# Patient Record
Sex: Female | Born: 1935 | Race: White | Hispanic: No | Smoking: Never smoker
Health system: Southern US, Community
[De-identification: ages and names within clinical notes are randomized; demographics above are authoritative.]

## PROBLEM LIST (undated history)

## (undated) DIAGNOSIS — N183 Chronic kidney disease, stage 3 unspecified (HCC): Principal | ICD-10-CM

## (undated) DIAGNOSIS — T18128A Food in esophagus causing other injury, initial encounter: Secondary | ICD-10-CM

## (undated) DIAGNOSIS — K209 Esophagitis, unspecified without bleeding: Secondary | ICD-10-CM

## (undated) DIAGNOSIS — E785 Hyperlipidemia, unspecified: Secondary | ICD-10-CM

## (undated) DIAGNOSIS — E039 Hypothyroidism, unspecified: Secondary | ICD-10-CM

## (undated) DIAGNOSIS — M159 Polyosteoarthritis, unspecified: Secondary | ICD-10-CM

## (undated) DIAGNOSIS — E1042 Type 1 diabetes mellitus with diabetic polyneuropathy: Principal | ICD-10-CM

## (undated) DIAGNOSIS — R1319 Other dysphagia: Secondary | ICD-10-CM

## (undated) DIAGNOSIS — M150 Primary generalized (osteo)arthritis: Secondary | ICD-10-CM

## (undated) DIAGNOSIS — F32A Depression, unspecified: Secondary | ICD-10-CM

## (undated) DIAGNOSIS — N189 Chronic kidney disease, unspecified: Secondary | ICD-10-CM

## (undated) DIAGNOSIS — Z9181 History of falling: Secondary | ICD-10-CM

## (undated) DIAGNOSIS — H269 Unspecified cataract: Secondary | ICD-10-CM

## (undated) DIAGNOSIS — E119 Type 2 diabetes mellitus without complications: Secondary | ICD-10-CM

## (undated) DIAGNOSIS — Z794 Long term (current) use of insulin: Secondary | ICD-10-CM

## (undated) DIAGNOSIS — I2510 Atherosclerotic heart disease of native coronary artery without angina pectoris: Secondary | ICD-10-CM

## (undated) DIAGNOSIS — G709 Myoneural disorder, unspecified: Secondary | ICD-10-CM

## (undated) DIAGNOSIS — T7840XA Allergy, unspecified, initial encounter: Secondary | ICD-10-CM

## (undated) DIAGNOSIS — Z9989 Dependence on other enabling machines and devices: Secondary | ICD-10-CM

## (undated) DIAGNOSIS — K219 Gastro-esophageal reflux disease without esophagitis: Secondary | ICD-10-CM

## (undated) DIAGNOSIS — D649 Anemia, unspecified: Secondary | ICD-10-CM

## (undated) DIAGNOSIS — IMO0001 Reserved for inherently not codable concepts without codable children: Secondary | ICD-10-CM

## (undated) DIAGNOSIS — M1990 Unspecified osteoarthritis, unspecified site: Secondary | ICD-10-CM

## (undated) DIAGNOSIS — R1310 Dysphagia, unspecified: Secondary | ICD-10-CM

## (undated) DIAGNOSIS — K2210 Ulcer of esophagus without bleeding: Secondary | ICD-10-CM

## (undated) DIAGNOSIS — K559 Vascular disorder of intestine, unspecified: Secondary | ICD-10-CM

## (undated) DIAGNOSIS — I10 Essential (primary) hypertension: Secondary | ICD-10-CM

## (undated) DIAGNOSIS — F329 Major depressive disorder, single episode, unspecified: Secondary | ICD-10-CM

## (undated) DIAGNOSIS — K259 Gastric ulcer, unspecified as acute or chronic, without hemorrhage or perforation: Secondary | ICD-10-CM

## (undated) HISTORY — PX: THYROIDECTOMY: SHX17

## (undated) HISTORY — DX: Reserved for inherently not codable concepts without codable children: IMO0001

## (undated) HISTORY — DX: Vascular disorder of intestine, unspecified: K55.9

## (undated) HISTORY — DX: Hyperlipidemia, unspecified: E78.5

## (undated) HISTORY — DX: Allergy, unspecified, initial encounter: T78.40XA

## (undated) HISTORY — PX: CHOLECYSTECTOMY: SHX55

## (undated) HISTORY — DX: Dysphagia, unspecified: R13.10

## (undated) HISTORY — DX: Depression, unspecified: F32.A

## (undated) HISTORY — DX: Gastric ulcer, unspecified as acute or chronic, without hemorrhage or perforation: K25.9

## (undated) HISTORY — DX: Ulcer of esophagus without bleeding: K22.10

## (undated) HISTORY — DX: Anemia, unspecified: D64.9

## (undated) HISTORY — DX: Unspecified cataract: H26.9

## (undated) HISTORY — DX: Other dysphagia: R13.19

## (undated) HISTORY — DX: Long term (current) use of insulin: Z79.4

## (undated) HISTORY — PX: CATARACT EXTRACTION, BILATERAL: SHX1313

## (undated) HISTORY — PX: ABDOMINAL HYSTERECTOMY: SUR658

## (undated) HISTORY — DX: Myoneural disorder, unspecified: G70.9

## (undated) HISTORY — DX: Atherosclerotic heart disease of native coronary artery without angina pectoris: I25.10

## (undated) HISTORY — PX: UPPER GASTROINTESTINAL ENDOSCOPY: SHX188

## (undated) HISTORY — DX: Type 2 diabetes mellitus without complications: E11.9

## (undated) HISTORY — DX: Major depressive disorder, single episode, unspecified: F32.9

## (undated) HISTORY — PX: GALLBLADDER SURGERY: SHX652

## (undated) HISTORY — DX: Unspecified osteoarthritis, unspecified site: M19.90

## (undated) HISTORY — DX: Essential (primary) hypertension: I10

## (undated) HISTORY — DX: Dependence on other enabling machines and devices: Z99.89

## (undated) HISTORY — DX: Hypothyroidism, unspecified: E03.9

## (undated) HISTORY — DX: History of falling: Z91.81

---

## 2008-01-16 HISTORY — PX: COLONOSCOPY: SHX174

## 2009-11-01 LAB — LIPID PANEL
Chol/HDL Ratio: 3.6 (ref <5.0)
Cholesterol: 213 mg/dL — ABNORMAL HIGH (ref <200)
HDL: 59 mg/dL (ref >40)
LDL Cholesterol: 123 mg/dL — ABNORMAL HIGH (ref <100)
Triglycerides: 157 mg/dL — ABNORMAL HIGH (ref <150)

## 2009-11-01 LAB — HEMOGLOBIN A1C
Estimated Avg Glucose: 194 mg/dL
Hemoglobin A1C: 8.4 % — ABNORMAL HIGH (ref 4.0–6.0)

## 2009-11-01 LAB — ALT: ALT: 14 U/L (ref 4–40)

## 2009-11-01 LAB — AST: AST: 16 U/L (ref 8–36)

## 2009-12-07 LAB — LIPID PANEL
Chol/HDL Ratio: 3.7 (ref <5.0)
Cholesterol: 250 mg/dL — ABNORMAL HIGH (ref <200)
HDL: 67 mg/dL (ref >40)
LDL Cholesterol: 147 mg/dL — ABNORMAL HIGH (ref <100)
Triglycerides: 181 mg/dL — ABNORMAL HIGH (ref <150)

## 2009-12-07 LAB — AST: AST: 18 U/L (ref 8–36)

## 2009-12-07 LAB — ALT: ALT: 14 U/L (ref 4–40)

## 2009-12-08 LAB — HEMOGLOBIN A1C
Estimated Avg Glucose: 171 mg/dL
Hemoglobin A1C: 7.6 % — ABNORMAL HIGH (ref 4.0–6.0)

## 2010-07-18 LAB — TSH: TSH: 0.68 mIU/L (ref 0.35–5.50)

## 2010-07-18 LAB — MICROALBUMIN, UR
Creatinine, Ur: 75.8 mg/dL
Microalb, Ur: <12 mg/L (ref 0–19)
Microalb/Crt. Ratio: UNDETERMINED mcg/mg creat

## 2010-07-18 LAB — COMPREHENSIVE METABOLIC PANEL
ALT: 22 U/L (ref 4–40)
AST: 28 U/L (ref 8–36)
Albumin/Globulin Ratio: 1.7 (ref 1.0–2.7)
Albumin: 4.6 g/dL (ref 3.4–4.8)
Alkaline Phosphatase: 71 U/L (ref 25–100)
Anion Gap: 15 mmol/L (ref 8–16)
BUN: 21 mg/dL — ABNORMAL HIGH (ref 6–20)
CO2: 26 mmol/L (ref 20–31)
Calcium: 9.4 mg/dL (ref 8.6–10.4)
Chloride: 105 mmol/L (ref 98–110)
Creatinine: 1.16 mg/dL — ABNORMAL HIGH (ref 0.4–1.0)
GFR African American: 55 mL/min — ABNORMAL LOW (ref >60)
GFR Non-African American: 45 mL/min — ABNORMAL LOW (ref >60)
Glucose: 181 mg/dL — ABNORMAL HIGH (ref 74–106)
Potassium: 4.9 mmol/L (ref 3.5–5.1)
Protein, Total: 7.3 g/dL (ref 6.4–8.3)
Sodium: 141 mmol/L (ref 136–145)
Total Bilirubin: 0.45 mg/dL (ref 0.3–1.2)

## 2010-07-18 LAB — LIPID PANEL
Chol/HDL Ratio: 4.7 (ref <5.0)
Cholesterol: 252 mg/dL — ABNORMAL HIGH (ref <200)
HDL: 54 mg/dL (ref >40)
LDL Cholesterol: 156 mg/dL — ABNORMAL HIGH (ref <100)
Triglycerides: 212 mg/dL — ABNORMAL HIGH (ref <150)

## 2010-07-18 LAB — CBC
Hematocrit: 38.6 % (ref 36–46)
Hemoglobin: 12.9 g/dL (ref 12.0–16.0)
MCH: 30.4 pg (ref 26–34)
MCHC: 33.5 g/dL (ref 31–37)
MCV: 90.7 fL (ref 80–100)
MPV: 10 fL (ref 6.0–12.0)
Platelets: 256 10*3/uL (ref 140–450)
RBC: 4.26 m/uL (ref 4.0–5.2)
RDW: 13.5 % (ref 12.5–15.4)
WBC: 7.9 10*3/uL (ref 3.5–11.0)

## 2010-07-18 LAB — T4, FREE: Thyroxine, Free: 1.23 ng/dL (ref 0.80–2.00)

## 2010-07-19 LAB — HEMOGLOBIN A1C
Estimated Avg Glucose: 177 mg/dL
Hemoglobin A1C: 7.8 % — ABNORMAL HIGH (ref 4.0–6.0)

## 2011-01-22 LAB — HEPATIC FUNCTION PANEL
ALT: 22 U/L (ref 4–40)
AST: 21 U/L (ref 8–36)
Albumin: 4.5 g/dL (ref 3.4–4.8)
Alkaline Phosphatase: 82 U/L (ref 25–100)
Bilirubin, Direct: 0.11 mg/dL (ref 0.0–0.3)
Bilirubin, Indirect: 0.24 mg/dL (ref 0.0–1.0)
Protein, Total: 7.3 g/dL (ref 6.4–8.3)
Total Bilirubin: 0.35 mg/dL (ref 0.3–1.2)

## 2011-01-23 LAB — HEMOGLOBIN A1C
Estimated Avg Glucose: 177 mg/dL
Hemoglobin A1C: 7.8 % — ABNORMAL HIGH (ref 4.0–6.0)

## 2011-04-04 ENCOUNTER — Inpatient Hospital Stay
Admit: 2011-04-04 | Disposition: A | Discharge status: Home / Self Care | Attending: Internal Medicine | Admitting: Internal Medicine

## 2011-04-04 LAB — CBC WITH AUTO DIFFERENTIAL
Absolute Bands #: 4.55 10*3/uL — ABNORMAL HIGH (ref 0.0–1.0)
Absolute Eos #: 0 10*3/uL (ref 0.0–0.4)
Absolute Lymph #: 1.45 10*3/uL (ref 1.0–4.8)
Absolute Mono #: 0.62 10*3/uL (ref 0.1–1.3)
Absolute Neut #: 14.08 10*3/uL — ABNORMAL HIGH (ref 1.3–9.1)
Bands: 22 % — ABNORMAL HIGH (ref 0–10)
Basophils Absolute: 0 10*3/uL (ref 0.0–0.2)
Basophils: 0 % (ref 0–2)
Eosinophils %: 0 % (ref 0–4)
Hematocrit: 40 % (ref 36–46)
Hemoglobin: 12.8 g/dL (ref 12.0–16.0)
Lymphocytes: 7 % — ABNORMAL LOW (ref 24–44)
MCH: 29.5 pg (ref 26–34)
MCHC: 32.1 g/dL (ref 31–37)
MCV: 92.1 fL (ref 80–100)
MPV: 10.3 fL (ref 6.0–12.0)
Monocytes: 3 % (ref 1–7)
Morphology: NORMAL
Platelets: 255 10*3/uL (ref 150–450)
RBC: 4.35 m/uL (ref 4.0–5.2)
RDW: 13.8 % (ref 11.5–14.9)
Seg Neutrophils: 68 % — ABNORMAL HIGH (ref 36–66)
WBC: 20.7 10*3/uL — ABNORMAL HIGH (ref 3.5–11.0)

## 2011-04-04 LAB — BASIC METABOLIC PANEL
Anion Gap: 13 mmol/L (ref 8–16)
BUN: 41 mg/dL — ABNORMAL HIGH (ref 6–20)
CO2: 22 mmol/L (ref 20–31)
Calcium: 7.5 mg/dL — ABNORMAL LOW (ref 8.6–10.4)
Chloride: 103 mmol/L (ref 98–110)
Creatinine: 1.94 mg/dL — ABNORMAL HIGH (ref 0.4–1.0)
GFR African American: 30 mL/min — ABNORMAL LOW (ref >60)
GFR Non-African American: 25 mL/min — ABNORMAL LOW (ref >60)
Glucose: 437 mg/dL (ref 74–106)
Potassium: 5.1 mmol/L (ref 3.5–5.1)
Sodium: 133 mmol/L — ABNORMAL LOW (ref 136–145)

## 2011-04-04 LAB — BUN & CREATININE
BUN: 43 mg/dL — ABNORMAL HIGH (ref 6–20)
Creatinine: 2.31 mg/dL — ABNORMAL HIGH (ref 0.4–1.0)
GFR African American: 25 mL/min — ABNORMAL LOW (ref >60)
GFR Non-African American: 20 mL/min — ABNORMAL LOW (ref >60)

## 2011-04-04 LAB — HEPATIC FUNCTION PANEL
ALT: 27 U/L (ref 4–40)
AST: 30 U/L (ref 8–36)
Albumin: 4.2 g/dL (ref 3.4–4.8)
Alkaline Phosphatase: 67 U/L (ref 25–100)
Bilirubin, Direct: 0.24 mg/dL (ref 0.0–0.3)
Bilirubin, Indirect: 0.5 mg/dL
Protein, Total: 6.9 g/dL (ref 6.4–8.3)
Total Bilirubin: 0.74 mg/dL (ref 0.3–1.2)

## 2011-04-04 LAB — LIPASE: Lipase: 18 U/L (ref 5.6–51.3)

## 2011-04-04 LAB — ELECTROLYTE PANEL
Anion Gap: 19 mmol/L — ABNORMAL HIGH (ref 8–16)
CO2: 19 mmol/L — ABNORMAL LOW (ref 20–31)
Chloride: 92 mmol/L — ABNORMAL LOW (ref 98–110)
Potassium: 6.1 mmol/L (ref 3.5–5.1)
Sodium: 124 mmol/L — ABNORMAL LOW (ref 136–145)

## 2011-04-04 LAB — AMYLASE: Amylase: 16 U/L — ABNORMAL LOW (ref 20–104)

## 2011-04-04 LAB — GLUCOSE, RANDOM: Glucose: 614 mg/dL (ref 74–106)

## 2011-04-04 LAB — TROP/MYOGLOBIN
Myoglobin: 220 ng/mL — ABNORMAL HIGH (ref 0–110)
Troponin I: 0.01 ng/mL (ref 0.00–0.04)

## 2011-04-04 LAB — POC GLUCOSE FINGERSTICK: Glucose, Whole Blood: 422 mg/dL (ref 65–105)

## 2011-04-05 LAB — POC GLUCOSE FINGERSTICK
Glucose, Whole Blood: 172 mg/dL — ABNORMAL HIGH (ref 65–105)
Glucose, Whole Blood: 292 mg/dL — ABNORMAL HIGH (ref 65–105)
Glucose, Whole Blood: 231 mg/dL — ABNORMAL HIGH (ref 65–105)
Glucose, Whole Blood: 292 mg/dL — ABNORMAL HIGH (ref 65–105)

## 2011-04-05 LAB — CBC WITH AUTO DIFFERENTIAL
Absolute Eos #: 0.1 10*3/uL (ref 0.0–0.4)
Absolute Lymph #: 1.9 10*3/uL (ref 1.0–4.8)
Absolute Mono #: 1.8 10*3/uL — ABNORMAL HIGH (ref 0.1–1.3)
Absolute Neut #: 10.1 10*3/uL — ABNORMAL HIGH (ref 1.3–9.1)
Basophils Absolute: 0 10*3/uL (ref 0.0–0.2)
Basophils: 0 % (ref 0–2)
Eosinophils %: 1 % (ref 0–4)
Hematocrit: 30.4 % — ABNORMAL LOW (ref 36–46)
Hemoglobin: 10 g/dL — ABNORMAL LOW (ref 12.0–16.0)
Lymphocytes: 14 % — ABNORMAL LOW (ref 24–44)
MCH: 30.1 pg (ref 26–34)
MCHC: 32.8 g/dL (ref 31–37)
MCV: 91.7 fL (ref 80–100)
MPV: 9.4 fL (ref 6.0–12.0)
Monocytes: 13 % — ABNORMAL HIGH (ref 1–7)
Platelets: 163 10*3/uL (ref 150–450)
RBC: 3.32 m/uL — ABNORMAL LOW (ref 4.0–5.2)
RDW: 13.7 % (ref 11.5–14.9)
Seg Neutrophils: 72 % — ABNORMAL HIGH (ref 36–66)
WBC: 13.8 10*3/uL — ABNORMAL HIGH (ref 3.5–11.0)

## 2011-04-05 LAB — BASIC METABOLIC PANEL
Anion Gap: 12 mmol/L (ref 8–16)
BUN: 47 mg/dL — ABNORMAL HIGH (ref 6–20)
CO2: 21 mmol/L (ref 20–31)
Calcium: 7.1 mg/dL — ABNORMAL LOW (ref 8.6–10.4)
Chloride: 103 mmol/L (ref 98–110)
Creatinine: 2.1 mg/dL — ABNORMAL HIGH (ref 0.4–1.0)
GFR African American: 27 mL/min — ABNORMAL LOW (ref >60)
GFR Non-African American: 23 mL/min — ABNORMAL LOW (ref >60)
Glucose: 240 mg/dL — ABNORMAL HIGH (ref 74–106)
Potassium: 4.8 mmol/L (ref 3.5–5.1)
Sodium: 131 mmol/L — ABNORMAL LOW (ref 136–145)

## 2011-04-05 LAB — PHOSPHORUS: Phosphorus: 2.9 mg/dL (ref 2.5–4.5)

## 2011-04-05 LAB — MAGNESIUM: Magnesium: 2.2 mg/dL (ref 1.5–2.5)

## 2011-04-05 LAB — LACTIC ACID: Lactic Acid: 1.9 mmol/L (ref 0.7–2.1)

## 2011-04-05 LAB — CALCIUM IONIZED SERUM: Calcium, Ionized: 3.94 mg/dL — ABNORMAL LOW (ref 4.50–5.40)

## 2011-04-06 LAB — C. DIFFICILE TOXIN MOLECULAR

## 2011-04-06 LAB — BASIC METABOLIC PANEL
Anion Gap: 7 mmol/L — ABNORMAL LOW (ref 8–16)
BUN: 42 mg/dL — ABNORMAL HIGH (ref 6–20)
CO2: 23 mmol/L (ref 20–31)
Calcium: 7.2 mg/dL — ABNORMAL LOW (ref 8.6–10.4)
Chloride: 110 mmol/L (ref 98–110)
Creatinine: 1.31 mg/dL — ABNORMAL HIGH (ref 0.4–1.0)
GFR African American: 47 mL/min — ABNORMAL LOW (ref >60)
GFR Non-African American: 39 mL/min — ABNORMAL LOW (ref >60)
Glucose: 205 mg/dL — ABNORMAL HIGH (ref 74–106)
Potassium: 4.6 mmol/L (ref 3.5–5.1)
Sodium: 135 mmol/L — ABNORMAL LOW (ref 136–145)

## 2011-04-06 LAB — CBC
Hematocrit: 28.9 % — ABNORMAL LOW (ref 36–46)
Hemoglobin: 9.6 g/dL — ABNORMAL LOW (ref 12.0–16.0)
MCH: 30.5 pg (ref 26–34)
MCHC: 33.1 g/dL (ref 31–37)
MCV: 92.1 fL (ref 80–100)
MPV: 9.4 fL (ref 6.0–12.0)
Platelets: 134 10*3/uL — ABNORMAL LOW (ref 150–450)
RBC: 3.14 m/uL — ABNORMAL LOW (ref 4.0–5.2)
RDW: 14 % (ref 11.5–14.9)
WBC: 9 10*3/uL (ref 3.5–11.0)

## 2011-04-06 LAB — POC GLUCOSE FINGERSTICK
Glucose, Whole Blood: 273 mg/dL — ABNORMAL HIGH (ref 65–105)
Glucose, Whole Blood: 234 mg/dL — ABNORMAL HIGH (ref 65–105)
Glucose, Whole Blood: 204 mg/dL — ABNORMAL HIGH (ref 65–105)
Glucose, Whole Blood: 210 mg/dL — ABNORMAL HIGH (ref 65–105)

## 2011-04-06 LAB — VITAMIN B12 & FOLATE
Folate: 12.8 ug/L (ref 5.38–24.00)
Vitamin B-12: 699 ng/L (ref 211–911)

## 2011-04-06 LAB — FECAL LEUKOCYTES: Direct Exam: NONE SEEN — AB

## 2011-04-06 LAB — IRON AND TIBC
Iron Saturation: 2 % — ABNORMAL LOW (ref 20–55)
Iron: 4 ug/dL — ABNORMAL LOW (ref 50–170)
TIBC: 245 ug/dL (ref 240–450)

## 2011-04-06 LAB — VITAMIN D 25 HYDROXY: Vit D, 25-Hydroxy: 29 ng/mL — ABNORMAL LOW (ref 30–100)

## 2011-04-06 LAB — RETICULOCYTES
Absolute Retic #: 0.049 M/uL (ref 0.0245–0.098)
Retic %: 1.5 % (ref 0.5–2.0)

## 2011-04-06 LAB — MAGNESIUM: Magnesium: 2.2 mg/dL (ref 1.5–2.5)

## 2011-04-06 LAB — FERRITIN: Ferritin: 99 ug/L (ref 10–291)

## 2011-04-06 LAB — PERIPHERAL BLOOD SMEAR, PATH REVIEW

## 2011-04-07 LAB — CBC
Hematocrit: 30.1 % — ABNORMAL LOW (ref 36–46)
Hemoglobin: 10.1 g/dL — ABNORMAL LOW (ref 12.0–16.0)
MCH: 30.7 pg (ref 26–34)
MCHC: 33.7 g/dL (ref 31–37)
MCV: 91.2 fL (ref 80–100)
MPV: 9.4 fL (ref 6.0–12.0)
Platelets: 165 10*3/uL (ref 150–450)
RBC: 3.3 m/uL — ABNORMAL LOW (ref 4.0–5.2)
RDW: 13.7 % (ref 11.5–14.9)
WBC: 9.6 10*3/uL (ref 3.5–11.0)

## 2011-04-07 LAB — BASIC METABOLIC PANEL
Anion Gap: 8 mmol/L (ref 8–16)
BUN: 19 mg/dL (ref 6–20)
CO2: 23 mmol/L (ref 20–31)
Calcium: 7.4 mg/dL — ABNORMAL LOW (ref 8.6–10.4)
Chloride: 112 mmol/L — ABNORMAL HIGH (ref 98–110)
Creatinine: 0.83 mg/dL (ref 0.4–1.0)
GFR African American: >60 mL/min (ref >60)
GFR Non-African American: >60 mL/min (ref >60)
Glucose: 137 mg/dL — ABNORMAL HIGH (ref 74–106)
Potassium: 4.2 mmol/L (ref 3.5–5.1)
Sodium: 139 mmol/L (ref 136–145)

## 2011-04-07 LAB — STOOL CULTURE: Culture: NEGATIVE — AB

## 2011-04-07 LAB — POC GLUCOSE FINGERSTICK
Glucose, Whole Blood: 186 mg/dL — ABNORMAL HIGH (ref 65–105)
Glucose, Whole Blood: 154 mg/dL — ABNORMAL HIGH (ref 65–105)
Glucose, Whole Blood: 144 mg/dL — ABNORMAL HIGH (ref 65–105)

## 2011-04-07 LAB — MAGNESIUM: Magnesium: 2 mg/dL (ref 1.5–2.5)

## 2011-04-09 LAB — VITAMIN D 1,25 DIHYDROXY: Vit D, 1,25-Dihydroxy: 44 pg/mL (ref 15–75)

## 2011-04-10 LAB — IBD PANEL

## 2011-04-17 LAB — HEMOGLOBIN A1C
Estimated Avg Glucose: 192 mg/dL
Hemoglobin A1C: 8.3 % — ABNORMAL HIGH (ref 4.0–6.0)

## 2011-08-29 NOTE — Progress Notes (Signed)
Subjective:      Patient ID: Kristen Gill is a 78 y.o. female.    HPI  This is an 78 year old patient seen in the office to evaluate and manage dysphagia.  On reviewing her records, it looks like the patient was seen by Dr. Geronimo Boot in March of 2013 at [facility] to evaluate colitis.  I did discuss with the patient that Dr. Geronimo Boot is available to see her and I encouraged her to see him given that he knows this patient in the last six months.  However, after discussion, the patient stated that she prefers to change the physician and she is comfortable with me.    On further discussion, it appears that when she eats solid food, the food gets stuck in the substernal area.  She has to drink plenty of liquids to get relief from this discomfort.  The patient has the symptoms in the last three to four months and this is progressively getting worse.  She denies epigastric pain or nausea.  No abdominal distention, bloating, etc.  She denies diarrhea at present.  In fact, the patient states that she has constipation and she has been taking stool softeners.  No weight loss.  She also denies GERD symptoms.  Her last EGD and dilatation appears to be three years ago.  Her other medical history reviewed.  Also to be noted, the patient does not have symptoms or diagnosis of scleroderma.  However, she does have diabetes and hypothyroidism.    Review of Systems   Constitutional: Negative for fever, activity change, appetite change and unexpected weight change.   HENT: Positive for trouble swallowing.    Respiratory: Negative for cough and shortness of breath.    Cardiovascular: Negative for chest pain and leg swelling.   Gastrointestinal: Positive for constipation and blood in stool. Negative for nausea, vomiting, abdominal pain, diarrhea and abdominal distention.   Genitourinary: Negative for flank pain and difficulty urinating.   Musculoskeletal: Negative for back pain.   Skin: Negative for pallor and rash.    Neurological: Negative for weakness and headaches.   Hematological: Negative for adenopathy. Does not bruise/bleed easily.   Psychiatric/Behavioral: The patient is not nervous/anxious.        Objective:   Physical Exam   Nursing note and vitals reviewed.  Constitutional: She appears well-nourished.   HENT:   Head: Normocephalic and atraumatic.   Eyes: Conjunctivae are normal. Pupils are equal, round, and reactive to light. No scleral icterus.   Neck: No tracheal deviation present.   Cardiovascular: Normal rate and regular rhythm.    No murmur heard.  Pulmonary/Chest: Effort normal. She has no wheezes. She has no rales.   Abdominal: Soft. Bowel sounds are normal. She exhibits no distension and no ascites. There is no hepatomegaly. There is no tenderness. No hernia.   Genitourinary: Guaiac negative stool.   Musculoskeletal: She exhibits no edema.   Lymphadenopathy:     She has no cervical adenopathy.   Neurological: She is alert.   Skin: No erythema.   Psychiatric: Thought content normal.       Assessment:      1. Dysphasia  PR UPPER GI ENDOSCOPY,DIAGNOSIS   2. Ischemic colitis               Plan:      Given the patient's history, past history of esophageal dilation, need to evaluate esophagus for the possibility of stricture, ulcers, or Barrett's mucosa.  The patient was explained these and  she requests the investigation.  This will be arranged.  Basing on the results, we will decide further management.At present colitis symptoms resoved . Has mild constipation. Medical management of constipation discussed.

## 2011-09-12 LAB — POC GLUCOSE FINGERSTICK: POC Glucose: 103 mg/dL (ref 65–105)

## 2011-09-13 LAB — SURGICAL PATHOLOGY

## 2011-09-13 MED ORDER — OMEPRAZOLE 20 MG PO CPDR
20 | ORAL_CAPSULE | Freq: Every day | ORAL | Status: DC
Start: 2011-09-13 — End: 2014-05-31

## 2011-09-13 NOTE — Telephone Encounter (Signed)
Per verbal order of Dr Murtis SinkPangulur prilosec order sent to pharmacy.

## 2011-09-26 LAB — COMPREHENSIVE METABOLIC PANEL
ALT: 20 U/L (ref 4–40)
AST: 17 U/L (ref 8–36)
Albumin/Globulin Ratio: 1.6 (ref 1.0–2.7)
Albumin: 4.4 g/dL (ref 3.4–4.8)
Alkaline Phosphatase: 74 U/L (ref 25–100)
Anion Gap: 14 mmol/L (ref 8–16)
BUN: 25 mg/dL — ABNORMAL HIGH (ref 6–20)
CO2: 29 mmol/L (ref 20–31)
Calcium: 9.5 mg/dL (ref 8.6–10.4)
Chloride: 104 mmol/L (ref 98–110)
Creatinine: 1.22 mg/dL — ABNORMAL HIGH (ref 0.4–1.0)
GFR African American: 51 mL/min — ABNORMAL LOW (ref >60)
GFR Non-African American: 42 mL/min — ABNORMAL LOW (ref >60)
Glucose: 243 mg/dL — ABNORMAL HIGH (ref 74–106)
Potassium: 4.9 mmol/L (ref 3.5–5.1)
Sodium: 142 mmol/L (ref 136–145)
Total Bilirubin: 0.51 mg/dL (ref 0.3–1.2)
Total Protein: 7.1 g/dL (ref 6.4–8.3)

## 2011-09-26 LAB — CBC
Hematocrit: 37.7 % (ref 36–46)
Hemoglobin: 12.6 g/dL (ref 12.0–16.0)
MCH: 29.2 pg (ref 26–34)
MCHC: 33.6 g/dL (ref 31–37)
MCV: 87.1 fL (ref 80–100)
MPV: 9.6 fL (ref 6.0–12.0)
Platelets: 236 10*3/uL (ref 140–450)
RBC: 4.32 m/uL (ref 4.0–5.2)
RDW: 14 % (ref 12.5–15.4)
WBC: 6.9 10*3/uL (ref 3.5–11.0)

## 2011-09-26 LAB — HEMOGLOBIN A1C
Estimated Avg Glucose: 183 mg/dL
Hemoglobin A1C: 8 % — ABNORMAL HIGH (ref 4.0–6.0)

## 2011-10-16 NOTE — Progress Notes (Signed)
Subjective:      Patient ID: Kristen Gill is a 78 y.o. female.    HPI   This is an 78 year old patient seen in the office with the symptoms of dysphagia.  To evaluate her symptoms, she had an EGD done and at that time she also had Maloney dilation of the esophagus up to 50 [place] dilator.  I am not able to document pathology in the esophagus.  Also, gastric biopsies and esophageal biopsies are unremarkable.  No evidence of eosinophilic esophagitis noted.  Following this procedure, at present the patient states that she is doing reasonably well.  She is able to swallow food fairly well.  Her GERD symptoms are better.  Denies abdominal pain.  She has satisfactory bowel movements.  No weight loss.    Review of Systems   Constitutional: Negative for fever, activity change, fatigue and unexpected weight change.   HENT: Negative for sore throat, trouble swallowing (better) and voice change.    Eyes: Negative.    Respiratory: Negative for cough, choking, shortness of breath and wheezing.    Cardiovascular: Negative for chest pain, palpitations and leg swelling.   Gastrointestinal: Negative for nausea, vomiting, abdominal pain and abdominal distention.   Genitourinary: Positive for flank pain. Negative for urgency, frequency, hematuria, vaginal bleeding, difficulty urinating and pelvic pain.   Musculoskeletal: Negative for back pain, joint swelling, arthralgias and gait problem.   Skin: Negative for color change, pallor and rash.   Neurological: Negative for dizziness, seizures, weakness, light-headedness and headaches.   Hematological: Negative for adenopathy. Does not bruise/bleed easily.   Psychiatric/Behavioral: Negative for confusion, sleep disturbance and agitation.       Objective:   Physical Exam   Nursing note and vitals reviewed.  Constitutional: She is oriented to person, place, and time. She appears well-developed and well-nourished.   HENT:   Head: Normocephalic and atraumatic.   No oral lesions   Eyes: No  scleral icterus.   Neck: Neck supple. No hepatojugular reflux and no JVD present. No thyromegaly present.   Cardiovascular: Normal rate, regular rhythm and normal heart sounds.    Pulmonary/Chest: Effort normal and breath sounds normal. She has no wheezes. She has no rales.   Abdominal: Soft. Bowel sounds are normal. She exhibits no distension and no mass. There is no tenderness.   Musculoskeletal: She exhibits no edema and no tenderness.   No joint swelling   Lymphadenopathy:     She has no cervical adenopathy.   Neurological: She is alert and oriented to person, place, and time.   Skin: Skin is warm. No bruising, no ecchymosis and no rash noted.       Assessment:      1. Abdominal pain, epigastric    2. Dysphagia    3. Gastric ulcer            Plan:      The patient was reassured.  Advised to continue PPI therapy and antireflux measures.  The patient is advised to see family physician for follow up.  If she has any further issues, she is advised to contact me.  The patient understood and agreed.

## 2012-01-26 LAB — LIPID PANEL
Chol/HDL Ratio: 3.6 (ref <5.0)
Cholesterol: 190 mg/dL (ref <200)
HDL: 53 mg/dL (ref >40)
LDL Cholesterol: 92 mg/dL (ref <100)
Triglycerides: 223 mg/dL — ABNORMAL HIGH (ref <150)

## 2012-01-26 LAB — TSH: TSH: 2.24 mIU/L (ref 0.35–5.50)

## 2012-01-26 LAB — T4, FREE: Thyroxine, Free: 1.18 ng/dL (ref 0.80–2.00)

## 2012-02-01 MED ORDER — AMITRIPTYLINE HCL 75 MG PO TABS
75 MG | ORAL_TABLET | Freq: Two times a day (BID) | ORAL | Status: DC
Start: 2012-02-01 — End: 2012-04-16

## 2012-02-01 MED ORDER — TRAMADOL HCL 50 MG PO TABS
50 MG | ORAL_TABLET | Freq: Four times a day (QID) | ORAL | Status: AC | PRN
Start: 2012-02-01 — End: 2012-03-02

## 2012-02-01 NOTE — Progress Notes (Signed)
[facility]  Internal Medicine History and Physical      Name: Kristen Gill  MRN: [id number]     PCP: Marjory Sneddon, CNP    Chief Complaint:     Chief Complaint   Patient presents with   . Check-Up     very fatigued lately, has arthritis in knees now       History Obtained From:     patient    History of Present Illness:      Kristen Gill is a Caucasian 79 y.o.  female who presents with OA knees. Stopped taking Diclofenac. Thought she called for refill, but nothing documented from pharmacy or in chart note. She has fatigue, but increased her elavil back to 75mg  bid for her depression and feels better on that dosage. Reviewed last labs. Encouraged DM diet. Encouraged getting TSH and HgbA1C checked. No fever, chills, no recent illness. Agreed to pheumovax and tdap today. No other pains, except chronic DM neuropathy. Remained on 32 units of NPH and does 10 units sliding scale regular insulin Humulin in the evening only. Does not consistently check blood sugars.     Location/Symptom: knee pain  Timing/Onset: chronic  Provocation: unsure  Quality: dull  Radiation: none  Severity: moderate  Timing/Duration: daily, waxes and wanes    Modifying Factors: diclofenac helped but stopped taking.    Past Medical History:     Past Medical History   Diagnosis Date   . IDDM (insulin dependent diabetes mellitus)    . Hyperlipidemia    . Hypertension    . Osteoarthritis    . Depression    . Hypothyroidism         Past Surgical History:     Past Surgical History   Procedure Laterality Date   . Hysterectomy     . Cholecystectomy     . Thyroidectomy     . Colonoscopy  04/14/2008     severe ischemic colitis   . Colonoscopy  06/03/2008     redundant colon, a lot of adhesions, poor prep, diverticula   . Upper gastrointestinal endoscopy  2010     dilation   . Upper gastrointestinal endoscopy  10/14/2008     esophageal lesion, biopsy shows inflammatory changes   . Upper gastrointestinal endoscopy  09/12/2011     small  ulcer upper part of stomach, reactive gastropathy        Medications Prior to Admission:       Prior to Admission medications    Medication Sig Start Date End Date Taking? Authorizing Provider   amitriptyline (ELAVIL) 75 MG tablet Take 1 tablet by mouth 2 times daily. 02/01/12  Yes Marjory Sneddon, CNP   aspirin 325 MG tablet Take 325 mg by mouth daily.   Yes Historical Provider, MD   ascorbic acid (VITAMIN C) 500 MG tablet Take 500 mg by mouth daily.   Yes Historical Provider, MD   Omega-3 Fatty Acids (OMEGA-3 FISH OIL PO) Take  by mouth.   Yes Historical Provider, MD   CALCIUM & MAGNESIUM CARBONATES PO Take  by mouth daily.   Yes Historical Provider, MD   ALOE VERA JUICE PO Take 2 oz by mouth 2 times daily.   Yes Historical Provider, MD   lisinopril (PRINIVIL;ZESTRIL) 10 MG tablet Take 10 mg by mouth daily.   Yes Historical Provider, MD   levothyroxine (SYNTHROID) 150 MCG tablet Take 150 mcg by mouth Daily.   Yes Historical Provider, MD   DICLOFENAC  POTASSIUM PO Take 75 mg by mouth daily.   Yes Historical Provider, MD   insulin regular (HUMULIN R;NOVOLIN R) 100 UNIT/ML injection Inject  into the skin 2 times daily (before meals).   Yes Historical Provider, MD   insulin NPH (HUMULIN N) 100 UNIT/ML injection Inject 32 Units into the skin 2 times daily (before meals).   Yes Historical Provider, MD   Lactobacillus CHEW Take 1 tablet by mouth 3 times daily.    Historical Provider, MD   Cyanocobalamin ([facility] B-12 PO) Take  by mouth.    Historical Provider, MD   omeprazole (PRILOSEC) 20 MG capsule Take 1 capsule by mouth Daily. 09/13/11   Marguerita Beards Pangulur, MD   simvastatin (ZOCOR) 40 MG tablet Take 40 mg by mouth nightly.    Historical Provider, MD        Allergies:       Pravastatin    Social History:     Tobacco:    reports that she has never smoked. She has never used smokeless tobacco.  Alcohol:      reports that she does not drink alcohol.  Drug Use:  reports that she does not use illicit drugs.    Family  History:     Family History   Problem Relation Age of Onset   . Other Mother      died at age 37 of old age   . Heart Disease Father    . Cancer Sister      lung   . Cancer Brother    . Birth Defects Brother      unknown site           Review of Systems:       Positive and Negative as described in HPI    Constitutional:  negative for  fevers, chills, sweats, positive for fatigue, no weight loss  HEENT:  negative for vision or hearing changes,   Respiratory:  negative for shortness of breath, cough, or congestion  Cardiovascular:  negative for  chest pain, palpitations, edema  Gastrointestinal:  negative for nausea, vomiting, diarrhea, constipation, abdominal pain  Genitourinary:  negative for frequency, dysuria  Integument/Breast:  negative for rash, skin lesions, wounds  Musculoskeletal:  negative for muscle aches or joint pain, strength equal  Neurological:  negative for headaches, dizziness, lightheadedness, numbness,positive for pain and tingling extremities secondary to DM 2  Behavior/Psych: positive for depression , no anxiety      Physical Exam:     Vitals:  BP 130/48  Pulse 78  Resp 16  Ht 5\' 5"  (1.651 m)  Wt 162 lb (73.483 kg)  BMI 26.96 kg/m2    General appearance - alert, well appearing, and in no acute distress  Mental status - alert, oriented to person, place, and time  Head - normocephalic and atraumatic  Eyes - pupils equal and reactive, extraocular eye movements intact, no sclera icterus, no nystagmus  Ears - hearing appears to be intact, TM's clear, no drainage  Nose - no drainage noted, mucous membranes moist  Mouth - mucous membranes moist, pharynx pink  Neck - supple, no carotid bruits, thyroid not palpable, no lymphadenopathy  Chest - clear to auscultation, no wheezes, rales or rhonchi  Heart - normal rate, regular rhythm, no murmurs, no gallops, no rubs  Abdomen - soft, nontender, nondistended, no masses or organomegaly  Neurological - alert, oriented, normal speech, no focal findings or  movement disorder noted, cranial nerves II through XII grossly intact  Extremities -  peripheral pulses palpable, no pedal edema or calf pain with palpation, no cyanosis, clubbing, or edema  Skin - no gross lesions or rashes, no open wounds      Data:     Reviewed labs    Assesment:     Primary Problem  Problem List Items Addressed This Visit    Depression (Chronic)    Relevant Medications       amitriptyline (ELAVIL) tablet    Hypothyroidism (Chronic)    CKD (chronic kidney disease) stage 3, GFR 30-59 ml/min (Chronic)    Hypertension      Other Visit Diagnoses    OA (osteoarthritis) of knee    -  Primary     Neuropathy associated with endocrine disorder         Fatigue         Need for pneumococcal vaccination         Need for Tdap vaccination               Plan:     1. Ok for tramadol prn if tylenol does not work for arthritis pain  2. Ok to continue elavil as she was doing originally, does not have any issues with increased dosage and no confusion with this  3. Encouraged patient to monitor blood sugars more carefully will recheck hgbA1C  4. Check TSH for hypothyroidism      Electronically signed by Marjory Sneddon, CNP on 02/01/2012 at 1:25 PM     Copy sent to Dr. Marjory Sneddon, CNP

## 2012-02-01 NOTE — Addendum Note (Signed)
Addended by: Kathrene Bongo, Tresa Endo on: 02/01/2012 04:20 PM     Modules accepted: Orders

## 2012-04-21 MED ORDER — TRAMADOL HCL 50 MG PO TABS
50 MG | ORAL_TABLET | Freq: Four times a day (QID) | ORAL | Status: DC | PRN
Start: 2012-04-21 — End: 2013-10-26

## 2012-04-21 MED ORDER — AMITRIPTYLINE HCL 75 MG PO TABS
75 MG | ORAL_TABLET | Freq: Two times a day (BID) | ORAL | Status: DC
Start: 2012-04-21 — End: 2012-09-25

## 2012-06-16 MED ORDER — LISINOPRIL 10 MG PO TABS
10 MG | ORAL_TABLET | ORAL | Status: DC
Start: 2012-06-16 — End: 2013-09-06

## 2012-08-01 MED ORDER — INSULIN REGULAR HUMAN 100 UNIT/ML IJ SOLN
100 UNIT/ML | INTRAMUSCULAR | Status: DC
Start: 2012-08-01 — End: 2015-08-26

## 2012-08-01 NOTE — Telephone Encounter (Signed)
Pharmacy requesting refill on Humulin R-will e-scribe

## 2012-08-21 LAB — TSH: TSH: 2.08 mIU/L (ref 0.30–5.00)

## 2012-08-22 LAB — HEMOGLOBIN A1C
Estimated Avg Glucose: 186 mg/dL
Hemoglobin A1C: 8.1 % — ABNORMAL HIGH (ref 4.0–6.0)

## 2012-08-26 NOTE — Progress Notes (Signed)
[facility]  Internal Medicine History and Physical      Name: Kristen Gill  MRN: [id number]     PCP: Marjory Sneddon, CNP    Chief Complaint:     Chief Complaint   Patient presents with   . Diabetes   . Hypertension   . Hyperlipidemia   . Knee Pain     R knee pain; had cortisone injection; it did not work; she is following up with [facility]       History Obtained From:     patient    History of Present Illness:      Kristen Gill is a Caucasian 79 y.o.  female who presents with routine follow up visit for DM2, HTN, HLD, DM neuropathy, R knee pain. She reports that she had a cortisone injection for the knee pain and it did not work well. She has an appointment to follow up with [facility] (ortho) for her knee. Discussed her diabetes and working on getting her HgbA1c down a little by adjusting medication, however, she is hesitant to change her routine for fear of hypoglycemia and declined a long-acting insulin. Her HTN is stable on medication, her HLD is stable on medication. She is taking elavil for her diabetic neuropathy and reports there is no worsening of symptoms. Denies CP, SOB, N.V.D. No fever, chills.       Past Medical History:     Past Medical History   Diagnosis Date   . IDDM (insulin dependent diabetes mellitus) (HCC)    . Hyperlipidemia    . Hypertension    . Osteoarthritis    . Depression    . Hypothyroidism         Past Surgical History:     Past Surgical History   Procedure Laterality Date   . Hysterectomy     . Cholecystectomy     . Thyroidectomy     . Colonoscopy  04/14/2008     severe ischemic colitis   . Colonoscopy  06/03/2008     redundant colon, a lot of adhesions, poor prep, diverticula   . Upper gastrointestinal endoscopy  2010     dilation   . Upper gastrointestinal endoscopy  10/14/2008     esophageal lesion, biopsy shows inflammatory changes   . Upper gastrointestinal endoscopy  09/12/2011     small ulcer upper part of stomach, reactive gastropathy        Medications  Prior to Admission:       Prior to Admission medications    Medication Sig Start Date End Date Taking? Authorizing Provider   insulin regular (HUMULIN R) 100 UNIT/ML injection Every evening with largest meal 08/01/12   Georjean Mode, CNP   lisinopril (PRINIVIL;ZESTRIL) 10 MG tablet TAKE 1 TABLET DAILY 06/14/12   Georjean Mode, CNP   amitriptyline (ELAVIL) 75 MG tablet Take 1 tablet by mouth 2 times daily. 04/16/12   Georjean Mode, CNP   traMADol (ULTRAM) 50 MG tablet Take 1 tablet by mouth every 6 hours as needed for Pain. 04/16/12   Georjean Mode, CNP   aspirin 325 MG tablet Take 325 mg by mouth daily.    Historical Provider, MD   ascorbic acid (VITAMIN C) 500 MG tablet Take 500 mg by mouth daily.    Historical Provider, MD   Omega-3 Fatty Acids (OMEGA-3 FISH OIL PO) Take  by mouth.    Historical Provider, MD   CALCIUM & MAGNESIUM CARBONATES PO Take  by mouth daily.  Historical Provider, MD   ALOE VERA JUICE PO Take 2 oz by mouth 2 times daily.    Historical Provider, MD   Lactobacillus CHEW Take 1 tablet by mouth 3 times daily.    Historical Provider, MD   Cyanocobalamin (CVS B-12 PO) Take  by mouth.    Historical Provider, MD   omeprazole (PRILOSEC) 20 MG capsule Take 1 capsule by mouth Daily. 09/13/11   Marguerita Beards Pangulur, MD   simvastatin (ZOCOR) 40 MG tablet Take 40 mg by mouth nightly.    Historical Provider, MD   levothyroxine (SYNTHROID) 150 MCG tablet Take 150 mcg by mouth Daily.    Historical Provider, MD   DICLOFENAC POTASSIUM PO Take 75 mg by mouth daily.    Historical Provider, MD   insulin NPH (HUMULIN N) 100 UNIT/ML injection Inject 32 Units into the skin 2 times daily (before meals).    Historical Provider, MD        Allergies:       Pravastatin    Social History:     Tobacco:    reports that she has never smoked. She has never used smokeless tobacco.  Alcohol:      reports that she does not drink alcohol.  Drug Use:  reports that she does not use illicit drugs.    Family History:     Family History    Problem Relation Age of Onset   . Other Mother      died at age 55 of old age   . Heart Disease Father    . Cancer Sister      lung   . Cancer Brother    . Birth Defects Brother      unknown site           Review of Systems:       Positive and Negative as described in HPI    Constitutional:  negative for  fevers, chills, sweats, fatigue, and weight loss  HEENT:  negative for vision or hearing changes,   Respiratory:  negative for shortness of breath, cough, or congestion  Cardiovascular:  negative for  chest pain, palpitations, edema  Gastrointestinal:  negative for nausea, vomiting, diarrhea, constipation, abdominal pain  Genitourinary:  negative for frequency, dysuria  Integument/Breast:  negative for rash, skin lesions, wounds  Musculoskeletal:  Positive for Right knee pain, strength equal  Neurological:  negative for headaches,chronic dizziness, lightheadedness, positive for chronic numbness, pain and tingling extremities  Behavior/Psych:  negative for depression and anxiety      Physical Exam:     Vitals:  BP 122/82  Pulse 72  Resp 20  Ht 5\' 4"  (1.626 m)  Wt 163 lb 9.6 oz (74.208 kg)  BMI 28.07 kg/m2  Breastfeeding? No    General appearance - alert, well appearing, and in no acute distress  Mental status - alert, oriented to person, place, and time  Head - normocephalic and atraumatic  Eyes - pupils equal and reactive, extraocular eye movements intact, no sclera icterus, no nystagmus  Ears - hearing appears to be intact, TM's clear, no drainage  Nose - no drainage noted, mucous membranes moist  Mouth - mucous membranes moist, pharynx pink  Neck - supple, no carotid bruits, thyroid not palpable, no lymphadenopathy  Chest - clear to auscultation, no wheezes, rales or rhonchi  Heart - normal rate, regular rhythm, no murmurs, no gallops, no rubs  Abdomen - soft, nontender, nondistended, no masses or organomegaly  Neurological - alert, oriented, normal  speech, no focal findings or movement disorder noted,  cranial nerves II through XII grossly intact  Extremities - peripheral pulses palpable, no pedal edema or calf pain with palpation, no cyanosis, clubbing, or edema  Skin - no gross lesions or rashes, no open wounds      Data:     Reviewed labs; hgb A1C 8.1    Assesment:     Primary Problem  Problem List Items Addressed This Visit    IDDM (insulin dependent diabetes mellitus) (HCC) - Primary (Chronic)    Osteoarthritis (Chronic)    Hypothyroidism (Chronic)    Hyperlipidemia    Hypertension      Other Visit Diagnoses    Knee pain, chronic, right         DM neuropathy, type II diabetes mellitus (HCC)               Plan:     1. Discussed watching diet and keeping log; avoiding concentrated sweets for better DM control  2. Refills for medications  3. Follow up with ortho (has appointment already)  4. Encouraged regular skin, eye, foot exams.   5. Discussed checking HgbA1c in about 3-4 months      Electronically signed by Marjory Sneddon, CNP on 08/26/2012 at 7:51 PM     Copy sent to Dr. Marjory Sneddon, CNP

## 2012-09-16 MED ORDER — SIMVASTATIN 40 MG PO TABS
40 MG | ORAL_TABLET | Freq: Every evening | ORAL | Status: DC
Start: 2012-09-16 — End: 2013-09-11

## 2012-09-16 NOTE — Telephone Encounter (Signed)
Pharmacy requests refill of Zocor-will e-scribe

## 2012-09-26 MED ORDER — AMITRIPTYLINE HCL 75 MG PO TABS
75 MG | ORAL_TABLET | ORAL | Status: DC
Start: 2012-09-26 — End: 2013-06-22

## 2013-03-10 NOTE — Telephone Encounter (Signed)
Please see health maintenance letter to patient

## 2013-04-09 MED ORDER — BD INSULIN SYRINGE U/F 30G X 1/2" 1 ML MISC
Status: DC
Start: 2013-04-09 — End: 2013-06-15

## 2013-06-15 MED ORDER — INSULIN SYRINGE-NEEDLE U-100 30G X 1/2" 1 ML MISC
Freq: Two times a day (BID) | Status: DC
Start: 2013-06-15 — End: 2013-09-13

## 2013-06-15 NOTE — Telephone Encounter (Signed)
Pharmacy requests refill of insulin syringes-will e-scribe.

## 2013-06-22 MED ORDER — AMITRIPTYLINE HCL 75 MG PO TABS
75 MG | ORAL_TABLET | ORAL | Status: DC
Start: 2013-06-22 — End: 2013-10-26

## 2013-08-28 NOTE — Telephone Encounter (Signed)
Last seen Aug 2014

## 2013-09-02 NOTE — Telephone Encounter (Signed)
Letter mailed to pt to schedule appt

## 2013-09-02 NOTE — Telephone Encounter (Signed)
msg left on pt v/m to return our call to schedule appt for medication refill

## 2013-09-03 MED ORDER — INSULIN NPH (HUMAN) (ISOPHANE) 100 UNIT/ML SC SUSP
100 UNIT/ML | Freq: Two times a day (BID) | SUBCUTANEOUS | Status: DC
Start: 2013-09-03 — End: 2013-09-04

## 2013-09-03 NOTE — Telephone Encounter (Signed)
Pharmacy request refill on Humulin will e scribe

## 2013-09-04 MED ORDER — INSULIN NPH (HUMAN) (ISOPHANE) 100 UNIT/ML SC SUSP
100 UNIT/ML | Freq: Two times a day (BID) | SUBCUTANEOUS | Status: DC
Start: 2013-09-04 — End: 2013-10-26

## 2013-09-07 MED ORDER — LISINOPRIL 10 MG PO TABS
10 MG | ORAL_TABLET | Freq: Every day | ORAL | Status: DC
Start: 2013-09-07 — End: 2013-10-26

## 2013-09-07 NOTE — Telephone Encounter (Signed)
Pharmacy requests refill of lisinopril, last office visit 08-25-12, approve or deny.

## 2013-09-08 NOTE — Telephone Encounter (Signed)
Kristen Gill from [facility] calling to verify insulin order they received 09-03-13 per e-scribe from this office. Order read humulin n 100 units bid, pharmacy questioning dose and amount of vials. Writer told pharmacy patient needs to call office, she has not been seen since 08-25-12. Writer would not verify prescription.

## 2013-09-11 MED ORDER — SIMVASTATIN 40 MG PO TABS
40 MG | ORAL_TABLET | ORAL | Status: DC
Start: 2013-09-11 — End: 2013-10-26

## 2013-09-11 NOTE — Telephone Encounter (Signed)
Approved 30 day supply, pt needs appt and LV 08/28/12

## 2013-09-14 MED ORDER — INSULIN SYRINGE-NEEDLE U-100 30G X 1/2" 1 ML MISC
Freq: Two times a day (BID) | Status: DC
Start: 2013-09-14 — End: 2013-12-12

## 2013-09-14 NOTE — Telephone Encounter (Signed)
Pharmacy requests refill of insulin syringes.

## 2013-09-15 NOTE — Telephone Encounter (Signed)
Last seen 08/2012

## 2013-09-22 MED ORDER — LEVOTHYROXINE SODIUM 150 MCG PO TABS
150 MCG | ORAL_TABLET | ORAL | Status: DC
Start: 2013-09-22 — End: 2013-10-26

## 2013-10-26 ENCOUNTER — Ambulatory Visit: Admit: 2013-10-26 | Discharge: 2013-10-26 | Payer: MEDICARE | Attending: Family | Primary: Family

## 2013-10-26 DIAGNOSIS — IMO0001 Reserved for inherently not codable concepts without codable children: Secondary | ICD-10-CM

## 2013-10-26 MED ORDER — AMITRIPTYLINE HCL 75 MG PO TABS
75 MG | ORAL_TABLET | ORAL | Status: DC
Start: 2013-10-26 — End: 2014-04-01

## 2013-10-26 MED ORDER — LEVOTHYROXINE SODIUM 150 MCG PO TABS
150 MCG | ORAL_TABLET | ORAL | Status: DC
Start: 2013-10-26 — End: 2014-08-23

## 2013-10-26 MED ORDER — LISINOPRIL 10 MG PO TABS
10 MG | ORAL_TABLET | Freq: Every day | ORAL | Status: DC
Start: 2013-10-26 — End: 2014-11-06

## 2013-10-26 MED ORDER — SIMVASTATIN 40 MG PO TABS
40 MG | ORAL_TABLET | ORAL | Status: DC
Start: 2013-10-26 — End: 2014-09-26

## 2013-10-26 MED ORDER — INSULIN NPH (HUMAN) (ISOPHANE) 100 UNIT/ML SC SUSP
100 UNIT/ML | Freq: Every morning | SUBCUTANEOUS | Status: DC
Start: 2013-10-26 — End: 2014-05-31

## 2013-10-26 NOTE — Progress Notes (Signed)
[facility]  [facility]  [address]  [address]  [place] [id number]  Dept: [phone]  Dept Fax: [phone]    Kristen Gill is a 80 y.o. female who presents today for her medical conditions/complaints as noted below.  Kristen Gill is c/o of   Chief Complaint   Patient presents with   . 1 Year Follow Up     HTN DM thyroid, lipids     Diabetes and Hypertension visit    BP Readings from Last 3 Encounters:   10/26/13 138/52   08/26/12 122/82   02/01/12 130/48        HEMOGLOBIN A1C (%)   Date Value   08/21/2012 8.1*   09/26/2011 8.0*   04/16/2011 8.3*     MICROALB/CRT. RATIO (mcg/mg creat)   Date Value   07/18/2010 Unable to calculate or report.     LDL CHOLESTEROL (mg/dL)   Date Value   65/78/469601/11/2012 92     HDL (mg/dL)   Date Value   29/52/841301/11/2012 53     BUN (mg/dL)   Date Value   24/40/102709/11/2011 25*     CREATININE (mg/dL)   Date Value   25/36/644009/11/2011 1.22*     GLUCOSE (mg/dL)   Date Value   34/74/259509/11/2011 243*          Are you taking any over-the-counter medicines, vitamins, or herbal medicines? yes - see med list     Are you taking any medications for Diabetes? -  Yes   If yes, see medication list   Are you having any side effects from Diabetes medications? - No    Is Kristen Gill taking aspirin daily and it Aspirin on medication list?: -  Yes  If not and if the patient also has a history of vascular disease or is at increased risk of cardiovascular disease, the patient may be a candidate for daily aspirin. Discuss with physician.     Are you taking any medications for hypertension? -   Yes   If yes, see medication list  Are you having any side effects from hypertension medications? -  No    Are you taking cholesterol medication?  -  Yes   If yes, see medication list  Are you having any side effects from cholesterol medications? -  No    Tobacco use:  Patient  reports that she has never smoked. She has never used smokeless tobacco.  If Smoker - Cessation materials given?  No   1-800-QUIT-NOW [phone]([phone])     Have you seen any other physician or provider since your last visit no    Have you had any other diagnostic tests since your last visit? no    Have you changed or stopped any medications since your last visit including any over-the-counter medicines, vitamins, or herbal medicines? yes - see med list     Are you taking all your prescribed medications? Yes  If NO, why? -  N/A    Diabetic Patient Self-Management Goal for this visit.    What is your goal for your visit today?   []  Increase activity-exercise for 30 min daily - 3 to 4 days per week   []  Improved diet - adhere to recommended diabetic diet/low cholesterol/low sodium diet    []  Take medications as prescribed and call the office if any troubles with medications develop   []  will check blood sugars at least one time daily and blood pressure weekly   []   schedule an appointment with a diabetes educator   []  schedule yearly diabetic eye exam   []  will work on weight reduction program   [x]  yearly follow up HTN ,DM     Barriers to success: none  Plan for overcoming my barriers: N/A     Confidence: 10/10  Date goal set: 10/26/13  Date expected to reach goal: 1day                       Microalbumin performed if applicable? - No   (due every 6 to 12 months)    BP taken with correct size cuff? - Yes   Repeated if > 130/80 No     Monofilament placed on counter? - Yes   (Diabetic foot exam - yearly)  Shoes and socks removed? Yes    Reminders - reviewed with patient today - No   Tobacco -  If patient uses tobacco and intervention is done today - remember to mark counseling in the vital signs section and to give educational materials.    Foot Exam - If monofilament exam has not bee done within the past year, prepare the patient and have the testing equipment ready.    Eye Exam - Ask about diabetic retinal (eye) exam and record in health maintenance.   BP - If BP >140/90, repeat reading. Check cuff size.     Microalbumin urine  test - If not performed in the last year, notify physician. Enter order in pending status if your physician desires.     Diabetic Health Maintenance (Ensure Health Maintenance Modifier added)    There are no preventive care reminders to display for this patient.    Health Maintenance Due   Topic Date Due   . ZOSTAVAX VACCINE  09/16/1990   . PNEUMO VAC >= 65 (2 of 2 - PCV13) 01/31/2013   . FLU VACCINE YEARLY (ADULT)  08/15/2013       Medical history Review  Past Medical, Family, and Social History reviewed and does contribute to the patient presenting condition      HPI Notes          HPI:     HPI Comments: Reports that she is doing ok. Denies chest pain, no sob, n/v/d. No fever, chills. Reports that she is going to schedule an eye appointment. Reports that she has noticed a little difference in her eye sight with general vision, no loss in peripheral vision. Reports that she still has some numbness/tingling in bilateral feet, no open lesions, no scratches; no increase in neuropathy. She reports that she has help at home. She has taken precautions to try and prevent falling.   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 RATIO (mcg/mg creat)   Date Value   07/18/2010 Unable to calculate or report.     LDL CHOLESTEROL (mg/dL)   Date Value   16/10/9602 92   07/18/2010 156*   12/07/2009 147*       (goal LDL  is <100)   AST (U/L)   Date Value   09/26/2011 17     ALT (U/L)   Date Value   09/26/2011 20     BUN (mg/dL)   Date Value   16/10/9602 25*     BP Readings from Last 3 Encounters:   10/26/13 138/52   08/26/12 122/82   02/01/12 130/48          (goal 120/80)    Past  Medical History   Diagnosis Date   . IDDM (insulin dependent diabetes mellitus) (HCC)    . Hyperlipidemia    . Hypertension    . Osteoarthritis    . Depression    . Hypothyroidism       Past Surgical History   Procedure Laterality Date   . Hysterectomy     . Cholecystectomy     . Thyroidectomy     . Colonoscopy  04/14/2008     severe ischemic colitis   . Colonoscopy  06/03/2008     redundant colon, a lot of adhesions, poor prep, diverticula   . Upper gastrointestinal endoscopy  2010     dilation   . Upper gastrointestinal endoscopy  10/14/2008     esophageal lesion, biopsy shows inflammatory changes   . Upper gastrointestinal endoscopy  09/12/2011     small ulcer upper part of stomach, reactive gastropathy       Family History   Problem Relation Age of Onset   . Other Mother      died at age 50 of old age   . Heart Disease Father    . Cancer Sister      lung   . Cancer Brother    . Birth Defects Brother      unknown site       History   Substance Use Topics   . Smoking status: Never Smoker    . Smokeless tobacco: Never Used   . Alcohol Use: No      Current Outpatient Prescriptions   Medication Sig Dispense Refill   . levothyroxine (SYNTHROID) 150 MCG tablet TAKE 1 TABLET EVERY MORNING 90 tablet 2   . simvastatin (ZOCOR) 40 MG tablet TAKE 1 TABLET AT BEDTIME 90 tablet 3   . lisinopril (PRINIVIL;ZESTRIL) 10 MG tablet Take 1 tablet by mouth daily 90 tablet 3   . amitriptyline (ELAVIL) 75 MG tablet TAKE 1 TABLET TWICE A DAY 180 tablet 1   . insulin NPH (HUMULIN N) 100 UNIT/ML injection vial Inject 32 Units into the skin every morning 15 vial 2   . Insulin Syringe-Needle U-100 (B-D INS SYR ULTRAFINE 1CC/30G) 30G X 1/2" 1 ML MISC Inject 1 each into the skin 2 times daily 20 each 0   . insulin regular (HUMULIN R) 100 UNIT/ML injection Every evening with largest meal 3 vial 2   . aspirin 325 MG tablet Take 325 mg by mouth daily.     Marland Kitchen ascorbic acid (VITAMIN C) 500 MG tablet Take 500 mg by mouth daily.     . Omega-3 Fatty Acids  (OMEGA-3 FISH OIL PO) Take  by mouth.     Marland Kitchen CALCIUM & MAGNESIUM CARBONATES PO Take  by mouth daily.     . ALOE VERA JUICE PO Take 2 oz by mouth 2 times daily.     . Cyanocobalamin ([employer] B-12 PO) Take  by mouth.     . Lactobacillus CHEW Take 1 tablet by mouth 3 times daily.     Marland Kitchen omeprazole (  PRILOSEC) 20 MG capsule Take 1 capsule by mouth Daily. 30 capsule 3     No current facility-administered medications for this visit.     Allergies   Allergen Reactions   . Pravastatin        Health Maintenance   Topic Date Due   . ZOSTAVAX VACCINE  09/16/1990   . PNEUMO VAC >= 65 (2 of 2 - PCV13) 01/31/2013   . FLU VACCINE YEARLY (ADULT)  08/15/2013   . DEPRESSION SCREENING  10/27/2014   . FALLS RISK  10/27/2014   . DEXA SCAN WOMEN  01/31/2017   . TETANUS VACCINE ADULT (11 YEARS AND UP)  01/31/2022       Subjective:      Review of Systems   Constitutional: Negative for fever, chills, diaphoresis and appetite change.   HENT: Positive for dental problem (has missing teeth front bottom center). Negative for congestion, facial swelling and tinnitus.         Has partials; dental; reports that she is going to eye doctor soon.   Eyes: Positive for visual disturbance. Negative for photophobia, pain, discharge and redness.   Respiratory: Negative for apnea, cough, chest tightness and shortness of breath.    Cardiovascular: Negative for chest pain, palpitations and leg swelling.   Gastrointestinal: Negative for nausea, abdominal pain, blood in stool and abdominal distention.   Endocrine: Negative for cold intolerance, heat intolerance, polydipsia, polyphagia and polyuria.   Genitourinary: Negative for dysuria, hematuria, flank pain and difficulty urinating.   Musculoskeletal: Positive for arthralgias (generalized OA pains ). Negative for joint swelling, gait problem and neck pain.   Skin: Negative for color change, rash and wound.   Neurological: Positive for numbness (bilateral feet, chronic). Negative for tremors, seizures, facial  asymmetry, speech difficulty and light-headedness.   Psychiatric/Behavioral: Negative for suicidal ideas, behavioral problems, confusion, self-injury, dysphoric mood and agitation.       Objective:     Physical Exam   Constitutional: She is oriented to person, place, and time. She appears well-developed and well-nourished. No distress.   HENT:   Head: Normocephalic and atraumatic.   Nose: Nose normal.   Mouth/Throat: Oropharynx is clear and moist.   Eyes: Conjunctivae and EOM are normal. Right eye exhibits no discharge. Left eye exhibits no discharge. No scleral icterus.   Neck: Normal range of motion. Neck supple. No thyromegaly present.   Cardiovascular: Normal rate, regular rhythm and normal heart sounds.    No murmur heard.  Pulmonary/Chest: Effort normal and breath sounds normal. No respiratory distress. She has no wheezes. She has no rales.   Abdominal: Soft. Bowel sounds are normal. She exhibits no distension and no mass. There is no tenderness.   Musculoskeletal: Normal range of motion. She exhibits no edema or tenderness.   Lymphadenopathy:     She has no cervical adenopathy.   Neurological: She is alert and oriented to person, place, and time. No cranial nerve deficit. She exhibits normal muscle tone.   Skin: Skin is warm and dry. No rash noted. She is not diaphoretic. No erythema.   Psychiatric: She has a normal mood and affect. Her behavior is normal. Judgment normal.   Nursing note and vitals reviewed.    BP 138/52 mmHg  Pulse 70  Resp 18  Wt 153 lb (69.4 kg)    Assessment:      1. IDDM (insulin dependent diabetes mellitus) (HCC)  Hemoglobin A1C    CBC    Comprehensive Metabolic Panel  Microalbumin / Creatinine Urine Ratio   2. CKD (chronic kidney disease) stage 3, GFR 30-59 ml/min (HCC)  lisinopril (PRINIVIL;ZESTRIL) 10 MG tablet   3. Essential hypertension  lisinopril (PRINIVIL;ZESTRIL) 10 MG tablet   4. Depression screening  Negative Screen for Clinical Depression, Follow-up not Required [id number]    5. Hyperlipidemia  simvastatin (ZOCOR) 40 MG tablet   6. Acquired hypothyroidism  levothyroxine (SYNTHROID) 150 MCG tablet    T4, free    TSH without Reflex   7. Primary osteoarthritis involving multiple joints     8. At high risk for falls  HM FALL RISK   9. DM neuropathy, type II diabetes mellitus (HCC)  amitriptyline (ELAVIL) 75 MG tablet             Plan:      Return in about 6 months (around 04/27/2014), or if symptoms worsen or fail to improve, for follow up, dm, hld.    Orders Placed This Encounter   Procedures   . Hemoglobin A1C     Standing Status: Future      Number of Occurrences:       Standing Expiration Date: 10/27/2014   . CBC     Standing Status: Future      Number of Occurrences:       Standing Expiration Date: 10/27/2014   . Comprehensive Metabolic Panel     Standing Status: Future      Number of Occurrences:       Standing Expiration Date: 10/27/2014   . T4, free     Standing Status: Future      Number of Occurrences:       Standing Expiration Date: 10/27/2014   . TSH without Reflex     Standing Status: Future      Number of Occurrences:       Standing Expiration Date: 10/27/2014   . Microalbumin / Creatinine Urine Ratio     Standing Status: Future      Number of Occurrences:       Standing Expiration Date: 10/27/2014   . HM FALL RISK   . Negative Screen for Clinical Depression, Follow-up not Required [phone]     Orders Placed This Encounter   Medications   . levothyroxine (SYNTHROID) 150 MCG tablet     Sig: TAKE 1 TABLET EVERY MORNING     Dispense:  90 tablet     Refill:  2   . simvastatin (ZOCOR) 40 MG tablet     Sig: TAKE 1 TABLET AT BEDTIME     Dispense:  90 tablet     Refill:  3   . lisinopril (PRINIVIL;ZESTRIL) 10 MG tablet     Sig: Take 1 tablet by mouth daily     Dispense:  90 tablet     Refill:  3   . amitriptyline (ELAVIL) 75 MG tablet     Sig: TAKE 1 TABLET TWICE A DAY     Dispense:  180 tablet     Refill:  1   . insulin NPH (HUMULIN N) 100 UNIT/ML injection vial     Sig: Inject 32 Units  into the skin every morning     Dispense:  15 vial     Refill:  2    Reviewed medications at length; patient had wrong dose of NPH in the system. She reports that she is only taking NPH once daily usually. Checks blood sugars bid. She reports that she is still taking vitamins, but updated other outdated medications.  Labs ordered. Discussed fall prevention and monitoring feet. Follow up.        Patient given educational materials - see patient instructions.  Discussed use, benefit, and side effects of prescribed medications.  All patient questions answered. Pt voiced understanding. Reviewed health maintenance.  Instructed to continue current medications, diet and exercise.  Patient agreed with treatment plan. Follow up as directed.     Electronically signed by Marjory Sneddon, CNP on 10/26/2013 at 1:58 PM

## 2013-11-17 ENCOUNTER — Ambulatory Visit: Admit: 2013-11-17 | Discharge: 2013-11-17 | Payer: MEDICARE | Attending: Family | Primary: Family

## 2013-11-17 DIAGNOSIS — IMO0001 Reserved for inherently not codable concepts without codable children: Secondary | ICD-10-CM

## 2013-11-17 LAB — POCT GLYCOSYLATED HEMOGLOBIN (HGB A1C): Hemoglobin A1C: 6.4 %

## 2013-11-17 NOTE — Progress Notes (Signed)
[facility]NAL MEDICINE  [address]  [address]  [place] [address]  Dept: [phone]  Dept Fax: [phone]    Kristen Gill is a 80 y.o. female who presents today for her medical conditions/complaints as noted below.  Kristen Gill is c/o of   Chief Complaint   Patient presents with   ??? Other     blood sugars to low 2 incidences last week, 23, and 26     Have you seen any other physician or provider since your last visit no    Have you had any other diagnostic tests since your last visit? no    Have you changed or stopped any medications since your last visit including any over-the-counter medicines, vitamins, or herbal medicines? no     Are you taking all your prescribed medications? Yes  If NO, why? -  N/A           Patient Self-Management Goal for this visit.   What is your goal for your visit today? - low blood sugars   Barriers to success: none   Plan for overcoming my barriers: N/A      Confidence: 10/10   Date goal set: 11/17/13   Date expected to reach goal: 1week    Medical history Review  Past Medical, Family, and Social History reviewed and does contribute to the patient presenting condition    Health Maintenance Due   Topic Date Due   ??? ZOSTAVAX VACCINE  09/16/1990   ??? PNEUMO VAC >= 65 (2 of 2 - PCV13) 01/31/2013   ??? FLU VACCINE YEARLY (ADULT)  08/15/2013         HPI:     HPI Comments: Twice in the past week, most recently last night, patient had to call 911 for hypoglycemia and passing out  Took 20 units of regular insulin last night for a blood sugar of 250  EMS administered IV dextrose and patient then ate PB sandwich and milk  This AM her sugar was 210  States is not clear on how to use sliding scale insulin      Diabetes  She presents for her follow-up diabetic visit. She has type 2 diabetes mellitus. Her disease course has been fluctuating. Pertinent negatives for hypoglycemia include no confusion, dizziness, headaches or  nervousness/anxiousness. Associated symptoms include blurred vision and visual change. Pertinent negatives for diabetes include no chest pain, no fatigue and no weakness. Diabetic complications include autonomic neuropathy. Risk factors for coronary artery disease include dyslipidemia and diabetes mellitus. Current diabetic treatment includes insulin injections. She is compliant with treatment most of the time. She is following a generally healthy diet. She rarely participates in exercise. Her home blood glucose trend is fluctuating dramatically. An ACE inhibitor/angiotensin II receptor blocker is being taken. She does not see a podiatrist.Eye exam is not current (has been a y ear, will schedule).       HEMOGLOBIN A1C (%)   Date Value   11/17/2013 6.4   08/21/2012 8.1*   09/26/2011 8.0*             ( goal A1C is < 7)   MICROALB/CRT. RATIO (mcg/mg creat)   Date Value   07/18/2010 Unable to calculate or report.     LDL CHOLESTEROL (mg/dL)   Date Value   65/78/4696 92   07/18/2010 156*   12/07/2009 147*       (goal LDL is <100)   AST (U/L)  Date Value   09/26/2011 17     ALT (U/L)   Date Value   09/26/2011 20     BUN (mg/dL)   Date Value   16/10/9602 25*     BP Readings from Last 3 Encounters:   11/17/13 128/52   10/26/13 138/52   08/26/12 122/82          (goal 120/80)    Past Medical History   Diagnosis Date   ??? IDDM (insulin dependent diabetes mellitus) (HCC)    ??? Hyperlipidemia    ??? Hypertension    ??? Osteoarthritis    ??? Depression    ??? Hypothyroidism       Past Surgical History   Procedure Laterality Date   ??? Hysterectomy     ??? Cholecystectomy     ??? Thyroidectomy     ??? Colonoscopy  04/14/2008     severe ischemic colitis   ??? Colonoscopy  06/03/2008     redundant colon, a lot of adhesions, poor prep, diverticula   ??? Upper gastrointestinal endoscopy  2010     dilation   ??? Upper gastrointestinal endoscopy  10/14/2008     esophageal lesion, biopsy shows inflammatory changes   ??? Upper gastrointestinal endoscopy  09/12/2011      small ulcer upper part of stomach, reactive gastropathy       Family History   Problem Relation Age of Onset   ??? Other Mother      died at age 34 of old age   ??? Heart Disease Father    ??? Cancer Sister      lung   ??? Cancer Brother    ??? Birth Defects Brother      unknown site       History   Substance Use Topics   ??? Smoking status: Never Smoker    ??? Smokeless tobacco: Never Used   ??? Alcohol Use: No      Current Outpatient Prescriptions   Medication Sig Dispense Refill   ??? levothyroxine (SYNTHROID) 150 MCG tablet TAKE 1 TABLET EVERY MORNING 90 tablet 2   ??? simvastatin (ZOCOR) 40 MG tablet TAKE 1 TABLET AT BEDTIME 90 tablet 3   ??? lisinopril (PRINIVIL;ZESTRIL) 10 MG tablet Take 1 tablet by mouth daily 90 tablet 3   ??? amitriptyline (ELAVIL) 75 MG tablet TAKE 1 TABLET TWICE A DAY 180 tablet 1   ??? insulin NPH (HUMULIN N) 100 UNIT/ML injection vial Inject 32 Units into the skin every morning 15 vial 2   ??? Insulin Syringe-Needle U-100 (B-D INS SYR ULTRAFINE 1CC/30G) 30G X 1/2" 1 ML MISC Inject 1 each into the skin 2 times daily 20 each 0   ??? insulin regular (HUMULIN R) 100 UNIT/ML injection Every evening with largest meal 3 vial 2   ??? aspirin 325 MG tablet Take 325 mg by mouth daily.     ??? ascorbic acid (VITAMIN C) 500 MG tablet Take 500 mg by mouth daily.     ??? Omega-3 Fatty Acids (OMEGA-3 FISH OIL PO) Take  by mouth.     ??? CALCIUM & MAGNESIUM CARBONATES PO Take  by mouth daily.     ??? ALOE VERA JUICE PO Take 2 oz by mouth 2 times daily.     ??? Lactobacillus CHEW Take 1 tablet by mouth 3 times daily.     ??? Cyanocobalamin ([employer] B-12 PO) Take  by mouth.     ??? omeprazole (PRILOSEC) 20 MG capsule Take 1 capsule by mouth  Daily. 30 capsule 3     No current facility-administered medications for this visit.     Allergies   Allergen Reactions   ??? Pravastatin        Health Maintenance   Topic Date Due   ??? ZOSTAVAX VACCINE  09/16/1990   ??? PNEUMO VAC >= 65 (2 of 2 - PCV13) 01/31/2013   ??? FLU VACCINE YEARLY (ADULT)  08/15/2013   ???  DEPRESSION SCREENING  10/27/2014   ??? FALLS RISK  10/27/2014   ??? DEXA SCAN WOMEN  01/31/2017   ??? TETANUS VACCINE ADULT (11 YEARS AND UP)  01/31/2022       Subjective:      Review of Systems   Constitutional: Negative for fever, chills, activity change, appetite change, fatigue and unexpected weight change.   HENT: Negative for congestion, sinus pressure and sore throat.    Eyes: Positive for blurred vision. Negative for visual disturbance.   Respiratory: Negative for cough, shortness of breath and wheezing.    Cardiovascular: Negative for chest pain, palpitations and leg swelling.   Gastrointestinal: Negative for nausea, vomiting, abdominal pain, diarrhea, constipation and blood in stool.   Genitourinary: Negative for urgency, frequency, hematuria and difficulty urinating.   Musculoskeletal: Negative for myalgias and arthralgias.   Skin: Negative for rash.   Allergic/Immunologic: Negative for environmental allergies.   Neurological: Negative for dizziness, weakness, light-headedness and headaches.   Psychiatric/Behavioral: Negative for confusion. The patient is not nervous/anxious.        Objective:     Physical Exam   Constitutional: She is oriented to person, place, and time. She appears well-developed and well-nourished.   HENT:   Head: Normocephalic.   Eyes: Conjunctivae and EOM are normal. Pupils are equal, round, and reactive to light. Right eye exhibits no discharge.   Neck: Normal range of motion.   Cardiovascular: Normal rate, regular rhythm, normal heart sounds and intact distal pulses.    No murmur heard.  Pulmonary/Chest: Effort normal and breath sounds normal. She has no wheezes.   Abdominal: Soft. Bowel sounds are normal. She exhibits no distension.   Musculoskeletal: Normal range of motion.   Neurological: She is alert and oriented to person, place, and time.   Skin: Skin is warm and dry.   Psychiatric: She has a normal mood and affect. Her behavior is normal. Judgment and thought content normal.      BP 128/52 mmHg   Pulse 82   Resp 18   Wt 157 lb (71.215 kg)    Assessment:      1. IDDM (insulin dependent diabetes mellitus) (HCC)  POCT glycosylated hemoglobin (Hb A1C)   2. Hyperlipidemia, unspecified hyperlipidemia  Lipid Panel             Plan:      Return in about 2 weeks (around 12/01/2013) for diabetes check.    Orders Placed This Encounter   Procedures   ??? Lipid Panel     Standing Status: Future      Number of Occurrences:       Standing Expiration Date: 11/17/2014     Order Specific Question:  Is Patient Fasting?/# of Hours     Answer:  12   ??? POCT glycosylated hemoglobin (Hb A1C)     Given detailed information within the patient instructions on sliding scale as follows:  Sliding Scale for Regular insulin at meal times only, bedtime with snack only:  Sugar 250-300= 2 units  Sugar 301-350= 4 units  Sugar 351-400=  6 units  DO NOT USE WITHOUT CHECKING SUGAR FIRST AND EAT WHEN YOU USE    Notify office if continues to have unstable blood sugars  Follow up in 2 weeks to re-assess, patient instructed to bring glucose log with her to appointment    Lipid panel added to other previous lab orders as last was Jan of 2014     Patient given educational materials - see patient instructions.  Discussed use, benefit, and side effects of prescribed medications.  All patient questions answered. Pt voiced understanding. Reviewed health maintenance.  Instructed to continue current medications, diet and exercise.  Patient agreed with treatment plan. Follow up as directed.     Electronically signed by Michelle Piperiane J Klyn Kroening, CNP on 11/17/2013 at 5:48 PM

## 2013-11-17 NOTE — Patient Instructions (Addendum)
Diabetes Blood Sugar Emergencies: Your Action Plan  How can you prevent a blood sugar emergency?  An important part of living with diabetes is keeping your blood sugar in your target range. You'll need to know what to do if it's too high or too low. Managing your blood sugar levels helps you avoid emergencies. This care sheet will teach you about the signs of high and low blood sugar. It will help you make an action plan with your doctor for when these signs occur.  Low blood sugar is more likely to happen if you take certain medicines for diabetes. It can also happen if you skip a meal, drink alcohol, or exercise more than usual.  You may get high blood sugar if you eat differently than you normally do. One example is eating more carbohydrate than usual. Having a cold, the flu, or other sudden illness can also cause high blood sugar levels. Levels can also rise if you miss a dose of medicine.  Any change in how you take your medicine may affect your blood sugar level. So it's important to work with your doctor before you make any changes.  Check your blood sugar  Work with your doctor to fill in the blank spaces below that apply to you.   Track your levels, know your target range, and write down ways you can get your blood sugar back in your target range. A log book can help you track your levels. Take the book to all of your medical appointments.  ?? Check your blood sugar _____ times a day, at these times:________________________________________________. (For example: Before meals, at bedtime, before exercise, during exercise, other.)  ?? Your blood sugar target range before a meal is ___________________. Your blood sugar target range after a meal is _______________________.  ?? Do this--___________________________________________________--to get your blood sugar back within your safe range if your blood sugar results are _________________________________________. (For example: Between 70 and 250 mg/dL.)  Call  your doctor when your blood sugar results are ___________________________________. (For example: Less than 70 or above 250 mg/dL.)  What are the symptoms of low and high blood sugar?  Common symptoms of low blood sugar are sweating and feeling shaky, weak, hungry, or confused. Symptoms can start quickly.  Common symptoms of high blood sugar are feeling very thirsty or very hungry. You may also pass urine more often than usual. You may have blurry vision and may lose weight without trying.  But some people may have high or low blood sugar without having any symptoms. That's a good reason to check your blood sugar on a regular schedule.  What should you do if you have symptoms?  Work with your doctor to fill in the blank spaces below that apply to you.   Low blood sugar  If you have symptoms of low blood sugar, check your blood sugar. If it's below _____ (for example, below 70), eat or drink a quick-sugar food that has about 15 grams of carbohydrate. Your goal is to get your level back to your safe range. Check your blood sugar again 15 minutes later. If it's still not in your target range, take another 15 grams of carbohydrate and check your blood sugar again in 15 minutes. Repeat this until you reach your target. Then go back to your regular testing schedule.  When you have low blood sugar, it's best to stop or reduce any physical activity until your blood sugar is back in your target range and is stable. If you   must stay active, eat or drink 30 grams of carbohydrate. Then check your blood sugar again in 15 minutes. If it's not in your target range, take another 30 grams of carbohydrates. Check your blood sugar again in 15 minutes. Keep doing this until you reach your target. You can then go back to your regular testing schedule.  If your symptoms or blood sugar levels are getting worse or have not improved after 15 minutes, seek medical care right away.   Here are some examples of quick-sugar foods with 15 grams of  carbohydrate:  ?? 3 or 4 glucose tablets  ?? 1 tube of glucose gel  ?? Hard candy (such as 3 Jolly Ranchers or 5 to [employer] Life Savers)  ?? ?? cup to ?? cup (4 to 6 ounces) of fruit juice or regular (not diet) soda  High blood sugar  If you have symptoms of high blood sugar, check your blood sugar. Your goal is to get your level back to your target range. If it's above ______ (for example, above 250), follow these steps:  ?? If you missed a dose of your diabetes medicine, take it now. Take only the amount of medicine that you have been prescribed. Do not take more or less medicine.  ?? Give yourself insulin if your doctor has prescribed it for high blood sugar.  ?? Test for ketones, if the doctor told you to do so. If the results of the ketone test show a moderate-to-large amount of ketones, call the doctor for advice.  ?? Wait 30 minutes after you take the extra insulin or the missed medicine. Check your blood sugar again.  If your symptoms or blood sugar levels are getting worse or have not improved after taking these steps, seek medical care right away.   Follow-up care is a key part of your treatment and safety. Be sure to make and go to all appointments, and call your doctor if you are having problems. It's also a good idea to know your test results and keep a list of the medicines you take.   Where can you learn more?   Go to [email or web address] and sign in to your MyChart account. Enter [phone] in the Search Health Information box to learn more about ???Diabetes Blood Sugar Emergencies: Your Action Plan.???    If you do not have an account, please click on the ???Sign Up Now??? link.     ?? 2006-2015 [employer]. Care instructions adapted under license by [facility]. This care instruction is for use with your licensed healthcare professional. If you have questions about a medical condition or this instruction, always ask your healthcare professional. [employer] disclaims any warranty or  liability for your use of this information.  Content Version: 10.6.465758; Current as of: Jun 05, 2013    Sliding Scale for Regular insulin at meal times only, bedtime with snack only:  Sugar 250-300= 2 units  Sugar 301-350= 4 units  Sugar 351-400= 6 units  DO NOT USE WITHOUT CHECKING SUGAR FIRST AND EAT WHEN YOU USE

## 2013-11-26 ENCOUNTER — Inpatient Hospital Stay: Attending: Family | Primary: Family

## 2013-11-26 DIAGNOSIS — E785 Hyperlipidemia, unspecified: Secondary | ICD-10-CM

## 2013-11-26 LAB — COMPREHENSIVE METABOLIC PANEL
ALT: 15 U/L (ref 5–33)
AST: 20 U/L (ref <32)
Albumin: 4.1 g/dL (ref 3.5–5.2)
Alkaline Phosphatase: 64 U/L (ref 35–104)
Anion Gap: 14 mmol/L (ref 9–17)
BUN: 24 mg/dL — ABNORMAL HIGH (ref 8–23)
CO2: 27 mmol/L (ref 20–31)
Calcium: 9.3 mg/dL (ref 8.6–10.4)
Chloride: 100 mmol/L (ref 98–107)
Creatinine: 1.12 mg/dL — ABNORMAL HIGH (ref 0.50–0.90)
GFR African American: 56 mL/min — ABNORMAL LOW (ref >60)
GFR Non-African American: 46 mL/min — ABNORMAL LOW (ref >60)
Glucose: 108 mg/dL — ABNORMAL HIGH (ref 70–99)
Potassium: 4.7 mmol/L (ref 3.7–5.3)
Sodium: 141 mmol/L (ref 135–144)
Total Bilirubin: 0.54 mg/dL (ref 0.3–1.2)
Total Protein: 7.1 g/dL (ref 6.4–8.3)

## 2013-11-26 LAB — CBC
Hematocrit: 38.7 % (ref 36–46)
Hemoglobin: 12.4 g/dL (ref 12.0–16.0)
MCH: 29.4 pg (ref 26–34)
MCHC: 31.9 g/dL (ref 31–37)
MCV: 92.2 fL (ref 80–100)
MPV: 9.6 fL (ref 6.0–12.0)
Platelets: 216 10*3/uL (ref 150–450)
RBC: 4.2 m/uL (ref 4.0–5.2)
RDW: 13.5 % (ref 11.5–14.9)
WBC: 6.7 10*3/uL (ref 3.5–11.0)

## 2013-11-26 LAB — MICROALBUMIN / CREATININE URINE RATIO
Creatinine, Ur: 92.3 mg/dL (ref 39.0–259.0)
Microalb, Ur: <12 mg/L (ref <21)
Microalb/Crt. Ratio: 13 mcg/mg creat

## 2013-11-26 LAB — TSH: TSH: 3.71 mIU/L (ref 0.30–5.00)

## 2013-11-26 LAB — HEMOGLOBIN A1C
Estimated Avg Glucose: 131 mg/dL
Hemoglobin A1C: 6.2 % — ABNORMAL HIGH (ref 4.0–6.0)

## 2013-11-26 LAB — LIPID PANEL
Chol/HDL Ratio: 2.2 (ref <5)
Cholesterol: 157 mg/dL (ref <200)
HDL: 72 mg/dL (ref >40)
LDL Cholesterol: 65 mg/dL (ref 0–130)
Triglycerides: 99 mg/dL (ref <150)

## 2013-11-26 LAB — T4, FREE: Thyroxine, Free: 1.42 ng/dL (ref 0.93–1.70)

## 2013-11-26 NOTE — Telephone Encounter (Signed)
-----   Message from Michelle Piperiane J Kielmeyer, CNP sent at 11/26/2013  1:20 PM EST -----  Please let patient know that her lab work is within normal limits with no further decline in her kidney function   Thanks

## 2013-11-26 NOTE — Telephone Encounter (Signed)
LMOM for patient to return call to office regarding lab results. Please advise.

## 2013-12-01 ENCOUNTER — Ambulatory Visit: Admit: 2013-12-01 | Discharge: 2013-12-01 | Payer: MEDICARE | Attending: Family | Primary: Family

## 2013-12-01 DIAGNOSIS — IMO0001 Reserved for inherently not codable concepts without codable children: Secondary | ICD-10-CM

## 2013-12-01 MED ORDER — GLUCAGON (RDNA) 1 MG IJ KIT
1 MG | PACK | INTRAMUSCULAR | Status: DC
Start: 2013-12-01 — End: 2015-10-11

## 2013-12-01 NOTE — Telephone Encounter (Signed)
msg left for pt to return our call x 2.   msg left on pt v/m of normal results

## 2013-12-01 NOTE — Progress Notes (Signed)
[facility]  [address]  [address] [place] [address]  Dept: [phone]  Dept Fax: [phone]    Kristen Gill is a 80 y.o. female who presents today for her medical conditions/complaints as noted below.  Kristen Gill is c/o of   Chief Complaint   Patient presents with   ??? Other     recheck diabetes   ??? Discuss Labs     from 11/26/13     Diabetic visit information    BP Readings from Last 3 Encounters:   12/01/13 148/60   11/17/13 128/52   10/26/13 138/52       HEMOGLOBIN A1C (%)   Date Value   11/26/2013 6.2*   11/17/2013 6.4   08/21/2012 8.1*     MICROALB/CRT. RATIO (mcg/mg creat)   Date Value   11/26/2013 13     LDL CHOLESTEROL (mg/dL)   Date Value   11/26/2013 65              Tobacco use:  Patient  reports that she has never smoked. She has never used smokeless tobacco.  If Smoker - Cessation materials given?- No   1-800-QUIT-NOW [phone])     Is Daily aspirin on medication list?: - No  If not and if the patient also has a history of vascular disease or is at increased risk of cardiovascular disease, the patient may be a candidate for daily aspirin. Discuss with physician.     Diabetic retinal exam done? - No   If yes, document date performed in Health Maintenance.     Is patient taking any medications for diabetes? -   Yes   If yes, see medication list  Are you having any side effects from diabetes medications? -  No    Are you taking cholesterol medication?  -   No   If yes, see medication list  Are you having any side effects from cholesterol medications? - No    Have you seen any other physician or provider since your last visit no    Have you had any other diagnostic tests since your last visit? no    Have you changed or stopped any medications since your last visit including any over-the-counter medicines, vitamins, or herbal medicines? no     Are you taking all your prescribed medications? Yes  If NO, why? -  N/A    Is patient  taking any over the counter medications    Yes   If yes, see medication list      Diabetic Patient Self-Management Goal for this visit.    What is your goal for your visit today?   [] Increase activity-exercise for 30 min daily - 3 to 4 days per week   [] Improved diet - adhere to recommended diabetic diet/low cholesterol    [] Take medications as prescribed and call the office if any troubles with medications develop   [] will check blood sugars at least one time daily   [] schedule an appointment with a diabetes educator   [] schedule yearly diabetic eye exam   [] will work on weight reduction program   [] recheck DM     Barriers to success: none  Plan for overcoming my barriers: N/A     Confidence: 10/10  Date goal set: 12/01/13  Date expected to reach goal: 1day  Microalbumin performed if applicable? - No   (due every 6 to 12 months)    BP taken with correct size cuff? - Yes   Repeated if > 130/80 No     Monofilament placed on counter? - No   (Diabetic foot exam - yearly)  Shoes and socks removed? - No    Reminders - reviewed with patient today - No  ?? Tobacco -  If patient uses tobacco and intervention is done today - remember to mark counseling in the vital signs section and to give educational materials.   ?? Foot Exam - If monofilament exam has not bee done within the past year, prepare the patient and have the testing equipment ready.   ?? Eye Exam - Ask about diabetic retinal (eye) exam and record in health maintenance.  ?? BP - If BP >140/90, repeat reading. Check cuff size.    ?? Microalbumin urine test - If not performed in the last year, notify physician. Enter order in pending status if your physician desires.     Diabetic Health Maintenance (Ensure Health Maintenance Modifier added)  There are no preventive care reminders to display for this patient.      Health Maintenance Due   Topic Date Due   ??? ZOSTAVAX VACCINE  09/16/1990   ??? PNEUMO VAC >= 65 (2 of 2 - PCV13) 01/31/2013   ??? FLU  VACCINE YEARLY (ADULT)  08/15/2013       Medical history Review  Past Medical, Family, and Social History reviewed and does contribute to the patient presenting condition      HPI Notes                  HPI:     HPI Comments: Here today for diabetes check  Has been checking glucose levels, has been stable, glucose levels ranging mainly 100-200  Has not had any further episodes of severe hypoglycemia since last visit  Only used meal time coverage 3-4 times  Daughter asking about glucagon rx in case she has another severe episode of low blood sugar    Diabetes  She presents for her follow-up diabetic visit. She has type 2 diabetes mellitus. Her disease course has been stable. There are no hypoglycemic associated symptoms. Pertinent negatives for hypoglycemia include no confusion, dizziness, headaches or nervousness/anxiousness. Associated symptoms include blurred vision. Pertinent negatives for diabetes include no chest pain, no fatigue, no polydipsia, no polyphagia, no polyuria and no weakness. Pertinent negatives for hypoglycemia complications include no blackouts. Symptoms are improving. Risk factors for coronary artery disease include dyslipidemia, diabetes mellitus and sedentary lifestyle. Current diabetic treatment includes insulin injections. She is compliant with treatment all of the time. Her weight is stable. She is following a generally healthy diet. An ACE inhibitor/angiotensin II receptor blocker is being taken.       HEMOGLOBIN A1C (%)   Date Value   11/26/2013 6.2*   11/17/2013 6.4   08/21/2012 8.1*             ( goal A1C is < 7)   MICROALB/CRT. RATIO (mcg/mg creat)   Date Value   11/26/2013 13     LDL CHOLESTEROL (mg/dL)   Date Value   11/26/2013 65   01/26/2012 92   07/18/2010 156*       (goal LDL is <100)   AST (U/L)   Date Value   11/26/2013 20     ALT (U/L)   Date Value   11/26/2013 15  BUN (mg/dL)   Date Value   11/26/2013 24*     BP Readings from Last 3 Encounters:   12/01/13 148/60   11/17/13  128/52   10/26/13 138/52          (goal 120/80)    Past Medical History   Diagnosis Date   ??? IDDM (insulin dependent diabetes mellitus) ([facility])    ??? Hyperlipidemia    ??? Hypertension    ??? Osteoarthritis    ??? Depression    ??? Hypothyroidism       Past Surgical History   Procedure Laterality Date   ??? Hysterectomy     ??? Cholecystectomy     ??? Thyroidectomy     ??? Colonoscopy  04/14/2008     severe ischemic colitis   ??? Colonoscopy  06/03/2008     redundant colon, a lot of adhesions, poor prep, diverticula   ??? Upper gastrointestinal endoscopy  2010     dilation   ??? Upper gastrointestinal endoscopy  10/14/2008     esophageal lesion, biopsy shows inflammatory changes   ??? Upper gastrointestinal endoscopy  09/12/2011     small ulcer upper part of stomach, reactive gastropathy       Family History   Problem Relation Age of Onset   ??? Other Mother      died at age 27 of old age   ??? Heart Disease Father    ??? Cancer Sister      lung   ??? Cancer Brother    ??? Birth Defects Brother      unknown site       History   Substance Use Topics   ??? Smoking status: Never Smoker    ??? Smokeless tobacco: Never Used   ??? Alcohol Use: No      Current Outpatient Prescriptions   Medication Sig Dispense Refill   ??? glucagon 1 MG injection Inject 1 mg into the skin See Admin Instructions Use for symptomatic hypoglycemia only when patient is unable to take oral food or glucose tabs 1 kit 3   ??? levothyroxine (SYNTHROID) 150 MCG tablet TAKE 1 TABLET EVERY MORNING 90 tablet 2   ??? simvastatin (ZOCOR) 40 MG tablet TAKE 1 TABLET AT BEDTIME 90 tablet 3   ??? lisinopril (PRINIVIL;ZESTRIL) 10 MG tablet Take 1 tablet by mouth daily 90 tablet 3   ??? amitriptyline (ELAVIL) 75 MG tablet TAKE 1 TABLET TWICE A DAY 180 tablet 1   ??? insulin NPH (HUMULIN N) 100 UNIT/ML injection vial Inject 32 Units into the skin every morning 15 vial 2   ??? Insulin Syringe-Needle U-100 (B-D INS SYR ULTRAFINE 1CC/30G) 30G X 1/2" 1 ML MISC Inject 1 each into the skin 2 times daily 20 each 0   ??? insulin  regular (HUMULIN R) 100 UNIT/ML injection Every evening with largest meal 3 vial 2   ??? aspirin 325 MG tablet Take 325 mg by mouth daily.     ??? ascorbic acid (VITAMIN C) 500 MG tablet Take 500 mg by mouth daily.     ??? Omega-3 Fatty Acids (OMEGA-3 FISH OIL PO) Take  by mouth.     ??? CALCIUM & MAGNESIUM CARBONATES PO Take  by mouth daily.     ??? ALOE VERA JUICE PO Take 2 oz by mouth 2 times daily.     ??? Lactobacillus CHEW Take 1 tablet by mouth 3 times daily.     ??? Cyanocobalamin ([employer] B-12 PO) Take  by mouth.     ???  omeprazole (PRILOSEC) 20 MG capsule Take 1 capsule by mouth Daily. 30 capsule 3     No current facility-administered medications for this visit.     Allergies   Allergen Reactions   ??? Pravastatin        Health Maintenance   Topic Date Due   ??? ZOSTAVAX VACCINE  09/16/1990   ??? PNEUMO VAC >= 65 (2 of 2 - PCV13) 01/31/2013   ??? FLU VACCINE YEARLY (ADULT)  08/15/2013   ??? DEPRESSION SCREENING  10/27/2014   ??? FALLS RISK  10/27/2014   ??? DEXA SCAN WOMEN  01/31/2017   ??? TETANUS VACCINE ADULT (11 YEARS AND UP)  01/31/2022       Subjective:      Review of Systems   Constitutional: Negative for fever, chills, activity change, appetite change, fatigue and unexpected weight change.   HENT: Negative for congestion, ear pain, hearing loss, sinus pressure, sore throat and trouble swallowing.    Eyes: Positive for blurred vision. Negative for visual disturbance.   Respiratory: Negative for cough, shortness of breath and wheezing.    Cardiovascular: Negative for chest pain, palpitations and leg swelling.   Gastrointestinal: Negative for nausea, vomiting, abdominal pain, diarrhea, constipation and blood in stool.   Endocrine: Negative for cold intolerance, heat intolerance, polydipsia, polyphagia and polyuria.   Genitourinary: Negative for urgency, frequency, hematuria and difficulty urinating.   Musculoskeletal: Negative for myalgias and arthralgias.   Skin: Negative for rash.   Allergic/Immunologic: Negative for environmental  allergies.   Neurological: Negative for dizziness, weakness, light-headedness and headaches.   Psychiatric/Behavioral: Negative for confusion. The patient is not nervous/anxious.        Objective:     Physical Exam   Constitutional: She is oriented to person, place, and time. She appears well-developed and well-nourished.   HENT:   Head: Normocephalic.   Eyes: Conjunctivae and EOM are normal. Pupils are equal, round, and reactive to light. Right eye exhibits no discharge.   Neck: Normal range of motion.   Cardiovascular: Normal rate, regular rhythm, normal heart sounds and intact distal pulses.    No murmur heard.  Pulmonary/Chest: Effort normal and breath sounds normal. She has no wheezes.   Abdominal: Soft. Bowel sounds are normal. She exhibits no distension.   Musculoskeletal: Normal range of motion.   Neurological: She is alert and oriented to person, place, and time.   Skin: Skin is warm and dry.   Psychiatric: She has a normal mood and affect. Her behavior is normal. Judgment and thought content normal.     BP 148/60 mmHg   Pulse 78   Resp 16   Wt 159 lb 9.6 oz (72.394 kg)    Assessment:      1. IDDM (insulin dependent diabetes mellitus) ([facility])  glucagon 1 MG injection   2. Depression     3. Essential hypertension               Plan:      Return in about 3 months (around 03/03/2014) for diabetes check.      Orders Placed This Encounter   Medications   ??? glucagon 1 MG injection     Sig: Inject 1 mg into the skin See Admin Instructions Use for symptomatic hypoglycemia only when patient is unable to take oral food or glucose tabs     Dispense:  1 kit     Refill:  3      DM stable, no further hypoglycemic episodes  Continue to  check glucose levels at meal time and record  Glucagon inj ordered, instructed to use only if patient is unable to take anything orally  Lab work results reviewed and shared with patient and daughter, A1C stable, labs WNL   Patient given educational materials - see patient instructions.   Discussed use, benefit, and side effects of prescribed medications.  All patient questions answered. Pt voiced understanding. Reviewed health maintenance.  Instructed to continue current medications, diet and exercise.  Patient agreed with treatment plan. Follow up as directed.     Electronically signed by Jefferey Pica, CNP on 12/01/2013 at 4:22 PM

## 2013-12-01 NOTE — Telephone Encounter (Signed)
-----   Message from Diane J Kielmeyer, CNP sent at 11/26/2013  1:20 PM EST -----  Please let patient know that her lab work is within normal limits with no further decline in her kidney function   Thanks

## 2013-12-01 NOTE — Patient Instructions (Signed)
Diabetes Blood Sugar Emergencies: Your Action Plan  How can you prevent a blood sugar emergency?  An important part of living with diabetes is keeping your blood sugar in your target range. You'll need to know what to do if it's too high or too low. Managing your blood sugar levels helps you avoid emergencies. This care sheet will teach you about the signs of high and low blood sugar. It will help you make an action plan with your doctor for when these signs occur.  Low blood sugar is more likely to happen if you take certain medicines for diabetes. It can also happen if you skip a meal, drink alcohol, or exercise more than usual.  You may get high blood sugar if you eat differently than you normally do. One example is eating more carbohydrate than usual. Having a cold, the flu, or other sudden illness can also cause high blood sugar levels. Levels can also rise if you miss a dose of medicine.  Any change in how you take your medicine may affect your blood sugar level. So it's important to work with your doctor before you make any changes.  Check your blood sugar  Work with your doctor to fill in the blank spaces below that apply to you.   Track your levels, know your target range, and write down ways you can get your blood sugar back in your target range. A log book can help you track your levels. Take the book to all of your medical appointments.  ?? Check your blood sugar _____ times a day, at these times:________________________________________________. (For example: Before meals, at bedtime, before exercise, during exercise, other.)  ?? Your blood sugar target range before a meal is ___________________. Your blood sugar target range after a meal is _______________________.  ?? Do this--___________________________________________________--to get your blood sugar back within your safe range if your blood sugar results are _________________________________________. (For example: Between 70 and 250 mg/dL.)  Call  your doctor when your blood sugar results are ___________________________________. (For example: Less than 70 or above 250 mg/dL.)  What are the symptoms of low and high blood sugar?  Common symptoms of low blood sugar are sweating and feeling shaky, weak, hungry, or confused. Symptoms can start quickly.  Common symptoms of high blood sugar are feeling very thirsty or very hungry. You may also pass urine more often than usual. You may have blurry vision and may lose weight without trying.  But some people may have high or low blood sugar without having any symptoms. That's a good reason to check your blood sugar on a regular schedule.  What should you do if you have symptoms?  Work with your doctor to fill in the blank spaces below that apply to you.   Low blood sugar  If you have symptoms of low blood sugar, check your blood sugar. If it's below _____ (for example, below 70), eat or drink a quick-sugar food that has about 15 grams of carbohydrate. Your goal is to get your level back to your safe range. Check your blood sugar again 15 minutes later. If it's still not in your target range, take another 15 grams of carbohydrate and check your blood sugar again in 15 minutes. Repeat this until you reach your target. Then go back to your regular testing schedule.  When you have low blood sugar, it's best to stop or reduce any physical activity until your blood sugar is back in your target range and is stable. If you   must stay active, eat or drink 30 grams of carbohydrate. Then check your blood sugar again in 15 minutes. If it's not in your target range, take another 30 grams of carbohydrates. Check your blood sugar again in 15 minutes. Keep doing this until you reach your target. You can then go back to your regular testing schedule.  If your symptoms or blood sugar levels are getting worse or have not improved after 15 minutes, seek medical care right away.   Here are some examples of quick-sugar foods with 15 grams of  carbohydrate:  ?? 3 or 4 glucose tablets  ?? 1 tube of glucose gel  ?? Hard candy (such as 3 Jolly Ranchers or 5 to 7 Life Savers)  ?? ?? cup to ?? cup (4 to 6 ounces) of fruit juice or regular (not diet) soda  High blood sugar  If you have symptoms of high blood sugar, check your blood sugar. Your goal is to get your level back to your target range. If it's above ______ (for example, above 250), follow these steps:  ?? If you missed a dose of your diabetes medicine, take it now. Take only the amount of medicine that you have been prescribed. Do not take more or less medicine.  ?? Give yourself insulin if your doctor has prescribed it for high blood sugar.  ?? Test for ketones, if the doctor told you to do so. If the results of the ketone test show a moderate-to-large amount of ketones, call the doctor for advice.  ?? Wait 30 minutes after you take the extra insulin or the missed medicine. Check your blood sugar again.  If your symptoms or blood sugar levels are getting worse or have not improved after taking these steps, seek medical care right away.   Follow-up care is a key part of your treatment and safety. Be sure to make and go to all appointments, and call your doctor if you are having problems. It's also a good idea to know your test results and keep a list of the medicines you take.   Where can you learn more?   Go to [email or web address] and sign in to your MyChart account. Enter G983 in the Search Health Information box to learn more about ???Diabetes Blood Sugar Emergencies: Your Action Plan.???    If you do not have an account, please click on the ???Sign Up Now??? link.     ?? 2006-2015 [employer]. Care instructions adapted under license by [facility]. This care instruction is for use with your licensed healthcare professional. If you have questions about a medical condition or this instruction, always ask your healthcare professional. [employer], Inc[employer] disclaims any warranty or  liability for your use of this information.  Content Version: 10.6.465758; Current as of: Jun 05, 2013

## 2013-12-14 MED ORDER — INSULIN SYRINGE-NEEDLE U-100 30G X 1/2" 1 ML MISC
Freq: Two times a day (BID) | Status: AC
Start: 2013-12-14 — End: ?

## 2013-12-14 NOTE — Telephone Encounter (Signed)
Pharmacy requests refill of B-D ins syr ultrafine will e-scribe.

## 2014-03-26 MED ORDER — HUMULIN N 100 UNIT/ML SC SUSP
100 UNIT/ML | SUBCUTANEOUS | Status: DC
Start: 2014-03-26 — End: 2015-06-20

## 2014-04-01 MED ORDER — AMITRIPTYLINE HCL 75 MG PO TABS
75 MG | ORAL_TABLET | Freq: Two times a day (BID) | ORAL | Status: DC
Start: 2014-04-01 — End: 2014-12-29

## 2014-04-01 NOTE — Telephone Encounter (Signed)
Pharmacy requests refill of elavil, last ov 12/01/13, last refill 10/26/13 please approve or refuse.

## 2014-04-05 ENCOUNTER — Ambulatory Visit: Admit: 2014-04-05 | Discharge: 2014-04-05 | Payer: MEDICARE | Attending: Family | Primary: Family

## 2014-04-05 ENCOUNTER — Encounter: Attending: Family | Primary: Family

## 2014-04-05 DIAGNOSIS — IMO0001 Reserved for inherently not codable concepts without codable children: Secondary | ICD-10-CM

## 2014-04-05 MED ORDER — MELOXICAM 7.5 MG PO TABS
7.5 MG | ORAL_TABLET | Freq: Every day | ORAL | Status: DC
Start: 2014-04-05 — End: 2014-06-01

## 2014-04-05 MED ORDER — IBUPROFEN 800 MG PO TABS
800 MG | ORAL_TABLET | Freq: Four times a day (QID) | ORAL | Status: DC | PRN
Start: 2014-04-05 — End: 2014-06-03

## 2014-04-05 NOTE — Patient Instructions (Signed)
Arthritis: Care Instructions  Your Care Instructions  Arthritis, also called osteoarthritis, is a breakdown of the cartilage that cushions your joints. When the cartilage wears down, your bones rub against each other. This causes pain and stiffness. Many people have some arthritis as they age. Arthritis most often affects the joints of the spine, hands, hips, knees, or feet.  You can take simple measures to protect your joints, ease your pain, and help you stay active.  Follow-up care is a key part of your treatment and safety. Be sure to make and go to all appointments, and call your doctor if you are having problems. It's also a good idea to know your test results and keep a list of the medicines you take.  How can you care for yourself at home?  ?? Stay at a healthy weight. Being overweight puts extra strain on your joints.  ?? Talk to your doctor or physical therapist about exercises that will help ease joint pain.  ?? Stretch. You may enjoy gentle forms of yoga to help keep your joints and muscles flexible.  ?? Walk instead of jog. Other types of exercise that are less stressful on the joints include riding a bicycle, swimming, or water exercise.  ?? Lift weights. Strong muscles help reduce stress on your joints. Stronger thigh muscles, for example, take some of the stress off of the knees and hips. Learn the right way to lift weights so you do not make joint pain worse.  ?? Take your medicines exactly as prescribed. Call your doctor if you think you are having a problem with your medicine.  ?? Take pain medicines exactly as directed.  ?? If the doctor gave you a prescription medicine for pain, take it as prescribed.  ?? If you are not taking a prescription pain medicine, ask your doctor if you can take an over-the-counter medicine.  ?? Use a cane, crutch, walker, or another device if you need help to get around. These can help rest your joints. You also can use other things to make life easier, such as a higher toilet  seat and padded handles on kitchen utensils.  ?? Do not sit in low chairs, which can make it hard to get up.  ?? Put heat or cold on your sore joints as needed. Use whichever helps you most. You also can take turns with hot and cold packs.  ?? Apply heat 2 or 3 times a day for 20 to 30 minutes--using a heating pad, hot shower, or hot pack--to relieve pain and stiffness.  ?? Put ice or a cold pack on your sore joint for 10 to 20 minutes at a time. Put a thin cloth between the ice and your skin.  When should you call for help?  Call your doctor now or seek immediate medical care if:  ?? You have sudden swelling, warmth, or pain in any joint.  ?? You have joint pain and a fever or rash.  ?? You have such bad pain that you cannot use a joint.  Watch closely for changes in your health, and be sure to contact your doctor if:  ?? You have mild joint symptoms that continue even with more than 6 weeks of care at home.  ?? You have stomach pain or other problems with your medicine.   Where can you learn more?   Go to [email or web address] and sign in to your MyChart account. Enter Z807 in the Search Health Information box to learn more about ???Arthritis:   Care Instructions.???    If you do not have an account, please click on the ???Sign Up Now??? link.     ?? 2006-2015 [employer]. Care instructions adapted under license by [facility]. This care instruction is for use with your licensed healthcare professional. If you have questions about a medical condition or this instruction, always ask your healthcare professional. [employer], [employer] disclaims any warranty or liability for your use of this information.  Content Version: 10.6.465758; Current as of: September 23, 2012

## 2014-04-05 NOTE — Progress Notes (Signed)
[facility]  [address]  [address] [place] [address]  Dept: [phone]  Dept Fax: [phone]    Kristen Gill is a 81 y.o. female who presents today for her medical conditions/complaints as noted below.  Kristen Gill is c/o of   Chief Complaint   Patient presents with   ??? 3 Month Follow-Up     DM     Diabetic visit information    BP Readings from Last 3 Encounters:   04/05/14 120/60   12/01/13 148/60   11/17/13 128/52       HEMOGLOBIN A1C (%)   Date Value   11/26/2013 6.2*   11/17/2013 6.4   08/21/2012 8.1*     MICROALB/CRT. RATIO (mcg/mg creat)   Date Value   11/26/2013 13     LDL CHOLESTEROL (mg/dL)   Date Value   11/26/2013 65               Are you taking your medications for Diabetes? -  Yes   Are you having any side effects from the Diabetes medications? - No    Are you taking any cholesterol medication?  -  Yes   Are you having any side effects from cholesterol medications? -  No    Are you taking an Aspirin daily? -  Yes             If yes, see medication list    Are you taking all your prescribed medications? No             If NO, why? -  Unsure of what she is taking    Are you taking any over-the-counter medicines, vitamins, or herbal medicines? no     Have you changed or stopped any medications since your last visit including any over-the-counter medicines, vitamins, or herbal medicines? yes - see med list     Have you seen any other physician or provider since your last visit no    Have you had any other diagnostic tests since your last visit? no    Have you had your annual diabetic retinal (eye) exam?  no    Diabetic Patient Self-Management Goal(s):    []  Increase activity-exercise for 30 min daily - 3 to 4 days per week   []  Improved diet - adhere to recommended diabetic diet/low cholesterol    []  Take medications as prescribed and call the office if any troubles with medications develop   []  will check blood sugars at least one  time daily   []  schedule an appointment with a diabetes educator   []  schedule yearly diabetic eye exam   []  will work on weight reduction program   []  follow up DM  Barriers to success: none  Plan for overcoming my barriers: N/A     Confidence: 10/10  Date goal set: 04/05/14  Date expected to reach goal: 1day                     Microalbumin performed if applicable? - No    Shoes and socks removed? - No   Monofilament placed on counter? - {YES/NO:[id number]    Tobacco use:  Patient  reports that she has never smoked. She has never used smokeless tobacco.     If Smoker - Cessation materials given? No        1-800-QUIT-NOW [phone])     Diabetic Health Maintenance  Health Maintenance Due  Topic Date Due   ??? ZOSTAVAX VACCINE  09/16/1990   ??? PNEUMO VAC >= 65 (2 of 2 - PCV13) 01/31/2013   ??? FLU VACCINE YEARLY (ADULT)  08/15/2013       There are no preventive care reminders to display for this patient.    Medical history Review  Past Medical, Family, and Social History reviewed and does contribute to the patient presenting condition        HPI:     HPI Comments: Here today for follow up on DM  Has been doing well, glucose levels stable  No episodes of hypoglycemia    Reports a worsening of her arthritis pain, is planning to go see her chiropractor about her hip  Would like rx for Motrin as has been using some that she was given by a family member and this helps    Diabetes  She presents for her follow-up diabetic visit. She has type 2 diabetes mellitus. Her disease course has been stable. There are no hypoglycemic associated symptoms. Pertinent negatives for hypoglycemia include no confusion, dizziness, headaches or nervousness/anxiousness. Associated symptoms include blurred vision, fatigue, foot paresthesias and visual change. Pertinent negatives for diabetes include no chest pain, no polydipsia, no polyphagia, no polyuria and no weakness. Symptoms are stable. Diabetic complications include nephropathy and  peripheral neuropathy. She is compliant with treatment most of the time. Her weight is stable. She is following a generally healthy diet. She participates in exercise intermittently. Her breakfast blood glucose range is generally 140-180 mg/dl. Her highest blood glucose is 180-200 mg/dl. An ACE inhibitor/angiotensin II receptor blocker is not being taken.       HEMOGLOBIN A1C (%)   Date Value   11/26/2013 6.2*   11/17/2013 6.4   08/21/2012 8.1*             ( goal A1C is < 7)   MICROALB/CRT. RATIO (mcg/mg creat)   Date Value   11/26/2013 13     LDL CHOLESTEROL (mg/dL)   Date Value   11/26/2013 65   01/26/2012 92   07/18/2010 156*       (goal LDL is <100)   AST (U/L)   Date Value   11/26/2013 20     ALT (U/L)   Date Value   11/26/2013 15     BUN (mg/dL)   Date Value   11/26/2013 24*     BP Readings from Last 3 Encounters:   04/05/14 120/60   12/01/13 148/60   11/17/13 128/52          (goal 120/80)    Past Medical History   Diagnosis Date   ??? IDDM (insulin dependent diabetes mellitus) ([place])    ??? Hyperlipidemia    ??? Hypertension    ??? Osteoarthritis    ??? Depression    ??? Hypothyroidism       Past Surgical History   Procedure Laterality Date   ??? Hysterectomy     ??? Cholecystectomy     ??? Thyroidectomy     ??? Colonoscopy  04/14/2008     severe ischemic colitis   ??? Colonoscopy  06/03/2008     redundant colon, a lot of adhesions, poor prep, diverticula   ??? Upper gastrointestinal endoscopy  2010     dilation   ??? Upper gastrointestinal endoscopy  10/14/2008     esophageal lesion, biopsy shows inflammatory changes   ??? Upper gastrointestinal endoscopy  09/12/2011     small ulcer upper part of stomach, reactive gastropathy  Family History   Problem Relation Age of Onset   ??? Other Mother      died at age 38 of old age   ??? Heart Disease Father    ??? Cancer Sister      lung   ??? Cancer Brother    ??? Birth Defects Brother      unknown site       History   Substance Use Topics   ??? Smoking status: Never Smoker    ??? Smokeless tobacco: Never  Used   ??? Alcohol Use: No      Current Outpatient Prescriptions   Medication Sig Dispense Refill   ??? ibuprofen (ADVIL;MOTRIN) 800 MG tablet Take 1 tablet by mouth every 6 hours as needed for Pain 120 tablet 3   ??? meloxicam (MOBIC) 7.5 MG tablet Take 1 tablet by mouth daily 30 tablet 3   ??? amitriptyline (ELAVIL) 75 MG tablet Take 1 tablet by mouth 2 times daily 180 tablet 2   ??? HUMULIN N 100 UNIT/ML injection vial INJECT 100 UNITS UNDER THE SKIN TWICE A DAY BEFORE MEALS 15 vial 2   ??? Insulin Syringe-Needle U-100 (B-D INS SYR ULTRAFINE 1CC/30G) 30G X 1/2" 1 ML MISC Inject 1 each into the skin 2 times daily 100 each 5   ??? glucagon 1 MG injection Inject 1 mg into the skin See Admin Instructions Use for symptomatic hypoglycemia only when patient is unable to take oral food or glucose tabs 1 kit 3   ??? levothyroxine (SYNTHROID) 150 MCG tablet TAKE 1 TABLET EVERY MORNING 90 tablet 2   ??? simvastatin (ZOCOR) 40 MG tablet TAKE 1 TABLET AT BEDTIME 90 tablet 3   ??? lisinopril (PRINIVIL;ZESTRIL) 10 MG tablet Take 1 tablet by mouth daily 90 tablet 3   ??? insulin NPH (HUMULIN N) 100 UNIT/ML injection vial Inject 32 Units into the skin every morning 15 vial 2   ??? insulin regular (HUMULIN R) 100 UNIT/ML injection Every evening with largest meal 3 vial 2   ??? aspirin 325 MG tablet Take 325 mg by mouth daily.     ??? ascorbic acid (VITAMIN C) 500 MG tablet Take 500 mg by mouth daily.     ??? Omega-3 Fatty Acids (OMEGA-3 FISH OIL PO) Take  by mouth.     ??? CALCIUM & MAGNESIUM CARBONATES PO Take  by mouth daily.     ??? ALOE VERA JUICE PO Take 2 oz by mouth 2 times daily.     ??? Lactobacillus CHEW Take 1 tablet by mouth 3 times daily.     ??? Cyanocobalamin ([employer] B-12 PO) Take  by mouth.     ??? omeprazole (PRILOSEC) 20 MG capsule Take 1 capsule by mouth Daily. 30 capsule 3     No current facility-administered medications for this visit.     Allergies   Allergen Reactions   ??? Pravastatin        Health Maintenance   Topic Date Due   ??? ZOSTAVAX VACCINE   09/16/1990   ??? PNEUMO VAC >= 65 (2 of 2 - PCV13) 01/31/2013   ??? FLU VACCINE YEARLY (ADULT)  08/15/2013   ??? FALLS RISK  10/27/2014   ??? DEPRESSION SCREENING  12/02/2014   ??? DEXA SCAN WOMEN  01/31/2017   ??? TETANUS VACCINE ADULT (11 YEARS AND UP)  01/31/2022       Subjective:      Review of Systems   Constitutional: Positive for fatigue. Negative for fever, chills, activity  change, appetite change and unexpected weight change.   HENT: Negative for congestion, ear pain, hearing loss, sinus pressure, sore throat and trouble swallowing.    Eyes: Positive for blurred vision. Negative for visual disturbance.   Respiratory: Negative for cough, shortness of breath and wheezing.    Cardiovascular: Negative for chest pain, palpitations and leg swelling.   Gastrointestinal: Negative for nausea, vomiting, abdominal pain, diarrhea, constipation and blood in stool.   Endocrine: Negative for cold intolerance, heat intolerance, polydipsia, polyphagia and polyuria.   Genitourinary: Negative for urgency, frequency, hematuria and difficulty urinating.   Musculoskeletal: Positive for myalgias and arthralgias.   Skin: Negative for rash.   Allergic/Immunologic: Negative for environmental allergies.   Neurological: Negative for dizziness, weakness, light-headedness and headaches.   Psychiatric/Behavioral: Negative for confusion. The patient is not nervous/anxious.        Objective:     Physical Exam   Constitutional: She is oriented to person, place, and time. She appears well-developed and well-nourished.   HENT:   Head: Normocephalic.   Eyes: Conjunctivae and EOM are normal. Pupils are equal, round, and reactive to light. Right eye exhibits no discharge.   Neck: Normal range of motion.   Cardiovascular: Normal rate, regular rhythm, normal heart sounds and intact distal pulses.    No murmur heard.  Pulmonary/Chest: Effort normal and breath sounds normal. She has no wheezes.   Abdominal: Soft. Bowel sounds are normal. She exhibits no  distension.   Musculoskeletal: Normal range of motion.   Neurological: She is alert and oriented to person, place, and time.   Skin: Skin is warm and dry.   Psychiatric: She has a normal mood and affect. Her behavior is normal. Judgment and thought content normal.     BP 120/60 mmHg   Pulse 88   Resp 18   Wt 152 lb 12.8 oz (69.31 kg)    Assessment:      1. IDDM (insulin dependent diabetes mellitus) ([place])     2. Primary osteoarthritis involving multiple joints  ibuprofen (ADVIL;MOTRIN) 800 MG tablet    meloxicam (MOBIC) 7.5 MG tablet   3. Depression     4. Acquired hypothyroidism               Plan:      Return in about 6 months (around 10/06/2014) for diabetes check, arthritis.      Orders Placed This Encounter   Medications   ??? ibuprofen (ADVIL;MOTRIN) 800 MG tablet     Sig: Take 1 tablet by mouth every 6 hours as needed for Pain     Dispense:  120 tablet     Refill:  3   ??? meloxicam (MOBIC) 7.5 MG tablet     Sig: Take 1 tablet by mouth daily     Dispense:  30 tablet     Refill:  3      Diabetes-well controlled, continue current medications, continue to check and record glucose level BID at home  Arthritis- start Mobic daily, Motrin prn, patient to follow up with chiropractor, call office if decides she would like to try PT   Patient given educational materials - see patient instructions.  Discussed use, benefit, and side effects of prescribed medications.  All patient questions answered. Pt voiced understanding. Reviewed health maintenance.  Instructed to continue current medications, diet and exercise.  Patient agreed with treatment plan. Follow up as directed.     Electronically signed by Jefferey Pica, CNP on 04/05/2014 at 5:19 PM

## 2014-05-20 NOTE — Telephone Encounter (Signed)
[facility] that patient is to use Meloxiccam daily and motrin as needed.

## 2014-05-20 NOTE — Telephone Encounter (Signed)
She is to use the motrin as needed and continue the meloxicam daily

## 2014-05-20 NOTE — Telephone Encounter (Signed)
Danne Harborubrey from [facility] called, asking if you want patient on Meloxicam and ibuprophen 800mg ?  Please advise.

## 2014-05-31 ENCOUNTER — Inpatient Hospital Stay: Attending: Family | Primary: Family

## 2014-05-31 ENCOUNTER — Ambulatory Visit: Admit: 2014-05-31 | Discharge: 2014-05-31 | Payer: MEDICARE | Attending: Family | Primary: Family

## 2014-05-31 DIAGNOSIS — R1013 Epigastric pain: Secondary | ICD-10-CM

## 2014-05-31 LAB — COMPREHENSIVE METABOLIC PANEL
ALT: 15 U/L (ref 5–33)
AST: 19 U/L (ref <32)
Albumin: 4.3 g/dL (ref 3.5–5.2)
Alkaline Phosphatase: 70 U/L (ref 35–104)
Anion Gap: 19 mmol/L — ABNORMAL HIGH (ref 9–17)
BUN: 29 mg/dL — ABNORMAL HIGH (ref 8–23)
CO2: 25 mmol/L (ref 20–31)
Calcium: 9.1 mg/dL (ref 8.6–10.4)
Chloride: 95 mmol/L — ABNORMAL LOW (ref 98–107)
Creatinine: 1.26 mg/dL — ABNORMAL HIGH (ref 0.50–0.90)
GFR African American: 49 mL/min — ABNORMAL LOW (ref >60)
GFR Non-African American: 41 mL/min — ABNORMAL LOW (ref >60)
Glucose: 336 mg/dL — ABNORMAL HIGH (ref 70–99)
Potassium: 4.5 mmol/L (ref 3.7–5.3)
Sodium: 139 mmol/L (ref 135–144)
Total Bilirubin: 0.69 mg/dL (ref 0.3–1.2)
Total Protein: 8 g/dL (ref 6.4–8.3)

## 2014-05-31 MED ORDER — ONDANSETRON 4 MG PO TBDP
4 MG | ORAL_TABLET | Freq: Three times a day (TID) | ORAL | Status: DC | PRN
Start: 2014-05-31 — End: 2015-10-13

## 2014-05-31 MED ORDER — OMEPRAZOLE 20 MG PO CPDR
20 MG | ORAL_CAPSULE | Freq: Two times a day (BID) | ORAL | Status: DC
Start: 2014-05-31 — End: 2014-06-16

## 2014-05-31 NOTE — Progress Notes (Signed)
[facility]  [address]  [address] [place] [address]  Dept: [phone]  Dept Fax: [phone]    Kristen Gill is a 81 y.o. female who presents today for her medical conditions/complaints as noted below.  Kristen Gill is c/o of   Chief Complaint   Patient presents with   ??? Emesis     since Thursday evening   ??? Pain     since Thursday-pain from right abdomen radiates to spine and up to  right shoulder blade     Have you changed or stopped any medications since your last visit including any over-the-counter medicines, vitamins, or herbal medicines? no     Are you taking all your prescribed medications? No          If NO, why? -  Stopped taking a lot of meds d/t emesis    Have you seen any other physician or provider since your last visit no    Have you had any other diagnostic tests since your last visit? no    Tobacco use:  Patient  reports that she has never smoked. She has never used smokeless tobacco.   If a smoker, cessation materials provided? NA   1-800-QUIT-NOW [phone])     Medical history Review  Past Medical, Family, and Social History reviewed and does contribute to the patient presenting condition    Health Maintenance Due   Topic Date Due   ??? ZOSTAVAX VACCINE  09/16/1990   ??? PNEUMO VAC >= 65 (2 of 2 - PCV13) 01/31/2013         HPI:     HPI Comments: Here today with nausea and vomiting since Thursday  Has significant GI history for esophageal stricture, ulcer and colitis  Has not seen GI in over 3 years  Has been drinking sips, eating a few bites but does not tolerate much  Frequent dry heaving and spitting up lith yellow fluid  Denies diarrhea  Daughter did call GI but they directed her here first, did say they could likely see her early next week  Blood sugars were high yesterday, in low 300's  217 this AM and did take her 20 units of NPH  Has been off the PPI for "a long time now"    Abdominal Pain  This is a new problem. The  current episode started in the past 7 days. The onset quality is sudden. The problem has been waxing and waning. The pain is located in the RUQ and epigastric region. The pain is at a severity of 9/10. The pain is severe. The quality of the pain is sharp. The abdominal pain radiates to the back. Associated symptoms include nausea and vomiting. Pertinent negatives include no arthralgias, constipation, diarrhea, fever, frequency, headaches, hematuria or myalgias. The pain is aggravated by eating. The pain is relieved by nothing. She has tried nothing for the symptoms. The treatment provided no relief. Her past medical history is significant for GERD and PUD.       HEMOGLOBIN A1C (%)   Date Value   11/26/2013 6.2*   11/17/2013 6.4   08/21/2012 8.1*             ( goal A1C is < 7)   MICROALB/CRT. RATIO (mcg/mg creat)   Date Value   11/26/2013 13     LDL CHOLESTEROL (mg/dL)   Date Value   11/26/2013 65   01/26/2012 92   07/18/2010 156*       (  goal LDL is <100)   AST (U/L)   Date Value   11/26/2013 20     ALT (U/L)   Date Value   11/26/2013 15     BUN (mg/dL)   Date Value   11/26/2013 24*     BP Readings from Last 3 Encounters:   05/31/14 144/68   04/05/14 120/60   12/01/13 148/60          (goal 120/80)    Past Medical History   Diagnosis Date   ??? IDDM (insulin dependent diabetes mellitus) ([place])    ??? Hyperlipidemia    ??? Hypertension    ??? Osteoarthritis    ??? Depression    ??? Hypothyroidism       Past Surgical History   Procedure Laterality Date   ??? Hysterectomy     ??? Cholecystectomy     ??? Thyroidectomy     ??? Colonoscopy  04/14/2008     severe ischemic colitis   ??? Colonoscopy  06/03/2008     redundant colon, a lot of adhesions, poor prep, diverticula   ??? Upper gastrointestinal endoscopy  2010     dilation   ??? Upper gastrointestinal endoscopy  10/14/2008     esophageal lesion, biopsy shows inflammatory changes   ??? Upper gastrointestinal endoscopy  09/12/2011     small ulcer upper part of stomach, reactive gastropathy       Family  History   Problem Relation Age of Onset   ??? Other Mother      died at age 68 of old age   ??? Heart Disease Father    ??? Cancer Sister      lung   ??? Cancer Brother    ??? Birth Defects Brother      unknown site       History   Substance Use Topics   ??? Smoking status: Never Smoker    ??? Smokeless tobacco: Never Used   ??? Alcohol Use: No      Current Outpatient Prescriptions   Medication Sig Dispense Refill   ??? omeprazole (PRILOSEC) 20 MG capsule Take 1 capsule by mouth 2 times daily 60 capsule 3   ??? ondansetron (ZOFRAN-ODT) 4 MG disintegrating tablet Take 1 tablet by mouth every 8 hours as needed for Nausea or Vomiting 30 tablet 1   ??? ibuprofen (ADVIL;MOTRIN) 800 MG tablet Take 1 tablet by mouth every 6 hours as needed for Pain 120 tablet 3   ??? amitriptyline (ELAVIL) 75 MG tablet Take 1 tablet by mouth 2 times daily 180 tablet 2   ??? Insulin Syringe-Needle U-100 (B-D INS SYR ULTRAFINE 1CC/30G) 30G X 1/2" 1 ML MISC Inject 1 each into the skin 2 times daily 100 each 5   ??? glucagon 1 MG injection Inject 1 mg into the skin See Admin Instructions Use for symptomatic hypoglycemia only when patient is unable to take oral food or glucose tabs 1 kit 3   ??? levothyroxine (SYNTHROID) 150 MCG tablet TAKE 1 TABLET EVERY MORNING 90 tablet 2   ??? simvastatin (ZOCOR) 40 MG tablet TAKE 1 TABLET AT BEDTIME 90 tablet 3   ??? lisinopril (PRINIVIL;ZESTRIL) 10 MG tablet Take 1 tablet by mouth daily 90 tablet 3   ??? insulin regular (HUMULIN R) 100 UNIT/ML injection Every evening with largest meal 3 vial 2   ??? aspirin 325 MG tablet Take 325 mg by mouth daily.     ??? ascorbic acid (VITAMIN C) 500 MG tablet Take 500 mg by  mouth daily.     ??? Omega-3 Fatty Acids (OMEGA-3 FISH OIL PO) Take  by mouth.     ??? CALCIUM & MAGNESIUM CARBONATES PO Take  by mouth daily.     ??? ALOE VERA JUICE PO Take 2 oz by mouth 2 times daily.     ??? Cyanocobalamin (CVS B-12 PO) Take  by mouth.     ??? meloxicam (MOBIC) 7.5 MG tablet Take 1 tablet by mouth daily 30 tablet 3   ??? HUMULIN  N 100 UNIT/ML injection vial INJECT 100 UNITS UNDER THE SKIN TWICE A DAY BEFORE MEALS (Patient taking differently: 32 units bid) 15 vial 2   ??? Lactobacillus CHEW Take 1 tablet by mouth 3 times daily.       No current facility-administered medications for this visit.     Allergies   Allergen Reactions   ??? Pravastatin        Health Maintenance   Topic Date Due   ??? ZOSTAVAX VACCINE  09/16/1990   ??? PNEUMO VAC >= 65 (2 of 2 - PCV13) 01/31/2013   ??? FLU VACCINE YEARLY (ADULT)  08/16/2014   ??? TETANUS VACCINE ADULT (11 YEARS AND UP)  01/31/2022   ??? DEXA SCAN WOMEN (modify frequency per FRAX score)  Addressed       Subjective:      Review of Systems   Constitutional: Positive for appetite change and fatigue. Negative for fever, chills, activity change and unexpected weight change.   HENT: Negative for congestion, ear pain, hearing loss, sinus pressure, sore throat and trouble swallowing.    Eyes: Negative for visual disturbance.   Respiratory: Negative for cough, shortness of breath and wheezing.    Cardiovascular: Negative for chest pain, palpitations and leg swelling.   Gastrointestinal: Positive for nausea, vomiting and abdominal pain. Negative for diarrhea, constipation and blood in stool.   Endocrine: Negative for cold intolerance, heat intolerance, polydipsia, polyphagia and polyuria.   Genitourinary: Negative for urgency, frequency, hematuria and difficulty urinating.   Musculoskeletal: Negative for myalgias and arthralgias.   Skin: Negative for rash.   Allergic/Immunologic: Negative for environmental allergies.   Neurological: Negative for dizziness, weakness, light-headedness and headaches.   Psychiatric/Behavioral: Negative for confusion. The patient is not nervous/anxious.        Objective:     Physical Exam   Constitutional: She is oriented to person, place, and time. She appears well-developed and well-nourished.   HENT:   Head: Normocephalic.   Eyes: Conjunctivae and EOM are normal. Pupils are equal, round, and  reactive to light. Right eye exhibits no discharge.   Neck: Normal range of motion.   Cardiovascular: Normal rate, regular rhythm, normal heart sounds and intact distal pulses.    No murmur heard.  Pulmonary/Chest: Effort normal and breath sounds normal. She has no wheezes.   Abdominal: Soft. Bowel sounds are normal. She exhibits no distension and no mass. There is tenderness in the right upper quadrant and epigastric area. There is no rebound.   Musculoskeletal: Normal range of motion.   Neurological: She is alert and oriented to person, place, and time.   Skin: Skin is warm and dry.   Psychiatric: She has a normal mood and affect. Her behavior is normal. Judgment and thought content normal.     BP 144/68 mmHg   Pulse 88   Resp 18    Assessment:      1. Abdominal pain, epigastric  [address] [facility], [place], [place], [place]  Comprehensive Metabolic Panel    Hepatic Function Panel   2. Non-intractable vomiting with nausea, vomiting of unspecified type  omeprazole (PRILOSEC) 20 MG capsule    ondansetron (ZOFRAN-ODT) 4 MG disintegrating tablet    Comprehensive Metabolic Panel    Hepatic Function Panel   3. IDDM (insulin dependent diabetes mellitus) ([place])               Plan:      Return in about 6 weeks (around 07/12/2014).    Orders Placed This Encounter   Procedures   ??? Comprehensive Metabolic Panel     Standing Status: Future      Number of Occurrences:       Standing Expiration Date: 05/31/2015   ??? Hepatic Function Panel     Standing Status: Future      Number of Occurrences:       Standing Expiration Date: 05/31/2015   ??? [facility], [place], [place], [place]     Referral Priority:  Routine     Referral Type:  Consult for Advice and Opinion     Referral Reason:  Specialty Services Required     Referred to Provider:  Feliberto Gottron, MD     Requested Specialty:  Gastroenterology     Number of Visits Requested:  1     Orders Placed This Encounter   Medications   ??? omeprazole (PRILOSEC) 20  MG capsule     Sig: Take 1 capsule by mouth 2 times daily     Dispense:  60 capsule     Refill:  3   ??? ondansetron (ZOFRAN-ODT) 4 MG disintegrating tablet     Sig: Take 1 tablet by mouth every 8 hours as needed for Nausea or Vomiting     Dispense:  30 tablet     Refill:  1      Abd pain, Nausea and vomiting-referral to GI, daughter to schedule appt today. Restart PPI, zofran for nausea, check labs. Sips of clear liquids as tolerated, bland soft foods as tolerated. Call if does not improve  DM-sugars elevated, daughter will continue to monitor closely due to patient poor intake for now, call office if persistent elevations occur   Patient given educational materials - see patient instructions.  Discussed use, benefit, and side effects of prescribed medications.  All patient questions answered. Pt voiced understanding. Reviewed health maintenance.  Instructed to continue current medications, diet and exercise.  Patient agreed with treatment plan. Follow up as directed.     Electronically signed by Jefferey Pica, CNP on 05/31/2014 at 2:55 PM

## 2014-05-31 NOTE — Patient Instructions (Signed)
Indigestion (Dyspepsia or Heartburn): Care Instructions  Your Care Instructions  Sometimes it can be hard to pinpoint the cause of indigestion (dyspepsia or heartburn). Most cases of an upset stomach with bloating, burning, burping, and nausea are minor and go away within several hours. Home treatment and over-the-counter medicine often are able to control symptoms. But if you take medicine to relieve your indigestion without making diet and lifestyle changes, your symptoms are likely to return again and again.  If you get indigestion often, it may be a sign of a more serious medical problem. Be sure to follow up with your doctor, who may want to do tests to be sure of the cause of your indigestion.  Follow-up care is a key part of your treatment and safety. Be sure to make and go to all appointments, and call your doctor if you are having problems. It???s also a good idea to know your test results and keep a list of the medicines you take.  How can you care for yourself at home?  ?? Your doctor may recommend over-the-counter medicine. For mild or occasional indigestion, antacids such as Tums, Gaviscon, Mylanta, or Maalox may help. Your doctor also may recommend over-the-counter acid reducers, such as Pepcid AC, Tagamet HB, Zantac 75, or Prilosec. Read and follow all instructions on the label. If you use these medicines often, talk with your doctor.  ?? Change your eating habits.  ?? It???s best to eat several small meals instead of two or three large meals.  ?? After you eat, wait 2 to 3 hours before you lie down.  ?? Chocolate, mint, and alcohol can make GERD worse.  ?? Spicy foods, foods that have a lot of acid (like tomatoes and oranges), and coffee can make GERD symptoms worse in some people. If your symptoms are worse after you eat a certain food, you may want to stop eating that food to see if your symptoms get better.  ?? Do not smoke or chew tobacco. Smoking can make GERD worse. If you need help quitting, talk to  your doctor about stop-smoking programs and medicines. These can increase your chances of quitting for good.  ?? If you have GERD symptoms at night, raise the head of your bed 6 to 8 inches by putting the frame on blocks or placing a foam wedge under the head of your mattress. (Adding extra pillows does not work.)  ?? Do not wear tight clothing around your middle.  ?? Lose weight if you need to. Losing just 5 to 10 pounds can help.  ?? Do not take anti-inflammatory medicines, such as aspirin, ibuprofen (Advil, Motrin), or naproxen (Aleve). These can irritate the stomach. If you need a pain medicine, try acetaminophen (Tylenol), which does not cause stomach upset.  When should you call for help?  Call 911 anytime you think you may need emergency care. For example, call if:  ?? You passed out (lost consciousness).  ?? You vomit blood or what looks like coffee grounds.  ?? You pass maroon or very bloody stools.  ?? You have chest pain or pressure. This may occur with:  ?? Sweating.  ?? Shortness of breath.  ?? Nausea or vomiting.  ?? Pain that spreads from the chest to the neck, jaw, or one or both shoulders or arms.  ?? Feeling dizzy or lightheaded.  ?? A fast or uneven pulse.  After calling 911, chew 1 adult-strength aspirin. Wait for an ambulance. Do not try to drive yourself.    Call your doctor now or seek immediate medical care if:  ?? You have severe belly pain.  ?? Your stools are black and tarlike or have streaks of blood.  ?? You have trouble swallowing.  ?? You are losing weight and do not know why.  Watch closely for changes in your health, and be sure to contact your doctor if:  ?? You do not get better as expected.   Where can you learn more?   Go to [email or web address] and sign in to your MyChart account. Enter [id number] in the Search Health Information box to learn more about ???Indigestion (Dyspepsia or Heartburn): Care Instructions.???    If you do not have an account, please click on the ???Sign Up Now??? link.      ?? 2006-2015 Healthwise, Inc[employer]. Care instructions adapted under license by [facility]. This care instruction is for use with your licensed healthcare professional. If you have questions about a medical condition or this instruction, always ask your healthcare professional. Healthwise, Incorporated disclaims any warranty or liability for your use of this information.  Content Version: 10.6.465758; Current as of: November 28, 2012              Nausea and Vomiting: Care Instructions  Your Care Instructions     When you are nauseated, you may feel weak and sweaty and notice a lot of saliva in your mouth. Nausea often leads to vomiting. Most of the time you do not need to worry about nausea and vomiting, but they can be signs of other illnesses.  Two common causes of nausea and vomiting are stomach flu and food poisoning. Nausea and vomiting from viral stomach flu will usually start to improve within 24 hours. Nausea and vomiting from food poisoning may last from 12 to 48 hours.  The doctor has checked you carefully, but problems can develop later. If you notice any problems or new symptoms, get medical treatment right away.  Follow-up care is a key part of your treatment and safety. Be sure to make and go to all appointments, and call your doctor if you are having problems. It's also a good idea to know your test results and keep a list of the medicines you take.  How can you care for yourself at home?  ?? To prevent dehydration, drink plenty of fluids, enough so that your urine is light yellow or clear like water. Choose water and other caffeine-free clear liquids until you feel better. If you have kidney, heart, or liver disease and have to limit fluids, talk with your doctor before you increase the amount of fluids you drink.  ?? Rest in bed until you feel better.  ?? When you are able to eat, try clear soups, mild foods, and liquids until all symptoms are gone for 12 to 48 hours. Other good choices include  dry toast, crackers, cooked cereal, and gelatin dessert, such as Jell-O.  When should you call for help?  Call 911 anytime you think you may need emergency care. For example, call if:  ?? You passed out (lost consciousness).  Call your doctor now or seek immediate medical care if:  ?? You have symptoms of dehydration, such as:  ?? Dry eyes and a dry mouth.  ?? Passing only a little dark urine.  ?? Feeling thirstier than usual.  ?? You have new or worsening belly pain.  ?? You have a new or higher fever.  ?? You vomit blood or what looks like coffee grounds.  Watch closely for changes in your health, and be sure to contact your doctor if:  ?? You have ongoing nausea and vomiting.  ?? Your vomiting is getting worse.  ?? Your vomiting lasts longer than 2 days.  ?? You are not getting better as expected.   Where can you learn more?   Go to [email or web address] and sign in to your MyChart account. Enter H591 in the Search Health Information box to learn more about ???Nausea and Vomiting: Care Instructions.???    If you do not have an account, please click on the ???Sign Up Now??? link.     ?? 2006-2015 Healthwise, Inc[employer]. Care instructions adapted under license by [facility]. This care instruction is for use with your licensed healthcare professional. If you have questions about a medical condition or this instruction, always ask your healthcare professional. Healthwise, Incorporated disclaims any warranty or liability for your use of this information.  Content Version: 10.6.465758; Current as of: Jun 05, 2013

## 2014-06-01 ENCOUNTER — Inpatient Hospital Stay
Admit: 2014-06-01 | Discharge: 2014-06-03 | Disposition: A | Discharge status: Home / Self Care | Payer: Medicare Other | Admitting: Family Medicine

## 2014-06-01 DIAGNOSIS — R1310 Dysphagia, unspecified: Secondary | ICD-10-CM

## 2014-06-01 LAB — POCT GLUCOSE: Glucose: 162 mg/dL

## 2014-06-01 LAB — BLOOD GAS, VENOUS
Carboxyhemoglobin: 2.2 % (ref 0.0–5.0)
HCO3, Venous: 30.6 mmol/L — ABNORMAL HIGH (ref 24.0–30.0)
Methemoglobin: 0.3 % (ref 0.0–1.9)
O2 Sat, Ven: 56.2 % — ABNORMAL LOW (ref 60.0–85.0)
Positive Base Excess, Ven: 5.5 mmol/L — ABNORMAL HIGH (ref 0.0–2.0)
Pt Temp: 37
pCO2, Ven: 51.2 (ref 39.0–55.0)
pH, Ven: 7.384 (ref 7.320–7.420)
pO2, Ven: 29.9 — ABNORMAL LOW (ref 30.0–50.0)

## 2014-06-01 LAB — CBC WITH AUTO DIFFERENTIAL
Absolute Eos #: 0.1 10*3/uL (ref 0.0–0.4)
Absolute Lymph #: 2.3 10*3/uL (ref 1.0–4.8)
Absolute Mono #: 0.9 10*3/uL (ref 0.1–1.3)
Basophils Absolute: 0.1 10*3/uL (ref 0.0–0.2)
Basophils: 1 % (ref 0–2)
Eosinophils %: 1 % (ref 0–4)
Hematocrit: 41.8 % (ref 36–46)
Hemoglobin: 13.8 g/dL (ref 12.0–16.0)
Lymphocytes: 21 % — ABNORMAL LOW (ref 24–44)
MCH: 29.8 pg (ref 26–34)
MCHC: 33.1 g/dL (ref 31–37)
MCV: 89.9 fL (ref 80–100)
MPV: 9.8 fL (ref 6.0–12.0)
Monocytes: 8 % — ABNORMAL HIGH (ref 1–7)
Platelets: 230 10*3/uL (ref 150–450)
RBC: 4.65 m/uL (ref 4.0–5.2)
RDW: 13.1 % (ref 11.5–14.9)
Seg Neutrophils: 69 % — ABNORMAL HIGH (ref 36–66)
Segs Absolute: 7.7 10*3/uL (ref 1.3–9.1)
WBC: 11 10*3/uL (ref 3.5–11.0)

## 2014-06-01 LAB — COMPREHENSIVE METABOLIC PANEL
ALT: 18 U/L (ref 5–33)
AST: 22 U/L (ref <32)
Albumin: 4.5 g/dL (ref 3.5–5.2)
Alkaline Phosphatase: 71 U/L (ref 35–104)
Anion Gap: 14 mmol/L (ref 9–17)
BUN: 31 mg/dL — ABNORMAL HIGH (ref 8–23)
CO2: 28 mmol/L (ref 20–31)
Calcium: 9.4 mg/dL (ref 8.6–10.4)
Chloride: 98 mmol/L (ref 98–107)
Creatinine: 1.14 mg/dL — ABNORMAL HIGH (ref 0.50–0.90)
GFR African American: 55 mL/min — ABNORMAL LOW (ref >60)
GFR Non-African American: 46 mL/min — ABNORMAL LOW (ref >60)
Glucose: 162 mg/dL — ABNORMAL HIGH (ref 70–99)
Potassium: 3.9 mmol/L (ref 3.7–5.3)
Sodium: 140 mmol/L (ref 135–144)
Total Bilirubin: 0.47 mg/dL (ref 0.3–1.2)
Total Protein: 8 g/dL (ref 6.4–8.3)

## 2014-06-01 LAB — BETA-HYDROXYBUTYRATE: Beta-Hydroxybutyrate: 0.57 mmol/L — ABNORMAL HIGH (ref 0.02–0.27)

## 2014-06-01 LAB — LIPASE: Lipase: 12 U/L — ABNORMAL LOW (ref 13–60)

## 2014-06-01 LAB — POC GLUCOSE FINGERSTICK: POC Glucose: 165 mg/dL — ABNORMAL HIGH (ref 65–105)

## 2014-06-01 MED ORDER — ONDANSETRON HCL 4 MG/2ML IJ SOLN
4 MG/2ML | Freq: Once | INTRAMUSCULAR | Status: AC
Start: 2014-06-01 — End: 2014-06-01
  Administered 2014-06-01: 23:00:00 4 mg via INTRAVENOUS

## 2014-06-01 MED ORDER — SODIUM CHLORIDE 0.9 % IV BOLUS
0.9 % | Freq: Once | INTRAVENOUS | Status: AC
Start: 2014-06-01 — End: 2014-06-01
  Administered 2014-06-01: 23:00:00 1000 mL via INTRAVENOUS

## 2014-06-01 MED FILL — ONDANSETRON HCL 4 MG/2ML IJ SOLN: 4 MG/2ML | INTRAMUSCULAR | Qty: 2

## 2014-06-01 NOTE — Telephone Encounter (Signed)
Phone call from pt's daughter [name] stating that pt is not keeping fluids or food down and will be going to ER

## 2014-06-01 NOTE — Progress Notes (Signed)
Paged at 10:40PM, med messaging will notify Dr and I added to list--Kristen Gill

## 2014-06-01 NOTE — Care Coordination-Inpatient (Signed)
ADMISSION NOTE       Patient admitted to room  2042.      Time of admit:  2040    Admit from:  ED    Reason for admission:  Dysphagia    Where patient has been residing for the last 24 hrs:  Private residence    Has the patient been admitted to any facility in the last 4 weeks, which one:  no    Family at bedside:  Yes daughters     Patient is currently no  Patient has been oriented to room, educated on how to use call light, and to call for assistance prior to getting up.  Bed in lowest and locked position.  2 siderails up for safety.  Call light within reach.  V.s.s., no s/s of distress noted. Admission database completed. Pt states has not taken any of her oral medications since Thursday, has been taking insulin and drinking eltye solution at home for dydration, since unable to swallow.       [facility] [place]  DVT Prophylaxis and Vaccine Status  Work Office managerList  Mandatory for all patients      Patient must be on both Chemical prophylaxis and Mechanical prophylaxis. If chemical/mechanical prophylaxis is not ordered, the physician must document a reason for not using prophylaxis     Chemical Prophylaxis  Is patient on chemical prophylaxis: Yes  If no chemical prophylaxis Is a order in for No Chemical VTE prophylaxis n/a  If no was the physician notified not applicable      Mechanical Prophylaxis  Is patient on mechanical prophylaxis, intermittent pneumatic compression device: no pt declined  If no was the physician notified not applicable        Pneumonia Vaccine  Vaccine indicated:  Up-to-date  If indicated was the vaccine given: not applicable    Influenza Vaccine (applicable from October through March):  Vaccine indicated: Not indicated  If indicated was the vaccine given: not applicable    Patient Education  Education completed on DVT prophylaxis: yes

## 2014-06-01 NOTE — ED Provider Notes (Signed)
[facility] MED SURG  eMERGENCY dEPARTMENT eNCOUnter  Resident    Pt Name: MARYLYNNE KEELIN  MRN: [id number]  [place] 09-Aug-1928  Date of evaluation: 06/01/2014  PCP:  Jefferey Pica, [place]       Chief Complaint   Patient presents with   ??? Dysphagia         HISTORY OF PRESENT ILLNESS    Micheline LATANZA PFEFFERKORN is a 81 y.o. female who presents with a one-week history of difficulty swallowing.  Patient reports esophageal strictures in the past which have been needed to be dilated approximately every 2-3 years.  Patient reports that she is 2 years out from most recent dilation by Dr. Towanda Malkin.  Patient reports that she vomits approximately 3-4 minutes after ingestion of anything orally.  She denies a ability to drink water, thickened liquids or solids at this current time.  Patient only reports nausea after eating and vomiting medially following eating.  She denies any bilious or bloody emesis.  Denies any diarrhea or constipation.  Patient does report decreased urination.  Denies any chest pain or shortness of breath.  Denies any foreign body sensation.      REVIEW OF SYSTEMS       Review of Systems   Constitutional: Negative for fever and chills.   HENT: Positive for trouble swallowing. Negative for sore throat.    Respiratory: Negative for chest tightness, shortness of breath and wheezing.    Cardiovascular: Negative for chest pain and palpitations.   Gastrointestinal: Positive for nausea and vomiting. Negative for abdominal pain, diarrhea and blood in stool.   Genitourinary: Negative for urgency, frequency, vaginal pain, menstrual problem and pelvic pain.   Musculoskeletal: Negative for neck pain and neck stiffness.   Skin: Negative for pallor, rash and wound.   Neurological: Negative for dizziness and light-headedness.   Psychiatric/Behavioral: Negative for confusion.   All other systems reviewed and are negative.       PAST MEDICAL HISTORY    has a past medical history of IDDM (insulin dependent diabetes  mellitus) ([place]); Hyperlipidemia; Hypertension; Osteoarthritis; Depression; Hypothyroidism; and Esophageal dilatation.  Reviewed and significant to this case    SURGICAL HISTORY      has past surgical history that includes Hysterectomy; Cholecystectomy; Thyroidectomy; Colonoscopy (04/14/2008); Colonoscopy (06/03/2008); Upper gastrointestinal endoscopy (2010); Upper gastrointestinal endoscopy (10/14/2008); and Upper gastrointestinal endoscopy (09/12/2011).  Reviewed ane significant to this case    CURRENT MEDICATIONS       Current Discharge Medication List      CONTINUE these medications which have NOT CHANGED    Details   Cholecalciferol (VITAMIN D) 2000 UNITS CAPS capsule Take 2,000 Units by mouth daily      calcium & magnesium carbonates (MYLANTA) 311-232 MG per tablet Take 1 tablet by mouth daily      Lutein 20 MG TABS Take 20 mg by mouth daily      aspirin 325 MG tablet Take 325 mg by mouth daily      omeprazole (PRILOSEC) 20 MG capsule Take 1 capsule by mouth 2 times daily  Qty: 60 capsule, Refills: 3    Associated Diagnoses: Non-intractable vomiting with nausea, vomiting of unspecified type      ondansetron (ZOFRAN-ODT) 4 MG disintegrating tablet Take 1 tablet by mouth every 8 hours as needed for Nausea or Vomiting  Qty: 30 tablet, Refills: 1    Associated Diagnoses: Non-intractable vomiting with nausea, vomiting of unspecified type      ibuprofen (ADVIL;MOTRIN)  800 MG tablet Take 1 tablet by mouth every 6 hours as needed for Pain  Qty: 120 tablet, Refills: 3    Associated Diagnoses: Primary osteoarthritis involving multiple joints      amitriptyline (ELAVIL) 75 MG tablet Take 1 tablet by mouth 2 times daily  Qty: 180 tablet, Refills: 2      HUMULIN N 100 UNIT/ML injection vial INJECT 100 UNITS UNDER THE SKIN TWICE A DAY BEFORE MEALS  Qty: 15 vial, Refills: 2      glucagon 1 MG injection Inject 1 mg into the skin See Admin Instructions Use for symptomatic hypoglycemia only when patient is unable to take oral food  or glucose tabs  Qty: 1 kit, Refills: 3    Associated Diagnoses: IDDM (insulin dependent diabetes mellitus) (H[facility])      levothyroxine (SYNTHROID) 150 MCG tablet TAKE 1 TABLET EVERY MORNING  Qty: 90 tablet, Refills: 2    Associated Diagnoses: Acquired hypothyroidism      simvastatin (ZOCOR) 40 MG tablet TAKE 1 TABLET AT BEDTIME  Qty: 90 tablet, Refills: 3    Associated Diagnoses: Hyperlipidemia      lisinopril (PRINIVIL;ZESTRIL) 10 MG tablet Take 1 tablet by mouth daily  Qty: 90 tablet, Refills: 3    Associated Diagnoses: CKD (chronic kidney disease) stage 3, GFR 30-59 ml/min; Essential hypertension      insulin regular (HUMULIN R) 100 UNIT/ML injection Every evening with largest meal  Qty: 3 vial, Refills: 2      ascorbic acid (VITAMIN C) 500 MG tablet Take 500 mg by mouth daily.      Omega-3 Fatty Acids (OMEGA-3 FISH OIL PO) Take 1,000 mg by mouth daily       Lactobacillus CHEW Take 1 tablet by mouth 3 times daily.      Cyanocobalamin ([employer] B-12 PO) Take 2 tablets by mouth daily       Insulin Syringe-Needle U-100 (B-D INS SYR ULTRAFINE 1CC/30G) 30G X 1/2" 1 ML MISC Inject 1 each into the skin 2 times daily  Qty: 100 each, Refills: 5             ALLERGIES     is allergic to pravastatin.    FAMILY HISTORY     indicated that her mother is deceased. She indicated that her father is deceased. She indicated that her sister is deceased. She indicated that her brother is deceased.    family history includes Birth Defects in her brother; Cancer in her brother and sister; Heart Disease in her father; Other in her mother.  Noncontributory    SOCIAL HISTORY      reports that she has never smoked. She has never used smokeless tobacco. She reports that she does not drink alcohol or use illicit drugs.    PHYSICAL EXAM     INITIAL VITALS:  height is 5' 5"  (1.651 m) and weight is 146 lb 6.2 oz (66.4 kg). Her oral temperature is 98.7 ??F (37.1 ??C). Her blood pressure is 150/53 and her pulse is 85. Her respiration is 16 and oxygen  saturation is 92%.      Physical Exam   Constitutional: She is oriented to person, place, and time. She appears well-developed and well-nourished.   HENT:   Head: Normocephalic and atraumatic.   Mouth/Throat: Oropharynx is clear and moist.   Eyes: Conjunctivae and EOM are normal.   Neck: Neck supple. No JVD present.   Cardiovascular: Normal rate, regular rhythm and normal heart sounds.    No murmur heard.  Pulmonary/Chest: Effort normal and breath sounds normal. No respiratory distress. She has no wheezes.   Abdominal: Soft. Bowel sounds are normal. She exhibits no distension. There is no tenderness.   Neurological: She is alert and oriented to person, place, and time.   Skin: Skin is warm and dry.   Nursing note and vitals reviewed.        DIFFERENTIAL DIAGNOSIS/MDM:   81 year old female with known history of esophageal strictures requiring dilation in the past reported similar symptoms.  Patient also has a history of diabetes and reports elevation in blood sugar over the past several days as high as 370s.  We'll perform workup to include CBC, CMP, lipase, serum ketones, VBG.  Differential diagnosis includes esophageal strictures, gastroparesis, gastritis.  Patient was slight elevation BUN and creatinine consistent with dehydration likely secondary to decreased oral intake.  Slight elevation of beta hydroxybutyrate and not to the level of ketosis.  Slight elevation in glucose.  The patient is felt not to be in gastroparesis or DKA at this current time.  VBG showing no acidosis.  Patient likely with esophageal stricture inability to take oral intake at home at this current time.  We'll admit to [facility] for further evaluation and treatment by GI for potential for esophageal dilation.    DIAGNOSTIC RESULTS     EKG: All EKG's are interpreted by the Emergency Department Physician who either signs or Co-signs this chart in the absence of a cardiologist.    Not clinically indicated.       RADIOLOGY:   I directly  visualized the following  images and reviewed the radiologist interpretations:  No orders to display       Not clinically indicated.       ED BEDSIDE ULTRASOUND:   Not clinically indicated.       LABS:  Labs Reviewed   CBC WITH AUTO DIFFERENTIAL - Abnormal; Notable for the following:     Seg Neutrophils 69 (*)     Lymphocytes 21 (*)     Monocytes 8 (*)     All other components within normal limits   COMPREHENSIVE METABOLIC PANEL - Abnormal; Notable for the following:     Glucose 162 (*)     BUN 31 (*)     CREATININE 1.14 (*)     GFR Non-African American 46 (*)     GFR African American 55 (*)     All other components within normal limits   LIPASE - Abnormal; Notable for the following:     Lipase 12 (*)     All other components within normal limits   BETA-HYDROXYBUTYRATE - Abnormal; Notable for the following:     Beta-Hydroxybutyrate 0.57 (*)     All other components within normal limits   BLOOD GAS, VENOUS - Abnormal; Notable for the following:     pO2, Ven 29.9 (*)     HCO3, Venous 30.6 (*)     Positive Base Excess, Ven 5.5 (*)     O2 Sat, Ven 56.2 (*)     All other components within normal limits   POC GLUCOSE FINGERSTICK - Abnormal; Notable for the following:     POC Glucose 165 (*)     All other components within normal limits   POC GLUCOSE FINGERSTICK - Abnormal; Notable for the following:     POC Glucose 128 (*)     All other components within normal limits   POCT GLUCOSE - Normal   BASIC METABOLIC PANEL   CBC  Slight dehydration noted.  Slight elevation of beta hydroxybutyrate but not the point of ketosis.      EMERGENCY DEPARTMENT COURSE:   Vitals:    Filed Vitals:    06/01/14 1709 06/01/14 2046 06/01/14 2343   BP: 163/52 145/60 150/53   Pulse: 94 88 85   Temp: 98.3 ??F (36.8 ??C) 97.9 ??F (36.6 ??C) 98.7 ??F (37.1 ??C)   TempSrc: Oral Oral Oral   Resp: 18 16 16    Height: 5' 5"  (1.651 m)     Weight: 152 lb (68.947 kg) 146 lb 6.2 oz (66.4 kg)    SpO2: 98% 98% 92%     Pt seen and evaluated by me and the  attending physician. Initial orders placed. See MDM.     CRITICAL CARE:  Please see attending note, none.    CONSULTS:  IP CONSULT TO HOSPITALIST  IP CONSULT TO GI  IP CONSULT TO DIETITIAN      PROCEDURES:  Not clinically indicated.       FINAL IMPRESSION      1. Dysphagia, unspecified dysphagia            DISPOSITION/PLAN   DISPOSITION Admitted        PATIENT REFERRED TO:  No follow-up provider specified.    DISCHARGE MEDICATIONS:  Current Discharge Medication List          (Please note that portions of this note were completed with a voice recognition program.  Efforts were made to edit the dictations but occasionally words are mis-transcribed.)    Merrie Roof, DO  Emergency Medicine Resident              Merrie Roof, DO  Resident  06/02/14 [phone]

## 2014-06-01 NOTE — ED Notes (Signed)
Clinical Pharmacist Medication Reconciliation    List of medications patient is currently taking is complete.  Source of medications in list is patient and daughters.      Please let me know if you have any questions about this encounter. Thanks!    Audry PiliJanee Whitner Hiram ComberVer Vaet, PharmD, Doreen BeamBCPS, RPh  ED Pharmacist  Pager: [phone]  06/01/2014  6:12 PM

## 2014-06-01 NOTE — ED Notes (Signed)
Dr Tera HelperBoggs updated on pt hx of needing dilation and not flu sx.     Katina Dungeresa Buck, RN  06/01/14 [phone]

## 2014-06-01 NOTE — Telephone Encounter (Signed)
-----   Message from Michelle Piperiane J Kielmeyer, CNP sent at 05/31/2014  5:56 PM EDT -----  Please call and check on how she is doing with drinking since starting the medications she was given today. Her labs indicate some dehydration, she needs to increase fluid intake. Blood sugar also elevated. If she is still no tolerating food/fluids may need further evaluation in ED.

## 2014-06-01 NOTE — ED Provider Notes (Signed)
[facility] ED  eMERGENCY dEPARTMENT eNCOUnter   Attending Attestation     Pt Name: Kristen StampsRosetta M Gill  MRN: [id number]  Birthdate 04/22/1928  Date of evaluation: 06/01/14       Kristen Gill is a 81 y.o. female who presents with Dysphagia      History:   81 year old female presents with history of dysphagia.  Patient has chronic issues with aphasia.  She chronically has to get scoped by GI.  His complaint is no different than normal.  She is otherwise alert, oriented, appropriate.  It sounds like she is about due for another dilatation.  She is having problems swallowing food.  Otherwise she is stable.  Patient be worked up.  In the emergency department and most likely sent for outpatient follow-up with GI.    Exam: Vitals:   Filed Vitals:    06/01/14 1709   BP: 163/52   Pulse: 94   Temp: 98.3 ??F (36.8 ??C)   TempSrc: Oral   Resp: 18   Height: 5\' 5"  (1.651 m)   Weight: 152 lb (68.947 kg)   SpO2: 98%         I performed a history and physical examination of the patient and discussed management with the resident. I reviewed the resident???s note and agree with the documented findings and plan of care. Any areas of disagreement are noted on the chart. I was personally present for the key portions of any procedures. I have documented in the chart those procedures where I was not present during the key portions. I have personally reviewed all images and agree with the resident's interpretation. I have reviewed the emergency nurses triage note. I agree with the chief complaint, past medical history, past surgical history, allergies, medications, social and family history as documented unless otherwise noted below. Documentation of the HPI, Physical Exam and Medical Decision Making performed by medical students or scribes is based on my personal performance of the HPI, PE and MDM. For Phys Assistant/ Nurse Practitioner cases/documentation I have had a face to face evaluation of this patient and have completed at least one  if not all key elements of the E/M (history, physical exam, and MDM). Additional findings are as noted.    Antony Odeahomas R. Landy Mace, D.O.  Attending Emergency  Physician    Antony Odeahomas R Kolbi Altadonna, DO  06/01/14 [phone]

## 2014-06-02 ENCOUNTER — Encounter: Admit: 2014-06-02 | Payer: MEDICARE | Attending: Gastroenterology | Primary: Family

## 2014-06-02 LAB — BASIC METABOLIC PANEL
Anion Gap: 16 mmol/L (ref 9–17)
BUN: 24 mg/dL — ABNORMAL HIGH (ref 8–23)
CO2: 24 mmol/L (ref 20–31)
Calcium: 8.6 mg/dL (ref 8.6–10.4)
Chloride: 103 mmol/L (ref 98–107)
Creatinine: 0.96 mg/dL — ABNORMAL HIGH (ref 0.50–0.90)
GFR African American: >60 mL/min (ref >60)
GFR Non-African American: 56 mL/min — ABNORMAL LOW (ref >60)
Glucose: 130 mg/dL — ABNORMAL HIGH (ref 70–99)
Potassium: 3.6 mmol/L — ABNORMAL LOW (ref 3.7–5.3)
Sodium: 143 mmol/L (ref 135–144)

## 2014-06-02 LAB — CBC
Hematocrit: 39.6 % (ref 36–46)
Hemoglobin: 12.9 g/dL (ref 12.0–16.0)
MCH: 29.8 pg (ref 26–34)
MCHC: 32.7 g/dL (ref 31–37)
MCV: 91.2 fL (ref 80–100)
MPV: 9.8 fL (ref 6.0–12.0)
Platelets: 229 10*3/uL (ref 150–450)
RBC: 4.34 m/uL (ref 4.0–5.2)
RDW: 13.2 % (ref 11.5–14.9)
WBC: 11.2 10*3/uL — ABNORMAL HIGH (ref 3.5–11.0)

## 2014-06-02 LAB — POC GLUCOSE FINGERSTICK
POC Glucose: 128 mg/dL — ABNORMAL HIGH (ref 65–105)
POC Glucose: 128 mg/dL — ABNORMAL HIGH (ref 65–105)
POC Glucose: 160 mg/dL — ABNORMAL HIGH (ref 65–105)
POC Glucose: 183 mg/dL — ABNORMAL HIGH (ref 65–105)

## 2014-06-02 MED ORDER — PANTOPRAZOLE SODIUM 40 MG PO TBEC
40 MG | Freq: Every day | ORAL | Status: DC
Start: 2014-06-02 — End: 2014-06-02

## 2014-06-02 MED ORDER — ENOXAPARIN SODIUM 40 MG/0.4ML SC SOLN
40 MG/0.4ML | Freq: Every day | SUBCUTANEOUS | Status: DC
Start: 2014-06-02 — End: 2014-06-02

## 2014-06-02 MED ORDER — SODIUM CHLORIDE 0.9 % IV SOLN
0.9 % | INTRAVENOUS | Status: DC
Start: 2014-06-02 — End: 2014-06-03
  Administered 2014-06-02 – 2014-06-03 (×4): via INTRAVENOUS

## 2014-06-02 MED ORDER — POTASSIUM CHLORIDE 10 MEQ/100ML IV SOLN
10 MEQ/0ML | INTRAVENOUS | Status: DC | PRN
Start: 2014-06-02 — End: 2014-06-03

## 2014-06-02 MED ORDER — NORMAL SALINE FLUSH 0.9 % IV SOLN
0.9 % | Freq: Two times a day (BID) | INTRAVENOUS | Status: DC
Start: 2014-06-02 — End: 2014-06-03

## 2014-06-02 MED ORDER — ACETAMINOPHEN 160 MG/5ML PO SOLN
160 MG/5ML | ORAL | Status: DC | PRN
Start: 2014-06-02 — End: 2014-06-03
  Administered 2014-06-02: 21:00:00 650 mg via ORAL

## 2014-06-02 MED ORDER — DOCUSATE SODIUM 100 MG PO CAPS
100 MG | Freq: Two times a day (BID) | ORAL | Status: DC | PRN
Start: 2014-06-02 — End: 2014-06-03

## 2014-06-02 MED ORDER — PANTOPRAZOLE SODIUM 40 MG IV SOLR
40 MG | Freq: Every day | INTRAVENOUS | Status: DC
Start: 2014-06-02 — End: 2014-06-03
  Administered 2014-06-02 – 2014-06-03 (×2): 40 mg via INTRAVENOUS

## 2014-06-02 MED ORDER — MORPHINE SULFATE (PF) 2 MG/ML IV SOLN
2 MG/ML | INTRAVENOUS | Status: DC | PRN
Start: 2014-06-02 — End: 2014-06-03
  Administered 2014-06-02 (×2): 1 mg via INTRAVENOUS

## 2014-06-02 MED ORDER — ONDANSETRON HCL 4 MG/2ML IJ SOLN
4 MG/2ML | Freq: Four times a day (QID) | INTRAMUSCULAR | Status: DC | PRN
Start: 2014-06-02 — End: 2014-06-03

## 2014-06-02 MED ORDER — LEVOTHYROXINE SODIUM 150 MCG PO TABS
150 MCG | Freq: Every day | ORAL | Status: DC
Start: 2014-06-02 — End: 2014-06-03
  Administered 2014-06-02 – 2014-06-03 (×2): 150 ug via ORAL

## 2014-06-02 MED ORDER — POTASSIUM CHLORIDE CRYS ER 20 MEQ PO TBCR
20 MEQ | ORAL | Status: DC | PRN
Start: 2014-06-02 — End: 2014-06-03

## 2014-06-02 MED ORDER — NORMAL SALINE FLUSH 0.9 % IV SOLN
0.9 % | INTRAVENOUS | Status: DC | PRN
Start: 2014-06-02 — End: 2014-06-03

## 2014-06-02 MED ORDER — AMITRIPTYLINE HCL 75 MG PO TABS
75 MG | Freq: Two times a day (BID) | ORAL | Status: DC
Start: 2014-06-02 — End: 2014-06-03
  Administered 2014-06-02 – 2014-06-03 (×2): 75 mg via ORAL

## 2014-06-02 MED ORDER — MAGNESIUM SULFATE IN D5W 10-5 MG/ML-% IV SOLN
10-5 MG/ML-% | INTRAVENOUS | Status: DC | PRN
Start: 2014-06-02 — End: 2014-06-03

## 2014-06-02 MED ORDER — ACETAMINOPHEN 325 MG PO TABS
325 MG | ORAL | Status: DC | PRN
Start: 2014-06-02 — End: 2014-06-03

## 2014-06-02 MED ORDER — ONDANSETRON 4 MG PO TBDP
4 MG | Freq: Three times a day (TID) | ORAL | Status: DC | PRN
Start: 2014-06-02 — End: 2014-06-03

## 2014-06-02 MED ORDER — SODIUM CHLORIDE 0.9 % IJ SOLN
0.9 % | Freq: Every day | INTRAMUSCULAR | Status: DC
Start: 2014-06-02 — End: 2014-06-03
  Administered 2014-06-02 – 2014-06-03 (×2): 10 mL via INTRAVENOUS

## 2014-06-02 MED ORDER — LISINOPRIL 10 MG PO TABS
10 MG | Freq: Every day | ORAL | Status: DC
Start: 2014-06-02 — End: 2014-06-03
  Administered 2014-06-02 – 2014-06-03 (×2): 10 mg via ORAL

## 2014-06-02 MED ORDER — MAGNESIUM HYDROXIDE 400 MG/5ML PO SUSP
400 MG/5ML | Freq: Every day | ORAL | Status: DC | PRN
Start: 2014-06-02 — End: 2014-06-03

## 2014-06-02 MED ORDER — POTASSIUM CHLORIDE 20 MEQ/15ML (10%) PO SOLN
20 MEQ/15ML (10%) | ORAL | Status: DC | PRN
Start: 2014-06-02 — End: 2014-06-03

## 2014-06-02 MED FILL — PANTOPRAZOLE SODIUM 40 MG PO TBEC: 40 MG | ORAL | Qty: 1

## 2014-06-02 MED FILL — AMITRIPTYLINE HCL 75 MG PO TABS: 75 MG | ORAL | Qty: 1

## 2014-06-02 MED FILL — ACETAMINOPHEN 160 MG/5ML PO SOLN: 160 MG/5ML | ORAL | Qty: 20.3

## 2014-06-02 MED FILL — DERMOPLAST 20-0.5 % EX AERO: CUTANEOUS | Qty: 56

## 2014-06-02 MED FILL — SODIUM CHLORIDE 0.9 % IJ SOLN: 0.9 % | INTRAMUSCULAR | Qty: 10

## 2014-06-02 MED FILL — PROTONIX 40 MG IV SOLR: 40 MG | INTRAVENOUS | Qty: 40

## 2014-06-02 MED FILL — LISINOPRIL 10 MG PO TABS: 10 MG | ORAL | Qty: 1

## 2014-06-02 MED FILL — MORPHINE SULFATE (PF) 2 MG/ML IV SOLN: 2 mg/mL | INTRAVENOUS | Qty: 1

## 2014-06-02 MED FILL — SYNTHROID 150 MCG PO TABS: 150 MCG | ORAL | Qty: 1

## 2014-06-02 MED FILL — MIDAZOLAM HCL 2 MG/2ML IJ SOLN: 2 MG/ML | INTRAMUSCULAR | Qty: 4

## 2014-06-02 MED FILL — FENTANYL CITRATE (PF) 100 MCG/2ML IJ SOLN: 100 MCG/2ML | INTRAMUSCULAR | Qty: 2

## 2014-06-02 NOTE — Plan of Care (Signed)
Problem: Falls - Risk of  Goal: Absence of falls  Outcome: Ongoing  Adequate pain control achieved this shift. See MAR.    Problem: ABCDS Injury Assessment  Goal: Absence of physical injury  Outcome: Ongoing  Pt. Free of falls and injuries this shift.    Problem: Infection:  Goal: Will remain free from infection  Will remain free from infection   Outcome: Ongoing  No temp today. No fever, chills, cough, or signs of infection.    Problem: Daily Care:  Goal: Daily care needs are met  Daily care needs are met   Outcome: Ongoing  All daily care needs met today.    Problem: Pain:  Goal: Patient???s pain/discomfort is manageable  Patient???s pain/discomfort is manageable   Outcome: Ongoing  Adequate pain control achieved this shift. See MAR.

## 2014-06-02 NOTE — Plan of Care (Signed)
Problem: Falls - Risk of  Goal: Absence of falls  Outcome: Met This Shift  Pt. Free of falls and injuries this shift.    Problem: ABCDS Injury Assessment  Goal: Absence of physical injury  Outcome: Met This Shift  Pt remained free from accidental physical injury.    Problem: Infection:  Goal: Will remain free from infection  Will remain free from infection   Outcome: Met This Shift  Pt remains free from signs and symptoms of infection.     Problem: Daily Care:  Goal: Daily care needs are met  Daily care needs are met   Outcome: Ongoing    Problem: Pain:  Goal: Patient???s pain/discomfort is manageable  Patient???s pain/discomfort is manageable   Outcome: Ongoing  Adequate pain control achieved this shift. See MAR.    Problem: Discharge Planning:  Goal: Patients continuum of care needs are met  Patients continuum of care needs are met   Outcome: Ongoing

## 2014-06-02 NOTE — Progress Notes (Addendum)
Spoke to Kizzie Fantasiaheryl Jeffers, NP about patient's BS being 319 and no insulin ordered. She states she will place orders for coverage. No new orders for this writer to place at this time.

## 2014-06-02 NOTE — Progress Notes (Signed)
Page out to Dr Tish Fredericksonifuentes regarding patient's high BS and no coverage.

## 2014-06-02 NOTE — H&P (Signed)
HISTORY and [place]         NAME:  Kristen Gill  MRN: [id number]   Date of Birth:  21-Feb-1928   Date: 06/02/2014   Age: 81 y.o.  Gender: female       COMPLAINT AND PRESENT HISTORY:    68 y o female came to ED with dysphagia since last Thursday.  Patient reports esophageal strictures in the past which have been needed to be dilated approximately every 2-3 years. Patient reports that she is 2 years out from most recent dilation by Dr. Towanda Malkin. Patient reports that she vomits approximately 3-4 minutes after ingestion of anything orally. She denies inability to drink water, thickened liquids or solids at this current time. Patient only reports nausea after eating and vomiting after eating. She denies any bilious or bloody emesis. Denies any diarrhea or constipation. Patient does report decreased urination. Denies any chest pain or shortness of breath. Denies any foreign body sensation.          DIAGNOSTIC RESULTS   Radiology:         Labs:  CBC:   Recent Labs      06/01/14   1850  06/02/14   0530   WBC  11.0  11.2*   HGB  13.8  12.9   PLT  230  229     BMP:    Recent Labs      05/31/14   1522  06/01/14   1828  06/01/14   1850  06/02/14   0530   NA  139   --   140  143   K  4.5   --   3.9  3.6*   CL  95*   --   98  103   CO2  25   --   28  24   BUN  29*   --   31*  24*   CREATININE  1.26*   --   1.14*  0.96*   GLUCOSE  336*  162  162*  130*     Hepatic:   Recent Labs      05/31/14   1522  06/01/14   1850   AST  19  22   ALT  15  18   BILITOT  0.69  0.47   ALKPHOS  70  71     CARDIAC ENZY: No results for input(s): CKTOTAL, CKMB, CKMBINDEX, TROPONINT, MYOGLOBIN in the last 72 hours.  INR: No results for input(s): INR in the last 72 hours.  BNP: No results for input(s): BNP in the last 72 hours.  ABGs: No results found for: PHART, PO2ART, PCO2ART  Lipids: No results for input(s): CHOL, HDL in the last 72 hours.    Invalid input(s): LDLCALCU  U/A:No results found for: NITRITE, COLORU, WBCUA,  RBCUA, MUCUS, BACTERIA, CLARITYU, SPECGRAV, LEUKOCYTESUR, BLOODU, GLUCOSEU, AMORPHOUS    PAST MEDICAL HISTORY     Past Medical History   Diagnosis Date   ??? IDDM (insulin dependent diabetes mellitus) ([place])    ??? Hyperlipidemia    ??? Hypertension    ??? Osteoarthritis    ??? Depression    ??? Hypothyroidism    ??? Esophageal dilatation        Pt denies any history of  stroke, heart disease, COPD, Asthma, GERD, Cancer, Seizures, Kidney Disease, Hepatitis, TB, Psychiatric Disorders or Substance abuse.    SURGICAL HISTORY       Past Surgical History  Procedure Laterality Date   ??? Hysterectomy     ??? Cholecystectomy     ??? Thyroidectomy     ??? Colonoscopy  04/14/2008     severe ischemic colitis   ??? Colonoscopy  06/03/2008     redundant colon, a lot of adhesions, poor prep, diverticula   ??? Upper gastrointestinal endoscopy  2010     dilation   ??? Upper gastrointestinal endoscopy  10/14/2008     esophageal lesion, biopsy shows inflammatory changes   ??? Upper gastrointestinal endoscopy  09/12/2011     small ulcer upper part of stomach, reactive gastropathy       FAMILY HISTORY       Family History   Problem Relation Age of Onset   ??? Other Mother      died at age 63 of old age   ??? Heart Disease Father    ??? Cancer Sister      lung   ??? Cancer Brother    ??? Birth Defects Brother      unknown site       SOCIAL HISTORY       History     Social History   ??? Marital Status: Widowed     Spouse Name: N/A     Number of Children: N/A   ??? Years of Education: N/A     Social History Main Topics   ??? Smoking status: Never Smoker    ??? Smokeless tobacco: Never Used   ??? Alcohol Use: No   ??? Drug Use: No   ??? Sexual Activity: None     Other Topics Concern   ??? Lives with daughter     Social History Narrative           REVIEW OF SYSTEMS      Allergies   Allergen Reactions   ??? Pravastatin      Pt states read about and refuses to take        No current facility-administered medications on file prior to encounter.     Current Outpatient Prescriptions on File Prior to  Encounter   Medication Sig Dispense Refill   ??? omeprazole (PRILOSEC) 20 MG capsule Take 1 capsule by mouth 2 times daily 60 capsule 3   ??? ondansetron (ZOFRAN-ODT) 4 MG disintegrating tablet Take 1 tablet by mouth every 8 hours as needed for Nausea or Vomiting 30 tablet 1   ??? ibuprofen (ADVIL;MOTRIN) 800 MG tablet Take 1 tablet by mouth every 6 hours as needed for Pain 120 tablet 3   ??? amitriptyline (ELAVIL) 75 MG tablet Take 1 tablet by mouth 2 times daily 180 tablet 2   ??? HUMULIN N 100 UNIT/ML injection vial INJECT 100 UNITS UNDER THE SKIN TWICE A DAY BEFORE MEALS (Patient taking differently: 30 units BID) 15 vial 2   ??? glucagon 1 MG injection Inject 1 mg into the skin See Admin Instructions Use for symptomatic hypoglycemia only when patient is unable to take oral food or glucose tabs 1 kit 3   ??? levothyroxine (SYNTHROID) 150 MCG tablet TAKE 1 TABLET EVERY MORNING 90 tablet 2   ??? simvastatin (ZOCOR) 40 MG tablet TAKE 1 TABLET AT BEDTIME 90 tablet 3   ??? lisinopril (PRINIVIL;ZESTRIL) 10 MG tablet Take 1 tablet by mouth daily 90 tablet 3   ??? insulin regular (HUMULIN R) 100 UNIT/ML injection Every evening with largest meal (Patient taking differently: Inject into the skin Daily with supper Indications: if BS >300, 5 units of insulin )  3 vial 2   ??? ascorbic acid (VITAMIN C) 500 MG tablet Take 500 mg by mouth daily.     ??? Omega-3 Fatty Acids (OMEGA-3 FISH OIL PO) Take 1,000 mg by mouth daily      ??? Lactobacillus CHEW Take 1 tablet by mouth 3 times daily.     ??? Cyanocobalamin ([employer] B-12 PO) Take 2 tablets by mouth daily      ??? Insulin Syringe-Needle U-100 (B-D INS SYR ULTRAFINE 1CC/30G) 30G X 1/2" 1 ML MISC Inject 1 each into the skin 2 times daily 100 each 5                      General health:  Fairly good.  No fever or chills.                 Skin:  No itching, redness or rash.      Head, eyes, ears, nose, throat:  No headache, epistaxis, rhinorrhea  or sore throat.  Vision and hearing poor, sinus congestion, missing  teeth    Neck:  No pain, stiffness or masses.     Cardiovascular/Respiratory system:  No chest pain, palpitation, shortness of breath, coughing or expectoration.               Gastrointestinal tract: No abdominal pain, nausea, or constipation.   Vomiting, diarrhea, diverticulitis,    Genitourinary:  No burning on micturition.  No hesitancy, urgency, frequency or discoloration of urine.     Locomotor:  No bone or joint pains. No swelling or deformities.  [place] one year ago, muscle problems, right hip and knee.    Neuropsychiatric:  No referable complaints.   depression.    See HPI.     GENERAL PHYSICAL EXAM:         Vitals: BP 145/65 mmHg   Pulse 92   Temp(Src) 98.5 ??F (36.9 ??C) (Oral)   Resp 16   Ht 5' 5" (1.651 m)   Wt 146 lb 6.2 oz (66.4 kg)   BMI 24.36 kg/m2   SpO2 92% Body mass index is 24.36 kg/(m^2).     GENERAL APPEARANCE:  Kristen Gill is 81 y.o.,Caucasian  female, not obese, nourished, conscious, alert.  Does not appear to be distress or pain at this time.          SKIN:  Warm, dry, no cyanosis or jaundice.    HEAD:  Normocephalic, atraumatic, no swelling or tenderness.     EYES:  Pupils equal, reactive to light, Conjunctiva is clear, EOMs intact bil. eyelids WNL.    EARS:  No discharge, no marked hearing loss.    NOSE:  No rhinorrhea, epistaxis or septal deformity.    THROAT:  Not congested. No ulceration bleeding or discharge.     NECK:  No stiffness, trachea central.  No palpable masses or L.N.      CHEST:  Symmetrical and equal on expansion.      HEART:  Regular rate and rhythm. S1 > S2, No audible murmurs or gallops.     LUNGS:  Equal on expansion, normal breath sounds.  No adventitious sounds.      ABDOMEN:    Soft on palpation.  No localized tenderness.  No guarding or rigidity. No palpable organomegaly.    LYMPHATICS:  No palpable Lymphadenopathy.      LOCOMOTOR, BACK AND SPINE:  No tenderness or deformities.     EXTREMITIES:  Symmetrical, no pedal edema.  Homan???s sign negative.  No discoloration  or ulcerations.    NEUROLOGIC:  The patient is conscious, alert, oriented.  No apparent focal sensory deficits. No motor deficits, muscle strength equal Bil. No facial droop, tongue protrudes centrally, no slurring of the speech.     PROVISIONAL DIAGNOSES:      Dysphagia,     Past Medical History   Diagnosis Date   ??? IDDM (insulin dependent diabetes mellitus) ([place])    ??? Hyperlipidemia    ??? Hypertension    ??? Osteoarthritis    ??? Depression    ??? Hypothyroidism    ??? Esophageal dilatation      Salah Nakamura J Maribell Demeo, NP on 06/02/2014 at 12:32 PM

## 2014-06-02 NOTE — Progress Notes (Signed)
Physical Therapy  DATE: 06/02/2014    NAME: Kristen Gill  MRN: [id number]   DOB: 02/18/1928    Patient not seen this date for Physical Therapy due to:  []  Blood transfusion in progress  []  Hemodialysis  [x]   Refusal by Patient: Patient states she does not need therapy she is only here to have her esophagus treated then she will be leaving   []  Spine Precautions   []  Strict Bedrest  []  Surgery  []  Testing      []  Other        []  PT being discontinued at this time. Patient independent. No further needs.   []  PT being discontinued at this time as the patient has been transferred to palliative care. No further needs.    Oda CoganStephanie L Keymarion Bearman, PT

## 2014-06-02 NOTE — H&P (Signed)
INTERMED H+P   History and Physical    Date: 06/02/2014  Name: Kristen Gill  MRN: [id number]  Admit Date: 06/01/2014  Primary Care:  Michelle Piper, CNP  I performed an H+P and document an H+P on this pt today    06-02-14  Chief Complaint: i am here due to difficulty swallowing    History of Present Illness: Kristen Gill is a 81 y.o., female who presents with dysphagia  Location:  Severity:moderate severe  Duration:days  Timing:  Context:outpatient  Modifiers:none   Associations:    Past Medical History  Past Medical History   Diagnosis Date   ??? IDDM (insulin dependent diabetes mellitus) ([facility])    ??? Hyperlipidemia    ??? Hypertension    ??? Osteoarthritis    ??? Depression    ??? Hypothyroidism    ??? Esophageal dilatation    ??? Oropharyngeal dysphagia 06/02/2014   ??? Ulcer of esophagus without bleeding 06/02/2014       Past Surgical History   has past surgical history that includes Hysterectomy; Cholecystectomy; Thyroidectomy; Colonoscopy (04/14/2008); Colonoscopy (06/03/2008); Upper gastrointestinal endoscopy (2010); Upper gastrointestinal endoscopy (10/14/2008); Upper gastrointestinal endoscopy (09/12/2011); and Upper gastrointestinal endoscopy (06/02/14).    Social History:    reports that she has never smoked. She has never used smokeless tobacco.   reports that she does not drink alcohol.    Family History:  family history includes Birth Defects in her brother; Cancer in her brother and sister; Heart Disease in her father; Other in her mother.    Allergies:  Allergies   Allergen Reactions   ??? Pravastatin      Pt states read about and refuses to take        Medications:  Prescriptions prior to admission: Cholecalciferol (VITAMIN D) 2000 UNITS CAPS capsule, Take 2,000 Units by mouth daily  calcium & magnesium carbonates (MYLANTA) 311-232 MG per tablet, Take 1 tablet by mouth daily  Lutein 20 MG TABS, Take 20 mg by mouth daily  aspirin 325 MG tablet, Take 325 mg by mouth daily  omeprazole (PRILOSEC) 20 MG capsule, Take 1  capsule by mouth 2 times daily  ondansetron (ZOFRAN-ODT) 4 MG disintegrating tablet, Take 1 tablet by mouth every 8 hours as needed for Nausea or Vomiting  ibuprofen (ADVIL;MOTRIN) 800 MG tablet, Take 1 tablet by mouth every 6 hours as needed for Pain  amitriptyline (ELAVIL) 75 MG tablet, Take 1 tablet by mouth 2 times daily  HUMULIN N 100 UNIT/ML injection vial, INJECT 100 UNITS UNDER THE SKIN TWICE A DAY BEFORE MEALS (Patient taking differently: 30 units BID)  glucagon 1 MG injection, Inject 1 mg into the skin See Admin Instructions Use for symptomatic hypoglycemia only when patient is unable to take oral food or glucose tabs  levothyroxine (SYNTHROID) 150 MCG tablet, TAKE 1 TABLET EVERY MORNING  simvastatin (ZOCOR) 40 MG tablet, TAKE 1 TABLET AT BEDTIME  lisinopril (PRINIVIL;ZESTRIL) 10 MG tablet, Take 1 tablet by mouth daily  insulin regular (HUMULIN R) 100 UNIT/ML injection, Every evening with largest meal (Patient taking differently: Inject into the skin Daily with supper Indications: if BS >300, 5 units of insulin )  ascorbic acid (VITAMIN C) 500 MG tablet, Take 500 mg by mouth daily.  Omega-3 Fatty Acids (OMEGA-3 FISH OIL PO), Take 1,000 mg by mouth daily   Lactobacillus CHEW, Take 1 tablet by mouth 3 times daily.  Cyanocobalamin ([employer]S B-12 PO), Take 2 tablets by mouth daily   Insulin Syringe-Needle U-100 (B-D INS  SYR ULTRAFINE 1CC/30G) 30G X 1/2" 1 ML MISC, Inject 1 each into the skin 2 times daily    ROS:   see below    General: Denies any fevers, chills,   HEENT: epistaxis (-) or tinnitus (-),   Eyes: denies eye pain or double vision  Heart: Denies any chest pain, , palpitations  Lungs: Denies SOB, cough, wheezing   Abdomen: Denies abdominal pain, nausea, vomiting, (-)  Diarrhea   ++difficulty swallowing/ felt items getting lodged  Skin: Denies any rash/skin changes or pruritus  GU: denies dyruria (-)/ bladder dysfunction (-)  HEME/ONC: denies enlarged lymph nodes                         Denies easy  bruising  PSYCH: denies anxiety/ depression/panic  ALLERGY/IMMUNO: denies new allergies to food/meds                                     Denies immune deficiency syndromes  MUSCULO/SKELETAL: denies new arthralgias                                            Denies joint effusion        EXAM:  BP 156/61 mmHg   Pulse 91   Temp(Src) 97.7 ??F (36.5 ??C) (Oral)   Resp 16   Ht 5\' 5"  (1.651 m)   Wt 146 lb 6.2 oz (66.4 kg)   BMI 24.36 kg/m2   SpO2 100%   General appearance - alert, well appearing, and in no distress and elderly  Mental status - normal mood, behavior, speech, dress, motor activity, and thought processes  Eyes - pupils equal and reactive, extraocular eye movements intact  Ears - right ear normal, left ear normal  Nose - normal and patent, no erythema, discharge or polyps  Mouth - mucous membranes moist, pharynx normal without lesions  Neck - supple, no significant adenopathy  Lymphatics - no palpable lymphadenopathy  Chest - clear to auscultation, no wheezes, rales or rhonchi, symmetric air entry  Heart - normal rate, regular rhythm, normal S1, S2, no murmurs, rubs, clicks or gallops  Abdomen - soft, nontender, nondistended, no masses or organomegaly  Breasts - not examined  Back exam - kyphosis    Neurological - alert, oriented, normal speech, no focal findings or movement disorder noted  Musculoskeletal - no joint tenderness, deformity or swelling  Extremities - peripheral pulses normal, no pedal edema, no clubbing or cyanosis  Skin - normal coloration and turgor, no rashes, no suspicious skin lesions noted    LABS:   Recent Results (from the past 24 hour(s))   Basic metabolic panel    Collection Time: 06/02/14  5:30 AM   Result Value Ref Range    Glucose 130 (H) 70 - 99 mg/dL    BUN 24 (H) 8 - 23 mg/dL    CREATININE 9.140.96 (H) 0.50 - 0.90 mg/dL    Bun/Cre Ratio NOT REPORTED 9 - 20    Calcium 8.6 8.6 - 10.4 mg/dL    Sodium 782143 956135 - 213144 mmol/L    Potassium 3.6 (L) 3.7 - 5.3 mmol/L    Chloride 103 98 - 107 mmol/L     CO2 24 20 - 31 mmol/L    Anion Gap 16  9 - 17 mmol/L    GFR Non-African American 56 (L) >60 mL/min    GFR African American >60 >60 mL/min    GFR Comment          GFR Staging NOT REPORTED    CBC    Collection Time: 06/02/14  5:30 AM   Result Value Ref Range    WBC 11.2 (H) 3.5 - 11.0 k/uL    RBC 4.34 4.0 - 5.2 m/uL    Hemoglobin 12.9 12.0 - 16.0 g/dL    Hematocrit 16.139.6 36 - 46 %    MCV 91.2 80 - 100 fL    MCH 29.8 26 - 34 pg    MCHC 32.7 31 - 37 g/dL    RDW 09.613.2 04.511.5 - 40.914.9 %    Platelets 229 150 - 450 k/uL    MPV 9.8 6.0 - 12.0 fL   POC Glucose Fingerstick    Collection Time: 06/02/14  6:42 AM   Result Value Ref Range    POC Glucose 128 (H) 65 - 105 mg/dL   POC Glucose Fingerstick    Collection Time: 06/02/14 11:20 AM   Result Value Ref Range    POC Glucose 160 (H) 65 - 105 mg/dL   POC Glucose Fingerstick    Collection Time: 06/02/14  4:23 PM   Result Value Ref Range    POC Glucose 183 (H) 65 - 105 mg/dL   POC Glucose Fingerstick    Collection Time: 06/02/14  8:08 PM   Result Value Ref Range    POC Glucose 319 (H) 65 - 105 mg/dL       EKG:    IMAGING:  Imaging studies have been reviewed in the computerized chart.    Assessment:  Primary Problem  Oropharyngeal dysphagia    Active Hospital Problems    Diagnosis Date Noted   ??? Oropharyngeal dysphagia [R13.12] 06/02/2014     Priority: High   ??? Ulcer of esophagus without bleeding [K22.10] 06/02/2014   ??? Abdominal pain, epigastric [R10.13] 10/15/2011   ??? Hypertension [I10]    ??? Hyperlipidemia [E78.5]    ??? Hypothyroidism [E03.9]    ??? Osteoarthritis [M19.90]    ??? Depression [F32.9]    ??? IDDM (insulin dependent diabetes mellitus) ([facility]) [E11.9, Z79.4]    dm2 controlled-based on readings  htn controlled-based on readings   hld controlled-based on symptoms  Hypothyroid controlled -based on symptoms           Plan:         admit        The severity of this patient's signs and symptoms (dysphagia/ esophag ulcers ) indicate the need for an inpatient admission.      Insulin for  dm2   hold offf on ASA and other AC due to ulcer and potential fro bleeding complications    Stop motrin   continue meds for htn and hld   continue meds for hypothyroid condition   ppi    See orders  Electronically signed by Marry GuanUGLAS Dannis Deroche, DO on 06/02/2014 at 10:30 PM    Copy of H+P to Michelle Piperiane J Kielmeyer, CNP

## 2014-06-02 NOTE — Consults (Signed)
Nutrition Assessment    Type and Reason for Visit: Positive Nutrition Screen, Consult (Difficulty swallowing)    Nutrition Recommendations: Once diet is advanced, add carb control restriction.     Malnutrition Assessment:  ?? Malnutrition Status: At risk for malnutrition  ?? Context: Acute illness or injury  ?? Findings of the 6 clinical characteristics of malnutrition (Minimum of 2 out of 6 clinical characteristics is required to make the diagnosis of moderate or severe Protein Calorie Malnutrition based on AND/ASPEN Guidelines):  1. Energy Intake-Less than or equal to 50%, greater than or equal to 5 days    2. Weight Loss-2% loss or greater,  (2 months)  3. Fat Loss-Unable to assess,    4. Muscle Loss-Unable to assess,    5. Fluid Accumulation-No significant fluid accumulation, Extremities  6. Grip Strength-Not measured    Nutrition Diagnosis:   ?? Problem: Inadequate oral intake  ?? Etiology: related to  (Esophageal stricture)    ??? Signs and symptoms:  as evidenced by Diet history of poor intake, Intake 0-25% (difficulty swallowing, unplanned weight loss)    Nutrition Assessment:  ?? Subjective Assessment: Patient has a medical history for esophageal dialation. Patient reports that she has not been able to eat for the last 6 days because the food does not go down into her stomach and she vomits after eating. Patient needs another esophageal dialation which may be done this afternoon. Patient reports a weight loss of 6lbs in the last 2 months.    ?? Wound Type: None     ?? Current Nutrition Therapies:  ?? Oral Diet Orders: NPO   ?? Oral Diet intake: NPO     ?? Anthropometric Measures:  ?? Ht: 5\' 5"  (165.1 cm)   ?? Current Body Wt: 146 lb (66.225 kg)  ?? Usual Body Wt: 152 lb (68.947 kg)  ?? % Weight Change: 4.1,  2 months  ?? Ideal Body Wt: 125 lb (56.7 kg), % Ideal Body 117  ?? BMI Classification: BMI 18.5 - 24.9 Normal Weight     ?? Comparative Standards (Estimated Nutrition Needs):  ?? Estimated Daily Total Kcal:  1460-1660  ?? Estimated Daily Protein (g): 66-80    Estimated Intake vs Estimated Needs: Intake Less Than Needs    Nutrition Risk Level: High    Nutrition Interventions:   Continue NPO  Continued Inpatient Monitoring    Nutrition Evaluation:   ?? Evaluation: Goals set   ?? Goals: Provide appropriate diet texture for patient's needs. Meet greater than 75% of estimated nutrition needs.     ?? Monitoring: Chewing/Swallowing, NPO Status, Diet Progression, Pertinent Labs    See Adult Nutrition Doc Flowsheet for more detail.     Myrene Buddyanielle Lorie Cleckley  MFN, R.D, L.D,  Clinical Dietitian  Pager # [phone]

## 2014-06-02 NOTE — Consults (Signed)
H&P done.  Alferd ApaPHYLLIS J Kolette Vey, NP on 06/02/2014 at 12:43 PM

## 2014-06-02 NOTE — Op Note (Signed)
ESOPHAGOGASTRODUODENOSCOPY   ( EGD )  Prolonged procedure  DATE OF PROCEDURE: 06/02/2014     SURGEON: Jane CanarySudhakar N Nahsir Venezia, MD    ASSISTANT: None    PREOPERATIVE DIAGNOSIS: complete dysphagia    POSTOPERATIVE DIAGNOSIS: esophagus filled with liquid and food.     OPERATION: Upper GI endoscopy with Biopsy, and Foreign body removal. PROLONGED PROCEDURE, took 1 hour  ,   ANESTHESIA: Moderate Sedation     ESTIMATED BLOOD LOSS: None    COMPLICATIONS: None.     SPECIMENS:  Was Obtained: esophagus    HISTORY: The patient is a 81 y.o. year old female with history of above preop diagnosis.  I recommended esophagogastroduodenoscopy with possible biopsy and I explained the risk, benefits, expected outcome, and alternatives to the procedure.  Risks included but are not limited to bleeding, infection, respiratory distress, hypotension, and perforation of the esophagus, stomach, or duodenum.  Patient understands and is in agreement.    PROCEDURE: The patient was given IV conscious sedation.  The patient's SPO2 remained above 90% throughout the procedure. Cetacaine spray given.  Patient placed in left lateral position.  Olympus GIF 160 videogastroscope was inserted orally under vision into the esophagus without difficulty and advanced into the stomach then through the pylorus up to the second part of duodenum.      Findings:    Retropharyngeal area was grossly normal appearing    Esophagus: abnormal: starting from cervical esophagus , filled with liquid and food. Fluid aspirated and i did use 4 roth nets, rat tooth forceps,  snare to remove the food impaction. I might have withdrawn and reinserted the scope in to the esophagus 15 times. The meat piece was cutting through easily. Finally impaction is cleared. At the site of impaction -(lower esophagus) there is extensive ulceration seen  as the meat was impacted in the last 6 days. No critical stricture noted at this time.It took prolonged time to clear esophagus  . Has hiatal  hernia  ? candida in upper esophagus-biopsies takem  Stomach:    Fundus and Cardia Examined in Retroflexed View: normal    Body: normal    Antrum: normal    Duodenum:     Descending: normal    Bulb: normal    While withdrawing the scope the above findings were verified and the scope was removed.  The patient tolerated the procedure well.     Recommendations/Plan:   1. F/U Biopsies  2. F/U In Office as instructed  3. Discussed with the family  4. Make food in to small pieces  5. Liquid diet to day    Electronically signed by Jane CanarySudhakar N Dema Timmons, MD  on 06/02/2014 at 2:35 PM

## 2014-06-02 NOTE — Consults (Signed)
GI Consult Note:    Name: Kristen Gill  MRN: [id number]     Acct: [id number]  Room: 000111000111    Admit Date: 06/01/2014  PCP: Michelle Piper, CNP    Physician Requesting Consult: Arnold Long, MD     Reason for Consult:  Dysphagia.    Chief Complaint:     Chief Complaint   Patient presents with   ??? Dysphagia       History Obtained From:     patient, family member - patient's daughters, nursing staff, electronic medical record,     History of Present Illness:      Kristen Gill is a Caucasian 81 y.o.  female who presents with Dysphagia      Symptoms:  Kristen Gill, 81 year old patient seen at [facility].  Patient is admitted from the emergency department last night with a history of dysphagia.  I'm asked to see the patient today.    Patient states that she has a dysphagia in the last 6 days.  She is not able to swallow liquids.  Also she is spitting saliva.  Patient has this symptom every day in the last 6 days.  Soon after taking liquid liquids she regurgitates and brings it out.  She also has substernal discomfort.  No abdominal pain.  No shortness of breath.  She does have throat irritation.  She has weight loss.  She also has chronic GERD.  No history of odynophagia.    Patient old records reviewed.  In September 2013 patient had a EGD and esophageal dilatation done.  At that time she was dilated up to 50 [place] Maloney dilator size.  Since then patient was eating well until about 6 days ago.  She was eating solid food without problem.    Past Medical History:     Past Medical History   Diagnosis Date   ??? IDDM (insulin dependent diabetes mellitus) (HCC)    ??? Hyperlipidemia    ??? Hypertension    ??? Osteoarthritis    ??? Depression    ??? Hypothyroidism    ??? Esophageal dilatation    ??? Oropharyngeal dysphagia 06/02/2014   ??? Ulcer of esophagus without bleeding 06/02/2014        Past Surgical History:     Past Surgical History   Procedure Laterality Date   ??? Hysterectomy     ??? Cholecystectomy     ???  Thyroidectomy     ??? Colonoscopy  04/14/2008     severe ischemic colitis   ??? Colonoscopy  06/03/2008     redundant colon, a lot of adhesions, poor prep, diverticula   ??? Upper gastrointestinal endoscopy  2010     dilation   ??? Upper gastrointestinal endoscopy  10/14/2008     esophageal lesion, biopsy shows inflammatory changes   ??? Upper gastrointestinal endoscopy  09/12/2011     small ulcer upper part of stomach, reactive gastropathy   ??? Upper gastrointestinal endoscopy  06/02/14     with removal of retained food and biopsy        Medications Prior to Admission:       Prior to Admission medications    Medication Sig Start Date End Date Taking? Authorizing Provider   Cholecalciferol (VITAMIN D) 2000 UNITS CAPS capsule Take 2,000 Units by mouth daily   Yes Historical Provider, MD   calcium & magnesium carbonates (MYLANTA) 045-409 MG per tablet Take 1 tablet by mouth daily   Yes Historical Provider, MD  Lutein 20 MG TABS Take 20 mg by mouth daily   Yes Historical Provider, MD   aspirin 325 MG tablet Take 325 mg by mouth daily   Yes Historical Provider, MD   omeprazole (PRILOSEC) 20 MG capsule Take 1 capsule by mouth 2 times daily 05/31/14  Yes Diane Ewell PoeJ Kielmeyer, CNP   ondansetron (ZOFRAN-ODT) 4 MG disintegrating tablet Take 1 tablet by mouth every 8 hours as needed for Nausea or Vomiting 05/31/14  Yes Diane Ewell PoeJ Kielmeyer, CNP   ibuprofen (ADVIL;MOTRIN) 800 MG tablet Take 1 tablet by mouth every 6 hours as needed for Pain 04/05/14  Yes Diane Ewell PoeJ Kielmeyer, CNP   amitriptyline (ELAVIL) 75 MG tablet Take 1 tablet by mouth 2 times daily 04/01/14  Yes Diane Ewell PoeJ Kielmeyer, CNP   HUMULIN N 100 UNIT/ML injection vial INJECT 100 UNITS UNDER THE SKIN TWICE A DAY BEFORE MEALS  Patient taking differently: 30 units BID 03/26/14  Yes Marjory SneddonKelly Kramer Finney, CNP   glucagon 1 MG injection Inject 1 mg into the skin See Admin Instructions Use for symptomatic hypoglycemia only when patient is unable to take oral food or glucose tabs 12/01/13  Yes Diane Ewell PoeJ  Kielmeyer, CNP   levothyroxine (SYNTHROID) 150 MCG tablet TAKE 1 TABLET EVERY MORNING 10/26/13  Yes Marjory SneddonKelly Kramer Finney, CNP   simvastatin (ZOCOR) 40 MG tablet TAKE 1 TABLET AT BEDTIME 10/26/13  Yes Marjory SneddonKelly Kramer Finney, CNP   lisinopril (PRINIVIL;ZESTRIL) 10 MG tablet Take 1 tablet by mouth daily 10/26/13  Yes Marjory SneddonKelly Kramer Finney, CNP   insulin regular (HUMULIN R) 100 UNIT/ML injection Every evening with largest meal  Patient taking differently: Inject into the skin Daily with supper Indications: if BS >300, 5 units of insulin  08/01/12  Yes Marjory SneddonKelly Kramer Finney, CNP   ascorbic acid (VITAMIN C) 500 MG tablet Take 500 mg by mouth daily.   Yes Historical Provider, MD   Omega-3 Fatty Acids (OMEGA-3 FISH OIL PO) Take 1,000 mg by mouth daily    Yes Historical Provider, MD   Lactobacillus CHEW Take 1 tablet by mouth 3 times daily.   Yes Historical Provider, MD   Cyanocobalamin ([employer] B-12 PO) Take 2 tablets by mouth daily    Yes Historical Provider, MD   Insulin Syringe-Needle U-100 (B-D INS SYR ULTRAFINE 1CC/30G) 30G X 1/2" 1 ML MISC Inject 1 each into the skin 2 times daily 12/14/13   Marjory SneddonKelly Kramer Finney, CNP        Allergies:       Pravastatin    Social History:     Tobacco:    reports that she has never smoked. She has never used smokeless tobacco.  Alcohol:      reports that she does not drink alcohol.  Drug Use:  reports that she does not use illicit drugs.    Family History:     Family History   Problem Relation Age of Onset   ??? Other Mother      died at age 61101 of old age   ??? Heart Disease Father    ??? Cancer Sister      lung   ??? Cancer Brother    ??? Birth Defects Brother      unknown site       Review of Systems:     Review of Systems   Constitutional: Positive for weight loss. Negative for fever.   HENT: Negative for nosebleeds and sore throat.    Eyes: Negative for blurred vision and discharge.  Respiratory: Negative for cough, shortness of breath and wheezing.    Cardiovascular: Negative for chest pain,  palpitations and leg swelling.   Gastrointestinal: Positive for heartburn and nausea. Negative for abdominal pain, diarrhea, constipation, blood in stool and melena.   Genitourinary: Negative for urgency, hematuria and flank pain.   Musculoskeletal: Negative for myalgias, back pain, joint pain and neck pain.   Skin: Negative for itching and rash.   Neurological: Positive for weakness. Negative for dizziness, tremors, focal weakness, seizures, loss of consciousness and headaches.   Endo/Heme/Allergies: Negative for polydipsia. Does not bruise/bleed easily.   Psychiatric/Behavioral: The patient is nervous/anxious. The patient does not have insomnia.        Code Status:  Full Code    Physical Exam:     Vitals:  BP 156/61 mmHg   Pulse 91   Temp(Src) 97.7 ??F (36.5 ??C) (Oral)   Resp 16   Ht  (1.651 m)   Wt 146 lb 6.2 oz (66.4 kg)   BMI 24.36 kg/m2   SpO2 100%  Temp (24hrs), Avg:98.2 ??F (36.8 ??C), Min:97.7 ??F (36.5 ??C), Max:98.7 ??F (37.1 ??C)      Physical Exam   Constitutional: She is oriented to person, place, and time. She appears well-developed and well-nourished.   Patient is comfortable at rest.   HENT:   Head: Normocephalic and atraumatic.   No oral lesions   Eyes: Conjunctivae are normal. Pupils are equal, round, and reactive to light. No scleral icterus.   Neck: Normal range of motion. Neck supple. No hepatojugular reflux and no JVD present. No tracheal deviation present. No thyromegaly present.   Cardiovascular: Normal rate, regular rhythm, normal heart sounds and intact distal pulses.    Pulmonary/Chest: Effort normal and breath sounds normal. No respiratory distress. She has no wheezes. She has no rales.   Abdominal: Soft. Bowel sounds are normal. She exhibits no distension, no ascites and no mass. There is no hepatomegaly. There is no tenderness. There is no rebound. No hernia.   Musculoskeletal: She exhibits no edema or tenderness.   No joint swelling   Lymphadenopathy:     She has no cervical adenopathy.    Neurological: She is alert and oriented to person, place, and time. No cranial nerve deficit.   Skin: Skin is warm. No bruising, no ecchymosis and no rash noted. No erythema.   Psychiatric: Thought content normal.   Nursing note and vitals reviewed.    Data:   CBC:   Lab Results   Component Value Date    WBC 11.2 06/02/2014    RBC 4.34 06/02/2014    HGB 12.9 06/02/2014    HCT 39.6 06/02/2014    MCV 91.2 06/02/2014    MCH 29.8 06/02/2014    MCHC 32.7 06/02/2014    RDW 13.2 06/02/2014    PLT 229 06/02/2014    MPV 9.8 06/02/2014     CBC with Differential:    Lab Results   Component Value Date    WBC 11.2 06/02/2014    RBC 4.34 06/02/2014    HGB 12.9 06/02/2014    HCT 39.6 06/02/2014    PLT 229 06/02/2014    MCV 91.2 06/02/2014    MCH 29.8 06/02/2014    MCHC 32.7 06/02/2014    RDW 13.2 06/02/2014    LYMPHOPCT 21 06/01/2014    MONOPCT 8 06/01/2014    BASOPCT 1 06/01/2014    MONOSABS 0.90 06/01/2014    LYMPHSABS 2.30 06/01/2014    EOSABS 0.10  06/01/2014    BASOSABS 0.10 06/01/2014    DIFFTYPE NOT REPORTED 06/01/2014     Hemoglobin/Hematocrit:    Lab Results   Component Value Date    HGB 12.9 06/02/2014    HCT 39.6 06/02/2014     CMP:    Lab Results   Component Value Date    NA 143 06/02/2014    K 3.6 06/02/2014    CL 103 06/02/2014    CO2 24 06/02/2014    BUN 24 06/02/2014    CREATININE 0.96 06/02/2014    GFRAA >60 06/02/2014    LABGLOM 56 06/02/2014    GLUCOSE 130 06/02/2014    PROT 8.0 06/01/2014    LABALBU 4.5 06/01/2014    CALCIUM 8.6 06/02/2014    BILITOT 0.47 06/01/2014    ALKPHOS 71 06/01/2014    AST 22 06/01/2014    ALT 18 06/01/2014     BMP:    Lab Results   Component Value Date    NA 143 06/02/2014    K 3.6 06/02/2014    CL 103 06/02/2014    CO2 24 06/02/2014    BUN 24 06/02/2014    LABALBU 4.5 06/01/2014    CREATININE 0.96 06/02/2014    CALCIUM 8.6 06/02/2014    GFRAA >60 06/02/2014    LABGLOM 56 06/02/2014    GLUCOSE 130 06/02/2014     PT/INR:  No results found for: PROTIME, INR  PTT:  No results found for:  APTT, PTT[APTT}    Assesment:     Primary Problem  Oropharyngeal dysphagia    Active Hospital Problems    Diagnosis Date Noted   ??? Oropharyngeal dysphagia [R13.12] 06/02/2014     Priority: High   ??? Ulcer of esophagus without bleeding [K22.10] 06/02/2014   ??? Abdominal pain, epigastric [R10.13] 10/15/2011   ??? Hypertension [I10]    ??? Hyperlipidemia [E78.5]    ??? Hypothyroidism [E03.9]    ??? Osteoarthritis [M19.90]    ??? Depression [F32.9]    ??? IDDM (insulin dependent diabetes mellitus) (HCC) [E11.9, Z79.4]     basing on the history, symptoms this patient has complete dysphagia.    Need to rule out esophageal stricture, malignancy, foreign body of the esophagus.    Plan:     1. Discussed with the patient, patient's daughters were present.  2. She needs EGD in the next couple of hours.  3. I did discuss with them regarding EGD procedure, risks and benefits.  4. Basing on the results we'll decide further management.        Thank you for allowing me to participate in the care of your patient.  Please feel free to contact me with any questions or concerns.     Electronically signed by Jane CanarySudhakar N Riot Barrick, MD on 06/02/2014 at 9:53 PM     Copy sent to Dr. Michelle Piperiane J Kielmeyer, CNP

## 2014-06-02 NOTE — Care Coordination-Inpatient (Signed)
CASE MANAGEMENT NOTE:    Admission Date: 06/01/2014        Kristen Gill is a 81 y.o.  female     Admitted for DYSPHAGIA    Subjective/Current Data:  5/18:  MEDICARE/BCBS.  From home with children.  DME:  [employer] (does not use this).  Declined VNS.  Denies needs.    Pharmacy:  [facility] [place]    PCP:  Corky Soxiane Kielmeyer    Insurance:  Medicare/BCBS    READMISSION RISK:  Readmission Risk            Readmission Risk:       10.75       Age 81 or Greater: 1    Admitted from SNF or Requires Paid or Family Care: 0    Currently has CHF,COPD,ARF,CRI,or is on dialysis: 0    Takes more than 5 Prescription Medications: 4    Takes Digoxin,Insulin,Anticoagulants,Narcotics or ASA/Plavix: 2    Hospital Admit in Past 12 Months: 0    On Disability: 0    Patient Considers own Health: 3.75          Plan:  Home without needs.    Electronically signed by Kerry HoughAmanda L Usha Slager, RN on 06/02/14 at 11:17 AM

## 2014-06-03 LAB — EKG 12-LEAD
Atrial Rate: 73 {beats}/min
P Axis: 68 degrees
P-R Interval: 158 ms
Q-T Interval: 428 ms
QRS Duration: 114 ms
QTc Calculation (Bazett): 471 ms
R Axis: -20 degrees
T Axis: 73 degrees
Ventricular Rate: 73 {beats}/min

## 2014-06-03 LAB — CBC
Hematocrit: 34.5 % — ABNORMAL LOW (ref 36–46)
Hemoglobin: 11.5 g/dL — ABNORMAL LOW (ref 12.0–16.0)
MCH: 30.3 pg (ref 26–34)
MCHC: 33.4 g/dL (ref 31–37)
MCV: 90.7 fL (ref 80–100)
MPV: 9.6 fL (ref 6.0–12.0)
Platelets: 183 10*3/uL (ref 150–450)
RBC: 3.8 m/uL — ABNORMAL LOW (ref 4.0–5.2)
RDW: 13.1 % (ref 11.5–14.9)
WBC: 9.5 10*3/uL (ref 3.5–11.0)

## 2014-06-03 LAB — POC GLUCOSE FINGERSTICK
POC Glucose: 257 mg/dL — ABNORMAL HIGH (ref 65–105)
POC Glucose: 319 mg/dL — ABNORMAL HIGH (ref 65–105)
POC Glucose: 342 mg/dL — ABNORMAL HIGH (ref 65–105)

## 2014-06-03 MED ORDER — INSULIN LISPRO 100 UNIT/ML SC SOLN
100 UNIT/ML | Freq: Three times a day (TID) | SUBCUTANEOUS | Status: DC
Start: 2014-06-03 — End: 2014-06-03
  Administered 2014-06-03: 12:00:00 6 [IU] via SUBCUTANEOUS
  Administered 2014-06-03: 16:00:00 8 [IU] via SUBCUTANEOUS

## 2014-06-03 MED ORDER — DEXTROSE 5 % IV SOLN
5 % | INTRAVENOUS | Status: DC | PRN
Start: 2014-06-03 — End: 2014-06-03

## 2014-06-03 MED ORDER — DEXTROSE 50 % IV SOLN
50 % | INTRAVENOUS | Status: DC | PRN
Start: 2014-06-03 — End: 2014-06-03

## 2014-06-03 MED ORDER — ACETAMINOPHEN 160 MG/5ML PO SOLN
160 MG/5ML | ORAL | Status: AC | PRN
Start: 2014-06-03 — End: ?

## 2014-06-03 MED ORDER — INSULIN LISPRO 100 UNIT/ML SC SOLN
100 UNIT/ML | Freq: Every evening | SUBCUTANEOUS | Status: DC
Start: 2014-06-03 — End: 2014-06-03
  Administered 2014-06-03: 01:00:00 4 [IU] via SUBCUTANEOUS

## 2014-06-03 MED ORDER — GLUCAGON HCL RDNA (DIAGNOSTIC) 1 MG IJ SOLR
1 MG | INTRAMUSCULAR | Status: DC | PRN
Start: 2014-06-03 — End: 2014-06-03

## 2014-06-03 MED ORDER — GLUCOSE 40 % PO GEL
40 % | ORAL | Status: DC | PRN
Start: 2014-06-03 — End: 2014-06-03

## 2014-06-03 MED FILL — AMITRIPTYLINE HCL 75 MG PO TABS: 75 MG | ORAL | Qty: 1

## 2014-06-03 MED FILL — LISINOPRIL 10 MG PO TABS: 10 MG | ORAL | Qty: 1

## 2014-06-03 MED FILL — SODIUM CHLORIDE 0.9 % IJ SOLN: 0.9 % | INTRAMUSCULAR | Qty: 10

## 2014-06-03 MED FILL — SYNTHROID 150 MCG PO TABS: 150 MCG | ORAL | Qty: 1

## 2014-06-03 MED FILL — HUMALOG 100 UNIT/ML SC SOLN: 100 UNIT/ML | SUBCUTANEOUS | Qty: 3

## 2014-06-03 MED FILL — ACETAMINOPHEN 160 MG/5ML PO SOLN: 160 MG/5ML | ORAL | Qty: 20.3

## 2014-06-03 MED FILL — PROTONIX 40 MG IV SOLR: 40 MG | INTRAVENOUS | Qty: 40

## 2014-06-03 NOTE — Care Coordination-Inpatient (Signed)
Follow up appointment made for patient with Kristen Gill  on 06/08/14 at 1pm.  Notified patient of date and time.  Patient verbalizes understanding.  Appointment entered into discharge navigator.    Electronically signed by Kerry HoughAmanda L Nikala Walsworth, RN on 06/03/2014 at 11:04 AM

## 2014-06-03 NOTE — Progress Notes (Signed)
Reviewed discharge and follow instructions with patient and family. Both deny questions.

## 2014-06-03 NOTE — Discharge Instructions (Addendum)
Good nutrition is important when healing from an illness, injury, or surgery. Follow any nutrition recommendations given to you during the hospital stay. If you are instructed to take an oral nutrition supplement at home, you can take it with meals, in-between meals, and/or before bedtime. These supplements can be purchased at most local grocery stores, pharmacies, and chain super-stores. If you need help in covering the cost of your medication or oral supplement, visit www.RxAssist.org. If you have any questions about your diet or nutrition, call the hospital and ask for the dietitian.       Your nutrition orders at the time of discharge were:  DIET FULL LIQUID;.

## 2014-06-03 NOTE — Discharge Instructions (Signed)
As tolerated

## 2014-06-03 NOTE — Care Coordination-Inpatient (Signed)
ONGOING DISCHARGE PLAN:    Continues to deny VNS.    Will continue to follow for ongoing discharge needs.    Electronically signed by Kerry HoughAmanda L Irmalee Riemenschneider, RN on 06/03/2014 at 11:27 AM

## 2014-06-03 NOTE — Plan of Care (Signed)
Problem: Falls - Risk of  Goal: Absence of falls  Outcome: Met This Shift  Pt remains free of falls. Call light within reach. Bed in lowest position. Nonskid footwear applied. Bedwheels locked. Fall visual posted.    Problem: Infection:  Goal: Will remain free from infection  Will remain free from infection   Outcome: Ongoing  Patient remains afebrile. Will continue to monitor    Problem: Daily Care:  Goal: Daily care needs are met  Daily care needs are met   Outcome: Ongoing  Patient able to verbalize needs    Problem: Pain:  Goal: Patient???s pain/discomfort is manageable  Patient???s pain/discomfort is manageable   Outcome: Ongoing  Adequate pain control achieved this shift. See MAR.

## 2014-06-03 NOTE — Progress Notes (Signed)
Gastroenterology Progress Note    Kristen Gill is a 81 y.o. female patient.  Hospitalization Day:2    SUBJECTIVE:    Patient appears to be comfortable.  She is able to swallow solid food.  No heartburns.  No substernal discomfort.    Physical    VITALS:  BP 144/59 mmHg   Pulse 76   Temp(Src) 98 ??F (36.7 ??C) (Oral)   Resp 16   Ht 5\' 5"  (1.651 m)   Wt 146 lb 6.2 oz (66.4 kg)   BMI 24.36 kg/m2   SpO2 92%  TEMPERATURE:  Current - Temp: 98 ??F (36.7 ??C); Max - Temp  Avg: 98 ??F (36.7 ??C)  Min: 98 ??F (36.7 ??C)  Max: 98 ??F (36.7 ??C)    General:  Alert and oriented,  No apparent distress  Skin- without jaundice  Eyes: anicteric sclera  Cardiac: RRR, Nl s1s2, without murmurs  Lungs CTA Bilaterally, normal effort  Abdomen soft, ND, NT, no HSM, Bowel sounds normal  Ext: without edema  Neuro: no asterixis     Data    Data Review:    Recent Labs      06/01/14   1850  06/02/14   0530  06/03/14   0647   WBC  11.0  11.2*  9.5   HGB  13.8  12.9  11.5*   HCT  41.8  39.6  34.5*   MCV  89.9  91.2  90.7   PLT  230  229  183     Recent Labs      06/01/14   1850  06/02/14   0530   NA  140  143   K  3.9  3.6*   CL  98  103   CO2  28  24   BUN  31*  24*   CREATININE  1.14*  0.96*     Recent Labs      06/01/14   1850   AST  22   ALT  18   BILITOT  0.47   ALKPHOS  71     Recent Labs      06/01/14   1850   LIPASE  12*     No results for input(s): PROTIME, INR in the last 72 hours.  No results for input(s): PTT in the last 72 hours.    Radiology Review:        ASSESSMENT:  Principal Problem:    Oropharyngeal dysphagia  Active Problems:    IDDM (insulin dependent diabetes mellitus) (HCC)    Depression    Hyperlipidemia    Hypertension    Osteoarthritis    Hypothyroidism    Abdominal pain, epigastric    Ulcer of esophagus without bleeding    Dysphagia    Diabetes type 2, controlled (HCC)    Food impaction of esophagus    Esophagitis        PLAN :  1. Patient is advised to make food into small pieces, chew well and swallow.  2. To continue PPI  therapy for 6-8 weeks.  3. To see me in the office in the next 3-4 weeks.  4. Discussed regarding further workup of esophageal pathology.  5. Patient understood agreed.    Thank you for allowing me to participate in the care of your patient.  Please feel free to contact me with any concerns. [phone]    Jane CanarySudhakar N Shardai Star, MD

## 2014-06-04 LAB — SURGICAL PATHOLOGY

## 2014-06-04 NOTE — Telephone Encounter (Signed)
Writer called patients phone # and said it was an invalid #, this was to be a post hospital follow up call. Patient has an appointment on May 24 with Diane, CNP for the follow up visit.

## 2014-06-07 NOTE — Other (Addendum)
Patient Acct Nbr:  [id number]  Primary AUTH/CERT:    Primary Insurance Company Name:     Editor, commissioningrimary Insurance Plan Name:    Primary Insurance Group Number:    Primary [facility] Plan [facility]: [facility]  Primary Insurance Policy Number:  [id number]

## 2014-06-08 ENCOUNTER — Ambulatory Visit: Admit: 2014-06-08 | Discharge: 2014-06-08 | Payer: MEDICARE | Attending: Family | Primary: Family

## 2014-06-08 DIAGNOSIS — R1312 Dysphagia, oropharyngeal phase: Secondary | ICD-10-CM

## 2014-06-08 NOTE — Progress Notes (Signed)
[facility]  [address]  [address] [address]  Dept: [phone]  Dept Fax: [phone]    Kristen Gill is a 81 y.o. female who presents today for her medical conditions/complaints as noted below.  Kristen Gill is c/o of   Chief Complaint   Patient presents with   ??? Follow-Up from [facility] follow up dysphagia     Have you changed or stopped any medications since your last visit including any over-the-counter medicines, vitamins, or herbal medicines? no     Are you taking all your prescribed medications? Yes          If NO, why? -  N/A    Have you seen any other physician or provider since your last visit yes - [facility]    Have you had any other diagnostic tests since your last visit? yes - in [place]    Tobacco use:  Patient  reports that she has never smoked. She has never used smokeless tobacco.   If a smoker, cessation materials provided? NA   1-800-QUIT-NOW [phone])     Medical history Review  Past Medical, Family, and Social History reviewed and does contribute to the patient presenting condition    Health Maintenance Due   Topic Date Due   ??? ZOSTAVAX VACCINE  09/16/1990   ??? PNEUMO VAC >= 65 (2 of 2 - PCV13) 01/31/2013         HPI:     HPI Comments: Here today for follow up from hospital stay  Found large amount of food retained in esophagus along with some erosion  Is on liquids only diet for a couple of weeks, tolerating well, no trouble swallowing liquids. Did have difficulty with pasta so stopped eating it.  Has follow up appt with GI June 1  Blood sugars have been fairly stable, 100-180's, did have one elevation of 400  Other  This is a new (difficulty swallowing) problem. The current episode started 1 to 4 weeks ago. The problem occurs constantly. The problem has been gradually improving. Pertinent negatives include no abdominal pain, arthralgias, chest pain, chills, congestion, coughing,  fatigue, fever, headaches, myalgias, nausea, rash, sore throat, vomiting or weakness. The symptoms are aggravated by eating. Treatments tried: EGD. The treatment provided significant relief.       HEMOGLOBIN A1C (%)   Date Value   11/26/2013 6.2*   11/17/2013 6.4   08/21/2012 8.1*             ( goal A1C is < 7)   MICROALB/CRT. RATIO (mcg/mg creat)   Date Value   11/26/2013 13     LDL CHOLESTEROL (mg/dL)   Date Value   11/26/2013 65   01/26/2012 92   07/18/2010 156*       (goal LDL is <100)   AST (U/L)   Date Value   06/01/2014 22     ALT (U/L)   Date Value   06/01/2014 18     BUN (mg/dL)   Date Value   06/02/2014 24*     BP Readings from Last 3 Encounters:   06/08/14 104/48   06/03/14 144/59   05/31/14 144/68          (goal 120/80)    Past Medical History   Diagnosis Date   ??? IDDM (insulin dependent diabetes mellitus) ([place])    ??? Hyperlipidemia    ??? Hypertension    ???  Osteoarthritis    ??? Depression    ??? Hypothyroidism    ??? Esophageal dilatation    ??? Oropharyngeal dysphagia 06/02/2014   ??? Ulcer of esophagus without bleeding 06/02/2014   ??? Ischemic colitis ([place]) 2010      Past Surgical History   Procedure Laterality Date   ??? Hysterectomy     ??? Cholecystectomy     ??? Thyroidectomy     ??? Colonoscopy  04/14/2008     severe ischemic colitis   ??? Colonoscopy  06/03/2008     redundant colon, a lot of adhesions, poor prep, diverticula   ??? Upper gastrointestinal endoscopy  2010     dilation   ??? Upper gastrointestinal endoscopy  10/14/2008     esophageal lesion, biopsy shows inflammatory changes   ??? Upper gastrointestinal endoscopy  09/12/2011     small ulcer upper part of stomach, reactive gastropathy   ??? Upper gastrointestinal endoscopy  06/02/14     with removal of retained food and biopsy       Family History   Problem Relation Age of Onset   ??? Other Mother      died at age 106 of old age   ??? Heart Disease Father    ??? Cancer Sister      lung   ??? Cancer Brother    ??? Birth Defects Brother      unknown site       History   Substance Use  Topics   ??? Smoking status: Never Smoker    ??? Smokeless tobacco: Never Used   ??? Alcohol Use: No      Current Outpatient Prescriptions   Medication Sig Dispense Refill   ??? acetaminophen (TYLENOL) 160 MG/5ML solution Take 20.3 mLs by mouth every 4 hours as needed 473 mL 3   ??? Cholecalciferol (VITAMIN D) 2000 UNITS CAPS capsule Take 2,000 Units by mouth daily     ??? calcium & magnesium carbonates (MYLANTA) 311-232 MG per tablet Take 1 tablet by mouth daily     ??? omeprazole (PRILOSEC) 20 MG capsule Take 1 capsule by mouth 2 times daily 60 capsule 3   ??? ondansetron (ZOFRAN-ODT) 4 MG disintegrating tablet Take 1 tablet by mouth every 8 hours as needed for Nausea or Vomiting 30 tablet 1   ??? amitriptyline (ELAVIL) 75 MG tablet Take 1 tablet by mouth 2 times daily 180 tablet 2   ??? HUMULIN N 100 UNIT/ML injection vial INJECT 100 UNITS UNDER THE SKIN TWICE A DAY BEFORE MEALS (Patient taking differently: 30 units BID) 15 vial 2   ??? Insulin Syringe-Needle U-100 (B-D INS SYR ULTRAFINE 1CC/30G) 30G X 1/2" 1 ML MISC Inject 1 each into the skin 2 times daily 100 each 5   ??? glucagon 1 MG injection Inject 1 mg into the skin See Admin Instructions Use for symptomatic hypoglycemia only when patient is unable to take oral food or glucose tabs 1 kit 3   ??? levothyroxine (SYNTHROID) 150 MCG tablet TAKE 1 TABLET EVERY MORNING 90 tablet 2   ??? simvastatin (ZOCOR) 40 MG tablet TAKE 1 TABLET AT BEDTIME 90 tablet 3   ??? lisinopril (PRINIVIL;ZESTRIL) 10 MG tablet Take 1 tablet by mouth daily 90 tablet 3   ??? insulin regular (HUMULIN R) 100 UNIT/ML injection Every evening with largest meal (Patient taking differently: Inject into the skin Daily with supper Indications: if BS >300, 5 units of insulin ) 3 vial 2   ??? ascorbic acid (VITAMIN C) 500  MG tablet Take 500 mg by mouth daily.     ??? Cyanocobalamin (CVS B-12 PO) Take 2 tablets by mouth daily      ??? Omega-3 Fatty Acids (OMEGA-3 FISH OIL PO) Take 1,000 mg by mouth daily        No current  facility-administered medications for this visit.     Allergies   Allergen Reactions   ??? Pravastatin      Pt states read about and refuses to take        Health Maintenance   Topic Date Due   ??? ZOSTAVAX VACCINE  09/16/1990   ??? PNEUMO VAC >= 65 (2 of 2 - PCV13) 01/31/2013   ??? FLU VACCINE YEARLY (ADULT)  08/16/2014   ??? TETANUS VACCINE ADULT (11 YEARS AND UP)  01/31/2022   ??? DEXA SCAN WOMEN (modify frequency per FRAX score)  Addressed       Subjective:      Review of Systems   Constitutional: Negative for fever, chills, activity change, appetite change, fatigue and unexpected weight change.   HENT: Negative for congestion, ear pain, hearing loss, sinus pressure, sore throat and trouble swallowing.    Eyes: Negative for visual disturbance.   Respiratory: Negative for cough, shortness of breath and wheezing.    Cardiovascular: Negative for chest pain, palpitations and leg swelling.   Gastrointestinal: Negative for nausea, vomiting, abdominal pain, diarrhea, constipation and blood in stool.   Endocrine: Negative for cold intolerance, heat intolerance, polydipsia, polyphagia and polyuria.   Genitourinary: Negative for urgency, frequency, hematuria and difficulty urinating.   Musculoskeletal: Negative for myalgias and arthralgias.   Skin: Negative for rash.   Allergic/Immunologic: Negative for environmental allergies.   Neurological: Negative for dizziness, weakness, light-headedness and headaches.   Psychiatric/Behavioral: Negative for confusion. The patient is not nervous/anxious.        Objective:     Physical Exam   Constitutional: She is oriented to person, place, and time. She appears well-developed and well-nourished.   HENT:   Head: Normocephalic.   Eyes: Conjunctivae and EOM are normal. Pupils are equal, round, and reactive to light. Right eye exhibits no discharge.   Neck: Normal range of motion.   Cardiovascular: Normal rate, regular rhythm, normal heart sounds and intact distal pulses.    No murmur  heard.  Pulmonary/Chest: Effort normal and breath sounds normal. She has no wheezes.   Abdominal: Soft. Bowel sounds are normal. She exhibits no distension.   Musculoskeletal: Normal range of motion.   Neurological: She is alert and oriented to person, place, and time.   Skin: Skin is warm and dry.   Psychiatric: She has a normal mood and affect. Her behavior is normal. Judgment and thought content normal.     BP 104/48 mmHg   Pulse 76   Resp 18   Wt 155 lb 12.8 oz (70.67 kg)    Assessment:      1. Oropharyngeal dysphagia     2. Esophagitis     3. IDDM (insulin dependent diabetes mellitus) ([place])               Plan:      Return in about 3 months (around 09/08/2014) for diabetes check.    Dysphagia, esophagitis- continue liquid diet until follow up with GI, suggestions on foods provided  DM-monitor for hypoglycemia with diet change, report low sugars <70    Patient given educational materials - see patient instructions.  Discussed use, benefit, and side effects of prescribed medications.  All patient questions answered. Pt voiced understanding. Reviewed health maintenance.  Instructed to continue current medications, diet and exercise.  Patient agreed with treatment plan. Follow up as directed.     Electronically signed by Jefferey Pica, CNP on 06/08/2014 at 2:42 PM

## 2014-06-08 NOTE — Patient Instructions (Signed)
Learning About Swallowing Problems  What are swallowing problems?  Certain health problems that affect the nervous system can cause trouble swallowing. These conditions include stroke, ALS (also known as Lou Gehrig's disease), Parkinson's disease, and multiple sclerosis. The muscles and nerves that help move food through the throat and esophagus may not work right. Growths, such as cancer, and other problems with your esophagus can also make it hard to swallow. The esophagus is the tube that leads from your throat to your stomach.  How are swallowing problems diagnosed?  A doctor or speech therapist will examine you to check for swallowing problems. You may get swallowing tests to check how well your throat muscles work. For these tests, you swallow a special liquid that helps the doctor see your throat and esophagus on an X-ray or video screen.  Other tests use a thin, flexible tube called a scope to check for problems with your esophagus. The doctor puts the scope in your mouth and down your throat to look at your esophagus.  What are the symptoms?  Symptoms of swallowing problems may include:  ?? Trouble getting food or liquids to go down on the first try.  ?? Gagging, choking, or coughing when you swallow.  ?? Having food or liquids come back up through your throat, mouth, or nose after you swallow.  ?? Feeling like foods or liquids are stuck in some part of your throat or chest.  ?? Pain when you swallow.  How are swallowing problems treated?  How swallowing problems are treated depends on the cause. The main goals of treatment will be to help you eat and swallow safely and get good nutrition. This is important for your health and quality of life.  You may learn exercises to train your throat muscles to work together so you're able to swallow better. Learning certain ways to put food in your mouth or to position your head while eating may also help.  Your doctor or a speech therapist may recommend changes to your  diet to help make it easier to swallow. You may need to avoid certain foods or liquids. You also may need to change the thickness of foods or liquids in your diet.  To eat and swallow safely, follow any instructions you get from your doctor or therapist. These ideas may help:  ?? Sit upright when eating, drinking, and taking pills.  ?? Take small bites of food. Chew completely and swallow before taking another bite.  ?? Take small sips of liquids. Hold the liquid in your mouth as you prepare to swallow.  ?? If eating makes you tired, eat smaller but more frequent meals.  ?? If you cough or choke, lean forward and keep your chin tipped downward while you cough.   Where can you learn more?   Go to [email or web address] and sign in to your MyChart account. Enter D018 in the Search Health Information box to learn more about ???Learning About Swallowing Problems.???    If you do not have an account, please click on the ???Sign Up Now??? link.     ?? 2006-2015 [employer]. Care instructions adapted under license by [facility]. This care instruction is for use with your licensed healthcare professional. If you have questions about a medical condition or this instruction, always ask your healthcare professional. Healthwise, Incorporated disclaims any warranty or liability for your use of this information.  Content Version: 10.6.465758; Current as of: June 18, 2012

## 2014-06-16 ENCOUNTER — Ambulatory Visit: Admit: 2014-06-16 | Discharge: 2014-06-16 | Payer: MEDICARE | Attending: Gastroenterology | Primary: Family

## 2014-06-16 DIAGNOSIS — R1312 Dysphagia, oropharyngeal phase: Secondary | ICD-10-CM

## 2014-06-16 MED ORDER — OMEPRAZOLE 20 MG PO CPDR
20 MG | ORAL_CAPSULE | Freq: Two times a day (BID) | ORAL | Status: DC
Start: 2014-06-16 — End: 2014-08-20

## 2014-06-16 MED ORDER — FLUCONAZOLE 100 MG PO TABS
100 MG | ORAL_TABLET | Freq: Every day | ORAL | Status: AC
Start: 2014-06-16 — End: 2014-06-26

## 2014-06-16 NOTE — Progress Notes (Signed)
Subjective:      Patient ID: Kristen Gill is a 81 y.o. female.    HPI  Dr. Michelle Piper, CNP our mutual patient Kristen Gill was seen  for   1. Oropharyngeal dysphagia    2. Ischemic colitis (HCC)    3. Ulcer of esophagus without bleeding    4. Esophagitis     patient was seen at [facility] recently and at that time she had dysphagia.  EGD revealed that she had food impaction of the esophagus.  She had esophageal impaction with the food for more than 5 days.  After the impaction was removed, she was found to have extensive ulceration at this site with the food was impacted that is near the gastroesophageal junction.  Following discharge she is doing reasonably well.  However she has been on only liquid diet.  Also she is on PPI therapy.    Denies heartburns.  Able to swallow liquids without problem.  No abdominal pain.  Bowel movements are satisfactory.  No evidence of rectal bleeding diarrhea etc.  Carries a diagnosis of ischemic colitis several years ago.  Past Medical, Family, and Social History reviewed and does contribute to the patient presenting condition. Past medical history of oropharyngeal dysphagia, ischemic colitis, ulcer of esophagus and esophagitis     patient"s PMH/PSH,SH,PSYCH hx, MEDs, ALLERGIES, and ROS was all reviewed and updated ion the appropriate sections      Review of Systems   Constitutional: Negative.    HENT: Negative.    Eyes: Negative.    Respiratory: Negative.    Cardiovascular: Negative.    Gastrointestinal: Negative.    Endocrine: Negative.    Genitourinary: Negative.    Musculoskeletal: Positive for back pain.   Skin: Negative.    Allergic/Immunologic: Negative.    Neurological: Positive for numbness (left leg numbness).   Hematological: Negative.    Psychiatric/Behavioral: Negative.        Objective:   Physical Exam   Constitutional: She is oriented to person, place, and time. She appears well-developed and well-nourished.   HENT:   Head: Normocephalic and  atraumatic.   No oral lesions   Eyes: Conjunctivae are normal. Pupils are equal, round, and reactive to light. No scleral icterus.   Neck: Normal range of motion. Neck supple. No hepatojugular reflux and no JVD present. No tracheal deviation present. No thyromegaly present.   Cardiovascular: Normal rate, regular rhythm, normal heart sounds and intact distal pulses.    Pulmonary/Chest: Effort normal and breath sounds normal. No respiratory distress. She has no wheezes. She has no rales.   Abdominal: Soft. Bowel sounds are normal. She exhibits no distension, no ascites and no mass. There is no hepatomegaly. There is no tenderness. There is no rebound. No hernia.   Musculoskeletal: She exhibits no edema or tenderness.   No joint swelling   Lymphadenopathy:     She has no cervical adenopathy.   Neurological: She is alert and oriented to person, place, and time. No cranial nerve deficit.   Skin: Skin is warm. No bruising, no ecchymosis and no rash noted. No erythema.   Psychiatric: Thought content normal.   Nursing note and vitals reviewed.      Assessment:      1. Oropharyngeal dysphagia     2. Ischemic colitis (HCC)     3. Ulcer of esophagus without bleeding  EGD   4. Esophagitis     5. Non-intractable vomiting with nausea, vomiting of unspecified type  omeprazole (PRILOSEC) 20 MG capsule           Plan:      Patient is advanced to regular diet.  However, advised to make food into tiny pieces, chew well and swallow.  Also advised to drink plenty of liquids with meals.        As there was Candida in the esophagus, advised to take Diflucan for 7-10 days.    We will continue her on PPI therapy.    She needs EGD in the next 6-8 weeks to evaluate esophageal pathology, and healing of ulcer in the esophagus.  After discussion patient, daughter understood and agreed.

## 2014-06-28 NOTE — Discharge Summary (Signed)
[facility] Discharge Summary      Patient ID: Kristen Gill record number:  [id number]      Patient's PCP:  Jefferey Pica, CNP    Admit Date:  06/01/2014     Discharge Date:   06/03/2014     Admitting Physician:  Erick Alley, MD    Discharge Physician:  Reesa Chew, DO     Discharge Diagnoses:same  Painful diffculty swallowing  Principal diagnosis:  Oropharyngeal dysphagia  Active Hospital Problems    Diagnosis Date Noted   ??? Oropharyngeal dysphagia [R13.12] 06/02/2014     Priority: High   ??? Ulcer of esophagus without bleeding [K22.10] 06/02/2014   ??? Dysphagia [R13.10]    ??? Diabetes type 2, controlled ([place]) [E11.9]    ??? Food impaction of esophagus [T18.128A]    ??? Esophagitis [K20.9]    ??? Abdominal pain, epigastric [R10.13] 10/15/2011   ??? Hypertension [I10]    ??? Hyperlipidemia [E78.5]    ??? Hypothyroidism [E03.9]    ??? Osteoarthritis [M19.90]    ??? Depression [F32.9]    ??? IDDM (insulin dependent diabetes mellitus) ([place]) [E11.9, Z79.4]        Prior Medical Hx:  No current facility-administered medications on file prior to encounter.     Current Outpatient Prescriptions on File Prior to Encounter   Medication Sig Dispense Refill   ??? ondansetron (ZOFRAN-ODT) 4 MG disintegrating tablet Take 1 tablet by mouth every 8 hours as needed for Nausea or Vomiting 30 tablet 1   ??? amitriptyline (ELAVIL) 75 MG tablet Take 1 tablet by mouth 2 times daily 180 tablet 2   ??? HUMULIN N 100 UNIT/ML injection vial INJECT 100 UNITS UNDER THE SKIN TWICE A DAY BEFORE MEALS (Patient taking differently: 30 units BID) 15 vial 2   ??? glucagon 1 MG injection Inject 1 mg into the skin See Admin Instructions Use for symptomatic hypoglycemia only when patient is unable to take oral food or glucose tabs 1 kit 3   ??? levothyroxine (SYNTHROID) 150 MCG tablet TAKE 1 TABLET EVERY MORNING 90 tablet 2   ??? simvastatin (ZOCOR) 40 MG tablet TAKE 1 TABLET AT BEDTIME 90 tablet 3   ??? lisinopril (PRINIVIL;ZESTRIL) 10  MG tablet Take 1 tablet by mouth daily 90 tablet 3   ??? insulin regular (HUMULIN R) 100 UNIT/ML injection Every evening with largest meal (Patient taking differently: Inject into the skin Daily with supper Indications: if BS >300, 5 units of insulin ) 3 vial 2   ??? ascorbic acid (VITAMIN C) 500 MG tablet Take 500 mg by mouth daily.     ??? Omega-3 Fatty Acids (OMEGA-3 FISH OIL PO) Take 1,000 mg by mouth daily      ??? Cyanocobalamin (CVS B-12 PO) Take 2 tablets by mouth daily      ??? Insulin Syringe-Needle U-100 (B-D INS SYR ULTRAFINE 1CC/30G) 30G X 1/2" 1 ML MISC Inject 1 each into the skin 2 times daily 100 each 5       Hospital Course:   The patient was admitted with:  Oropharyngeal dysphagia    The initial assessment and plan included:  Adit  eval by GI service  Use of ppi    The patient responded to medical therapy   The patient was discharged in improved condition  DC planning included:  Home  ppi  Follow up wit gi specialist    Significant Diagnostic Studies:       Procedures:  none  Consults:  IP CONSULT TO HOSPITALIST  IP CONSULT TO GI  IP CONSULT TO DIETITIAN  IP CONSULT TO HISTORY AND PHYSICAL       Discharge Medications:     [place], [place]   Home Medication Instructions NGE:[id number]    Printed on:06/28/14 1140   Medication Information                      acetaminophen (TYLENOL) 160 MG/5ML solution  Take 20.3 mLs by mouth every 4 hours as needed             amitriptyline (ELAVIL) 75 MG tablet  Take 1 tablet by mouth 2 times daily             ascorbic acid (VITAMIN C) 500 MG tablet  Take 500 mg by mouth daily.             calcium & magnesium carbonates (MYLANTA) 311-232 MG per tablet  Take 1 tablet by mouth daily             Cholecalciferol (VITAMIN D) 2000 UNITS CAPS capsule  Take 2,000 Units by mouth daily             Cyanocobalamin (CVS B-12 PO)  Take 2 tablets by mouth daily              glucagon 1 MG injection  Inject 1 mg into the skin See Admin Instructions Use for symptomatic hypoglycemia only when  patient is unable to take oral food or glucose tabs             HUMULIN N 100 UNIT/ML injection vial  INJECT 100 UNITS UNDER THE SKIN TWICE A DAY BEFORE MEALS             insulin regular (HUMULIN R) 100 UNIT/ML injection  Every evening with largest meal             Insulin Syringe-Needle U-100 (B-D INS SYR ULTRAFINE 1CC/30G) 30G X 1/2" 1 ML MISC  Inject 1 each into the skin 2 times daily             levothyroxine (SYNTHROID) 150 MCG tablet  TAKE 1 TABLET EVERY MORNING             lisinopril (PRINIVIL;ZESTRIL) 10 MG tablet  Take 1 tablet by mouth daily             Omega-3 Fatty Acids (OMEGA-3 FISH OIL PO)  Take 1,000 mg by mouth daily              ondansetron (ZOFRAN-ODT) 4 MG disintegrating tablet  Take 1 tablet by mouth every 8 hours as needed for Nausea or Vomiting             simvastatin (ZOCOR) 40 MG tablet  TAKE 1 TABLET AT BEDTIME                 Infection:      Disposition:  home    Discharged Condition:  Stable    Follow Up:  Jefferey Pica, CNP in one week     Activity:  activity as tolerated    Diet:  regular diet    Time Spent on discharge is more than 20 minutes in the examination, evaluation, counseling and review of medications and discharge plan.      Electronically signed by Reesa Chew, DO on 06/28/2014 at 11:40 AM     Thank you Diane Durward Fortes, CNP for the opportunity to  be involved in this patient's care.

## 2014-07-12 ENCOUNTER — Encounter: Attending: Family | Primary: Family

## 2014-08-20 MED ORDER — SODIUM CHLORIDE 0.9 % IV SOLN
0.9 % | INTRAVENOUS | Status: DC
Start: 2014-08-20 — End: 2014-08-21
  Administered 2014-08-20: 11:00:00 via INTRAVENOUS

## 2014-08-20 MED FILL — DERMOPLAST 20-0.5 % EX AERO: CUTANEOUS | Qty: 56

## 2014-08-20 MED FILL — FENTANYL CITRATE (PF) 100 MCG/2ML IJ SOLN: 100 MCG/2ML | INTRAMUSCULAR | Qty: 2

## 2014-08-20 MED FILL — MIDAZOLAM HCL 2 MG/2ML IJ SOLN: 2 MG/ML | INTRAMUSCULAR | Qty: 2

## 2014-08-20 NOTE — H&P (Signed)
History and Physical    Pt Name: Kristen Gill  MRN: [id number]  Birth date: December 13, 1928  Date of evaluation: 08/20/2014    Chief Complaint/History of Presenting Problem: Esophagus ulcer.    This is a 81 years old female patient with history of dysphagia, admitted in the hospital with food getting stuck, had egd,  Patient is schedule for egd, she denies now any trouble with swallowing, abdominal pain, nausea, vomiting diarrhea,  constipation,pancreatitis,GI bleeding, hepatitis.    Past Medical History/ROS:   has a past medical history of Depression; Esophageal dilatation; Hyperlipidemia; Hypertension; Hypothyroidism; IDDM (insulin dependent diabetes mellitus) (HCC); Ischemic colitis (HCC); Oropharyngeal dysphagia; Osteoarthritis; and Ulcer of esophagus without bleeding.   She wears glasses.    Past Surgical History:   has a past surgical history that includes Hysterectomy; Cholecystectomy; Thyroidectomy; Colonoscopy (04/14/2008); Colonoscopy (06/03/2008); Upper gastrointestinal endoscopy (2010); Upper gastrointestinal endoscopy (10/14/2008); Upper gastrointestinal endoscopy (09/12/2011); and Upper gastrointestinal endoscopy (06/02/14).      Family History:  family history includes Birth Defects in her brother; Cancer in her brother and sister; Heart Disease in her father; Other in her mother.      Social History:   reports that she has never smoked. She has never used smokeless tobacco.     reports that she does not drink alcohol.     reports that she does not use illicit drugs.      Allergies:  is allergic to pravastatin.    Medications:  Prior to Admission medications    Medication Sig Start Date End Date Taking? Authorizing Provider   acetaminophen (TYLENOL) 160 MG/5ML solution Take 20.3 mLs by mouth every 4 hours as needed 06/03/14  Yes Marry Guan, DO   Cholecalciferol (VITAMIN D) 2000 UNITS CAPS capsule Take 2,000 Units by mouth daily   Yes Historical Provider, MD   ondansetron (ZOFRAN-ODT) 4 MG disintegrating tablet  Take 1 tablet by mouth every 8 hours as needed for Nausea or Vomiting 05/31/14  Yes Diane Ewell Poe, CNP   amitriptyline (ELAVIL) 75 MG tablet Take 1 tablet by mouth 2 times daily 04/01/14  Yes Diane Ewell Poe, CNP   HUMULIN N 100 UNIT/ML injection vial INJECT 100 UNITS UNDER THE SKIN TWICE A DAY BEFORE MEALS  Patient taking differently: 30 units BID 03/26/14  Yes Marjory Sneddon, CNP   levothyroxine (SYNTHROID) 150 MCG tablet TAKE 1 TABLET EVERY MORNING 10/26/13  Yes Marjory Sneddon, CNP   simvastatin (ZOCOR) 40 MG tablet TAKE 1 TABLET AT BEDTIME 10/26/13  Yes Marjory Sneddon, CNP   lisinopril (PRINIVIL;ZESTRIL) 10 MG tablet Take 1 tablet by mouth daily 10/26/13  Yes Marjory Sneddon, CNP   insulin regular (HUMULIN R) 100 UNIT/ML injection Every evening with largest meal  Patient taking differently: Inject into the skin Daily with supper Indications: if BS >300, 5 units of insulin  08/01/12  Yes Marjory Sneddon, CNP   ascorbic acid (VITAMIN C) 500 MG tablet Take 500 mg by mouth daily.   Yes Historical Provider, MD   Omega-3 Fatty Acids (OMEGA-3 FISH OIL PO) Take 1,000 mg by mouth daily    Yes Historical Provider, MD   Cyanocobalamin ([employer]S B-12 PO) Take 2 tablets by mouth daily    Yes Historical Provider, MD   calcium & magnesium carbonates (MYLANTA) 311-232 MG per tablet Take 1 tablet by mouth daily    Historical Provider, MD   Insulin Syringe-Needle U-100 (B-D INS SYR ULTRAFINE 1CC/30G) 30G X 1/2" 1 ML MISC Inject 1  each into the skin 2 times daily 12/14/13   Marjory Sneddon, CNP   glucagon 1 MG injection Inject 1 mg into the skin See Admin Instructions Use for symptomatic hypoglycemia only when patient is unable to take oral food or glucose tabs 12/01/13   Michelle Piper, CNP        Physical Exam:  VITALS:  height is 5\' 6"  (1.676 m) and weight is 152 lb (68.9 kg). Her oral temperature is 97 ??F (36.1 ??C). Her blood pressure is 168/76 and her pulse is 93. Her respiration is 18 and oxygen  saturation is 98%.   General Appearance/Mental Status: Patient is alert, cooperative, NAD, oriented x 3.    HEENT/Neck:  WNL.  Non-tender.  Eyes reactive bilaterally.    Heart: Regular rate and rhythm, no murmurs appreciated.    Lungs:  Excursion good bilaterally. No rales, no rhonchi.    Abdomen:  Bowel sounds positive, soft, non-tender.   Extremities: ROM good.  Non tender, no edema noted.    Neurovascular:  Intact.      Provisional Diagnosis:   History of Esophagus ulcer.  history of Depression; Esophageal dilatation; Hyperlipidemia; Hypertension; Hypothyroidism; IDDM (insulin dependent diabetes mellitus) (HCC); Ischemic colitis (HCC); Oropharyngeal dysphagia; Osteoarthritis; and Ulcer of esophagus without bleeding.      Ferdinand Lango, PA-C  Electronically signed 08/20/2014 at 7:31 AM

## 2014-08-20 NOTE — Other (Addendum)
Patient Acct Nbr:  [id number]  Primary AUTH/CERT:  [facility]  Primary Insurance Company Name:   MEDICARE  Primary Insurance Plan Name:    Primary Insurance Group Number:    Primary Insurance Plan Type: [facility]  Primary Insurance Policy Number:  [id number]    Secondary AUTH/CERT:  [facility]  Secondary Insurance Company Name:   [employer] [facility]  Secondary Insurance Plan Name:    Secondary Insurance Group Number:  [id number]  Secondary Insurance Plan Type: [employer] Insurance Policy Number:  [id number]

## 2014-08-20 NOTE — Op Note (Signed)
ESOPHAGOGASTRODUODENOSCOPY   ( EGD )  DATE OF PROCEDURE: 08/20/2014     SURGEON: Jane Canary, MD    ASSISTANT: None    PREOPERATIVE DIAGNOSIS: dysphagia    POSTOPERATIVE DIAGNOSIS: ? Esophageal motility problems    OPERATION: Upper GI endoscopy with Biopsy, maloney dilation of esophagus up to 46 fr size    ANESTHESIA: Moderate Sedation     ESTIMATED BLOOD LOSS: None    COMPLICATIONS: None.     SPECIMENS:  Was Obtained: esophagus    HISTORY: The patient is a 81 y.o. year old female with history of above preop diagnosis.  I recommended esophagogastroduodenoscopy with possible biopsy and I explained the risk, benefits, expected outcome, and alternatives to the procedure.  Risks included but are not limited to bleeding, infection, respiratory distress, hypotension, and perforation of the esophagus, stomach, or duodenum.  Patient understands and is in agreement.    PROCEDURE: The patient was given IV conscious sedation.  The patient's SPO2 remained above 90% throughout the procedure. Cetacaine spray given.  Patient placed in left lateral position.  Olympus GIF 160 videogastroscope was inserted orally under vision into the esophagus without difficulty and advanced into the stomach then through the pylorus up to the second part of duodenum.      Findings:    Retropharyngeal area was grossly normal appearing    Esophagus: normal, ? Motility problems  empric dilation of esophagus with 42 and 46 fr maloney dilators. No stretch marks seen following dilation. Dilation done in the usual and recommended fashion  Multiple biopsies taken to r/o EoE  Stomach:    Fundus and Cardia Examined in Retroflexed View: normal    Body: normal    Antrum: normal    Duodenum:     Descending: normal    Bulb: normal    While withdrawing the scope the above findings were verified and the scope was removed.  The patient tolerated the procedure well.     Recommendations/Plan:   1. F/U Biopsies  2. F/U In Office as instructed  3. Discussed with  the family  4. Make food in to small pieces    Electronically signed by Jane Canary, MD  on 08/20/2014 at 8:49 AM

## 2014-08-23 ENCOUNTER — Encounter

## 2014-08-23 LAB — SURGICAL PATHOLOGY

## 2014-08-23 LAB — POC GLUCOSE FINGERSTICK: POC Glucose: 108 mg/dL — ABNORMAL HIGH (ref 65–105)

## 2014-08-23 MED ORDER — LEVOTHYROXINE SODIUM 150 MCG PO TABS
150 MCG | ORAL_TABLET | ORAL | 2 refills | Status: DC
Start: 2014-08-23 — End: 2015-05-18

## 2014-08-23 NOTE — Telephone Encounter (Signed)
Pt aware of glucose level as stated below by provider

## 2014-08-23 NOTE — Telephone Encounter (Signed)
-----   Message from Michelle Piper, CNP sent at 08/23/2014  8:16 AM EDT -----  Glucose level in good range for diabetic patient

## 2014-09-07 ENCOUNTER — Ambulatory Visit: Admit: 2014-09-07 | Discharge: 2014-09-07 | Payer: MEDICARE | Attending: Gastroenterology | Primary: Family

## 2014-09-07 DIAGNOSIS — R1319 Other dysphagia: Secondary | ICD-10-CM

## 2014-09-07 NOTE — Progress Notes (Signed)
Subjective:      Patient ID: Kristen Gill is a 81 y.o. female.    HPI   Dr. Michelle Piper, CNP our mutual patient Kristen Gill was seen  for   1. Esophagitis     .  Patient seen along with her daughter.  Recently, patient had a EGD done and esophageal ulcer was healed.  I did dilate the esophagus with 46 French Maloney dilator, and no stretch marks noted following the dilatation.    It appears patient may have esophageal motility issues.    Recently patient is able to tolerate food without difficulty.  Past Medical, Family, and Social History reviewed and does contribute to the patient presenting condition.    patient"s PMH/PSH,SH,PSYCH hx, MEDs, ALLERGIES, and ROS was all reviewed and updated ion the appropriate sections      Review of Systems   Constitutional: Negative.  Negative for activity change, appetite change, chills, diaphoresis, fatigue, fever and unexpected weight change.   HENT: Positive for dental problem (upper dentures), hearing loss and postnasal drip. Negative for congestion, drooling, ear discharge, ear pain, facial swelling, mouth sores, nosebleeds, rhinorrhea, sinus pressure, sneezing, sore throat, tinnitus, trouble swallowing and voice change.    Eyes: Positive for visual disturbance (glasses).   Respiratory: Negative.  Negative for cough and shortness of breath.    Cardiovascular: Negative.  Negative for chest pain and leg swelling.   Gastrointestinal: Negative.  Negative for abdominal distention, abdominal pain, anal bleeding, blood in stool, constipation, diarrhea, nausea, rectal pain and vomiting.   Endocrine: Negative.  Negative for polyphagia and polyuria.   Genitourinary: Negative.  Negative for difficulty urinating, dysuria, vaginal bleeding and vaginal discharge.   Musculoskeletal: Positive for back pain. Negative for arthralgias and gait problem.   Skin: Negative.    Allergic/Immunologic: Negative.  Negative for environmental allergies and food allergies.   Neurological:  Positive for numbness (left leg numbness). Negative for dizziness, tremors, seizures, syncope, facial asymmetry, speech difficulty, weakness, light-headedness and headaches.        In constant motion   Hematological: Negative.  Negative for adenopathy. Does not bruise/bleed easily.   Psychiatric/Behavioral: Negative.  Negative for sleep disturbance. The patient is not nervous/anxious.        Objective:   Physical Exam   Constitutional: She is oriented to person, place, and time. She appears well-developed and well-nourished.   HENT:   Head: Normocephalic and atraumatic.   No oral lesions   Eyes: Conjunctivae are normal. Pupils are equal, round, and reactive to light. No scleral icterus.   Neck: Normal range of motion. Neck supple. No hepatojugular reflux and no JVD present. No tracheal deviation present. No thyromegaly present.   Cardiovascular: Normal rate, regular rhythm, normal heart sounds and intact distal pulses.    Pulmonary/Chest: Effort normal and breath sounds normal. No respiratory distress. She has no wheezes. She has no rales.   Abdominal: Soft. Bowel sounds are normal. She exhibits no distension, no ascites and no mass. There is no hepatomegaly. There is no tenderness. There is no rebound. No hernia.   Musculoskeletal: She exhibits no edema or tenderness.   No joint swelling   Lymphadenopathy:     She has no cervical adenopathy.   Neurological: She is alert and oriented to person, place, and time. No cranial nerve deficit.   Skin: Skin is warm. No bruising, no ecchymosis and no rash noted. No erythema.   Psychiatric: Thought content normal.   Nursing note and vitals reviewed.  Assessment:      1. Other dysphagia     2. Esophagitis               Plan:      Advised to make food into small pieces, chew well and swallow.  Also advised to drink liquids while eating solid food.  Both patient and patient's daughter understood agreed.  If she has any further issues to contact me.

## 2014-09-26 ENCOUNTER — Encounter

## 2014-09-27 MED ORDER — SIMVASTATIN 40 MG PO TABS
40 MG | ORAL_TABLET | ORAL | 2 refills | Status: DC
Start: 2014-09-27 — End: 2015-06-26

## 2014-10-07 ENCOUNTER — Ambulatory Visit: Admit: 2014-10-07 | Discharge: 2014-10-07 | Payer: MEDICARE | Attending: Family | Primary: Family

## 2014-10-07 DIAGNOSIS — Z794 Long term (current) use of insulin: Secondary | ICD-10-CM

## 2014-10-07 LAB — POCT GLYCOSYLATED HEMOGLOBIN (HGB A1C): Hemoglobin A1C: 8.2 %

## 2014-10-07 MED ORDER — OMEPRAZOLE 20 MG PO CPDR
20 MG | ORAL_CAPSULE | Freq: Every day | ORAL | 3 refills | Status: DC
Start: 2014-10-07 — End: 2014-10-07

## 2014-10-07 MED ORDER — ACETAMINOPHEN-CODEINE #3 300-30 MG PO TABS
300-30 MG | ORAL_TABLET | Freq: Four times a day (QID) | ORAL | 0 refills | Status: DC | PRN
Start: 2014-10-07 — End: 2014-10-07

## 2014-10-07 MED ORDER — ACETAMINOPHEN-CODEINE #3 300-30 MG PO TABS
300-30 MG | ORAL_TABLET | Freq: Four times a day (QID) | ORAL | 0 refills | Status: DC | PRN
Start: 2014-10-07 — End: 2015-04-18

## 2014-10-07 MED ORDER — OMEPRAZOLE 20 MG PO CPDR
20 MG | ORAL_CAPSULE | Freq: Every day | ORAL | 0 refills | Status: AC
Start: 2014-10-07 — End: ?

## 2014-10-07 NOTE — Progress Notes (Signed)
[facility]  [address]  [address] [place] [address]  Dept: [phone]  Dept Fax: [phone]    Kristen Gill is a 81 y.o. female who presents today for her medical conditions/complaints as noted below.  Kristen Gill is c/o of   Chief Complaint   Patient presents with   ??? Diabetes     6 month check up   ??? Hyperlipidemia     6 month check up   ??? Hypertension     6 month check up     DIABETES and HYPERTENSION visit    BP Readings from Last 3 Encounters:   10/07/14 152/66   09/07/14 136/60   08/20/14 124/56        HEMOGLOBIN A1C (%)   Date Value   10/07/2014 8.2   11/26/2013 6.2 (H)   11/17/2013 6.4     MICROALB/CRT. RATIO (mcg/mg creat)   Date Value   11/26/2013 13     LDL CHOLESTEROL (mg/dL)   Date Value   11/26/2013 65     HDL (mg/dL)   Date Value   11/26/2013 72     BUN (mg/dL)   Date Value   06/02/2014 24 (H)     CREATININE (mg/dL)   Date Value   06/02/2014 0.96 (H)     GLUCOSE (mg/dL)   Date Value   06/02/2014 130 (H)            Are you taking your medications for Diabetes? -  Yes   Are you having any side effects from Diabetes medications? - No    Are you taking your medications for hypertension? -   Yes   Are you having any side effects from hypertension medications? -  No    Are you taking any cholesterol medication?  -  Yes   Are you having any side effects from cholesterol medications? -  No    Are you taking an Aspirin daily? -  No               Are you taking all your prescribed medications? Yes             If NO, why? -  N/A    Are you taking any over-the-counter medicines, vitamins, or herbal medicines? yes - see med list     Have you changed or stopped any medications since your last visit including any over-the-counter medicines, vitamins, or herbal medicines? no     Have you seen any other physician or provider since your last visit yes - Dr. Towanda Malkin    Have you had any other diagnostic tests since your last visit? no    Have you  had your annual diabetic retinal (eye) exam?  yes - [id number]    Diabetic Patient Self-Management Goal(s):    []  Increase activity-exercise for 30 min daily - 3 to 4 days per week   []  Improved diet - adhere to recommended diabetic diet/low cholesterol/low sodium diet    []  Take medications as prescribed and call the office if any troubles with medications develop   []  will check blood sugars at least one time daily and blood pressure weekly   []  schedule an appointment with a diabetes educator   []  schedule yearly diabetic eye exam   []  will work on weight reduction program   []  dm f/u  Barriers to success: none  Plan for overcoming my barriers: N/A  Confidence: 10/10  Date goal set: 10/07/14  Date expected to reach goal: 1day               Microalbumin performed if applicable? - No   (due every 6 to 12 months)    Shoes and socks removed? No  Monofilament placed on counter? - No    Patient Self-Management Goal(s) for managing Hypertension:   []  I will increase my physical activity level  []  I will exercise for 30 minutes, 3 to 5 days a week  []  I will take all medications as prescribed  []  I will follow low cholesterol/low sodium diet recommendation  []  I will work on weight reduction  []  I will follow up with specialists as recommend  []  I will check my blood pressure weekly   []  htn re-check  Barriers to success: none  Plan for overcoming my barriers: N/A     Confidence: 10/10  Date goal set: 10/07/14  Date expected to reach goal: 1day    Tobacco use:  Patient  reports that she has never smoked. She has never used smokeless tobacco.    If Smoker - Cessation materials given? NA       1-800-QUIT-NOW [phone])     Diabetic Health Maintenance   Health Maintenance Due   Topic Date Due   ??? DTaP/Tdap/Td vaccine (1 - Tdap) 09/15/1947   ??? ZOSTAVAX VACCINE  09/16/1990   ??? PNEUMO VAC >= 65 (2 of 2 - PCV13) 01/31/2013   ??? Flu Vaccine (1) 08/16/2014       Medical history Review  Past Medical, Family, and Social History  reviewed and does contribute to the patient presenting condition    HPI:     HPI Comments: Here today for follow up  Reports her glucose levels have been stable  Recent EGD with esoph dilation  Reports has been swallowing better but trying to stay with softer foods, finely cut meats    Complains of vibrating/tingling sensation in her feet for the past couple years, has not changed  Complaining of increased arthritis pain as has not been taking any NSAIDS for the past few months due to her GI condition  Reports that tylenol#3 has worked well in the past  Otherwise denies any other concerns, feeing well in general    Diabetes   She presents for her follow-up diabetic visit. She has type 2 diabetes mellitus. Her disease course has been stable. There are no hypoglycemic associated symptoms. Pertinent negatives for hypoglycemia include no confusion, dizziness, headaches or nervousness/anxiousness. Associated symptoms include foot paresthesias. Pertinent negatives for diabetes include no blurred vision, no chest pain, no fatigue, no foot ulcerations, no polydipsia, no polyphagia, no polyuria and no weakness. Symptoms are stable. Diabetic complications include peripheral neuropathy. Current diabetic treatment includes insulin injections. She is compliant with treatment most of the time. She is following a generally healthy diet. An ACE inhibitor/angiotensin II receptor blocker is being taken.       HEMOGLOBIN A1C (%)   Date Value   10/07/2014 8.2   11/26/2013 6.2 (H)   11/17/2013 6.4             ( goal A1C is < 7)   MICROALB/CRT. RATIO (mcg/mg creat)   Date Value   11/26/2013 13     LDL CHOLESTEROL (mg/dL)   Date Value   11/26/2013 65   01/26/2012 92   07/18/2010 156 (H)       (goal LDL is <100)   AST (  U/L)   Date Value   06/01/2014 22     ALT (U/L)   Date Value   06/01/2014 18     BUN (mg/dL)   Date Value   06/02/2014 24 (H)     BP Readings from Last 3 Encounters:   10/07/14 152/66   09/07/14 136/60   08/20/14 124/56           (goal 120/80)    Past Medical History   Diagnosis Date   ??? Depression    ??? Esophageal dilatation    ??? Hyperlipidemia    ??? Hypertension    ??? Hypothyroidism    ??? IDDM (insulin dependent diabetes mellitus) ([facility])    ??? Ischemic colitis ([facility]) 2010   ??? Oropharyngeal dysphagia 06/02/2014   ??? Osteoarthritis    ??? Ulcer of esophagus without bleeding 06/02/2014      Past Surgical History   Procedure Laterality Date   ??? Hysterectomy     ??? Cholecystectomy     ??? Thyroidectomy     ??? Colonoscopy  04/14/2008     severe ischemic colitis   ??? Colonoscopy  06/03/2008     redundant colon, a lot of adhesions, poor prep, diverticula   ??? Upper gastrointestinal endoscopy  2010     dilation   ??? Upper gastrointestinal endoscopy  10/14/2008     esophageal lesion, biopsy shows inflammatory changes   ??? Upper gastrointestinal endoscopy  09/12/2011     small ulcer upper part of stomach, reactive gastropathy   ??? Upper gastrointestinal endoscopy  06/02/14     with removal of retained food and biopsy   ??? Upper gastrointestinal endoscopy  08/20/2014     ? esophageal motility problem, maloney dilation of esophagus up to 45 fr size       Family History   Problem Relation Age of Onset   ??? Other Mother      died at age 24 of old age   ??? Heart Disease Father    ??? Cancer Sister      lung   ??? Cancer Brother    ??? Birth Defects Brother      unknown site       Social History   Substance Use Topics   ??? Smoking status: Never Smoker   ??? Smokeless tobacco: Never Used   ??? Alcohol use No      Current Outpatient Prescriptions   Medication Sig Dispense Refill   ??? omeprazole (PRILOSEC) 20 MG delayed release capsule Take 1 capsule by mouth daily 14 capsule 0   ??? acetaminophen-codeine (TYLENOL #3) 300-30 MG per tablet Take 1 tablet by mouth every 6 hours as needed for Pain 20 tablet 0   ??? simvastatin (ZOCOR) 40 MG tablet TAKE 1 TABLET AT BEDTIME 90 tablet 2   ??? levothyroxine (SYNTHROID) 150 MCG tablet TAKE 1 TABLET EVERY MORNING 90 tablet 2   ??? Cholecalciferol (VITAMIN  D) 2000 UNITS CAPS capsule Take 2,000 Units by mouth daily     ??? ondansetron (ZOFRAN-ODT) 4 MG disintegrating tablet Take 1 tablet by mouth every 8 hours as needed for Nausea or Vomiting 30 tablet 1   ??? amitriptyline (ELAVIL) 75 MG tablet Take 1 tablet by mouth 2 times daily 180 tablet 2   ??? HUMULIN N 100 UNIT/ML injection vial INJECT 100 UNITS UNDER THE SKIN TWICE A DAY BEFORE MEALS (Patient taking differently: 32 units BID) 15 vial 2   ??? Insulin Syringe-Needle U-100 (B-D INS SYR ULTRAFINE 1CC/30G) 30G  X 1/2" 1 ML MISC Inject 1 each into the skin 2 times daily 100 each 5   ??? glucagon 1 MG injection Inject 1 mg into the skin See Admin Instructions Use for symptomatic hypoglycemia only when patient is unable to take oral food or glucose tabs 1 kit 3   ??? lisinopril (PRINIVIL;ZESTRIL) 10 MG tablet Take 1 tablet by mouth daily 90 tablet 3   ??? insulin regular (HUMULIN R) 100 UNIT/ML injection Every evening with largest meal (Patient taking differently: Inject into the skin Daily with supper Indications: if BS >300, 5 units of insulin ) 3 vial 2   ??? ascorbic acid (VITAMIN C) 500 MG tablet Take 500 mg by mouth daily.     ??? Omega-3 Fatty Acids (OMEGA-3 FISH OIL PO) Take 1,000 mg by mouth daily      ??? Cyanocobalamin ([employer] B-12 PO) Take 2 tablets by mouth daily      ??? acetaminophen (TYLENOL) 160 MG/5ML solution Take 20.3 mLs by mouth every 4 hours as needed 473 mL 3     No current facility-administered medications for this visit.      Allergies   Allergen Reactions   ??? Pravastatin      Pt states read about and refuses to take        Health Maintenance   Topic Date Due   ??? DTaP/Tdap/Td vaccine (1 - Tdap) 09/15/1947   ??? ZOSTAVAX VACCINE  09/16/1990   ??? PNEUMO VAC >= 65 (2 of 2 - PCV13) 01/31/2013   ??? Flu Vaccine (1) 08/16/2014   ??? DEXA SCAN WOMEN (modify frequency per FRAX score)  Addressed       Subjective:      Review of Systems   Constitutional: Negative for activity change, appetite change, chills, fatigue, fever and  unexpected weight change.   HENT: Negative for congestion, ear pain, hearing loss, sinus pressure, sore throat and trouble swallowing.    Eyes: Negative for blurred vision and visual disturbance.   Respiratory: Negative for cough, shortness of breath and wheezing.    Cardiovascular: Negative for chest pain, palpitations and leg swelling.   Gastrointestinal: Negative for abdominal pain, blood in stool, constipation, diarrhea, nausea and vomiting.   Endocrine: Negative for cold intolerance, heat intolerance, polydipsia, polyphagia and polyuria.   Genitourinary: Negative for difficulty urinating, frequency, hematuria and urgency.   Musculoskeletal: Positive for arthralgias and myalgias.   Skin: Negative for rash.   Allergic/Immunologic: Negative for environmental allergies.   Neurological: Negative for dizziness, weakness, light-headedness and headaches.   Psychiatric/Behavioral: Negative for confusion. The patient is not nervous/anxious.        Objective:     Physical Exam   Constitutional: She is oriented to person, place, and time. She appears well-developed and well-nourished.   HENT:   Head: Normocephalic.   Eyes: Conjunctivae and EOM are normal. Pupils are equal, round, and reactive to light. Right eye exhibits no discharge.   Neck: Normal range of motion.   Cardiovascular: Normal rate, regular rhythm, normal heart sounds and intact distal pulses.    No murmur heard.  Pulmonary/Chest: Effort normal and breath sounds normal. She has no wheezes.   Abdominal: Soft. Bowel sounds are normal. She exhibits no distension.   Musculoskeletal: Normal range of motion.   Neurological: She is alert and oriented to person, place, and time.   Skin: Skin is warm and dry.   Psychiatric: She has a normal mood and affect. Her behavior is normal. Judgment and thought content  normal.     Visit Vitals   ??? BP 152/66 (Site: Left Arm, Position: Sitting, Cuff Size: Medium Adult)   ??? Pulse 68   ??? Resp 18   ??? Wt 152 lb 3.2 oz (69 kg)   ??? BMI  24.57 kg/m2       Assessment:      1. Type 2 diabetes mellitus without complication, with long-term current use of insulin (HCC)  POCT glycosylated hemoglobin (Hb A1C)   2. Essential hypertension     3. Other hyperlipidemia  Lipid Panel   4. Primary osteoarthritis involving multiple joints  acetaminophen-codeine (TYLENOL #3) 300-30 MG per tablet    DISCONTINUED: acetaminophen-codeine (TYLENOL #3) 300-30 MG per tablet   5. Esophagitis  omeprazole (PRILOSEC) 20 MG delayed release capsule    DISCONTINUED: omeprazole (PRILOSEC) 20 MG delayed release capsule   6. Oropharyngeal dysphagia  omeprazole (PRILOSEC) 20 MG delayed release capsule    DISCONTINUED: omeprazole (PRILOSEC) 20 MG delayed release capsule             Plan:      Return in about 4 months (around 02/06/2015) for diabetes check.    Orders Placed This Encounter   Procedures   ??? Lipid Panel     Standing Status:   Future     Standing Expiration Date:   10/07/2015     Order Specific Question:   Is Patient Fasting?/# of Hours     Answer:   8   ??? POCT glycosylated hemoglobin (Hb A1C)     Orders Placed This Encounter   Medications   ??? DISCONTD: omeprazole (PRILOSEC) 20 MG delayed release capsule     Sig: Take 1 capsule by mouth daily     Dispense:  90 capsule     Refill:  3   ??? DISCONTD: acetaminophen-codeine (TYLENOL #3) 300-30 MG per tablet     Sig: Take 1 tablet by mouth every 6 hours as needed for Pain     Dispense:  90 tablet     Refill:  0   ??? omeprazole (PRILOSEC) 20 MG delayed release capsule     Sig: Take 1 capsule by mouth daily     Dispense:  14 capsule     Refill:  0   ??? acetaminophen-codeine (TYLENOL #3) 300-30 MG per tablet     Sig: Take 1 tablet by mouth every 6 hours as needed for Pain     Dispense:  20 tablet     Refill:  0      Diabetes, HTN, HLD-all stable, continue current meds, will check lipid panel, reveiwed recent CBC and BMP in chart  Osteoarthritis-tyl 3 for pain as patient unable to take NSAIDS due to GI condition  Esophagitis,  dysphagia-much improved, continue omelprazole   Patient given educational materials - see patient instructions.  Discussed use, benefit, and side effects of prescribed medications.  All patient questions answered. Pt voiced understanding. Reviewed health maintenance.  Instructed to continue current medications, diet and exercise.  Patient agreed with treatment plan. Follow up as directed.     Electronically signed by Jefferey Pica, CNP on 10/08/2014 at 8:11 AM

## 2014-11-06 ENCOUNTER — Encounter

## 2014-11-08 MED ORDER — LISINOPRIL 10 MG PO TABS
10 MG | ORAL_TABLET | ORAL | 2 refills | Status: DC
Start: 2014-11-08 — End: 2015-08-05

## 2014-12-29 NOTE — Telephone Encounter (Signed)
Pharmacy request refill on elavil, last office visit 10-07-14, approve or deny.

## 2014-12-30 MED ORDER — AMITRIPTYLINE HCL 75 MG PO TABS
75 MG | ORAL_TABLET | ORAL | 1 refills | Status: DC
Start: 2014-12-30 — End: 2015-07-02

## 2015-02-07 ENCOUNTER — Ambulatory Visit: Admit: 2015-02-07 | Discharge: 2015-02-07 | Payer: MEDICARE | Attending: Family | Primary: Family

## 2015-02-07 DIAGNOSIS — IMO0001 Reserved for inherently not codable concepts without codable children: Secondary | ICD-10-CM

## 2015-02-07 DIAGNOSIS — I10 Essential (primary) hypertension: Secondary | ICD-10-CM | POA: Diagnosis not present

## 2015-02-07 DIAGNOSIS — Z794 Long term (current) use of insulin: Secondary | ICD-10-CM | POA: Diagnosis not present

## 2015-02-07 DIAGNOSIS — M150 Primary generalized (osteo)arthritis: Secondary | ICD-10-CM | POA: Diagnosis not present

## 2015-02-07 DIAGNOSIS — E039 Hypothyroidism, unspecified: Secondary | ICD-10-CM | POA: Diagnosis not present

## 2015-02-07 DIAGNOSIS — E784 Other hyperlipidemia: Secondary | ICD-10-CM | POA: Diagnosis not present

## 2015-02-07 DIAGNOSIS — N183 Chronic kidney disease, stage 3 (moderate): Secondary | ICD-10-CM | POA: Diagnosis not present

## 2015-02-07 DIAGNOSIS — E119 Type 2 diabetes mellitus without complications: Secondary | ICD-10-CM | POA: Diagnosis not present

## 2015-02-07 LAB — POCT GLYCOSYLATED HEMOGLOBIN (HGB A1C): Hemoglobin A1C: 7.9 %

## 2015-02-07 NOTE — Progress Notes (Signed)
[facility]  [address]  [address] [address]  Dept: [phone]  Dept Fax: [phone]    Kristen Gill is a 82 y.o. female who presents today for her medical conditions/complaints as noted below.  Kristen Gill is c/o of   Chief Complaint   Patient presents with   ??? Diabetes     4 month recheck   ??? Hypertension     4 month recheck   ??? Hyperlipidemia     4 month re-check     DIABETES and HYPERTENSION visit    BP Readings from Last 3 Encounters:   02/07/15 (!) 138/58   10/07/14 152/66   09/07/14 136/60        Hemoglobin A1C (%)   Date Value   02/07/2015 7.9   10/07/2014 8.2   11/26/2013 6.2 (H)     Microalb/Crt. Ratio (mcg/mg creat)   Date Value   11/26/2013 13     LDL Cholesterol (mg/dL)   Date Value   11/26/2013 65     HDL (mg/dL)   Date Value   11/26/2013 72     BUN (mg/dL)   Date Value   06/02/2014 24 (H)     CREATININE (mg/dL)   Date Value   06/02/2014 0.96 (H)     Glucose (mg/dL)   Date Value   06/02/2014 130 (H)            Have you changed or started any medications since your last visit including any over-the-counter medicines, vitamins, or herbal medicines? no   Have you stopped taking any of your medications? Is so, why? -  no  Are you having any side effects from any of your medications? - no    Have you seen any other physician or provider since your last visit?  no   Have you had any other diagnostic tests since your last visit?  no   Have you been seen in the emergency room and/or had an admission in a hospital since we last saw you?  no   Have you had your routine dental cleaning in the past 6 months?  no     Do you have an active MyChart account? If no, what is the barrier?  No: code expired    Patient Care Team:  Jefferey Pica, CNP as PCP - General (Family Nurse Practitioner)  Feliberto Gottron, MD as Consulting Physician (Gastroenterology)    Medical History Review  Past Medical, Family, and Social History reviewed  and does contribute to the patient presenting condition    Health Maintenance   Topic Date Due   ??? DTaP/Tdap/Td vaccine (1 - Tdap) 09/15/1947   ??? Zostavax vaccine  09/16/1990   ??? Pneumococcal low/med risk (2 of 2 - PCV13) 01/31/2013   ??? Flu vaccine (1) 09/23/2015 (Originally 08/16/2014)   ??? DEXA (modify frequency per FRAX score)  Addressed         HPI:     Diabetes   She presents for her follow-up diabetic visit. She has type 2 diabetes mellitus. Her disease course has been stable. Pertinent negatives for hypoglycemia include no confusion, dizziness, headaches or nervousness/anxiousness. Pertinent negatives for diabetes include no chest pain, no fatigue, no polydipsia, no polyphagia, no polyuria and no weakness. Symptoms are stable. Risk factors for coronary artery disease include dyslipidemia, diabetes mellitus, obesity, sedentary lifestyle and post-menopausal. Current diabetic treatment includes insulin injections and diet. Her weight is  stable. She is following a diabetic and generally healthy diet. There is no change in her home blood glucose trend. (Checks twice daily at home, ranges from 60-450) An ACE inhibitor/angiotensin II receptor blocker is being taken. She does not see a podiatrist.Eye exam current: due to go in April.   Hypertension   This is a chronic problem. The current episode started more than 1 year ago. The problem is unchanged. The problem is controlled. Pertinent negatives include no chest pain, headaches, palpitations or shortness of breath. Agents associated with hypertension include NSAIDs. Risk factors for coronary artery disease include diabetes mellitus, dyslipidemia, obesity, post-menopausal state and sedentary lifestyle. Past treatments include ACE inhibitors. There are no compliance problems.  Identifiable causes of hypertension include chronic renal disease.   Hyperlipidemia   This is a chronic problem. The current episode started more than 1 year ago. The problem is controlled. Lipid  results: needs to update lipid profile. Exacerbating diseases include chronic renal disease, diabetes, hypothyroidism and obesity. There are no known factors aggravating her hyperlipidemia. Pertinent negatives include no chest pain, myalgias or shortness of breath. She is currently on no antihyperlipidemic treatment. There are no compliance problems.  Risk factors for coronary artery disease include diabetes mellitus, dyslipidemia, hypertension, obesity, post-menopausal and a sedentary lifestyle.       Hemoglobin A1C (%)   Date Value   02/07/2015 7.9   10/07/2014 8.2   11/26/2013 6.2 (H)             ( goal A1C is < 7)   Microalb/Crt. Ratio (mcg/mg creat)   Date Value   11/26/2013 13     LDL Cholesterol (mg/dL)   Date Value   11/26/2013 65   01/26/2012 92   07/18/2010 156 (H)       (goal LDL is <100)   AST (U/L)   Date Value   06/01/2014 22     ALT (U/L)   Date Value   06/01/2014 18     BUN (mg/dL)   Date Value   06/02/2014 24 (H)     BP Readings from Last 3 Encounters:   02/07/15 (!) 138/58   10/07/14 152/66   09/07/14 136/60          (goal 120/80)    Past Medical History   Diagnosis Date   ??? Depression    ??? Esophageal dilatation    ??? Hyperlipidemia    ??? Hypertension    ??? Hypothyroidism    ??? IDDM (insulin dependent diabetes mellitus) ([facility])    ??? Ischemic colitis ([facility]) 2010   ??? Oropharyngeal dysphagia 06/02/2014   ??? Osteoarthritis    ??? Ulcer of esophagus without bleeding 06/02/2014      Past Surgical History   Procedure Laterality Date   ??? Hysterectomy     ??? Cholecystectomy     ??? Thyroidectomy     ??? Colonoscopy  04/14/2008     severe ischemic colitis   ??? Colonoscopy  06/03/2008     redundant colon, a lot of adhesions, poor prep, diverticula   ??? Upper gastrointestinal endoscopy  2010     dilation   ??? Upper gastrointestinal endoscopy  10/14/2008     esophageal lesion, biopsy shows inflammatory changes   ??? Upper gastrointestinal endoscopy  09/12/2011     small ulcer upper part of stomach, reactive gastropathy   ??? Upper  gastrointestinal endoscopy  06/02/14     with removal of retained food and biopsy   ??? Upper gastrointestinal endoscopy  08/20/2014     ? esophageal motility problem, maloney dilation of esophagus up to 45 fr size       Family History   Problem Relation Age of Onset   ??? Other Mother      died at age 6 of old age   ??? Heart Disease Father    ??? Cancer Sister      lung   ??? Cancer Brother    ??? Birth Defects Brother      unknown site       Social History   Substance Use Topics   ??? Smoking status: Never Smoker   ??? Smokeless tobacco: Never Used   ??? Alcohol use No      Current Outpatient Prescriptions   Medication Sig Dispense Refill   ??? amitriptyline (ELAVIL) 75 MG tablet Take one tablet twice a day 180 tablet 1   ??? lisinopril (PRINIVIL;ZESTRIL) 10 MG tablet TAKE 1 TABLET DAILY 90 tablet 2   ??? omeprazole (PRILOSEC) 20 MG delayed release capsule Take 1 capsule by mouth daily 14 capsule 0   ??? acetaminophen-codeine (TYLENOL #3) 300-30 MG per tablet Take 1 tablet by mouth every 6 hours as needed for Pain 20 tablet 0   ??? simvastatin (ZOCOR) 40 MG tablet TAKE 1 TABLET AT BEDTIME 90 tablet 2   ??? levothyroxine (SYNTHROID) 150 MCG tablet TAKE 1 TABLET EVERY MORNING 90 tablet 2   ??? acetaminophen (TYLENOL) 160 MG/5ML solution Take 20.3 mLs by mouth every 4 hours as needed 473 mL 3   ??? Cholecalciferol (VITAMIN D) 2000 UNITS CAPS capsule Take 2,000 Units by mouth daily     ??? ondansetron (ZOFRAN-ODT) 4 MG disintegrating tablet Take 1 tablet by mouth every 8 hours as needed for Nausea or Vomiting 30 tablet 1   ??? HUMULIN N 100 UNIT/ML injection vial INJECT 100 UNITS UNDER THE SKIN TWICE A DAY BEFORE MEALS (Patient taking differently: 32 units BID) 15 vial 2   ??? Insulin Syringe-Needle U-100 (B-D INS SYR ULTRAFINE 1CC/30G) 30G X 1/2" 1 ML MISC Inject 1 each into the skin 2 times daily 100 each 5   ??? glucagon 1 MG injection Inject 1 mg into the skin See Admin Instructions Use for symptomatic hypoglycemia only when patient is unable to take  oral food or glucose tabs 1 kit 3   ??? insulin regular (HUMULIN R) 100 UNIT/ML injection Every evening with largest meal (Patient taking differently: Inject into the skin Daily with supper Indications: if BS >300, 5 units of insulin ) 3 vial 2   ??? ascorbic acid (VITAMIN C) 500 MG tablet Take 500 mg by mouth daily.     ??? Omega-3 Fatty Acids (OMEGA-3 FISH OIL PO) Take 1,000 mg by mouth daily      ??? Cyanocobalamin ([employer] B-12 PO) Take 2 tablets by mouth daily        No current facility-administered medications for this visit.      Allergies   Allergen Reactions   ??? Pravastatin      Pt states read about and refuses to take        Health Maintenance   Topic Date Due   ??? DTaP/Tdap/Td vaccine (1 - Tdap) 09/15/1947   ??? Zostavax vaccine  09/16/1990   ??? Pneumococcal low/med risk (2 of 2 - PCV13) 01/31/2013   ??? Flu vaccine (1) 09/23/2015 (Originally 08/16/2014)   ??? DEXA (modify frequency per FRAX score)  Addressed       Subjective:  Review of Systems   Constitutional: Negative for activity change, appetite change, chills, fatigue, fever and unexpected weight change.   HENT: Negative for congestion, ear pain, hearing loss, sinus pressure, sore throat and trouble swallowing.    Eyes: Negative for visual disturbance.   Respiratory: Negative for cough, shortness of breath and wheezing.    Cardiovascular: Negative for chest pain, palpitations and leg swelling.   Gastrointestinal: Negative for abdominal pain, blood in stool, constipation, diarrhea, nausea and vomiting.   Endocrine: Negative for cold intolerance, heat intolerance, polydipsia, polyphagia and polyuria.   Genitourinary: Negative for difficulty urinating, frequency, hematuria and urgency.   Musculoskeletal: Negative for arthralgias and myalgias.   Skin: Negative for rash.   Allergic/Immunologic: Negative for environmental allergies.   Neurological: Negative for dizziness, weakness, light-headedness and headaches.   Psychiatric/Behavioral: Negative for confusion. The  patient is not nervous/anxious.        Objective:     Physical Exam   Constitutional: She is oriented to person, place, and time. She appears well-developed and well-nourished.   HENT:   Head: Normocephalic.   Eyes: Conjunctivae and EOM are normal. Pupils are equal, round, and reactive to light. Right eye exhibits no discharge.   Neck: Normal range of motion.   Cardiovascular: Normal rate, regular rhythm, normal heart sounds and intact distal pulses.    No murmur heard.  Pulmonary/Chest: Effort normal and breath sounds normal. She has no wheezes.   Abdominal: Soft. Bowel sounds are normal. She exhibits no distension.   Musculoskeletal: Normal range of motion.   Neurological: She is alert and oriented to person, place, and time.   Skin: Skin is warm and dry.   Psychiatric: She has a normal mood and affect. Her behavior is normal. Judgment and thought content normal.   Nursing note and vitals reviewed.    Visit Vitals   ??? BP (!) 138/58 (Site: Left Arm, Position: Sitting, Cuff Size: Medium Adult)   ??? Pulse 88   ??? Resp 18   ??? Ht 5' 3.5" (1.613 m)   ??? Wt 152 lb 9.6 oz (69.2 kg)   ??? BMI 26.61 kg/m2       Assessment:      1. IDDM (insulin dependent diabetes mellitus) (HCC)  POCT glycosylated hemoglobin (Hb A1C)    Microalbumin, Ur    CBC Auto Differential    Comprehensive Metabolic Panel   2. Other hyperlipidemia  Lipid Panel   3. CKD (chronic kidney disease) stage 3, GFR 30-59 ml/min     4. Essential hypertension     5. Acquired hypothyroidism  TSH with Reflex   6. Primary osteoarthritis involving multiple joints               Plan:      Return in about 3 months (around 05/08/2015) for diabetes check, hypertension check.    1. DM- Hga1c improved from 8.2 to 7.9. Continue insulin at current dose.  2. Hyperlipidemia- Rx given for annual labs.   3. CKD- Rx given for annual labs.  4. HTN- Well controlled, continue meds at current dose.   5. Hypothyroidism- Rx given for recheck TSH.   Emphasized importance of having labs drawn,  as they have been ordered and not completed. She drove herself to appointment today.    Orders Placed This Encounter   Procedures   ??? Microalbumin, Ur   ??? CBC Auto Differential     Standing Status:   Future     Standing Expiration Date:  03/13/2016   ??? Comprehensive Metabolic Panel     Standing Status:   Future     Standing Expiration Date:   03/13/2016   ??? Lipid Panel     Standing Status:   Future     Standing Expiration Date:   03/13/2016     Order Specific Question:   Is Patient Fasting?/# of Hours     Answer:   8   ??? TSH with Reflex     Standing Status:   Future     Standing Expiration Date:   02/07/2016   ??? POCT glycosylated hemoglobin (Hb A1C)     No orders of the defined types were placed in this encounter.      Patient given educational materials - see patient instructions.  Discussed use, benefit, and side effects of prescribed medications.  All patient questions answered. Pt voiced understanding. Reviewed health maintenance.  Instructed to continue current medications, diet and exercise.  Patient agreed with treatment plan. Follow up as directed.     Electronically signed by Jefferey Pica, CNP on 02/07/2015 at 1:36 PM

## 2015-02-08 LAB — MICROALBUMIN, UR
Creatinine, Ur: 194.6 mg/dL (ref 28.0–217.0)
Microalb, Ur: 26 mg/L
Microalb/Crt. Ratio: 13 mcg/mg creat (ref <25)

## 2015-02-08 NOTE — Telephone Encounter (Signed)
Pt aware of results as stated by provider

## 2015-02-08 NOTE — Telephone Encounter (Signed)
-----   Message from Michelle Piper, CNP sent at 02/08/2015 12:24 PM EST -----  Normal, repeat in 1 year

## 2015-03-31 ENCOUNTER — Inpatient Hospital Stay: Payer: MEDICARE | Primary: Family

## 2015-03-31 DIAGNOSIS — E119 Type 2 diabetes mellitus without complications: Secondary | ICD-10-CM

## 2015-03-31 DIAGNOSIS — Z794 Long term (current) use of insulin: Secondary | ICD-10-CM | POA: Diagnosis not present

## 2015-03-31 DIAGNOSIS — E039 Hypothyroidism, unspecified: Secondary | ICD-10-CM | POA: Diagnosis not present

## 2015-03-31 LAB — COMPREHENSIVE METABOLIC PANEL
ALT: 14 U/L (ref 5–33)
AST: 16 U/L (ref <32)
Albumin: 4.2 g/dL (ref 3.5–5.2)
Alkaline Phosphatase: 79 U/L (ref 35–104)
Anion Gap: 12 mmol/L (ref 9–17)
BUN: 25 mg/dL — ABNORMAL HIGH (ref 8–23)
CO2: 26 mmol/L (ref 20–31)
Calcium: 9 mg/dL (ref 8.6–10.4)
Chloride: 103 mmol/L (ref 98–107)
Creatinine: 0.85 mg/dL (ref 0.50–0.90)
GFR African American: >60 mL/min (ref >60)
GFR Non-African American: >60 mL/min (ref >60)
Glucose: 106 mg/dL — ABNORMAL HIGH (ref 70–99)
Potassium: 4.6 mmol/L (ref 3.7–5.3)
Sodium: 141 mmol/L (ref 135–144)
Total Bilirubin: 0.42 mg/dL (ref 0.3–1.2)
Total Protein: 7 g/dL (ref 6.4–8.3)

## 2015-03-31 LAB — CBC WITH AUTO DIFFERENTIAL
Absolute Eos #: 0.1 10*3/uL (ref 0.0–0.4)
Absolute Lymph #: 2.7 10*3/uL (ref 1.0–4.8)
Absolute Mono #: 0.6 10*3/uL (ref 0.1–1.3)
Basophils Absolute: 0 10*3/uL (ref 0.0–0.2)
Basophils: 1 % (ref 0–2)
Eosinophils %: 1 % (ref 0–4)
Hematocrit: 36.8 % (ref 36–46)
Hemoglobin: 12.1 g/dL (ref 12.0–16.0)
Lymphocytes: 38 % (ref 24–44)
MCH: 28.9 pg (ref 26–34)
MCHC: 33 g/dL (ref 31–37)
MCV: 87.5 fL (ref 80–100)
MPV: 9.7 fL (ref 6.0–12.0)
Monocytes: 8 % — ABNORMAL HIGH (ref 1–7)
Platelets: 202 10*3/uL (ref 150–450)
RBC: 4.2 m/uL (ref 4.0–5.2)
RDW: 13.4 % (ref 11.5–14.9)
Seg Neutrophils: 52 % (ref 36–66)
Segs Absolute: 3.7 10*3/uL (ref 1.3–9.1)
WBC: 7.2 10*3/uL (ref 3.5–11.0)

## 2015-03-31 LAB — TSH WITH REFLEX: TSH: 0.23 mIU/L — ABNORMAL LOW (ref 0.30–5.00)

## 2015-03-31 LAB — T4, FREE: Thyroxine, Free: 1.61 ng/dL (ref 0.93–1.70)

## 2015-03-31 LAB — CBC AND DIFFERENTIAL
HCT: 37 % (ref 36–46)
HEMOGLOBIN: 12.1 g/dL (ref 12.0–16.0)
PLATELETS: 202 10*3/uL (ref 150–399)
WBC: 7.2 10*3/mL

## 2015-03-31 LAB — HEPATIC FUNCTION PANEL
ALK PHOS: 79 U/L (ref 25–125)
ALT: 14 U/L (ref 7–35)
AST: 16 U/L (ref 13–35)
Bilirubin, Total: 0.4 mg/dL

## 2015-03-31 LAB — BASIC METABOLIC PANEL
BUN: 25 mg/dL — AB (ref 4–21)
CREATININE: 0.9 mg/dL (ref 0.5–1.1)
GLUCOSE: 106 mg/dL
POTASSIUM: 4.6 mmol/L (ref 3.4–5.3)
SODIUM: 141 mmol/L (ref 137–147)

## 2015-04-04 ENCOUNTER — Inpatient Hospital Stay: Payer: MEDICARE | Primary: Family

## 2015-04-04 DIAGNOSIS — E119 Type 2 diabetes mellitus without complications: Secondary | ICD-10-CM

## 2015-04-04 DIAGNOSIS — Z794 Long term (current) use of insulin: Secondary | ICD-10-CM | POA: Diagnosis not present

## 2015-04-04 LAB — LIPID PANEL
Chol/HDL Ratio: 3 (ref <5)
Cholesterol: 218 mg/dL — ABNORMAL HIGH (ref <200)
HDL: 72 mg/dL (ref >40)
LDL Cholesterol: 120 mg/dL (ref 0–130)
Triglycerides: 131 mg/dL (ref <150)

## 2015-04-05 NOTE — Telephone Encounter (Signed)
LMOM that lipid panel stable, repeat in 1 year

## 2015-04-05 NOTE — Telephone Encounter (Signed)
-----   Message from Michelle Piperiane J Kielmeyer, CNP sent at 04/05/2015  1:16 PM EDT -----  Lipid panel stable, repeat in 1 year

## 2015-04-18 ENCOUNTER — Telehealth

## 2015-04-18 MED ORDER — ACETAMINOPHEN-CODEINE #3 300-30 MG PO TABS
300-30 MG | ORAL_TABLET | Freq: Four times a day (QID) | ORAL | 0 refills | Status: DC | PRN
Start: 2015-04-18 — End: 2015-04-19

## 2015-04-18 NOTE — Telephone Encounter (Signed)
Last seen 02/07/15  Last fill 10/07/14 # 20  [facility] on your desk

## 2015-04-19 MED ORDER — ACETAMINOPHEN-CODEINE #3 300-30 MG PO TABS
300-30 MG | ORAL_TABLET | Freq: Four times a day (QID) | ORAL | 0 refills | Status: DC | PRN
Start: 2015-04-19 — End: 2015-09-12

## 2015-04-19 NOTE — Addendum Note (Signed)
Addended by: Corky SoxKIELMEYER, Chynna Buerkle on: 04/19/2015 11:44 AM     Modules accepted: Orders

## 2015-04-19 NOTE — Telephone Encounter (Signed)
Pt aware

## 2015-04-19 NOTE — Telephone Encounter (Signed)
Please let her know that [facility]

## 2015-04-19 NOTE — Telephone Encounter (Addendum)
[facility] [place], Pt is leaving for out of town tomorrow, and will not receive the mail order. Writer said she would see if this can be done.

## 2015-04-19 NOTE — Telephone Encounter (Signed)
Writer called [facility] and canceled the Tylenol #3 as requested by [employer]

## 2015-05-09 ENCOUNTER — Encounter: Attending: Family | Primary: Family

## 2015-05-18 ENCOUNTER — Encounter

## 2015-05-19 MED ORDER — LEVOTHYROXINE SODIUM 150 MCG PO TABS
150 MCG | ORAL_TABLET | ORAL | 1 refills | Status: DC
Start: 2015-05-19 — End: 2015-11-15

## 2015-06-02 NOTE — Telephone Encounter (Signed)
Telephone Outcome: Left voice message.   1ST ATTEMPT

## 2015-06-20 NOTE — Telephone Encounter (Signed)
Phone call to pt to clarify Humulin N dosing, msg left to return our call

## 2015-06-22 NOTE — Telephone Encounter (Signed)
Unable to contact pt or emergency contact,   Will send in recorded dosing as charted  Last OV 02/07/15    Health Maintenance   Topic Date Due   ??? DTaP/Tdap/Td vaccine (1 - Tdap) 09/15/1947   ??? Zostavax vaccine  09/16/1990   ??? Pneumococcal low/med risk (2 of 2 - PCV13) 01/31/2013   ??? Flu vaccine (Season Ended) 09/23/2015 (Originally 08/16/2015)   ??? DEXA (modify frequency per FRAX score)  Addressed             (applicable per patient's age: Cancer Screenings, Depression Screening, Fall Risk Screening, Immunizations)    Hemoglobin A1C (%)   Date Value   02/07/2015 7.9   10/07/2014 8.2   11/26/2013 6.2 (H)     Microalb/Crt. Ratio (mcg/mg creat)   Date Value   02/07/2015 13     LDL Cholesterol (mg/dL)   Date Value   16/10/960403/20/2017 120     AST (U/L)   Date Value   03/31/2015 16     ALT (U/L)   Date Value   03/31/2015 14     BUN (mg/dL)   Date Value   54/09/811903/16/2017 25 (H)      (goal A1C is < 7)   (goal LDL is <100) need 30-50% reduction from baseline     BP Readings from Last 3 Encounters:   02/07/15 (!) 138/58   10/07/14 152/66   09/07/14 136/60    (goal BP 120/80)      All Future Testing planned in [facility]:  Lab Frequency Next Occurrence       Next Visit Date:  Future Appointments  Date Time Provider Department Center   09/12/2015 1:00 PM Sudhakar Quinn AxeN Pang[place], MD [facility]            Patient Active Problem List:     IDDM (insulin dependent diabetes mellitus) (HCC)     Depression     Hyperlipidemia     Hypertension     Osteoarthritis     Hypothyroidism     Abdominal pain, epigastric     Gastric ulcer     CKD (chronic kidney disease) stage 3, GFR 30-59 ml/min     Oropharyngeal dysphagia     Ulcer of esophagus without bleeding     Dysphagia     Diabetes type 2, controlled (HCC)     Food impaction of esophagus     Esophagitis     Ischemic colitis (HCC)

## 2015-06-23 MED ORDER — INSULIN NPH (HUMAN) (ISOPHANE) 100 UNIT/ML SC SUSP
100 UNIT/ML | SUBCUTANEOUS | 3 refills | Status: DC
Start: 2015-06-23 — End: 2015-10-13

## 2015-06-26 ENCOUNTER — Encounter

## 2015-06-27 MED ORDER — SIMVASTATIN 40 MG PO TABS
40 MG | ORAL_TABLET | ORAL | 1 refills | Status: DC
Start: 2015-06-27 — End: 2015-10-13

## 2015-06-27 NOTE — Telephone Encounter (Signed)
Last OV 02/07/2015        Health Maintenance   Topic Date Due   ??? DTaP/Tdap/Td vaccine (1 - Tdap) 09/15/1947   ??? Zostavax vaccine  09/16/1990   ??? Pneumococcal low/med risk (2 of 2 - PCV13) 01/31/2013   ??? Flu vaccine (Season Ended) 09/23/2015 (Originally 08/16/2015)   ??? DEXA (modify frequency per FRAX score)  Addressed             (applicable per patient's age: Cancer Screenings, Depression Screening, Fall Risk Screening, Immunizations)    Hemoglobin A1C (%)   Date Value   02/07/2015 7.9   10/07/2014 8.2   11/26/2013 6.2 (H)     Microalb/Crt. Ratio (mcg/mg creat)   Date Value   02/07/2015 13     LDL Cholesterol (mg/dL)   Date Value   25/95/638703/20/2017 120     AST (U/L)   Date Value   03/31/2015 16     ALT (U/L)   Date Value   03/31/2015 14     BUN (mg/dL)   Date Value   56/43/329503/16/2017 25 (H)      (goal A1C is < 7)   (goal LDL is <100) need 30-50% reduction from baseline     BP Readings from Last 3 Encounters:   02/07/15 (!) 138/58   10/07/14 152/66   09/07/14 136/60    (goal BP 120/80)      All Future Testing planned in [facility]:  Lab Frequency Next Occurrence       Next Visit Date:  Future Appointments  Date Time Provider Department Center   09/12/2015 1:00 PM Sudhakar Quinn AxeN [place], MD [facility]            Patient Active Problem List:     IDDM (insulin dependent diabetes mellitus) (HCC)     Depression     Hyperlipidemia     Hypertension     Osteoarthritis     Hypothyroidism     Abdominal pain, epigastric     Gastric ulcer     CKD (chronic kidney disease) stage 3, GFR 30-59 ml/min     Oropharyngeal dysphagia     Ulcer of esophagus without bleeding     Dysphagia     Diabetes type 2, controlled (HCC)     Food impaction of esophagus     Esophagitis     Ischemic colitis (HCC)

## 2015-07-04 MED ORDER — AMITRIPTYLINE HCL 75 MG PO TABS
75 MG | ORAL_TABLET | ORAL | 3 refills | Status: AC
Start: 2015-07-04 — End: ?

## 2015-07-04 NOTE — Telephone Encounter (Signed)
Last OV 02/07/2015          Health Maintenance   Topic Date Due   ??? DTaP/Tdap/Td vaccine (1 - Tdap) 09/15/1947   ??? Zostavax vaccine  09/16/1990   ??? Pneumococcal low/med risk (2 of 2 - PCV13) 01/31/2013   ??? Flu vaccine (Season Ended) 09/23/2015 (Originally 08/16/2015)   ??? DEXA (modify frequency per FRAX score)  Addressed             (applicable per patient's age: Cancer Screenings, Depression Screening, Fall Risk Screening, Immunizations)    Hemoglobin A1C (%)   Date Value   02/07/2015 7.9   10/07/2014 8.2   11/26/2013 6.2 (H)     Microalb/Crt. Ratio (mcg/mg creat)   Date Value   02/07/2015 13     LDL Cholesterol (mg/dL)   Date Value   96/04/540903/20/2017 120     AST (U/L)   Date Value   03/31/2015 16     ALT (U/L)   Date Value   03/31/2015 14     BUN (mg/dL)   Date Value   81/19/147803/16/2017 25 (H)      (goal A1C is < 7)   (goal LDL is <100) need 30-50% reduction from baseline     BP Readings from Last 3 Encounters:   02/07/15 (!) 138/58   10/07/14 152/66   09/07/14 136/60    (goal BP 120/80)      All Future Testing planned in [facility]:  Lab Frequency Next Occurrence       Next Visit Date:  Future Appointments  Date Time Provider Department Center   09/12/2015 1:00 PM Sudhakar Quinn AxeN P[place], MD [facility]            Patient Active Problem List:     IDDM (insulin dependent diabetes mellitus) (HCC)     Depression     Hyperlipidemia     Hypertension     Osteoarthritis     Hypothyroidism     Abdominal pain, epigastric     Gastric ulcer     CKD (chronic kidney disease) stage 3, GFR 30-59 ml/min     Oropharyngeal dysphagia     Ulcer of esophagus without bleeding     Dysphagia     Diabetes type 2, controlled (HCC)     Food impaction of esophagus     Esophagitis     Ischemic colitis (HCC)

## 2015-08-05 ENCOUNTER — Encounter

## 2015-08-05 MED ORDER — LISINOPRIL 10 MG PO TABS
10 MG | ORAL_TABLET | ORAL | 1 refills | Status: AC
Start: 2015-08-05 — End: ?

## 2015-08-05 NOTE — Telephone Encounter (Signed)
Last OV 02/07/15    Health Maintenance   Topic Date Due   ??? DTaP/Tdap/Td vaccine (1 - Tdap) 09/15/1947   ??? Zostavax vaccine  09/16/1990   ??? Pneumococcal low/med risk (2 of 2 - PCV13) 01/31/2013   ??? Flu vaccine (1) 09/23/2015 (Originally 08/16/2015)   ??? DEXA (modify frequency per FRAX score)  Addressed             (applicable per patient's age: Cancer Screenings, Depression Screening, Fall Risk Screening, Immunizations)    Hemoglobin A1C (%)   Date Value   02/07/2015 7.9   10/07/2014 8.2   11/26/2013 6.2 (H)     Microalb/Crt. Ratio (mcg/mg creat)   Date Value   02/07/2015 13     LDL Cholesterol (mg/dL)   Date Value   92/92/4462 120     AST (U/L)   Date Value   03/31/2015 16     ALT (U/L)   Date Value   03/31/2015 14     BUN (mg/dL)   Date Value   86/38/1771 25 (H)      (goal A1C is < 7)   (goal LDL is <100) need 30-50% reduction from baseline     BP Readings from Last 3 Encounters:   02/07/15 (!) 138/58   10/07/14 152/66   09/07/14 136/60    (goal BP 120/80)      All Future Testing planned in [facility]:  Lab Frequency Next Occurrence       Next Visit Date:  Future Appointments  Date Time Provider Department Center   09/12/2015 1:00 PM Sudhakar Quinn [place], MD [facility]            Patient Active Problem List:     IDDM (insulin dependent diabetes mellitus) (HCC)     Depression     Hyperlipidemia     Hypertension     Osteoarthritis     Hypothyroidism     Abdominal pain, epigastric     Gastric ulcer     CKD (chronic kidney disease) stage 3, GFR 30-59 ml/min     Oropharyngeal dysphagia     Ulcer of esophagus without bleeding     Dysphagia     Diabetes type 2, controlled (HCC)     Food impaction of esophagus     Esophagitis     Ischemic colitis (HCC)

## 2015-08-06 DIAGNOSIS — L03032 Cellulitis of left toe: Secondary | ICD-10-CM | POA: Diagnosis not present

## 2015-08-08 ENCOUNTER — Encounter: Attending: Family | Primary: Family

## 2015-08-26 MED ORDER — INSULIN REGULAR HUMAN 100 UNIT/ML IJ SOLN
100 UNIT/ML | INTRAMUSCULAR | 2 refills | Status: DC
Start: 2015-08-26 — End: 2015-10-13

## 2015-09-09 ENCOUNTER — Ambulatory Visit: Admit: 2015-09-09 | Discharge: 2015-09-09 | Payer: MEDICARE | Attending: Internal Medicine | Primary: Family

## 2015-09-09 DIAGNOSIS — IMO0001 Reserved for inherently not codable concepts without codable children: Secondary | ICD-10-CM

## 2015-09-09 DIAGNOSIS — Z794 Long term (current) use of insulin: Secondary | ICD-10-CM | POA: Diagnosis not present

## 2015-09-09 DIAGNOSIS — I10 Essential (primary) hypertension: Secondary | ICD-10-CM | POA: Diagnosis not present

## 2015-09-09 DIAGNOSIS — E039 Hypothyroidism, unspecified: Secondary | ICD-10-CM | POA: Diagnosis not present

## 2015-09-09 DIAGNOSIS — G629 Polyneuropathy, unspecified: Secondary | ICD-10-CM | POA: Diagnosis not present

## 2015-09-09 DIAGNOSIS — E119 Type 2 diabetes mellitus without complications: Secondary | ICD-10-CM | POA: Diagnosis not present

## 2015-09-09 LAB — POCT GLYCOSYLATED HEMOGLOBIN (HGB A1C): Hemoglobin A1C: 9 %

## 2015-09-09 MED ORDER — GABAPENTIN 100 MG PO CAPS
100 MG | ORAL_CAPSULE | Freq: Every day | ORAL | 3 refills | Status: AC
Start: 2015-09-09 — End: ?

## 2015-09-12 ENCOUNTER — Ambulatory Visit: Admit: 2015-09-12 | Discharge: 2015-09-12 | Payer: MEDICARE | Attending: Gastroenterology | Primary: Family

## 2015-09-12 DIAGNOSIS — R1013 Epigastric pain: Secondary | ICD-10-CM

## 2015-09-12 DIAGNOSIS — R1314 Dysphagia, pharyngoesophageal phase: Secondary | ICD-10-CM | POA: Diagnosis not present

## 2015-09-12 NOTE — Progress Notes (Signed)
Subjective:      Patient ID: GIANELLY SIME is a 82 y.o. female.    HPI   Dr. Michelle Piper, CNP our mutual patient Catha ARYAUNA VONKAENEL was seen  for   1. Abdominal pain, epigastric    2. Esophageal dysphagia     . 3 days swallowing issues   No wit loss severala egds  Inn thw past  fb esophagius  2016  Patient seen along with her daughter.  Her chief complaint is dysphagia.  She states that she has issues were for solids and liquids.  She was doing well until Saturday that is a 8 /26 /2017./  In the last 2 days she was not able to tolerate diet.  Not able satisfactorily.  No weight loss.  She also has substernal discomfort.  No regurgitation of food.  No cough.  She is able to swallow saliva without difficulty.    She has mild epigastric discomfort.  No radiation.  No nausea.  Bowel movement satisfactory.  No melena or hematochezia.  Her previous records reviewed and she had similar symptoms in the past and workup was negative.  She has a history of esophagitis.    Past Medical, Family, and Social History reviewed and does contribute to the patient presenting condition.    patient"s PMH/PSH,SH,PSYCH hx, MEDs, ALLERGIES, and ROS was all reviewed and updated ion the appropriate sections      Review of Systems   Constitutional: Positive for appetite change (unable to swallow food or fluids at this time). Negative for activity change, chills, diaphoresis, fatigue, fever and unexpected weight change.   HENT: Positive for dental problem (upper dentures), hearing loss, postnasal drip and trouble swallowing (she cannot swallow food or fluids). Negative for congestion, drooling, ear discharge, ear pain, facial swelling, mouth sores, nosebleeds, rhinorrhea, sinus pressure, sneezing, sore throat, tinnitus and voice change.    Eyes: Positive for visual disturbance (glasses).   Respiratory: Negative.  Negative for cough, shortness of breath and wheezing.    Cardiovascular: Negative.  Negative for chest pain and leg swelling.    Gastrointestinal: Positive for abdominal pain (epigastric) and vomiting (when she swallowed coffee the morning). Negative for abdominal distention, anal bleeding, blood in stool, constipation, diarrhea, nausea and rectal pain.   Endocrine: Negative.  Negative for polyphagia and polyuria.   Genitourinary: Negative.  Negative for difficulty urinating, dysuria, vaginal bleeding and vaginal discharge.   Musculoskeletal: Positive for back pain. Negative for arthralgias and gait problem.   Skin: Negative.    Allergic/Immunologic: Negative.  Negative for environmental allergies and food allergies.   Neurological: Positive for numbness (left leg numbness). Negative for dizziness, tremors, seizures, syncope, facial asymmetry, speech difficulty, weakness, light-headedness and headaches.        In constant motion   Hematological: Negative.  Negative for adenopathy. Does not bruise/bleed easily.   Psychiatric/Behavioral: Negative.  Negative for sleep disturbance. The patient is not nervous/anxious.        Objective:   Physical Exam   Constitutional: She is oriented to person, place, and time. She appears well-developed and well-nourished.   HENT:   Head: Normocephalic and atraumatic.   No oral lesions   Eyes: Conjunctivae are normal. Pupils are equal, round, and reactive to light. No scleral icterus.   Neck: Normal range of motion. Neck supple. No hepatojugular reflux and no JVD present. No tracheal deviation present. No thyromegaly present.   Cardiovascular: Normal rate, regular rhythm, normal heart sounds and intact distal pulses.  Pulmonary/Chest: Effort normal and breath sounds normal. No respiratory distress. She has no wheezes. She has no rales.   Abdominal: Soft. Bowel sounds are normal. She exhibits no distension, no ascites and no mass. There is no hepatomegaly. There is no tenderness. There is no rebound. No hernia.   Musculoskeletal: She exhibits no edema or tenderness.   No joint swelling   Lymphadenopathy:      She has no cervical adenopathy.   Neurological: She is alert and oriented to person, place, and time. No cranial nerve deficit.   Skin: Skin is warm. No bruising, no ecchymosis and no rash noted. No erythema.   Psychiatric: Thought content normal.   Nursing note and vitals reviewed.      Assessment:       1. Abdominal pain, epigastric     2. Esophageal dysphagia  EGD           Plan:          At present patient appears stable.  Oral cavity no lesions seen.  She needs EGD to further evaluate her symptoms and this will be arranged as early as possible.  Meanwhile advised to stay on only on clear liquids.  After discussion both patient and her daughter understood and agreed.

## 2015-09-12 NOTE — Progress Notes (Signed)
[facility]  [facility]  [address]  [address]  [place] [address]  Dept: [phone]  Dept Fax: [phone]    Kristen Gill is a 82 y.o. female who presents today for her medical conditions/complaints as noted below.  Kristen Gill is c/o of   Chief Complaint   Patient presents with   ??? Immunizations     pt due for pneumonia vaccine-pt refused   ??? Fall     has nubness in legs and has falen a couple times   ??? Diabetes     check up-A1c due   ??? Hypertension     check up-   ??? Other     [facility]         HPI:     HPI     DIABETIC  Control             []   at goal .                                                                          9  [x]    Not at goal         Patient on    []  metformin    []  other --    [x]  insulin sliding scale  []  oral hypoglycemics   [x]  aspirin          [x]  L A INSULIN []  januvia       [x]  statin    [x]  acei/arb             Known diabetic complications:   peripheral neuropathy     Treatment reviewed ,    Continue present regimen as outlined .    [x]  yes     []  NO        Optimize present regimen ;  1. NPH 32 untis bid sc       []  yes     []  NO      Follows with eye doc      Liable to get low BS if she takes higher dose on NPH    Signs and symptoms of hypothyroidism  in remission   Taking synthroid on empty stomach   Last TSH with in normal range   Recommended to continue current  dose .    GERD - reflux symptoms in remission   Continue PPI   Will continue to monitor for red flag signs for any further need for UGI endoscopy   Advised to avoid alcohol intake and smoking   Educated anti reflux precautions       Hemoglobin A1C (%)   Date Value   09/09/2015 9.0   02/07/2015 7.9   10/07/2014 8.2             ( goal A1C is < 7)   Microalb/Crt. Ratio (mcg/mg creat)   Date Value   02/07/2015 13     LDL Cholesterol (mg/dL)   Date Value   04/04/2015 120   11/26/2013 65   01/26/2012 92       (goal LDL is <100)   AST (U/L)   Date Value   03/31/2015 16  ALT (U/L)   Date Value   03/31/2015 14     BUN (mg/dL)   Date Value   03/31/2015 25 (H)     BP Readings from Last 3 Encounters:   09/12/15 114/61   09/09/15 (!) 136/48   02/07/15 (!) 138/58          (goal 120/80)    Past Medical History:   Diagnosis Date   ??? Depression    ??? Esophageal dilatation    ??? Hyperlipidemia    ??? Hypertension    ??? Hypothyroidism    ??? IDDM (insulin dependent diabetes mellitus) ([place])    ??? Ischemic colitis ([place]) 2010   ??? Oropharyngeal dysphagia 06/02/2014   ??? Osteoarthritis    ??? Ulcer of esophagus without bleeding 06/02/2014      Past Surgical History:   Procedure Laterality Date   ??? CHOLECYSTECTOMY     ??? COLONOSCOPY  04/14/2008    severe ischemic colitis   ??? COLONOSCOPY  06/03/2008    redundant colon, a lot of adhesions, poor prep, diverticula   ??? HYSTERECTOMY     ??? THYROIDECTOMY     ??? UPPER GASTROINTESTINAL ENDOSCOPY  2010    dilation   ??? UPPER GASTROINTESTINAL ENDOSCOPY  10/14/2008    esophageal lesion, biopsy shows inflammatory changes   ??? UPPER GASTROINTESTINAL ENDOSCOPY  09/12/2011    small ulcer upper part of stomach, reactive gastropathy   ??? UPPER GASTROINTESTINAL ENDOSCOPY  06/02/14    with removal of retained food and biopsy   ??? UPPER GASTROINTESTINAL ENDOSCOPY  08/20/2014    ? esophageal motility problem, maloney dilation of esophagus up to 45 fr size       Family History   Problem Relation Age of Onset   ??? Other Mother      died at age 60 of old age   ??? Heart Disease Father    ??? Cancer Sister      lung   ??? Cancer Brother    ??? Birth Defects Brother      unknown site       Social History   Substance Use Topics   ??? Smoking status: Never Smoker   ??? Smokeless tobacco: Never Used   ??? Alcohol use No      Current Outpatient Prescriptions   Medication Sig Dispense Refill   ??? gabapentin (NEURONTIN) 100 MG capsule Take 1 capsule by mouth daily 270 capsule 3   ??? insulin regular (HUMULIN R) 100 UNIT/ML injection Every evening with largest meal 3 vial 2   ??? lisinopril (PRINIVIL;ZESTRIL) 10 MG tablet  TAKE 1 TABLET DAILY 90 tablet 1   ??? amitriptyline (ELAVIL) 75 MG tablet TAKE 1 TABLET TWICE A DAY 180 tablet 3   ??? simvastatin (ZOCOR) 40 MG tablet TAKE 1 TABLET AT BEDTIME 90 tablet 1   ??? insulin NPH (HUMULIN N) 100 UNIT/ML injection vial 32 units BID 15 vial 3   ??? levothyroxine (SYNTHROID) 150 MCG tablet TAKE 1 TABLET EVERY MORNING 90 tablet 1   ??? omeprazole (PRILOSEC) 20 MG delayed release capsule Take 1 capsule by mouth daily 14 capsule 0   ??? acetaminophen (TYLENOL) 160 MG/5ML solution Take 20.3 mLs by mouth every 4 hours as needed 473 mL 3   ??? Cholecalciferol (VITAMIN D) 2000 UNITS CAPS capsule Take 2,000 Units by mouth daily     ??? ondansetron (ZOFRAN-ODT) 4 MG disintegrating tablet Take 1 tablet by mouth every 8 hours as needed for Nausea or Vomiting 30 tablet  1   ??? Insulin Syringe-Needle U-100 (B-D INS SYR ULTRAFINE 1CC/30G) 30G X 1/2" 1 ML MISC Inject 1 each into the skin 2 times daily 100 each 5   ??? glucagon 1 MG injection Inject 1 mg into the skin See Admin Instructions Use for symptomatic hypoglycemia only when patient is unable to take oral food or glucose tabs 1 kit 3   ??? ascorbic acid (VITAMIN C) 500 MG tablet Take 500 mg by mouth daily.     ??? Omega-3 Fatty Acids (OMEGA-3 FISH OIL PO) Take 1,000 mg by mouth daily      ??? Cyanocobalamin ([employer] B-12 PO) Take 2 tablets by mouth daily        No current facility-administered medications for this visit.      Allergies   Allergen Reactions   ??? Pravastatin      Pt states read about and refuses to take        Health Maintenance   Topic Date Due   ??? DTaP/Tdap/Td vaccine (1 - Tdap) 09/15/1947   ??? Zostavax vaccine  09/16/1990   ??? Flu vaccine (1) 09/23/2015 (Originally 08/16/2015)   ??? Pneumococcal low/med risk (2 of 2 - PCV13) 09/08/2016 (Originally 01/31/2013)   ??? DEXA (modify frequency per FRAX score)  Addressed       Subjective:      Review of Systems   Constitutional: Negative for activity change, appetite change, fatigue, fever and unexpected weight change.   HENT:  Negative for postnasal drip, sore throat and trouble swallowing.    Eyes: Negative for discharge and redness.   Respiratory: Negative for cough and shortness of breath.    Cardiovascular: Negative for chest pain and palpitations.   Gastrointestinal: Negative for abdominal pain, nausea and vomiting.   Endocrine: Negative for cold intolerance and heat intolerance.   Genitourinary: Negative for difficulty urinating and dysuria.   Musculoskeletal: Negative for arthralgias, back pain and myalgias.   Skin: Negative for rash and wound.   Neurological: Negative for dizziness and headaches.   Hematological: Negative for adenopathy.   Psychiatric/Behavioral: Negative for behavioral problems. The patient is not nervous/anxious.        Objective:     Physical Exam   Constitutional: She is oriented to person, place, and time. She appears well-developed and well-nourished. No distress.   HENT:   Head: Normocephalic and atraumatic.   Right Ear: External ear normal.   Left Ear: External ear normal.   Nose: Nose normal.   Mouth/Throat: Oropharynx is clear and moist. No oropharyngeal exudate.   Eyes: Conjunctivae are normal. Right eye exhibits no discharge. Left eye exhibits no discharge. No scleral icterus.   Neck: Neck supple.   Cardiovascular: Normal rate, regular rhythm and normal heart sounds.    No murmur heard.  Pulmonary/Chest: Effort normal and breath sounds normal. No respiratory distress. She has no wheezes.   Abdominal: Soft. Bowel sounds are normal. She exhibits no distension. There is no tenderness.   Musculoskeletal: She exhibits no edema or tenderness.   Lymphadenopathy:     She has no cervical adenopathy.   Neurological: She is alert and oriented to person, place, and time.   Skin: Skin is warm and dry. No rash noted. She is not diaphoretic.   Psychiatric: Judgment and thought content normal.   Nursing note and vitals reviewed.  Diabetic Foot Exam was completed:  Sensation intact  Color and temp satisfactory  Pulses  present / capillary fill satisfactory   Nails trimmed, no onychomycosis  No wounds, pressure sores or rash  BP (!) 136/48   Pulse 68   Resp 16   Ht 5' 3.25" (1.607 m)   Wt 153 lb 3.2 oz (69.5 kg)   SpO2 96%   BMI 26.92 kg/m2    Assessment:      1. IDDM (insulin dependent diabetes mellitus) ([place])  POCT glycosylated hemoglobin (Hb A1C)    Craig- Carlene Coria, MD, Endocrinology [facility]   2. Peripheral polyneuropathy ([facility])  gabapentin (NEURONTIN) 100 MG capsule   3. Acquired hypothyroidism     4. Essential hypertension  controlled well - continue current meds , advised daily exercise , low salt diet and DASH diet as well .               Plan:      Return in about 3 months (around 12/10/2015), or if symptoms worsen or fail to improve, for diabetes, hypertension.  Paperwork filled   Plan to move to [place] and get into assisted care senior living place.   Low carb diet advised   Fall precautions - discussed     Orders Placed This Encounter   Procedures   ??? [place]- Kenard Gower, Genevieve Norlander, MD, Endocrinology [facility]     Referral Priority:   Routine     Referral Type:   Consult for Advice and Opinion     Referral Reason:   Specialty Services Required     Referred to Provider:   Carlene Coria, MD     Requested Specialty:   Endocrinology     Number of Visits Requested:   1   ??? POCT glycosylated hemoglobin (Hb A1C)     Orders Placed This Encounter   Medications   ??? gabapentin (NEURONTIN) 100 MG capsule     Sig: Take 1 capsule by mouth daily     Dispense:  270 capsule     Refill:  3       Patient given educational materials - see patient instructions.  Discussed use, benefit, and side effects of prescribed medications.  All patient questions answered. Pt voiced understanding. Reviewed health maintenance.  Instructed to continue current medications, diet and exercise.  Patient agreed with treatment plan. Follow up as directed.     Electronically signed by Vira Agar, MD on 09/12/2015 at 4:43 PM

## 2015-09-13 ENCOUNTER — Inpatient Hospital Stay: Payer: MEDICARE

## 2015-09-13 DIAGNOSIS — T18128A Food in esophagus causing other injury, initial encounter: Secondary | ICD-10-CM | POA: Diagnosis not present

## 2015-09-13 DIAGNOSIS — K3189 Other diseases of stomach and duodenum: Secondary | ICD-10-CM | POA: Diagnosis not present

## 2015-09-13 DIAGNOSIS — K317 Polyp of stomach and duodenum: Secondary | ICD-10-CM | POA: Diagnosis not present

## 2015-09-13 DIAGNOSIS — R1312 Dysphagia, oropharyngeal phase: Secondary | ICD-10-CM | POA: Diagnosis not present

## 2015-09-13 DIAGNOSIS — R1310 Dysphagia, unspecified: Secondary | ICD-10-CM | POA: Diagnosis not present

## 2015-09-13 DIAGNOSIS — Z794 Long term (current) use of insulin: Secondary | ICD-10-CM | POA: Diagnosis not present

## 2015-09-13 DIAGNOSIS — Z8711 Personal history of peptic ulcer disease: Secondary | ICD-10-CM | POA: Diagnosis not present

## 2015-09-13 DIAGNOSIS — K219 Gastro-esophageal reflux disease without esophagitis: Secondary | ICD-10-CM | POA: Diagnosis not present

## 2015-09-13 DIAGNOSIS — E119 Type 2 diabetes mellitus without complications: Secondary | ICD-10-CM | POA: Diagnosis not present

## 2015-09-13 MED ORDER — FENTANYL CITRATE (PF) 100 MCG/2ML IJ SOLN
100 MCG/2ML | INTRAMUSCULAR | Status: DC | PRN
Start: 2015-09-13 — End: 2015-09-13
  Administered 2015-09-13: 16:00:00 50 via INTRAVENOUS

## 2015-09-13 MED ORDER — MIDAZOLAM HCL 2 MG/2ML IJ SOLN
22 MG/ML | INTRAMUSCULAR | Status: DC | PRN
Start: 2015-09-13 — End: 2015-09-13
  Administered 2015-09-13: 16:00:00 1 via INTRAVENOUS

## 2015-09-13 MED ORDER — LACTATED RINGERS IV SOLN
INTRAVENOUS | Status: DC
Start: 2015-09-13 — End: 2015-09-13

## 2015-09-13 MED ORDER — BUTAMBEN-TETRACAINE-BENZOCAINE 2-2-14 % EX AERO
2-2-14 % | CUTANEOUS | Status: DC | PRN
Start: 2015-09-13 — End: 2015-09-13
  Administered 2015-09-13: 16:00:00 1 via TOPICAL

## 2015-09-13 MED ORDER — SODIUM CHLORIDE 0.9 % IV SOLN
0.9 % | INTRAVENOUS | Status: DC
Start: 2015-09-13 — End: 2015-09-13
  Administered 2015-09-13: 16:00:00 via INTRAVENOUS

## 2015-09-13 NOTE — Progress Notes (Signed)
BLOOD SUGAR 139

## 2015-09-13 NOTE — Op Note (Signed)
ESOPHAGOGASTRODUODENOSCOPY   ( EGD ),Foreign bodybody removal of the esophagus.  DATE OF PROCEDURE: 09/13/2015     SURGEON: Jane Canary, MD    ASSISTANT: None    PREOPERATIVE DIAGNOSIS: Dysphagia    POSTOPERATIVE DIAGNOSIS: Foreign body esophagus, polyp fundus of the stomach    OPERATION: Upper GI endoscopy with Biopsy, Foreign body removal    ANESTHESIA: Moderate Sedation     ESTIMATED BLOOD LOSS: None    COMPLICATIONS: None.     SPECIMENS:  Was Obtained: Esophagus, fundus of the stomach    HISTORY: The patient is a 82 y.o. year old female with history of above preop diagnosis.  I recommended esophagogastroduodenoscopy with possible biopsy and I explained the risk, benefits, expected outcome, and alternatives to the procedure.  Risks included but are not limited to bleeding, infection, respiratory distress, hypotension, and perforation of the esophagus, stomach, or duodenum.  Patient understands and is in agreement.    PROCEDURE: The patient was given IV conscious sedation.  The patient's SPO2 remained above 90% throughout the procedure. Cetacaine spray given.  Patient placed in left lateral position.  Olympus GIF 160 videogastroscope was inserted orally under vision into the esophagus without difficulty and advanced into the stomach then through the pylorus up to the second part of duodenum.      Findings:    Retropharyngeal area was grossly normal appearing    Esophagus: abnormal: Appears to have motility problems.    May have small sliding hiatal hernia.    There is a foreign body in the lower esophagus.  This was not causing complete obstruction.  This was grasped with a Lucina Mellow net and removed from the esophagus.  This appears to be meat piece.    No stricture seen in the esophagus.  Multiple biopsies taken from the body of the esophagus to rule out eosinophilic esophagitis.    Stomach:    Fundus and Cardia Examined in Retroflexed View: abnormal: There is 3-4 mm polyp-like lesion in the fundus of the  stomach and multiple biopsies taken from this area.    Body: normal    Antrum: normal    Duodenum:     Descending: normal    Bulb: normal    While withdrawing the scope the above findings were verified and the scope was removed.  The patient tolerated the procedure well.     Recommendations/Plan:   1. F/U Biopsies  2. F/U In Office as instructed  3. Discussed with the family    Electronically signed by Jane Canary, MD  on 09/13/2015 at 12:03 PM

## 2015-09-13 NOTE — Discharge Instructions (Addendum)
EGD DISCHARGE INSTRUCTIONS    Activity:  Rest today. No driving, operating machinery, or making any important decisions today. May resume normal activity tomorrow.    Diet:  Following EGD eat slowly, chew food well, cut food into small pieces-Avoid greasy, spicy, "crunchy" foods X 48 hours.     Call your Doctor if you have any of the following:  -Passing blood rectally or vomiting blood (it may be red or black).  -Persistent nausea/vomiting  -Severe abdominal or chest pain not relieved with passing gas  -Fever of 100 degrees or more  -Redness or swelling at the IV site  -Severe sore throat or neck pain  Sedation or General Anesthesia, Adult  Care After  Refer to this sheet in the next 24 hours. These instructions provide you with information on caring for yourself after your procedure. Your caregiver may also give you more specific instructions. Your treatment has been planned according to current medical practices, but problems sometimes occur. Call your caregiver if you have any problems or questions after your procedure.   HOME CARE INSTRUCTIONS    Do not participate in any activities that require you to be alert or coordinated. Do not:   Drive.   Swim.   Ride a bicycle.   Operate heavy machinery.   Cook.   Use power tools.   Climb ladders.   Work at [employer].   Take a bath.   Do not drink alcohol.   Do not make any important decisions or sign legal documents.   Stay with an adult.   The first meal following your procedure should be light and small. Avoid solid foods if you feel sick to your stomach (nauseous) or if you throw up (vomit).   Drink enough fluids to keep your urine clear or pale yellow.   Only take your usual medicines or new medicines if your caregiver approves them.   Only take over-the-counter or prescription medicines for pain, discomfort, or fever as directed by your caregiver.   Keep all follow-up appointments as directed by your caregiver.  SEEK IMMEDIATE MEDICAL CARE IF:     You are not feeling normal or behaving normally after 24 hours.   You have persistent nausea and vomiting.   You are unable to drink fluids or eat food.   You have difficulty urinating.   You have difficulty breathing or speaking.   You have blue or gray skin.   There is difficulty waking or you cannot be woken up.   You have heavy bleeding, redness, or a lot of swelling where the sedative or anesthesia entered your skin (intravenous site).   You have a rash.  MAKE SURE YOU:   Understand these instructions.   Will watch your condition.   Will get help right away if you are not doing well or get worse.  Document Released: 01/01/2005 Document Revised: 07/03/2011 Document Reviewed: 05/02/2011  [facility] Patient Information 2013 [place], [place].  Make food into small pieces, chew well and swallow.  Drink plenty of liquids with meals.  To see me in the office in the next 3-4 weeks.

## 2015-09-13 NOTE — Progress Notes (Signed)
Abdomen soft post procedure

## 2015-09-13 NOTE — H&P (Signed)
HISTORY and [place]       NAME:  Kristen Gill  MRN: [id number]   Date of Birth:  13-Jan-1929   Date: 09/13/2015   Age: 82 y.o.  Gender: female       COMPLAINT AND PRESENT HISTORY:             Gem is 82 y.o.,Caucasian  female, here for EGD. Patient C/O of frequent heartburn, epigastric pains, nausea and no vomiting for the last few weeks.  No diarrhea / constipation, no changes in the color, caliber or consistency of the stools. No fever or chills. She is c/o dysphagia with solids and liquids. Pt states she has 3/10 epigastric pain, sore, burning in sensation. No other physical symptoms on exam.     PAST MEDICAL HISTORY     Past Medical History:   Diagnosis Date   ??? CKD (chronic kidney disease)    ??? Depression    ??? Esophageal dilatation    ??? GERD (gastroesophageal reflux disease)    ??? Hyperlipidemia    ??? Hypertension    ??? Hypothyroidism    ??? IDDM (insulin dependent diabetes mellitus) ([place])    ??? Ischemic colitis ([place]) 2010   ??? Oropharyngeal dysphagia 06/02/2014   ??? Osteoarthritis    ??? Ulcer of esophagus without bleeding 06/02/2014     Pt denies any history of stroke, heart disease, COPD, Asthma, GERD, Cancer, Seizures, Hepatitis, TB, or Substance abuse.    SURGICAL HISTORY       Past Surgical History:   Procedure Laterality Date   ??? CHOLECYSTECTOMY     ??? COLONOSCOPY  04/14/2008    severe ischemic colitis   ??? COLONOSCOPY  06/03/2008    redundant colon, a lot of adhesions, poor prep, diverticula   ??? HYSTERECTOMY     ??? THYROIDECTOMY     ??? UPPER GASTROINTESTINAL ENDOSCOPY  2010    dilation   ??? UPPER GASTROINTESTINAL ENDOSCOPY  10/14/2008    esophageal lesion, biopsy shows inflammatory changes   ??? UPPER GASTROINTESTINAL ENDOSCOPY  09/12/2011    small ulcer upper part of stomach, reactive gastropathy   ??? UPPER GASTROINTESTINAL ENDOSCOPY  06/02/14    with removal of retained food and biopsy   ??? UPPER GASTROINTESTINAL ENDOSCOPY  08/20/2014    ? esophageal motility problem, maloney dilation of  esophagus up to 45 fr size       SOCIAL HISTORY       Social History     Social History   ??? Marital status: Widowed     Spouse name: N/A   ??? Number of children: N/A   ??? Years of education: N/A     Social History Main Topics   ??? Smoking status: Never Smoker   ??? Smokeless tobacco: Never Used   ??? Alcohol use No   ??? Drug use: No   ??? Sexual activity: Not Asked     Other Topics Concern   ??? None     Social History Narrative           REVIEW OF SYSTEMS      Allergies   Allergen Reactions   ??? Pravastatin      Pt states read about and refuses to take        No current facility-administered medications on file prior to encounter.      Current Outpatient Prescriptions on File Prior to Encounter   Medication Sig Dispense Refill   ??? gabapentin (NEURONTIN)  100 MG capsule Take 1 capsule by mouth daily 270 capsule 3   ??? insulin regular (HUMULIN R) 100 UNIT/ML injection Every evening with largest meal 3 vial 2   ??? lisinopril (PRINIVIL;ZESTRIL) 10 MG tablet TAKE 1 TABLET DAILY 90 tablet 1   ??? amitriptyline (ELAVIL) 75 MG tablet TAKE 1 TABLET TWICE A DAY 180 tablet 3   ??? simvastatin (ZOCOR) 40 MG tablet TAKE 1 TABLET AT BEDTIME 90 tablet 1   ??? insulin NPH (HUMULIN N) 100 UNIT/ML injection vial 32 units BID 15 vial 3   ??? levothyroxine (SYNTHROID) 150 MCG tablet TAKE 1 TABLET EVERY MORNING 90 tablet 1   ??? omeprazole (PRILOSEC) 20 MG delayed release capsule Take 1 capsule by mouth daily 14 capsule 0   ??? acetaminophen (TYLENOL) 160 MG/5ML solution Take 20.3 mLs by mouth every 4 hours as needed 473 mL 3   ??? Cholecalciferol (VITAMIN D) 2000 UNITS CAPS capsule Take 2,000 Units by mouth daily     ??? ondansetron (ZOFRAN-ODT) 4 MG disintegrating tablet Take 1 tablet by mouth every 8 hours as needed for Nausea or Vomiting 30 tablet 1   ??? Insulin Syringe-Needle U-100 (B-D INS SYR ULTRAFINE 1CC/30G) 30G X 1/2" 1 ML MISC Inject 1 each into the skin 2 times daily 100 each 5   ??? glucagon 1 MG injection Inject 1 mg into the skin See Admin Instructions  Use for symptomatic hypoglycemia only when patient is unable to take oral food or glucose tabs 1 kit 3   ??? ascorbic acid (VITAMIN C) 500 MG tablet Take 500 mg by mouth daily.     ??? Omega-3 Fatty Acids (OMEGA-3 FISH OIL PO) Take 1,000 mg by mouth daily      ??? Cyanocobalamin (CVS B-12 PO) Take 2 tablets by mouth daily          Negative except for what is mentioned in the HPI.     GENERAL PHYSICAL EXAM     Vitals: BP (!) 158/73   Pulse 86   Temp 97 ??F (36.1 ??C) (Oral)    Resp 16   Ht 5' 6"  (1.676 m)   Wt 153 lb (69.4 kg)   SpO2 96%   BMI 24.69 kg/m2 Body mass index is 24.69 kg/(m^2).     GENERAL APPEARANCE:   Kristen Gill is 82 y.o.,Caucasian  female, not obese, nourished, conscious, alert.  Does not appear to be distress or pain at this time.                            SKIN:  Warm, dry, no cyanosis or jaundice.              HEAD:  Normocephalic, atraumatic, no swelling or tenderness.                 EYES:  Pupils equal, reactive to light.           EARS:  No discharge, no marked hearing loss.               NOSE:  No rhinorrhea, epistaxis or septal deformity.                 THROAT:  Not congested. No ulceration bleeding or discharge.                  NECK:  No stiffness, trachea central.  CHEST:  Symmetrical and equal on expansion.                 HEART:  RRR S1 > S2. No audible murmurs or gallops.                 LUNGS:  Equal on expansion, normal breath sounds.          ABDOMEN:  Soft on palpation.  Epigastric tenderness.  BS present. No guarding or rigidity. No palpable organomegaly.           LOCOMOTOR, BACK AND SPINE:  No tenderness or deformities.                 EXTREMITIES:  Symmetrical, no pedal edema.  No calf tenderness.  No discoloration or ulcerations.    NEUROLOGIC:  The patient is conscious, alert, oriented, No apparent focal sensory or motor deficits.                      PROVISIONAL DIAGNOSES / SURGERY:      Epigastric dysphagia  EGD    Patient Active Problem List    Diagnosis Date  Noted   ??? Esophageal dysphagia 06/02/2014     Priority: High   ??? Ischemic colitis ([place])    ??? Ulcer of esophagus without bleeding 06/02/2014   ??? Dysphagia    ??? Diabetes type 2, controlled ([place])    ??? Food impaction of esophagus    ??? Esophagitis    ??? CKD (chronic kidney disease) stage 3, GFR 30-59 ml/min 02/01/2012   ??? Abdominal pain, epigastric 10/15/2011   ??? Gastric ulcer 10/15/2011   ??? IDDM (insulin dependent diabetes mellitus) ([place])    ??? Depression    ??? Hyperlipidemia    ??? Hypertension    ??? Osteoarthritis    ??? Hypothyroidism            Freda Jackson, NP on 09/13/2015 at 11:28 AM

## 2015-09-14 LAB — SURGICAL PATHOLOGY

## 2015-09-20 ENCOUNTER — Encounter

## 2015-09-20 MED ORDER — ACETAMINOPHEN-CODEINE #3 300-30 MG PO TABS
300-30 MG | ORAL_TABLET | Freq: Four times a day (QID) | ORAL | 0 refills | Status: DC | PRN
Start: 2015-09-20 — End: 2015-10-27

## 2015-09-20 NOTE — Telephone Encounter (Signed)
Tylenol #3 called to [facility] [place] #20 WITH 0 RF

## 2015-09-20 NOTE — Telephone Encounter (Signed)
Last OV 09/09/15  Dr Berlinda LastPonnathota    Health Maintenance   Topic Date Due   ??? DTaP/Tdap/Td vaccine (1 - Tdap) 09/15/1949   ??? Zostavax vaccine  09/16/1990   ??? Flu vaccine (1) 09/23/2015 (Originally 08/16/2015)   ??? Pneumococcal low/med risk (2 of 2 - PCV13) 09/08/2016 (Originally 01/31/2013)   ??? DEXA (modify frequency per FRAX score)  Addressed             (applicable per patient's age: Cancer Screenings, Depression Screening, Fall Risk Screening, Immunizations)    Hemoglobin A1C (%)   Date Value   09/09/2015 9.0   02/07/2015 7.9   10/07/2014 8.2     Microalb/Crt. Ratio (mcg/mg creat)   Date Value   02/07/2015 13     LDL Cholesterol (mg/dL)   Date Value   16/10/960403/20/2017 120     AST (U/L)   Date Value   03/31/2015 16     ALT (U/L)   Date Value   03/31/2015 14     BUN (mg/dL)   Date Value   54/09/811903/16/2017 25 (H)      (goal A1C is < 7)   (goal LDL is <100) need 30-50% reduction from baseline     BP Readings from Last 3 Encounters:   09/13/15 (!) 143/77   09/12/15 114/61   09/09/15 (!) 136/48    (goal BP 120/80)      All Future Testing planned in [facility]:  Lab Frequency Next Occurrence   EGD Once 12/13/2015       Next Visit Date:  No future appointments.         Patient Active Problem List:     IDDM (insulin dependent diabetes mellitus) (HCC)     Depression     Hyperlipidemia     Hypertension     Osteoarthritis     Hypothyroidism     Abdominal pain, epigastric     Gastric ulcer     CKD (chronic kidney disease) stage 3, GFR 30-59 ml/min     Esophageal dysphagia     Ulcer of esophagus without bleeding     Dysphagia     Diabetes type 2, controlled (HCC)     Food impaction of esophagus     Esophagitis     Ischemic colitis (HCC)

## 2015-09-21 ENCOUNTER — Encounter

## 2015-09-24 ENCOUNTER — Encounter

## 2015-09-26 MED ORDER — OMEPRAZOLE 20 MG PO CPDR
20 MG | ORAL_CAPSULE | ORAL | 3 refills | Status: DC
Start: 2015-09-26 — End: 2015-10-13

## 2015-09-26 NOTE — Telephone Encounter (Signed)
Health Maintenance   Topic Date Due   ??? DTaP/Tdap/Td vaccine (1 - Tdap) 09/15/1949   ??? Zostavax vaccine  09/16/1990   ??? Flu vaccine (1) 09/16/2015   ??? Pneumococcal low/med risk (2 of 2 - PCV13) 09/08/2016 (Originally 01/31/2013)   ??? DEXA (modify frequency per FRAX score)  Addressed             (applicable per patient's age: Cancer Screenings, Depression Screening, Fall Risk Screening, Immunizations)    Hemoglobin A1C (%)   Date Value   09/09/2015 9.0   02/07/2015 7.9   10/07/2014 8.2     Microalb/Crt. Ratio (mcg/mg creat)   Date Value   02/07/2015 13     LDL Cholesterol (mg/dL)   Date Value   16/10/960403/20/2017 120     AST (U/L)   Date Value   03/31/2015 16     ALT (U/L)   Date Value   03/31/2015 14     BUN (mg/dL)   Date Value   54/09/811903/16/2017 25 (H)      (goal A1C is < 7)   (goal LDL is <100) need 30-50% reduction from baseline     BP Readings from Last 3 Encounters:   09/13/15 (!) 143/77   09/12/15 114/61   09/09/15 (!) 136/48    (goal BP 120/80)      All Future Testing planned in [facility]:  Lab Frequency Next Occurrence       Next Visit Date:  Future Appointments  Date Time Provider Department Center   10/11/2015 3:30 PM Balinda QuailsLakshmanrao Bhandaru, MD [facility]            Patient Active Problem List:     IDDM (insulin dependent diabetes mellitus) (HCC)     Depression     Hyperlipidemia     Hypertension     Osteoarthritis     Hypothyroidism     Abdominal pain, epigastric     Gastric ulcer     CKD (chronic kidney disease) stage 3, GFR 30-59 ml/min     Esophageal dysphagia     Ulcer of esophagus without bleeding     Dysphagia     Diabetes type 2, controlled (HCC)     Food impaction of esophagus     Esophagitis     Ischemic colitis (HCC)

## 2015-10-11 ENCOUNTER — Institutional Professional Consult (permissible substitution): Admit: 2015-10-11 | Discharge: 2015-10-11 | Payer: MEDICARE | Attending: "Endocrinology | Primary: Family

## 2015-10-11 DIAGNOSIS — IMO0001 Reserved for inherently not codable concepts without codable children: Secondary | ICD-10-CM

## 2015-10-11 DIAGNOSIS — E1042 Type 1 diabetes mellitus with diabetic polyneuropathy: Secondary | ICD-10-CM | POA: Diagnosis not present

## 2015-10-11 DIAGNOSIS — E11649 Type 2 diabetes mellitus with hypoglycemia without coma: Secondary | ICD-10-CM | POA: Diagnosis not present

## 2015-10-11 DIAGNOSIS — Z794 Long term (current) use of insulin: Secondary | ICD-10-CM | POA: Diagnosis not present

## 2015-10-11 DIAGNOSIS — E039 Hypothyroidism, unspecified: Secondary | ICD-10-CM | POA: Diagnosis not present

## 2015-10-11 DIAGNOSIS — E119 Type 2 diabetes mellitus without complications: Secondary | ICD-10-CM | POA: Diagnosis not present

## 2015-10-11 MED ORDER — INSULIN DETEMIR 100 UNIT/ML SC SOLN
100 UNIT/ML | SUBCUTANEOUS | 3 refills | Status: DC
Start: 2015-10-11 — End: 2015-10-18

## 2015-10-11 MED ORDER — GLUCAGON (RDNA) 1 MG IJ KIT
1 MG | PACK | INTRAMUSCULAR | 3 refills | Status: AC
Start: 2015-10-11 — End: ?

## 2015-10-11 MED ORDER — INSULIN ASPART 100 UNIT/ML SC SOLN
100 UNIT/ML | SUBCUTANEOUS | 0 refills | Status: DC
Start: 2015-10-11 — End: 2015-10-27

## 2015-10-12 NOTE — Progress Notes (Signed)
Dr. Patricia Pesa  [facility]  [phone]  [place] [address]    [phone]  [phone]    Chief Complaint   Patient presents with   ??? Consultation     REFERRED PER DR. DIANE J KIELMEYER   ??? Diabetes         BP (!) 140/64 (Site: Right Arm, Position: Sitting, Cuff Size: Large Adult)   Pulse 84 Comment: REGULAR   Ht 5' 3.25" (1.607 m)   Wt 156 lb 6 oz (70.9 kg)   Breastfeeding? No   BMI 27.48 kg/m2     Wt Readings from Last 3 Encounters:   10/11/15 156 lb 6 oz (70.9 kg)   09/13/15 153 lb (69.4 kg)   09/12/15 153 lb 8 oz (69.6 kg)     Body mass index is 27.48 kg/(m^2).    BP Readings from Last 3 Encounters:   10/11/15 (!) 140/64   09/13/15 (!) 143/77   09/12/15 114/61       There are no preventive care reminders to display for this patient.    HPI:       HPI    82 YEAR OLD WHITE FEMALE REFERRED TO ENDOCRINE SERVICE FOR EVALUATION AND MANAGEMENT OF  DIABETES MELLITUS. PATIENT CAME WITH HER DAUGHTER . CURRENTLY THE DAUGHTER IS LIVING WITH PATIENT AND  THE PATIENT IS PLANNING TO MOVE TO [place].    ACCORDING TO PATIENT SHE HAS HAD DIABETES OVER 50 YEARS. CURRENTLY TAKING NPH INSULIN  32 UNITS ONCE ADAY IN P.M. SHE WAS SUPPOSED TO TAKE 32 UNITS BID. SHE TAKES HUMULIN R 2 TO 5 UNITS AS NEEDED IF HER SUGARS ARE HIGH.  PATIENT CHECKS HER SUGARS 2 TIMES DAILY    SHE HAS HAD 2 EPISODES OF SEVERE NOCTURNAL HYPOGLYCEMIA REQUIRING RESCUE  SQUAD ASSISTANCE. WHEN SHE WAS TAKING  NPH TWICE ALONG WITH REGULAR INSULIN. SHE USES VIAL AND SYRINGE. SHE HAS NO HYPOGLYCEMIC REACTIONS NOW. HER LAST A1C WAS  ON 09/09/15 WAS 9.0  AT PCP OFFICE.    HER EATING HABITS HAVE NOT CHANGED. HER WEIGHT HAS BEEN STABLE. APPETITE NORMAL  DENIES POLYURIA, POLYDYPSIA OR DYSURIA  HER RT EYE VISION IS POOR. SHE HAD BILATERAL CATARACT SURGERY.  SHE HAS NEUROPATHY WITH PAIN IN FEET. SHE WAS STARTED ON NEURONTIN 100 MG DAILY. SHE ALSO TAKES AMITRIPTYLINE 25 MG DAILY  SHE HAS HX OF ESOPHAGEAL  STRICTURE AND HAD REPEATED DILATATIONS LAST ONE WAS Sep 13, 2015. CURRENTLY TAKING OMEPRAZOLE. ON   NO DIZZINESS.  NO CLAUDICATION    SHE HAS HX OF HYPERTENSION  ON LISINOPRIL 10 MG DAILY    HAS HYPER CHOLESTEROLEMIA ON SIMVASTATIN 40 MG DAILY    HAS HYPOTHYROIDISM ON SYNTHROID 150 MCG DAILY    NO CHEST PAIN  NO SHORTNESS OF BREATH  BOWEL O.K. NO ABDOMINAL PAIN  NO SIGNIFICANT ARTHRITIC PAINS.    PAST HX AND SURGERIES :    S/P CHOLECYSTECTOMY  BILATERAL CATARACT SURGERY  HYSTERECTOMY FOR BENIGN CONDITION  SINUS SURGERY.    CURRENT MEDS  :      NPH INSULIN. REGULAR INSULIN, AMITRIPTYLINE , LISINOPRIL, ZOCOR, VIT D3 SUPPLEMENTS, PRILOSEC, B12 TAB FISH OIL CAP , LEVOTHYROXINE 150 MCG DAILY.    ALLERGIES : NONE TO MEDS.    FAMILY HX :    SON DIED OF TYP1 DIABETES.  2 DAUGHTERS HAVE DIABETES    SOCIAL HX :  NO SMOKING OR ALCOHOL. NO ILLICIT DRUGS.  PATIENT IS A WIDOW. HAS 3  LIVING CHILDREN      Past Medical History:   Diagnosis Date   ??? CKD (chronic kidney disease)    ??? Depression    ??? Esophageal dilatation    ??? GERD (gastroesophageal reflux disease)    ??? Hyperlipidemia    ??? Hypertension    ??? Hypothyroidism    ??? IDDM (insulin dependent diabetes mellitus) ([place])    ??? Ischemic colitis ([place]) 2010   ??? Oropharyngeal dysphagia 06/02/2014   ??? Osteoarthritis    ??? Ulcer of esophagus without bleeding 06/02/2014        Past Surgical History:   Procedure Laterality Date   ??? CHOLECYSTECTOMY     ??? COLONOSCOPY  04/14/2008    severe ischemic colitis   ??? COLONOSCOPY  06/03/2008    redundant colon, a lot of adhesions, poor prep, diverticula   ??? HYSTERECTOMY     ??? THYROIDECTOMY     ??? UPPER GASTROINTESTINAL ENDOSCOPY  2010    dilation   ??? UPPER GASTROINTESTINAL ENDOSCOPY  10/14/2008    esophageal lesion, biopsy shows inflammatory changes   ??? UPPER GASTROINTESTINAL ENDOSCOPY  09/12/2011    small ulcer upper part of stomach, reactive gastropathy   ??? UPPER GASTROINTESTINAL ENDOSCOPY  06/02/14    with removal of retained food and biopsy   ??? UPPER  GASTROINTESTINAL ENDOSCOPY  08/20/2014    ? esophageal motility problem, maloney dilation of esophagus up to 45 fr size   ??? UPPER GASTROINTESTINAL ENDOSCOPY N/A 09/13/2015    EGD FOREIGN BODY REMOVAL, biopsy x2 performed by Feliberto Gottron, MD at [place]     Family History   Problem Relation Age of Onset   ??? Other Mother      died at age 80 of old age   ??? Heart Disease Father    ??? Cancer Sister      lung   ??? Cancer Brother    ??? Birth Defects Brother      unknown site     Social History   Substance Use Topics   ??? Smoking status: Never Smoker   ??? Smokeless tobacco: Never Used   ??? Alcohol use No        Current Outpatient Prescriptions   Medication Sig Dispense Refill   ??? insulin detemir (LEVEMIR) 100 UNIT/ML injection vial 30 UNITS ONCE DAILY 1 vial 3   ??? insulin aspart (NOVOLOG) 100 UNIT/ML injection vial 3 UNITS IF SUGAR IS OVER 200 BEFORE MEAL BREAKFAST LUNCH AND DINNER 1 vial 0   ??? glucagon 1 MG injection Inject 1 mg into the skin See Admin Instructions Use for symptomatic hypoglycemia only when patient is unable to take oral food or glucose tabs 1 kit 3   ??? omeprazole (PRILOSEC) 20 MG delayed release capsule TAKE 1 CAPSULE DAILY 90 capsule 3   ??? acetaminophen-codeine (TYLENOL #3) 300-30 MG per tablet Take 1 tablet by mouth every 6 hours as needed for Pain 20 tablet 0   ??? insulin regular (HUMULIN R) 100 UNIT/ML injection Every evening with largest meal 3 vial 2   ??? lisinopril (PRINIVIL;ZESTRIL) 10 MG tablet TAKE 1 TABLET DAILY 90 tablet 1   ??? amitriptyline (ELAVIL) 75 MG tablet TAKE 1 TABLET TWICE A DAY 180 tablet 3   ??? simvastatin (ZOCOR) 40 MG tablet TAKE 1 TABLET AT BEDTIME 90 tablet 1   ??? insulin NPH (HUMULIN N) 100 UNIT/ML injection vial 32 units BID 15 vial 3   ??? levothyroxine (SYNTHROID) 150 MCG tablet TAKE 1  TABLET EVERY MORNING 90 tablet 1   ??? omeprazole (PRILOSEC) 20 MG delayed release capsule Take 1 capsule by mouth daily 14 capsule 0   ??? acetaminophen (TYLENOL) 160 MG/5ML solution Take 20.3 mLs by  mouth every 4 hours as needed 473 mL 3   ??? Cholecalciferol (VITAMIN D) 2000 UNITS CAPS capsule Take 2,000 Units by mouth daily     ??? ondansetron (ZOFRAN-ODT) 4 MG disintegrating tablet Take 1 tablet by mouth every 8 hours as needed for Nausea or Vomiting 30 tablet 1   ??? Insulin Syringe-Needle U-100 (B-D INS SYR ULTRAFINE 1CC/30G) 30G X 1/2" 1 ML MISC Inject 1 each into the skin 2 times daily 100 each 5   ??? ascorbic acid (VITAMIN C) 500 MG tablet Take 500 mg by mouth daily.     ??? Omega-3 Fatty Acids (OMEGA-3 FISH OIL PO) Take 1,000 mg by mouth daily      ??? Cyanocobalamin (CVS B-12 PO) Take 2 tablets by mouth daily      ??? gabapentin (NEURONTIN) 100 MG capsule Take 1 capsule by mouth daily 270 capsule 3     No current facility-administered medications for this visit.          Allergies   Allergen Reactions   ??? Pravastatin      Pt states read about and refuses to take        Subjective:      Review of Systems:  Head:  NO HEAD ACHES  Eyes: S/P BILATERAL CATARACT SURGERY. RT EYE VISION IS POOR.  ENT:  HAS HEARING AID. S/P SINUS SURGERY  Cardiovascular:   HX OF HYPERTENSION AND HYPER CHOLESTEROLEMIA. NO HX OF ANGINA.  Peripheral Vascular:  NO HX OF CLAUDICATION. NO EDEMA.  Respiratory:    NO COUGH OR SHORTNESS OF BREATH  Gastro - Interestinal:  ESOPHAGEAL STRICTURE DILATED MULTIPLE TIMES.. ON OMEPRAZOLE  Genito - Urinary:   NO COMPLAINTS. NOCTURIA  X 2.  GYN:  S/P HYSTERECTOMY  Rheumatological:  ARTHRITIC PAINS IN KNEES , HIPS AND LOW BACK  Neurological: NO HX OF SEIZURES, TIA OR STROKE HAS. HAS DIAB NEUROPATHY.  HYPOGLYCEMIA UNAWARENESS  Hematological: NO HX OF ANEMIA OR BLEEDING  Endocrine: HX OF DIABETES AS NOTED ABOVE. HX OF HYPOTHYROIDISM ON SYNTHROID  Dermatological: NO RASHES  Psychiatric:    NO HX OF ANXIETY OR DEPRESSION      Objective:      Physical Exam    82 YEAR OLD WHITE FEMALE OVER WEIGHT NO DISTRESS  Eyes:  PERAL. S/P BILATERAL CATARACT SURGERY  ENT: HAS HEARING AID. S/P SINUS SURGERY  Neck: NO MASSES. NO  ADENOPATHY. THYROID NOT ENLARGED.  NO CAROTID BRUIT.  Heart:  REGULAR. NO MURMUR.  Lungs: BREATHING COMFORTABLY. LUNGS CLEAR. NO WHEEZES  Abdomen:SOFT. NO TENDERNESS. NO BRUIT. LIVER AND SPLEEN NOT PALPABLE  Genital/Rectal: DEFERRED  Extremities: NO EDEMA. NO CALF TENDERNESS  Rheumatological: NO JOINT SWELLING.  Neurological/Memory: ALERT AND ORIENTED. MONOFILAMENT SENSATION DIMINISHED. REFLEXES NORMAL  Psychiatric: NORMAL MOOD AND AFFECT  Skin: NO RASHES.      Assessment:     ALL RECORDS FROM PCP REVIEWED.    LAB  FROM 03/31/15    TSH 0.23, FREE T4 1.61,   BUN 25, CREAT 4.6, LYTES 141/4.6 EGFR >60  MICRO LAB 13 MCG/MG   CHOL 218, HDL 72, LDL 120, TRIG 131  ALT 14 ALB 4.2  CBC HGB 12.1, WBC 7.2, PLAT 200, N 52  AND L 38    A1C ON 8/25/1T WAS 9.0  RANDOM SUGAR IN OFFICE TODAY WAS 128    IMPRESSION:      UNCONTROLLED DIABETES MELLITUS    HX OF  SEVERE NOCTURNAL HYPOGLYCEMIA IN PAST.    DIAB NEUROPATHY.    HYPOTHYROIDISM    HYPERTENSION.    HYPERCHOLESTEROLEMIA    HX OF ESOPHAGEAL STRICTURE WITH REPEATED DILATIONS  HX OF CHOLECYSTECTOMY. HYSTERECTOMY AND SINUS SURGERY        1. IDDM (insulin dependent diabetes mellitus) ([place])    2. Type 1 diabetes mellitus with diabetic polyneuropathy ([place])    3. Hypoglycemia associated with diabetes ([place])    4. Acquired hypothyroidism            Plan:      ALL LAB DISCUSSED.    DISCUSSED DIABETES TARGETS AND TREATMENT OPTIONS AT LENGTH    STOP NPH INSULIN AND  HUMULIN R    GIVE LEVEMIR  VIAL  30 UNITS ONCE DAILY IN A.M    NOVOLG  VIAL  3 UNITS IF PRE MEAL  SUGAR IS 200 OR OVER TID BREAKFAST, LUNCH AND DINNER    PRESCRIBED GLUCAGON FOR EMERGENCY KIT. EXPLAINED TO THE DAUGHTER    MONITOR SUGARS AC AND HS    CONTINUE LISINOPRIL. SIMVASTATIN, AMITRPTYLINE, , SYNTHROID AND OMEPRAZOLE    IF INSURANCE APPROVES WILL SWITCH TO INSULIN PEN DEVICE  LATER FOR ACCURATE DELIVERY OF INSULIN DOSE.    RETURN  1 WEEK.Marland Kitchen PATIENT TO CALL IF ANY PROBLEMS WITH NEW INSULIN          No orders of the defined  types were placed in this encounter.    Orders Placed This Encounter   Medications   ??? insulin detemir (LEVEMIR) 100 UNIT/ML injection vial     Sig: 30 UNITS ONCE DAILY     Dispense:  1 vial     Refill:  3   ??? insulin aspart (NOVOLOG) 100 UNIT/ML injection vial     Sig: 3 UNITS IF SUGAR IS OVER 200 BEFORE MEAL BREAKFAST LUNCH AND DINNER     Dispense:  1 vial     Refill:  0   ??? glucagon 1 MG injection     Sig: Inject 1 mg into the skin See Admin Instructions Use for symptomatic hypoglycemia only when patient is unable to take oral food or glucose tabs     Dispense:  1 kit     Refill:  3        Return in about 1 week (around 10/18/2015) for Follow up Uncontrolled Diabetes.         Patient given educational materials - see patient instructions.  Discussed use, benefit, and side effects of prescribed medications.  All patient questions answered.  Pt voiced understanding. Reviewed health maintenance.  Instructed to continue current medications, diet and exercise.  Patient agreed with treatment plan. Follow up as directed.     Electronically signed by Carlene Coria, MD on 10/12/15 at 1:58 PM

## 2015-10-13 ENCOUNTER — Ambulatory Visit: Admit: 2015-10-13 | Discharge: 2015-10-13 | Payer: MEDICARE | Attending: Gastroenterology | Primary: Family

## 2015-10-13 DIAGNOSIS — R1314 Dysphagia, pharyngoesophageal phase: Secondary | ICD-10-CM

## 2015-10-13 DIAGNOSIS — T18108A Unspecified foreign body in esophagus causing other injury, initial encounter: Secondary | ICD-10-CM | POA: Diagnosis not present

## 2015-10-13 DIAGNOSIS — R1313 Dysphagia, pharyngeal phase: Secondary | ICD-10-CM | POA: Diagnosis not present

## 2015-10-13 NOTE — Progress Notes (Signed)
Subjective:      Patient ID: Kristen Gill is a 82 y.o. female.    HPI  Dr. Michelle Piper, CNP our mutual patient Kristen Gill was seen  for   1. Foreign body in esophagus, initial encounter     .Patient seen along with her daughter.  Patient was seen by me with foreign body of the esophagus which was removed endoscopically.  Following that patient is doing well.  Able to tolerate diet without symptoms.    She does not have esophageal stricture.  Biopsies from the esophagus did not reveal eosinophilic esophagitis.  Biopsies from the gastric polyps benign.    At present she is swallowing without issues.  No weight loss.  Has satisfactory bowel movements.  No melena or hematochezia.            Past Medical, Family, and Social History reviewed and does contribute to the patient presenting condition.    patient"s PMH/PSH,SH,PSYCH hx, MEDs, ALLERGIES, and ROS was all reviewed and updated ion the appropriate sections    Review of Systems   Constitutional: Negative for activity change, appetite change, chills, diaphoresis, fatigue, fever and unexpected weight change.   HENT: Positive for dental problem (upper dentures), hearing loss, postnasal drip and trouble swallowing (infrequently she has some problems). Negative for congestion, drooling, ear discharge, ear pain, facial swelling, mouth sores, nosebleeds, rhinorrhea, sinus pressure, sneezing, sore throat, tinnitus and voice change.    Eyes: Positive for visual disturbance (glasses).   Respiratory: Negative.  Negative for cough, shortness of breath and wheezing.    Cardiovascular: Negative.  Negative for chest pain and leg swelling.   Gastrointestinal: Positive for vomiting (when she swallowed coffee the morning). Negative for abdominal distention, abdominal pain, anal bleeding, blood in stool, constipation, diarrhea, nausea and rectal pain.   Endocrine: Negative.  Negative for polyphagia and polyuria.   Genitourinary: Negative.  Negative for difficulty  urinating, dysuria, vaginal bleeding and vaginal discharge.   Musculoskeletal: Positive for back pain. Negative for arthralgias and gait problem.   Skin: Negative.    Allergic/Immunologic: Negative.  Negative for environmental allergies and food allergies.   Neurological: Positive for numbness (left leg numbness). Negative for dizziness, tremors, seizures, syncope, facial asymmetry, speech difficulty, weakness, light-headedness and headaches.        In constant motion   Hematological: Negative.  Negative for adenopathy. Does not bruise/bleed easily.   Psychiatric/Behavioral: Negative.  Negative for sleep disturbance. The patient is not nervous/anxious.        Objective:   Physical Exam   Constitutional: She is oriented to person, place, and time. She appears well-developed and well-nourished.   HENT:   Head: Normocephalic and atraumatic.   No oral lesions   Eyes: Conjunctivae are normal. Pupils are equal, round, and reactive to light. No scleral icterus.   Neck: Normal range of motion. Neck supple. No hepatojugular reflux and no JVD present. No tracheal deviation present. No thyromegaly present.   Cardiovascular: Normal rate, regular rhythm, normal heart sounds and intact distal pulses.    Pulmonary/Chest: Effort normal and breath sounds normal. No respiratory distress. She has no wheezes. She has no rales.   Abdominal: Soft. Bowel sounds are normal. She exhibits no distension, no ascites and no mass. There is no hepatomegaly. There is no tenderness. There is no rebound. No hernia.   Musculoskeletal: She exhibits no edema or tenderness.   No joint swelling   Lymphadenopathy:     She has no cervical adenopathy.  Neurological: She is alert and oriented to person, place, and time. No cranial nerve deficit.   Skin: Skin is warm. No bruising, no ecchymosis and no rash noted. No erythema.   Psychiatric: Thought content normal.   Nursing note and vitals reviewed.      Assessment:      1. Pharyngoesophageal dysphagia  FL  ESOPHAGRAM   2. Foreign body in esophagus, initial encounter             Plan:      Patient is advised to have esophagogram done.  To contact me regarding the results.  Also discussed with them regarding EGD and biopsy results.    Advised to make food into small pieces and chew well and swallow.  Advised to drink liquids with the food.    On further discussion, patient is going to go to [facility].  She is advised to see to find a physician there.

## 2015-10-14 ENCOUNTER — Encounter

## 2015-10-14 MED ORDER — PEN NEEDLES 32G X 4 MM MISC
3 refills | Status: DC
Start: 2015-10-14 — End: 2015-10-18

## 2015-10-14 NOTE — Telephone Encounter (Signed)
Last OV 09/09/15    Health Maintenance   Topic Date Due   ??? DTaP/Tdap/Td vaccine (1 - Tdap) 09/15/1947   ??? Zostavax vaccine  09/16/1990   ??? Flu vaccine (1) 09/16/2015   ??? Pneumococcal low/med risk (2 of 2 - PCV13) 09/08/2016 (Originally 01/31/2013)   ??? DEXA (modify frequency per FRAX score)  Addressed             (applicable per patient's age: Cancer Screenings, Depression Screening, Fall Risk Screening, Immunizations)    Hemoglobin A1C (%)   Date Value   09/09/2015 9.0   02/07/2015 7.9   10/07/2014 8.2     Microalb/Crt. Ratio (mcg/mg creat)   Date Value   02/07/2015 13     LDL Cholesterol (mg/dL)   Date Value   16/10/960403/20/2017 120     AST (U/L)   Date Value   03/31/2015 16     ALT (U/L)   Date Value   03/31/2015 14     BUN (mg/dL)   Date Value   54/09/811903/16/2017 25 (H)      (goal A1C is < 7)   (goal LDL is <100) need 30-50% reduction from baseline     BP Readings from Last 3 Encounters:   10/13/15 130/60   10/11/15 (!) 140/64   09/13/15 (!) 143/77    (goal BP 120/80)      All Future Testing planned in [facility]:  Lab Frequency Next Occurrence   FL ESOPHAGRAM Once 01/13/2016       Next Visit Date:  Future Appointments  Date Time Provider Department Center   10/18/2015 2:30 PM Balinda QuailsLakshmanrao Bhandaru, MD [facility]   10/27/2015 2:00 PM Diane Ewell PoeJ Kielmeyer, CNP [facility]            Patient Active Problem List:     IDDM (insulin dependent diabetes mellitus) (HCC)     Depression     Hyperlipidemia     Hypertension     Osteoarthritis     Hypothyroidism     Abdominal pain, epigastric     Gastric ulcer     CKD (chronic kidney disease) stage 3, GFR 30-59 ml/min     Esophageal dysphagia     Ulcer of esophagus without bleeding     Dysphagia     Diabetes type 2, controlled (HCC)     Food impaction of esophagus     Esophagitis     Ischemic colitis (HCC)     Type 1 diabetes mellitus with diabetic polyneuropathy (HCC)     Hypoglycemia associated with diabetes (HCC)     Foreign body in esophagus

## 2015-10-18 DIAGNOSIS — E119 Type 2 diabetes mellitus without complications: Secondary | ICD-10-CM | POA: Diagnosis not present

## 2015-10-18 DIAGNOSIS — Z794 Long term (current) use of insulin: Secondary | ICD-10-CM | POA: Diagnosis not present

## 2015-10-18 DIAGNOSIS — E11649 Type 2 diabetes mellitus with hypoglycemia without coma: Secondary | ICD-10-CM | POA: Diagnosis not present

## 2015-10-18 MED ORDER — INSULIN ASPART PROT & ASPART (70-30) 100 UNIT/ML SC SUPN
100 UNIT/ML | PEN_INJECTOR | SUBCUTANEOUS | 0 refills | Status: AC
Start: 2015-10-18 — End: ?

## 2015-10-18 MED ORDER — INSULIN PEN NEEDLE 31G X 8 MM MISC
11 refills | Status: DC
Start: 2015-10-18 — End: 2015-10-24

## 2015-10-18 MED ORDER — INSULIN DETEMIR 100 UNIT/ML SC SOPN
100 UNIT/ML | PEN_INJECTOR | SUBCUTANEOUS | 0 refills | Status: AC
Start: 2015-10-18 — End: ?

## 2015-10-18 NOTE — Progress Notes (Signed)
Dr. Patricia Pesa  [facility]  [phone]  [place] [address]    Kristen Gill    Kristen Gill is a 82 y.o. female who presents today follow up   Chief Complaint   Patient presents with   ??? Diabetes   ??? Medication Problem     LEVEMIR PEN   .    BP 130/64 (Site: Left Arm, Position: Sitting, Cuff Size: Large Adult)   Pulse 80 Comment: REGULAR   Ht 5' 3.25" (1.607 m)   Wt 157 lb 8 oz (71.4 kg)   Breastfeeding? No   BMI 27.68 kg/m2    Lab Results   Component Value Date    LABA1C 9.0 09/09/2015     Lab Results   Component Value Date    EAG 131 11/26/2013     Wt Readings from Last 3 Encounters:   10/18/15 157 lb 8 oz (71.4 kg)   10/13/15 154 lb 8 oz (70.1 kg)   10/11/15 156 lb 6 oz (70.9 kg)       Body mass index is 27.68 kg/(m^2).     BP Readings from Last 3 Encounters:   10/18/15 130/64   10/13/15 130/60   10/11/15 (!) 140/64       There are no preventive care reminders to display for this patient.    HPI:       HPI     PATIENT COMES IN FOR  FOLLOW UP  OF     UNCONTROLLED DIABETES.    LAST VISIT ON  10/11/15 REVIEWED.  SUGARS REVIEWED. RUNNING IN 200 250 RANGE. NO LOW SUGAR  REACTIONS. TAKING LEVEMIR   30 UNITS DAILY AND HUMALOG  3UNITS BEFORE MEALS IF SUGARS ARE OVER 200  SLIDING SCALE AND NOVLOG  THE PATIENT IS PLANNING TO MOVE TO [place]. END OF THE MONTH.  ??  PATIENT CHECKS HER SUGARS 2 TIMES DAILY    ACCORDING TO HER DAUGHTER PATIENT EATS FIRST MEAL AROUND 11 A.M AND SNACK IN THE AFTERNOON AND EVENING DINNER. NO LUNCH  DENIES POLYURIA, POLYDYPSIA OR DYSURIA  HER RT EYE VISION IS POOR. SHE HAD BILATERAL CATARACT SURGERY.  SHE HAS NEUROPATHY WITH PAIN IN FEET. SHE IS ON NEURONTIN 100 MG DAILY. SHE ALSO TAKES AMITRIPTYLINE 25 MG DAILY  SHE HAS HX OF ESOPHAGEAL STRICTURE AND HAD REPEATED DILATATIONS LAST ONE WAS Sep 13, 2015. CURRENTLY TAKING OMEPRAZOLE.  NO DIZZINESS.  NO CLAUDICATION  ??  SHE HAS HX OF HYPERTENSION  ON LISINOPRIL 10 MG  DAILY  ??  HAS HYPER CHOLESTEROLEMIA ON SIMVASTATIN 40 MG DAILY  ??  HAS HYPOTHYROIDISM ON SYNTHROID 150 MCG DAILY  ??  NO CHEST PAIN  NO SHORTNESS OF BREATH  BOWEL O.K. NO ABDOMINAL PAIN.  ??      Past Medical History:   Diagnosis Date   ??? CKD (chronic kidney disease)    ??? Depression    ??? Esophageal dilatation    ??? GERD (gastroesophageal reflux disease)    ??? Hyperlipidemia    ??? Hypertension    ??? Hypothyroidism    ??? IDDM (insulin dependent diabetes mellitus) ([place])    ??? Ischemic colitis ([place]) 2010   ??? Oropharyngeal dysphagia 06/02/2014   ??? Osteoarthritis    ??? Ulcer of esophagus without bleeding 06/02/2014        Past Surgical History:   Procedure Laterality Date   ??? CHOLECYSTECTOMY     ??? COLONOSCOPY  04/14/2008    severe  ischemic colitis   ??? COLONOSCOPY  06/03/2008    redundant colon, a lot of adhesions, poor prep, diverticula   ??? HYSTERECTOMY     ??? THYROIDECTOMY     ??? UPPER GASTROINTESTINAL ENDOSCOPY  2010    dilation   ??? UPPER GASTROINTESTINAL ENDOSCOPY  10/14/2008    esophageal lesion, biopsy shows inflammatory changes   ??? UPPER GASTROINTESTINAL ENDOSCOPY  09/12/2011    small ulcer upper part of stomach, reactive gastropathy   ??? UPPER GASTROINTESTINAL ENDOSCOPY  06/02/14    with removal of retained food and biopsy   ??? UPPER GASTROINTESTINAL ENDOSCOPY  08/20/2014    ? esophageal motility problem, maloney dilation of esophagus up to 45 fr size   ??? UPPER GASTROINTESTINAL ENDOSCOPY N/A 09/13/2015    EGD FOREIGN BODY REMOVAL, biopsy x2 performed by Feliberto Gottron, MD at [place]     Family History   Problem Relation Age of Onset   ??? Other Mother      died at age 46 of old age   ??? Heart Disease Father    ??? Cancer Sister      lung   ??? Cancer Brother    ??? Birth Defects Brother      unknown site     Social History   Substance Use Topics   ??? Smoking status: Never Smoker   ??? Smokeless tobacco: Never Used   ??? Alcohol use No        Current Outpatient Prescriptions   Medication Sig Dispense Refill   ??? insulin detemir (LEVEMIR  FLEXTOUCH) 100 UNIT/ML injection pen 40 UNITS  AT BREAKFAST 15 Pen 0   ??? insulin aspart protamine-insulin aspart (NOVOLOG MIX 70/30 FLEXPEN) (70-30) 100 UNIT/ML injection 4 TO 8 UNITS BEFORE MEALS 5 Pen 0   ??? Insulin Pen Needle (B-D ULTRAFINE III SHORT PEN) 31G X 8 MM MISC May substitute with any brand  4 NEEDLES  DAILY 100 each 11   ??? insulin aspart (NOVOLOG) 100 UNIT/ML injection vial 3 UNITS IF SUGAR IS OVER 200 BEFORE MEAL BREAKFAST LUNCH AND DINNER 1 vial 0   ??? glucagon 1 MG injection Inject 1 mg into the skin See Admin Instructions Use for symptomatic hypoglycemia only when patient is unable to take oral food or glucose tabs 1 kit 3   ??? acetaminophen-codeine (TYLENOL #3) 300-30 MG per tablet Take 1 tablet by mouth every 6 hours as needed for Pain 20 tablet 0   ??? gabapentin (NEURONTIN) 100 MG capsule Take 1 capsule by mouth daily 270 capsule 3   ??? lisinopril (PRINIVIL;ZESTRIL) 10 MG tablet TAKE 1 TABLET DAILY 90 tablet 1   ??? amitriptyline (ELAVIL) 75 MG tablet TAKE 1 TABLET TWICE A DAY 180 tablet 3   ??? levothyroxine (SYNTHROID) 150 MCG tablet TAKE 1 TABLET EVERY MORNING 90 tablet 1   ??? omeprazole (PRILOSEC) 20 MG delayed release capsule Take 1 capsule by mouth daily 14 capsule 0   ??? acetaminophen (TYLENOL) 160 MG/5ML solution Take 20.3 mLs by mouth every 4 hours as needed 473 mL 3   ??? Cholecalciferol (VITAMIN D) 2000 UNITS CAPS capsule Take 2,000 Units by mouth daily     ??? Insulin Syringe-Needle U-100 (B-D INS SYR ULTRAFINE 1CC/30G) 30G X 1/2" 1 ML MISC Inject 1 each into the skin 2 times daily 100 each 5   ??? ascorbic acid (VITAMIN C) 500 MG tablet Take 500 mg by mouth daily.     ??? Omega-3 Fatty Acids (OMEGA-3 FISH OIL  PO) Take 1,000 mg by mouth daily      ??? Cyanocobalamin ([employer] B-12 PO) Take 2 tablets by mouth daily        No current facility-administered medications for this visit.        Allergies   Allergen Reactions   ??? Pravastatin      Pt states read about and refuses to take          Subjective:       Review of Systems AS NOTED IN HPI      Objective:      Physical Exam     PATIENT IS  OVER WEIGHT,  NO DISTRESS  Eyes:  PERAL. S/P BILATERAL CATARACT SURGERY  ENT: HAS HEARING AID. S/P SINUS SURGERY  Neck: NO MASSES. NO ADENOPATHY. THYROID NOT ENLARGED.  NO CAROTID BRUIT.  Heart:  REGULAR. NO MURMUR.  Lungs: CLEAR. NO WHEEZES  Abdomen:SOFT. NO TENDERNESS. NO BRUIT. LIVER AND SPLEEN NOT PALPABLE  Extremities: NO EDEMA. NO CALF TENDERNESs  Neurological/Memory: ALERT AND ORIENTED. MONOFILAMENT SENSATION DIMINISHED. REFLEXES NORMAL    Assessment:     ??  IMPRESSION:    ??  UNCONTROLLED DIABETES MELLITUS  ??  HX OF  SEVERE NOCTURNAL HYPOGLYCEMIA IN PAST.  ??  DIAB NEUROPATHY.  ??  HYPOTHYROIDISM  ??  HYPERTENSION.  ??  HYPERCHOLESTEROLEMIA  ??  HX OF ESOPHAGEAL STRICTURE WITH REPEATED DILATIONS    HX OF CHOLECYSTECTOMY. HYSTERECTOMY AND SINUS SURGERY        1. IDDM (insulin dependent diabetes mellitus) ([place])    2. Hypoglycemia associated with diabetes ([place])          Plan:     CHANGE LEVEMIR TO 40 UNITS  FLEX PEN DAILY    NOVOLG FLEX PEN   4 UNITS PLUS SCALE PRE MEAL . PATIENT EATS 2 MEALS 11 A.M AND 6 P.M AND  AFTERNOON SNACK . SCAEL AS FOLLOWS 200- 250 1 UNIT, 251- 300 2 UNITS , 301- 350 3 UNITS AND OVER 350 4 UNIT  ??  MONITOR SUGARS AC AND HS  ??  CONTINUE LISINOPRIL. SIMVASTATIN, AMITRPTYLINE, , SYNTHROID AND OMEPRAZOLE    RETURN  10 DAYS  ( ON 11/03/15 ) .PATIENT TO CALL IF ANY PROBLEMS .  ??    No orders of the defined types were placed in this encounter.    Orders Placed This Encounter   Medications   ??? insulin detemir (LEVEMIR FLEXTOUCH) 100 UNIT/ML injection pen     Sig: 40 UNITS  AT BREAKFAST     Dispense:  15 Pen     Refill:  0   ??? insulin aspart protamine-insulin aspart (NOVOLOG MIX 70/30 FLEXPEN) (70-30) 100 UNIT/ML injection     Sig: 4 TO 8 UNITS BEFORE MEALS     Dispense:  5 Pen     Refill:  0   ??? Insulin Pen Needle (B-D ULTRAFINE III SHORT PEN) 31G X 8 MM MISC     Sig: May substitute with any brand  4 NEEDLES   DAILY     Dispense:  100 each     Refill:  11        Return for Follow up Uncontrolled Diabetes.           Patient given educational materials - see patient instructions.  Discussed use, benefit, and side effects of prescribed medications.  All patient questions answered.  Pt voiced understanding. Reviewed health maintenance.  Instructed to continue current medications, diet and exercise.  Patient agreed with treatment  plan. Follow up as directed.       Electronically signed by Carlene Coria, MD on 10/18/2015

## 2015-10-24 ENCOUNTER — Encounter

## 2015-10-24 MED ORDER — INSULIN PEN NEEDLE 31G X 8 MM MISC
3 refills | 66.00000 days | Status: AC
Start: 2015-10-24 — End: ?

## 2015-10-24 NOTE — Telephone Encounter (Signed)
Health Maintenance   Topic Date Due   ??? DTaP/Tdap/Td vaccine (1 - Tdap) 09/15/1947   ??? Zostavax vaccine  09/16/1990   ??? Flu vaccine (1) 09/16/2015   ??? Pneumococcal low/med risk (2 of 2 - PCV13) 09/08/2016 (Originally 01/31/2013)   ??? DEXA (modify frequency per FRAX score)  Addressed             (applicable per patient's age: Cancer Screenings, Depression Screening, Fall Risk Screening, Immunizations)    Hemoglobin A1C (%)   Date Value   09/09/2015 9.0   02/07/2015 7.9   10/07/2014 8.2     Microalb/Crt. Ratio (mcg/mg creat)   Date Value   02/07/2015 13     LDL Cholesterol (mg/dL)   Date Value   96/04/540903/20/2017 120     AST (U/L)   Date Value   03/31/2015 16     ALT (U/L)   Date Value   03/31/2015 14     BUN (mg/dL)   Date Value   81/19/147803/16/2017 25 (H)      (goal A1C is < 7)   (goal LDL is <100) need 30-50% reduction from baseline     BP Readings from Last 3 Encounters:   10/18/15 130/64   10/13/15 130/60   10/11/15 (!) 140/64    (goal BP 120/80)      All Future Testing planned in [facility]:  Lab Frequency Next Occurrence   FL ESOPHAGRAM Once 01/13/2016       Next Visit Date:  Future Appointments  Date Time Provider Department Center   10/25/2015 10:00 AM [facility]   10/27/2015 2:00 PM Diane Ewell PoeJ Kielmeyer, CNP [facility]            Patient Active Problem List:     IDDM (insulin dependent diabetes mellitus) (HCC)     Depression     Hyperlipidemia     Hypertension     Osteoarthritis     Hypothyroidism     Abdominal pain, epigastric     Gastric ulcer     CKD (chronic kidney disease) stage 3, GFR 30-59 ml/min     Esophageal dysphagia     Ulcer of esophagus without bleeding     Dysphagia     Diabetes type 2, controlled (HCC)     Food impaction of esophagus     Esophagitis     Ischemic colitis (HCC)     Type 1 diabetes mellitus with diabetic polyneuropathy (HCC)     Hypoglycemia associated with diabetes (HCC)     Foreign body in esophagus

## 2015-10-25 ENCOUNTER — Encounter: Attending: Family | Primary: Family

## 2015-10-25 ENCOUNTER — Inpatient Hospital Stay: Admit: 2015-10-25 | Payer: MEDICARE | Primary: Family

## 2015-10-25 DIAGNOSIS — R1314 Dysphagia, pharyngoesophageal phase: Secondary | ICD-10-CM

## 2015-10-25 MED ORDER — BARIUM SULFATE 700 MG PO TABS
700 MG | Freq: Once | ORAL | Status: AC | PRN
Start: 2015-10-25 — End: 2015-10-25
  Administered 2015-10-25: 15:00:00 1 via ORAL

## 2015-10-25 MED ORDER — BARIUM SULFATE 98 % PO SUSR
98 % | Freq: Once | ORAL | Status: AC | PRN
Start: 2015-10-25 — End: 2015-10-25
  Administered 2015-10-25: 15:00:00 140 mL via ORAL

## 2015-10-27 ENCOUNTER — Ambulatory Visit: Admit: 2015-10-27 | Discharge: 2015-10-27 | Payer: MEDICARE | Attending: Family | Primary: Family

## 2015-10-27 DIAGNOSIS — IMO0001 Reserved for inherently not codable concepts without codable children: Secondary | ICD-10-CM

## 2015-10-27 DIAGNOSIS — I10 Essential (primary) hypertension: Secondary | ICD-10-CM | POA: Diagnosis not present

## 2015-10-27 DIAGNOSIS — R1314 Dysphagia, pharyngoesophageal phase: Secondary | ICD-10-CM | POA: Diagnosis not present

## 2015-10-27 DIAGNOSIS — M150 Primary generalized (osteo)arthritis: Secondary | ICD-10-CM | POA: Diagnosis not present

## 2015-10-27 DIAGNOSIS — E785 Hyperlipidemia, unspecified: Secondary | ICD-10-CM | POA: Diagnosis not present

## 2015-10-27 DIAGNOSIS — E119 Type 2 diabetes mellitus without complications: Secondary | ICD-10-CM | POA: Diagnosis not present

## 2015-10-27 DIAGNOSIS — Z794 Long term (current) use of insulin: Secondary | ICD-10-CM | POA: Diagnosis not present

## 2015-10-27 DIAGNOSIS — R69 Illness, unspecified: Secondary | ICD-10-CM | POA: Diagnosis not present

## 2015-10-27 MED ORDER — TIZANIDINE HCL 4 MG PO CAPS
4 MG | ORAL_CAPSULE | Freq: Three times a day (TID) | ORAL | 1 refills | Status: DC
Start: 2015-10-27 — End: 2015-10-27

## 2015-10-27 MED ORDER — TIZANIDINE HCL 4 MG PO CAPS
4 MG | ORAL_CAPSULE | Freq: Three times a day (TID) | ORAL | 0 refills | Status: AC
Start: 2015-10-27 — End: ?

## 2015-10-27 MED ORDER — METAXALONE 800 MG PO TABS
800 | ORAL_TABLET | Freq: Three times a day (TID) | ORAL | 0 refills | Status: AC
Start: 2015-10-27 — End: ?

## 2015-10-27 MED ORDER — ACETAMINOPHEN-CODEINE #3 300-30 MG PO TABS
300-30 | ORAL_TABLET | Freq: Four times a day (QID) | ORAL | 0 refills | 5.00000 days | Status: AC | PRN
Start: 2015-10-27 — End: ?

## 2015-10-27 NOTE — Progress Notes (Signed)
Chronic Disease Visit Information    BP Readings from Last 3 Encounters:   10/18/15 130/64   10/13/15 130/60   10/11/15 (!) 140/64          Hemoglobin A1C (%)   Date Value   09/09/2015 9.0   02/07/2015 7.9   10/07/2014 8.2     Microalb/Crt. Ratio (mcg/mg creat)   Date Value   02/07/2015 13     LDL Cholesterol (mg/dL)   Date Value   16/10/960403/20/2017 120     HDL (mg/dL)   Date Value   54/09/811903/20/2017 72     BUN (mg/dL)   Date Value   14/78/295603/16/2017 25 (H)     CREATININE (mg/dL)   Date Value   21/30/865703/16/2017 0.85     Glucose (mg/dL)   Date Value   84/69/629503/16/2017 106 (H)            Have you changed or started any medications since your last visit including any over-the-counter medicines, vitamins, or herbal medicines? no   Are you having any side effects from any of your medications? -  no  Have you stopped taking any of your medications? Is so, why? -  no    Have you seen any other physician or provider since your last visit? Yes - Records Requested  Have you had any other diagnostic tests since your last visit? Yes - Records Obtained  Have you been seen in the emergency room and/or had an admission to a hospital since we last saw you? No  Have you had your annual diabetic retinal (eye) exam? No  Have you had your routine dental cleaning in the past 6 months? no    Have you activated your MyChart account? If not, what are your barriers? No: declined     Patient Care Team:  Michelle Piperiane J Kielmeyer, CNP as PCP - General (Family Nurse Practitioner)  Jane CanarySudhakar N Pangulur, MD as Consulting Physician (Gastroenterology)         Medical History Review  Past Medical, Family, and Social History reviewed and does contribute to the patient presenting condition    Health Maintenance   Topic Date Due   ??? DTaP/Tdap/Td vaccine (1 - Tdap) 09/15/1947   ??? Zostavax vaccine  09/16/1990   ??? Flu vaccine (1) 09/16/2015   ??? Pneumococcal low/med risk (2 of 2 - PCV13) 09/08/2016 (Originally 01/31/2013)   ??? DEXA (modify frequency per FRAX score)  Addressed

## 2015-10-27 NOTE — Telephone Encounter (Signed)
[facility] for [facility]

## 2015-10-27 NOTE — Telephone Encounter (Signed)
Pt's skelaxin is no longer being covered. Requesting a new rx for another med be sent

## 2015-10-27 NOTE — Progress Notes (Signed)
[facility] PHYSICIANS  [facility] INTERNAL MEDICINE  [address]  [address]  [place] [address]  Dept: [phone]  Dept Fax: [phone]    English Kristen Gill is a 82 y.o. female who presents today for her medical conditions/complaints as noted below.  Kristen Gill is c/o of   Chief Complaint   Patient presents with   ??? Forms     form filled out for assisted living apartments, will be moving to [place]           HPI:     Here today for evaluation for assisted living  Will be moving to [place] end of the month  Has forms that are required  She is requesting refills on her pain med to last her until she is able to find another PCP         Hemoglobin A1C (%)   Date Value   09/09/2015 9.0   02/07/2015 7.9   10/07/2014 8.2             ( goal A1C is < 7)   Microalb/Crt. Ratio (mcg/mg creat)   Date Value   02/07/2015 13     LDL Cholesterol (mg/dL)   Date Value   04/04/2015 120   11/26/2013 65   01/26/2012 92       (goal LDL is <100)   AST (U/L)   Date Value   03/31/2015 16     ALT (U/L)   Date Value   03/31/2015 14     BUN (mg/dL)   Date Value   03/31/2015 25 (H)     BP Readings from Last 3 Encounters:   10/27/15 (!) 120/50   10/18/15 130/64   10/13/15 130/60          (goal 120/80)    Past Medical History:   Diagnosis Date   ??? CKD (chronic kidney disease)    ??? Depression    ??? Esophageal dilatation    ??? GERD (gastroesophageal reflux disease)    ??? Hyperlipidemia    ??? Hypertension    ??? Hypothyroidism    ??? IDDM (insulin dependent diabetes mellitus) ([place])    ??? Ischemic colitis ([place]) 2010   ??? Oropharyngeal dysphagia 06/02/2014   ??? Osteoarthritis    ??? Ulcer of esophagus without bleeding 06/02/2014      Past Surgical History:   Procedure Laterality Date   ??? CHOLECYSTECTOMY     ??? COLONOSCOPY  04/14/2008    severe ischemic colitis   ??? COLONOSCOPY  06/03/2008    redundant colon, a lot of adhesions, poor prep, diverticula   ??? HYSTERECTOMY     ??? THYROIDECTOMY     ??? UPPER GASTROINTESTINAL ENDOSCOPY  2010    dilation    ??? UPPER GASTROINTESTINAL ENDOSCOPY  10/14/2008    esophageal lesion, biopsy shows inflammatory changes   ??? UPPER GASTROINTESTINAL ENDOSCOPY  09/12/2011    small ulcer upper part of stomach, reactive gastropathy   ??? UPPER GASTROINTESTINAL ENDOSCOPY  06/02/14    with removal of retained food and biopsy   ??? UPPER GASTROINTESTINAL ENDOSCOPY  08/20/2014    ? esophageal motility problem, maloney dilation of esophagus up to 45 fr size   ??? UPPER GASTROINTESTINAL ENDOSCOPY N/A 09/13/2015    EGD FOREIGN BODY REMOVAL, biopsy x2 performed by Feliberto Gottron, MD at [place]       Family History   Problem Relation Age of Onset   ??? Other Mother  died at age 44 of old age   ??? Heart Disease Father    ??? Cancer Sister      lung   ??? Cancer Brother    ??? Birth Defects Brother      unknown site       Social History   Substance Use Topics   ??? Smoking status: Never Smoker   ??? Smokeless tobacco: Never Used   ??? Alcohol use No      Current Outpatient Prescriptions   Medication Sig Dispense Refill   ??? acetaminophen-codeine (TYLENOL #3) 300-30 MG per tablet Take 1 tablet by mouth every 6 hours as needed for Pain 120 tablet 0   ??? metaxalone (SKELAXIN) 800 MG tablet Take 1 tablet by mouth 3 times daily 90 tablet 0   ??? Insulin Pen Needle (B-D ULTRAFINE III SHORT PEN) 31G X 8 MM MISC May substitute with any brand 4 NEEDLES  DAILY, use with Levemir flextouch and Novolog flexpen 350 each 3   ??? insulin detemir (LEVEMIR FLEXTOUCH) 100 UNIT/ML injection pen 40 UNITS  AT BREAKFAST 15 Pen 0   ??? insulin aspart protamine-insulin aspart (NOVOLOG MIX 70/30 FLEXPEN) (70-30) 100 UNIT/ML injection 4 TO 8 UNITS BEFORE MEALS 5 Pen 0   ??? glucagon 1 MG injection Inject 1 mg into the skin See Admin Instructions Use for symptomatic hypoglycemia only when patient is unable to take oral food or glucose tabs 1 kit 3   ??? gabapentin (NEURONTIN) 100 MG capsule Take 1 capsule by mouth daily 270 capsule 3   ??? lisinopril (PRINIVIL;ZESTRIL) 10 MG tablet TAKE 1 TABLET DAILY  90 tablet 1   ??? amitriptyline (ELAVIL) 75 MG tablet TAKE 1 TABLET TWICE A DAY 180 tablet 3   ??? levothyroxine (SYNTHROID) 150 MCG tablet TAKE 1 TABLET EVERY MORNING 90 tablet 1   ??? omeprazole (PRILOSEC) 20 MG delayed release capsule Take 1 capsule by mouth daily 14 capsule 0   ??? acetaminophen (TYLENOL) 160 MG/5ML solution Take 20.3 mLs by mouth every 4 hours as needed 473 mL 3   ??? Cholecalciferol (VITAMIN D) 2000 UNITS CAPS capsule Take 2,000 Units by mouth daily     ??? Insulin Syringe-Needle U-100 (B-D INS SYR ULTRAFINE 1CC/30G) 30G X 1/2" 1 ML MISC Inject 1 each into the skin 2 times daily 100 each 5   ??? ascorbic acid (VITAMIN C) 500 MG tablet Take 500 mg by mouth daily.     ??? Omega-3 Fatty Acids (OMEGA-3 FISH OIL PO) Take 1,000 mg by mouth daily      ??? Cyanocobalamin ([employer] B-12 PO) Take 2 tablets by mouth daily        No current facility-administered medications for this visit.      Allergies   Allergen Reactions   ??? Pravastatin      Pt states read about and refuses to take        Health Maintenance   Topic Date Due   ??? DTaP/Tdap/Td vaccine (1 - Tdap) 09/15/1947   ??? Pneumococcal low/med risk (2 of 2 - PCV13) 09/08/2016 (Originally 01/31/2013)   ??? Zostavax vaccine  10/26/2016 (Originally 09/16/1990)   ??? Flu vaccine (1) 10/26/2016 (Originally 09/16/2015)   ??? DEXA (modify frequency per FRAX score)  Addressed       Subjective:      Review of Systems   Constitutional: Negative for activity change, appetite change, chills, fatigue, fever and unexpected weight change.   HENT: Negative for congestion, ear pain, hearing loss, sinus  pressure, sore throat and trouble swallowing.    Eyes: Negative for visual disturbance.   Respiratory: Negative for cough, shortness of breath and wheezing.    Cardiovascular: Negative for chest pain, palpitations and leg swelling.   Gastrointestinal: Negative for abdominal pain, blood in stool, constipation, diarrhea, nausea and vomiting.   Endocrine: Negative for cold intolerance, heat intolerance,  polydipsia, polyphagia and polyuria.   Genitourinary: Negative for difficulty urinating, frequency, hematuria and urgency.   Musculoskeletal: Positive for arthralgias and myalgias.   Skin: Negative for rash.   Allergic/Immunologic: Negative for environmental allergies.   Neurological: Negative for dizziness, weakness, light-headedness and headaches.   Psychiatric/Behavioral: Negative for confusion. The patient is not nervous/anxious.        Objective:     Physical Exam   Constitutional: She is oriented to person, place, and time. She appears well-developed and well-nourished.   HENT:   Head: Normocephalic.   Eyes: Conjunctivae and EOM are normal. Pupils are equal, round, and reactive to light.   Neck: Normal range of motion.   Cardiovascular: Normal rate, regular rhythm, normal heart sounds and intact distal pulses.    No murmur heard.  Pulmonary/Chest: Effort normal and breath sounds normal. She has no wheezes.   Abdominal: Soft. Bowel sounds are normal. She exhibits no distension.   Musculoskeletal: Normal range of motion.   Neurological: She is alert and oriented to person, place, and time.   Skin: Skin is warm and dry.   Psychiatric: She has a normal mood and affect. Her behavior is normal. Judgment and thought content normal.     BP (!) 120/50 (Site: Left Arm, Position: Sitting, Cuff Size: Medium Adult)    Pulse 84    Resp 18    Ht 5' 3.25" (1.607 m)    Wt 155 lb 3.2 oz (70.4 kg)    BMI 27.28 kg/m??     Assessment:      1. IDDM (insulin dependent diabetes mellitus) ([place])     2. Primary osteoarthritis involving multiple joints  acetaminophen-codeine (TYLENOL #3) 300-30 MG per tablet   3. Depression, unspecified depression type     4. Esophageal dysphagia     5. Essential hypertension     6. Hyperlipidemia, unspecified hyperlipidemia type               Plan:      Return if symptoms worsen or fail to improve.      Orders Placed This Encounter   Medications   ??? acetaminophen-codeine (TYLENOL #3) 300-30 MG per tablet      Sig: Take 1 tablet by mouth every 6 hours as needed for Pain     Dispense:  120 tablet     Refill:  0   ??? metaxalone (SKELAXIN) 800 MG tablet     Sig: Take 1 tablet by mouth 3 times daily     Dispense:  90 tablet     Refill:  0      Diabetes-stable, cont current dose of insulin until establishes new provider in [place]  Arthritis-refill on tylenol #3, informed will not be able to refill any further once she moves to [place]  Depression, dysphagia, HTN, HLD- chronic conditions all stable, continue current meds.  Form completed for assisted living, provided with copies of medication list as well as recent labs from March.  Patient and family aware may contact our office in future if her new PCP needs anything further   Patient given educational materials - see patient instructions.  Discussed use, benefit, and side effects of prescribed medications.  All patient questions answered. Pt voiced understanding. Reviewed health maintenance.  Instructed to continue current medications, diet and exercise.  Patient agreed with treatment plan. Follow up as directed.     Electronically signed by Jefferey Pica, CNP on 10/27/2015 at 4:54 PM

## 2015-10-31 ENCOUNTER — Encounter: Admit: 2015-10-31 | Discharge: 2015-10-31 | Payer: MEDICARE | Primary: Family

## 2015-10-31 DIAGNOSIS — Z111 Encounter for screening for respiratory tuberculosis: Secondary | ICD-10-CM

## 2015-10-31 NOTE — Progress Notes (Signed)
Mantoux test given right inner forearm- nurse visit only

## 2015-11-02 ENCOUNTER — Encounter: Attending: "Endocrinology | Primary: Family

## 2015-11-02 ENCOUNTER — Encounter: Admit: 2015-11-02 | Discharge: 2015-11-02 | Payer: MEDICARE | Primary: Family

## 2015-11-02 DIAGNOSIS — Z111 Encounter for screening for respiratory tuberculosis: Secondary | ICD-10-CM

## 2015-11-02 NOTE — Progress Notes (Signed)
Mantoux test right inner forearm reads negative, 0 mm induration

## 2015-11-03 NOTE — Telephone Encounter (Signed)
Discuss with Dr Murtis SinkPangulur and he agrees with writers has informed them.  Call patients daughter, she did pick up the report and the note from writer, for patient to chew food well, cut food into small pieces and eat slowly.  Cherylann RatelJanis states that she called her sister in [place] who is looking for a doctor down there already.

## 2015-11-03 NOTE — Telephone Encounter (Signed)
Kristen Gill patients daughter calls, she is given the result of the esophagram done.  She is informed that it is abnormal and explain the result.  Will discuss with Dr Murtis SinkPangulur and call her back.  She states that [place] is [place] tomorrow.  She will come to the office today for a copy of this report and see a GI doctor there.  Stress the importance of this.  She is informed that Clinical research associatewriter will call her back today.

## 2015-11-15 ENCOUNTER — Encounter

## 2015-11-15 MED ORDER — LEVOTHYROXINE SODIUM 150 MCG PO TABS
150 MCG | ORAL_TABLET | ORAL | 0 refills | Status: AC
Start: 2015-11-15 — End: ?

## 2015-11-15 NOTE — Telephone Encounter (Signed)
Health Maintenance   Topic Date Due   ??? DTaP/Tdap/Td vaccine (1 - Tdap) 09/15/1947   ??? Pneumococcal low/med risk (2 of 2 - PCV13) 09/08/2016 (Originally 01/31/2013)   ??? Zostavax vaccine  10/26/2016 (Originally 09/16/1990)   ??? Flu vaccine (1) 10/26/2016 (Originally 09/16/2015)   ??? DEXA (modify frequency per FRAX score)  Addressed             (applicable per patient's age: Cancer Screenings, Depression Screening, Fall Risk Screening, Immunizations)    Hemoglobin A1C (%)   Date Value   09/09/2015 9.0   02/07/2015 7.9   10/07/2014 8.2     Microalb/Crt. Ratio (mcg/mg creat)   Date Value   02/07/2015 13     LDL Cholesterol (mg/dL)   Date Value   16/10/960403/20/2017 120     AST (U/L)   Date Value   03/31/2015 16     ALT (U/L)   Date Value   03/31/2015 14     BUN (mg/dL)   Date Value   54/09/811903/16/2017 25 (H)      (goal A1C is < 7)   (goal LDL is <100) need 30-50% reduction from baseline     BP Readings from Last 3 Encounters:   10/27/15 (!) 120/50   10/18/15 130/64   10/13/15 130/60    (goal BP 120/80)      All Future Testing planned in [facility]:  Lab Frequency Next Occurrence       Next Visit Date:  No future appointments.         Patient Active Problem List:     IDDM (insulin dependent diabetes mellitus) (HCC)     Depression     Hyperlipidemia     Hypertension     Osteoarthritis     Hypothyroidism     Abdominal pain, epigastric     Gastric ulcer     CKD (chronic kidney disease) stage 3, GFR 30-59 ml/min     Esophageal dysphagia     Ulcer of esophagus without bleeding     Dysphagia     Diabetes type 2, controlled (HCC)     Food impaction of esophagus     Esophagitis     Ischemic colitis (HCC)     Type 1 diabetes mellitus with diabetic polyneuropathy (HCC)     Hypoglycemia associated with diabetes (HCC)     Foreign body in esophagus

## 2015-12-21 ENCOUNTER — Encounter: Payer: Self-pay | Admitting: Gastroenterology

## 2015-12-21 ENCOUNTER — Ambulatory Visit (INDEPENDENT_AMBULATORY_CARE_PROVIDER_SITE_OTHER): Payer: Medicare HMO | Admitting: Gastroenterology

## 2015-12-21 ENCOUNTER — Encounter (INDEPENDENT_AMBULATORY_CARE_PROVIDER_SITE_OTHER): Payer: Self-pay

## 2015-12-21 VITALS — BP 122/58 | HR 71 | Ht 66.0 in | Wt 160.0 lb

## 2015-12-21 DIAGNOSIS — R1319 Other dysphagia: Secondary | ICD-10-CM | POA: Diagnosis not present

## 2015-12-21 DIAGNOSIS — K224 Dyskinesia of esophagus: Secondary | ICD-10-CM

## 2015-12-21 MED ORDER — OMEPRAZOLE 40 MG PO CPDR: 40.0000 mg | DELAYED_RELEASE_CAPSULE | Freq: Every day | ORAL | 3 refills | Status: DC

## 2015-12-21 NOTE — Patient Instructions (Signed)
We have sent the following prescriptions to your mail in pharmacy: omeprazole 40 mg daily.    If you have not heard from your mail in pharmacy within 1 week or if you have not received your medication in the mail, please contact us at [phone] so we may find out why.

## 2015-12-21 NOTE — Progress Notes (Signed)
12/21/2015 Amy Calhoun [id number] 03/04/1928   HISTORY OF PRESENT ILLNESS:  This is an 82 year old female who is new to our practice.  Recently moved here from [place] to live with her daughter.  She has dysphagia, ongoing/recurrent for years. Has severe dysmotility seen on recent esophagram but it also mention of a smooth narrowing of the esophageal lumen at the GE junction. She is status post EGD with dilation in September, symptoms recurrent at least by October, which is when the esophagram was performed. We do not have any other GI records for her at this time (except for the esophagram).  She describes mostly issues with swallowing meat. Has occurred with other things such as oatmeal, etc., however, as well. A lot of times she has to vomit and bring it back up.  She is on omeprazole 20 mg daily.  Her daughter reports that it she takes a dose of mylanta or maalox before eating that it seems to help things go down better.  Past Medical History:  Diagnosis Date  . Depression   . Diabetes mellitus type 1 (HCC)   . Esophageal dysphagia   . Gastric ulcer   . Hyperlipidemia   . Hypertension   . Hypoglycemia   . Hypothyroidism   . IDDM (insulin dependent diabetes mellitus) (HCC)   . Ischemic colitis (HCC)   . Osteoarthritis   . Ulcer of esophagus    Past Surgical History:  Procedure Laterality Date  . ABDOMINAL HYSTERECTOMY    . GALLBLADDER SURGERY      has no tobacco, alcohol, and drug history on file. family history includes Diabetes in her mother; Heart disease in her father. Allergies  Allergen Reactions  . Pravastatin       Outpatient Encounter Prescriptions as of 12/21/2015  Medication Sig  . acetaminophen (TYLENOL) 160 MG/5ML elixir Take 15 mg/kg by mouth every 4 (four) hours as needed for fever.  Marland Kitchen. acetaminophen-codeine (TYLENOL #3) 300-30 MG tablet Take 1 tablet by mouth every 6 (six) hours as needed for moderate pain.  Marland Kitchen. amitriptyline (ELAVIL) 75 MG tablet Take 75  mg by mouth at bedtime as needed for sleep.  . Cholecalciferol (VITAMIN D) 2000 units CAPS Take by mouth daily.  Marland Kitchen. gabapentin (NEURONTIN) 100 MG capsule Take 100 mg by mouth 3 (three) times daily.  . insulin NPH Human (HUMULIN N,NOVOLIN N) 100 UNIT/ML injection Inject 32 Units into the skin 2 (two) times daily before a meal.  . insulin regular (NOVOLIN R,HUMULIN R) 250 units/2.125mL (100 units/mL) injection Inject into the skin 3 (three) times daily before meals.  Marland Kitchen. levothyroxine (SYNTHROID, LEVOTHROID) 150 MCG tablet Take 150 mcg by mouth daily before breakfast.  . lisinopril (PRINIVIL,ZESTRIL) 10 MG tablet Take 10 mg by mouth daily.  . Omega-3 1000 MG CAPS Take by mouth.  . ondansetron (ZOFRAN-ODT) 4 MG disintegrating tablet Take 4 mg by mouth every 8 (eight) hours as needed for nausea or vomiting.  . simvastatin (ZOCOR) 40 MG tablet Take 40 mg by mouth daily.  . vitamin B-12 (CYANOCOBALAMIN) 1000 MCG tablet Take 1,000 mcg by mouth 2 (two) times daily after a meal.  . vitamin C (ASCORBIC ACID) 500 MG tablet Take 500 mg by mouth daily.  . [DISCONTINUED] omeprazole (PRILOSEC) 20 MG capsule Take 20 mg by mouth daily.  Marland Kitchen. omeprazole (PRILOSEC) 40 MG capsule Take 1 capsule (40 mg total) by mouth daily.   No facility-administered encounter medications on file as of 12/21/2015.  REVIEW OF SYSTEMS  : All other systems reviewed and negative except where noted in the History of Present Illness.   PHYSICAL EXAM: BP (!) 122/58   Pulse 71   Ht 5\' 6"  (1.676 m)   Wt 160 lb (72.6 kg)   SpO2 98%   BMI 25.82 kg/m  General: Well developed white female in no acute distress Head: Normocephalic and atraumatic Eyes:  Sclerae anicteric, conjunctiva pink. Ears: HOH. Lungs: Clear throughout to auscultation Heart: Regular rate and rhythm Abdomen: Soft, non-distended.  BS present.  Non-tender. Musculoskeletal: Symmetrical with no gross deformities  Skin: No lesions on visible extremities Extremities: No  edema  Neurological: Alert oriented x 4, grossly non-focal Psychological:  Alert and cooperative. Normal mood and affect  ASSESSMENT AND PLAN: -82 year old female with dysphagia, ongoing/recurrent for years. Has severe dysmotility seen on recent esophagram but also mention of a smooth narrowing of the esophageal lumen at the GE junction. She is status post EGD with dilation in September, symptoms recurrent at least by October, which is when the esophagram was performed. Unsure that repeat EGD with dilation will be necessary. A lot of her symptoms may be becoming from this severe dysmotility, possibly presbyesophagus. She could also have achalasia. I discussed with Dr. Christella HartiganJacobs. We will obtain her records from [place], particularly her most recent endoscopy records at which point we will make further decisions regarding repeat EGD with dilation, esophageal manometry, versus just continue dietary modification. I discussed dietary modification again today with the patient and her daughter.  I am going to increase her PPI to omeprazole 40 mg daily for now as well.  CC:  No ref. provider found

## 2015-12-22 NOTE — Progress Notes (Signed)
I agree with the above note, plan 

## 2016-01-18 ENCOUNTER — Ambulatory Visit: Payer: 59 | Admitting: Family Medicine

## 2016-01-19 ENCOUNTER — Ambulatory Visit (INDEPENDENT_AMBULATORY_CARE_PROVIDER_SITE_OTHER): Payer: Medicare HMO | Admitting: Family Medicine

## 2016-01-19 ENCOUNTER — Telehealth: Payer: Self-pay | Admitting: *Deleted

## 2016-01-19 ENCOUNTER — Encounter: Payer: Self-pay | Admitting: Family Medicine

## 2016-01-19 VITALS — BP 142/72 | HR 76 | Temp 97.7°F | Resp 14 | Ht 64.0 in | Wt 158.0 lb

## 2016-01-19 DIAGNOSIS — E785 Hyperlipidemia, unspecified: Secondary | ICD-10-CM | POA: Diagnosis not present

## 2016-01-19 DIAGNOSIS — E1169 Type 2 diabetes mellitus with other specified complication: Secondary | ICD-10-CM | POA: Insufficient documentation

## 2016-01-19 DIAGNOSIS — I10 Essential (primary) hypertension: Secondary | ICD-10-CM | POA: Insufficient documentation

## 2016-01-19 DIAGNOSIS — E039 Hypothyroidism, unspecified: Secondary | ICD-10-CM | POA: Insufficient documentation

## 2016-01-19 DIAGNOSIS — G8929 Other chronic pain: Secondary | ICD-10-CM

## 2016-01-19 DIAGNOSIS — Z79899 Other long term (current) drug therapy: Secondary | ICD-10-CM | POA: Diagnosis not present

## 2016-01-19 DIAGNOSIS — R69 Illness, unspecified: Secondary | ICD-10-CM | POA: Diagnosis not present

## 2016-01-19 DIAGNOSIS — E1142 Type 2 diabetes mellitus with diabetic polyneuropathy: Secondary | ICD-10-CM

## 2016-01-19 DIAGNOSIS — IMO0002 Reserved for concepts with insufficient information to code with codable children: Secondary | ICD-10-CM | POA: Insufficient documentation

## 2016-01-19 DIAGNOSIS — E1129 Type 2 diabetes mellitus with other diabetic kidney complication: Secondary | ICD-10-CM | POA: Insufficient documentation

## 2016-01-19 MED ORDER — ACETAMINOPHEN-CODEINE #3 300-30 MG PO TABS: 1.0000 | ORAL_TABLET | Freq: Four times a day (QID) | ORAL | 0 refills | Status: DC | PRN

## 2016-01-19 MED ORDER — LISINOPRIL 10 MG PO TABS
10.0000 mg | ORAL_TABLET | Freq: Every day | ORAL | 3 refills | Status: DC
Start: 2016-01-19 — End: 2017-04-02

## 2016-01-19 MED ORDER — GABAPENTIN 100 MG PO CAPS: 100.0000 mg | ORAL_CAPSULE | Freq: Three times a day (TID) | ORAL | 1 refills | Status: DC

## 2016-01-19 MED ORDER — GLUCOSE BLOOD VI STRP: ORAL_STRIP | 0 refills | Status: DC

## 2016-01-19 MED ORDER — GLUCOSE BLOOD VI STRP: ORAL_STRIP | 12 refills | Status: DC

## 2016-01-19 MED ORDER — INSULIN NPH (HUMAN) (ISOPHANE) 100 UNIT/ML ~~LOC~~ SUSP: SUBCUTANEOUS | 6 refills | Status: DC

## 2016-01-19 MED ORDER — OMEPRAZOLE 40 MG PO CPDR: 40.0000 mg | DELAYED_RELEASE_CAPSULE | Freq: Every day | ORAL | 3 refills | Status: DC

## 2016-01-19 MED ORDER — AMITRIPTYLINE HCL 75 MG PO TABS: 75.0000 mg | ORAL_TABLET | Freq: Every evening | ORAL | 0 refills | Status: DC | PRN

## 2016-01-19 NOTE — Assessment & Plan Note (Signed)
Continuing current meds.

## 2016-01-19 NOTE — Progress Notes (Addendum)
Subjective:  Patient ID: Amy Calhoun, female    DOB: 17-May-1928  Age: 83 y.o. MRN: [id number]  CC: Establish care  HPI Amy Calhoun is a 83 y.o. female presents to the clinic today to establish care.  HTN  Has been stable per report.  Daughter Endorses compliance with her medications excluding her insulin.  She is currently on lisinopril 10 mg daily.  BP reasonably well controlled today given advanced age.  HLD  Unsure of control.  Needs labs today.  Compliant with Simvastatin.  DM-2  Uncontrolled.  Her daughter states that she's been an insulin-dependent diabetic for nearly 40 years.  She has her blood sugar log with her today and her sugars are labile and uncontrolled. Ranging from the 70s to nearly 400.  Her daughter states that she's not taking her insulin regularly.  She is currently on Novolin N and regular insulin.  She has been taking Novolin N 30 units daily intermittently. She is not taking regular insulin consistently.  She states that her last A1c was either 9 or 11.  Her daughter states that she has been on other insulins in the past and they have "not work". She states that they did not control her blood sugar well.  She has seen an endocrinologist in the past as well.  Hypothyroidism  Her last TSH was suppressed.  Needs labs today.  Arthritis and neuropathy  Patient is currently taking Elavil and gabapentin for neuropathy. She's also been prescribed Tylenol 3 for associated pain.  She needs a refill on the Tylenol 3.  PMH, Surgical Hx, Family Hx, Social History reviewed and updated as below.  Past Medical History:  Diagnosis Date  . Depression   . Esophageal dysphagia   . Gastric ulcer   . Hyperlipidemia   . Hypertension   . Hypothyroidism   . IDDM (insulin dependent diabetes mellitus) (HCC)   . Ischemic colitis (HCC)   . Osteoarthritis   . Ulcer of esophagus    Past Surgical History:  Procedure Laterality Date  .  ABDOMINAL HYSTERECTOMY    . GALLBLADDER SURGERY    . THYROIDECTOMY     Family History  Problem Relation Age of Onset  . Diabetes Mother   . Arthritis Mother   . Hyperlipidemia Mother   . Mental illness Mother   . Heart disease Father   . Mental illness Sister   . Arthritis Maternal Grandmother   . Arthritis Maternal Grandfather    Social History  Substance Use Topics  . Smoking status: Never Smoker  . Smokeless tobacco: Never Used  . Alcohol use No   Review of Systems  HENT: Positive for tinnitus and trouble swallowing.   Musculoskeletal: Positive for arthralgias.  Neurological: Positive for weakness and numbness.  Psychiatric/Behavioral:       Sadness.  All other systems reviewed and are negative.  Objective:   Today's Vitals: BP (!) 142/72   Pulse 76   Temp 97.7 F (36.5 C) (Oral)   Resp 14   Ht 5\' 4"  (1.626 m)   Wt 158 lb (71.7 kg)   SpO2 95%   BMI 27.12 kg/m   Physical Exam  Constitutional:  Elderly female in no acute distress.  HENT:  Head: Normocephalic and atraumatic.  Very dry mucous membranes.  Eyes: Conjunctivae are normal.  Neck: Neck supple.  Cardiovascular: Normal rate and regular rhythm.   Pulmonary/Chest: Effort normal and breath sounds normal.  Abdominal: Soft. She exhibits no distension. There is no tenderness.  There is no rebound and no guarding.  Neurological: She is alert.  Skin: No rash noted.  Psychiatric: She has a normal mood and affect.  Vitals reviewed.  Assessment & Plan:   Problem List Items Addressed This Visit    Hypothyroidism    Labs today. Continue current dose of synthroid while awaiting TSH.      Relevant Orders   TSH (Completed)   Hyperlipidemia    Unsure of control. Obtaining labs. Continue simvastatin.      Relevant Medications   lisinopril (PRINIVIL,ZESTRIL) 10 MG tablet   Other Relevant Orders   Lipid panel (Completed)   Essential hypertension - Primary    Fairly well controlled given advanced age  and comorbidities. Continue lisinopril.       Relevant Medications   lisinopril (PRINIVIL,ZESTRIL) 10 MG tablet   DM type 2 with diabetic peripheral neuropathy (HCC)    Uncontrolled. A1C today. Advised to take 30 units NPH daily (likely needs twice daily dosing; has had trouble with hypoglycemia and compliance). Will likely refer to and given long-standing history of insulin dependence as well as poor insight and reported failed trials of lantus/levemir. Recommending seeing our pharmacist, Hazle NordmannKelsy Combs in follow up.      Relevant Medications   amitriptyline (ELAVIL) 75 MG tablet   gabapentin (NEURONTIN) 100 MG capsule   insulin NPH Human (HUMULIN N,NOVOLIN N) 100 UNIT/ML injection   lisinopril (PRINIVIL,ZESTRIL) 10 MG tablet   Other Relevant Orders   Hemoglobin A1c (Completed)   Comprehensive metabolic panel (Completed)   Chronic pain    Continuing current meds.      Relevant Medications   acetaminophen-codeine (TYLENOL #3) 300-30 MG tablet   amitriptyline (ELAVIL) 75 MG tablet   gabapentin (NEURONTIN) 100 MG capsule    Other Visit Diagnoses    Encounter for long-term (current) use of high-risk medication       Relevant Orders   CBC (Completed)      Outpatient Encounter Prescriptions as of 01/19/2016  Medication Sig  . acetaminophen-codeine (TYLENOL #3) 300-30 MG tablet Take 1 tablet by mouth every 6 (six) hours as needed for moderate pain.  Marland Kitchen. amitriptyline (ELAVIL) 75 MG tablet Take 1 tablet (75 mg total) by mouth at bedtime as needed for sleep.  . Cholecalciferol (VITAMIN D) 2000 units CAPS Take by mouth daily.  Marland Kitchen. gabapentin (NEURONTIN) 100 MG capsule Take 1 capsule (100 mg total) by mouth 3 (three) times daily.  . insulin NPH Human (HUMULIN N,NOVOLIN N) 100 UNIT/ML injection 30 units daily.  . insulin regular (NOVOLIN R,HUMULIN R) 250 units/2.565mL (100 units/mL) injection Inject into the skin 3 (three) times daily before meals.  Marland Kitchen. lisinopril (PRINIVIL,ZESTRIL) 10 MG  tablet Take 1 tablet (10 mg total) by mouth daily.  . Omega-3 1000 MG CAPS Take by mouth.  Marland Kitchen. omeprazole (PRILOSEC) 40 MG capsule Take 1 capsule (40 mg total) by mouth daily.  . simvastatin (ZOCOR) 40 MG tablet Take 40 mg by mouth daily.  . vitamin B-12 (CYANOCOBALAMIN) 1000 MCG tablet Take 1,000 mcg by mouth 2 (two) times daily after a meal.  . vitamin C (ASCORBIC ACID) 500 MG tablet Take 500 mg by mouth daily.  . [DISCONTINUED] acetaminophen (TYLENOL) 160 MG/5ML elixir Take 15 mg/kg by mouth every 4 (four) hours as needed for fever.  . [DISCONTINUED] acetaminophen-codeine (TYLENOL #3) 300-30 MG tablet Take 1 tablet by mouth every 6 (six) hours as needed for moderate pain.  . [DISCONTINUED] amitriptyline (ELAVIL) 75 MG tablet Take 75 mg by  mouth at bedtime as needed for sleep. Pt takes the two tablets at night.  . [DISCONTINUED] gabapentin (NEURONTIN) 100 MG capsule Take 100 mg by mouth 3 (three) times daily.  . [DISCONTINUED] insulin NPH Human (HUMULIN N,NOVOLIN N) 100 UNIT/ML injection Inject 32 Units into the skin 2 (two) times daily before a meal.  . [DISCONTINUED] levothyroxine (SYNTHROID, LEVOTHROID) 150 MCG tablet Take 150 mcg by mouth daily before breakfast.  . [DISCONTINUED] lisinopril (PRINIVIL,ZESTRIL) 10 MG tablet Take 10 mg by mouth daily.  . [DISCONTINUED] omeprazole (PRILOSEC) 40 MG capsule Take 1 capsule (40 mg total) by mouth daily.  . [DISCONTINUED] ondansetron (ZOFRAN-ODT) 4 MG disintegrating tablet Take 4 mg by mouth every 8 (eight) hours as needed for nausea or vomiting.  Marland Kitchen glucose blood test strip Use as instructed  . [DISCONTINUED] glucose blood test strip Use as instructed   No facility-administered encounter medications on file as of 01/19/2016.     Follow-up: 3 months  Giavonni Cizek Adriana Simas DO [facility]

## 2016-01-19 NOTE — Progress Notes (Signed)
Pre visit review using our clinic review tool, if applicable. No additional management support is needed unless otherwise documented below in the visit note. 

## 2016-01-19 NOTE — Assessment & Plan Note (Signed)
Fairly well controlled given advanced age and comorbidities. Continue lisinopril.

## 2016-01-19 NOTE — Telephone Encounter (Signed)
Provider sending presciption to pharmacy.

## 2016-01-19 NOTE — Assessment & Plan Note (Addendum)
Uncontrolled. A1C today. Advised to take 30 units NPH daily (likely needs twice daily dosing; has had trouble with hypoglycemia and compliance). Will likely refer to and given long-standing history of insulin dependence as well as poor insight and reported failed trials of lantus/levemir. Recommending seeing our pharmacist, Hazle NordmannKelsy Combs in follow up.

## 2016-01-19 NOTE — Assessment & Plan Note (Signed)
Unsure of control. Obtaining labs. Continue simvastatin.

## 2016-01-19 NOTE — Telephone Encounter (Signed)
Patient's daughter has requested to have test strips sent to [facility] [address]

## 2016-01-19 NOTE — Assessment & Plan Note (Signed)
Labs today. Continue current dose of synthroid while awaiting TSH.

## 2016-01-19 NOTE — Patient Instructions (Addendum)
Continue the meds.  Call regarding refills.  We will call with lab results.  Take care  Dr. Adriana Simasook  Follow up in 3 months

## 2016-01-20 ENCOUNTER — Telehealth: Payer: Self-pay | Admitting: Family Medicine

## 2016-01-20 ENCOUNTER — Encounter: Payer: Self-pay | Admitting: Family Medicine

## 2016-01-20 LAB — COMPREHENSIVE METABOLIC PANEL
ALBUMIN: 4.2 g/dL (ref 3.5–5.2)
ALT: 14 U/L (ref 0–35)
AST: 14 U/L (ref 0–37)
Alkaline Phosphatase: 85 U/L (ref 39–117)
BUN: 20 mg/dL (ref 6–23)
CALCIUM: 8.6 mg/dL (ref 8.4–10.5)
CHLORIDE: 104 meq/L (ref 96–112)
CO2: 27 mEq/L (ref 19–32)
Creatinine, Ser: 1.31 mg/dL — ABNORMAL HIGH (ref 0.40–1.20)
GFR: 40.98 mL/min — ABNORMAL LOW (ref >60.00)
Glucose, Bld: 284 mg/dL — ABNORMAL HIGH (ref 70–99)
POTASSIUM: 4.7 meq/L (ref 3.5–5.1)
Sodium: 140 mEq/L (ref 135–145)
Total Bilirubin: 0.5 mg/dL (ref 0.2–1.2)
Total Protein: 6.9 g/dL (ref 6.0–8.3)

## 2016-01-20 LAB — CBC
HCT: 35 % — ABNORMAL LOW (ref 36.0–46.0)
HEMOGLOBIN: 11.8 g/dL — AB (ref 12.0–15.0)
MCHC: 33.8 g/dL (ref 30.0–36.0)
MCV: 85.8 fl (ref 78.0–100.0)
PLATELETS: 236 10*3/uL (ref 150.0–400.0)
RBC: 4.08 Mil/uL (ref 3.87–5.11)
RDW: 14.2 % (ref 11.5–15.5)
WBC: 8.6 10*3/uL (ref 4.0–10.5)

## 2016-01-20 LAB — LIPID PANEL
CHOLESTEROL: 151 mg/dL (ref 0–200)
HDL: 59.3 mg/dL (ref >39.00)
LDL CALC: 70 mg/dL (ref 0–99)
NonHDL: 91.85
TRIGLYCERIDES: 110 mg/dL (ref 0.0–149.0)
Total CHOL/HDL Ratio: 3
VLDL: 22 mg/dL (ref 0.0–40.0)

## 2016-01-20 LAB — HEMOGLOBIN A1C: Hgb A1c MFr Bld: 8.9 % — ABNORMAL HIGH (ref 4.6–6.5)

## 2016-01-20 LAB — TSH: TSH: 5.58 u[IU]/mL — ABNORMAL HIGH (ref 0.35–4.50)

## 2016-01-20 NOTE — Telephone Encounter (Signed)
Daughter called back and was given pts results. Per DPR this was okay.

## 2016-01-20 NOTE — Telephone Encounter (Signed)
pts daughter was called a voicemail was left for her to callback.

## 2016-01-20 NOTE — Telephone Encounter (Signed)
Pt daughter [name] looking for pt's lab results. Please advise, thank you!  [facility] @ [phone]

## 2016-01-22 ENCOUNTER — Other Ambulatory Visit: Payer: Self-pay | Admitting: Family Medicine

## 2016-01-22 MED ORDER — LEVOTHYROXINE SODIUM 175 MCG PO TABS: 175.0000 ug | ORAL_TABLET | Freq: Every day | ORAL | 0 refills | Status: DC

## 2016-01-23 ENCOUNTER — Telehealth: Payer: Self-pay | Admitting: Family Medicine

## 2016-01-23 ENCOUNTER — Telehealth: Payer: Self-pay | Admitting: *Deleted

## 2016-01-23 ENCOUNTER — Other Ambulatory Visit: Payer: Self-pay | Admitting: Family Medicine

## 2016-01-23 NOTE — Telephone Encounter (Signed)
Called pts daughter to inform her of Dr.Cook's comments on lab results. When daughter calls back please have her schedule a appointment with pharmacist kelsey to consult for medications.

## 2016-01-23 NOTE — Telephone Encounter (Signed)
Patients daughter requested a call  Contact [name] [phone]

## 2016-01-23 NOTE — Telephone Encounter (Signed)
Please see previous telephone notes that states [facility]

## 2016-01-23 NOTE — Telephone Encounter (Signed)
error 

## 2016-01-24 ENCOUNTER — Telehealth: Payer: Self-pay | Admitting: Family Medicine

## 2016-01-24 NOTE — Telephone Encounter (Signed)
Please see previous note sent in regards to this pt.

## 2016-01-24 NOTE — Telephone Encounter (Signed)
Pt called back returning your call. Thank you!  Call pt @ [phone]

## 2016-01-24 NOTE — Telephone Encounter (Signed)
Patients daughter requested a call, she stated that she missed a call this morning from [facility]. Pt has been scheduled with the pharmacist

## 2016-01-24 NOTE — Telephone Encounter (Signed)
A voicemail was left stating that I had not called her this morning that she may have seen where I called yesterday.

## 2016-01-26 MED ORDER — AMITRIPTYLINE HCL 75 MG PO TABS: 75.0000 mg | ORAL_TABLET | Freq: Two times a day (BID) | ORAL | 0 refills | Status: DC

## 2016-01-30 ENCOUNTER — Encounter: Payer: Self-pay | Admitting: Pharmacist

## 2016-01-30 ENCOUNTER — Ambulatory Visit (INDEPENDENT_AMBULATORY_CARE_PROVIDER_SITE_OTHER): Payer: Medicare HMO | Admitting: Pharmacist

## 2016-01-30 DIAGNOSIS — I10 Essential (primary) hypertension: Secondary | ICD-10-CM

## 2016-01-30 DIAGNOSIS — E1142 Type 2 diabetes mellitus with diabetic polyneuropathy: Secondary | ICD-10-CM | POA: Diagnosis not present

## 2016-01-30 MED ORDER — INSULIN REGULAR HUMAN 100 UNIT/ML IJ SOLN: 3.0000 [IU] | Freq: Three times a day (TID) | INTRAMUSCULAR | 0 refills | Status: DC

## 2016-01-30 NOTE — Assessment & Plan Note (Signed)
Diabetes longstanding diagnosed currently uncontrolled. Patient denies hypoglycemic events and is able to verbalize appropriate hypoglycemia management plan. Patient reports adherence with medication. Control is suboptimal due to diet and patient self adjusting insulin regimen.  Patient previously unresponsive to Levemir due to improper injection technique through her clothing.   Following discussion and approval by Dr Adriana Simasook, the following medication changes were made:  Continue Humulin N 30 units every morning Increase Humulin R 3 units before meals three times a day.  Instructed patient to only take with a substantial meal (>15 grams carbohydrates) Discussed proper injection technique and ensured patient was able to draw up 3 units insulin with a syringe.  Discussed signs/symptoms/treatment of hypoglycemia Instructed patient to check CBGs 3-4 times per day (before meals and at bedtime) Discussed low carbohydrate diet and avoiding sweets throughout the day.  Next A1C anticipated 3 months.

## 2016-01-30 NOTE — Progress Notes (Signed)
Care plan was discussed with me. I agree with the management as indicated in the note.  Everlene OtherJayce Eaven Schwager DO

## 2016-01-30 NOTE — Assessment & Plan Note (Signed)
Hypertension longstanding/newly diagnosed currently uncontrolled.  Patient reports adherence with medication. Control is suboptimal due to diet/sedentary lifestyle.  Will continue to monitor blood glucose at next visit and consider addition titration of lisinopril.  Would use caution based on patients SCr.

## 2016-01-30 NOTE — Progress Notes (Signed)
    S:    Pharmacist performed home visit today due to patients medication confusion and during visit patient was accompanied by patients daughter.  Presents for diabetes evaluation, education, and management at the request of Dr Adriana Simasook. Patient was referred on 01/19/16.  Patient was last seen by Primary Care Provider on 01/19/2016.   Patient reports Diabetes was diagnosed in since age 83 years old (38 years).  Has been on insulin for 38 years.  Patient reports adherence with medications. Her daughter assists her in filling her pill box and patient is currently at an independent/assisted living facility  Current diabetes medications include: Humulin N 30 units QAM, Humulin R (5-6 units post meals sliding scale).  Patient also takes Humulin N 10 units at night depending on her blood glucose.   Current hypertension medications include: lisinopril 10 mg daily  Patient denies hypoglycemic events.  She is able to report proper treatment knowledge  Patient reported dietary habits: Eats 2-3 meals/day Breakfast:cinnamon roll + coffee Lunch: home cooked meal from her facility peas, carrots, salisbury steak today Dinner: small meal - vegetables or protein shake Sweets: Often eats sweets with meals or as snacks  Patient reported exercise habits: None currently    Patient reports nocturia 2x/night Patient reports neuropathy in her left foot Patient denies visual changes. Patient reports self foot exams. Does report she lost a toenail but on physical exam it is growing back without any swelling or redness.    Patient currently on amitriptyline 75 mg BID.  She reports falls 1-2x/month but denies constipation or daytime drowsiness.    O:  Lab Results  Component Value Date   HGBA1C 8.9 (H) 01/19/2016   Vitals:   01/30/16 1405  BP: (!) 164/82  Pulse: 76    A/P: Diabetes longstanding diagnosed currently uncontrolled. Patient denies hypoglycemic events and is able to verbalize appropriate  hypoglycemia management plan. Patient reports adherence with medication. Control is suboptimal due to diet and patient self adjusting insulin regimen.  Patient previously unresponsive to Levemir due to improper injection technique through her clothing.   Following discussion and approval by Dr Adriana Simasook, the following medication changes were made:  Continue Humulin N 30 units every morning Increase Humulin R 3 units before meals three times a day.  Instructed patient to only take with a substantial meal (>15 grams carbohydrates) Discussed proper injection technique and ensured patient was able to draw up 3 units insulin with a syringe.  Discussed signs/symptoms/treatment of hypoglycemia Instructed patient to check CBGs 3-4 times per day (before meals and at bedtime) Discussed low carbohydrate diet and avoiding sweets throughout the day.  Next A1C anticipated 3 months.    ASCVD risk greater than 7.5%. Continued Aspirin 81 mg and Continued simvastatin 40 mg.   Hypertension longstanding/newly diagnosed currently uncontrolled.  Patient reports adherence with medication. Control is suboptimal due to diet/sedentary lifestyle.  Will continue to monitor blood glucose at next visit and consider addition titration of lisinopril.  Would use caution based on patients SCr.   Risk of Falls: Patient currently on amitriptyline 75 mg BID.  She reports falls 1-2x/month but denies constipation or daytime drowsiness.  Will consider switch of amitriptyline to alternate agent at next visit.   Written patient instructions provided.  Total time in face to face counseling 40 minutes.   Follow up in Pharmacist Clinic Visit in 2 weeks.     Medication changes were discussed and approved prior to initiation by Dr Adriana Simasook.

## 2016-02-05 ENCOUNTER — Encounter: Payer: Self-pay | Admitting: *Deleted

## 2016-02-05 ENCOUNTER — Ambulatory Visit
Admission: EM | Admit: 2016-02-05 | Discharge: 2016-02-05 | Disposition: A | Discharge status: Home / Self Care | Payer: Medicare HMO | Attending: Family Medicine | Admitting: Family Medicine

## 2016-02-05 ENCOUNTER — Ambulatory Visit (INDEPENDENT_AMBULATORY_CARE_PROVIDER_SITE_OTHER): Payer: Medicare HMO

## 2016-02-05 DIAGNOSIS — M25572 Pain in left ankle and joints of left foot: Secondary | ICD-10-CM | POA: Diagnosis not present

## 2016-02-05 DIAGNOSIS — S93402A Sprain of unspecified ligament of left ankle, initial encounter: Secondary | ICD-10-CM

## 2016-02-05 DIAGNOSIS — M7989 Other specified soft tissue disorders: Secondary | ICD-10-CM | POA: Diagnosis not present

## 2016-02-05 NOTE — ED Provider Notes (Signed)
[facility]    CSN: [id number] Arrival date & time: 02/05/16  0840  History   Chief Complaint Chief Complaint  Patient presents with  . Ankle Pain   HPI   83 year old female who is a patient of mine presents with complaints of left ankle pain after suffering a fall.  Patient states that she was walking around the house yesterday and suffered a fall. She states that her legs gave way. She has long-standing history of diabetes with associated neuropathy. She likely has underlying PAD as well. Daughter reports associated swelling of the left lateral ankle. Minimal pain. They have been elevating with improvement. Patient has minimal pain at this time. She continues to have some swelling. No erythema. No known exacerbating factors. No other complaints or concerns at this time.  Past Medical History:  Diagnosis Date  . Depression   . Esophageal dysphagia   . Gastric ulcer   . Hyperlipidemia   . Hypertension   . Hypothyroidism   . IDDM (insulin dependent diabetes mellitus) (HCC)   . Ischemic colitis (HCC)   . Osteoarthritis   . Ulcer of esophagus    Patient Active Problem List   Diagnosis Date Noted  . DM type 2 with diabetic peripheral neuropathy (HCC) 01/19/2016  . Hyperlipidemia 01/19/2016  . Hypothyroidism 01/19/2016  . Essential hypertension 01/19/2016  . Chronic pain 01/19/2016  . Other dysphagia 12/21/2015  . Esophageal dysmotility 12/21/2015   Past Surgical History:  Procedure Laterality Date  . ABDOMINAL HYSTERECTOMY    . GALLBLADDER SURGERY    . THYROIDECTOMY      OB History    No data available     Home Medications    Prior to Admission medications   Medication Sig Start Date End Date Taking? Authorizing Provider  acetaminophen-codeine (TYLENOL #3) 300-30 MG tablet Take 1 tablet by mouth every 6 (six) hours as needed for moderate pain. 01/19/16  Yes Tommie Sams, DO  amitriptyline (ELAVIL) 75 MG tablet Take 1 tablet (75 mg total) by mouth 2 times  daily at 12 noon and 4 pm. Take 2 tablets at bedtime. 01/26/16  Yes Tommie Sams, DO  aspirin 81 MG chewable tablet Chew 81 mg by mouth daily.   Yes Historical Provider, MD  Cholecalciferol (VITAMIN D) 2000 units CAPS Take by mouth daily.   Yes Historical Provider, MD  Coenzyme Q10 (COQ10) 100 MG CAPS Take 1 capsule by mouth daily.   Yes Historical Provider, MD  gabapentin (NEURONTIN) 100 MG capsule Take 1 capsule (100 mg total) by mouth 3 (three) times daily. 01/19/16  Yes Maguadalupe Lata G Renso Swett, DO  insulin NPH Human (HUMULIN N,NOVOLIN N) 100 UNIT/ML injection 30 units daily. 01/19/16  Yes Tommie Sams, DO  insulin regular (NOVOLIN R,HUMULIN R) 250 units/2.54mL (100 units/mL) injection Inject 0.03 mLs (3 Units total) into the skin 3 (three) times daily before meals. 01/30/16  Yes Tommie Sams, DO  levothyroxine (SYNTHROID, LEVOTHROID) 175 MCG tablet Take 1 tablet (175 mcg total) by mouth daily before breakfast. 01/22/16  Yes Tommie Sams, DO  lisinopril (PRINIVIL,ZESTRIL) 10 MG tablet Take 1 tablet (10 mg total) by mouth daily. 01/19/16  Yes Tommie Sams, DO  Multiple Vitamins-Minerals (MULTI COMPLETE/IRON) TABS Take 1 tablet by mouth daily.   Yes Historical Provider, MD  Multiple Vitamins-Minerals (MULTIVITAMIN WITH MINERALS) tablet Take 1 tablet by mouth daily.   Yes Historical Provider, MD  omeprazole (PRILOSEC) 40 MG capsule Take 1 capsule (40 mg total) by mouth  daily. 01/19/16  Yes Tommie Sams, DO  pyridOXINE (VITAMIN B-6) 100 MG tablet Take 100 mg by mouth daily.   Yes Historical Provider, MD  riboflavin (VITAMIN B-2) 100 MG TABS tablet Take 100 mg by mouth daily.   Yes Historical Provider, MD  simvastatin (ZOCOR) 40 MG tablet Take 40 mg by mouth daily.   Yes Historical Provider, MD  tiZANidine (ZANAFLEX) 4 MG capsule Take 4 mg by mouth 2 (two) times daily. 11/04/15  Yes Historical Provider, MD  glucose blood test strip Use as instructed 01/19/16   Tommie Sams, DO   Family History Family History  Problem  Relation Age of Onset  . Diabetes Mother   . Arthritis Mother   . Hyperlipidemia Mother   . Mental illness Mother   . Heart disease Father   . Mental illness Sister   . Arthritis Maternal Grandmother   . Arthritis Maternal Grandfather     Social History Social History  Substance Use Topics  . Smoking status: Never Smoker  . Smokeless tobacco: Never Used  . Alcohol use No   Allergies   Pravastatin  Review of Systems Review of Systems  Constitutional: Negative.   Musculoskeletal:       Fall, L ankle swelling.   Physical Exam Triage Vital Signs ED Triage Vitals  Enc Vitals Group     BP 02/05/16 0912 (!) 110/52     Pulse Rate 02/05/16 0912 80     Resp 02/05/16 0912 16     Temp 02/05/16 0912 99 F (37.2 C)     Temp Source 02/05/16 0912 Oral     SpO2 02/05/16 0912 96 %     Weight 02/05/16 0915 158 lb (71.7 kg)     Height 02/05/16 0915 5\' 6"  (1.676 m)     Head Circumference --      Peak Flow --      Pain Score --      Pain Loc --      Pain Edu? --      Excl. in GC? --    Updated Vital Signs BP (!) 110/52 (BP Location: Left Arm)   Pulse 80   Temp 99 F (37.2 C) (Oral)   Resp 16   Ht 5\' 6"  (1.676 m)   Wt 158 lb (71.7 kg)   SpO2 96%   BMI 25.50 kg/m   Physical Exam  Constitutional: She appears well-developed. No distress.  Cardiovascular: Normal rate and regular rhythm.   Pulmonary/Chest: Effort normal and breath sounds normal.  Musculoskeletal:  Left ankle - swelling noted at the lateral malleolus. Good strength and good range of motion. Nontender to palpation.  Neurological: She is alert.  Psychiatric: She has a normal mood and affect.  Vitals reviewed.  UC Treatments / Results  Labs (all labs ordered are listed, but only abnormal results are displayed) Labs Reviewed - No data to display  EKG  EKG Interpretation None      Radiology Dg Ankle Complete Left  Result Date: 02/05/2016 CLINICAL DATA:  Left ankle pain EXAM: LEFT ANKLE COMPLETE - 3+  VIEW COMPARISON:  None FINDINGS: There is no evidence of fracture, dislocation, or joint effusion. There is no evidence of arthropathy or other focal bone abnormality. Lateral soft tissue swelling identified. IMPRESSION: 1. No acute findings scratch set no acute osseous abnormalities. 2. Lateral soft tissue swelling. Electronically Signed   By: Signa Kell M.D.   On: 02/05/2016 10:07   Procedures Procedures (including critical care time)  Medications Ordered in UC Medications - No data to display   Initial Impression / Assessment and Plan / UC Course  I have reviewed the triage vital signs and the nursing notes.  Pertinent labs & imaging results that were available during my care of the patient were reviewed by me and considered in my medical decision making (see chart for details).    83 year old female presents following a fall. X-ray negative for fracture. Continue current meds. Supportive care. She's okay to ambulate as tolerated.  Final Clinical Impressions(s) / UC Diagnoses   Final diagnoses:  Sprain of left ankle, unspecified ligament, initial encounter   New Prescriptions New Prescriptions   No medications on file     Tommie SamsJayce G Anaston Koehn, DO 02/05/16 1015

## 2016-02-05 NOTE — Discharge Instructions (Signed)
Follow up closely with me.  Take care  Dr. Adriana Simasook

## 2016-02-05 NOTE — ED Triage Notes (Signed)
Pt fell yesterday, states "my legs just gave out". Now c/o left ankle pain and edema.

## 2016-02-13 ENCOUNTER — Institutional Professional Consult (permissible substitution): Payer: Medicare HMO | Admitting: Pharmacist

## 2016-02-13 ENCOUNTER — Ambulatory Visit (INDEPENDENT_AMBULATORY_CARE_PROVIDER_SITE_OTHER): Payer: Medicare HMO | Admitting: Pharmacist

## 2016-02-13 ENCOUNTER — Encounter: Payer: Self-pay | Admitting: Pharmacist

## 2016-02-13 ENCOUNTER — Telehealth: Payer: Self-pay | Admitting: Family Medicine

## 2016-02-13 ENCOUNTER — Ambulatory Visit: Payer: Medicare HMO | Admitting: Family Medicine

## 2016-02-13 DIAGNOSIS — E1142 Type 2 diabetes mellitus with diabetic polyneuropathy: Secondary | ICD-10-CM

## 2016-02-13 MED ORDER — AMITRIPTYLINE HCL 75 MG PO TABS: 150.0000 mg | ORAL_TABLET | Freq: Every day | ORAL | 0 refills | Status: DC

## 2016-02-13 MED ORDER — INSULIN NPH (HUMAN) (ISOPHANE) 100 UNIT/ML ~~LOC~~ SUSP: SUBCUTANEOUS | 0 refills | Status: DC

## 2016-02-13 MED ORDER — INSULIN REGULAR HUMAN 100 UNIT/ML IJ SOLN: 6.0000 [IU] | Freq: Three times a day (TID) | INTRAMUSCULAR | 0 refills | Status: DC

## 2016-02-13 NOTE — Telephone Encounter (Signed)
Pt daughter called and stated that she needs Dr. Adriana Simasook to write a script for a electric scooter. They got one but they were not charged tax and they need a script. Please advise, thank you!  [facility] @ [phone]

## 2016-02-13 NOTE — Patient Instructions (Addendum)
Increase Humulin R to 6 units before meals.  Only take this if you are eating  Increase Humulin N to 30 units every morning and 10 units every evening.    If you have blood glucose less than 100 mg/dL you may skip that dose of insulin   Please call clinic if you have frequent low blood glucose   Followup with Hazle NordmannKelsy Combs, PharmD via telephone in 1 week and followup with Dr Adriana Simasook in 1 week for walking evaluation.

## 2016-02-13 NOTE — Telephone Encounter (Signed)
Daughter reports she has already purchased a scooter for her mother from [employer] (telephone [phone], [phone]) in which she paid cash for but she requests a prescription from Dr Adriana Simasook so they will not have to pay tax on the scooter.   Daughter is also inquiring about a DEXA scan as she believes patients last bone scan was > 5 years ago.

## 2016-02-13 NOTE — Assessment & Plan Note (Signed)
Diabetes longstanding diagnosed currently uncontrolled. Patient denies hypoglycemic events and is able to verbalize appropriate hypoglycemia management plan. Patient reports adherence with medication. Control is suboptimal due to due to high carbohydrate diet and patient self adjusting insulin regimen.  Patient previously unresponsive to Levemir/Novolog due to improper injection technique through her clothing and daughter is very hesitant to switch to newer insulins at this time. Patient has been self adjusting Insulin N with an additional 10 units at bedtime (7 of past 14 days) and her fasting CBGs remain elevated.  She has also been self adjusting Insulin R with meals and her CBGs prior to the next meal remain elevated.    Following discussion and approval by Dr Birdie SonsSonnenberg (covering for Dr Adriana Simasook today), the following medication changes were made:  Increase Humulin N to 30 units every morning and 10 units every evening.   Increase Humulin R to 6 units before meals.  Instructed patient to only take if eating > ~15 grams. Discussed low carbohydrate diet and patient has agreed to stop eating her bear claw and cinnamon roll at breakfast Counseled on signs/symptoms/treatment of hypoglycemia Discussed GLP1 inhibitors with patient and daughter.  Patient does have a history of thyroid tumor in the past.  Will consider at future visit. Next A1C anticipated March 2018.

## 2016-02-13 NOTE — Telephone Encounter (Signed)
[name] was called and stated that she bought the pt a scooter without running through insurance. She needs a paper Rx written and faxed to medication supply store with pt name and saying she needs a scooter so that they will not charge pt taxed on the scooter. Pt is meeting with Mal MistyKelsy daughter was advised to let Mal Mistykelsy know exactly what was needed.

## 2016-02-13 NOTE — Progress Notes (Signed)
S:    Patient arrives in good spirits ambulating without assistance.  Presents for diabetes evaluation, education, and management at the request of Dr Adriana Simas. Patient was referred on 01/19/16.  Patient was last seen by Primary Care Provider on 01/19/16 and last seen in pharmacy clinic on 01/30/16. Patient did report to urgent care with left ankle sprain on 02/05/16 which today she states her ankle is improving with some residual swelling.  Patient states that she just tripped and fell and the fall was not due to hypoglycemia or dizziness.    Daughter reports she has already purchased a scooter for her mother from [employer] (telephone [phone], [phone]) in which she paid cash for but she requests a prescription from Dr Adriana Simas so they will not have to pay tax on the scooter. Daughter is also inquiring about a DEXA scan as she believes patients last bone scan was > 5 years ago.    Patient reports Diabetes was diagnosed in 1979 and patient has been insulin dependent since onset, likely due to availability of oral medications.   Patient reports adherence with medications.   Current diabetes medications include: Humulin R 3 units TID before meals (patient has been self adjusting as needed for hyperglycemia), Humulin N 30 units QAM (patient has been self adjusting with 10 units at bedtime for hyperglycemia).  Patients daughter reports failure with Levemir and Novolog in the past but also reports patient was injecting insulin pen needles through her clothes.    Current hypertension medications include: lisinopril 10 mg daily    Patient denies hypoglycemic events. Reports proper treatment knowledge.   Patient reported dietary habits: Reports she wants to stop bearclaws and cinnamon rolls for breakfast. These are from [employer] and have 60-100 grams of carbohydrates.   Patient reported exercise habits: None currently.     Patient reports nocturia 2x/night.  Patient reports neuropathy. Patient  denies visual changes. Reports last eye exam was  Patient reports self foot exams. Denies problems    O:  Lab Results  Component Value Date   HGBA1C 8.9 (H) 01/19/2016   Vitals:   02/13/16 1443  BP: (!) 160/70  Pulse: 73    Home fasting CBG: 270, 160, 203, 218, 136, 246, 118, 207, 175, 161, 112, 360 mg/dL Before Lunch CBG: 295, 345, 227, 277, 300, 271, 323, 195, 235 mg/dL Before Dinner CBG: 621, 350, 347, 132, 285, 274, 270, 308, 172 mg/dL Bedtime CBG: 308, 657, 349, 193, 488, 274, 270, 194, 172, 110 mg/dL (see scanned CBG log)   A/P: Diabetes longstanding diagnosed currently uncontrolled. Patient denies hypoglycemic events and is able to verbalize appropriate hypoglycemia management plan. Patient reports adherence with medication. Control is suboptimal due to due to high carbohydrate diet and patient self adjusting insulin regimen.  Patient previously unresponsive to Levemir/Novolog due to improper injection technique through her clothing and daughter is very hesitant to switch to newer insulins at this time. Patient has been self adjusting Insulin N with an additional 10 units at bedtime (7 of past 14 days) and her fasting CBGs remain elevated.  She has also been self adjusting Insulin R with meals and her CBGs prior to the next meal remain elevated.    Following discussion and approval by Dr Birdie Sons (covering for Dr Adriana Simas today), the following medication changes were made:  Increase Humulin N to 30 units every morning and 10 units every evening.   Increase Humulin R to 6 units before meals.  Instructed patient to  only take if eating > ~15 grams. Discussed low carbohydrate diet and patient has agreed to stop eating her bear claw and cinnamon roll at breakfast Counseled on signs/symptoms/treatment of hypoglycemia.  Instructed patient/daughter to call clinic if she experiences hypoglycemia Discussed GLP1 inhibitors with patient and daughter.  Patient does have a history of thyroid  tumor in the past.  Will consider at future visit. Next A1C anticipated March 2018.     ASCVD risk greater than 7.5%. Continued Aspirin 81 mg and Continued simvastatin 40 mg.   Hypertension longstanding diagnosed currently uncontrolled at todays visit.  Patient reports adherence with medication. Will continue to monitor as patients BP controlled at last visit.  Written patient instructions provided.  Total time in face to face counseling 50 minutes.   Follow up in Pharmacist Clinic Visit in 1 week (telephone) and followup with Dr Adriana Simasook for mobility evaluation in 1 week.   Medication changes were discussed and approved prior to initiation by Dr Birdie SonsSonnenberg (Covering for Dr Adriana Simasook today).   Hazle NordmannKelsy Ezio Wieck, PharmD, BCPS [facility] PGY2 Pharmacy Resident [phone]

## 2016-02-14 NOTE — Progress Notes (Signed)
PCP was out of the office at the time of this visit. I discussed with the pharmacist and agree with his management.  [name], M.D.

## 2016-02-14 NOTE — Progress Notes (Signed)
Care was provided under my supervision. I agree with the management as indicated in the note.  Devaughn Savant DO  

## 2016-02-15 ENCOUNTER — Other Ambulatory Visit: Payer: Self-pay | Admitting: Family Medicine

## 2016-02-16 ENCOUNTER — Other Ambulatory Visit: Payer: Self-pay | Admitting: Family Medicine

## 2016-02-16 DIAGNOSIS — E2839 Other primary ovarian failure: Secondary | ICD-10-CM

## 2016-02-16 NOTE — Telephone Encounter (Signed)
rx faxed to location that pt bought scooter from.

## 2016-02-16 NOTE — Telephone Encounter (Signed)
I have ordered the Dexa. She can pick up hand written Rx for scooter.

## 2016-02-17 ENCOUNTER — Telehealth: Payer: Self-pay | Admitting: Pharmacist

## 2016-02-17 NOTE — Telephone Encounter (Signed)
02/17/16  Called patient today to assess home CBGs following insulin increase at last visit.  Patient denies s/sx of hypoglycemia.  Patient reports adherence to Humulin R 6 units before meals and Humulin N 30 units QAM and Humulin N 10 units QPM.  Patient states her CBG today was 204 mg/dL.  She reports most CBGs are in the low 200s but is unable to read them off at this time.  She does report one episode this week with CBG in 400-500 mg/dL range after she missed her insulin and went out to eat with her daughter.  Plan:  Followup CBGs next week (daughter plans to email me patients CBG log from this week). Will determine if patient continues to self adjust her insulin after discussing the importance to not do this at previous visit Will consider further increase of insulin at that time after discussion with Dr Adriana Simasook.    Hazle NordmannKelsy Kinsleigh Ludolph, PharmD, BCPS [facility] PGY2 Pharmacy Resident [phone]

## 2016-02-19 ENCOUNTER — Other Ambulatory Visit: Payer: Self-pay | Admitting: Family Medicine

## 2016-02-20 ENCOUNTER — Other Ambulatory Visit: Payer: Self-pay | Admitting: Family Medicine

## 2016-02-20 NOTE — Telephone Encounter (Addendum)
02/20/16 Pharmacist called patient to followup home CBGs today but was unable to reach her.  Called patients daughter as reminder to send copy of patients home CBG readings.    Daughter reports patient one episode of hypoglycemia with CBG of 56 mg/dL which daughter states is due to patient being off schedule on Saturday and not eating lunch at her normal time.  Daughter reports CBG normalized to >70 mg/dL after 2 glucose tablets. Daughter does report patient has stopped eating cinnamon rolls for breakfast.    Daughter reports patient has started physical therapy but patient still has some ankle pain in which she wants to discuss with Dr Adriana Simasook.    Assessment/Plan:  -Patients daughter to send Pharmacist patient CBGs and plans to bring CBG log to visit with Dr Adriana Simasook tomorrow.  -Patient at risk for hypoglycemia with NPH and regular insulin regimen and advanced age.  Have previously discussed transition to newer long acting/rapid acting insulin via pen to reduce risk of hypoglycemia but patient/daughter remain resistant -Patients GFR is below threshold for initiating SGLT2 inhibitor -May need to consider DPP4 inhibitor Tradjenta (Linagliptin) [not renally dosed] or GLP1 agonist in the future but patient does have a history of thyroid tumor per patient report.  Risk vs benefit should be considered as trial data has shown no additional risk of thyroid carcinomas.  Would likely not initiate GLP1 or DPP4 unless specific type of thyroid tumor is confirmed.   Hazle NordmannKelsy Combs, PharmD, BCPS [facility] PGY2 Pharmacy Resident [phone]

## 2016-02-21 ENCOUNTER — Encounter: Payer: Self-pay | Admitting: Family Medicine

## 2016-02-21 ENCOUNTER — Ambulatory Visit (INDEPENDENT_AMBULATORY_CARE_PROVIDER_SITE_OTHER): Payer: Medicare HMO | Admitting: Family Medicine

## 2016-02-21 DIAGNOSIS — E1142 Type 2 diabetes mellitus with diabetic polyneuropathy: Secondary | ICD-10-CM

## 2016-02-21 DIAGNOSIS — E785 Hyperlipidemia, unspecified: Secondary | ICD-10-CM

## 2016-02-21 DIAGNOSIS — I10 Essential (primary) hypertension: Secondary | ICD-10-CM

## 2016-02-21 DIAGNOSIS — N183 Chronic kidney disease, stage 3 unspecified: Secondary | ICD-10-CM | POA: Insufficient documentation

## 2016-02-21 MED ORDER — METHOCARBAMOL 500 MG PO TABS: 500.0000 mg | ORAL_TABLET | Freq: Three times a day (TID) | ORAL | 1 refills | Status: DC | PRN

## 2016-02-21 MED ORDER — LINAGLIPTIN 5 MG PO TABS: 5.0000 mg | ORAL_TABLET | Freq: Every day | ORAL | 1 refills | Status: DC

## 2016-02-21 MED ORDER — GLUCOSE BLOOD VI STRP: ORAL_STRIP | 11 refills | Status: DC

## 2016-02-21 MED ORDER — "INSULIN SYRINGE 29G X 1/2"" 1 ML MISC": 11 refills | Status: DC

## 2016-02-21 NOTE — Patient Instructions (Signed)
Continue your medications.  Kelsy and I will talk before starting the oral.  Follow up in 3 months.  Take care  Dr. Adriana Simasook

## 2016-02-21 NOTE — Assessment & Plan Note (Signed)
Stable. Continue lisinopril

## 2016-02-21 NOTE — Progress Notes (Signed)
Subjective:  Patient ID: Amy Calhoun, female    DOB: 07/14/1928  Age: 83 y.o. MRN: [id number]  CC: Follow up  HPI:  83 year old female with HTN, HLD, DM-2, CKD presents for follow up.  Hypertension  Well controlled on Lisinopril.  HLD  Well controlled/at goal on Simvastatin.  DM-2  Sugars improving but Diabetes is not well controlled at this time.  Has been work with pharmacist [employer]y Combs.  She is currently on Humulin N 30 units AM & 10 units PM; Humulin R 6 units before meals.   She has had 2 recent low blood sugars (she was essentially asymptomatic).  Social Hx   Social History   Social History  . Marital status: Widowed    Spouse name: N/A  . Number of children: N/A  . Years of education: N/A   Occupational History  . retired    Social History Main Topics  . Smoking status: Never Smoker  . Smokeless tobacco: Never Used  . Alcohol use No  . Drug use: No  . Sexual activity: No   Other Topics Concern  . None   Social History Narrative  . None    Review of Systems  Constitutional: Negative.   Musculoskeletal:       Muscle spasms.   Objective:  BP 126/68   Pulse 81   Wt 158 lb 12.8 oz (72 kg)   SpO2 97%   BMI 25.63 kg/m   BP/Weight 02/21/2016 02/13/2016 02/05/2016  Systolic BP 126 160 110  Diastolic BP 68 70 52  Wt. (Lbs) 158.8 157 158  BMI 25.63 25.34 25.5    Physical Exam  Constitutional: She appears well-developed. No distress.  Cardiovascular: Regular rhythm.   Pulmonary/Chest: Effort normal and breath sounds normal.  Neurological: She is alert.  Vitals reviewed.   Lab Results  Component Value Date   WBC 8.6 01/19/2016   HGB 11.8 (L) 01/19/2016   HCT 35.0 (L) 01/19/2016   PLT 236.0 01/19/2016   GLUCOSE 284 (H) 01/19/2016   CHOL 151 01/19/2016   TRIG 110.0 01/19/2016   HDL 59.30 01/19/2016   LDLCALC 70 01/19/2016   ALT 14 01/19/2016   AST 14 01/19/2016   NA 140 01/19/2016   K 4.7 01/19/2016   CL 104 01/19/2016   CREATININE 1.31 (H) 01/19/2016   BUN 20 01/19/2016   CO2 27 01/19/2016   TSH 5.58 (H) 01/19/2016   HGBA1C 8.9 (H) 01/19/2016    Assessment & Plan:   Problem List Items Addressed This Visit    DM type 2 with diabetic peripheral neuropathy (HCC)    Uncontrolled. Discussed switching insulins and patient and daughter were very hesitant. Will continue current insulin regimen and add Tradjenta (no reported history of thyroid cancer).      Relevant Medications   methocarbamol (ROBAXIN) 500 MG tablet   linagliptin (TRADJENTA) 5 MG TABS tablet   Essential hypertension    Stable. Continue lisinopril.       Hyperlipidemia    Well controlled. Continue Zocor.         Meds ordered this encounter  Medications  . glucose blood (ONETOUCH VERIO) test strip    Sig: USE AS INSTRUCTED to check sugars up to four times daily. E11.9    Dispense:  100 each    Refill:  11  . methocarbamol (ROBAXIN) 500 MG tablet    Sig: Take 1 tablet (500 mg total) by mouth every 8 (eight) hours as needed for muscle spasms.  Dispense:  90 tablet    Refill:  1  . INSULIN SYRINGE 1CC/29G 29G X 1/2" 1 ML MISC    Sig: As directed to administer insulin    Dispense:  200 each    Refill:  11  . linagliptin (TRADJENTA) 5 MG TABS tablet    Sig: Take 1 tablet (5 mg total) by mouth daily.    Dispense:  90 tablet    Refill:  1    Follow-up: 3 months.  Everlene Other DO [facility]

## 2016-02-21 NOTE — Progress Notes (Signed)
Pre visit review using our clinic review tool, if applicable. No additional management support is needed unless otherwise documented below in the visit note. 

## 2016-02-21 NOTE — Assessment & Plan Note (Signed)
Well controlled. Continue Zocor. 

## 2016-02-21 NOTE — Assessment & Plan Note (Signed)
Uncontrolled. Discussed switching insulins and patient and daughter were very hesitant. Will continue current insulin regimen and add Tradjenta (no reported history of thyroid cancer).

## 2016-02-22 ENCOUNTER — Telehealth: Payer: Self-pay | Admitting: Family Medicine

## 2016-02-22 NOTE — Telephone Encounter (Signed)
Pt daughter [name] called and stated that she needs syringes for her insulin. She needs the 50 cc. Please advise, thank you!  Pharmacy [facility] [phone][facility][id number] [address]- [place], [place] - [phone] [facility] [address]  Call [facility] @ [phone]

## 2016-02-23 ENCOUNTER — Telehealth: Payer: Self-pay | Admitting: Family Medicine

## 2016-02-23 ENCOUNTER — Other Ambulatory Visit: Payer: Self-pay

## 2016-02-23 NOTE — Telephone Encounter (Signed)
Daughter was called and informed of what pharmacy said.

## 2016-02-23 NOTE — Telephone Encounter (Signed)
Pt daughter called and stated that pt needs a new rx for her one touch verio test strips and it needs to have the diagnostic code and how many times a day she test. Pt tests 4 times daily, and they would like several refills on the rx. They also need insulin syringe 1cc/29g 29g x 1/2" 1 ML Misc. Please advise, thank you!  Ph[facility] [phone]#[id number] [address]- [place], [place] - [id number]  Call daughter @ [phone]

## 2016-02-23 NOTE — Telephone Encounter (Signed)
Pts [facility] stating that we had sent RX's. Pharmacy was called and they stated pt or daughter had to sign a form for them to bill medicare Part B.

## 2016-02-24 ENCOUNTER — Telehealth: Payer: Self-pay | Admitting: Family Medicine

## 2016-02-24 NOTE — Telephone Encounter (Signed)
A PA was completed for pt tradjenta.

## 2016-02-27 NOTE — Telephone Encounter (Signed)
Confirmed with pharmacy patient got insulin syringes

## 2016-02-28 ENCOUNTER — Telehealth: Payer: Self-pay

## 2016-02-28 NOTE — Telephone Encounter (Signed)
-----   Message from Leta BaptistJessica D Zehr, PA-C sent at 02/28/2016  4:02 PM EST ----- Alexia FreestonePatty,  I saw this patient back in December. She was here with her daughter for complaints of dysphagia. [place] and [place] did [place] records. I requested those and just received them and was able to review them. It appears that her last endoscopy was in August when she actually had a food impaction which was removed but they did not see any stricture at that time. Then in October she had an esophagram that showed dysmotility and mention of a smooth narrowing at the distal part of the esophagus. I discussed with Dr. Christella HartiganJacobs. We would like you to contact her and see if she is still having complaints of dysphagia, how frequent, etc. before deciding on whether or not we should repeat endoscopy.  Thank you,  Jess

## 2016-02-29 ENCOUNTER — Telehealth: Payer: Self-pay | Admitting: Pharmacist

## 2016-02-29 DIAGNOSIS — E1142 Type 2 diabetes mellitus with diabetic polyneuropathy: Secondary | ICD-10-CM

## 2016-02-29 NOTE — Telephone Encounter (Signed)
Patients daughter called and actually stated patient has NOT started Tradjenta.  Recommended patient stay hydrated and to hold regular insulin if she is unable to keep any food down.  Recommended urgent care if patient does get worse or if nausea/vomiting persists.    Hazle NordmannKelsy Combs, PharmD, BCPS [facility] PGY2 Pharmacy Resident [phone]

## 2016-02-29 NOTE — Telephone Encounter (Signed)
02/29/16   83 year old female referred to me by Dr Adriana Simasook for diabetes management.  Called patient today to discuss CBGs after her daughter emailed me her CBGs on Monday.  Talked with patient and her grand daughter.  Patient states she has not been feeling well and has had some nausea/vomiting which started yesterday.  Patient states she started the linagliptin last Saturday and tolerated it without any side effects but she is unsure if this is medication related or an outside illness.  Patient denies fever or sweating but does report slight chills and some dizziness.  Patient denies signs/symptoms of hypoglycemia.  CBGs Yesterday: 189, 248, 92, 187 CBGs today before breakfast: 189 mg/dL.      Assesment/Plan: N/V of unknown source.  May be related to linagliptin initiation but likely some other infection.   Discussed importance of staying well hydrated and to try eating bland food such as crackers. Instructed if patients condition becomes worse or she develops a fever then she may need to followup with Dr Adriana Simasook  Instructed patient if not eating then she should not take the Regular insulin or if she is unable to keep anything down.   Hazle NordmannKelsy Combs, PharmD, BCPS [facility] PGY2 Pharmacy Resident [phone]

## 2016-02-29 NOTE — Telephone Encounter (Signed)
No answer and no voice mail.

## 2016-03-01 ENCOUNTER — Encounter: Payer: Self-pay | Admitting: Medical Oncology

## 2016-03-01 ENCOUNTER — Emergency Department: Payer: Medicare HMO

## 2016-03-01 ENCOUNTER — Ambulatory Visit
Admission: EM | Admit: 2016-03-01 | Discharge: 2016-03-01 | Disposition: A | Discharge status: Home / Self Care | Payer: Medicare HMO | Attending: Emergency Medicine | Admitting: Emergency Medicine

## 2016-03-01 ENCOUNTER — Emergency Department
Admission: EM | Admit: 2016-03-01 | Discharge: 2016-03-01 | Disposition: A | Discharge status: Home / Self Care | Payer: Medicare HMO | Attending: Emergency Medicine | Admitting: Emergency Medicine

## 2016-03-01 DIAGNOSIS — E871 Hypo-osmolality and hyponatremia: Secondary | ICD-10-CM

## 2016-03-01 DIAGNOSIS — Z791 Long term (current) use of non-steroidal anti-inflammatories (NSAID): Secondary | ICD-10-CM | POA: Insufficient documentation

## 2016-03-01 DIAGNOSIS — I129 Hypertensive chronic kidney disease with stage 1 through stage 4 chronic kidney disease, or unspecified chronic kidney disease: Secondary | ICD-10-CM | POA: Insufficient documentation

## 2016-03-01 DIAGNOSIS — E039 Hypothyroidism, unspecified: Secondary | ICD-10-CM | POA: Diagnosis not present

## 2016-03-01 DIAGNOSIS — Z794 Long term (current) use of insulin: Secondary | ICD-10-CM | POA: Diagnosis not present

## 2016-03-01 DIAGNOSIS — E860 Dehydration: Secondary | ICD-10-CM

## 2016-03-01 DIAGNOSIS — M25552 Pain in left hip: Secondary | ICD-10-CM | POA: Diagnosis not present

## 2016-03-01 DIAGNOSIS — Z79899 Other long term (current) drug therapy: Secondary | ICD-10-CM | POA: Insufficient documentation

## 2016-03-01 DIAGNOSIS — Z7982 Long term (current) use of aspirin: Secondary | ICD-10-CM | POA: Diagnosis not present

## 2016-03-01 DIAGNOSIS — S79912A Unspecified injury of left hip, initial encounter: Secondary | ICD-10-CM | POA: Diagnosis not present

## 2016-03-01 DIAGNOSIS — N183 Chronic kidney disease, stage 3 (moderate): Secondary | ICD-10-CM | POA: Diagnosis not present

## 2016-03-01 DIAGNOSIS — E1122 Type 2 diabetes mellitus with diabetic chronic kidney disease: Secondary | ICD-10-CM | POA: Insufficient documentation

## 2016-03-01 DIAGNOSIS — N342 Other urethritis: Secondary | ICD-10-CM | POA: Diagnosis not present

## 2016-03-01 DIAGNOSIS — R42 Dizziness and giddiness: Secondary | ICD-10-CM

## 2016-03-01 DIAGNOSIS — R51 Headache: Secondary | ICD-10-CM | POA: Insufficient documentation

## 2016-03-01 DIAGNOSIS — R05 Cough: Secondary | ICD-10-CM | POA: Diagnosis not present

## 2016-03-01 LAB — URINALYSIS, COMPLETE (UACMP) WITH MICROSCOPIC
Bacteria, UA: NONE SEEN
Bilirubin Urine: NEGATIVE
GLUCOSE, UA: NEGATIVE mg/dL
Hgb urine dipstick: NEGATIVE
Ketones, ur: NEGATIVE mg/dL
Nitrite: NEGATIVE
PROTEIN: NEGATIVE mg/dL
RBC / HPF: NONE SEEN RBC/hpf (ref 0–5)
Specific Gravity, Urine: 1.01 (ref 1.005–1.030)
pH: 5 (ref 5.0–8.0)

## 2016-03-01 LAB — COMPREHENSIVE METABOLIC PANEL
ALT: 16 U/L (ref 14–54)
AST: 22 U/L (ref 15–41)
Albumin: 4.3 g/dL (ref 3.5–5.0)
Alkaline Phosphatase: 84 U/L (ref 38–126)
Anion gap: 10 (ref 5–15)
BUN: 28 mg/dL — AB (ref 6–20)
CHLORIDE: 93 mmol/L — AB (ref 101–111)
CO2: 26 mmol/L (ref 22–32)
CREATININE: 1.35 mg/dL — AB (ref 0.44–1.00)
Calcium: 8.5 mg/dL — ABNORMAL LOW (ref 8.9–10.3)
GFR calc Af Amer: 40 mL/min — ABNORMAL LOW (ref >60)
GFR, EST NON AFRICAN AMERICAN: 35 mL/min — AB (ref >60)
Glucose, Bld: 283 mg/dL — ABNORMAL HIGH (ref 65–99)
Potassium: 4.4 mmol/L (ref 3.5–5.1)
Sodium: 129 mmol/L — ABNORMAL LOW (ref 135–145)
Total Bilirubin: 0.9 mg/dL (ref 0.3–1.2)
Total Protein: 7.6 g/dL (ref 6.5–8.1)

## 2016-03-01 LAB — CBC WITH DIFFERENTIAL/PLATELET
BASOS ABS: 0.1 10*3/uL (ref 0–0.1)
Basophils Relative: 1 %
EOS PCT: 1 %
Eosinophils Absolute: 0.1 10*3/uL (ref 0–0.7)
HEMATOCRIT: 36.8 % (ref 35.0–47.0)
HEMOGLOBIN: 12.5 g/dL (ref 12.0–16.0)
LYMPHS PCT: 21 %
Lymphs Abs: 2.3 10*3/uL (ref 1.0–3.6)
MCH: 29.2 pg (ref 26.0–34.0)
MCHC: 34 g/dL (ref 32.0–36.0)
MCV: 86 fL (ref 80.0–100.0)
Monocytes Absolute: 1.1 10*3/uL — ABNORMAL HIGH (ref 0.2–0.9)
Monocytes Relative: 10 %
NEUTROS ABS: 7.5 10*3/uL — AB (ref 1.4–6.5)
NEUTROS PCT: 67 %
PLATELETS: 239 10*3/uL (ref 150–440)
RBC: 4.28 MIL/uL (ref 3.80–5.20)
RDW: 14.5 % (ref 11.5–14.5)
WBC: 11 10*3/uL (ref 3.6–11.0)

## 2016-03-01 LAB — TROPONIN I

## 2016-03-01 LAB — TSH: TSH: 3.72 u[IU]/mL (ref 0.350–4.500)

## 2016-03-01 MED ORDER — METOCLOPRAMIDE HCL 5 MG/ML IJ SOLN
10.0000 mg | Freq: Once | INTRAMUSCULAR | Status: AC
  Administered 2016-03-01: 10 mg via INTRAVENOUS
  Filled 2016-03-01: qty 2

## 2016-03-01 MED ORDER — DIPHENHYDRAMINE HCL 50 MG/ML IJ SOLN
12.5000 mg | Freq: Once | INTRAMUSCULAR | Status: AC
  Administered 2016-03-01: 12.5 mg via INTRAVENOUS
  Filled 2016-03-01: qty 1

## 2016-03-01 MED ORDER — LIDOCAINE 5 % EX PTCH: 1.0000 | MEDICATED_PATCH | Freq: Two times a day (BID) | CUTANEOUS | 0 refills | Status: DC

## 2016-03-01 MED ORDER — TRAMADOL HCL 50 MG PO TABS
50.0000 mg | ORAL_TABLET | Freq: Once | ORAL | Status: AC
  Administered 2016-03-01: 50 mg via ORAL
  Filled 2016-03-01: qty 1

## 2016-03-01 MED ORDER — SODIUM CHLORIDE 0.9 % IV BOLUS (SEPSIS)
1000.0000 mL | Freq: Once | INTRAVENOUS | Status: AC
Start: 2016-03-01 — End: 2016-03-01
  Administered 2016-03-01: 1000 mL via INTRAVENOUS

## 2016-03-01 MED ORDER — CEFTRIAXONE SODIUM 1 G IJ SOLR: 1.0000 g | Freq: Once | INTRAMUSCULAR | Status: DC

## 2016-03-01 MED ORDER — TRAMADOL HCL 50 MG PO TABS: 50.0000 mg | ORAL_TABLET | Freq: Four times a day (QID) | ORAL | 0 refills | Status: DC | PRN

## 2016-03-01 MED ORDER — LIDOCAINE 5 % EX PTCH
1.0000 | MEDICATED_PATCH | CUTANEOUS | Status: DC
  Administered 2016-03-01: 1 via TRANSDERMAL

## 2016-03-01 MED ORDER — CEFTRIAXONE SODIUM-DEXTROSE 1-3.74 GM-% IV SOLR
1.0000 g | Freq: Once | INTRAVENOUS | Status: AC
  Administered 2016-03-01: 1 g via INTRAVENOUS
  Filled 2016-03-01: qty 50

## 2016-03-01 MED ORDER — LIDOCAINE 5 % EX PTCH
MEDICATED_PATCH | CUTANEOUS | Status: AC
  Administered 2016-03-01: 1 via TRANSDERMAL
  Filled 2016-03-01: qty 1

## 2016-03-01 MED ORDER — CEPHALEXIN 500 MG PO CAPS: 500.0000 mg | ORAL_CAPSULE | Freq: Three times a day (TID) | ORAL | 0 refills | Status: AC

## 2016-03-01 NOTE — ED Provider Notes (Signed)
CSN: [id number]     Arrival date & time 03/01/16  4098 History   First MD Initiated Contact with Patient 03/01/16 [phone]     Chief Complaint  Patient presents with  . Dizziness   (Consider location/radiation/quality/duration/timing/severity/associated sxs/prior Treatment) HPI  This 83 year old female who presents with her daughter complaining of dizziness and nausea for a couple of days. He is drinking fluids but very Little as her daughter relates. Sugars have not been very well controlled and the pharmacist has been helping her to manage them by phone. Complains of very blurred vision and headache. She is also having left hip pain after a fall that she susstained one month ago. She states that it hurts very badly.       Past Medical History:  Diagnosis Date  . Depression   . Esophageal dysphagia   . Gastric ulcer   . Hyperlipidemia   . Hypertension   . Hypothyroidism   . IDDM (insulin dependent diabetes mellitus) (HCC)   . Ischemic colitis (HCC)   . Osteoarthritis   . Ulcer of esophagus    Past Surgical History:  Procedure Laterality Date  . ABDOMINAL HYSTERECTOMY    . GALLBLADDER SURGERY    . THYROIDECTOMY     Family History  Problem Relation Age of Onset  . Diabetes Mother   . Arthritis Mother   . Hyperlipidemia Mother   . Mental illness Mother   . Heart disease Father   . Mental illness Sister   . Arthritis Maternal Grandmother   . Arthritis Maternal Grandfather    Social History  Substance Use Topics  . Smoking status: Never Smoker  . Smokeless tobacco: Never Used  . Alcohol use No   OB History    No data available     Review of Systems  Constitutional: Positive for activity change, appetite change and fatigue. Negative for diaphoresis and fever.  Musculoskeletal: Positive for arthralgias.  Neurological: Positive for dizziness and weakness.  All other systems reviewed and are negative.   Allergies  Pravastatin  Home Medications   Prior to  Admission medications   Medication Sig Start Date End Date Taking? Authorizing Provider  acetaminophen-codeine (TYLENOL #3) 300-30 MG tablet Take 1 tablet by mouth every 6 (six) hours as needed for moderate pain. 01/19/16  Yes Tommie Sams, DO  amitriptyline (ELAVIL) 75 MG tablet Take 2 tablets (150 mg total) by mouth at bedtime. 02/13/16  Yes Tommie Sams, DO  aspirin 81 MG chewable tablet Chew 81 mg by mouth daily.   Yes Historical Provider, MD  Cholecalciferol (VITAMIN D) 2000 units CAPS Take by mouth daily.   Yes Historical Provider, MD  Coenzyme Q10 (COQ10) 100 MG CAPS Take 1 capsule by mouth daily.   Yes Historical Provider, MD  gabapentin (NEURONTIN) 100 MG capsule Take 1 capsule (100 mg total) by mouth 3 (three) times daily. 01/19/16  Yes Jayce G Cook, DO  glucose blood (ONETOUCH VERIO) test strip USE AS INSTRUCTED to check sugars up to four times daily. E11.9 02/21/16  Yes Tommie Sams, DO  insulin NPH Human (HUMULIN N,NOVOLIN N) 100 UNIT/ML injection 30 units every morning and 10 units at night 02/13/16  Yes Jayce G Cook, DO  insulin regular (NOVOLIN R,HUMULIN R) 250 units/2.18mL (100 units/mL) injection Inject 0.06 mLs (6 Units total) into the skin 3 (three) times daily before meals. 02/13/16  Yes Tommie Sams, DO  INSULIN SYRINGE 1CC/29G 29G X 1/2" 1 ML MISC As directed to administer insulin  02/21/16  Yes Tommie SamsJayce G Cook, DO  levothyroxine (SYNTHROID, LEVOTHROID) 175 MCG tablet Take 1 tablet (175 mcg total) by mouth daily before breakfast. 01/22/16  Yes Tommie SamsJayce G Cook, DO  linagliptin (TRADJENTA) 5 MG TABS tablet Take 1 tablet (5 mg total) by mouth daily. 02/21/16  Yes Tommie SamsJayce G Cook, DO  lisinopril (PRINIVIL,ZESTRIL) 10 MG tablet Take 1 tablet (10 mg total) by mouth daily. 01/19/16  Yes Tommie SamsJayce G Cook, DO  methocarbamol (ROBAXIN) 500 MG tablet Take 1 tablet (500 mg total) by mouth every 8 (eight) hours as needed for muscle spasms. 02/21/16  Yes Tommie SamsJayce G Cook, DO  Multiple Vitamins-Minerals (MULTI COMPLETE/IRON) TABS  Take 1 tablet by mouth daily.   Yes Historical Provider, MD  Multiple Vitamins-Minerals (MULTIVITAMIN WITH MINERALS) tablet Take 1 tablet by mouth daily.   Yes Historical Provider, MD  omeprazole (PRILOSEC) 40 MG capsule Take 1 capsule (40 mg total) by mouth daily. 01/19/16  Yes Tommie SamsJayce G Cook, DO  pyridOXINE (VITAMIN B-6) 100 MG tablet Take 100 mg by mouth daily.   Yes Historical Provider, MD  riboflavin (VITAMIN B-2) 100 MG TABS tablet Take 100 mg by mouth daily.   Yes Historical Provider, MD  simvastatin (ZOCOR) 40 MG tablet Take 40 mg by mouth daily.   Yes Historical Provider, MD   Meds Ordered and Administered this Visit  Medications - No data to display  BP (!) 138/46 (BP Location: Left Arm)   Pulse 81   Temp 97.7 F (36.5 C) (Oral)   Resp 18   Ht 5\' 6"  (1.676 m)   Wt 158 lb (71.7 kg)   SpO2 96%   BMI 25.50 kg/m  Orthostatic VS for the past 24 hrs:  BP- Sitting Pulse- Sitting BP- Standing at 0 minutes Pulse- Standing at 0 minutes  03/01/16 0841 - - 104/41 79  03/01/16 0839 127/48 85 - -    Physical Exam  Constitutional: She is oriented to person, place, and time. She appears well-developed and well-nourished. No distress.  HENT:  Head: Normocephalic and atraumatic.  Eyes: Pupils are equal, round, and reactive to light. Right eye exhibits no discharge. Left eye exhibits no discharge.  Pulmonary/Chest: No stridor.  Musculoskeletal: Normal range of motion. She exhibits no deformity.  Neurological: She is alert and oriented to person, place, and time.  Skin: Skin is warm and dry. She is not diaphoretic.  Psychiatric: She has a normal mood and affect. Her behavior is normal. Judgment and thought content normal.  Nursing note and vitals reviewed.   Urgent Care Course     Procedures (including critical care time)  Labs Review Labs Reviewed - No data to display  Imaging Review No results found.   Visual Acuity Review  Right Eye Distance:   Left Eye Distance:    Bilateral Distance:    Right Eye Near:   Left Eye Near:    Bilateral Near:         MDM   1. Dehydration   2. Dizziness    Had a discussion with the patient and her daughter who accompanies her. I explained to them that her condition along with her age and her dehydration will require a higher level of care and possible hospital admission in order to control her blood sugars and re hydrate her adequately. Further testing will also be required for her hip pain. Both the daughter and the patient were agreeable. The daughter will transport her by privately owned vehicle. [facility]  notified of their transfer. Were offered EMS transport but chose to go privately    Lutricia Feil, PA-C 03/01/16 0915    Lutricia Feil, PA-C 03/01/16 [phone]

## 2016-03-01 NOTE — ED Notes (Signed)
Pt assisted to toilet to urinate 

## 2016-03-01 NOTE — ED Triage Notes (Addendum)
Pt c/o dizzy, nausea for a couple of days. She also has L hip pain since a fall about a month ago. She had some changes in her medications last month also that may be causing the dizziness. She has vomited once, but is able to keep fluids done. Appetite changes not eating as much. Blood sugars have been 300 last night and 255 this am

## 2016-03-01 NOTE — ED Provider Notes (Addendum)
[facility]-EMERGENCY DEPARTMENT Provider Note   CSN: [id number] Arrival date & time: 03/01/16  4098     History   Chief Complaint Chief Complaint  Patient presents with  . Headache  . Dizziness  . Hip Pain   Amy Calhoun is a 83 y.o. female history of dysphasia, stomach ulcer, hypertension, hyperlipidemia, ischemic colitis here presenting with decreased by mouth intake, headaches, dizziness, left hip pain. Patient states that she fell about a month ago and has been having persistent left hip pain. Took some Tylenol No. 3 with no relief. Patient been getting physical therapy for the hip pain with minimal relief as well. For the last several days, patient has been drinking very little and has been nauseated and felt lightheaded and dizzy. Denies any room spinning. Patient went to urgent care and was thought to be dehydrated and sent here for IV fluids and further evaluation. Denies any history of strokes in the past.    The history is provided by the patient and a relative.    Past Medical History:  Diagnosis Date  . Depression   . Esophageal dysphagia   . Gastric ulcer   . Hyperlipidemia   . Hypertension   . Hypothyroidism   . IDDM (insulin dependent diabetes mellitus) (HCC)   . Ischemic colitis (HCC)   . Osteoarthritis   . Ulcer of esophagus     Patient Active Problem List   Diagnosis Date Noted  . CKD (chronic kidney disease) stage 3, GFR 30-59 ml/min 02/21/2016  . DM type 2 with diabetic peripheral neuropathy (HCC) 01/19/2016  . Hyperlipidemia 01/19/2016  . Hypothyroidism 01/19/2016  . Essential hypertension 01/19/2016  . Chronic pain 01/19/2016  . Other dysphagia 12/21/2015  . Esophageal dysmotility 12/21/2015    Past Surgical History:  Procedure Laterality Date  . ABDOMINAL HYSTERECTOMY    . GALLBLADDER SURGERY    . THYROIDECTOMY      OB History    No data available       Home Medications    Prior to Admission medications   Medication Sig Start  Date End Date Taking? Authorizing Provider  acetaminophen-codeine (TYLENOL #3) 300-30 MG tablet Take 1 tablet by mouth every 6 (six) hours as needed for moderate pain. 01/19/16  Yes Tommie Sams, DO  amitriptyline (ELAVIL) 75 MG tablet Take 2 tablets (150 mg total) by mouth at bedtime. 02/13/16  Yes Tommie Sams, DO  aspirin 81 MG chewable tablet Chew 81 mg by mouth daily.   Yes Historical Provider, MD  Cholecalciferol (VITAMIN D) 2000 units CAPS Take by mouth daily.   Yes Historical Provider, MD  gabapentin (NEURONTIN) 100 MG capsule Take 1 capsule (100 mg total) by mouth 3 (three) times daily. 01/19/16  Yes Jayce G Cook, DO  insulin NPH Human (HUMULIN N,NOVOLIN N) 100 UNIT/ML injection 30 units every morning and 10 units at night 02/13/16  Yes Jayce G Cook, DO  insulin regular (NOVOLIN R,HUMULIN R) 250 units/2.36mL (100 units/mL) injection Inject 0.06 mLs (6 Units total) into the skin 3 (three) times daily before meals. Patient taking differently: Inject 6 Units into the skin 3 (three) times daily before meals. Hold if bs is less than 100 02/13/16  Yes Tommie Sams, DO  levothyroxine (SYNTHROID, LEVOTHROID) 175 MCG tablet Take 1 tablet (175 mcg total) by mouth daily before breakfast. 01/22/16  Yes Tommie Sams, DO  lisinopril (PRINIVIL,ZESTRIL) 10 MG tablet Take 1 tablet (10 mg total) by mouth daily. 01/19/16  Yes Jayce G  Cook, DO  omeprazole (PRILOSEC) 40 MG capsule Take 1 capsule (40 mg total) by mouth daily. 01/19/16  Yes Tommie Sams, DO  pyridOXINE (VITAMIN B-6) 100 MG tablet Take 100 mg by mouth daily.   Yes Historical Provider, MD  riboflavin (VITAMIN B-2) 100 MG TABS tablet Take 100 mg by mouth daily.   Yes Historical Provider, MD  simvastatin (ZOCOR) 40 MG tablet Take 40 mg by mouth daily.   Yes Historical Provider, MD  glucose blood (ONETOUCH VERIO) test strip USE AS INSTRUCTED to check sugars up to four times daily. E11.9 02/21/16   Tommie Sams, DO  INSULIN SYRINGE 1CC/29G 29G X 1/2" 1 ML MISC As  directed to administer insulin 02/21/16   Tommie Sams, DO  linagliptin (TRADJENTA) 5 MG TABS tablet Take 1 tablet (5 mg total) by mouth daily. Patient not taking: Reported on 03/01/2016 02/21/16   Tommie Sams, DO  methocarbamol (ROBAXIN) 500 MG tablet Take 1 tablet (500 mg total) by mouth every 8 (eight) hours as needed for muscle spasms. Patient not taking: Reported on 03/01/2016 02/21/16   Tommie Sams, DO    Family History Family History  Problem Relation Age of Onset  . Diabetes Mother   . Arthritis Mother   . Hyperlipidemia Mother   . Mental illness Mother   . Heart disease Father   . Mental illness Sister   . Arthritis Maternal Grandmother   . Arthritis Maternal Grandfather     Social History Social History  Substance Use Topics  . Smoking status: Never Smoker  . Smokeless tobacco: Never Used  . Alcohol use No     Allergies   Pravastatin   Review of Systems Review of Systems  Neurological: Positive for dizziness and headaches.  All other systems reviewed and are negative.    Physical Exam Updated Vital Signs BP (!) 154/63   Pulse 74   Resp (!) 22   Ht 5\' 6"  (1.676 m)   Wt 158 lb (71.7 kg)   SpO2 99%   BMI 25.50 kg/m   Physical Exam  Constitutional: She is oriented to person, place, and time.  Chronically ill, slightly dehydrated   HENT:  Head: Normocephalic.  MM slightly dry  Eyes: EOM are normal. Pupils are equal, round, and reactive to light.  Neck: Normal range of motion. Neck supple.  Cardiovascular: Normal rate, regular rhythm and normal heart sounds.   Pulmonary/Chest: Effort normal.  Mild crackles R base   Abdominal: Soft. Bowel sounds are normal. She exhibits no distension. There is no tenderness.  Musculoskeletal: Normal range of motion.  Dec ROM L hip, no obvious deformity.   Neurological: She is alert and oriented to person, place, and time. No cranial nerve deficit. Coordination normal.  Skin: Skin is warm.  Psychiatric: She has a normal  mood and affect.  Nursing note and vitals reviewed.    ED Treatments / Results  Labs (all labs ordered are listed, but only abnormal results are displayed) Labs Reviewed  CBC WITH DIFFERENTIAL/PLATELET - Abnormal; Notable for the following:       Result Value   Neutro Abs 7.5 (*)    Monocytes Absolute 1.1 (*)    All other components within normal limits  COMPREHENSIVE METABOLIC PANEL - Abnormal; Notable for the following:    Sodium 129 (*)    Chloride 93 (*)    Glucose, Bld 283 (*)    BUN 28 (*)    Creatinine, Ser 1.35 (*)  Calcium 8.5 (*)    GFR calc non Af Amer 35 (*)    GFR calc Af Amer 40 (*)    All other components within normal limits  URINALYSIS, COMPLETE (UACMP) WITH MICROSCOPIC - Abnormal; Notable for the following:    Color, Urine YELLOW (*)    APPearance CLEAR (*)    Leukocytes, UA MODERATE (*)    Squamous Epithelial / LPF 0-5 (*)    All other components within normal limits  URINE CULTURE  TROPONIN I  TSH    EKG  EKG Interpretation None      ED ECG REPORT I, Richardean Canal, the attending physician, personally viewed and interpreted this ECG.   Date: 03/01/2016  EKG Time: 9:39 am  Rate: 73  Rhythm: normal EKG, normal sinus rhythm  Axis: normal  Intervals:none  ST&T Change: nonspecific    Radiology Dg Chest 2 View  Result Date: 03/01/2016 CLINICAL DATA:  Cough and weakness EXAM: CHEST  2 VIEW COMPARISON:  None FINDINGS: The heart size and mediastinal contours are within normal limits. Aortic atherosclerosis noted. Both lungs are clear. Chronic left posterior rib fracture deformities identified. IMPRESSION: No active cardiopulmonary disease. Electronically Signed   By: Signa Kell M.D.   On: 03/01/2016 10:42   Ct Head Wo Contrast  Result Date: 03/01/2016 CLINICAL DATA:  Headache, dizziness, nausea, vomiting. EXAM: CT HEAD WITHOUT CONTRAST TECHNIQUE: Contiguous axial images were obtained from the base of the skull through the vertex without  intravenous contrast. COMPARISON:  None. FINDINGS: Brain: There is atrophy and chronic small vessel disease changes. No acute intracranial abnormality. Specifically, no hemorrhage, hydrocephalus, mass lesion, acute infarction, or significant intracranial injury. Vascular: No hyperdense vessel or unexpected calcification. Skull: No acute calvarial abnormality. Sinuses/Orbits: Visualized paranasal sinuses and mastoids clear. Orbital soft tissues unremarkable. Other: None IMPRESSION: No acute intracranial abnormality. Atrophy, chronic microvascular disease. Electronically Signed   By: Charlett Nose M.D.   On: 03/01/2016 10:33   Dg Hip Unilat W Or Wo Pelvis 2-3 Views Left  Result Date: 03/01/2016 CLINICAL DATA:  Left hip pain since a fall 1 month ago. Pain worsened this week. Initial encounter. EXAM: DG HIP (WITH OR WITHOUT PELVIS) 2-3V LEFT COMPARISON:  None. FINDINGS: No acute bony or joint abnormality is seen. Mild bilateral hip degenerative change is identified. Lower lumbar spondylosis is partially imaged. No focal bony lesion. Soft tissues are unremarkable. IMPRESSION: No acute abnormality. Mild to moderate bilateral hip osteoarthritis. Lower lumbar spondylosis. Electronically Signed   By: Drusilla Kanner M.D.   On: 03/01/2016 10:26    Procedures Procedures (including critical care time)  Medications Ordered in ED Medications  sodium chloride 0.9 % bolus 1,000 mL (1,000 mLs Intravenous New Bag/Given 03/01/16 1113)  metoCLOPramide (REGLAN) injection 10 mg (10 mg Intravenous Given 03/01/16 1113)  diphenhydrAMINE (BENADRYL) injection 12.5 mg (12.5 mg Intravenous Given 03/01/16 1113)  cefTRIAXone (ROCEPHIN) IVPB 1 g (1 g Intravenous Given 03/01/16 1159)  traMADol (ULTRAM) tablet 50 mg (50 mg Oral Given 03/01/16 1212)     Initial Impression / Assessment and Plan / ED Course  I have reviewed the triage vital signs and the nursing notes.  Pertinent labs & imaging results that were available during my  care of the patient were reviewed by me and considered in my medical decision making (see chart for details).     Ash[place] Mae Bitterman is a 83 y.o. female here with nausea, dizziness, headaches, hip pain. Likely pneumonia vs UTI vs viral syndrome. Low suspicion  for posterior circulation stroke. Will get labs, CT head, CXR, UA. Will hydrate and reassess.   12:58 PM UA + UTI. Given rocephin, urine culture sent. WBC nl. Na 129, BUN 28, Cr 1.35 likely from dehydration. CT head, CXR unremarkable. Given rocephin, will dc home with keflex. Given strict precautions. Will give tramadol for pain. Family request lidocaine patch as well.    Final Clinical Impressions(s) / ED Diagnoses   Final diagnoses:  None    New Prescriptions New Prescriptions   No medications on file     Charlynne Panderavid Hsienta Shamere Dilworth, MD 03/01/16 1259    Charlynne Panderavid Hsienta Gentry Pilson, MD 03/01/16 [phone]

## 2016-03-01 NOTE — Telephone Encounter (Signed)
Left message on machine to call back  

## 2016-03-01 NOTE — ED Triage Notes (Signed)
Pt to ed with c/o headache dizziness, nausea and vomiting that began Monday. Pt also reports that she fell approx 1 month ago and since then has been having left hip pain.

## 2016-03-01 NOTE — Discharge Instructions (Signed)
Take keflex three times daily for  a week.   Take tramadol for pain.   Use lidocaine patch as needed for hip pain   See your doctor.   Your sodium is 129, repeat in a week with your doctor  Return to ER if you have fever, vomiting, trouble breathing, worse dizziness, dehydration, fevers.

## 2016-03-01 NOTE — ED Notes (Signed)
MD at bedside. 

## 2016-03-02 ENCOUNTER — Encounter: Payer: Self-pay | Admitting: Family Medicine

## 2016-03-02 ENCOUNTER — Ambulatory Visit (INDEPENDENT_AMBULATORY_CARE_PROVIDER_SITE_OTHER): Payer: Medicare HMO | Admitting: Family Medicine

## 2016-03-02 VITALS — BP 121/73 | HR 77 | Temp 98.3°F | Wt 157.4 lb

## 2016-03-02 DIAGNOSIS — Z8719 Personal history of other diseases of the digestive system: Secondary | ICD-10-CM | POA: Insufficient documentation

## 2016-03-02 DIAGNOSIS — M1612 Unilateral primary osteoarthritis, left hip: Secondary | ICD-10-CM | POA: Diagnosis not present

## 2016-03-02 DIAGNOSIS — N3000 Acute cystitis without hematuria: Secondary | ICD-10-CM

## 2016-03-02 DIAGNOSIS — N390 Urinary tract infection, site not specified: Secondary | ICD-10-CM | POA: Insufficient documentation

## 2016-03-02 LAB — URINE CULTURE

## 2016-03-02 MED ORDER — LIDOCAINE 5 % EX PTCH: 1.0000 | MEDICATED_PATCH | CUTANEOUS | 0 refills | Status: DC

## 2016-03-02 NOTE — Progress Notes (Signed)
Subjective:  Patient ID: Amy Calhoun, female    DOB: Aug 02, 1928  Age: 83 y.o. MRN: [id number]  CC: ED follow up, Hip pain  HPI:  83 year old female with hypertension, uncontrolled diabetes with complications, CKD, hyperlipidemia presents for ED follow-up. She also complains of left hip pain.  ED follow-up  Patient has recently been experiencing nausea, vomiting, dizziness.  She was seen in the ED on 2/15 and was diagnosed with UTI.  She was placed on Keflex.  Patient states that she is improved at this time.  No further nausea or vomiting.  Endorses compliance with anabolic.  Left hip pain  Chronic, with recent worsening.  Intermittently severe.  Reports pain in the growing as well as the buttock and lateral thigh.  Daughter states that it impacts her mobility.  She has used tramadol and Tylenol 3 without symptom improvement. They have recently been using lidocaine patches with some improvement.  Daughter would like a prescription for this today.  She also wants to discuss other treatment options.  Social Hx   Social History   Social History  . Marital status: Widowed    Spouse name: N/A  . Number of children: N/A  . Years of education: N/A   Occupational History  . retired    Social History Main Topics  . Smoking status: Never Smoker  . Smokeless tobacco: Never Used  . Alcohol use No  . Drug use: No  . Sexual activity: No   Other Topics Concern  . None   Social History Narrative  . None    Review of Systems  Constitutional: Negative for fever.  Gastrointestinal: Positive for nausea and vomiting.  Musculoskeletal:       Left hip pain.   Objective:  BP 121/73 (BP Location: Left Arm, Patient Position: Sitting, Cuff Size: Normal)   Pulse 77   Temp 98.3 F (36.8 C) (Oral)   Wt 157 lb 6.4 oz (71.4 kg)   SpO2 96%   BMI 25.41 kg/m   BP/Weight 03/02/2016 03/01/2016 03/01/2016  Systolic BP 121 160 138  Diastolic BP 73 74 46  Wt. (Lbs)  157.4 158 158  BMI 25.41 25.5 25.5   Physical Exam  Constitutional: She appears well-developed. No distress.  Cardiovascular: Normal rate and regular rhythm.   Murmur heard. Pulmonary/Chest: Effort normal.  Abdominal: Soft. She exhibits no distension. There is no tenderness.  Musculoskeletal:  Left hip - tenderness of the greater trochanter. Decreased range of motion, particularly with internal and external rotation.  Neurological: She is alert.  Vitals reviewed.   Lab Results  Component Value Date   WBC 11.0 03/01/2016   HGB 12.5 03/01/2016   HCT 36.8 03/01/2016   PLT 239 03/01/2016   GLUCOSE 283 (H) 03/01/2016   CHOL 151 01/19/2016   TRIG 110.0 01/19/2016   HDL 59.30 01/19/2016   LDLCALC 70 01/19/2016   ALT 16 03/01/2016   AST 22 03/01/2016   NA 129 (L) 03/01/2016   K 4.4 03/01/2016   CL 93 (L) 03/01/2016   CREATININE 1.35 (H) 03/01/2016   BUN 28 (H) 03/01/2016   CO2 26 03/01/2016   TSH 3.720 03/01/2016   HGBA1C 8.9 (H) 01/19/2016    Assessment & Plan:   Problem List Items Addressed This Visit    UTI (urinary tract infection) - Primary    New problem. Improving. Continue antibiotic.      Primary osteoarthritis of left hip    New problem. Recent xray with moderate OA. Sending to  [facility] for consideration for US guided injection. PRN tramadol, lidocaine patch.      Relevant Orders   Ambulatory referral to Sports Medicine      Meds ordered this encounter  Medications  . lidocaine (LIDODERM) 5 %    Sig: Place 1 patch onto the skin daily.    Dispense:  30 patch    Refill:  0   Follow-up: PRN  Everlene OtherJayce Aravind Chrismer DO [facility] [place]

## 2016-03-02 NOTE — Telephone Encounter (Signed)
Ok, thanks.

## 2016-03-02 NOTE — Patient Instructions (Signed)
We'll send in the lidocaine.  We'll arrange the referral.  Continue the medications.  Take care  Dr. Adriana Simasook

## 2016-03-02 NOTE — Assessment & Plan Note (Signed)
New problem. Recent xray with moderate OA. [facility] PRN tramadol, lidocaine patch.

## 2016-03-02 NOTE — Telephone Encounter (Signed)
The pt states she is not having any problems with dysphagia at this time.  She will call if her symptoms return

## 2016-03-02 NOTE — Assessment & Plan Note (Signed)
New problem. Improving. Continue antibiotic.

## 2016-03-07 ENCOUNTER — Other Ambulatory Visit: Payer: Self-pay | Admitting: Family Medicine

## 2016-03-07 ENCOUNTER — Other Ambulatory Visit (INDEPENDENT_AMBULATORY_CARE_PROVIDER_SITE_OTHER): Payer: Medicare HMO

## 2016-03-07 DIAGNOSIS — E538 Deficiency of other specified B group vitamins: Secondary | ICD-10-CM

## 2016-03-07 DIAGNOSIS — E871 Hypo-osmolality and hyponatremia: Secondary | ICD-10-CM | POA: Diagnosis not present

## 2016-03-07 LAB — BASIC METABOLIC PANEL
BUN: 22 mg/dL (ref 6–23)
CALCIUM: 8.8 mg/dL (ref 8.4–10.5)
CHLORIDE: 103 meq/L (ref 96–112)
CO2: 30 meq/L (ref 19–32)
CREATININE: 1.31 mg/dL — AB (ref 0.40–1.20)
GFR: 40.97 mL/min — ABNORMAL LOW (ref >60.00)
Glucose, Bld: 84 mg/dL (ref 70–99)
Potassium: 4.8 mEq/L (ref 3.5–5.1)
SODIUM: 138 meq/L (ref 135–145)

## 2016-03-07 LAB — VITAMIN B12: Vitamin B-12: 658 pg/mL (ref 211–911)

## 2016-03-07 NOTE — Addendum Note (Signed)
Addended by: Warden FillersWRIGHT, Youssef Footman S on: 03/07/2016 02:25 PM   Modules accepted: Orders

## 2016-03-08 DIAGNOSIS — R69 Illness, unspecified: Secondary | ICD-10-CM | POA: Diagnosis not present

## 2016-03-09 ENCOUNTER — Telehealth: Payer: Self-pay | Admitting: Family Medicine

## 2016-03-09 NOTE — Telephone Encounter (Signed)
A PA was created for tradjenta and sent to insurance for approval.

## 2016-03-09 NOTE — Telephone Encounter (Signed)
PA approved by insurance. °

## 2016-03-12 NOTE — Telephone Encounter (Signed)
03/12/16  Received weekly CBG log from patients daughter (see below).  Daughter reports Jearld Leschradjenta was rejected by patients insurance. Per CBG log patient has not had hypoglycemic episodes.  Attempted calling patient to followup CBGs put patients daughter states she will return my call.  Per chart review Tradjenta PA was approved.  Will followup with patient this week and initiate Tradjenta.    Hazle NordmannKelsy Combs, PharmD, BCPS [facility] PGY2 Pharmacy Resident [phone]

## 2016-03-13 MED ORDER — GLUCAGON (RDNA) 1 MG IJ KIT: 1.0000 mg | PACK | Freq: Once | INTRAMUSCULAR | 3 refills | Status: DC | PRN

## 2016-03-13 NOTE — Telephone Encounter (Signed)
Okay with me 

## 2016-03-13 NOTE — Telephone Encounter (Signed)
03/13/16  Pharmacy clinic followup scheduled for next Monday.  Patients daughter notified that PA for Jearld Leschradjenta has been approved.  Daughter is also requesting glucagon injections for patient.  Dr Adriana Simasook are you okay with this?

## 2016-03-13 NOTE — Telephone Encounter (Signed)
03/13/16  Order for glucagon has been sent to patients pharmacy per Dr Adriana Simasook.

## 2016-03-14 ENCOUNTER — Telehealth: Payer: Self-pay | Admitting: Family Medicine

## 2016-03-14 NOTE — Telephone Encounter (Signed)
A PA was created for lidocaine 5% ADH patch. This was approved for the dates of 02/05/16-03/06/2017

## 2016-03-16 ENCOUNTER — Ambulatory Visit: Payer: Self-pay

## 2016-03-16 ENCOUNTER — Other Ambulatory Visit: Payer: Self-pay

## 2016-03-16 ENCOUNTER — Encounter: Payer: Self-pay | Admitting: Sports Medicine

## 2016-03-16 ENCOUNTER — Ambulatory Visit (INDEPENDENT_AMBULATORY_CARE_PROVIDER_SITE_OTHER): Payer: Medicare HMO | Admitting: Sports Medicine

## 2016-03-16 VITALS — BP 108/52 | HR 76 | Ht 66.0 in | Wt 158.4 lb

## 2016-03-16 DIAGNOSIS — M7062 Trochanteric bursitis, left hip: Secondary | ICD-10-CM

## 2016-03-16 DIAGNOSIS — M1612 Unilateral primary osteoarthritis, left hip: Secondary | ICD-10-CM

## 2016-03-16 DIAGNOSIS — M25552 Pain in left hip: Secondary | ICD-10-CM | POA: Diagnosis not present

## 2016-03-16 DIAGNOSIS — M47816 Spondylosis without myelopathy or radiculopathy, lumbar region: Secondary | ICD-10-CM

## 2016-03-16 DIAGNOSIS — M4726 Other spondylosis with radiculopathy, lumbar region: Secondary | ICD-10-CM | POA: Insufficient documentation

## 2016-03-16 NOTE — Progress Notes (Signed)
Amy Calhoun - 83 y.o. female MRN [id number]  Date of birth: 07/07/1928  Office Visit Note: Visit Date: 03/16/2016 PCP: Tommie SamsJayce G Cook, DO Referred by: Tommie Samsook, Jayce G, DO  Subjective: Chief Complaint  Patient presents with  . pain in left hip    Pt fell in Jan and sprained left ankle and ever since then she has had hip pain. It hurts to sit, stand, and lay. She had xray that did not show any break. Sitting hurts the most.    HPI: Patient experienced a fall at the end of January which resulted in a left lateral ankle sprain.  Since that time she has had persistent pain in the left lateral hip as well as progressive pain along the right lateral hip.  Pain seems to be located along the bilateral buttock and left posterior thigh.  She has pain at night when laying on this side.  She responded laboratories were discontinued due to abnormal kidney function.  She has any recurrent falls.  Pt denies any change in bowel or bladder habits, muscle weakness, numbness or falls associated with this pain.  She has had issues with her low back for quite some time. ROS:  Otherwise per HPI.  Objective:  VS:  HT:5\' 6"  (167.6 cm)   WT:158 lb 6.4 oz (71.8 kg)  BMI:25.6    BP:(!) 108/52  HR:76bpm  TEMP: ( )  RESP:95 % Physical Exam: GENERAL:  WDWN, NAD, Non-toxic appearing PSYCH:  Alert & appropriately interactive  Not depressed or anxious appearing LOWER EXTREMITIES:  No significant rashes/lesions/ulcerations overlying the legs.  No significant pretibial edema.  No clubbing or cyanosis.  DP & PT pulses 2+/4.  Sensation intact to light touch.  BACK AND LEFT HIP:  Exaggerated thoracic kyphosis.  We will good alignment of the hips and knees.  She walks with a slight Trendelenburg gait.  Good range of motion of bilateral hips with internal and external rotation.  No pain with logroll, FADIR or FABER.  Only minimal symptoms with Stinchfield testing. Marked tenderness to palpation over the left  greater trochanter.  Minimal pain over the midline of the lumbar spine.  Small amount pain to palpation of the left  and PSIS and sacral base. She is moderately weak with leg extension, knee extension and hip adduction test. Minimal pain with popliteal compression test and greater sciatic notch palpation.  Negative straight leg raise.  Imaging & Procedures: No results found. PROCEDURE NOTE: ULTRASOUND GUIDED LEFT GREATER TROCHANTERICINJECTION  Images were obtained and interpreted by myself, Gaspar BiddingMichael Corion Sherrod, DO  Images have been saved and stored to PACS system. Images obtained on: GE S7 Ultrasound machine  DESCRIPTION OF PROCEDURE:  The patient's clinical condition is marked by substantial pain and/or significant functional disability. Other conservative therapy has not provided relief, is contraindicated, or not appropriate. There is a reasonable likelihood that injection will significantly improve the patient's pain and/or functional impairment. After discussing the risks, benefits and expected outcomes of the injection and all questions were reviewed and answered, the patient wished to undergo the above named procedure. Verbal consent was obtained. The ultrasound was used to identify the target structure and adjacent neurovascular structures. The skin was then prepped in sterile fashion and the target structure was injected under direct visualization using sterile technique as below: PREP: Alcohol, Ethel Chloride APPROACH: lateral, Sterile Exchange technique, 22g 3.5" needle INJECTATE: 3cc 1% lidocaine, 1cc 40mg  DepoMedrol ASPIRATE: N/A DRESSING: Band-Aid  Post procedural instructions including recommending icing and warning  signs for infection were reviewed. This procedure was well tolerated and there were no complications.   IMPRESSION: Succesful US Guided Injection of the left greater trochanter   PROCEDURE NOTE: THERAPEUTIC EXERCISES (97110) 15 minutes spent for Therapeutic  exercises as stated in above notes.  This included exercises focusing on stretching, strengthening, with significant focus on eccentric aspects.   Proper technique shown and discussed handout in great detail with ATC.  All questions were discussed and answered.    Assessment & Plan: Problem List Items Addressed This Visit    Primary osteoarthritis of left hip    She does have some underlying mild arthritis on x-ray however minimal pain with range of motion testing.  Symptoms are more consistent with a greater trochanteric tendinopathy/bursitis.      Left hip pain - Primary      Subtle density with greater trochanter and off with weakness.  We will had never began working of the neck extensors, hi knee knee extensors, hip extensors, hip  aBductors       Relevant Orders   US Guided Needle Placement   Spondylosis of lumbar joint    Moderate degree of L5-S1 spondylosis appreciated on pelvic x-ray.  if the lack of improvement consider further evaluation of her lumbar spine.       Other Visit Diagnoses    Greater trochanteric bursitis of left hip          Follow-up: Return in about 4 weeks (around 04/13/2016) for repeat clinical exam.   Past Medical/Family/Surgical/Social History: Medications & Allergies reviewed per EMR Patient Active Problem List   Diagnosis Date Noted  . Left hip pain 03/16/2016  . Spondylosis of lumbar joint 03/16/2016  . History of colitis 03/02/2016  . UTI (urinary tract infection) 03/02/2016  . Primary osteoarthritis of left hip 03/02/2016  . CKD (chronic kidney disease) stage 3, GFR 30-59 ml/min 02/21/2016  . DM type 2 with diabetic peripheral neuropathy (HCC) 01/19/2016  . Hyperlipidemia 01/19/2016  . Hypothyroidism 01/19/2016  . Essential hypertension 01/19/2016  . Chronic pain 01/19/2016  . Other dysphagia 12/21/2015  . Esophageal dysmotility 12/21/2015   Past Medical History:  Diagnosis Date  . Depression   . Esophageal dysphagia   . Gastric  ulcer   . Hyperlipidemia   . Hypertension   . Hypothyroidism   . IDDM (insulin dependent diabetes mellitus) (HCC)   . Ischemic colitis (HCC)   . Osteoarthritis   . Ulcer of esophagus    Family History  Problem Relation Age of Onset  . Diabetes Mother   . Arthritis Mother   . Hyperlipidemia Mother   . Mental illness Mother   . Heart disease Father   . Mental illness Sister   . Arthritis Maternal Grandmother   . Arthritis Maternal Grandfather    Past Surgical History:  Procedure Laterality Date  . ABDOMINAL HYSTERECTOMY    . GALLBLADDER SURGERY    . THYROIDECTOMY     Social History   Occupational History  . retired    Social History Main Topics  . Smoking status: Never Smoker  . Smokeless tobacco: Never Used  . Alcohol use No  . Drug use: No  . Sexual activity: No

## 2016-03-16 NOTE — Assessment & Plan Note (Signed)
Moderate degree of L5-S1 spondylosis appreciated on pelvic x-ray.  if the lack of improvement consider further evaluation of her lumbar spine.

## 2016-03-16 NOTE — Assessment & Plan Note (Signed)
Subtle density with greater trochanter and off with weakness.  We will had never began working of the neck extensors, hi knee knee extensors, hip extensors, hip  aBductors

## 2016-03-16 NOTE — Patient Instructions (Signed)
Please perform the exercise program that Amy Calhoun has prepared for you and gone over in detail on a daily basis.  In addition to the handout you were provided you can access your program through: [email or web address]   Your unique program code is: [email or web address]

## 2016-03-16 NOTE — Assessment & Plan Note (Signed)
She does have some underlying mild arthritis on x-ray however minimal pain with range of motion testing.  Symptoms are more consistent with a greater trochanteric tendinopathy/bursitis.

## 2016-03-19 ENCOUNTER — Telehealth: Payer: Self-pay | Admitting: Pharmacist

## 2016-03-19 ENCOUNTER — Ambulatory Visit: Payer: Medicare HMO | Admitting: Pharmacist

## 2016-03-19 DIAGNOSIS — E1142 Type 2 diabetes mellitus with diabetic polyneuropathy: Secondary | ICD-10-CM

## 2016-03-19 NOTE — Assessment & Plan Note (Signed)
Diabetes currently uncontrolled with improved but variable CBGs.  Patients daughter reports 1 hypoglycemic episode but states proper treatment knowledge and has glucagon on hand for severe hypoglycemia. Lack of control likely due to use of NPH/regular insulin regimen per patient/daughter request and variable patient diet throughout the day with periods of low appetite and high carbohydrate intake.   Following discussion and approval by Dr Adriana Simasook, the following medication changes were made:  -Per [facility] pharmacy patient picked up 90 day supply of Tradjenta on 03/16/16 -Started Tradjenta 5 mg daily (not renally dosed) which was previously prescribed by Dr Adriana Simasook but never started. -Decreased Humulin N to 25 units every morning and 10 units every evening -Continued Humulin R 6 units TID before meals.  Instructed patients daughter to hold insulin for CBGs < 100 mg/dL and if patient not eating that meal -Pharmacist will followup via telephone in 1 week.

## 2016-03-19 NOTE — Telephone Encounter (Signed)
03/19/16  Received call from patients daughter who believed today's visit was a home visit not a clinic visit.  Daughter reports she called EMS last week due to patient having a CBG of 55 mg/dL in which patient was not wanting to eat/drink anything to correct hypoglycemic episode. Daughter requested glucagon injection due to this incident and was scared for patient to start Tradjenta after speaking with her pharmacist due to risk of hypoglycemia.  Patient also states pharmacy was unable to process Tradjenta despite PA being approved last week. Daughter also reports hypoglycemia episode may be due to patient not having an appetite after lunch.    Fasting CBGs: 104, 192, 163, 158, 200, 201, 218, 388 Before Lunch CBGs: 131, 356, 223, 233, 232, 217, 81 Before Supper CBGs: 55, 123, 225, 242, 263, 173, 169 HS CBGs: 128, 93, 242, 159, 204, 192, 137   A/P: Diabetes currently uncontrolled with improved but variable CBGs.  Patients daughter reports 1 hypoglycemic episode but states proper treatment knowledge and has glucagon on hand for severe hypoglycemia. Lack of control likely due to use of NPH/regular insulin regimen per patient/daughter request and variable patient diet throughout the day with periods of low appetite and high carbohydrate intake.   Following discussion and approval by Dr Adriana Simasook, the following medication changes were made:  -Per [facility] patient picked up 90 day supply of Tradjenta on 03/16/16 -Started Tradjenta 5 mg daily (not renally dosed) which was previously prescribed by Dr Adriana Simasook but never started. -Decreased Humulin N to 25 units every morning and 10 units every evening -Continued Humulin R 6 units TID before meals.  Instructed patients daughter to hold insulin for CBGs < 100 mg/dL and if patient not eating that meal -Pharmacist will followup via telephone in 1 week.  Hazle NordmannKelsy Safir Michalec, PharmD, BCPS [facility] PGY2 Pharmacy Resident [phone]

## 2016-03-20 ENCOUNTER — Telehealth: Payer: Self-pay

## 2016-03-20 ENCOUNTER — Other Ambulatory Visit: Payer: Self-pay

## 2016-03-20 ENCOUNTER — Ambulatory Visit (INDEPENDENT_AMBULATORY_CARE_PROVIDER_SITE_OTHER): Payer: Medicare HMO

## 2016-03-20 DIAGNOSIS — M25551 Pain in right hip: Secondary | ICD-10-CM

## 2016-03-20 DIAGNOSIS — M5136 Other intervertebral disc degeneration, lumbar region: Secondary | ICD-10-CM | POA: Diagnosis not present

## 2016-03-20 DIAGNOSIS — M25552 Pain in left hip: Secondary | ICD-10-CM

## 2016-03-20 MED ORDER — GABAPENTIN 100 MG PO CAPS: 200.0000 mg | ORAL_CAPSULE | Freq: Three times a day (TID) | ORAL | Status: DC

## 2016-03-20 NOTE — Telephone Encounter (Signed)
I am happy to see her back if they would like.  Otherwise I am concerned that this may indicate that she is having an issue coming from the nerves in her back.  I would like for them to go ahead and get a 2 View X-ray of her lumbar spine and this could be done where ever is easiest for them.  I would also like her to increase her gabapentin to 2 tablets three times per day.  She should be on 1 tab tid currently.  I will plan to follow up with them once I see the X-ray

## 2016-03-20 NOTE — Telephone Encounter (Signed)
Called Emma and advised. [facility] [place] [facility] but if she cannot get her there today she will take her Thursday. She will also have her increase her Gabapentin to 2 tablets three times daily. Order has been entered.

## 2016-03-20 NOTE — Telephone Encounter (Signed)
Daughter [name] calling because her mother came in Friday and got an injection. She rested all day Saturday and reported that the pain was about a 1. The pain was OK Sunday too. Monday she started to complain about increased pain in her left hip. Today she says that there are sharp achy pains in both hips. She says that it is the same kind of pain she was having before. She tried Ibuprofen 200 mg x 4 with some relief. She also tried Tylenol Arthritis but says that her pain is still at about a 5. Her daughter wants to know what is going on, if she needs to come back in to be re-evaluated or why the relief was so short. She had been in pain for quite a while before the injection. [name] has been putting icy hot on her hips and using the patches and she has gotten some relief from that. She knows that today is a cold rainy day and doesn't know if it could be related to that. She says the pain was a 10 when she originally woke up before taking the Ibuprofen. [phone].

## 2016-03-21 ENCOUNTER — Telehealth: Payer: Self-pay | Admitting: Sports Medicine

## 2016-03-21 MED ORDER — PREDNISONE 20 MG PO TABS: 20.0000 mg | ORAL_TABLET | Freq: Every day | ORAL | 0 refills | Status: DC

## 2016-03-21 NOTE — Addendum Note (Signed)
Addended by: Gaspar BiddingIGBY, Lathon Adan D on: 03/21/2016 04:55 PM   Modules accepted: Orders

## 2016-03-21 NOTE — Telephone Encounter (Signed)
Spoke with Amy MeadEmma, she got her mom to have the xray yesterday. She says that she is still in a lot of pain and would appreciate a call back by the end of the day today.

## 2016-03-21 NOTE — Telephone Encounter (Signed)
Patient's daughter [name] called to speak with CMA about patient (mother) and update on patient's condition. Please call daughter back at [phone].

## 2016-03-21 NOTE — Telephone Encounter (Signed)
Called patient and her caretaker [name] who both report that she is continuing to have some pain but significant better today than yesterday.  She reported having 10/10 pain this morning however improved with Tylenol and double dose gabapentin.  Given the findings on x-ray of the lumbar spine we discussed using a reduced dose of systemic steroid and caution on CBGs.  If any significant elevations she will plan to call to have insulin titrated as well as discuss whether or not we need to discontinue this.  If any lack of improvement she will need to have an MRI of the lumbar spine and consideration of epidural steroid injection.

## 2016-03-23 ENCOUNTER — Telehealth: Payer: Self-pay | Admitting: Sports Medicine

## 2016-03-23 NOTE — Telephone Encounter (Signed)
[name] (patient's daughter) [phone]  Patient's daughter called to advise that her blood sugar is high and 550 and she recovered with taking her insulin and it came down to 200. Her pain is at a 1-2 and she has high energy. She is doing much better.

## 2016-03-23 NOTE — Telephone Encounter (Signed)
They should continue with sliding scale insulin but I am glad that she is doing better.  No other changes at this time.

## 2016-03-23 NOTE — Telephone Encounter (Signed)
Spoke with Kara MeadEmma and she just wanted to make Dr. Berline Choughigby aware. She says that her mom is doing well, sleeping better and has more energy. Her sugars are fluctuating but they are adjusting the insulin as needed. They will contact us if there are any issues between now and Wednesday. Will forward to Dr. Berline Choughigby as Lorain ChildesFYI.

## 2016-03-26 ENCOUNTER — Telehealth: Payer: Self-pay | Admitting: Pharmacist

## 2016-03-26 NOTE — Telephone Encounter (Signed)
03/26/16  Received call and CBG log from patients daughter.  Daughter states patient has been started on prednisone 20 mg daily x 7 days last week by Dr Berline Choughigby.  She states patient has had trouble sleeping and remembering things with her memory since starting the Prednisone.  She states patient has been self adjusting her insulin regimen and taking increased doses of Regular and NPH without her or a provider instructing her to do so.  When daughter arrived at patients home today she had a CBG of 77 mg/dL after taking an extra dose of NPH 10 units after lunch.  Daughter was hesitant to complicate regimen due to patients trouble remembering with simple sliding scale regimen.  Daughter has been trying to reach Dr Maryln Gottronigbys office but has been unable to reach them given the early closure due to snow.      Assessment/Plan: Diabetes: currently uncontrolled with increased CBGs since starting prednisone.  Patient at high risk for hypoglycemia given use of NPH/regular insulin regimen, self adjustment of insulin with high doses of NPH/Regular, age and variable meals throughout the day. Patient also experiencing steroid induced hyperglycemia which has decreased but also experiencing steroid induced CNS effects including insomnia/memory issues.    -Discussed importance of not self adjusting insulin regimen with daughter and patient due to risk of hypoglycemia.  Instructed them that the steroid is causing hyperglycemia and the risk of hypoglycemia with increasing doses of Regular and NPH insulin especially with NPH several times per day and Regular insulin doses as high as 20 units.  -Instructed patient/daughter to go back to NPH 25 units QAM and 10 units QPM (skip tonight's dose due to already taking dose this afternoon). -Continue Insulin Regular 6 units before meals (am hesitant to start sliding scale due to patients confusion/memory at this time. Would consider simple sliding scale such as 6 units + 3 units if CBGs >  250 but patients daughter thought this may be too confusing at this time) -Instructed patient that once prednisone has stopped she her CBGs should start to normalize and to call clinic/[facility] pharmacist cell phone if she has CBGs > 400 mg/dL  -Patient to call Dr Maryln Gottronigbys office tomorrow regarding the prednisone.   Hazle NordmannKelsy Combs, PharmD, BCPS [facility] PGY2 Pharmacy Resident [phone]

## 2016-03-27 ENCOUNTER — Telehealth: Payer: Self-pay | Admitting: Family Medicine

## 2016-03-27 MED ORDER — GABAPENTIN 100 MG PO CAPS: 200.0000 mg | ORAL_CAPSULE | Freq: Three times a day (TID) | ORAL | 1 refills | Status: DC

## 2016-03-27 NOTE — Telephone Encounter (Signed)
Recommend discontinuing prednisone at this time.  Sounds like she is essentially asymptomatic from her back and hip standpoint.  We will like for her to continue on the gabapentin at this time and will send in a refill.  If continued confusion will need to decrease this dosing as well.  Insulin regimen per Pharm.D. recommendations yesterday.  I appreciate their expertise on this topic.

## 2016-03-27 NOTE — Telephone Encounter (Signed)
Forwarding to Autumn 

## 2016-03-27 NOTE — Telephone Encounter (Signed)
Daughter "[name]" is calling because the patient is getting confused and not sleeping since being prescribed the prednisone.  She would like a call back to discuss.  Thank you,  -LL

## 2016-03-27 NOTE — Telephone Encounter (Signed)
Care was provided under my supervision. I agree with the management as indicated in the note.  Nemesis Rainwater DO  

## 2016-03-27 NOTE — Telephone Encounter (Signed)
Daughter is aware of annotations.

## 2016-03-27 NOTE — Telephone Encounter (Signed)
Daughter reports increased confusing and lack of sleep x2-3 days now. Last week pt was started on a 7 day course of Prednisone and Increased her Neurontin 100mg  2 tablets TID. Her fasting blood sugar yesterday was 77. Daughter has stressed to patient not to increase her insulin dose bc it is expected for increased blood sugar levels. [facility] the increase dose of Neurontin. Pt has one tablet let of Prednisone---sx have subsided and pt pain has resolved.

## 2016-03-28 ENCOUNTER — Inpatient Hospital Stay
Admission: EM | Admit: 2016-03-28 | Discharge: 2016-03-31 | DRG: 690 | Disposition: A | Discharge status: Home Health | Payer: Medicare HMO | Attending: Internal Medicine | Admitting: Internal Medicine

## 2016-03-28 ENCOUNTER — Emergency Department: Payer: Medicare HMO

## 2016-03-28 ENCOUNTER — Encounter: Payer: Self-pay | Admitting: Emergency Medicine

## 2016-03-28 DIAGNOSIS — R509 Fever, unspecified: Secondary | ICD-10-CM | POA: Diagnosis present

## 2016-03-28 DIAGNOSIS — N12 Tubulo-interstitial nephritis, not specified as acute or chronic: Secondary | ICD-10-CM | POA: Diagnosis not present

## 2016-03-28 DIAGNOSIS — R1314 Dysphagia, pharyngoesophageal phase: Secondary | ICD-10-CM | POA: Diagnosis present

## 2016-03-28 DIAGNOSIS — E785 Hyperlipidemia, unspecified: Secondary | ICD-10-CM | POA: Diagnosis present

## 2016-03-28 DIAGNOSIS — E1142 Type 2 diabetes mellitus with diabetic polyneuropathy: Secondary | ICD-10-CM | POA: Diagnosis present

## 2016-03-28 DIAGNOSIS — I129 Hypertensive chronic kidney disease with stage 1 through stage 4 chronic kidney disease, or unspecified chronic kidney disease: Secondary | ICD-10-CM | POA: Diagnosis present

## 2016-03-28 DIAGNOSIS — I10 Essential (primary) hypertension: Secondary | ICD-10-CM | POA: Diagnosis present

## 2016-03-28 DIAGNOSIS — R0602 Shortness of breath: Secondary | ICD-10-CM | POA: Diagnosis not present

## 2016-03-28 DIAGNOSIS — R0600 Dyspnea, unspecified: Secondary | ICD-10-CM

## 2016-03-28 DIAGNOSIS — Z794 Long term (current) use of insulin: Secondary | ICD-10-CM | POA: Diagnosis not present

## 2016-03-28 DIAGNOSIS — E039 Hypothyroidism, unspecified: Secondary | ICD-10-CM | POA: Diagnosis not present

## 2016-03-28 DIAGNOSIS — K219 Gastro-esophageal reflux disease without esophagitis: Secondary | ICD-10-CM | POA: Diagnosis present

## 2016-03-28 DIAGNOSIS — R079 Chest pain, unspecified: Secondary | ICD-10-CM | POA: Diagnosis not present

## 2016-03-28 DIAGNOSIS — K5900 Constipation, unspecified: Secondary | ICD-10-CM | POA: Diagnosis not present

## 2016-03-28 DIAGNOSIS — E890 Postprocedural hypothyroidism: Secondary | ICD-10-CM | POA: Diagnosis present

## 2016-03-28 DIAGNOSIS — N179 Acute kidney failure, unspecified: Secondary | ICD-10-CM | POA: Diagnosis not present

## 2016-03-28 DIAGNOSIS — N10 Acute tubulo-interstitial nephritis: Principal | ICD-10-CM | POA: Diagnosis present

## 2016-03-28 DIAGNOSIS — R739 Hyperglycemia, unspecified: Secondary | ICD-10-CM | POA: Diagnosis not present

## 2016-03-28 DIAGNOSIS — N183 Chronic kidney disease, stage 3 (moderate): Secondary | ICD-10-CM | POA: Diagnosis present

## 2016-03-28 DIAGNOSIS — Z8711 Personal history of peptic ulcer disease: Secondary | ICD-10-CM | POA: Diagnosis not present

## 2016-03-28 DIAGNOSIS — T380X5A Adverse effect of glucocorticoids and synthetic analogues, initial encounter: Secondary | ICD-10-CM | POA: Diagnosis present

## 2016-03-28 DIAGNOSIS — N189 Chronic kidney disease, unspecified: Secondary | ICD-10-CM

## 2016-03-28 DIAGNOSIS — E860 Dehydration: Secondary | ICD-10-CM | POA: Diagnosis not present

## 2016-03-28 DIAGNOSIS — B9620 Unspecified Escherichia coli [E. coli] as the cause of diseases classified elsewhere: Secondary | ICD-10-CM | POA: Diagnosis present

## 2016-03-28 DIAGNOSIS — Z79899 Other long term (current) drug therapy: Secondary | ICD-10-CM | POA: Diagnosis not present

## 2016-03-28 DIAGNOSIS — E119 Type 2 diabetes mellitus without complications: Secondary | ICD-10-CM | POA: Diagnosis not present

## 2016-03-28 DIAGNOSIS — F329 Major depressive disorder, single episode, unspecified: Secondary | ICD-10-CM | POA: Diagnosis present

## 2016-03-28 DIAGNOSIS — E1165 Type 2 diabetes mellitus with hyperglycemia: Secondary | ICD-10-CM | POA: Diagnosis present

## 2016-03-28 DIAGNOSIS — R109 Unspecified abdominal pain: Secondary | ICD-10-CM | POA: Diagnosis not present

## 2016-03-28 LAB — BASIC METABOLIC PANEL
Anion gap: 10 (ref 5–15)
BUN: 37 mg/dL — AB (ref 6–20)
CALCIUM: 8.2 mg/dL — AB (ref 8.9–10.3)
CO2: 24 mmol/L (ref 22–32)
CREATININE: 1.63 mg/dL — AB (ref 0.44–1.00)
Chloride: 100 mmol/L — ABNORMAL LOW (ref 101–111)
GFR calc Af Amer: 32 mL/min — ABNORMAL LOW (ref >60)
GFR, EST NON AFRICAN AMERICAN: 28 mL/min — AB (ref >60)
Glucose, Bld: 188 mg/dL — ABNORMAL HIGH (ref 65–99)
POTASSIUM: 4.2 mmol/L (ref 3.5–5.1)
SODIUM: 134 mmol/L — AB (ref 135–145)

## 2016-03-28 LAB — TROPONIN I: Troponin I: <0.03 ng/mL (ref <0.03)

## 2016-03-28 LAB — URINALYSIS, COMPLETE (UACMP) WITH MICROSCOPIC
BILIRUBIN URINE: NEGATIVE
GLUCOSE, UA: NEGATIVE mg/dL
Ketones, ur: NEGATIVE mg/dL
NITRITE: POSITIVE — AB
PH: 5 (ref 5.0–8.0)
Protein, ur: 100 mg/dL — AB
SPECIFIC GRAVITY, URINE: 1.011 (ref 1.005–1.030)
Squamous Epithelial / LPF: NONE SEEN

## 2016-03-28 LAB — CBC
HEMATOCRIT: 37.2 % (ref 35.0–47.0)
Hemoglobin: 12.3 g/dL (ref 12.0–16.0)
MCH: 29.5 pg (ref 26.0–34.0)
MCHC: 33.1 g/dL (ref 32.0–36.0)
MCV: 89.2 fL (ref 80.0–100.0)
Platelets: 196 10*3/uL (ref 150–440)
RBC: 4.17 MIL/uL (ref 3.80–5.20)
RDW: 14.4 % (ref 11.5–14.5)
WBC: 13.8 10*3/uL — AB (ref 3.6–11.0)

## 2016-03-28 MED ORDER — PANTOPRAZOLE SODIUM 40 MG PO TBEC
40.0000 mg | DELAYED_RELEASE_TABLET | Freq: Every day | ORAL | Status: DC
  Administered 2016-03-29 – 2016-03-31 (×3): 40 mg via ORAL
  Filled 2016-03-28 (×3): qty 1

## 2016-03-28 MED ORDER — HEPARIN SODIUM (PORCINE) 5000 UNIT/ML IJ SOLN
5000.0000 [IU] | Freq: Three times a day (TID) | INTRAMUSCULAR | Status: DC
  Administered 2016-03-29 – 2016-03-31 (×8): 5000 [IU] via SUBCUTANEOUS
  Filled 2016-03-28 (×8): qty 1

## 2016-03-28 MED ORDER — LINAGLIPTIN 5 MG PO TABS
5.0000 mg | ORAL_TABLET | Freq: Every day | ORAL | Status: DC
  Administered 2016-03-29 – 2016-03-31 (×3): 5 mg via ORAL
  Filled 2016-03-28 (×3): qty 1

## 2016-03-28 MED ORDER — SIMVASTATIN 40 MG PO TABS
40.0000 mg | ORAL_TABLET | Freq: Every day | ORAL | Status: DC
  Administered 2016-03-29 – 2016-03-30 (×3): 40 mg via ORAL
  Filled 2016-03-28 (×3): qty 1

## 2016-03-28 MED ORDER — INSULIN ASPART 100 UNIT/ML ~~LOC~~ SOLN
0.0000 [IU] | Freq: Every day | SUBCUTANEOUS | Status: DC
  Administered 2016-03-29: 4 [IU] via SUBCUTANEOUS
  Administered 2016-03-30: 2 [IU] via SUBCUTANEOUS
  Filled 2016-03-28: qty 2
  Filled 2016-03-28: qty 4

## 2016-03-28 MED ORDER — LEVOTHYROXINE SODIUM 75 MCG PO TABS
175.0000 ug | ORAL_TABLET | Freq: Every day | ORAL | Status: DC
  Administered 2016-03-29 – 2016-03-31 (×3): 175 ug via ORAL
  Filled 2016-03-28 (×3): qty 1

## 2016-03-28 MED ORDER — OXYCODONE HCL 5 MG PO TABS
5.0000 mg | ORAL_TABLET | ORAL | Status: DC | PRN
  Administered 2016-03-29 – 2016-03-31 (×2): 5 mg via ORAL
  Filled 2016-03-28 (×2): qty 1

## 2016-03-28 MED ORDER — AMITRIPTYLINE HCL 75 MG PO TABS
150.0000 mg | ORAL_TABLET | Freq: Every day | ORAL | Status: DC
  Administered 2016-03-28 – 2016-03-30 (×3): 150 mg via ORAL
  Filled 2016-03-28 (×4): qty 2

## 2016-03-28 MED ORDER — MAGNESIUM CITRATE PO SOLN
1.0000 | Freq: Once | ORAL | Status: DC | PRN
  Filled 2016-03-28: qty 296

## 2016-03-28 MED ORDER — ACETAMINOPHEN 650 MG RE SUPP: 650.0000 mg | Freq: Four times a day (QID) | RECTAL | Status: DC | PRN

## 2016-03-28 MED ORDER — SODIUM CHLORIDE 0.9 % IV SOLN
INTRAVENOUS | Status: DC
  Administered 2016-03-29: 01:00:00 via INTRAVENOUS

## 2016-03-28 MED ORDER — BISACODYL 5 MG PO TBEC: 5.0000 mg | DELAYED_RELEASE_TABLET | Freq: Every day | ORAL | Status: DC | PRN

## 2016-03-28 MED ORDER — IPRATROPIUM-ALBUTEROL 0.5-2.5 (3) MG/3ML IN SOLN: 3.0000 mL | Freq: Four times a day (QID) | RESPIRATORY_TRACT | Status: DC | PRN

## 2016-03-28 MED ORDER — DEXTROSE 5 % IV SOLN: 1.0000 g | Freq: Once | INTRAVENOUS | Status: DC

## 2016-03-28 MED ORDER — ONDANSETRON HCL 4 MG PO TABS: 4.0000 mg | ORAL_TABLET | Freq: Four times a day (QID) | ORAL | Status: DC | PRN

## 2016-03-28 MED ORDER — ACETAMINOPHEN 325 MG PO TABS: 650.0000 mg | ORAL_TABLET | Freq: Four times a day (QID) | ORAL | Status: DC | PRN

## 2016-03-28 MED ORDER — BACID PO TABS
2.0000 | ORAL_TABLET | Freq: Three times a day (TID) | ORAL | Status: DC
  Filled 2016-03-28: qty 2

## 2016-03-28 MED ORDER — LISINOPRIL 10 MG PO TABS
10.0000 mg | ORAL_TABLET | Freq: Every day | ORAL | Status: DC
  Administered 2016-03-29 – 2016-03-31 (×3): 10 mg via ORAL
  Filled 2016-03-28 (×3): qty 1

## 2016-03-28 MED ORDER — VITAMIN B-6 50 MG PO TABS
100.0000 mg | ORAL_TABLET | Freq: Every day | ORAL | Status: DC
  Administered 2016-03-29 – 2016-03-31 (×3): 100 mg via ORAL
  Filled 2016-03-28: qty 1
  Filled 2016-03-28: qty 2
  Filled 2016-03-28: qty 1

## 2016-03-28 MED ORDER — ALBUTEROL SULFATE (2.5 MG/3ML) 0.083% IN NEBU: 2.5000 mg | INHALATION_SOLUTION | Freq: Four times a day (QID) | RESPIRATORY_TRACT | Status: DC | PRN

## 2016-03-28 MED ORDER — INSULIN ASPART 100 UNIT/ML ~~LOC~~ SOLN: 0.0000 [IU] | Freq: Three times a day (TID) | SUBCUTANEOUS | Status: DC

## 2016-03-28 MED ORDER — METHYLPREDNISOLONE SODIUM SUCC 125 MG IJ SOLR
60.0000 mg | Freq: Once | INTRAMUSCULAR | Status: AC
  Administered 2016-03-28: 125 mg via INTRAVENOUS
  Filled 2016-03-28: qty 2

## 2016-03-28 MED ORDER — GABAPENTIN 100 MG PO CAPS
200.0000 mg | ORAL_CAPSULE | Freq: Three times a day (TID) | ORAL | Status: DC
  Administered 2016-03-29 – 2016-03-31 (×8): 200 mg via ORAL
  Filled 2016-03-28 (×8): qty 2

## 2016-03-28 MED ORDER — CEFTRIAXONE SODIUM-DEXTROSE 1-3.74 GM-% IV SOLR
1.0000 g | Freq: Once | INTRAVENOUS | Status: AC
  Administered 2016-03-28: 1 g via INTRAVENOUS
  Filled 2016-03-28: qty 50

## 2016-03-28 MED ORDER — ONDANSETRON HCL 4 MG/2ML IJ SOLN: 4.0000 mg | Freq: Four times a day (QID) | INTRAMUSCULAR | Status: DC | PRN

## 2016-03-28 MED ORDER — VITAMIN B-2 100 MG PO TABS
100.0000 mg | ORAL_TABLET | Freq: Every day | ORAL | Status: DC
  Filled 2016-03-28: qty 1

## 2016-03-28 MED ORDER — SODIUM CHLORIDE 0.9 % IV BOLUS (SEPSIS)
1000.0000 mL | Freq: Once | INTRAVENOUS | Status: AC
  Administered 2016-03-28: 1000 mL via INTRAVENOUS

## 2016-03-28 MED ORDER — IPRATROPIUM-ALBUTEROL 0.5-2.5 (3) MG/3ML IN SOLN
3.0000 mL | Freq: Once | RESPIRATORY_TRACT | Status: AC
  Administered 2016-03-28: 3 mL via RESPIRATORY_TRACT
  Filled 2016-03-28: qty 3

## 2016-03-28 MED ORDER — VITAMIN D3 25 MCG (1000 UNIT) PO TABS
2000.0000 [IU] | ORAL_TABLET | Freq: Every day | ORAL | Status: DC
  Administered 2016-03-29 – 2016-03-31 (×3): 2000 [IU] via ORAL
  Filled 2016-03-28 (×6): qty 2

## 2016-03-28 MED ORDER — IPRATROPIUM BROMIDE 0.02 % IN SOLN: 0.5000 mg | Freq: Four times a day (QID) | RESPIRATORY_TRACT | Status: DC | PRN

## 2016-03-28 MED ORDER — SENNOSIDES-DOCUSATE SODIUM 8.6-50 MG PO TABS: 1.0000 | ORAL_TABLET | Freq: Every evening | ORAL | Status: DC | PRN

## 2016-03-28 MED ORDER — NITROGLYCERIN 0.4 MG SL SUBL: 0.4000 mg | SUBLINGUAL_TABLET | SUBLINGUAL | Status: DC | PRN

## 2016-03-28 MED ORDER — INSULIN NPH (HUMAN) (ISOPHANE) 100 UNIT/ML ~~LOC~~ SUSP: 10.0000 [IU] | Freq: Two times a day (BID) | SUBCUTANEOUS | Status: DC

## 2016-03-28 NOTE — ED Notes (Signed)
Report given to Alene Mireskiera rn floor nurse

## 2016-03-28 NOTE — ED Notes (Signed)
Eating dinner.  Pt more alert.

## 2016-03-28 NOTE — ED Notes (Signed)
Pt reports feeling achy, sob, feeling weak, fever for 1 day.   Pt has urinary pressure.  Unable to void now.   Pt alert.  Family with pt.

## 2016-03-28 NOTE — ED Provider Notes (Signed)
[facility] Emergency Department Provider Note    First MD Initiated Contact with Patient 03/28/16 1931     (approximate)  I have reviewed the triage vital signs and the nursing notes.   HISTORY  Chief Complaint Dysuria; Fever; and Shortness of Breath    HPI Amy Calhoun is a 83 y.o. female presents with chief complaint of one day of suprapubic pain and pressure when urinating with radiation to her right flank. Patient states that she's also been feeling achy and has been having worsening shortness of breath.[place] that she was feeling weak today. Temperature at home was 100.7. Denies any nausea or vomiting. No productive cough. Was having tremors during the fever. In triage patient was noted to be pale appearing and appeared that she was significant only short of breath. Upon arrival to the ER bed the patient denies any shortness of breath but does have some audible wheezing on exam. Her primary complaint is flank pain.   Past Medical History:  Diagnosis Date  . Depression   . Esophageal dysphagia   . Gastric ulcer   . Hyperlipidemia   . Hypertension   . Hypothyroidism   . IDDM (insulin dependent diabetes mellitus) (HCC)   . Ischemic colitis (HCC)   . Osteoarthritis   . Ulcer of esophagus    Family History  Problem Relation Age of Onset  . Diabetes Mother   . Arthritis Mother   . Hyperlipidemia Mother   . Mental illness Mother   . Heart disease Father   . Mental illness Sister   . Arthritis Maternal Grandmother   . Arthritis Maternal Grandfather    Past Surgical History:  Procedure Laterality Date  . ABDOMINAL HYSTERECTOMY    . GALLBLADDER SURGERY    . THYROIDECTOMY     Patient Active Problem List   Diagnosis Date Noted  . Left hip pain 03/16/2016  . Spondylosis of lumbar joint 03/16/2016  . History of colitis 03/02/2016  . UTI (urinary tract infection) 03/02/2016  . Primary osteoarthritis of left hip 03/02/2016  . CKD (chronic  kidney disease) stage 3, GFR 30-59 ml/min 02/21/2016  . DM type 2 with diabetic peripheral neuropathy (HCC) 01/19/2016  . Hyperlipidemia 01/19/2016  . Hypothyroidism 01/19/2016  . Essential hypertension 01/19/2016  . Chronic pain 01/19/2016  . Other dysphagia 12/21/2015  . Esophageal dysmotility 12/21/2015      Prior to Admission medications   Medication Sig Start Date End Date Taking? Authorizing Provider  acetaminophen-codeine (TYLENOL #3) 300-30 MG tablet Take 1 tablet by mouth every 6 (six) hours as needed. 01/19/16   Historical Provider, MD  amitriptyline (ELAVIL) 75 MG tablet Take 2 tablets (150 mg total) by mouth at bedtime. 02/13/16   Tommie Sams, DO  aspirin 81 MG chewable tablet Chew 81 mg by mouth daily.    Historical Provider, MD  BD INSULIN SYRINGE ULTRAFINE 31G X 15/64" 0.5 ML MISC  02/22/16   Historical Provider, MD  Cholecalciferol (VITAMIN D) 2000 units CAPS Take by mouth daily.    Historical Provider, MD  gabapentin (NEURONTIN) 100 MG capsule Take 2 capsules (200 mg total) by mouth 3 (three) times daily. 03/27/16   Andrena Mews, DO  glucagon (GLUCAGON EMERGENCY) 1 MG injection Inject 1 mg into the vein once as needed. For hypoglycemic episodes. E11.42 03/13/16   Tommie Sams, DO  glucose blood (ONETOUCH VERIO) test strip USE AS INSTRUCTED to check sugars up to four times daily. E11.9 02/21/16  Tommie Sams, DO  insulin NPH Human (HUMULIN N,NOVOLIN N) 100 UNIT/ML injection 30 units every morning and 10 units at night 02/13/16   Tommie Sams, DO  insulin regular (NOVOLIN R,HUMULIN R) 250 units/2.48mL (100 units/mL) injection Inject 0.06 mLs (6 Units total) into the skin 3 (three) times daily before meals. Patient taking differently: Inject 6 Units into the skin 3 (three) times daily before meals. Hold if bs is less than 100 02/13/16   Tommie Sams, DO  INSULIN SYRINGE 1CC/29G 29G X 1/2" 1 ML MISC As directed to administer insulin 02/21/16   Tommie Sams, DO  levothyroxine (SYNTHROID,  LEVOTHROID) 175 MCG tablet Take 1 tablet (175 mcg total) by mouth daily before breakfast. 01/22/16   Tommie Sams, DO  lidocaine (LIDODERM) 5 % Place 1 patch onto the skin daily. 03/02/16   Tommie Sams, DO  linagliptin (TRADJENTA) 5 MG TABS tablet Take 1 tablet (5 mg total) by mouth daily. 02/21/16   Tommie Sams, DO  lisinopril (PRINIVIL,ZESTRIL) 10 MG tablet Take 1 tablet (10 mg total) by mouth daily. 01/19/16   Tommie Sams, DO  omeprazole (PRILOSEC) 40 MG capsule Take 1 capsule (40 mg total) by mouth daily. 01/19/16   Tommie Sams, DO  predniSONE (DELTASONE) 20 MG tablet Take 1 tablet (20 mg total) by mouth daily with breakfast. 03/21/16   Andrena Mews, DO  pyridOXINE (VITAMIN B-6) 100 MG tablet Take 100 mg by mouth daily.    Historical Provider, MD  riboflavin (VITAMIN B-2) 100 MG TABS tablet Take 100 mg by mouth daily.    Historical Provider, MD  simvastatin (ZOCOR) 40 MG tablet Take 40 mg by mouth daily.    Historical Provider, MD    Allergies Pravastatin    Social History Social History  Substance Use Topics  . Smoking status: Never Smoker  . Smokeless tobacco: Never Used  . Alcohol use No    Review of Systems Patient denies headaches, rhinorrhea, blurry vision, numbness, shortness of breath, chest pain, edema, cough, abdominal pain, nausea, vomiting, diarrhea, dysuria, fevers, rashes or hallucinations unless otherwise stated above in HPI. ____________________________________________   PHYSICAL EXAM:  VITAL SIGNS: Vitals:   03/28/16 1913  BP: 139/73  Pulse: (!) 106  Resp: (!) 26  Temp: 98.1 F (36.7 C)    Constitutional: Alert and oriented.  in no acute distress. Eyes: Conjunctivae are normal. PERRL. EOMI. Head: Atraumatic. Nose: No congestion/rhinnorhea. Mouth/Throat: Mucous membranes are moist.  Oropharynx non-erythematous. Neck: No stridor. Painless ROM. No cervical spine tenderness to palpation Hematological/Lymphatic/Immunilogical: No cervical  lymphadenopathy. Cardiovascular: Normal rate, regular rhythm. Grossly normal heart sounds.  Good peripheral circulation. Respiratory: Normal respiratory effort.  No retractions. Lungs CTAB. Gastrointestinal: Soft and nontender. No distention. No abdominal bruits. No CVA tenderness. Genitourinary:  Musculoskeletal: No lower extremity tenderness nor edema.  No joint effusions. Neurologic:  Normal speech and language. No gross focal neurologic deficits are appreciated. No gait instability. Skin:  Skin is warm, dry and intact. No rash noted. Psychiatric: Mood and affect are normal. Speech and behavior are normal.  ____________________________________________   LABS (all labs ordered are listed, but only abnormal results are displayed)  Results for orders placed or performed during the hospital encounter of 03/28/16 (from the past 24 hour(s))  CBC     Status: Abnormal   Collection Time: 03/28/16  7:34 PM  Result Value Ref Range   WBC 13.8 (H) 3.6 - 11.0 K/uL   RBC 4.17 3.80 -  5.20 MIL/uL   Hemoglobin 12.3 12.0 - 16.0 g/dL   HCT 16.137.2 09.635.0 - 04.547.0 %   MCV 89.2 80.0 - 100.0 fL   MCH 29.5 26.0 - 34.0 pg   MCHC 33.1 32.0 - 36.0 g/dL   RDW 40.914.4 81.111.5 - 91.414.5 %   Platelets 196 150 - 440 K/uL   ____________________________________________  EKG My review and personal interpretation at Time: 19:25   Indication: flank pain  Rate: 10  Rhythm: 105 Axis: normal Other: no st elevations or depressions, normal intervals ____________________________________________  RADIOLOGY  I personally reviewed all radiographic images ordered to evaluate for the above acute complaints and reviewed radiology reports and findings.  These findings were personally discussed with the patient.  Please see medical record for radiology report.  ____________________________________________   PROCEDURES  Procedure(s) performed:  Procedures    Critical Care performed:  no ____________________________________________   INITIAL IMPRESSION / ASSESSMENT AND PLAN / ED COURSE  Pertinent labs & imaging results that were available during my care of the patient were reviewed by me and considered in my medical decision making (see chart for details).  DDX: uti, copd, acs, colitis, stone  Amy Calhoun is a 83 y.o. who presents to the ED with Suprapubic discomfort as well as fever at home and weakness.  Patient afebrile and hemodynamically stable upon arrival to the ER part she does appear frail. Exam with shortness of breath is concerning for some component of COPD or bronchitis that she does have wheezing and was recently on steroids recently stopped therefore may have some component of reactive bronchospasm. Patient with some improvement after nebulizer treatments. CT imaging ordered to evaluate for any evidence of kidney stone shows none. Currently awaiting urinalysis. Does have a mild leukocytosis but no significant acidosis. No evidence of heart failure or ACS.  Clinical Course as of Mar 29 2218  Wed Mar 28, 2016  2212 Patient with evidence of UTI.  In the setting of her flank pain I am concern for pyelonephritis that she had fever at home to 100.7 with weakness and with mild leukocytosis here. I will order IV abx, continue ivf and discussed case with Dr. Emmit PomfretHugelmeyer who agrees to admit patient for further evaluation and management.  [PR]    Clinical Course User Index [PR] Willy EddyPatrick Cambria Osten, MD     ____________________________________________   FINAL CLINICAL IMPRESSION(S) / ED DIAGNOSES  Final diagnoses:  Pyelonephritis  Shortness of breath      NEW MEDICATIONS STARTED DURING THIS VISIT:  New Prescriptions   No medications on file     Note:  This document was prepared using Dragon voice recognition software and may include unintentional dictation errors.     Willy EddyPatrick Marisue Canion, MD 03/28/16 2223

## 2016-03-28 NOTE — ED Triage Notes (Addendum)
Pt presents to ED with lower abd pressure since yesterday with painful urination and right sided flank pain. Pt daughter reports today pt was running a low grade fever and seemed to be short of breath with tremors noted. Pt pale with increased work of breathing noted at this time. Audible wheezing present. Pt eyes closed and not answering questions during triage.

## 2016-03-28 NOTE — H&P (Signed)
History and Physical   [facility] - [place] @ [facility] Admission History and Physical [employer], D.O.    Patient Name: Amy Calhoun MR#: [id number] Date of Birth: 1928-03-02 Date of Admission: 03/28/2016  Referring MD/NP/PA: Dr. Roxan Hockey Primary Care Physician: Tommie Sams, DO Patient coming from: Home Outpatient Specialists: None   Chief Complaint:  Chief Complaint  Patient presents with  . Dysuria  . Fever  . Shortness of Breath  . Weakness    HPI: Amy Calhoun is a 83 y.o. female with a known history of depression, dysphagia, gastric ulcer, hypertension, hyperlipidemia, hypothyroidism, type 2 diabetes, ischemic colitis, osteoarthritis presents to the emergency department for evaluation of.  Patient was in a usual state of health until One day ago when she describes sudden onset of dysuria described as pressure and burning as well as lower abdominal pain radiating to the right flank. She has had fevers, achiness, weakness.. She also complained of some mild shortness of breath which improved with oxygen. She does not have any history of COPD or asthma.   Otherwise there has been no change in status. Patient has been taking medication as prescribed and there has been no recent change in medication or diet.  No recent antibiotics.  There has been no recent illness, hospitalizations, travel or sick contacts.    ED Course: Patient received Elavil, Rocephin, DuoNeb, Solu-Medrol and normal saline.  Review of Systems:  CONSTITUTIONAL: No fever/chills, fatigue,weight gain/loss, headache. Positive generalized weakness EYES: No blurry or double vision. ENT: No tinnitus, postnasal drip, redness or soreness of the oropharynx. RESPIRATORY: No cough, wheeze.  No hemoptysis. Positive shortness of breath CARDIOVASCULAR: No chest pain, palpitations, syncope, orthopnea. No lower extremity edema.  GASTROINTESTINAL: No nausea, vomiting, abdominal pain, diarrhea, constipation.  No  hematemesis, melena or hematochezia. GENITOURINARY: Positive dysuria, frequency, suprapubic and flank pain  ENDOCRINE: No polyuria or nocturia. No heat or cold intolerance. HEMATOLOGY: No anemia, bruising, bleeding. INTEGUMENTARY: No rashes, ulcers, lesions. MUSCULOSKELETAL: No arthritis, gout, dyspnea. NEUROLOGIC: No numbness, tingling, ataxia, seizure-type activity, weakness. PSYCHIATRIC: No anxiety, depression, insomnia.   Past Medical History:  Diagnosis Date  . Depression   . Esophageal dysphagia   . Gastric ulcer   . Hyperlipidemia   . Hypertension   . Hypothyroidism   . IDDM (insulin dependent diabetes mellitus) (HCC)   . Ischemic colitis (HCC)   . Osteoarthritis   . Ulcer of esophagus     Past Surgical History:  Procedure Laterality Date  . ABDOMINAL HYSTERECTOMY    . GALLBLADDER SURGERY    . THYROIDECTOMY       reports that she has never smoked. She has never used smokeless tobacco. She reports that she does not drink alcohol or use drugs.  Allergies  Allergen Reactions  . Pravastatin Other (See Comments)    States she refused due to side effects    Family History  Problem Relation Age of Onset  . Diabetes Mother   . Arthritis Mother   . Hyperlipidemia Mother   . Mental illness Mother   . Heart disease Father   . Mental illness Sister   . Arthritis Maternal Grandmother   . Arthritis Maternal Grandfather     Prior to Admission medications   Medication Sig Start Date End Date Taking? Authorizing Provider  acetaminophen-codeine (TYLENOL #3) 300-30 MG tablet Take 1 tablet by mouth every 6 (six) hours as needed for moderate pain.  01/19/16  Yes Historical Provider, MD  amitriptyline (ELAVIL) 75 MG tablet Take 2  tablets (150 mg total) by mouth at bedtime. 02/13/16  Yes Tommie Sams, DO  BD INSULIN SYRINGE ULTRAFINE 31G X 15/64" 0.5 ML MISC  02/22/16  Yes Historical Provider, MD  Cholecalciferol (VITAMIN D) 2000 units CAPS Take 2,000 Units by mouth daily.    Yes  Historical Provider, MD  gabapentin (NEURONTIN) 100 MG capsule Take 2 capsules (200 mg total) by mouth 3 (three) times daily. 03/27/16  Yes Andrena Mews, DO  glucagon (GLUCAGON EMERGENCY) 1 MG injection Inject 1 mg into the vein once as needed. For hypoglycemic episodes. E11.42 03/13/16  Yes Jayce G Cook, DO  glucose blood (ONETOUCH VERIO) test strip USE AS INSTRUCTED to check sugars up to four times daily. E11.9 02/21/16  Yes Jayce G Cook, DO  insulin NPH Human (HUMULIN N,NOVOLIN N) 100 UNIT/ML injection 30 units every morning and 10 units at night Patient taking differently: Inject 10-25 Units into the skin 2 (two) times daily before a meal. 25 units every morning and 10 units at night 02/13/16  Yes Jayce G Cook, DO  insulin regular (NOVOLIN R,HUMULIN R) 250 units/2.17mL (100 units/mL) injection Inject 0.06 mLs (6 Units total) into the skin 3 (three) times daily before meals. Patient taking differently: Inject 6 Units into the skin 3 (three) times daily before meals. Hold if bs is less than 100 02/13/16  Yes Jayce Berton Lan, DO  INSULIN SYRINGE 1CC/29G 29G X 1/2" 1 ML MISC As directed to administer insulin 02/21/16  Yes Tommie Sams, DO  levothyroxine (SYNTHROID, LEVOTHROID) 175 MCG tablet Take 1 tablet (175 mcg total) by mouth daily before breakfast. 01/22/16  Yes Jayce G Cook, DO  lidocaine (LIDODERM) 5 % Place 1 patch onto the skin daily. 03/02/16  Yes Tommie Sams, DO  linagliptin (TRADJENTA) 5 MG TABS tablet Take 1 tablet (5 mg total) by mouth daily. 02/21/16  Yes Tommie Sams, DO  lisinopril (PRINIVIL,ZESTRIL) 10 MG tablet Take 1 tablet (10 mg total) by mouth daily. 01/19/16  Yes Tommie Sams, DO  omeprazole (PRILOSEC) 40 MG capsule Take 1 capsule (40 mg total) by mouth daily. 01/19/16  Yes Tommie Sams, DO  pyridOXINE (VITAMIN B-6) 100 MG tablet Take 100 mg by mouth daily.   Yes Historical Provider, MD  riboflavin (VITAMIN B-2) 100 MG TABS tablet Take 100 mg by mouth daily.   Yes Historical Provider, MD   simvastatin (ZOCOR) 40 MG tablet Take 40 mg by mouth at bedtime.    Yes Historical Provider, MD    Physical Exam: Vitals:   03/28/16 2030 03/28/16 2100 03/28/16 2130 03/28/16 2200  BP:  (!) 146/54 (!) 113/53 (!) 135/57  Pulse: 95 (!) 101 (!) 102 97  Resp: 16 20 17 17   Temp:      TempSrc:      SpO2: 93% 90% 97% 98%  Weight:      Height:        GENERAL: 83 y.o.-year-old Female patient, well-developed, well-nourished lying in the bed in no acute distress.  Pleasant and cooperative.   HEENT: Head atraumatic, normocephalic. Pupils equal, round, reactive to light and accommodation. No scleral icterus. Extraocular muscles intact. Nares are patent. Oropharynx is clear. Mucus membranes moist. Hard of hearing NECK: Supple, full range of motion. No JVD, no bruit heard. No thyroid enlargement, no tenderness, no cervical lymphadenopathy. CHEST: Normal breath sounds bilaterally. No wheezing, rales, rhonchi or crackles. No use of accessory muscles of respiration.  No reproducible chest wall tenderness.  CARDIOVASCULAR: S1,  S2 normal. No murmurs, rubs, or gallops. Cap refill <2 seconds. Pulses intact distally.  ABDOMEN: Soft, nondistended, nontender. No rebound, guarding, rigidity. Normoactive bowel sounds present in all four quadrants. No organomegaly or mass. positive CVA tenderness EXTREMITIES: No pedal edema, cyanosis, or clubbing. No calf tenderness or Homan's sign.  NEUROLOGIC: The patient is alert and oriented x 3. Cranial nerves II through XII are grossly intact with no focal sensorimotor deficit. Muscle strength 5/5 in all extremities. Sensation intact. Gait not checked. PSYCHIATRIC:  Normal affect, mood, thought content. SKIN: Warm, dry, and intact without obvious rash, lesion, or ulcer.    Labs on Admission:  CBC:  Recent Labs Lab 03/28/16 1934  WBC 13.8*  HGB 12.3  HCT 37.2  MCV 89.2  PLT 196   Basic Metabolic Panel:  Recent Labs Lab 03/28/16 1934  NA 134*  K 4.2  CL  100*  CO2 24  GLUCOSE 188*  BUN 37*  CREATININE 1.63*  CALCIUM 8.2*   GFR: Estimated Creatinine Clearance: 25.6 mL/min (by C-G formula based on SCr of 1.63 mg/dL (H)). Liver Function Tests: No results for input(s): AST, ALT, ALKPHOS, BILITOT, PROT, ALBUMIN in the last 168 hours. No results for input(s): LIPASE, AMYLASE in the last 168 hours. No results for input(s): AMMONIA in the last 168 hours. Coagulation Profile: No results for input(s): INR, PROTIME in the last 168 hours. Cardiac Enzymes:  Recent Labs Lab 03/28/16 1934  TROPONINI <0.03   BNP (last 3 results) No results for input(s): PROBNP in the last 8760 hours. HbA1C: No results for input(s): HGBA1C in the last 72 hours. CBG: No results for input(s): GLUCAP in the last 168 hours. Lipid Profile: No results for input(s): CHOL, HDL, LDLCALC, TRIG, CHOLHDL, LDLDIRECT in the last 72 hours. Thyroid Function Tests: No results for input(s): TSH, T4TOTAL, FREET4, T3FREE, THYROIDAB in the last 72 hours. Anemia Panel: No results for input(s): VITAMINB12, FOLATE, FERRITIN, TIBC, IRON, RETICCTPCT in the last 72 hours. Urine analysis:    Component Value Date/Time   COLORURINE AMBER (A) 03/28/2016 2008   APPEARANCEUR CLOUDY (A) 03/28/2016 2008   LABSPEC 1.011 03/28/2016 2008   PHURINE 5.0 03/28/2016 2008   GLUCOSEU NEGATIVE 03/28/2016 2008   HGBUR SMALL (A) 03/28/2016 2008   BILIRUBINUR NEGATIVE 03/28/2016 2008   KETONESUR NEGATIVE 03/28/2016 2008   PROTEINUR 100 (A) 03/28/2016 2008   NITRITE POSITIVE (A) 03/28/2016 2008   LEUKOCYTESUR MODERATE (A) 03/28/2016 2008   Sepsis Labs: @LABRCNTIP (procalcitonin:4,lacticidven:4) )No results found for this or any previous visit (from the past 240 hour(s)).   Radiological Exams on Admission: Dg Chest 2 View  Result Date: 03/28/2016 CLINICAL DATA:  83 year old female with shortness of breath. EXAM: CHEST  2 VIEW COMPARISON:  Chest radiograph dated 03/01/2016 FINDINGS: Stable mild  eventration of the right hemidiaphragm. The lungs are clear. There is no pleural effusion or pneumothorax. The cardiac silhouette is within normal limits. The aorta is mildly tortuous. There is atherosclerotic calcification of the aortic arch. Old healed left posterior rib fractures. No acute fracture identified. IMPRESSION: No active cardiopulmonary disease. Electronically Signed   By: Elgie Collard M.D.   On: 03/28/2016 20:03   Ct Renal Stone Study  Result Date: 03/28/2016 CLINICAL DATA:  Acute right flank pain. EXAM: CT ABDOMEN AND PELVIS WITHOUT CONTRAST TECHNIQUE: Multidetector CT imaging of the abdomen and pelvis was performed following the standard protocol without IV contrast. COMPARISON:  None. FINDINGS: Lower chest: Mitral valve and coronary artery calcifications. Heart is normal size. No confluent  opacity or effusion. Hepatobiliary: Prior cholecystectomy. Mild intrahepatic and extrahepatic biliary ductal dilatation, likely related to post cholecystectomy state. Pancreas: Fatty replacement. No focal abnormality or ductal dilatation. Spleen: No focal abnormality.  Normal size. Adrenals/Urinary Tract: Adrenal glands are unremarkable. No renal or ureteral stones. No hydronephrosis. Small amount of gas anteriorly within the urinary bladder, presumably from catheterization. Bladder is unremarkable. Stomach/Bowel: Large stool burden in the colon. Stomach, large and small bowel grossly unremarkable. Vascular/Lymphatic: Aortic and iliac calcifications. No aneurysm. No adenopathy. Reproductive: Prior hysterectomy.  No adnexal masses. Other: No free fluid or free air. Stranding in the subcutaneous soft tissues in the right anterior abdominal wall, possibly related to subcutaneous injections. Recommend clinical correlation. Musculoskeletal: Degenerative changes in the lumbar spine. IMPRESSION: Moderate large stool burden in the colon. Prior cholecystectomy and hysterectomy. No renal or ureteral stones.  No  hydronephrosis. Aorta iliac atherosclerosis. Electronically Signed   By: Charlett NoseKevin  Dover M.D.   On: 03/28/2016 20:54    EKG: Sinus tachycardia at 105 with normal axis and nonspecific ST-T wave changes.   Assessment/Plan  This is a 83 y.o. female with a history of depression, dysphagia, gastric ulcer, hypertension, hyperlipidemia, hypothyroidism, type 2 diabetes, ischemic colitis, osteoarthritis now being admitted with:  #. Pyelonephritis - Admit inpatient - IV Rocephin - Follow up urine cultures  #. Dehydration with acute kidney injury  - IV fluids and repeat BMP in AM.  - Avoid nephrotoxic medications - Bladder scan and place foley catheter if evidence of urinary retention  #. Shortness of breath without hypoxia. May be bronchospasm or underlying COPD. Patient's symptoms are relieved with O2. -Continue O2 and nebulizer therapy as needed -Continue steroid taper.  #. Chest pain, rule out ACS - Telemetry monitoring. - Trend troponins, check lipids and TSH.  #. H/o Diabetes - Accuchecks achs with RISS coverage - Heart healthy, carb controlled diet -Continue Tradjenta, NPH  #. History of hypothyroidism - Continue Synthroid  #. History of hypertension - Continue lisinopril  #. History of GERD - Continue Protonix  #. History of hyperlipidemia - Continue Zocor  Admission status: Inpatient IV Fluids: Normal saline Diet/Nutrition: Heart healthy, carb controlled Consults called: None  DVT Px: Lovenox, SCDs and early ambulation. Code Status: Full Code  Disposition Plan: To home in 1-2 days  All the records are reviewed and case discussed with ED provider. Management plans discussed with the patient and/or family who express understanding and agree with plan of care.  Renette Hsu D.O. on 03/28/2016 at 11:26 PM Between 7am to 6pm - Pager - [phone] After 6pm go to [email or web address] - Administrator, Civil Servicepassword EPAS [facility] [place] [facility] Office [phone] CC:  Primary care physician; Tommie SamsJayce G Cook, DO   03/28/2016, 11:26 PM

## 2016-03-28 NOTE — ED Notes (Signed)
Pt states sob.  Placed on 1 liter oxygen   Dr Roxan Hockeyrobinson aware.  Iv fluids infusing.

## 2016-03-29 LAB — TROPONIN I: Troponin I: <0.03 ng/mL (ref <0.03)

## 2016-03-29 LAB — GLUCOSE, CAPILLARY
GLUCOSE-CAPILLARY: 142 mg/dL — AB (ref 65–99)
GLUCOSE-CAPILLARY: 310 mg/dL — AB (ref 65–99)
GLUCOSE-CAPILLARY: 417 mg/dL — AB (ref 65–99)
GLUCOSE-CAPILLARY: 469 mg/dL — AB (ref 65–99)
Glucose-Capillary: 417 mg/dL — ABNORMAL HIGH (ref 65–99)
Glucose-Capillary: 352 mg/dL — ABNORMAL HIGH (ref 65–99)
Glucose-Capillary: 272 mg/dL — ABNORMAL HIGH (ref 65–99)
Glucose-Capillary: 160 mg/dL — ABNORMAL HIGH (ref 65–99)

## 2016-03-29 LAB — BASIC METABOLIC PANEL
Anion gap: 8 (ref 5–15)
BUN: 42 mg/dL — ABNORMAL HIGH (ref 6–20)
CHLORIDE: 102 mmol/L (ref 101–111)
CO2: 22 mmol/L (ref 22–32)
Calcium: 7.4 mg/dL — ABNORMAL LOW (ref 8.9–10.3)
Creatinine, Ser: 1.63 mg/dL — ABNORMAL HIGH (ref 0.44–1.00)
GFR calc Af Amer: 32 mL/min — ABNORMAL LOW (ref >60)
GFR calc non Af Amer: 28 mL/min — ABNORMAL LOW (ref >60)
GLUCOSE: 342 mg/dL — AB (ref 65–99)
POTASSIUM: 4.8 mmol/L (ref 3.5–5.1)
Sodium: 132 mmol/L — ABNORMAL LOW (ref 135–145)

## 2016-03-29 LAB — CBC
HEMATOCRIT: 32.5 % — AB (ref 35.0–47.0)
HEMOGLOBIN: 10.6 g/dL — AB (ref 12.0–16.0)
MCH: 28.7 pg (ref 26.0–34.0)
MCHC: 32.6 g/dL (ref 32.0–36.0)
MCV: 88.2 fL (ref 80.0–100.0)
Platelets: 161 10*3/uL (ref 150–440)
RBC: 3.69 MIL/uL — ABNORMAL LOW (ref 3.80–5.20)
RDW: 14.5 % (ref 11.5–14.5)
WBC: 16.3 10*3/uL — AB (ref 3.6–11.0)

## 2016-03-29 LAB — LIPID PANEL
Cholesterol: 114 mg/dL (ref 0–200)
HDL: 50 mg/dL (ref >40)
LDL CALC: 44 mg/dL (ref 0–99)
TRIGLYCERIDES: 100 mg/dL (ref <150)
Total CHOL/HDL Ratio: 2.3 RATIO
VLDL: 20 mg/dL (ref 0–40)

## 2016-03-29 LAB — TSH: TSH: 0.136 u[IU]/mL — AB (ref 0.350–4.500)

## 2016-03-29 LAB — T4, FREE: FREE T4: 1.42 ng/dL — AB (ref 0.61–1.12)

## 2016-03-29 LAB — GLUCOSE, RANDOM: GLUCOSE: 460 mg/dL — AB (ref 65–99)

## 2016-03-29 MED ORDER — INSULIN DETEMIR 100 UNIT/ML ~~LOC~~ SOLN
10.0000 [IU] | Freq: Every day | SUBCUTANEOUS | Status: DC
  Administered 2016-03-29 – 2016-03-30 (×2): 10 [IU] via SUBCUTANEOUS
  Filled 2016-03-29 (×3): qty 0.1

## 2016-03-29 MED ORDER — INSULIN NPH (HUMAN) (ISOPHANE) 100 UNIT/ML ~~LOC~~ SUSP
25.0000 [IU] | Freq: Every day | SUBCUTANEOUS | Status: DC
  Filled 2016-03-29: qty 10

## 2016-03-29 MED ORDER — SODIUM CHLORIDE 0.9 % IV BOLUS (SEPSIS)
500.0000 mL | Freq: Once | INTRAVENOUS | Status: AC
  Administered 2016-03-29: 500 mL via INTRAVENOUS

## 2016-03-29 MED ORDER — CEFTRIAXONE SODIUM 2 G IJ SOLR
2.0000 g | INTRAMUSCULAR | Status: DC
  Administered 2016-03-29 – 2016-03-30 (×2): 2 g via INTRAVENOUS
  Filled 2016-03-29 (×3): qty 2

## 2016-03-29 MED ORDER — INSULIN DETEMIR 100 UNIT/ML ~~LOC~~ SOLN
25.0000 [IU] | Freq: Every day | SUBCUTANEOUS | Status: DC
  Administered 2016-03-29 – 2016-03-31 (×3): 25 [IU] via SUBCUTANEOUS
  Filled 2016-03-29 (×3): qty 0.25

## 2016-03-29 MED ORDER — RISAQUAD PO CAPS
2.0000 | ORAL_CAPSULE | Freq: Three times a day (TID) | ORAL | Status: DC
  Administered 2016-03-29 – 2016-03-31 (×7): 2 via ORAL
  Filled 2016-03-29 (×7): qty 2

## 2016-03-29 MED ORDER — INSULIN NPH (HUMAN) (ISOPHANE) 100 UNIT/ML ~~LOC~~ SUSP
10.0000 [IU] | Freq: Every day | SUBCUTANEOUS | Status: DC
  Filled 2016-03-29: qty 10

## 2016-03-29 MED ORDER — INSULIN ASPART 100 UNIT/ML ~~LOC~~ SOLN
0.0000 [IU] | Freq: Three times a day (TID) | SUBCUTANEOUS | Status: DC
  Administered 2016-03-29 (×2): 20 [IU] via SUBCUTANEOUS
  Administered 2016-03-29: 4 [IU] via SUBCUTANEOUS
  Administered 2016-03-30: 3 [IU] via SUBCUTANEOUS
  Filled 2016-03-29: qty 4
  Filled 2016-03-29: qty 3
  Filled 2016-03-29 (×2): qty 20

## 2016-03-29 NOTE — Progress Notes (Signed)
[facility]   PATIENT NAME: Amy MuirRosetta Calhoun    MR#:  [id number]  DATE OF BIRTH:  05/24/1928  SUBJECTIVE:  CHIEF COMPLAINT:  Patient's shortness of breath significantly improved number smoked in her life was very anxious yesterday resting comfortably today  REVIEW OF SYSTEMS:  CONSTITUTIONAL: No fever, fatigue or weakness.  EYES: No blurred or double vision.  EARS, NOSE, AND THROAT: No tinnitus or ear pain.  RESPIRATORY: No cough, shortness of breath, wheezing or hemoptysis.  CARDIOVASCULAR: No chest pain, orthopnea, edema.  GASTROINTESTINAL: No nausea, vomiting, diarrhea or abdominal pain.  GENITOURINARY: No dysuria, hematuria.  ENDOCRINE: No polyuria, nocturia,  HEMATOLOGY: No anemia, easy bruising or bleeding SKIN: No rash or lesion. MUSCULOSKELETAL: Reports chronic history of arthritis.   NEUROLOGIC: No tingling, numbness, weakness.  PSYCHIATRY: No anxiety or depression.   DRUG ALLERGIES:   Allergies  Allergen Reactions  . Pravastatin Other (See Comments)    States she refused due to side effects    VITALS:  Blood pressure (!) 117/49, pulse 84, temperature 98 F (36.7 C), temperature source Oral, resp. rate 17, height 5\' 6"  (1.676 m), weight 73.3 kg (161 lb 11.2 oz), SpO2 98 %.  PHYSICAL EXAMINATION:  GENERAL:  83 y.o.-year-old patient lying in the bed with no acute distress.  EYES: Pupils equal, round, reactive to light and accommodation. No scleral icterus. Extraocular muscles intact.  HEENT: Head atraumatic, normocephalic. Oropharynx and nasopharynx clear.  NECK:  Supple, no jugular venous distention. No thyroid enlargement, no tenderness.  LUNGS: Normal breath sounds bilaterally, no wheezing, rales,rhonchi or crepitation. No use of accessory muscles of respiration.  CARDIOVASCULAR: S1, S2 normal. No murmurs, rubs, or gallops.  ABDOMEN: Soft, nontender, nondistended. Bowel sounds present. No organomegaly or mass.   EXTREMITIES: No pedal edema, cyanosis, or clubbing.  NEUROLOGIC: Cranial nerves II through XII are intact. Muscle strength 5/5 in all extremities. Sensation intact. Gait not checked.  PSYCHIATRIC: The patient is alert and oriented x 3.  SKIN: No obvious rash, lesion, or ulcer.    LABORATORY PANEL:   CBC  Recent Labs Lab 03/29/16 0120  WBC 16.3*  HGB 10.6*  HCT 32.5*  PLT 161   ------------------------------------------------------------------------------------------------------------------  Chemistries   Recent Labs Lab 03/29/16 0120 03/29/16 0750  NA 132*  --   K 4.8  --   CL 102  --   CO2 22  --   GLUCOSE 342* 460*  BUN 42*  --   CREATININE 1.63*  --   CALCIUM 7.4*  --    ------------------------------------------------------------------------------------------------------------------  Cardiac Enzymes  Recent Labs Lab 03/29/16 1411  TROPONINI <0.03   ------------------------------------------------------------------------------------------------------------------  RADIOLOGY:  Dg Chest 2 View  Result Date: 03/28/2016 CLINICAL DATA:  83 year old female with shortness of breath. EXAM: CHEST  2 VIEW COMPARISON:  Chest radiograph dated 03/01/2016 FINDINGS: Stable mild eventration of the right hemidiaphragm. The lungs are clear. There is no pleural effusion or pneumothorax. The cardiac silhouette is within normal limits. The aorta is mildly tortuous. There is atherosclerotic calcification of the aortic arch. Old healed left posterior rib fractures. No acute fracture identified. IMPRESSION: No active cardiopulmonary disease. Electronically Signed   By: Elgie CollardArash  Radparvar M.D.   On: 03/28/2016 20:03   Ct Renal Stone Study  Result Date: 03/28/2016 CLINICAL DATA:  Acute right flank pain. EXAM: CT ABDOMEN AND PELVIS WITHOUT CONTRAST TECHNIQUE: Multidetector CT imaging of the abdomen and pelvis was performed following the standard protocol without IV contrast. COMPARISON:  None. FINDINGS: Lower chest: Mitral valve and coronary artery calcifications. Heart is normal size. No confluent opacity or effusion. Hepatobiliary: Prior cholecystectomy. Mild intrahepatic and extrahepatic biliary ductal dilatation, likely related to post cholecystectomy state. Pancreas: Fatty replacement. No focal abnormality or ductal dilatation. Spleen: No focal abnormality.  Normal size. Adrenals/Urinary Tract: Adrenal glands are unremarkable. No renal or ureteral stones. No hydronephrosis. Small amount of gas anteriorly within the urinary bladder, presumably from catheterization. Bladder is unremarkable. Stomach/Bowel: Large stool burden in the colon. Stomach, large and small bowel grossly unremarkable. Vascular/Lymphatic: Aortic and iliac calcifications. No aneurysm. No adenopathy. Reproductive: Prior hysterectomy.  No adnexal masses. Other: No free fluid or free air. Stranding in the subcutaneous soft tissues in the right anterior abdominal wall, possibly related to subcutaneous injections. Recommend clinical correlation. Musculoskeletal: Degenerative changes in the lumbar spine. IMPRESSION: Moderate large stool burden in the colon. Prior cholecystectomy and hysterectomy. No renal or ureteral stones.  No hydronephrosis. Aorta iliac atherosclerosis. Electronically Signed   By: Charlett Nose M.D.   On: 03/28/2016 20:54    EKG:   Orders placed or performed during the hospital encounter of 03/28/16  . ED EKG  . ED EKG  . EKG 12-Lead    ASSESSMENT AND PLAN:   This is a 83 y.o. female with a history of depression, dysphagia, gastric ulcer, hypertension, hyperlipidemia, hypothyroidism, type 2 diabetes, ischemic colitis, osteoarthritis now being admitted with:  #. Acute Pyelonephritis - IV Rocephin, IV fluids - Follow up urine cultures  #. Dehydration with acute kidney injury  - IV fluids and repeat BMP in AM.  - Avoid nephrotoxic medications - Bladder scan and place foley catheter if evidence  of urinary retention  #. Shortness of breath without hypoxia. Could be from anxiety- Shortness of breath significantly improved ontinue O2 and nebulizer therapy as needed   #Steroid-induced hyperglycemia Patient just finished outpatient steroid tapping dose and has received IV Solu-Medrol once last night Given Levemir 25 units once and on high-dose sliding scale Follow-up with diabetic coordinator  #. Chest pain, rule out ACS - Telemetry monitoring. - Trend troponins, check lipids and TSH.  #. H/o Diabetes - Accuchecks achs with RISS coverage - Heart healthy, carb controlled diet -Continue Tradjenta,  Levemir  #. History of hypothyroidism - Continue Synthroid  #. History of hypertension - Continue lisinopril   #. History of hyperlipidemia - Continue Zocor  Consult physical therapy   All the records are reviewed and case discussed with Care Management/Social Workerr. Management plans discussed with the patient, Daughter and granddaughter and they are in agreement. 2c director Ms. Darral Dash is present during the conversation  CODE STATUS: *fc   TOTAL TIME TAKING CARE OF THIS PATIENT: 43 minutes.   POSSIBLE D/C IN 1-2 DAYS, DEPENDING ON CLINICAL CONDITION.  Note: This dictation was prepared with Dragon dictation along with smaller phrase technology. Any transcriptional errors that result from this process are unintentional.   Ramonita Lab M.D on 03/29/2016 at 4:59 PM  Between 7am to 6pm - Pager - [phone] After 6pm go to [email or web address] - password [email or web address] [facility]  [place] [facility]  Office  [phone]  CC: Primary care physician; Tommie Sams, DO

## 2016-03-29 NOTE — Progress Notes (Signed)
The daughter c/o of hearing wheezing from pt.    Assessment made by the nruse and upon ausculation, pt has expiratory wheezing.  Contacted RT for a PRN breathing tmt.    Notified [facility] Dr. Durenda HurtKalisetti--orders placed to d/c fluids. Dluids d/c by RN.  Pt otherwise stable. Will continue to monitor and notify MD of any changes.

## 2016-03-29 NOTE — Progress Notes (Addendum)
Inpatient Diabetes Program Recommendations  AACE/ADA: New Consensus Statement on Inpatient Glycemic Control (2015)  Target Ranges:  Prepandial:   less than 140 mg/dL      Peak postprandial:   less than 180 mg/dL (1-2 hours)      Critically ill patients:  140 - 180 mg/dL   Lab Results  Component Value Date   GLUCAP 417 (H) 03/29/2016   HGBA1C 8.9 (H) 01/19/2016    Review of Glycemic Control  Results for [name], Amy Calhoun (MRN [id number]) as of 03/29/2016 08:43  Ref. Range 03/29/2016 00:18 03/29/2016 07:30  Glucose-Capillary Latest Ref Range: 65 - 99 mg/dL 409310 (H) 811417 (H)    Diabetes history: Type 2 Outpatient Diabetes medications: NPH 25 units qam, NPH 10 units qpm, R insulin  6 units tid, Tradjenta 5mg  qday  Current orders for Inpatient glycemic control: Levemir 25 units qam, Levemir 10 units qhs, Novolog 0-15 units tid, Novolog 0-5 units qhs, Tradjenta 5mg  q day  Inpatient Diabetes Program Recommendations:  Based on this patient's age and her renal function would recommend she receive IV insulin drip to safely bring down the blood sugar. Discussed this with the patient and daughter- daughter is not in favor of this stating, "just give her insulin, she's been an insulin dependant diabetic for 40 years- she needs her insulin"  Discussed with Dr. Amado CoeGouru- if unable to transfer to the unit for IV insulin suggest;  Increase Novolog correction to 0-20 units tid (may need to be decreased tomorrow since steroids have been d/c'd), add Novolog 6 units tid with meals.  It appears as if the steroids have been d/c'd so there is no need to increase the Levemir at this time.   Give Novolog 20 units stat.    Delay lunch time insulin and meal so lunch and insulin is given 4 hours after her current stat Novolog. (discussed with Dr. Amado CoeGouru and RN Archie Pattenonya)- supper may need to be delayed as well.   Susette RacerJulie Charm Stenner, RN, BA, MHA, CDE Diabetes Coordinator Inpatient Diabetes Program  [phone] (Team  Pager) [phone] [facility] Office) 03/29/2016 9:27 AM

## 2016-03-29 NOTE — Evaluation (Signed)
Physical Therapy Evaluation Patient Details Name: Amy Calhoun MRN: [id number] DOB: 06/24/1928 Today's Date: 03/29/2016   History of Present Illness  Pt is an 83 y.o.femalewith a known history of depression, dysphagia, gastric ulcer, hypertension, hyperlipidemia, hypothyroidism, type 2 diabetes, ischemic colitis, osteoarthritispresents to the emergency department. Patient describes sudden onset of dysuria described as pressure and burning as well as lower abdominal pain radiating to the right flank. She has had fevers, achiness, weakness..She also complained of some mild shortness of breath which improved with oxygen. She does not have any history of COPD or asthma.  Pt's diagnoses include: Pyelonephritis, dehydration with acute kidney injury, shortness of breath without hypoxia, and chest pain.    Clinical Impression  Pt presents with mild deficits in strength, transfers, gait, balance, and activity tolerance.  Pt Ind with bed mobility and SBA with transfers with good initial stability upon standing.  Baseline SpO2 on 2LO2/min 98% and HR 84 bpm with nursing giving approval for trial ambulation on room air.  Pt ambulated 1 x 40' and 1 x 100' without AD with CGA with some minimal drifting L/R and min A one time for stability.  Pt's SpO2 dropped to 93% and HR increased to low 90's after amb.  Nursing notified of results and pt returned to 2LO2/min.  Pt will benefit from PT services to address above deficits for decreased caregiver assistance upon discharge.       Follow Up Recommendations Home health PT    Equipment Recommendations  Rolling walker with 5" wheels    Recommendations for Other Services       Precautions / Restrictions Precautions Precautions: Fall Restrictions Weight Bearing Restrictions: No      Mobility  Bed Mobility Overal bed mobility: Modified Independent             General bed mobility comments: Use of bed rails  Transfers Overall transfer level:  Needs assistance   Transfers: Sit to/from Stand Sit to Stand: Supervision            Ambulation/Gait Ambulation/Gait assistance: Min guard Ambulation Distance (Feet): 100 Feet Assistive device: None Gait Pattern/deviations: Step-through pattern;Decreased step length - right;Decreased step length - left;Drifts right/left   Gait velocity interpretation: Below normal speed for age/gender General Gait Details: Slow cadence with giat without AD with mild L/R drifting and Min A one time for stability.  Stairs            Wheelchair Mobility    Modified Rankin (Stroke Patients Only)       Balance Overall balance assessment: Needs assistance Sitting-balance support: No upper extremity supported Sitting balance-Leahy Scale: Normal     Standing balance support: No upper extremity supported Standing balance-Leahy Scale: Good Standing balance comment: Fair to good static standing with feet apart, together, and semi-tandem                             Pertinent Vitals/Pain Pain Assessment: No/denies pain    Home Living Family/patient expects to be discharged to:: Private residence Living Arrangements: Alone Available Help at Discharge: Family;Available PRN/intermittently Type of Home: Apartment Home Access: Level entry     Home Layout: One level Home Equipment: Wheelchair - power;Cane - quad      Prior Function Level of Independence: Independent         Comments: Pt Ind with amb limited community distances without AD, uses a power w/c for longer community distances, one fall in last 12  months secondary to legs "buckling"     Hand Dominance   Dominant Hand: Right    Extremity/Trunk Assessment        Lower Extremity Assessment Lower Extremity Assessment: Generalized weakness       Communication   Communication: No difficulties  Cognition Arousal/Alertness: Awake/alert Behavior During Therapy: WFL for tasks assessed/performed Overall  Cognitive Status: Within Functional Limits for tasks assessed                      General Comments      Exercises Total Joint Exercises Ankle Circles/Pumps: AROM;Both;10 reps Quad Sets: Strengthening;Both;10 reps Gluteal Sets: Strengthening;Both;10 reps Straight Leg Raises: AROM;Both;10 reps Long Arc Quad: Strengthening;Both;10 reps Knee Flexion: Strengthening;Both;10 reps Other Exercises Other Exercises: Static balance training with feet apart, together, and semi-tandem with combinations of eyes open/closed and head still/head turns   Assessment/Plan    PT Assessment Patient needs continued PT services  PT Problem List Decreased strength;Decreased activity tolerance;Decreased balance       PT Treatment Interventions DME instruction;Gait training;Functional mobility training;Neuromuscular re-education;Balance training;Therapeutic exercise;Therapeutic activities;Patient/family education    PT Goals (Current goals can be found in the Care Plan section)  Acute Rehab PT Goals Patient Stated Goal: To get stronger and back to how I was PT Goal Formulation: With patient Time For Goal Achievement: 04/11/16 Potential to Achieve Goals: Good    Frequency Min 2X/week   Barriers to discharge        Co-evaluation               End of Session Equipment Utilized During Treatment: Gait belt;Oxygen Activity Tolerance: Patient tolerated treatment well Patient left: in bed;with bed alarm set;with call bell/phone within reach Nurse Communication: Mobility status;Other (comment) (Nursing appropved trial amb without O2 with SpO2 dropping from 98% on 2LO2/min to 93% with min SOB, pt returned to 2LO2/min) PT Visit Diagnosis: Unsteadiness on feet (R26.81);Muscle weakness (generalized) (M62.81)         Time: 1610-9604 PT Time Calculation (min) (ACUTE ONLY): 34 min   Charges:   PT Evaluation $PT Eval Low Complexity: 1 Procedure PT Treatments $Therapeutic Exercise: 8-22  mins   PT G Codes:         DElly Modena PT, DPT 03/29/16, 4:47 PM

## 2016-03-29 NOTE — Progress Notes (Signed)
MD paged for POCT BG of 417.  Per protocol STAT serum glucose ordered.   Pt asymptomatic at this time.  Daughter at bedside.  Will request to change diet from Heart Healthy to Carb Modified.  Will continue to monitor and notify MD of any changes.

## 2016-03-29 NOTE — Progress Notes (Signed)
Pharmacy Antibiotic Note  Amy Calhoun is a 83 y.o. female admitted on 03/28/2016 with pyelonephritis.  Pharmacy has been consulted for ceftriaxone dosing.  Plan: Ceftriaxone 2 g iv q 24 hours.   Height: 5\' 6"  (167.6 cm) Weight: 161 lb 11.2 oz (73.3 kg) IBW/kg (Calculated) : 59.3  Temp (24hrs), Avg:98.7 F (37.1 C), Min:98.1 F (36.7 C), Max:99.3 F (37.4 C)   Recent Labs Lab 03/28/16 1934  WBC 13.8*  CREATININE 1.63*    Estimated Creatinine Clearance: 25.9 mL/min (by C-G formula based on SCr of 1.63 mg/dL (H)).    Allergies  Allergen Reactions  . Pravastatin Other (See Comments)    States she refused due to side effects    Antimicrobials this admission: CTX 3/14 >>   Dose adjustments this admission:  Microbiology results: 3/14 UCx: pending   Thank you for allowing pharmacy to be a part of this patient's care.  Luisa HartChristy, Harvey Lingo D 03/29/2016 12:15 AM

## 2016-03-30 LAB — CBC
HEMATOCRIT: 30.4 % — AB (ref 35.0–47.0)
HEMOGLOBIN: 10.1 g/dL — AB (ref 12.0–16.0)
MCH: 29.3 pg (ref 26.0–34.0)
MCHC: 33.3 g/dL (ref 32.0–36.0)
MCV: 88.1 fL (ref 80.0–100.0)
Platelets: 151 10*3/uL (ref 150–440)
RBC: 3.45 MIL/uL — AB (ref 3.80–5.20)
RDW: 15 % — AB (ref 11.5–14.5)
WBC: 14.2 10*3/uL — AB (ref 3.6–11.0)

## 2016-03-30 LAB — BASIC METABOLIC PANEL
Anion gap: 5 (ref 5–15)
BUN: 43 mg/dL — AB (ref 6–20)
CHLORIDE: 110 mmol/L (ref 101–111)
CO2: 24 mmol/L (ref 22–32)
Calcium: 7.5 mg/dL — ABNORMAL LOW (ref 8.9–10.3)
Creatinine, Ser: 1.3 mg/dL — ABNORMAL HIGH (ref 0.44–1.00)
GFR calc Af Amer: 42 mL/min — ABNORMAL LOW (ref >60)
GFR calc non Af Amer: 36 mL/min — ABNORMAL LOW (ref >60)
Glucose, Bld: 158 mg/dL — ABNORMAL HIGH (ref 65–99)
POTASSIUM: 4.1 mmol/L (ref 3.5–5.1)
SODIUM: 139 mmol/L (ref 135–145)

## 2016-03-30 LAB — GLUCOSE, CAPILLARY
GLUCOSE-CAPILLARY: 192 mg/dL — AB (ref 65–99)
GLUCOSE-CAPILLARY: 212 mg/dL — AB (ref 65–99)
Glucose-Capillary: 143 mg/dL — ABNORMAL HIGH (ref 65–99)
Glucose-Capillary: 238 mg/dL — ABNORMAL HIGH (ref 65–99)

## 2016-03-30 MED ORDER — INSULIN ASPART 100 UNIT/ML ~~LOC~~ SOLN
5.0000 [IU] | Freq: Three times a day (TID) | SUBCUTANEOUS | Status: DC
  Administered 2016-03-30 – 2016-03-31 (×3): 5 [IU] via SUBCUTANEOUS
  Filled 2016-03-30 (×3): qty 5

## 2016-03-30 MED ORDER — INSULIN ASPART 100 UNIT/ML ~~LOC~~ SOLN
0.0000 [IU] | Freq: Three times a day (TID) | SUBCUTANEOUS | Status: DC
  Administered 2016-03-30: 2 [IU] via SUBCUTANEOUS
  Administered 2016-03-30: 3 [IU] via SUBCUTANEOUS
  Administered 2016-03-31: 2 [IU] via SUBCUTANEOUS
  Filled 2016-03-30: qty 3
  Filled 2016-03-30 (×2): qty 2

## 2016-03-30 NOTE — Care Management Important Message (Signed)
Important Message  Patient Details  Name: Amy Calhoun MRN: [id number] Date of Birth: 09/24/1928   Medicare Important Message Given:  Yes    Chapman FitchBOWEN, Sabria Florido T, RN 03/30/2016, 3:48 PM

## 2016-03-30 NOTE — Progress Notes (Signed)
[facility] - [place] at [facility]   PATIENT NAME: Amy MuirRosetta Gloster    MR#:  [id number]  DATE OF BIRTH:  05/10/1928  SUBJECTIVE:  CHIEF COMPLAINT:  Patient's Resting comfortably and denies any complaints daughter at bedside  REVIEW OF SYSTEMS:  CONSTITUTIONAL: No fever, fatigue or weakness.  EYES: No blurred or double vision.  EARS, NOSE, AND THROAT: No tinnitus or ear pain.  RESPIRATORY: No cough, shortness of breath, wheezing or hemoptysis.  CARDIOVASCULAR: No chest pain, orthopnea, edema.  GASTROINTESTINAL: No nausea, vomiting, diarrhea or abdominal pain.  GENITOURINARY: No dysuria, hematuria.  ENDOCRINE: No polyuria, nocturia,  HEMATOLOGY: No anemia, easy bruising or bleeding SKIN: No rash or lesion. MUSCULOSKELETAL: Reports chronic history of arthritis.   NEUROLOGIC: No tingling, numbness, weakness.  PSYCHIATRY: No anxiety or depression.   DRUG ALLERGIES:   Allergies  Allergen Reactions  . Pravastatin Other (See Comments)    States she refused due to side effects    VITALS:  Blood pressure (!) 140/58, pulse 88, temperature 98.6 F (37 C), temperature source Oral, resp. rate 18, height 5\' 6"  (1.676 m), weight 73.3 kg (161 lb 11.2 oz), SpO2 94 %.  PHYSICAL EXAMINATION:  GENERAL:  83 y.o.-year-old patient lying in the bed with no acute distress.  EYES: Pupils equal, round, reactive to light and accommodation. No scleral icterus. Extraocular muscles intact.  HEENT: Head atraumatic, normocephalic. Oropharynx and nasopharynx clear.  NECK:  Supple, no jugular venous distention. No thyroid enlargement, no tenderness.  LUNGS: Normal breath sounds bilaterally, no wheezing, rales,rhonchi or crepitation. No use of accessory muscles of respiration.  CARDIOVASCULAR: S1, S2 normal. No murmurs, rubs, or gallops.  ABDOMEN: Soft, nontender, nondistended. Bowel sounds present. No organomegaly or mass.  EXTREMITIES: No pedal edema, cyanosis, or clubbing.   NEUROLOGIC: Cranial nerves II through XII are intact. Muscle strength 5/5 in all extremities. Sensation intact. Gait not checked.  PSYCHIATRIC: The patient is alert and oriented x 3.  SKIN: No obvious rash, lesion, or ulcer.    LABORATORY PANEL:   CBC  Recent Labs Lab 03/30/16 0447  WBC 14.2*  HGB 10.1*  HCT 30.4*  PLT 151   ------------------------------------------------------------------------------------------------------------------  Chemistries   Recent Labs Lab 03/30/16 0447  NA 139  K 4.1  CL 110  CO2 24  GLUCOSE 158*  BUN 43*  CREATININE 1.30*  CALCIUM 7.5*   ------------------------------------------------------------------------------------------------------------------  Cardiac Enzymes  Recent Labs Lab 03/29/16 1411  TROPONINI <0.03   ------------------------------------------------------------------------------------------------------------------  RADIOLOGY:  Dg Chest 2 View  Result Date: 03/28/2016 CLINICAL DATA:  83 year old female with shortness of breath. EXAM: CHEST  2 VIEW COMPARISON:  Chest radiograph dated 03/01/2016 FINDINGS: Stable mild eventration of the right hemidiaphragm. The lungs are clear. There is no pleural effusion or pneumothorax. The cardiac silhouette is within normal limits. The aorta is mildly tortuous. There is atherosclerotic calcification of the aortic arch. Old healed left posterior rib fractures. No acute fracture identified. IMPRESSION: No active cardiopulmonary disease. Electronically Signed   By: Elgie CollardArash  Radparvar M.D.   On: 03/28/2016 20:03   Ct Renal Stone Study  Result Date: 03/28/2016 CLINICAL DATA:  Acute right flank pain. EXAM: CT ABDOMEN AND PELVIS WITHOUT CONTRAST TECHNIQUE: Multidetector CT imaging of the abdomen and pelvis was performed following the standard protocol without IV contrast. COMPARISON:  None. FINDINGS: Lower chest: Mitral valve and coronary artery calcifications. Heart is normal size. No  confluent opacity or effusion. Hepatobiliary: Prior cholecystectomy. Mild intrahepatic and extrahepatic biliary ductal dilatation, likely related to  post cholecystectomy state. Pancreas: Fatty replacement. No focal abnormality or ductal dilatation. Spleen: No focal abnormality.  Normal size. Adrenals/Urinary Tract: Adrenal glands are unremarkable. No renal or ureteral stones. No hydronephrosis. Small amount of gas anteriorly within the urinary bladder, presumably from catheterization. Bladder is unremarkable. Stomach/Bowel: Large stool burden in the colon. Stomach, large and small bowel grossly unremarkable. Vascular/Lymphatic: Aortic and iliac calcifications. No aneurysm. No adenopathy. Reproductive: Prior hysterectomy.  No adnexal masses. Other: No free fluid or free air. Stranding in the subcutaneous soft tissues in the right anterior abdominal wall, possibly related to subcutaneous injections. Recommend clinical correlation. Musculoskeletal: Degenerative changes in the lumbar spine. IMPRESSION: Moderate large stool burden in the colon. Prior cholecystectomy and hysterectomy. No renal or ureteral stones.  No hydronephrosis. Aorta iliac atherosclerosis. Electronically Signed   By: Charlett Nose M.D.   On: 03/28/2016 20:54    EKG:   Orders placed or performed during the hospital encounter of 03/28/16  . ED EKG  . ED EKG  . EKG 12-Lead    ASSESSMENT AND PLAN:   This is a 83 y.o. female with a history of depression, dysphagia, gastric ulcer, hypertension, hyperlipidemia, hypothyroidism, type 2 diabetes, ischemic colitis, osteoarthritis now being admitted with:  #. Acute Pyelonephritis - IV Rocephin, IV fluids - Follow up urine culturesRevealed greater than 100,000 colonies, final sensitivities pending  #. Dehydration with acute kidney injury -Clinically improving with IV fluids - creatinine 1.6 --1.30 - Avoid nephrotoxic medications - Bladder scan and place foley catheter if evidence of  urinary retention  #. Shortness of breath without hypoxia. Could be from anxiety- Shortness of breath significantly improved ontinue O2 and nebulizer therapy as needed   #Steroid-induced hyperglycemia with history of diabetes mellitus -Significantly improved Patient just finished outpatient steroid tapping dose and has received IV Solu-Medrol once  Continue  Levemir 25 units daily a.m. and 10 units daily at bedtime  and on low dose sliding scale Follow-up with diabetic coordinator  #. Chest pain, ruled out ACS - Telemetry monitoring. Acute MI ruled out with negative troponins 3 -TSH is below normal free T4 is elevated 63 which is pending  #. H/o Diabetes - Accuchecks achs with ISS coverage - Heart healthy, carb controlled diet -Continue Tradjenta,  Levemir  #. History of hypothyroidism - Continue Synthroid  #. History of hypertension - Continue lisinopril   #. History of hyperlipidemia - Continue Zocor  Consult physical therapy-recommending home health PT, will work with 5 inch wheels   All the records are reviewed and case discussed with Care Management/Social Workerr. Management plans discussed with the patient, Daughter and granddaughter and they are in agreement. 2c director Ms. Darral Dash is present during the conversation  CODE STATUS: *fc   TOTAL TIME TAKING CARE OF THIS PATIENT: .   POSSIBLE D/C IN am DAYS, DEPENDING ON CLINICAL CONDITION.  Note: This dictation was prepared with Dragon dictation along with smaller phrase technology. Any transcriptional errors that result from this process are unintentional.   Ramonita Lab M.D on 03/30/2016 at 2:44 PM  Between 7am to 6pm - Pager - [phone] After 6pm go to [email or web address] - password [email or web address] [facility]  [place] [facility]  Office  [phone]  CC: Primary care physician; Tommie Sams, DO

## 2016-03-30 NOTE — Progress Notes (Signed)
Inpatient Diabetes Program Recommendations  AACE/ADA: New Consensus Statement on Inpatient Glycemic Control (2015)  Target Ranges:  Prepandial:   less than 140 mg/dL      Peak postprandial:   less than 180 mg/dL (1-2 hours)      Critically ill patients:  140 - 180 mg/dL   Lab Results  Component Value Date   GLUCAP 143 (H) 03/30/2016   HGBA1C 8.9 (H) 01/19/2016     Diabetes history: Type 2 Outpatient Diabetes medications: NPH 25 units qam, NPH 10 units qpm, R insulin  6 units tid, Tradjenta 5mg  qday  Current orders for Inpatient glycemic control: Levemir 25 units qam, Levemir 10 units qhs, Novolog 0-20 units tid, Novolog 0-5 units qhs, Tradjenta 5mg  q day  Inpatient Diabetes Program Recommendations:   Since the patient off of steroids, consider decreasing Novolog correction to sensitive 0-9 tid   Add Novolog 5 units tid with meals (hold if patient eats less than 50%).   Susette RacerJulie Audon Heymann, RN, BA, MHA, CDE Diabetes Coordinator Inpatient Diabetes Program  [phone] (Team Pager) [phone] [facility] Office) 03/30/2016 8:50 AM

## 2016-03-31 DIAGNOSIS — N179 Acute kidney failure, unspecified: Secondary | ICD-10-CM

## 2016-03-31 DIAGNOSIS — R0600 Dyspnea, unspecified: Secondary | ICD-10-CM

## 2016-03-31 DIAGNOSIS — N10 Acute tubulo-interstitial nephritis: Secondary | ICD-10-CM

## 2016-03-31 DIAGNOSIS — R739 Hyperglycemia, unspecified: Secondary | ICD-10-CM

## 2016-03-31 DIAGNOSIS — R079 Chest pain, unspecified: Secondary | ICD-10-CM

## 2016-03-31 DIAGNOSIS — R0602 Shortness of breath: Secondary | ICD-10-CM

## 2016-03-31 DIAGNOSIS — T380X5A Adverse effect of glucocorticoids and synthetic analogues, initial encounter: Secondary | ICD-10-CM

## 2016-03-31 DIAGNOSIS — N189 Chronic kidney disease, unspecified: Secondary | ICD-10-CM

## 2016-03-31 LAB — URINE CULTURE: Culture: >=100000 — AB

## 2016-03-31 LAB — GLUCOSE, CAPILLARY: Glucose-Capillary: 190 mg/dL — ABNORMAL HIGH (ref 65–99)

## 2016-03-31 LAB — HEMOGLOBIN A1C
Hgb A1c MFr Bld: 7.4 % — ABNORMAL HIGH (ref 4.8–5.6)
MEAN PLASMA GLUCOSE: 166 mg/dL

## 2016-03-31 MED ORDER — DOCUSATE SODIUM 100 MG PO CAPS: 100.0000 mg | ORAL_CAPSULE | Freq: Two times a day (BID) | ORAL | Status: DC

## 2016-03-31 MED ORDER — BISACODYL 10 MG RE SUPP: 10.0000 mg | Freq: Every day | RECTAL | Status: DC

## 2016-03-31 MED ORDER — POLYETHYLENE GLYCOL 3350 17 G PO PACK: 17.0000 g | PACK | Freq: Every day | ORAL | 0 refills | Status: DC

## 2016-03-31 MED ORDER — CEPHALEXIN 500 MG PO CAPS: 500.0000 mg | ORAL_CAPSULE | Freq: Two times a day (BID) | ORAL | 0 refills | Status: DC

## 2016-03-31 MED ORDER — SENNOSIDES-DOCUSATE SODIUM 8.6-50 MG PO TABS: 1.0000 | ORAL_TABLET | Freq: Every day | ORAL | 5 refills | Status: DC

## 2016-03-31 MED ORDER — DOCUSATE SODIUM 100 MG PO CAPS: 100.0000 mg | ORAL_CAPSULE | Freq: Two times a day (BID) | ORAL | 0 refills | Status: DC

## 2016-03-31 NOTE — Discharge Summary (Signed)
[facility] - [place] at [facility]   PATIENT NAME: Amy Calhoun    MR#:  [id number]  DATE OF BIRTH:  10/29/1928  DATE OF ADMISSION:  03/28/2016 ADMITTING PHYSICIAN: Tonye Royalty, DO  DATE OF DISCHARGE: 03/31/2016  1:09 PM  PRIMARY CARE PHYSICIAN: Tommie Sams, DO     ADMISSION DIAGNOSIS:  Shortness of breath [R06.02] Pyelonephritis [N12]  DISCHARGE DIAGNOSIS:  Active Problems:   Constipated   Acute pyelonephritis   Acute on chronic renal failure (HCC)   Steroid-induced hyperglycemia   Dyspnea   Chest pain   SECONDARY DIAGNOSIS:   Past Medical History:  Diagnosis Date  . Depression   . Esophageal dysphagia   . Gastric ulcer   . Hyperlipidemia   . Hypertension   . Hypothyroidism   . IDDM (insulin dependent diabetes mellitus) (HCC)   . Ischemic colitis (HCC)   . Osteoarthritis   . Ulcer of esophagus     .pro HOSPITAL COURSE:   The patient is 83 year old female with medical history significant for history of depression, dysphagia, gastric ulcer, hypertension, hyperlipidemia, hypothyroidism, diabetes mellitus type 2, ischemic colitis, osteoarthritis, who presented to the hospital with complaints of sudden onset of dysuria, pressure and burning sensation in lower abdomen, radiating to right flank. She also had fevers, achiness, weakness, mild shortness of breath, which improved his oxygen therapy. Patient's labs revealed hyponatremia, severe hyperglycemia, acute renal insufficiency with creatinine of 1.63, elevated white blood cell count was also noted to 16.3. Urinalysis revealed too numerous to count white blood cells, white cell clumps. Urine culture show Escherichia coli, pansensitive. CT of abdomen and pelvis without contrast revealed moderate stool burden in the colon. Patient was managed with Rocephin in the hospital, which was changed to Keflex on the day of discharge. Patient was advised to continue antibiotic therapy for 5 days to  complete course. She was felt to be stable to be discharged home with resumption of home health services Discussion by problem: #1. Acute pyelonephritis, due to Escherichia coli, blood cultures weretaken, patient is to continue antibiotic therapy with Keflex for 5 more days to complete course #2. Acute on chronic renal insufficiency, improved with IV fluids, now at baseline, creatinine 1.3 on the day of discharge #3. Leukocytosis, follow with antibiotic therapy, improved #4. Dyspnea of unclear etiology, chest x-ray was normal, oxygenation was 96% on 2 L of oxygen through nasal cannula, at baseline #5. Steroid-induced hyperglycemia with history of diabetes mellitus, improved with tapering steroid doses, patient is to continue Levemir and follow-up primary care physician. Hemoglobin A1c 7.4 #6. Chest pain, acute MI was ruled out with negative troponins 3, no arrhythmias on telemetry, resolved #7. History of hypothyroidism, TSH was found to be low at 0.136, free T4 slightly elevated at 1.42, needs to decrease Synthroid dose, to be done as outpatient #8. History of hypertension , continue lisinopril, blood pressure was well controlled #9. History of hyperlipidemia, continue Zocor #10. Generalized weakness, physical therapist evaluated patient and recommended home health services, resume home health services upon discharge DISCHARGE CONDITIONS:  Stable  CONSULTS OBTAINED:    DRUG ALLERGIES:   Allergies  Allergen Reactions  . Pravastatin Other (See Comments)    States she refused due to side effects    DISCHARGE MEDICATIONS:   Discharge Medication List as of 03/31/2016 11:32 AM    CONTINUE these medications which have CHANGED   Details  cephALEXin (KEFLEX) 500 MG capsule Take 1 capsule (500 mg total) by mouth 2 (two)  times daily., Starting Sat 03/31/2016, Print    docusate sodium (COLACE) 100 MG capsule Take 1 capsule (100 mg total) by mouth 2 (two) times daily., Starting Sat 03/31/2016,  Normal    polyethylene glycol (MIRALAX) packet Take 17 g by mouth daily., Starting Sat 03/31/2016, Print    senna-docusate (SENOKOT-S) 8.6-50 MG tablet Take 1 tablet by mouth at bedtime., Starting Sat 03/31/2016, Print      CONTINUE these medications which have NOT CHANGED   Details  acetaminophen-codeine (TYLENOL #3) 300-30 MG tablet Take 1 tablet by mouth every 6 (six) hours as needed for moderate pain. , Starting Thu 01/19/2016, Historical Med    amitriptyline (ELAVIL) 75 MG tablet Take 2 tablets (150 mg total) by mouth at bedtime., Starting Mon 02/13/2016, No Print    BD INSULIN SYRINGE ULTRAFINE 31G X 15/64" 0.5 ML MISC Starting Wed 02/22/2016, Historical Med    Cholecalciferol (VITAMIN D) 2000 units CAPS Take 2,000 Units by mouth daily. , Historical Med    gabapentin (NEURONTIN) 100 MG capsule Take 2 capsules (200 mg total) by mouth 3 (three) times daily., Starting Tue 03/27/2016, Normal    glucagon (GLUCAGON EMERGENCY) 1 MG injection Inject 1 mg into the vein once as needed. For hypoglycemic episodes. E11.42, Starting Tue 03/13/2016, Normal    glucose blood (ONETOUCH VERIO) test strip USE AS INSTRUCTED to check sugars up to four times daily. E11.9, Normal    insulin NPH Human (HUMULIN N,NOVOLIN N) 100 UNIT/ML injection 30 units every morning and 10 units at night, No Print    insulin regular (NOVOLIN R,HUMULIN R) 250 units/2.53mL (100 units/mL) injection Inject 0.06 mLs (6 Units total) into the skin 3 (three) times daily before meals., Starting Mon 02/13/2016, No Print    INSULIN SYRINGE 1CC/29G 29G X 1/2" 1 ML MISC As directed to administer insulin, Normal    levothyroxine (SYNTHROID, LEVOTHROID) 175 MCG tablet Take 1 tablet (175 mcg total) by mouth daily before breakfast., Starting Sun 01/22/2016, Normal    lidocaine (LIDODERM) 5 % Place 1 patch onto the skin daily., Starting Fri 03/02/2016, Normal    linagliptin (TRADJENTA) 5 MG TABS tablet Take 1 tablet (5 mg total) by mouth daily.,  Starting Tue 02/21/2016, Normal    lisinopril (PRINIVIL,ZESTRIL) 10 MG tablet Take 1 tablet (10 mg total) by mouth daily., Starting Thu 01/19/2016, Normal    omeprazole (PRILOSEC) 40 MG capsule Take 1 capsule (40 mg total) by mouth daily., Starting Thu 01/19/2016, Normal    pyridOXINE (VITAMIN B-6) 100 MG tablet Take 100 mg by mouth daily., Historical Med    riboflavin (VITAMIN B-2) 100 MG TABS tablet Take 100 mg by mouth daily., Historical Med    simvastatin (ZOCOR) 40 MG tablet Take 40 mg by mouth at bedtime. , Historical Med         DISCHARGE INSTRUCTIONS:    The patient is to follow-up with primary care physician within one week after discharge  If you experience worsening of your admission symptoms, develop shortness of breath, life threatening emergency, suicidal or homicidal thoughts you must seek medical attention immediately by calling 911 or calling your MD immediately  if symptoms less severe.  You Must read complete instructions/literature along with all the possible adverse reactions/side effects for all the Medicines you take and that have been prescribed to you. Take any new Medicines after you have completely understood and accept all the possible adverse reactions/side effects.   Please note  You were cared for by a hospitalist during your hospital  stay. If you have any questions about your discharge medications or the care you received while you were in the hospital after you are discharged, you can call the unit and asked to speak with the hospitalist on call if the hospitalist that took care of you is not available. Once you are discharged, your primary care physician will handle any further medical issues. Please note that NO REFILLS for any discharge medications will be authorized once you are discharged, as it is imperative that you return to your primary care physician (or establish a relationship with a primary care physician if you do not have one) for your aftercare  needs so that they can reassess your need for medications and monitor your lab values.    Today   CHIEF COMPLAINT:   Chief Complaint  Patient presents with  . Dysuria  . Fever  . Shortness of Breath  . Weakness    HISTORY OF PRESENT ILLNESS:  Amy Calhoun  is a 83 y.o. female with a known history of depression, dysphagia, gastric ulcer, hypertension, hyperlipidemia, hypothyroidism, diabetes mellitus type 2, ischemic colitis, osteoarthritis, who presented to the hospital with complaints of sudden onset of dysuria, pressure and burning sensation in lower abdomen, radiating to right flank. She also had fevers, achiness, weakness, mild shortness of breath, which improved his oxygen therapy. Patient's labs revealed hyponatremia, severe hyperglycemia, acute renal insufficiency with creatinine of 1.63, elevated white blood cell count was also noted to 16.3. Urinalysis revealed too numerous to count white blood cells, white cell clumps. Urine culture show Escherichia coli, pansensitive. CT of abdomen and pelvis without contrast revealed moderate stool burden in the colon. Patient was managed with Rocephin in the hospital, which was changed to Keflex on the day of discharge. Patient was advised to continue antibiotic therapy for 5 days to complete course. She was felt to be stable to be discharged home with resumption of home health services Discussion by problem: #1. Acute pyelonephritis, due to Escherichia coli, blood cultures weretaken, patient is to continue antibiotic therapy with Keflex for 5 more days to complete course #2. Acute on chronic renal insufficiency, improved with IV fluids, now at baseline, creatinine 1.3 on the day of discharge #3. Leukocytosis, follow with antibiotic therapy, improved #4. Dyspnea of unclear etiology, chest x-ray was normal, oxygenation was 96% on 2 L of oxygen through nasal cannula, at baseline #5. Steroid-induced hyperglycemia with history of diabetes mellitus,  improved with tapering steroid doses, patient is to continue Levemir and follow-up primary care physician. Hemoglobin A1c 7.4 #6. Chest pain, acute MI was ruled out with negative troponins 3, no arrhythmias on telemetry, resolved #7. History of hypothyroidism, TSH was found to be low at 0.136, free T4 slightly elevated at 1.42, needs to decrease Synthroid dose, to be done as outpatient #8. History of hypertension , continue lisinopril, blood pressure was well controlled #9. History of hyperlipidemia, continue Zocor #10. Generalized weakness, physical therapist evaluated patient and recommended home health services, resume home health services upon discharge    VITAL SIGNS:  Blood pressure (!) 133/57, pulse 84, temperature 98 F (36.7 C), temperature source Oral, resp. rate 18, height 5\' 6"  (1.676 m), weight 73.3 kg (161 lb 11.2 oz), SpO2 96 %.  I/O:   Intake/Output Summary (Last 24 hours) at 03/31/16 1602 Last data filed at 03/31/16 1026  Gross per 24 hour  Intake              530 ml  Output  1900 ml  Net            -1370 ml    PHYSICAL EXAMINATION:  GENERAL:  83 y.o.-year-old patient lying in the bed with no acute distress.  EYES: Pupils equal, round, reactive to light and accommodation. No scleral icterus. Extraocular muscles intact.  HEENT: Head atraumatic, normocephalic. Oropharynx and nasopharynx clear.  NECK:  Supple, no jugular venous distention. No thyroid enlargement, no tenderness.  LUNGS: Normal breath sounds bilaterally, no wheezing, rales,rhonchi or crepitation. No use of accessory muscles of respiration.  CARDIOVASCULAR: S1, S2 normal. No murmurs, rubs, or gallops.  ABDOMEN: Soft, non-tender, non-distended. Bowel sounds present. No organomegaly or mass.  EXTREMITIES: No pedal edema, cyanosis, or clubbing.  NEUROLOGIC: Cranial nerves II through XII are intact. Muscle strength 5/5 in all extremities. Sensation intact. Gait not checked.  PSYCHIATRIC: The  patient is alert and oriented x 3.  SKIN: No obvious rash, lesion, or ulcer.   DATA REVIEW:   CBC  Recent Labs Lab 03/30/16 0447  WBC 14.2*  HGB 10.1*  HCT 30.4*  PLT 151    Chemistries   Recent Labs Lab 03/30/16 0447  NA 139  K 4.1  CL 110  CO2 24  GLUCOSE 158*  BUN 43*  CREATININE 1.30*  CALCIUM 7.5*    Cardiac Enzymes  Recent Labs Lab 03/29/16 1411  TROPONINI <0.03    Microbiology Results  Results for orders placed or performed during the hospital encounter of 03/28/16  Urine culture     Status: Abnormal   Collection Time: 03/28/16  8:08 PM  Result Value Ref Range Status   Specimen Description URINE, RANDOM  Final   Special Requests NONE  Final   Culture >=100,000 COLONIES/mL ESCHERICHIA COLI (A)  Final   Report Status 03/31/2016 FINAL  Final   Organism ID, Bacteria ESCHERICHIA COLI (A)  Final      Susceptibility   Escherichia coli - MIC*    AMPICILLIN <=2 SENSITIVE Sensitive     CEFAZOLIN <=4 SENSITIVE Sensitive     CEFTRIAXONE <=1 SENSITIVE Sensitive     CIPROFLOXACIN <=0.25 SENSITIVE Sensitive     GENTAMICIN <=1 SENSITIVE Sensitive     IMIPENEM <=0.25 SENSITIVE Sensitive     NITROFURANTOIN <=16 SENSITIVE Sensitive     TRIMETH/SULFA <=20 SENSITIVE Sensitive     AMPICILLIN/SULBACTAM <=2 SENSITIVE Sensitive     PIP/TAZO <=4 SENSITIVE Sensitive     Extended ESBL NEGATIVE Sensitive     * >=100,000 COLONIES/mL ESCHERICHIA COLI    RADIOLOGY:  No results found.  EKG:   Orders placed or performed during the hospital encounter of 03/28/16  . ED EKG  . ED EKG  . EKG 12-Lead      Management plans discussed with the patient, family and they are in agreement.  CODE STATUS:     Code Status Orders        Start     Ordered   03/28/16 2357  Full code  Continuous     03/28/16 2356    Code Status History    Date Active Date Inactive Code Status Order ID Comments User Context   This patient has a current code status but no historical code  status.      TOTAL TIME TAKING CARE OF THIS PATIENT: 40 minutes.    Katharina Caper M.D on 03/31/2016 at 4:02 PM  Between 7am to 6pm - Pager - [phone]  After 6pm go to [email or web address] - password [facility]  Office  [phone]  CC: Primary care physician; Tommie SamsJayce G Cook, DO

## 2016-03-31 NOTE — Care Management Note (Addendum)
Case Management Note  Patient Details  Name: Amy Calhoun MRN: [id number] Date of Birth: 01/03/1929  Subjective/Objective:      Discussed discharge planning with Amy Calhoun daughter Amy Calhoun. Amy Calhoun refused Calhoun RW for Amy Calhoun stating "I've told you before, she does not need Calhoun walker." Daughter also declined offer of Calhoun referral to [facility] for home PT and reported that Amy Calhoun currently sees Amy Calhoun [facility] in [place] for [place], and plans to continue to do so.             Action/Plan:   Expected Discharge Date:  03/31/16               Expected Discharge Plan:     In-House Referral:     Discharge planning Services     Post Acute Care Choice:    Choice offered to:     DME Arranged:    DME Agency:     HH Arranged:    HH Agency:     Status of Service:     If discussed at [employer] Meetings, dates discussed:    Additional Comments:  Amy Vanderwoude A, RN 03/31/2016, 11:36 AM

## 2016-03-31 NOTE — Progress Notes (Signed)
Physical Therapy Treatment Patient Details Name: Amy Calhoun MRN: [id number] DOB: [birth year] Today's Date: 03/31/2016    History of Present Illness Pt is an 83 y.o.femalewith a known history of depression, dysphagia, gastric ulcer, hypertension, hyperlipidemia, hypothyroidism, type 2 diabetes, ischemic colitis, osteoarthritispresents to the emergency department. Patient describes sudden onset of dysuria described as pressure and burning as well as lower abdominal pain radiating to the right flank. She has had fevers, achiness, weakness..She also complained of some mild shortness of breath which improved with oxygen. She does not have any history of COPD or asthma.  Pt's diagnoses include: Pyelonephritis, dehydration with acute kidney injury, shortness of breath without hypoxia, and chest pain.    PT Comments    Bed mobility with ease.  Pt was able to stand and ambulate 160' with no AD and min guard.  Pt stated she uses SBQC at home "when I feel like I need it" but primarily uses scooter in facility.  Pt did have one small LOB which she corrected without assist.     Follow Up Recommendations  Home health PT     Equipment Recommendations  Rolling walker with 5" wheels    Recommendations for Other Services       Precautions / Restrictions Precautions Precautions: Fall Restrictions Weight Bearing Restrictions: No    Mobility  Bed Mobility Overal bed mobility: Modified Independent             General bed mobility comments: Use of bed rails  Transfers Overall transfer level: Needs assistance   Transfers: Sit to/from Stand Sit to Stand: Supervision            Ambulation/Gait Ambulation/Gait assistance: Min guard Ambulation Distance (Feet): 160 Feet Assistive device: None Gait Pattern/deviations: Step-through pattern;Decreased step length - right;Decreased step length - left;Drifts right/left   Gait velocity interpretation: Below normal speed for  age/gender General Gait Details: slow cadence, decreased step height scuffine left foot x 1, recovered LOB without assist   Stairs            Wheelchair Mobility    Modified Rankin (Stroke Patients Only)       Balance Overall balance assessment: Needs assistance Sitting-balance support: No upper extremity supported Sitting balance-Leahy Scale: Normal     Standing balance support: No upper extremity supported Standing balance-Leahy Scale: Good                      Cognition Arousal/Alertness: Awake/alert Behavior During Therapy: WFL for tasks assessed/performed Overall Cognitive Status: Within Functional Limits for tasks assessed                      Exercises      General Comments        Pertinent Vitals/Pain Pain Assessment: No/denies pain    Home Living                      Prior Function            PT Goals (current goals can now be found in the care plan section) Progress towards PT goals: Progressing toward goals    Frequency    Min 2X/week      PT Plan      Co-evaluation             End of Session Equipment Utilized During Treatment: Gait belt Activity Tolerance: Patient tolerated treatment well Patient left: in chair;with chair alarm set;with call bell/phone within reach  Time: 0921-0929 PT Time Calculation (min) (ACUTE ONLY): 8 min  Charges:  $Gait Training: 8-22 mins                    G Codes:       Danielle Dess 04/21/2016, 10:40 AM

## 2016-03-31 NOTE — Progress Notes (Signed)
Patient discharged to home as ordered. IV discontinued site clean dry and intact. Follow up appointments given as ordered. Patient's daughter given prescriptions for antibiotic and laxatives and stool softner as ordered.Patient daughter at the bedside to take patient home and given the laxatives to her mother when she gets home. Patient denies pain at this time.

## 2016-04-01 LAB — T3: T3, Total: 66 ng/dL — ABNORMAL LOW (ref 71–180)

## 2016-04-02 ENCOUNTER — Telehealth: Payer: Self-pay | Admitting: Family Medicine

## 2016-04-02 ENCOUNTER — Other Ambulatory Visit: Payer: Medicare HMO

## 2016-04-02 NOTE — Telephone Encounter (Signed)
Pt daughter called about pt needing to recheck her Thyroid. Pt needs order for lab. Pt medication was held so her lab work can be recheck. Please advise?  Call daughter @ [phone] or [phone]. Thank you!

## 2016-04-02 NOTE — Telephone Encounter (Signed)
Is she having symptoms?

## 2016-04-02 NOTE — Telephone Encounter (Signed)
ED told pts daughter to hold thyroid medication until today and have it rechecked today. Orders need to be placed. Pt coming in at 345 p.m.

## 2016-04-02 NOTE — Telephone Encounter (Signed)
A voicemail was left letting daughter know that a new rx will be sent for 150 mcg.

## 2016-04-02 NOTE — Telephone Encounter (Signed)
Per DPR the pts daughter was called and a voicemail was left stating a new rx was being called in for a decreased dose of thyroid medication. Pt would need a lab recheck in 4-6 weeks.

## 2016-04-02 NOTE — Telephone Encounter (Signed)
Yes. I will send in Rx when I can.

## 2016-04-02 NOTE — Telephone Encounter (Signed)
Needs dose reduction and recheck in 4-6 weeks. No need to repeat now.

## 2016-04-02 NOTE — Telephone Encounter (Signed)
Pt daughter called back and wanted to know if the medication was being reduced to 150 mcg. Please advise, thank you!  [facility] @ [phone]

## 2016-04-03 ENCOUNTER — Telehealth: Payer: Self-pay | Admitting: Family Medicine

## 2016-04-03 MED ORDER — LEVOTHYROXINE SODIUM 150 MCG PO TABS: 150.0000 ug | ORAL_TABLET | Freq: Every day | ORAL | 3 refills | Status: DC

## 2016-04-03 NOTE — Telephone Encounter (Signed)
Transition Care Management Follow-up Telephone Call  How have you been since you were released from the hospital? Feels about the same   Do you understand why you were in the hospital? Yes, I had Kidney infection.   Do you understand the discharge instrcutions? NO , Not really, I just looked at them and threw them away.  Items Reviewed:  Medications reviewed: Patient does not know her medication daughter prepares medication.  Allergies reviewed: Yes,  Dietary changes reviewed: Yes no change.  Referrals reviewed: Yes,    Functional Questionnaire:   Activities of Daily Living (ADLs):   She states they are independent in the following: Bathing , clothing and able eat meals, and prepare lite meals. States they require assistance with the following:  Patient requires assist with medication daughter assist's patient with medication.   Any transportation issues/concerns?: NO, daughter will bring patient to appointments.   Any patient concerns? No   Confirmed importance and date/time of follow-up visits scheduled:Yes.   Confirmed with patient if condition begins to worsen call PCP or go to the ER.  Patient was given the Call-a-Nurse line [phone]: Yes,

## 2016-04-03 NOTE — Addendum Note (Signed)
Addended by: Jennelle HumanNEWTON, Edd Reppert E on: 04/03/2016 08:02 AM   Modules accepted: Orders

## 2016-04-03 NOTE — Telephone Encounter (Signed)
rx sent to mail order 

## 2016-04-04 ENCOUNTER — Other Ambulatory Visit: Payer: Self-pay | Admitting: Family Medicine

## 2016-04-05 ENCOUNTER — Ambulatory Visit (INDEPENDENT_AMBULATORY_CARE_PROVIDER_SITE_OTHER): Payer: Medicare HMO | Admitting: Family Medicine

## 2016-04-05 ENCOUNTER — Encounter: Payer: Self-pay | Admitting: Family Medicine

## 2016-04-05 DIAGNOSIS — N179 Acute kidney failure, unspecified: Secondary | ICD-10-CM | POA: Insufficient documentation

## 2016-04-05 DIAGNOSIS — N10 Acute tubulo-interstitial nephritis: Secondary | ICD-10-CM | POA: Diagnosis not present

## 2016-04-05 DIAGNOSIS — E039 Hypothyroidism, unspecified: Secondary | ICD-10-CM

## 2016-04-05 MED ORDER — LINAGLIPTIN 5 MG PO TABS: 5.0000 mg | ORAL_TABLET | Freq: Every day | ORAL | 1 refills | Status: DC

## 2016-04-05 MED ORDER — GLUCOSE BLOOD VI STRP: ORAL_STRIP | 11 refills | Status: DC

## 2016-04-05 NOTE — Progress Notes (Signed)
Subjective:  Patient ID: Amy Calhoun, female    DOB: [birth year]  Age: 83 y.o. MRN: [id number]  CC: Hospital follow up  HPI:  83 year old female with extensive past medical history including DM 2, hypertension, hyperlipidemia, hypothyroidism, CKD presents for hospital follow-up.  Patient recently admitted from 3/14-3/17. Hospital course reviewed and summarized as follows: Patient presented with dysuria and flank pain. Found to have polynephritis. Treated with IV Rocephin and discharged on Keflex.  Patient also found to have an acute kidney injury during hospitalization. Improved with IV fluids. Additionally, patient's TSH was suppressed during admission.  Patient resents today for follow-up. Patient and her daughter states that she is doing well. She has completed antibiotics and has no urinary complaints at this time. Blood sugars are stable. Her hemoglobin A1c during admission was 7.4. I received notice of her suppressed TSH and have already decreased her synthroid dose to 150 mcg. She has no complaints at this time. She states that she is doing well.  Social Hx   Social History   Social History  . Marital status: Widowed    Spouse name: N/A  . Number of children: N/A  . Years of education: N/A   Occupational History  . retired    Social History Main Topics  . Smoking status: Never Smoker  . Smokeless tobacco: Never Used  . Alcohol use No  . Drug use: No  . Sexual activity: No   Other Topics Concern  . None   Social History Narrative  . None    Review of Systems  Constitutional: Negative.   Respiratory: Negative.   Genitourinary: Negative.    Objective:  BP 126/72 (BP Location: Left Arm, Cuff Size: Normal)   Pulse 82   Temp 97.5 F (36.4 C) (Oral)   Wt 158 lb 2 oz (71.7 kg)   SpO2 91%   BMI 25.52 kg/m   BP/Weight 04/05/2016 03/31/2016 03/29/2016  Systolic BP 126 133 -  Diastolic BP 72 57 -  Wt. (Lbs) 158.13 - 161.7  BMI 25.52 - 26.1   Physical Exam    Constitutional: She appears well-developed. No distress.  Cardiovascular: Normal rate and regular rhythm.   Pulmonary/Chest: Effort normal and breath sounds normal.  Abdominal: Soft. She exhibits no distension. There is no tenderness.  Neurological: She is alert.  Psychiatric: She has a normal mood and affect.  Vitals reviewed.   Lab Results  Component Value Date   WBC 14.2 (H) 03/30/2016   HGB 10.1 (L) 03/30/2016   HCT 30.4 (L) 03/30/2016   PLT 151 03/30/2016   GLUCOSE 158 (H) 03/30/2016   CHOL 114 03/29/2016   TRIG 100 03/29/2016   HDL 50 03/29/2016   LDLCALC 44 03/29/2016   ALT 16 03/01/2016   AST 22 03/01/2016   NA 139 03/30/2016   K 4.1 03/30/2016   CL 110 03/30/2016   CREATININE 1.30 (H) 03/30/2016   BUN 43 (H) 03/30/2016   CO2 24 03/30/2016   TSH 0.136 (L) 03/29/2016   HGBA1C 7.4 (H) 03/30/2016    Assessment & Plan:   Problem List Items Addressed This Visit    Hypothyroidism    Synthroid dose decreased to 150 mcg given suppressed TSH.      Acute pyelonephritis    Resolved. Has completed antibiotic course. Doing well.      Relevant Medications   cephALEXin (KEFLEX) 500 MG capsule   AKI (acute kidney injury) (HCC)    Resolved. Good PO intake.  Meds ordered this encounter  Medications  . cephALEXin (KEFLEX) 500 MG capsule    Sig: Take 500 mg by mouth 2 (two) times daily.    Refill:  0  . glucose blood (ONETOUCH VERIO) test strip    Sig: USE AS INSTRUCTED to check sugars up to four times daily. E11.9    Dispense:  200 each    Refill:  11  . linagliptin (TRADJENTA) 5 MG TABS tablet    Sig: Take 1 tablet (5 mg total) by mouth daily.    Dispense:  90 tablet    Refill:  1    Hold until pt calls for a refill on this medication.    Follow-up: Return in about 6 weeks (around 05/17/2016).  Everlene OtherJayce Kathlean Cinco DO [facility] [employer]

## 2016-04-05 NOTE — Assessment & Plan Note (Signed)
Synthroid dose decreased to 150 mcg given suppressed TSH.

## 2016-04-05 NOTE — Patient Instructions (Signed)
She is doing well.  I'll see her in 6 weeks.  Take care  Dr. Adriana Simasook

## 2016-04-05 NOTE — Assessment & Plan Note (Signed)
Resolved. Has completed antibiotic course. Doing well.

## 2016-04-05 NOTE — Assessment & Plan Note (Signed)
Resolved. Good PO intake.

## 2016-04-05 NOTE — Progress Notes (Signed)
Pre visit review using our clinic review tool, if applicable. No additional management support is needed unless otherwise documented below in the visit note. 

## 2016-04-06 ENCOUNTER — Ambulatory Visit: Payer: Medicare HMO | Admitting: Family Medicine

## 2016-04-09 DIAGNOSIS — R69 Illness, unspecified: Secondary | ICD-10-CM | POA: Diagnosis not present

## 2016-04-10 ENCOUNTER — Ambulatory Visit
Admission: RE | Admit: 2016-04-10 | Discharge: 2016-04-10 | Disposition: A | Discharge status: Home / Self Care | Payer: Medicare HMO | Source: Ambulatory Visit | Attending: Family Medicine | Admitting: Family Medicine

## 2016-04-10 DIAGNOSIS — Z780 Asymptomatic menopausal state: Secondary | ICD-10-CM | POA: Diagnosis not present

## 2016-04-10 DIAGNOSIS — Z13820 Encounter for screening for osteoporosis: Secondary | ICD-10-CM | POA: Diagnosis not present

## 2016-04-10 DIAGNOSIS — E2839 Other primary ovarian failure: Secondary | ICD-10-CM | POA: Diagnosis not present

## 2016-04-10 DIAGNOSIS — M85832 Other specified disorders of bone density and structure, left forearm: Secondary | ICD-10-CM | POA: Diagnosis not present

## 2016-04-10 DIAGNOSIS — E119 Type 2 diabetes mellitus without complications: Secondary | ICD-10-CM | POA: Insufficient documentation

## 2016-04-12 ENCOUNTER — Encounter: Payer: Self-pay | Admitting: Sports Medicine

## 2016-04-12 ENCOUNTER — Ambulatory Visit (INDEPENDENT_AMBULATORY_CARE_PROVIDER_SITE_OTHER): Payer: Medicare HMO | Admitting: Sports Medicine

## 2016-04-12 VITALS — BP 120/56 | HR 84 | Ht 66.0 in | Wt 155.0 lb

## 2016-04-12 DIAGNOSIS — E1122 Type 2 diabetes mellitus with diabetic chronic kidney disease: Secondary | ICD-10-CM | POA: Diagnosis not present

## 2016-04-12 DIAGNOSIS — G8929 Other chronic pain: Secondary | ICD-10-CM

## 2016-04-12 DIAGNOSIS — E1142 Type 2 diabetes mellitus with diabetic polyneuropathy: Secondary | ICD-10-CM

## 2016-04-12 DIAGNOSIS — M4726 Other spondylosis with radiculopathy, lumbar region: Secondary | ICD-10-CM | POA: Diagnosis not present

## 2016-04-12 DIAGNOSIS — N183 Chronic kidney disease, stage 3 unspecified: Secondary | ICD-10-CM

## 2016-04-12 DIAGNOSIS — M25552 Pain in left hip: Secondary | ICD-10-CM | POA: Diagnosis not present

## 2016-04-12 MED ORDER — ACETAMINOPHEN-CODEINE #3 300-30 MG PO TABS: 1.0000 | ORAL_TABLET | Freq: Three times a day (TID) | ORAL | 0 refills | Status: DC | PRN

## 2016-04-12 NOTE — Patient Instructions (Signed)
[facility]  Increasing her gabapentin to 3 tablets at nighttime and 2 tablets at morning time and lunchtime.  I am referring you over to Dr[facility] Naaman PlummerFred Newton at [facility] for an epidural steroid injection.  They should be in touch with you early next week regarding scheduling this.

## 2016-04-12 NOTE — Progress Notes (Signed)
OFFICE VISIT NOTE Amy Calhoun. [facility] [place] at [facility] [phone]  Amy Calhoun - 83 y.o. female MRN [id number]  Date of birth: Nov 06, 1928  Visit Date: 04/12/2016  PCP: Amy Sams, DO   Referred by: Amy Sams, DO  SUBJECTIVE:   Chief Complaint  Patient presents with  . Follow-up    Greater trochanteric bursitis of left hip. Pt had relief approx 3 days after last injection. She then took oral steriods with temporary relief and gradually C/O the same level of pain since onset. Pt is practicing exercises twice a day with no relief.   HPI: Patient here for worsening/persistent left-sided leg pain.  Unfortunately she has been in the hospital with a urinary tract infection and sepsis following her last office visit.  She did report prior to the worsening of her overall health that she had responded well to the steroid regarding her left leg pain.  Her sugars however were quite labile.  And there was some behavioral change noted by her daughter. Pain continues to bother her more so from the buttock radiate he does significantly interfere with her day-to-day activities per she denies any fevers or chills, nighttime awakenings or night sweats.  ROS:   Otherwise per HPI.  HISTORY & PERTINENT PRIOR DATA:  No specialty comments available. She reports that she has never smoked. She has never used smokeless tobacco.   Recent Labs  01/19/16 1506 03/30/16 0447  HGBA1C 8.9* 7.4*   Medications & Allergies reviewed per EMR Patient Active Problem List   Diagnosis Date Noted  . AKI (acute kidney injury) (HCC) 04/05/2016  . Acute pyelonephritis 03/31/2016  . Left hip pain 03/16/2016  . Osteoarthritis of spine with radiculopathy, lumbar region 03/16/2016  . History of colitis 03/02/2016  . Primary osteoarthritis of left hip 03/02/2016  . CKD (chronic kidney disease) stage 3, GFR 30-59 ml/min 02/21/2016  . DM type 2 with diabetic  peripheral neuropathy (HCC) 01/19/2016  . Hyperlipidemia 01/19/2016  . Hypothyroidism 01/19/2016  . Essential hypertension 01/19/2016  . Chronic pain 01/19/2016  . Esophageal dysmotility 12/21/2015   Past Medical History:  Diagnosis Date  . Depression   . Esophageal dysphagia   . Gastric ulcer   . Hyperlipidemia   . Hypertension   . Hypothyroidism   . IDDM (insulin dependent diabetes mellitus) (HCC)   . Ischemic colitis (HCC)   . Osteoarthritis   . Ulcer of esophagus    Family History  Problem Relation Age of Onset  . Diabetes Mother   . Arthritis Mother   . Hyperlipidemia Mother   . Mental illness Mother   . Heart disease Father   . Mental illness Sister   . Arthritis Maternal Grandmother   . Arthritis Maternal Grandfather    Past Surgical History:  Procedure Laterality Date  . ABDOMINAL HYSTERECTOMY    . GALLBLADDER SURGERY    . THYROIDECTOMY     Social History   Occupational History  . retired    Social History Main Topics  . Smoking status: Never Smoker  . Smokeless tobacco: Never Used  . Alcohol use No  . Drug use: No  . Sexual activity: No    OBJECTIVE:  VS:  HT:5\' 6"  (167.6 cm)   WT:155 lb (70.3 kg)  BMI:25.1    BP:(!) 120/56  HR:84bpm  TEMP: ( )  RESP:93 % PHYSICAL EXAM: General:   WDWN, NAD, Non-toxic appearing Psych:  Alert & appropriately interactive  Not depressed or anxious appearing Back & Lower Extremities:  No significant rashes/lesions/ulcerations overlying the legs or back  No significant pretibial edema.  No LE clubbing or cyanosis.  DP & PT pulses 2+/4.  LE Sensation intact to light touch in LE dermatomes.  Marked pain with straight leg raise on the left radiating to the posterior buttock and the low back.  Poor hamstring flexibility at baseline.  No significant midline tenderness.    TTP over greater sciatic notch, popliteal fossa and left sacral base.  Good internal and external rotation of the hips.  Patient  is able to stand on her toes and heels with only minimal assistance.  Balance is somewhat poor.  Otherwise Manual muscle testing is 5+/5 in BLE myotomes without focality   IMAGING & PROCEDURES: Dg Chest 2 View  Result Date: 03/28/2016 CLINICAL DATA:  83 year old female with shortness of breath. EXAM: CHEST  2 VIEW COMPARISON:  Chest radiograph dated 03/01/2016 FINDINGS: Stable mild eventration of the right hemidiaphragm. The lungs are clear. There is no pleural effusion or pneumothorax. The cardiac silhouette is within normal limits. The aorta is mildly tortuous. There is atherosclerotic calcification of the aortic arch. Old healed left posterior rib fractures. No acute fracture identified. IMPRESSION: No active cardiopulmonary disease. Electronically Signed   By: Elgie Collard M.D.   On: 03/28/2016 20:03   Dg Lumbar Spine 2-3 Views  Result Date: 03/20/2016 CLINICAL DATA:  Low back and hip pain EXAM: LUMBAR SPINE - 2-3 VIEW COMPARISON:  None in PACs FINDINGS: The lumbar vertebral bodies are preserved in height. There is mild levocurvature centered at L3-4. The pedicles and transverse processes are intact where visualized. There is high-grade disc space narrowing at L3-4 and L4-5 with milder narrowing at L5-S1. There is no spondylolisthesis. There is facet joint hypertrophy at L4-5 and at L5-S1. The observed portions of the sacrum are normal. There is calcification in the wall of the aortic arch. IMPRESSION: Moderate to severe degenerative disc disease of the mid and lower lumbar spine with moderate facet joint hypertrophy at L4-5 and L5-S1. No compression fracture or spondylolisthesis. Given the patient's bilateral hip pain, lumbar spine MRI may be a useful next imaging step. Electronically Signed   By: David  [place] M.D.   On: 03/20/2016 16:55   Dg Bone Density  Result Date: 04/10/2016 EXAM: DUAL X-RAY ABSORPTIOMETRY (DXA) FOR BONE MINERAL DENSITY IMPRESSION: Dear Dr. Adriana Simas, Your patient Amy  Marlene Calhoun completed a BMD test on 04/10/2016 using the Lunar iDXA DXA System (analysis version: 14.10) manufactured by [employer]. The following summarizes the results of our evaluation. PATIENT BIOGRAPHICAL: Name: Amy, Toren Patient ID: [id number] Birth Date: [birth year] Height: 64.0 in. Gender: Female Exam Date: 04/10/2016 Weight: 184.0 lbs. Indications: Advanced Age, Caucasian, Diabetic, Height Loss, Hysterectomy, Oophorectomy Bilateral, Postmenopausal Fractures: Treatments: gabapentin, insulin, LEVOTHYROXINE, omeprazole, simvastatin, Vitamin D ASSESSMENT: The BMD measured at Forearm Radius 33% is 0.758 g/cm2 with a T-score of -1.3. This patient is considered osteopenic according to World Health Organization [facility]) criteria. Lumbar spine was not utilized due to advanced degenerative changes. Site Region Measured Measured WHO Young Adult BMD Date       Age      Classification T-score DualFemur Neck Right 04/10/2016 85.5 Normal -0.1 1.023 g/cm2 Left Forearm Radius 33% 04/10/2016 85.5 Osteopenia -1.3 0.758 g/cm2 World Health Organization [facility]) criteria for post-menopausal, Caucasian Women: Normal:       T-score at or above -1 SD Osteopenia:   T-score between -1 and -2.5 SD  Osteoporosis: T-score at or below -2.5 SD RECOMMENDATIONS: National Osteoporosis Foundation recommends that FDA-approved medical therapies be considered in postmenopausal women and men age 17 or older with a: 1. Hip or vertebral (clinical or morphometric) fracture. 2. T-score of < -2.5 at the spine or hip. 3. Ten-year fracture probability by FRAX of 3% or greater for hip fracture or 20% or greater for major osteoporotic fracture. All treatment decisions require clinical judgment and consideration of individual patient factors, including patient preferences, co-morbidities, previous drug use, risk factors not captured in the FRAX model (e.g. falls, vitamin D deficiency, increased bone turnover, interval significant decline in bone density) and  possible under - or over-estimation of fracture risk by FRAX. All patients should ensure an adequate intake of dietary calcium (1200 mg/d) and vitamin D (800 IU daily) unless contraindicated. FOLLOW-UP: People with diagnosed cases of osteoporosis or at high risk for fracture should have regular bone mineral density tests. For patients eligible for Medicare, routine testing is allowed once every 2 years. The testing frequency can be increased to one year for patients who have rapidly progressing disease, those who are receiving or discontinuing medical therapy to restore bone mass, or have additional risk factors. I have reviewed this report, and agree with the above findings. [facility] Dear Dr. Adriana Simas, Your patient Davan DIORA BELLIZZI completed a FRAX assessment on 04/10/2016 using the [facility] iDXA DXA System (analysis version: 14.10) manufactured by [employer]. The following summarizes the results of our evaluation. PATIENT BIOGRAPHICAL: Name: Becca, Bayne Patient ID: [id number] Birth Date: 1928/02/06 Height:    64.0 in. Gender:     Female    [age]       Weight:    184.0 lbs. Ethnicity:  White                            Exam Date: 04/10/2016 FRAX* RESULTS:  (version: 3.5) 10-year Probability of Fracture1 Major Osteoporotic Fracture2 Hip Fracture 8.3% 1.4% Population: [place] (Caucasian) Risk Factors: None Based on Femur (Right) Neck BMD 1 -The 10-year probability of fracture may be lower than reported if the patient has received treatment. 2 -Major Osteoporotic Fracture: Clinical Spine, Forearm, Hip or Shoulder *FRAX is a Armed forces logistics/support/administrative officer of the [employer] of [employer] for Metabolic Bone Disease, a World Science writer (WHO) [employer]. ASSESSMENT: The probability of a major osteoporotic fracture is 8.3% within the next ten years. The probability of a hip fracture is 1.4% within the next ten years. . Electronically Signed   By: Natasha Mead M.D.   On: 04/10/2016 14:08     Ct Renal Stone Study  Result Date: 03/28/2016 CLINICAL DATA:  Acute right flank pain. EXAM: CT ABDOMEN AND PELVIS WITHOUT CONTRAST TECHNIQUE: Multidetector CT imaging of the abdomen and pelvis was performed following the standard protocol without IV contrast. COMPARISON:  None. FINDINGS: Lower chest: Mitral valve and coronary artery calcifications. Heart is normal size. No confluent opacity or effusion. Hepatobiliary: Prior cholecystectomy. Mild intrahepatic and extrahepatic biliary ductal dilatation, likely related to post cholecystectomy state. Pancreas: Fatty replacement. No focal abnormality or ductal dilatation. Spleen: No focal abnormality.  Normal size. Adrenals/Urinary Tract: Adrenal glands are unremarkable. No renal or ureteral stones. No hydronephrosis. Small amount of gas anteriorly within the urinary bladder, presumably from catheterization. Bladder is unremarkable. Stomach/Bowel: Large stool burden in the colon. Stomach, large and small bowel grossly unremarkable. Vascular/Lymphatic: Aortic and iliac  calcifications. No aneurysm. No adenopathy. Reproductive: Prior hysterectomy.  No adnexal masses. Other: No free fluid or free air. Stranding in the subcutaneous soft tissues in the right anterior abdominal wall, possibly related to subcutaneous injections. Recommend clinical correlation. Musculoskeletal: Degenerative changes in the lumbar spine. IMPRESSION: Moderate large stool burden in the colon. Prior cholecystectomy and hysterectomy. No renal or ureteral stones.  No hydronephrosis. Aorta iliac atherosclerosis. Electronically Signed   By: Charlett NoseKevin  Dover M.D.   On: 03/28/2016 20:54     ASSESSMENT & PLAN:  Visit Diagnoses:  1. Left hip pain   2. Osteoarthritis of spine with radiculopathy, lumbar region   3. CKD (chronic kidney disease) stage 3, GFR 30-59 ml/min   4. Other chronic pain   5. DM type 2 with diabetic peripheral neuropathy (HCC)    Meds:  Meds ordered this encounter   Medications  . acetaminophen-codeine (TYLENOL #3) 300-30 MG tablet    Sig: Take 1 tablet by mouth every 8 (eight) hours as needed for moderate pain.    Dispense:  30 tablet    Refill:  0    Orders:  Orders Placed This Encounter  Procedures  . Ambulatory referral to Physical Medicine Rehab    Follow-up: Return in about 6 weeks (around 05/24/2016) for repeat clinical exam.   Otherwise please see problem oriented charting as below.

## 2016-04-15 NOTE — Assessment & Plan Note (Signed)
Fairly severe lumbar spondylosis.  She is having L5 radicular symptoms that are fairly classic and consistent with neurogenic claudication.  Ultimately I believe she would benefit from a epidural steroid injection and will refer her to Dr. Alvester Morin for consideration of this.  I did discuss that MRI would be the next step given the amount of pain that she is in like to see if we can get her feeling better and use the injection for both diagnostic and therapeutic standpoint.  No other red flag symptoms are present.

## 2016-04-15 NOTE — Assessment & Plan Note (Signed)
One-time refill on Tylenol No. 3 provided for low back pain.  Red flags and BEERS criteria implications discussed with patient and her daughter.

## 2016-04-15 NOTE — Assessment & Plan Note (Signed)
Consistent with radicular pain.  Greater trochanteric injection was only minimally helpful

## 2016-04-15 NOTE — Assessment & Plan Note (Signed)
Sugars have been quite labile.  She will need close monitoring her sugars after epidural steroid injection but I would like to use localized treatments only as she had poor response to systemic steroids.

## 2016-04-19 ENCOUNTER — Ambulatory Visit: Payer: Medicare HMO | Admitting: Family Medicine

## 2016-05-01 ENCOUNTER — Other Ambulatory Visit: Payer: Self-pay | Admitting: Family Medicine

## 2016-05-01 ENCOUNTER — Telehealth: Payer: Self-pay | Admitting: *Deleted

## 2016-05-01 DIAGNOSIS — E039 Hypothyroidism, unspecified: Secondary | ICD-10-CM

## 2016-05-01 NOTE — Telephone Encounter (Signed)
Patient's daughter stated that pt was to have lab orders for thyroid. If this is correct please have provider place orders. -thanks   Pt contact [phone]

## 2016-05-02 ENCOUNTER — Telehealth: Payer: Self-pay | Admitting: Pharmacist

## 2016-05-02 NOTE — Telephone Encounter (Addendum)
S/O: Received CBG log from patients daughter.  Daughter denies signs/symptoms of hypoglycemia.  CBG report shows 1 CBG value <70 over the past 2 weeks.  Daughter does report patient has been having left hip pain (followup with orthopedics on 4/26) but other than the pain patient is doing good.       A/P: Diabetes: A1C at goal of <7.5% at 7.4%.  Higher goal chosen given patient and and history of hypoglycemic episodes.  Patient reports infrequent hypoglycemic episodes and reports adherence to medications.   -Will continue current regimen -Patient to followup with Dr Adriana Simas in June and will continue to send CBG logs to pharmacist.  Hazle Nordmann, PharmD, BCPS [facility] PGY2 Pharmacy Resident [phone]

## 2016-05-09 ENCOUNTER — Telehealth: Payer: Self-pay | Admitting: Pharmacist

## 2016-05-09 NOTE — Telephone Encounter (Signed)
05/09/16 S/O:  83 year old female referred to pharmacist for diabetes by Dr Adriana Simas.    Received call from patients daughter [name] stating that patient has been having more frequent hypoglycemia episodes over the past week.  She reports proper treatment of hypoglycemia with glucose gel but states that they sometimes over treat the low episodes.  Daughter reports patient has changed her morning breakfast to "sugar free" oatmeal instead of regular oatmeal.  Also talked to patient and she states she has been low throughout the day for the past several days.    Current Diabetes Regimen: Novolin N 30 units QAM 10 units QPM, Novolin R 6 units TID before meals, Tradjenta 5 mg daily   CBG today 167 mg/dL  CBG log received from daughter 05/09/16   A/P:  Diabetes: A1C at goal of <7.5% at 7.4%.  Higher goal chosen given patient and and history of hypoglycemic episodes. Patient/daughter report more frequent hypoglycemic episodes over the past several days possibly related to changing her morning breakfast  Following discussion and approval by Dr Adriana Simas, the following medication changes were made:  -Decrease Novolin N to 20 units every morning and 10 units every evening -Decrease Novolin R to 3 units before breakfast, 6 units before lunch and 6 units before dinner -Instructed patient to skip insulin if she is not eating and counseled on signs/symptoms/treatment of hypoglcyemia -Changes were discussed with patient and her daughter.  They demonstrated understanding of insulin changes.   Hazle Nordmann, PharmD, BCPS [facility] PGY2 Pharmacy Resident [phone]

## 2016-05-09 NOTE — Telephone Encounter (Signed)
Care was provided under my supervision. I agree with the management as indicated in the note.  Dareth Andrew DO  

## 2016-05-10 ENCOUNTER — Ambulatory Visit (INDEPENDENT_AMBULATORY_CARE_PROVIDER_SITE_OTHER): Payer: Medicare HMO | Admitting: Physical Medicine and Rehabilitation

## 2016-05-10 ENCOUNTER — Ambulatory Visit (INDEPENDENT_AMBULATORY_CARE_PROVIDER_SITE_OTHER): Payer: Medicare HMO

## 2016-05-10 VITALS — BP 145/56 | HR 81 | Temp 97.6°F

## 2016-05-10 DIAGNOSIS — M5416 Radiculopathy, lumbar region: Secondary | ICD-10-CM | POA: Diagnosis not present

## 2016-05-10 MED ORDER — METHYLPREDNISOLONE ACETATE 80 MG/ML IJ SUSP
80.0000 mg | Freq: Once | INTRAMUSCULAR | Status: AC
  Administered 2016-05-10: 80 mg

## 2016-05-10 MED ORDER — LIDOCAINE HCL (PF) 1 % IJ SOLN
2.0000 mL | Freq: Once | INTRAMUSCULAR | Status: AC
  Administered 2016-05-10: 2 mL

## 2016-05-10 MED ORDER — IIOPAMIDOL (ISOVUE-250) INJECTION 51%
3.0000 mL | Freq: Once | INTRAVENOUS | Status: AC
  Administered 2016-05-10: 3 mL
  Filled 2016-05-10: qty 50

## 2016-05-10 NOTE — Progress Notes (Deleted)
Lower back and left hip pain for around 5 months. Constant. Relief with laying flat.  Denies groin pain and pain down leg.

## 2016-05-10 NOTE — Progress Notes (Signed)
Gresia Windie Marasco - 83 y.o. female MRN [id number]  Date of birth: [birth year]  Office Visit Note: Visit Date: 05/10/2016 PCP: Tommie Sams, DO Referred by: Tommie Sams, DO  Subjective: Chief Complaint  Patient presents with  . Lower Back - Pain   HPI: Mrs. Casseus is an 83 year old female who is accompanied by her daughter who provides some of the history questions. She comes in today for planned left L5-S1 intralaminar epidural steroid injection from a diagnostic and hopefully therapeutic standpoint. She is followed by Dr. Berline Chough who obtained x-rays and showed some degenerative changes of the lumbar spine and felt like she was having radicular complaints that may benefit from epidural injection. She has not had an MRI of the lumbar spine. We discussed the injection with her and she did want to proceed with this to try to get some relief. She's had 6 months of left hip and leg pain and low back pain. It's constant and severe. She gets some pain relief with laying down flat.    ROS Otherwise per HPI.  Assessment & Plan: Visit Diagnoses:  1. Lumbar radiculopathy     Plan: Findings:  Left L5-S1 intralaminar epidural steroid injection. If no relief for her relief is very short-lived MRI of the lumbar spine may be in order.  Meds & Orders:  Meds ordered this encounter  Medications  . lidocaine (PF) (XYLOCAINE) 1 % injection 2 mL  . iopamidol (ISOVUE-250) 51 % injection 3 mL  . methylPREDNISolone acetate (DEPO-MEDROL) injection 80 mg    Orders Placed This Encounter  Procedures  . XR C-ARM NO REPORT  . Epidural Steroid injection    Follow-up: Return if symptoms worsen or fail to improve, for Dr. Berline Chough.   Procedures: No procedures performed  Lumbar Epidural Steroid Injection - Interlaminar Approach with Fluoroscopic Guidance  Patient: Kortnee Keyonni Percival      Date of Birth: 1928-02-26 MRN: [id number] PCP: Tommie Sams, DO      Visit Date: 05/10/2016   Universal Protocol:      Date/Time: 04/26/181:10 PM  Consent Given By: the patient  Position: PRONE  Additional Comments: Vital signs were monitored before and after the procedure. Patient was prepped and draped in the usual sterile fashion. The correct patient, procedure, and site was verified.   Injection Procedure Details:  Procedure Site One Meds Administered: No orders of the defined types were placed in this encounter.    Laterality: Left  Location/Site:  L5-S1  Needle size: 20 G  Needle type: Tuohy  Needle Placement: Paramedian epidural  Findings:  -Contrast Used: 1 mL iohexol 180 mg iodine/mL   -Comments: Excellent flow of contrast into the epidural space.  Procedure Details: Using a paramedian approach from the side mentioned above, the region overlying the inferior lamina was localized under fluoroscopic visualization and the soft tissues overlying this structure were infiltrated with 4 ml. of 1% Lidocaine without Epinephrine. The Tuohy needle was inserted into the epidural space using a paramedian approach.   The epidural space was localized using loss of resistance along with lateral and bi-planar fluoroscopic views.  After negative aspirate for air, blood, and CSF, a 2 ml. volume of Isovue-250 was injected into the epidural space and the flow of contrast was observed. Radiographs were obtained for documentation purposes.    The injectate was administered into the level noted above.   Additional Comments:  The patient tolerated the procedure well Dressing: Band-Aid    Post-procedure details: Patient was observed  during the procedure. Post-procedure instructions were reviewed.  Patient left the clinic in stable condition.     Clinical History: No specialty comments available.  She reports that she has never smoked. She has never used smokeless tobacco.   Recent Labs  01/19/16 1506 03/30/16 0447  HGBA1C 8.9* 7.4*    Objective:  VS:  HT:    WT:   BMI:     BP:(!)  145/56  HR:81bpm  TEMP:97.6 F (36.4 C)( )  RESP:94 % Physical Exam  Musculoskeletal:  The patient ambulates without aid with a forward flexed spine with good distal strength.    Ortho Exam Imaging: Xr C-arm No Report  Result Date: 05/10/2016 Please see Notes or Procedures tab for imaging impression.   Past Medical/Family/Surgical/Social History: Medications & Allergies reviewed per EMR Patient Active Problem List   Diagnosis Date Noted  . AKI (acute kidney injury) (HCC) 04/05/2016  . Acute pyelonephritis 03/31/2016  . Left hip pain 03/16/2016  . Osteoarthritis of spine with radiculopathy, lumbar region 03/16/2016  . History of colitis 03/02/2016  . Primary osteoarthritis of left hip 03/02/2016  . CKD (chronic kidney disease) stage 3, GFR 30-59 ml/min 02/21/2016  . DM type 2 with diabetic peripheral neuropathy (HCC) 01/19/2016  . Hyperlipidemia 01/19/2016  . Hypothyroidism 01/19/2016  . Essential hypertension 01/19/2016  . Chronic pain 01/19/2016  . Esophageal dysmotility 12/21/2015   Past Medical History:  Diagnosis Date  . Depression   . Esophageal dysphagia   . Gastric ulcer   . Hyperlipidemia   . Hypertension   . Hypothyroidism   . IDDM (insulin dependent diabetes mellitus) (HCC)   . Ischemic colitis (HCC)   . Osteoarthritis   . Ulcer of esophagus    Family History  Problem Relation Age of Onset  . Diabetes Mother   . Arthritis Mother   . Hyperlipidemia Mother   . Mental illness Mother   . Heart disease Father   . Mental illness Sister   . Arthritis Maternal Grandmother   . Arthritis Maternal Grandfather    Past Surgical History:  Procedure Laterality Date  . ABDOMINAL HYSTERECTOMY    . GALLBLADDER SURGERY    . THYROIDECTOMY     Social History   Occupational History  . retired    Social History Main Topics  . Smoking status: Never Smoker  . Smokeless tobacco: Never Used  . Alcohol use No  . Drug use: No  . Sexual activity: No

## 2016-05-10 NOTE — Patient Instructions (Signed)

## 2016-05-10 NOTE — Procedures (Signed)
Lumbar Epidural Steroid Injection - Interlaminar Approach with Fluoroscopic Guidance  Patient: Amy Calhoun      Date of Birth: 11-30-1928 MRN: [id number] PCP: Tommie Sams, DO      Visit Date: 05/10/2016   Universal Protocol:    Date/Time: 04/26/181:10 PM  Consent Given By: the patient  Position: PRONE  Additional Comments: Vital signs were monitored before and after the procedure. Patient was prepped and draped in the usual sterile fashion. The correct patient, procedure, and site was verified.   Injection Procedure Details:  Procedure Site One Meds Administered: No orders of the defined types were placed in this encounter.    Laterality: Left  Location/Site:  L5-S1  Needle size: 20 G  Needle type: Tuohy  Needle Placement: Paramedian epidural  Findings:  -Contrast Used: 1 mL iohexol 180 mg iodine/mL   -Comments: Excellent flow of contrast into the epidural space.  Procedure Details: Using a paramedian approach from the side mentioned above, the region overlying the inferior lamina was localized under fluoroscopic visualization and the soft tissues overlying this structure were infiltrated with 4 ml. of 1% Lidocaine without Epinephrine. The Tuohy needle was inserted into the epidural space using a paramedian approach.   The epidural space was localized using loss of resistance along with lateral and bi-planar fluoroscopic views.  After negative aspirate for air, blood, and CSF, a 2 ml. volume of Isovue-250 was injected into the epidural space and the flow of contrast was observed. Radiographs were obtained for documentation purposes.    The injectate was administered into the level noted above.   Additional Comments:  The patient tolerated the procedure well Dressing: Band-Aid    Post-procedure details: Patient was observed during the procedure. Post-procedure instructions were reviewed.  Patient left the clinic in stable condition.

## 2016-05-14 ENCOUNTER — Ambulatory Visit: Payer: Medicare HMO | Admitting: Family Medicine

## 2016-05-24 DIAGNOSIS — R69 Illness, unspecified: Secondary | ICD-10-CM | POA: Diagnosis not present

## 2016-05-25 ENCOUNTER — Ambulatory Visit: Payer: Medicare HMO | Admitting: Sports Medicine

## 2016-06-01 ENCOUNTER — Ambulatory Visit: Payer: Medicare HMO | Admitting: Sports Medicine

## 2016-06-04 ENCOUNTER — Encounter: Payer: Self-pay | Admitting: Sports Medicine

## 2016-06-04 ENCOUNTER — Ambulatory Visit: Payer: Medicare HMO | Admitting: Sports Medicine

## 2016-06-04 ENCOUNTER — Ambulatory Visit (INDEPENDENT_AMBULATORY_CARE_PROVIDER_SITE_OTHER): Payer: Medicare HMO | Admitting: Sports Medicine

## 2016-06-04 VITALS — BP 132/62 | HR 76 | Ht 66.0 in | Wt 156.4 lb

## 2016-06-04 DIAGNOSIS — M4726 Other spondylosis with radiculopathy, lumbar region: Secondary | ICD-10-CM | POA: Diagnosis not present

## 2016-06-04 DIAGNOSIS — G8929 Other chronic pain: Secondary | ICD-10-CM

## 2016-06-04 DIAGNOSIS — N183 Chronic kidney disease, stage 3 unspecified: Secondary | ICD-10-CM

## 2016-06-04 DIAGNOSIS — M25552 Pain in left hip: Secondary | ICD-10-CM

## 2016-06-04 MED ORDER — ACETAMINOPHEN-CODEINE #3 300-30 MG PO TABS: 1.0000 | ORAL_TABLET | Freq: Three times a day (TID) | ORAL | 0 refills | Status: DC | PRN

## 2016-06-04 NOTE — Assessment & Plan Note (Signed)
Tylenol 3 provided today to hopefully be able to wean off the anti-inflammatories.  She has continued the ibuprofen and this will need to be monitored closely recommend avoiding if possible.

## 2016-06-04 NOTE — Progress Notes (Signed)
OFFICE VISIT NOTE Amy Calhoun. Amy Shinerigby, DO  [facility] [place] [phone]  Amy Calhoun Amy Calhoun - 83 y.o. female MRN [id number]  Date of birth: 05/07/1928  Visit Date: 06/04/2016  PCP: Amy Calhoun, Amy G, DO   Referred by: Amy Calhoun, Amy G, DO  Amy DakinBrandy Calhoun, CMA acting as scribe for Amy Calhoun.  SUBJECTIVE:   Chief Complaint  Patient presents with  . Hip Pain   HPI: As below and per problem based documentation when appropriate.  Pt presents today in follow-up of left hip pain with radiculopathy This has been an ongoing issues.  Fairly severe lumbar spondylosis  The pain is described as constant shooting pain and is rated as 8/10 currently. She reports on almost 100% improvement following epidural steroid injection only lasted 3 days.  Anesthetic phase was significantly beneficial.  Worsened with bending at the hip Improves with rest Therapies tried include Gabapentin, Tylenol #3, steroid injection (no relief and elevated BG), pt was referred for epidural injection (helped for a couple of weeks but then pain returned), MRI was also discussed at last OV. She has also tried using Lidocaine patch with some relief. Pt. Is also taking 800 mg of Motrin with some relief.   Other associated symptoms include: pt denies pain in knees, ankles, feet.   Pt denies chills, fever, night sweats    Review of Systems  Constitutional: Negative.   HENT: Positive for tinnitus ("motor" in ears at night).   Eyes: Negative.   Respiratory: Negative.   Cardiovascular: Negative.   Gastrointestinal: Negative.   Genitourinary: Negative.   Musculoskeletal: Positive for back pain, joint pain and myalgias. Negative for falls.  Skin: Negative.   Neurological: Positive for dizziness. Negative for headaches.  Endo/Heme/Allergies: Negative.   Psychiatric/Behavioral: The patient has insomnia (trouble falling asleep).     Otherwise per HPI.  HISTORY & PERTINENT PRIOR DATA:    No specialty comments available. She reports that she has never smoked. She has never used smokeless tobacco.   Recent Labs  01/19/16 1506 03/30/16 0447  HGBA1C 8.9* 7.4*   Medications & Allergies reviewed per EMR Patient Active Problem List   Diagnosis Date Noted  . AKI (acute kidney injury) (HCC) 04/05/2016  . Acute pyelonephritis 03/31/2016  . Left hip pain 03/16/2016  . Osteoarthritis of spine with radiculopathy, lumbar region 03/16/2016  . History of colitis 03/02/2016  . Primary osteoarthritis of left hip 03/02/2016  . CKD (chronic kidney disease) stage 3, GFR 30-59 ml/min 02/21/2016  . DM type 2 with diabetic peripheral neuropathy (HCC) 01/19/2016  . Hyperlipidemia 01/19/2016  . Hypothyroidism 01/19/2016  . Essential hypertension 01/19/2016  . Chronic pain 01/19/2016  . Esophageal dysmotility 12/21/2015   Past Medical History:  Diagnosis Date  . Depression   . Esophageal dysphagia   . Gastric ulcer   . Hyperlipidemia   . Hypertension   . Hypothyroidism   . IDDM (insulin dependent diabetes mellitus) (HCC)   . Ischemic colitis (HCC)   . Osteoarthritis   . Ulcer of esophagus    Family History  Problem Relation Age of Onset  . Diabetes Mother   . Arthritis Mother   . Hyperlipidemia Mother   . Mental illness Mother   . Heart disease Father   . Mental illness Sister   . Arthritis Maternal Grandmother   . Arthritis Maternal Grandfather    Past Surgical History:  Procedure Laterality Date  . ABDOMINAL HYSTERECTOMY    . GALLBLADDER SURGERY    .  THYROIDECTOMY     Social History   Occupational History  . retired    Social History Main Topics  . Smoking status: Never Smoker  . Smokeless tobacco: Never Used  . Alcohol use No  . Drug use: No  . Sexual activity: No    OBJECTIVE:  VS:  HT:5\' 6"  (167.6 cm)   WT:156 lb 6.4 oz (70.9 kg)  BMI:25.3    BP:132/62  HR:76bpm  TEMP: ( )  RESP:  EXAM: Findings:  WDWN, NAD, Non-toxic appearing Alert &  appropriately interactive Not depressed or anxious appearing No increased work of breathing. Pupils are equal. EOM intact without nystagmus No clubbing or cyanosis of the extremities appreciated No significant rashes/lesions/ulcerations overlying the examined area. DP & PT pulses 2+/4.  No significant pretibial edema.  No clubbing or cyanosis Sensation intact to light touch in lower extremities.  Back and hips: Overall good internal and external range of motion of bilateral hips.  She has pain with straight leg raise localizing to the left buttock.  Pain with popliteal compression test as well as palpation of the greater sciatic notch.  She has marked pain is most of the liver the glute medius muscle belly.  Only mild pain over the greater trochanter.  Lower extremity sensation is intact to light touch.     Xr C-arm No Report  Result Date: 05/10/2016 Please see Notes or Procedures tab for imaging impression.  ASSESSMENT & PLAN:   Problem List Items Addressed This Visit    Chronic pain    Patient has intermittently been on Tylenol 3.  I have refilled this today for purposes of her back only.  We did check at the Amy Calhoun is negative for additional feels other than for myself and Dr. Adriana Calhoun as well as 1 ER physician.  If she is going to need continued ongoing chronic opioid therapy referral to pain management will need to be discussed.      Relevant Medications   ibuprofen (ADVIL,MOTRIN) 800 MG tablet   acetaminophen-codeine (TYLENOL #3) 300-30 MG tablet   CKD (chronic kidney disease) stage 3, GFR 30-59 ml/min    Tylenol 3 provided today to hopefully be able to wean off the anti-inflammatories.  She has continued the ibuprofen and this will need to be monitored closely recommend avoiding if possible.      Left hip pain - Primary   Relevant Medications   acetaminophen-codeine (TYLENOL #3) 300-30 MG tablet   Other Relevant Orders   MR Lumbar Spine Wo Contrast   Osteoarthritis of spine with  radiculopathy, lumbar region    Symptoms are consistent still with a left-sided radiculitis.  Given the severity of her symptoms as well as persistent pain following epidural steroid injection that did result in 3 days of 100% relief from the anesthetic phase MRI of the lumbar spine indicated at this time.  Order placed and will plan to follow-up with her after this is obtained.  May need referral to neurosurgery versus consideration of physical therapy vs further titration of gabapentin.      Relevant Medications   ibuprofen (ADVIL,MOTRIN) 800 MG tablet   acetaminophen-codeine (TYLENOL #3) 300-30 MG tablet   Other Relevant Orders   MR Lumbar Spine Wo Contrast      Follow-up: Return for MRI Review.   CMA/ATC served as Neurosurgeon during this visit. History, Physical, and Plan performed by medical provider. Documentation and orders reviewed and attested to.      Gaspar Bidding, DO    [facility]  Sports Medicine Physician

## 2016-06-04 NOTE — Assessment & Plan Note (Signed)
Symptoms are consistent still with a left-sided radiculitis.  Given the severity of her symptoms as well as persistent pain following epidural steroid injection that did result in 3 days of 100% relief from the anesthetic phase MRI of the lumbar spine indicated at this time.  Order placed and will plan to follow-up with her after this is obtained.  May need referral to neurosurgery versus consideration of physical therapy vs further titration of gabapentin.

## 2016-06-04 NOTE — Patient Instructions (Signed)

## 2016-06-04 NOTE — Assessment & Plan Note (Signed)
Patient has intermittently been on Tylenol 3.  I have refilled this today for purposes of her back only.  We did check at the Earlene PlaterDavis is negative for additional feels other than for myself and Dr. Adriana Simasook as well as 1 ER physician.  If she is going to need continued ongoing chronic opioid therapy referral to pain management will need to be discussed.

## 2016-06-12 ENCOUNTER — Other Ambulatory Visit: Payer: Self-pay | Admitting: Family Medicine

## 2016-06-12 DIAGNOSIS — E1142 Type 2 diabetes mellitus with diabetic polyneuropathy: Secondary | ICD-10-CM

## 2016-06-12 MED ORDER — INSULIN NPH (HUMAN) (ISOPHANE) 100 UNIT/ML ~~LOC~~ SUSP: SUBCUTANEOUS | 1 refills | Status: DC

## 2016-06-12 NOTE — Telephone Encounter (Signed)
Pt daughter called about pt needing a refill for pt insulin. insulin NPH Human (HUMULIN N,NOVOLIN N) 100 UNIT/ML injection daughter would like to get 90 or 180 supply.   Pt is on the last vial.   [place] [facility] [place] [address]  Call pt daughter @ [phone]. Thank you!

## 2016-06-12 NOTE — Telephone Encounter (Addendum)
-  Refill request new directions : Decrease Novolin R to 3 units before breakfast, 6 units before lunch and 6 units before dinner-- Decrease Novolin N to 20 unites every morning and 10 units every evening  Saw Kelsy on 05/09/16, please advise.  90 day supply ? Ok to fill

## 2016-06-13 ENCOUNTER — Telehealth: Payer: Self-pay | Admitting: Pharmacist

## 2016-06-13 MED ORDER — INSULIN REGULAR HUMAN 100 UNIT/ML IJ SOLN: INTRAMUSCULAR | 0 refills | Status: DC

## 2016-06-13 NOTE — Addendum Note (Signed)
Addended by: Alisia FerrariBARE, KRISTEN C on: 06/13/2016 02:51 PM   Modules accepted: Orders

## 2016-06-13 NOTE — Telephone Encounter (Signed)
06/13/16  Received CBG log from patients daughter.  Daughter denies hypoglycemic episodes.  Daughter states patient is doing okay but has been having some hip pain.  She has been seen by Dr Berline Choughigby and has an MRI scheduled for 06/20/16.     Current Diabetes Regimen Novolin N 20 units QAM and 10 units QPM Novolin R 3 units before breakfast and 6 units before lunch and dinner.  CBGs:   Lab Results  Component Value Date   HGBA1C 7.4 (H) 03/30/2016   A/P: Diabetes with A1C at goal of <7.5 at 7.4%.  Higher goal chosen given patient and and history of hypoglycemic episodes and difficulty to maintain stringent control given patient age and patient preference to utilize NPH and Regular insulin.  Current CBG log does have some values in the 70s likely due to patient not eating and in which patient correctly did not administer Novolin R insulin.   Following discussion and approval by Dr Adriana Simasook, the following medication changes were made:  Novolin N 20 units QAM and 10 units QPM Novolin R 3 units before breakfast and 6 units before lunch and dinner. Patient will followup with Dr Adriana Simasook in June.   Hazle NordmannKelsy Combs, PharmD, BCPS [facility] PGY2 Pharmacy Resident [phone]

## 2016-06-13 NOTE — Addendum Note (Signed)
Addended by: Alisia FerrariBARE, KRISTEN C on: 06/13/2016 02:38 PM   Modules accepted: Orders

## 2016-06-13 NOTE — Addendum Note (Signed)
Addended by: Alisia FerrariBARE, KRISTEN C on: 06/13/2016 02:14 PM   Modules accepted: Orders

## 2016-06-13 NOTE — Telephone Encounter (Signed)
Pt daughter called to follow up on the refill. Please advise?  Call daughter @ [phone]. Thank you!

## 2016-06-13 NOTE — Telephone Encounter (Signed)
Printed script for you to sign . In your folder.

## 2016-06-14 NOTE — Telephone Encounter (Signed)
Script faxed.

## 2016-06-20 ENCOUNTER — Ambulatory Visit
Admission: RE | Admit: 2016-06-20 | Discharge: 2016-06-20 | Disposition: A | Discharge status: Home / Self Care | Payer: Medicare HMO | Source: Ambulatory Visit | Attending: Sports Medicine | Admitting: Sports Medicine

## 2016-06-20 DIAGNOSIS — M4802 Spinal stenosis, cervical region: Secondary | ICD-10-CM | POA: Diagnosis not present

## 2016-06-20 DIAGNOSIS — M25552 Pain in left hip: Secondary | ICD-10-CM

## 2016-06-20 DIAGNOSIS — M4726 Other spondylosis with radiculopathy, lumbar region: Secondary | ICD-10-CM

## 2016-06-24 ENCOUNTER — Telehealth: Payer: Self-pay | Admitting: Family Medicine

## 2016-06-25 NOTE — Telephone Encounter (Signed)
Last office visit 01/19/16 chronic  Next office 07/06/16 Last filled 03/06/16

## 2016-06-25 NOTE — Telephone Encounter (Signed)
This will be deferred to Dr Adriana Simasook once he returns.

## 2016-06-26 ENCOUNTER — Telehealth: Payer: Self-pay | Admitting: Family Medicine

## 2016-06-26 DIAGNOSIS — M4726 Other spondylosis with radiculopathy, lumbar region: Secondary | ICD-10-CM

## 2016-06-26 NOTE — Telephone Encounter (Signed)
See annotations below.  

## 2016-06-26 NOTE — Telephone Encounter (Signed)
Patient's daughter "[name]" calling to request to speak to Dr. Berline Choughigby to review MRI results.    **Remind patient they can make refill requests via MyChart**  Medication refill request (Name & Dosage):  lidocaine (LIDODERM) 5 %  Preferred pharmacy (Name & Address):   [facility] [place] [address]  Other comments (if applicable):

## 2016-06-27 NOTE — Telephone Encounter (Signed)
Spoke with daughter [name]. She is agreeable for the referral to Dr. Alvester MorinNewton. Order has been placed.

## 2016-06-27 NOTE — Telephone Encounter (Signed)
Patient does have significant diffuse changes on her MRI.  I am happy to go over these results in person with them or we can refer them to neurosurgery for consultation.  An alternative please call and ask them what they would like to do of the 3 options above, would be to consider referral back to Dr. Alvester MorinNewton for a epidural steroid injection at the L3 level this is may provide significant more benefit since we will be able to localize the area that is most affected.  Please call and find out what they would like to do: Be referred to neurosurgery, go back to see Dr. Alvester MorinNewton, or come in to discuss this in person

## 2016-06-29 NOTE — Telephone Encounter (Signed)
Notified patient DPR that script was ent per EPIC on 06/26/16 to [facility].

## 2016-06-29 NOTE — Telephone Encounter (Signed)
Patients daughter requested to know if this sent to pharmacy Please Contact [phone] [facility]

## 2016-07-06 ENCOUNTER — Encounter: Payer: Self-pay | Admitting: Sports Medicine

## 2016-07-06 ENCOUNTER — Other Ambulatory Visit: Payer: Self-pay

## 2016-07-06 ENCOUNTER — Ambulatory Visit (INDEPENDENT_AMBULATORY_CARE_PROVIDER_SITE_OTHER): Payer: Medicare HMO | Admitting: Family Medicine

## 2016-07-06 ENCOUNTER — Ambulatory Visit: Payer: Medicare HMO | Admitting: Family Medicine

## 2016-07-06 ENCOUNTER — Ambulatory Visit: Payer: Medicare HMO | Admitting: Sports Medicine

## 2016-07-06 ENCOUNTER — Encounter: Payer: Self-pay | Admitting: Family Medicine

## 2016-07-06 ENCOUNTER — Ambulatory Visit (INDEPENDENT_AMBULATORY_CARE_PROVIDER_SITE_OTHER): Payer: Medicare HMO | Admitting: Sports Medicine

## 2016-07-06 VITALS — BP 160/80 | HR 61 | Ht 66.0 in | Wt 166.6 lb

## 2016-07-06 VITALS — BP 140/78 | HR 81 | Temp 98.6°F | Resp 12 | Ht 66.0 in | Wt 166.2 lb

## 2016-07-06 DIAGNOSIS — M4726 Other spondylosis with radiculopathy, lumbar region: Secondary | ICD-10-CM

## 2016-07-06 DIAGNOSIS — E1142 Type 2 diabetes mellitus with diabetic polyneuropathy: Secondary | ICD-10-CM | POA: Diagnosis not present

## 2016-07-06 DIAGNOSIS — M25552 Pain in left hip: Secondary | ICD-10-CM

## 2016-07-06 DIAGNOSIS — E785 Hyperlipidemia, unspecified: Secondary | ICD-10-CM | POA: Diagnosis not present

## 2016-07-06 DIAGNOSIS — D649 Anemia, unspecified: Secondary | ICD-10-CM

## 2016-07-06 DIAGNOSIS — E039 Hypothyroidism, unspecified: Secondary | ICD-10-CM

## 2016-07-06 DIAGNOSIS — I10 Essential (primary) hypertension: Secondary | ICD-10-CM | POA: Diagnosis not present

## 2016-07-06 LAB — CBC
HCT: 33.9 % — ABNORMAL LOW (ref 36.0–46.0)
HEMOGLOBIN: 11.2 g/dL — AB (ref 12.0–15.0)
MCHC: 33.1 g/dL (ref 30.0–36.0)
MCV: 89.8 fl (ref 78.0–100.0)
PLATELETS: 249 10*3/uL (ref 150.0–400.0)
RBC: 3.78 Mil/uL — ABNORMAL LOW (ref 3.87–5.11)
RDW: 13.6 % (ref 11.5–15.5)
WBC: 7 10*3/uL (ref 4.0–10.5)

## 2016-07-06 LAB — COMPREHENSIVE METABOLIC PANEL
ALBUMIN: 3.9 g/dL (ref 3.5–5.2)
ALT: 12 U/L (ref 0–35)
AST: 14 U/L (ref 0–37)
Alkaline Phosphatase: 69 U/L (ref 39–117)
BUN: 26 mg/dL — ABNORMAL HIGH (ref 6–23)
CALCIUM: 8.8 mg/dL (ref 8.4–10.5)
CO2: 28 mEq/L (ref 19–32)
CREATININE: 1.29 mg/dL — AB (ref 0.40–1.20)
Chloride: 100 mEq/L (ref 96–112)
GFR: 41.67 mL/min — AB (ref >60.00)
Glucose, Bld: 364 mg/dL — ABNORMAL HIGH (ref 70–99)
Potassium: 5.2 mEq/L — ABNORMAL HIGH (ref 3.5–5.1)
Sodium: 136 mEq/L (ref 135–145)
Total Bilirubin: 0.3 mg/dL (ref 0.2–1.2)
Total Protein: 6.7 g/dL (ref 6.0–8.3)

## 2016-07-06 LAB — HEMOGLOBIN A1C: Hgb A1c MFr Bld: 6.9 % — ABNORMAL HIGH (ref 4.6–6.5)

## 2016-07-06 LAB — TSH: TSH: 1.22 u[IU]/mL (ref 0.35–4.50)

## 2016-07-06 MED ORDER — ACETAMINOPHEN-CODEINE #3 300-30 MG PO TABS: 1.0000 | ORAL_TABLET | Freq: Three times a day (TID) | ORAL | 0 refills | Status: DC | PRN

## 2016-07-06 NOTE — Assessment & Plan Note (Signed)
At goal. Continue lisinopril. Labs today.

## 2016-07-06 NOTE — Assessment & Plan Note (Signed)
Patient still having significant pain. Has appointment today with Dr. Berline Choughigby.

## 2016-07-06 NOTE — Assessment & Plan Note (Signed)
A goal on simvastatin. Continue.

## 2016-07-06 NOTE — Assessment & Plan Note (Signed)
TSH today 

## 2016-07-06 NOTE — Assessment & Plan Note (Signed)
Repeat CBC today 

## 2016-07-06 NOTE — Patient Instructions (Signed)
Continue her medications.  Follow up in 3 months.  Take care  Dr. Adriana Simasook

## 2016-07-06 NOTE — Progress Notes (Signed)
OFFICE VISIT NOTE Amy Calhoun. Amy Calh[facility] at [facility] [phone]  Amy Calhoun - 83 y.o. female MRN [id number]  Date of birth: Dec 22, 1928  Visit Date: 07/06/2016  PCP: Amy Sams, DO   Referred by: Amy Sams, DO   SUBJECTIVE:   Chief Complaint  Patient presents with  . Follow-up  . Review MRI   HPI: As below and per problem based documentation when appropriate.  Patient continues to have significant back pain.  She is here today to review the MRI of the lumbar spine as previously obtained.  She has failed conservative measures putting epidural steroid injections with only minimal relief.    Review of Systems  Constitutional: Negative for chills, diaphoresis, fever, malaise/fatigue and weight loss.  HENT: Negative.   Eyes: Negative.   Respiratory: Negative.   Cardiovascular: Negative.   Gastrointestinal: Negative.   Genitourinary: Negative for dysuria, frequency and urgency.  Musculoskeletal: Positive for joint pain and myalgias.  Skin: Negative.   Neurological: Negative.  Negative for dizziness, tingling, weakness and headaches.  Endo/Heme/Allergies: Negative for environmental allergies and polydipsia. Does not bruise/bleed easily.  Psychiatric/Behavioral: Negative.     Otherwise per HPI.  HISTORY & PERTINENT PRIOR DATA:  No specialty comments available. She reports that she has never smoked. She has never used smokeless tobacco.   Recent Labs  01/19/16 1506 03/30/16 0447 07/06/16 0924  HGBA1C 8.9* 7.4* 6.9*   Medications & Allergies reviewed per EMR Patient Active Problem List   Diagnosis Date Noted  . Acute respiratory failure with hypoxemia (HCC) 07/12/2016  . Community acquired pneumonia 07/12/2016  . Anemia 07/06/2016  . Osteoarthritis of spine with radiculopathy, lumbar region 03/16/2016  . History of colitis 03/02/2016  . Primary osteoarthritis of left hip 03/02/2016  . CKD (chronic kidney  disease) stage 3, GFR 30-59 ml/min 02/21/2016  . DM type 2 with diabetic peripheral neuropathy (HCC) 01/19/2016  . Hyperlipidemia 01/19/2016  . Hypothyroidism 01/19/2016  . Essential hypertension 01/19/2016  . Chronic pain 01/19/2016  . Esophageal dysmotility 12/21/2015   Past Medical History:  Diagnosis Date  . Depression   . Esophageal dysphagia   . Gastric ulcer   . Hyperlipidemia   . Hypertension   . Hypothyroidism   . IDDM (insulin dependent diabetes mellitus) (HCC)   . Ischemic colitis (HCC)   . Osteoarthritis   . Ulcer of esophagus    Family History  Problem Relation Age of Onset  . Diabetes Mother   . Arthritis Mother   . Hyperlipidemia Mother   . Mental illness Mother   . Heart disease Father   . Mental illness Sister   . Arthritis Maternal Grandmother   . Arthritis Maternal Grandfather   . Colon cancer Neg Hx   . Stomach cancer Neg Hx   . Esophageal cancer Neg Hx    Past Surgical History:  Procedure Laterality Date  . ABDOMINAL HYSTERECTOMY    . GALLBLADDER SURGERY    . THYROIDECTOMY     Social History   Occupational History  . retired    Social History Main Topics  . Smoking status: Never Smoker  . Smokeless tobacco: Never Used  . Alcohol use No  . Drug use: No  . Sexual activity: No    OBJECTIVE:  VS:  HT:5\' 6"  (167.6 cm)   WT:166 lb 9.6 oz (75.6 kg)  BMI:26.9    BP:(!) 160/80  HR:61bpm  TEMP: ( )  RESP:96 % EXAM: Findings:  Elderly female.  In no acute respiratory distress looks slightly uncomfortable just sitting here.  She has marked difficulty with going from sitting to standing.  Marked pain localizing to the low back with straight leg raise bilaterally left worse than right.  Sensation is diminished to light touch throughout she has a generalized dysesthesia in a nondermatomal distribution.  Lower extremity reflexes are diminished.     MRI Lumbar spine from 06/21/16 reviewed: Significant multilevel degenerative changes  ASSESSMENT &  PLAN:   Problem List Items Addressed This Visit      Nervous and Auditory   Osteoarthritis of spine with radiculopathy, lumbar region - Primary    Given her ability to ambulate significant pain since she has an for neurosurgical evaluation warranted.>50% of this 25 minute visit spent in direct patient counseling and/or coordination of care.  Discussion was focused on education regarding the in discussing the pathoetiology and anticipated clinical course of the above condition.  Focus of discussion was around treatment options including continued conservative measures and operative intervention.  Ultimately they would like for surgical opinion.           Follow-up: No Follow-up on file.    CMA/ATC served as Neurosurgeonscribe during this visit. History, Physical, and Plan performed by medical provider. Documentation and orders reviewed and attested to.      Amy BiddingMichael Rigby, DO    Amy Calhoun [facility] Physician

## 2016-07-06 NOTE — Progress Notes (Signed)
Subjective:  Patient ID: Amy Calhoun, female    DOB: 12/08/1928  Age: 83 y.o. MRN: [id number]  CC: Follow up  HPI:  83 year old female with an extensive past medical history including chronic pain, hypertension, DM 2, hypothyroidism, chronic kidney disease stage III, hyperlipidemia presents for follow-up.  DM-2  Patient has been working closely with our pharmacist and her diabetes is quite well controlled for her age.  Last A1c 7.4.  Needs A1c today.  She has had a few low blood sugars in the 70s. Her sugars are quite labile.  She is currently on NPH 20 in the am and 10 in the pm, Regular insulin 3/6/6, and Tradjenta.  Hypertension  Given her advanced age, her blood pressure is at goal.  She is currently on lisinopril and tolerating.  Needs labs today.  HLD  At goal on Simvastatin.  Social Hx   Social History   Social History  . Marital status: Widowed    Spouse name: N/A  . Number of children: N/A  . Years of education: N/A   Occupational History  . retired    Social History Main Topics  . Smoking status: Never Smoker  . Smokeless tobacco: Never Used  . Alcohol use No  . Drug use: No  . Sexual activity: No   Other Topics Concern  . None   Social History Narrative  . None    Review of Systems  Respiratory: Negative.   Cardiovascular: Negative.   Musculoskeletal:       Back/hip pain.   Objective:  BP 140/78 (BP Location: Left Arm, Patient Position: Sitting, Cuff Size: Normal)   Pulse 81   Temp 98.6 F (37 C) (Oral)   Resp 12   Ht 5\' 6"  (1.676 m)   Wt 166 lb 3.2 oz (75.4 kg)   SpO2 92%   BMI 26.83 kg/m   BP/Weight 07/06/2016 07/06/2016 06/04/2016  Systolic BP 140 160 132  Diastolic BP 78 80 62  Wt. (Lbs) 166.2 166.6 156.4  BMI 26.83 26.89 25.24    Physical Exam  Constitutional: She appears well-developed. No distress.  Cardiovascular: Normal rate and regular rhythm.   Murmur heard. Pulmonary/Chest: Effort normal and breath  sounds normal. She has no wheezes. She has no rales.  Neurological: She is alert.  Psychiatric: She has a normal mood and affect.  Vitals reviewed.   Lab Results  Component Value Date   WBC 14.2 (H) 03/30/2016   HGB 10.1 (L) 03/30/2016   HCT 30.4 (L) 03/30/2016   PLT 151 03/30/2016   GLUCOSE 158 (H) 03/30/2016   CHOL 114 03/29/2016   TRIG 100 03/29/2016   HDL 50 03/29/2016   LDLCALC 44 03/29/2016   ALT 16 03/01/2016   AST 22 03/01/2016   NA 139 03/30/2016   K 4.1 03/30/2016   CL 110 03/30/2016   CREATININE 1.30 (H) 03/30/2016   BUN 43 (H) 03/30/2016   CO2 24 03/30/2016   TSH 0.136 (L) 03/29/2016   HGBA1C 7.4 (H) 03/30/2016    Assessment & Plan:   Problem List Items Addressed This Visit      Cardiovascular and Mediastinum   Essential hypertension    At goal. Continue lisinopril. Labs today.      Relevant Orders   Comprehensive metabolic panel     Endocrine   DM type 2 with diabetic peripheral neuropathy (HCC) - Primary    At goal given advanced age. Continue current insulin regimen and Tradjenta. Labs including A1C today.  Relevant Orders   Hemoglobin A1c   Comprehensive metabolic panel   Hypothyroidism    TSH today.      Relevant Orders   TSH     Nervous and Auditory   Osteoarthritis of spine with radiculopathy, lumbar region    Patient still having significant pain. Has appointment today with Dr. Berline Chough.         Other   Anemia    Repeat CBC today.      Relevant Orders   CBC   Hyperlipidemia    A goal on simvastatin. Continue.        Follow-up: Return in about 3 months (around 10/06/2016).  Everlene Other DO [facility]

## 2016-07-06 NOTE — Assessment & Plan Note (Signed)
At goal given advanced age. Continue current insulin regimen and Tradjenta. Labs including A1C today.

## 2016-07-06 NOTE — Progress Notes (Signed)
Pre-visit discussion using our clinic review tool. No additional management support is needed unless otherwise documented below in the visit note.  

## 2016-07-08 DIAGNOSIS — R69 Illness, unspecified: Secondary | ICD-10-CM | POA: Diagnosis not present

## 2016-07-10 ENCOUNTER — Ambulatory Visit (INDEPENDENT_AMBULATORY_CARE_PROVIDER_SITE_OTHER): Payer: Medicare HMO

## 2016-07-10 ENCOUNTER — Ambulatory Visit (INDEPENDENT_AMBULATORY_CARE_PROVIDER_SITE_OTHER): Payer: Medicare HMO | Admitting: Physical Medicine and Rehabilitation

## 2016-07-10 ENCOUNTER — Encounter (INDEPENDENT_AMBULATORY_CARE_PROVIDER_SITE_OTHER): Payer: Self-pay | Admitting: Physical Medicine and Rehabilitation

## 2016-07-10 VITALS — BP 149/60 | HR 73 | Temp 97.8°F

## 2016-07-10 DIAGNOSIS — M48061 Spinal stenosis, lumbar region without neurogenic claudication: Secondary | ICD-10-CM

## 2016-07-10 MED ORDER — METHYLPREDNISOLONE ACETATE 80 MG/ML IJ SUSP
80.0000 mg | Freq: Once | INTRAMUSCULAR | Status: AC
  Administered 2016-07-10: 80 mg

## 2016-07-10 MED ORDER — LIDOCAINE HCL (PF) 1 % IJ SOLN
2.0000 mL | Freq: Once | INTRAMUSCULAR | Status: AC
  Administered 2016-07-10: 2 mL

## 2016-07-10 NOTE — Procedures (Signed)
Lumbosacral Transforaminal Epidural Steroid Injection - Infraneural Approach with Fluoroscopic Guidance  Patient: Amy Calhoun Amy Calhoun      Date of Birth: 08/27/1928 MRN: [id number] PCP: Tommie Samsook, Jayce G, DO      Visit Date: 07/10/2016   Universal Protocol:     Consent Given By: the patient  Position: PRONE   Additional Comments: Vital signs were monitored before and after the procedure. Patient was prepped and draped in the usual sterile fashion. The correct patient, procedure, and site was verified.   Injection Procedure Details:  Procedure Site One Meds Administered:  Meds ordered this encounter  Medications  . lidocaine (PF) (XYLOCAINE) 1 % injection 2 mL  . methylPREDNISolone acetate (DEPO-MEDROL) injection 80 mg      Laterality: Left  Location/Site:  L4-L5  Needle size: 22 G  Needle type: Spinal  Needle Placement: Transforaminal  Findings:  -Contrast Used: 1 mL iohexol 180 mg iodine/mL   -Comments: Excellent flow of contrast along the nerve and into the epidural space.  Procedure Details: After squaring off the end-plates of the desired vertebral level to get a true AP view, the C-arm was obliqued to the painful side so that the superior articulating process is positioned about 1/3 the length of the inferior endplate.  The needle was aimed toward the junction of the superior articular process and the transverse process of the inferior vertebrae. The needle's initial entry is in the lower third of the foramen through Kambin's triangle. The soft tissues overlying this target were infiltrated with 2-3 ml. of 1% Lidocaine without Epinephrine.  The spinal needle was then inserted and advanced toward the target using a "trajectory" view along the fluoroscope beam.  Under AP and lateral visualization, the needle was advanced so it did not puncture dura and did not traverse medially beyond the 6 o'clock position of the pedicle. Bi-planar projections were used to confirm position.  Aspiration was confirmed to be negative for CSF and/or blood. A 1-2 ml. volume of Isovue-250 was injected and flow of contrast was noted at each level. Radiographs were obtained for documentation purposes.   After attaining the desired flow of contrast documented above, a 0.5 to 1.0 ml test dose of 0.25% Marcaine was injected into each respective transforaminal space.  The patient was observed for 90 seconds post injection.  After no sensory deficits were reported, and normal lower extremity motor function was noted,   the above injectate was administered so that equal amounts of the injectate were placed at each foramen (level) into the transforaminal epidural space.   Additional Comments:  The patient tolerated the procedure well Dressing: Band-Aid    Post-procedure details: Patient was observed during the procedure. Post-procedure instructions were reviewed.  Patient left the clinic in stable condition.

## 2016-07-10 NOTE — Progress Notes (Signed)
Amy Calhoun - 83 y.o. female MRN [id number]  Date of birth: [birth year]  Office Visit Note: Visit Date: 07/10/2016 PCP: Tommie Sams, DO Referred by: Tommie Sams, DO  Subjective: Chief Complaint  Patient presents with  . Lower Back - Pain   HPI: Amy Calhoun is an 83 year old female who is accompanied by Amy Calhoun. They're followed by Dr. Berline Chough who request L4 transforaminal epidural steroid injection. We saw Amy Calhoun a completed an intralaminar injection for radicular-type left-sided leg pain and hip pain. Amy pain is mostly in the left low back and hip. Interestingly she says sitting makes the pain worse but it is also worse standing and walking. She did do well with intralaminar injection which was done prior to MRI. This that helped Amy quite a bit but only for a couple weeks. MRI was obtained by Dr. Berline Chough and is reviewed below. She has multilevel stenosis at L2-S3 and L3-4 and L4-5. She has fairly significant stenosis at L4-5 centrally in the lateral recesses. I do agree that an L4 transforaminal injection would hopefully be diagnostic and therapeutic. The patient's Calhoun would like the patient to have an injection at every level and we did discuss this at length. She seemed to be a little irritated that we really only wanted to do the one level but I tried to explain to Amy from a safety perspective and getting most of the medication at the S1 level of medication does spread to a degree. Obviously if she doesn't get relief with long-standing I would try one more transforaminal approach may be above this level and is pretty standard treatment.    ROS Otherwise per HPI.  Assessment & Plan: Visit Diagnoses:  1. Spinal stenosis of lumbar region without neurogenic claudication     Plan: Findings:  Diagnostic and therapeutic left L4 transforaminal epidural steroid injection for significant stenosis at L4-5. If she doesn't get much relief or doesn't last very long I would try an  injection at the L3-4 level. Alternatively Dr. Berline Chough to look for other sources of pain for the left hip pain even though she does have significant stenosis she does get quite a bit of pain with sitting. Question would be ischial bursitis.    Meds & Orders:  Meds ordered this encounter  Medications  . lidocaine (PF) (XYLOCAINE) 1 % injection 2 mL  . methylPREDNISolone acetate (DEPO-MEDROL) injection 80 mg    Orders Placed This Encounter  Procedures  . XR C-ARM NO REPORT  . Epidural Steroid injection    Follow-up: Return for Dr. Berline Chough 08/21/2016.   Procedures: No procedures performed  Lumbosacral Transforaminal Epidural Steroid Injection - Infraneural Approach with Fluoroscopic Guidance  Patient: Amy Calhoun      Date of Birth: 25-Jan-1928 MRN: [id number] PCP: Tommie Sams, DO      Visit Date: 07/10/2016   Universal Protocol:     Consent Given By: the patient  Position: PRONE   Additional Comments: Vital signs were monitored before and after the procedure. Patient was prepped and draped in the usual sterile fashion. The correct patient, procedure, and site was verified.   Injection Procedure Details:  Procedure Site One Meds Administered:  Meds ordered this encounter  Medications  . lidocaine (PF) (XYLOCAINE) 1 % injection 2 mL  . methylPREDNISolone acetate (DEPO-MEDROL) injection 80 mg      Laterality: Left  Location/Site:  L4-L5  Needle size: 22 G  Needle type: Spinal  Needle Placement: Transforaminal  Findings:  -  Contrast Used: 1 mL iohexol 180 mg iodine/mL   -Comments: Excellent flow of contrast along the nerve and into the epidural space.  Procedure Details: After squaring off the end-plates of the desired vertebral level to get a true AP view, the C-arm was obliqued to the painful side so that the superior articulating process is positioned about 1/3 the length of the inferior endplate.  The needle was aimed toward the junction of the superior  articular process and the transverse process of the inferior vertebrae. The needle's initial entry is in the lower third of the foramen through Kambin's triangle. The soft tissues overlying this target were infiltrated with 2-3 ml. of 1% Lidocaine without Epinephrine.  The spinal needle was then inserted and advanced toward the target using a "trajectory" view along the fluoroscope beam.  Under AP and lateral visualization, the needle was advanced so it did not puncture dura and did not traverse medially beyond the 6 o'clock position of the pedicle. Bi-planar projections were used to confirm position. Aspiration was confirmed to be negative for CSF and/or blood. A 1-2 ml. volume of Isovue-250 was injected and flow of contrast was noted at each level. Radiographs were obtained for documentation purposes.   After attaining the desired flow of contrast documented above, a 0.5 to 1.0 ml test dose of 0.25% Marcaine was injected into each respective transforaminal space.  The patient was observed for 90 seconds post injection.  After no sensory deficits were reported, and normal lower extremity motor function was noted,   the above injectate was administered so that equal amounts of the injectate were placed at each foramen (level) into the transforaminal epidural space.   Additional Comments:  The patient tolerated the procedure well Dressing: Band-Aid    Post-procedure details: Patient was observed during the procedure. Post-procedure instructions were reviewed.  Patient left the clinic in stable condition.   Clinical History: MRI Lumbar spine IMPRESSION: 1. 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 Stenosis most severe at the L4-5 level. 2. Multifactorial degenerative changes with resultant bilateral foraminal narrowing at L3-4 through L5-S1. Most  notable findings include severe right L3 foraminal stenosis, moderate right with severe left L4 foraminal narrowing, and moderate to severe left L5 foraminal stenosis.     Electronically Signed By: Rise MuBenjamin McClintock M.D. On: 06/21/2016 05:32  She reports that she has never smoked. She has never used smokeless tobacco.   Recent Labs  01/19/16 1506 03/30/16 0447 07/06/16 0924  HGBA1C 8.9* 7.4* 6.9*    Objective:  VS:  HT:    WT:   BMI:     BP:(!) 149/60  HR:73bpm  TEMP:97.8 F (36.6 C)(Oral)  RESP:91 % Physical Exam  Musculoskeletal:  Patient ambulates without aid with a slightly forward flexed spine. She is somewhat slow to rise from a seated position. She has good distal strength.    Ortho Exam Imaging: Xr C-arm No Report  Result Date: 07/10/2016 Please see Notes or Procedures tab for imaging impression.   Past Medical/Family/Surgical/Social History: Medications & Allergies reviewed per EMR Patient Active Problem List   Diagnosis Date Noted  . Anemia 07/06/2016  . Osteoarthritis of spine with radiculopathy, lumbar region 03/16/2016  . History of colitis 03/02/2016  . Primary osteoarthritis of left hip 03/02/2016  . CKD (chronic kidney disease) stage 3, GFR 30-59 ml/min 02/21/2016  . DM type 2 with diabetic peripheral neuropathy (  HCC) 01/19/2016  . Hyperlipidemia 01/19/2016  . Hypothyroidism 01/19/2016  . Essential hypertension 01/19/2016  . Chronic pain 01/19/2016  . Esophageal dysmotility 12/21/2015   Past Medical History:  Diagnosis Date  . Depression   . Esophageal dysphagia   . Gastric ulcer   . Hyperlipidemia   . Hypertension   . Hypothyroidism   . IDDM (insulin dependent diabetes mellitus) (HCC)   . Ischemic colitis (HCC)   . Osteoarthritis   . Ulcer of esophagus    Family History  Problem Relation Age of Onset  . Diabetes Mother   . Arthritis Mother   . Hyperlipidemia Mother   . Mental illness Mother   . Heart disease Father   .  Mental illness Sister   . Arthritis Maternal Grandmother   . Arthritis Maternal Grandfather    Past Surgical History:  Procedure Laterality Date  . ABDOMINAL HYSTERECTOMY    . GALLBLADDER SURGERY    . THYROIDECTOMY     Social History   Occupational History  . retired    Social History Main Topics  . Smoking status: Never Smoker  . Smokeless tobacco: Never Used  . Alcohol use No  . Drug use: No  . Sexual activity: No

## 2016-07-10 NOTE — Patient Instructions (Signed)

## 2016-07-10 NOTE — Progress Notes (Deleted)
Left sided lower back pain. No leg pain, no numbness or tingling. Sitting makes pain worse. Detailed history difficult to obtain.

## 2016-07-11 ENCOUNTER — Telehealth: Payer: Self-pay | Admitting: Pharmacist

## 2016-07-11 NOTE — Telephone Encounter (Signed)
Received CBG log from patients daughter.   Daughter states she received a spinal injection of steroid yesterday and she was having a mild reaction to the small amount of dye she was also given. She reports her blood sugar dropped to 67 mg/dL after the procedure on the way home.  Reports proper treatment.    Daughter denies other episodes of hypoglycemia.  Denies other complaints.  07/06/16 A1C 6.9%      A/P Continue current insulin regimen.  May need to consider decreasing insulin doses in the future given A1C.    Hazle NordmannKelsy Braedyn Riggle, PharmD, BCPS [facility] PGY2 Pharmacy Resident [phone]

## 2016-07-12 ENCOUNTER — Observation Stay
Admission: EM | Admit: 2016-07-12 | Discharge: 2016-07-13 | Disposition: A | Discharge status: Home / Self Care | Payer: Medicare HMO | Attending: Internal Medicine | Admitting: Internal Medicine

## 2016-07-12 ENCOUNTER — Ambulatory Visit (INDEPENDENT_AMBULATORY_CARE_PROVIDER_SITE_OTHER)
Admission: EM | Admit: 2016-07-12 | Discharge: 2016-07-12 | Disposition: A | Discharge status: Hospice | Payer: Medicare HMO | Source: Home / Self Care

## 2016-07-12 ENCOUNTER — Ambulatory Visit (INDEPENDENT_AMBULATORY_CARE_PROVIDER_SITE_OTHER): Payer: Medicare HMO

## 2016-07-12 ENCOUNTER — Encounter: Payer: Self-pay | Admitting: Emergency Medicine

## 2016-07-12 ENCOUNTER — Emergency Department: Payer: Medicare HMO

## 2016-07-12 DIAGNOSIS — Z8261 Family history of arthritis: Secondary | ICD-10-CM | POA: Diagnosis not present

## 2016-07-12 DIAGNOSIS — I10 Essential (primary) hypertension: Secondary | ICD-10-CM | POA: Diagnosis not present

## 2016-07-12 DIAGNOSIS — Z66 Do not resuscitate: Secondary | ICD-10-CM | POA: Insufficient documentation

## 2016-07-12 DIAGNOSIS — J9601 Acute respiratory failure with hypoxia: Secondary | ICD-10-CM | POA: Diagnosis not present

## 2016-07-12 DIAGNOSIS — I129 Hypertensive chronic kidney disease with stage 1 through stage 4 chronic kidney disease, or unspecified chronic kidney disease: Secondary | ICD-10-CM | POA: Diagnosis not present

## 2016-07-12 DIAGNOSIS — Z888 Allergy status to other drugs, medicaments and biological substances status: Secondary | ICD-10-CM | POA: Diagnosis not present

## 2016-07-12 DIAGNOSIS — Z8719 Personal history of other diseases of the digestive system: Secondary | ICD-10-CM | POA: Insufficient documentation

## 2016-07-12 DIAGNOSIS — J80 Acute respiratory distress syndrome: Secondary | ICD-10-CM | POA: Diagnosis not present

## 2016-07-12 DIAGNOSIS — G8929 Other chronic pain: Secondary | ICD-10-CM | POA: Diagnosis not present

## 2016-07-12 DIAGNOSIS — J189 Pneumonia, unspecified organism: Secondary | ICD-10-CM | POA: Diagnosis not present

## 2016-07-12 DIAGNOSIS — Z90710 Acquired absence of both cervix and uterus: Secondary | ICD-10-CM | POA: Insufficient documentation

## 2016-07-12 DIAGNOSIS — Z818 Family history of other mental and behavioral disorders: Secondary | ICD-10-CM | POA: Diagnosis not present

## 2016-07-12 DIAGNOSIS — R0602 Shortness of breath: Secondary | ICD-10-CM

## 2016-07-12 DIAGNOSIS — Z9889 Other specified postprocedural states: Secondary | ICD-10-CM | POA: Insufficient documentation

## 2016-07-12 DIAGNOSIS — E1122 Type 2 diabetes mellitus with diabetic chronic kidney disease: Secondary | ICD-10-CM | POA: Diagnosis not present

## 2016-07-12 DIAGNOSIS — R059 Cough, unspecified: Secondary | ICD-10-CM

## 2016-07-12 DIAGNOSIS — R0902 Hypoxemia: Secondary | ICD-10-CM | POA: Diagnosis not present

## 2016-07-12 DIAGNOSIS — E039 Hypothyroidism, unspecified: Secondary | ICD-10-CM | POA: Diagnosis not present

## 2016-07-12 DIAGNOSIS — Z794 Long term (current) use of insulin: Secondary | ICD-10-CM | POA: Insufficient documentation

## 2016-07-12 DIAGNOSIS — J181 Lobar pneumonia, unspecified organism: Principal | ICD-10-CM | POA: Insufficient documentation

## 2016-07-12 DIAGNOSIS — Z79899 Other long term (current) drug therapy: Secondary | ICD-10-CM | POA: Insufficient documentation

## 2016-07-12 DIAGNOSIS — Z833 Family history of diabetes mellitus: Secondary | ICD-10-CM | POA: Insufficient documentation

## 2016-07-12 DIAGNOSIS — F329 Major depressive disorder, single episode, unspecified: Secondary | ICD-10-CM | POA: Insufficient documentation

## 2016-07-12 DIAGNOSIS — R609 Edema, unspecified: Secondary | ICD-10-CM

## 2016-07-12 DIAGNOSIS — N183 Chronic kidney disease, stage 3 (moderate): Secondary | ICD-10-CM | POA: Diagnosis not present

## 2016-07-12 DIAGNOSIS — R05 Cough: Secondary | ICD-10-CM

## 2016-07-12 DIAGNOSIS — E119 Type 2 diabetes mellitus without complications: Secondary | ICD-10-CM | POA: Diagnosis not present

## 2016-07-12 DIAGNOSIS — M1612 Unilateral primary osteoarthritis, left hip: Secondary | ICD-10-CM | POA: Insufficient documentation

## 2016-07-12 DIAGNOSIS — Z8349 Family history of other endocrine, nutritional and metabolic diseases: Secondary | ICD-10-CM | POA: Diagnosis not present

## 2016-07-12 DIAGNOSIS — Z8249 Family history of ischemic heart disease and other diseases of the circulatory system: Secondary | ICD-10-CM | POA: Diagnosis not present

## 2016-07-12 DIAGNOSIS — E785 Hyperlipidemia, unspecified: Secondary | ICD-10-CM | POA: Insufficient documentation

## 2016-07-12 DIAGNOSIS — R600 Localized edema: Secondary | ICD-10-CM

## 2016-07-12 LAB — BASIC METABOLIC PANEL
Anion gap: 8 (ref 5–15)
BUN: 32 mg/dL — AB (ref 6–20)
CHLORIDE: 101 mmol/L (ref 101–111)
CO2: 27 mmol/L (ref 22–32)
CREATININE: 1.26 mg/dL — AB (ref 0.44–1.00)
Calcium: 8.8 mg/dL — ABNORMAL LOW (ref 8.9–10.3)
GFR calc Af Amer: 44 mL/min — ABNORMAL LOW (ref >60)
GFR calc non Af Amer: 38 mL/min — ABNORMAL LOW (ref >60)
GLUCOSE: 269 mg/dL — AB (ref 65–99)
Potassium: 4.6 mmol/L (ref 3.5–5.1)
SODIUM: 136 mmol/L (ref 135–145)

## 2016-07-12 LAB — CBC
HCT: 35.1 % (ref 35.0–47.0)
Hemoglobin: 11.9 g/dL — ABNORMAL LOW (ref 12.0–16.0)
MCH: 30 pg (ref 26.0–34.0)
MCHC: 33.9 g/dL (ref 32.0–36.0)
MCV: 88.6 fL (ref 80.0–100.0)
PLATELETS: 238 10*3/uL (ref 150–440)
RBC: 3.96 MIL/uL (ref 3.80–5.20)
RDW: 13.8 % (ref 11.5–14.5)
WBC: 11 10*3/uL (ref 3.6–11.0)

## 2016-07-12 LAB — GLUCOSE, CAPILLARY
Glucose-Capillary: 255 mg/dL — ABNORMAL HIGH (ref 65–99)
Glucose-Capillary: 314 mg/dL — ABNORMAL HIGH (ref 65–99)
Glucose-Capillary: 152 mg/dL — ABNORMAL HIGH (ref 65–99)

## 2016-07-12 LAB — TROPONIN I: Troponin I: <0.03 ng/mL (ref <0.03)

## 2016-07-12 MED ORDER — HEPARIN SODIUM (PORCINE) 5000 UNIT/ML IJ SOLN
5000.0000 [IU] | Freq: Three times a day (TID) | INTRAMUSCULAR | Status: DC
  Administered 2016-07-12 – 2016-07-13 (×2): 5000 [IU] via SUBCUTANEOUS
  Filled 2016-07-12 (×2): qty 1

## 2016-07-12 MED ORDER — INSULIN NPH (HUMAN) (ISOPHANE) 100 UNIT/ML ~~LOC~~ SUSP: 10.0000 [IU] | Freq: Two times a day (BID) | SUBCUTANEOUS | Status: DC

## 2016-07-12 MED ORDER — BENZONATATE 100 MG PO CAPS
100.0000 mg | ORAL_CAPSULE | Freq: Three times a day (TID) | ORAL | Status: DC | PRN
Start: 2016-07-12 — End: 2016-07-13
  Administered 2016-07-12: 19:00:00 100 mg via ORAL
  Filled 2016-07-12 (×2): qty 1

## 2016-07-12 MED ORDER — AMITRIPTYLINE HCL 75 MG PO TABS
150.0000 mg | ORAL_TABLET | Freq: Every day | ORAL | Status: DC
  Administered 2016-07-12: 21:00:00 150 mg via ORAL
  Filled 2016-07-12: qty 2

## 2016-07-12 MED ORDER — DEXTROSE 5 % IV SOLN
250.0000 mg | INTRAVENOUS | Status: DC
  Filled 2016-07-12: qty 250

## 2016-07-12 MED ORDER — DOCUSATE SODIUM 100 MG PO CAPS: 100.0000 mg | ORAL_CAPSULE | Freq: Two times a day (BID) | ORAL | Status: DC | PRN

## 2016-07-12 MED ORDER — BENZONATATE 100 MG PO CAPS: 100.0000 mg | ORAL_CAPSULE | Freq: Three times a day (TID) | ORAL | Status: DC

## 2016-07-12 MED ORDER — INSULIN DETEMIR 100 UNIT/ML ~~LOC~~ SOLN
10.0000 [IU] | Freq: Every day | SUBCUTANEOUS | Status: DC
  Administered 2016-07-12: 10 [IU] via SUBCUTANEOUS
  Filled 2016-07-12 (×2): qty 0.1

## 2016-07-12 MED ORDER — CEFTRIAXONE SODIUM 1 G IJ SOLR
1.0000 g | INTRAMUSCULAR | Status: DC
  Administered 2016-07-12: 1 g via INTRAVENOUS
  Filled 2016-07-12 (×2): qty 10

## 2016-07-12 MED ORDER — INSULIN REGULAR HUMAN 100 UNIT/ML IJ SOLN: 3.0000 [IU] | Freq: Three times a day (TID) | INTRAMUSCULAR | Status: DC

## 2016-07-12 MED ORDER — INSULIN ASPART 100 UNIT/ML ~~LOC~~ SOLN
3.0000 [IU] | Freq: Every day | SUBCUTANEOUS | Status: DC
  Administered 2016-07-13: 09:00:00 3 [IU] via SUBCUTANEOUS
  Filled 2016-07-12: qty 1

## 2016-07-12 MED ORDER — RISAQUAD PO CAPS
1.0000 | ORAL_CAPSULE | Freq: Every day | ORAL | Status: DC
  Administered 2016-07-12 – 2016-07-13 (×2): 1 via ORAL
  Filled 2016-07-12 (×2): qty 1

## 2016-07-12 MED ORDER — PANTOPRAZOLE SODIUM 40 MG PO TBEC
40.0000 mg | DELAYED_RELEASE_TABLET | Freq: Every day | ORAL | Status: DC
  Administered 2016-07-12 – 2016-07-13 (×2): 40 mg via ORAL
  Filled 2016-07-12 (×2): qty 1

## 2016-07-12 MED ORDER — LEVOTHYROXINE SODIUM 50 MCG PO TABS
150.0000 ug | ORAL_TABLET | Freq: Every day | ORAL | Status: DC
  Administered 2016-07-13: 150 ug via ORAL
  Filled 2016-07-12: qty 3

## 2016-07-12 MED ORDER — INSULIN ASPART 100 UNIT/ML ~~LOC~~ SOLN: 6.0000 [IU] | Freq: Every day | SUBCUTANEOUS | Status: DC

## 2016-07-12 MED ORDER — DOCUSATE SODIUM 100 MG PO CAPS
100.0000 mg | ORAL_CAPSULE | Freq: Two times a day (BID) | ORAL | Status: DC
  Administered 2016-07-12 – 2016-07-13 (×2): 100 mg via ORAL
  Filled 2016-07-12 (×2): qty 1

## 2016-07-12 MED ORDER — VITAMIN D 1000 UNITS PO TABS
2000.0000 [IU] | ORAL_TABLET | Freq: Every day | ORAL | Status: DC
  Administered 2016-07-12 – 2016-07-13 (×2): 2000 [IU] via ORAL
  Filled 2016-07-12 (×2): qty 2

## 2016-07-12 MED ORDER — LINAGLIPTIN 5 MG PO TABS
5.0000 mg | ORAL_TABLET | Freq: Every day | ORAL | Status: DC
  Administered 2016-07-12 – 2016-07-13 (×2): 5 mg via ORAL
  Filled 2016-07-12 (×2): qty 1

## 2016-07-12 MED ORDER — GABAPENTIN 100 MG PO CAPS
200.0000 mg | ORAL_CAPSULE | Freq: Three times a day (TID) | ORAL | Status: DC
  Administered 2016-07-12 – 2016-07-13 (×3): 200 mg via ORAL
  Filled 2016-07-12 (×3): qty 2

## 2016-07-12 MED ORDER — INSULIN ASPART 100 UNIT/ML ~~LOC~~ SOLN
6.0000 [IU] | Freq: Every day | SUBCUTANEOUS | Status: DC
  Administered 2016-07-12: 6 [IU] via SUBCUTANEOUS
  Filled 2016-07-12: qty 1

## 2016-07-12 MED ORDER — INSULIN ASPART 100 UNIT/ML ~~LOC~~ SOLN
0.0000 [IU] | Freq: Three times a day (TID) | SUBCUTANEOUS | Status: DC
  Administered 2016-07-12: 7 [IU] via SUBCUTANEOUS
  Administered 2016-07-13: 2 [IU] via SUBCUTANEOUS
  Filled 2016-07-12 (×2): qty 1

## 2016-07-12 MED ORDER — ACETAMINOPHEN 325 MG PO TABS: 650.0000 mg | ORAL_TABLET | Freq: Four times a day (QID) | ORAL | Status: DC | PRN

## 2016-07-12 MED ORDER — INSULIN DETEMIR 100 UNIT/ML ~~LOC~~ SOLN
20.0000 [IU] | Freq: Every day | SUBCUTANEOUS | Status: DC
  Administered 2016-07-13: 10:00:00 20 [IU] via SUBCUTANEOUS
  Filled 2016-07-12: qty 0.2

## 2016-07-12 MED ORDER — SIMVASTATIN 20 MG PO TABS
40.0000 mg | ORAL_TABLET | Freq: Every day | ORAL | Status: DC
  Administered 2016-07-12: 21:00:00 40 mg via ORAL
  Filled 2016-07-12: qty 2

## 2016-07-12 MED ORDER — IPRATROPIUM-ALBUTEROL 0.5-2.5 (3) MG/3ML IN SOLN
3.0000 mL | Freq: Four times a day (QID) | RESPIRATORY_TRACT | Status: DC
  Administered 2016-07-12 – 2016-07-13 (×3): 3 mL via RESPIRATORY_TRACT
  Filled 2016-07-12 (×3): qty 3

## 2016-07-12 MED ORDER — LISINOPRIL 10 MG PO TABS
10.0000 mg | ORAL_TABLET | Freq: Every day | ORAL | Status: DC
  Administered 2016-07-12 – 2016-07-13 (×2): 10 mg via ORAL
  Filled 2016-07-12 (×2): qty 1

## 2016-07-12 MED ORDER — LEVOFLOXACIN IN D5W 750 MG/150ML IV SOLN
750.0000 mg | Freq: Once | INTRAVENOUS | Status: AC
  Administered 2016-07-12: 750 mg via INTRAVENOUS
  Filled 2016-07-12: qty 150

## 2016-07-12 MED ORDER — OXYCODONE-ACETAMINOPHEN 5-325 MG PO TABS
1.0000 | ORAL_TABLET | Freq: Four times a day (QID) | ORAL | Status: DC | PRN
  Administered 2016-07-12: 1 via ORAL
  Filled 2016-07-12: qty 1

## 2016-07-12 NOTE — ED Provider Notes (Signed)
[facility] Emergency Department Provider Note   ____________________________________________   I have reviewed the triage vital signs and the nursing notes.   HISTORY  Chief Complaint Cough   History limited by: Not Limited   HPI Amy Calhoun is a 83 y.o. female who presents to the emergency department today from urgent care because of concerns for pneumonia. The patient started coughing yesterday. This started as a dry cough. It is not been productive. She has continued today. No fevers. No significant chest pain however given cough and recent epidural injection.oh was concern for possible infection. At urgent care she was noted to have sats in the 80s. Chest x-ray did show pneumonia.   Past Medical History:  Diagnosis Date  . Depression   . Esophageal dysphagia   . Gastric ulcer   . Hyperlipidemia   . Hypertension   . Hypothyroidism   . IDDM (insulin dependent diabetes mellitus) (HCC)   . Ischemic colitis (HCC)   . Osteoarthritis   . Ulcer of esophagus     Patient Active Problem List   Diagnosis Date Noted  . Anemia 07/06/2016  . Osteoarthritis of spine with radiculopathy, lumbar region 03/16/2016  . History of colitis 03/02/2016  . Primary osteoarthritis of left hip 03/02/2016  . CKD (chronic kidney disease) stage 3, GFR 30-59 ml/min 02/21/2016  . DM type 2 with diabetic peripheral neuropathy (HCC) 01/19/2016  . Hyperlipidemia 01/19/2016  . Hypothyroidism 01/19/2016  . Essential hypertension 01/19/2016  . Chronic pain 01/19/2016  . Esophageal dysmotility 12/21/2015    Past Surgical History:  Procedure Laterality Date  . ABDOMINAL HYSTERECTOMY    . GALLBLADDER SURGERY    . THYROIDECTOMY      Prior to Admission medications   Medication Sig Start Date End Date Taking? Authorizing Provider  acetaminophen-codeine (TYLENOL #3) 300-30 MG tablet Take 1 tablet by mouth every 8 (eight) hours as needed for moderate pain. 07/06/16   Andrena Mews, DO  amitriptyline (ELAVIL) 75 MG tablet Take 2 tablets (150 mg total) by mouth at bedtime. 02/13/16   Tommie Sams, DO  BD INSULIN SYRINGE ULTRAFINE 31G X 15/64" 0.5 ML MISC  02/22/16   [provider]  Cholecalciferol (VITAMIN D) 2000 units CAPS Take 2,000 Units by mouth daily.     [provider]  docusate sodium (COLACE) 100 MG capsule Take 1 capsule (100 mg total) by mouth 2 (two) times daily. 03/31/16   Katharina Caper, MD  gabapentin (NEURONTIN) 100 MG capsule Take 2 capsules (200 mg total) by mouth 3 (three) times daily. 03/27/16   Andrena Mews, DO  glucagon (GLUCAGON EMERGENCY) 1 MG injection Inject 1 mg into the vein once as needed. For hypoglycemic episodes. E11.42 03/13/16   Tommie Sams, DO  glucose blood (ONETOUCH VERIO) test strip USE AS INSTRUCTED to check sugars up to four times daily. E11.9 04/05/16   Tommie Sams, DO  insulin NPH Human (HUMULIN N,NOVOLIN N) 100 UNIT/ML injection 20 units every morning and 10 units at night 06/12/16   Cook, Jayce G, DO  insulin regular (NOVOLIN R,HUMULIN R) 100 units/mL injection 3 units before breakfast , 6 units before lunch , 6 units before dinner Please dispense 90 day supply 06/13/16   Everlene Other G, DO  INSULIN SYRINGE 1CC/29G 29G X 1/2" 1 ML MISC As directed to administer insulin 02/21/16   Tommie Sams, DO  levothyroxine (SYNTHROID, LEVOTHROID) 150 MCG tablet Take 1 tablet (150 mcg total) by mouth  daily. 04/03/16   Tommie Samsook, Jayce G, DO  lidocaine (LIDODERM) 5 % PLACE 1 PATCH ON THE SKIN DAILY 06/26/16   Everlene Otherook, Jayce G, DO  linagliptin (TRADJENTA) 5 MG TABS tablet Take 1 tablet (5 mg total) by mouth daily. 04/05/16   Tommie Samsook, Jayce G, DO  lisinopril (PRINIVIL,ZESTRIL) 10 MG tablet Take 1 tablet (10 mg total) by mouth daily. 01/19/16   Tommie Samsook, Jayce G, DO  omeprazole (PRILOSEC) 40 MG capsule Take 1 capsule (40 mg total) by mouth daily. 01/19/16   Tommie Samsook, Jayce G, DO  polyethylene glycol (MIRALAX) packet Take 17 g by mouth daily. 03/31/16    Katharina CaperVaickute, Rima, MD  pyridOXINE (VITAMIN B-6) 100 MG tablet Take 100 mg by mouth daily.    [provider]  riboflavin (VITAMIN B-2) 100 MG TABS tablet Take 100 mg by mouth daily.    [provider]  senna-docusate (SENOKOT-S) 8.6-50 MG tablet Take 1 tablet by mouth at bedtime. 03/31/16   Katharina CaperVaickute, Rima, MD  simvastatin (ZOCOR) 40 MG tablet Take 40 mg by mouth at bedtime.     [provider]    Allergies Pravastatin  Family History  Problem Relation Age of Onset  . Diabetes Mother   . Arthritis Mother   . Hyperlipidemia Mother   . Mental illness Mother   . Heart disease Father   . Mental illness Sister   . Arthritis Maternal Grandmother   . Arthritis Maternal Grandfather     Social History Social History  Substance Use Topics  . Smoking status: Never Smoker  . Smokeless tobacco: Never Used  . Alcohol use No    Review of Systems Constitutional: No fever/chills Eyes: No visual changes. ENT: No sore throat. Cardiovascular: Denies chest pain. Respiratory: Positive for cough. Gastrointestinal: No abdominal pain.  No nausea, no vomiting.  No diarrhea.   Genitourinary: Negative for dysuria. Musculoskeletal: Negative for back pain. Skin: Negative for rash. Neurological: Negative for headaches, focal weakness or numbness.  ____________________________________________   PHYSICAL EXAM:  VITAL SIGNS: ED Triage Vitals  Enc Vitals Group     BP 07/12/16 1047 (!) 107/36     Pulse Rate 07/12/16 1047 90     Resp 07/12/16 1047 16     Temp 07/12/16 1047 98.7 F (37.1 C)     Temp Source 07/12/16 1047 Oral     SpO2 07/12/16 1047 93 %     Weight 07/12/16 1050 166 lb (75.3 kg)     Height 07/12/16 1050 5\' 6"  (1.676 m)   Constitutional: Alert and oriented. Well appearing and in no distress. Eyes: Conjunctivae are normal.  ENT   Head: Normocephalic and atraumatic.   Nose: No congestion/rhinnorhea.   Mouth/Throat: Mucous membranes are  moist.   Neck: No stridor. Hematological/Lymphatic/Immunilogical: No cervical lymphadenopathy. Cardiovascular: Normal rate, regular rhythm.  No murmurs, rubs, or gallops.  Respiratory: Normal respiratory effort without tachypnea nor retractions. Breath sounds are clear and equal bilaterally. No wheezes/rales/rhonchi. Occasional dry cough. Gastrointestinal: Soft and non tender. No rebound. No guarding.  Genitourinary: Deferred Musculoskeletal: Normal range of motion in all extremities. No lower extremity edema. Neurologic:  Normal speech and language. No gross focal neurologic deficits are appreciated.  Skin:  Skin is warm, dry and intact. No rash noted. Psychiatric: Mood and affect are normal. Speech and behavior are normal. Patient exhibits appropriate insight and judgment.  ____________________________________________    LABS (pertinent positives/negatives)  Labs Reviewed  BASIC METABOLIC PANEL - Abnormal; Notable for the following:  Result Value   Glucose, Bld 269 (*)    BUN 32 (*)    Creatinine, Ser 1.26 (*)    Calcium 8.8 (*)    GFR calc non Af Amer 38 (*)    GFR calc Af Amer 44 (*)    All other components within normal limits  CBC - Abnormal; Notable for the following:    Hemoglobin 11.9 (*)    All other components within normal limits  GLUCOSE, CAPILLARY - Abnormal; Notable for the following:    Glucose-Capillary 255 (*)    All other components within normal limits  CULTURE, BLOOD (ROUTINE X 2)  CULTURE, BLOOD (ROUTINE X 2)  TROPONIN I     ____________________________________________   EKG  I, Phineas Semen, attending physician, personally viewed and interpreted this EKG  EKG Time: 1105 Rate: 91 Rhythm: normal sinus rhythm Axis: left axis deviatino Intervals: qtc 487 QRS: RBBB ST changes: no st elevation, t wave inversion V1, V2 Impression: abnormal ekg   ____________________________________________    RADIOLOGY  CXR Consistent with left  lower lobe pneumonia.  ____________________________________________   PROCEDURES  Procedures  ____________________________________________   INITIAL IMPRESSION / ASSESSMENT AND PLAN / ED COURSE  Pertinent labs & imaging results that were available during my care of the patient were reviewed by me and considered in my medical decision making (see chart for details).  Patient presented to the emergency department today from urgent care because of concerns for pneumonia. Chest x-ray done in urgent cares consistent with pneumonia. Patient O2 saturations were dropping into the mid to high 80s on room air. Will plan on IV antibiotics and admission.  ____________________________________________   FINAL CLINICAL IMPRESSION(S) / ED DIAGNOSES  Final diagnoses:  Cough  Community acquired pneumonia of left lower lobe of lung (HCC)     Note: This dictation was prepared with Nurse, children's dictation. Any transcriptional errors that result from this process are unintentional     Phineas Semen, MD 07/13/16 1210

## 2016-07-12 NOTE — ED Notes (Signed)
ED Provider at bedside. 2L O2 placed on patient at this time per MD

## 2016-07-12 NOTE — Discharge Instructions (Signed)
-  strongly recommend going to ER for evaluation in relation to pneumonia, hypoxia, chronic kidney disease, and edema. Not going to the ER can result in worsening of symptoms that could happen quickly

## 2016-07-12 NOTE — ED Triage Notes (Signed)
Patient complains of cough and shortness of breath since yesterday. Patient has no known lung problems. Patient states that cough worsened yesterday after being outside in the humitditity. Patient states that she has tried tussin and benadryl without relief.

## 2016-07-12 NOTE — ED Notes (Signed)
2L O2 turned down to 1L O2

## 2016-07-12 NOTE — ED Notes (Signed)
Pt from urgent. Had CXR with [facility].

## 2016-07-12 NOTE — ED Provider Notes (Signed)
CSN: [id number]     Arrival date & time 07/12/16  0820 History   First MD Initiated Contact with Patient 07/12/16 [phone]     Chief Complaint  Patient presents with  . Shortness of Breath   (Consider location/radiation/quality/duration/timing/severity/associated sxs/prior Treatment) Patient is a 83 year old female who presents with her daughter with complaint of shortness of breath And cough that started yesterday. Daughter states that the cough has been nonproductive and that the patient was up all night with her cough with saturations 83-86%. Daughter states that she is a nurse was up with her mother who currently lives in a retirement community. Patient has had increasing shortness of breath with exercise over the last several months. Her daughter states that she moved here last [place] from [place] and believes that her mom has had problems dealing with humidity here. She states that they went downstairs to do some laundry but that her mom became extremely short of breath try to get back up the stairs. Daughter denies any history of lung issues, never smoked, and no oxygen use at home. Patient took some penicillin and Benadryl overnight with no improvement. Patient does report some dizziness but denies chest pain, fevers, and chills. She does report some throat irritation and sinus problems.   Patient noted to have saturations of 84% on room air. Increased to 87% on 2 L and then mid 90s on 3 L/m       Past Medical History:  Diagnosis Date  . Depression   . Esophageal dysphagia   . Gastric ulcer   . Hyperlipidemia   . Hypertension   . Hypothyroidism   . IDDM (insulin dependent diabetes mellitus) (HCC)   . Ischemic colitis (HCC)   . Osteoarthritis   . Ulcer of esophagus    Past Surgical History:  Procedure Laterality Date  . ABDOMINAL HYSTERECTOMY    . GALLBLADDER SURGERY    . THYROIDECTOMY     Family History  Problem Relation Age of Onset  . Diabetes Mother   . Arthritis  Mother   . Hyperlipidemia Mother   . Mental illness Mother   . Heart disease Father   . Mental illness Sister   . Arthritis Maternal Grandmother   . Arthritis Maternal Grandfather    Social History  Substance Use Topics  . Smoking status: Never Smoker  . Smokeless tobacco: Never Used  . Alcohol use No   OB History    No data available     Review of Systems  Constitutional: Negative for chills and fever.  HENT: Positive for rhinorrhea, sinus pressure and sore throat. Negative for congestion.   Respiratory: Positive for shortness of breath (worsening with movement). Wheezing:     Cardiovascular: Negative for chest pain.  Gastrointestinal: Negative.   Endocrine: Negative.   Genitourinary: Negative.   Neurological: Negative.     Allergies  Pravastatin  Home Medications   Prior to Admission medications   Medication Sig Start Date End Date Taking? Authorizing Provider  acetaminophen-codeine (TYLENOL #3) 300-30 MG tablet Take 1 tablet by mouth every 8 (eight) hours as needed for moderate pain. 07/06/16  Yes Andrena Mewsigby, Delroy Ordway D, DO  amitriptyline (ELAVIL) 75 MG tablet Take 2 tablets (150 mg total) by mouth at bedtime. 02/13/16  Yes Cook, Dorie RankJayce G, DO  BD INSULIN SYRINGE ULTRAFINE 31G X 15/64" 0.5 ML MISC  02/22/16  Yes [provider]  Cholecalciferol (VITAMIN D) 2000 units CAPS Take 2,000 Units by mouth daily.    Yes [provider]  docusate sodium (COLACE) 100 MG capsule Take 1 capsule (100 mg total) by mouth 2 (two) times daily. 03/31/16  Yes Katharina Caper, MD  gabapentin (NEURONTIN) 100 MG capsule Take 2 capsules (200 mg total) by mouth 3 (three) times daily. 03/27/16  Yes Andrena Mews, DO  glucagon (GLUCAGON EMERGENCY) 1 MG injection Inject 1 mg into the vein once as needed. For hypoglycemic episodes. E11.42 03/13/16  Yes Cook, Jayce G, DO  glucose blood (ONETOUCH VERIO) test strip USE AS INSTRUCTED to check sugars up to four times daily. E11.9 04/05/16  Yes Cook,  Jayce G, DO  insulin NPH Human (HUMULIN N,NOVOLIN N) 100 UNIT/ML injection 20 units every morning and 10 units at night 06/12/16  Yes Cook, Jayce G, DO  insulin regular (NOVOLIN R,HUMULIN R) 100 units/mL injection 3 units before breakfast , 6 units before lunch , 6 units before dinner Please dispense 90 day supply 06/13/16  Yes Cook, Jayce G, DO  INSULIN SYRINGE 1CC/29G 29G X 1/2" 1 ML MISC As directed to administer insulin 02/21/16  Yes Cook, Jayce G, DO  levothyroxine (SYNTHROID, LEVOTHROID) 150 MCG tablet Take 1 tablet (150 mcg total) by mouth daily. 04/03/16  Yes Cook, Jayce G, DO  lidocaine (LIDODERM) 5 % PLACE 1 PATCH ON THE SKIN DAILY 06/26/16  Yes Cook, Jayce G, DO  linagliptin (TRADJENTA) 5 MG TABS tablet Take 1 tablet (5 mg total) by mouth daily. 04/05/16  Yes Cook, Jayce G, DO  lisinopril (PRINIVIL,ZESTRIL) 10 MG tablet Take 1 tablet (10 mg total) by mouth daily. 01/19/16  Yes Cook, Jayce G, DO  omeprazole (PRILOSEC) 40 MG capsule Take 1 capsule (40 mg total) by mouth daily. 01/19/16  Yes Cook, Jayce G, DO  polyethylene glycol (MIRALAX) packet Take 17 g by mouth daily. 03/31/16  Yes Katharina Caper, MD  pyridOXINE (VITAMIN B-6) 100 MG tablet Take 100 mg by mouth daily.   Yes [provider]  riboflavin (VITAMIN B-2) 100 MG TABS tablet Take 100 mg by mouth daily.   Yes [provider]  senna-docusate (SENOKOT-S) 8.6-50 MG tablet Take 1 tablet by mouth at bedtime. 03/31/16  Yes Katharina Caper, MD  simvastatin (ZOCOR) 40 MG tablet Take 40 mg by mouth at bedtime.    Yes [provider]   Meds Ordered and Administered this Visit  Medications - No data to display  BP (!) 133/51 (BP Location: Left Arm)   Pulse 93   Temp 98.7 F (37.1 C) (Oral)   Resp 19   Ht 5\' 6"  (1.676 m)   Wt 166 lb 14.4 oz (75.7 kg)   SpO2 91%   BMI 26.94 kg/m  No data found.   Physical Exam  Constitutional: She is oriented to person, place, and time. She appears well-developed and  well-nourished.  HENT:  Head: Normocephalic and atraumatic.  Right Ear: External ear normal.  Left Ear: External ear normal.  Eyes: EOM are normal. Pupils are equal, round, and reactive to light.  Neck: Normal range of motion. Neck supple.  Cardiovascular: Normal rate, regular rhythm and normal heart sounds.   Pulmonary/Chest: Effort normal. She has wheezes (end expiratory on forced exhalation). She has no rales.  Saturation 84% on room air, up to 87% on 2 lpm, then 95% on 3 lpm  Abdominal: Soft. Bowel sounds are normal.  Musculoskeletal: She exhibits edema (bilateral pretibial edema ~1+).  Lymphadenopathy:    She has no cervical adenopathy.  Neurological: She is alert and oriented to person,  place, and time.  Skin: Skin is warm and dry.    Urgent Care Course   Clinical Course as of Jul 12 940  Thu Jul 12, 2016  0904 DG Chest 2 View [MH]  [phone] DG Chest 2 View [MH]  [phone] DG Chest 2 View [MH]    Clinical Course User Index [MH] Candis Schatz, PA-C    Procedures (including critical care time)  Labs Review Labs Reviewed - No data to display  Imaging Review Dg Chest 2 View  Result Date: 07/12/2016 CLINICAL DATA:  Shortness of breath. EXAM: CHEST  2 VIEW COMPARISON:  Chest x-ray 03/28/2016. FINDINGS: Mediastinum hilar structures normal. Heart size normal. Left lower lobe infiltrate consistent pneumonia. No pleural effusion or pneumothorax. Old left rib fractures . IMPRESSION: Left lower lobe infiltrate consistent with pneumonia . Electronically Signed   By: Maisie Fus  Register   On: 07/12/2016 09:09   Xr C-arm No Report  Result Date: 07/10/2016 Please see Notes or Procedures tab for imaging impression.    MDM   1. Hypoxia   2. SOB (shortness of breath)   3. Community acquired pneumonia of left lower lobe of lung (HCC)   4. Peripheral edema    Patient presented with recent worsening shortness of breath, cough that started last night, and hypoxia with saturation of 84%  on room air. No history of smoking or lung disease per daughter. Chest x-ray with appearance of left lower infiltrate as noted above. Question mild diffuse edema on x-ray in setting of pretibial edema. This in addition to her age, chronic kidney disease, and diabetes, it was recommended that patient present to ER for further evaluation and management. Patient and daughter both desire not to have to go to the hospital but provided them with my concerns in regards to the above. They said they would go to the [facility] for further evaluation. ER contacted by phone and the triage nurse was given information.  Candis Schatz, PA-C     Candis Schatz, PA-C 07/12/16 1028

## 2016-07-12 NOTE — H&P (Signed)
[facility] [place] [facility]   PATIENT NAME: Amy Calhoun    MR#:  [id number]  DATE OF BIRTH:  01/08/1929  DATE OF ADMISSION:  07/12/2016  PRIMARY CARE PHYSICIAN: Tommie Sams, DO   REQUESTING/REFERRING PHYSICIAN: Derrill Kay  CHIEF COMPLAINT:   Chief Complaint  Patient presents with  . Cough    HISTORY OF PRESENT ILLNESS: Amy Calhoun  is a 83 y.o. female with a known history of Hyperlipidemia, hypertension, hypothyroidism, insulin-dependent diabetes mellitus, ischemic colitis- lives in independent living facility and since yesterday afternoon after coming from her walking humid environment she is having shortness of breath. Initially daughter thought it is just because of being out in humid environment but this thing continued until today and having some cough so decided to come to emergency room. In ER she is noted to have some infiltrate on her right lower lobe and she is also hypoxic on room air up to 84% which came out with 2 L of oxygen supplementation. Concerned with this ER physician suggested to admit for treatment of pneumonia and hypoxia.  PAST MEDICAL HISTORY:   Past Medical History:  Diagnosis Date  . Depression   . Esophageal dysphagia   . Gastric ulcer   . Hyperlipidemia   . Hypertension   . Hypothyroidism   . IDDM (insulin dependent diabetes mellitus) (HCC)   . Ischemic colitis (HCC)   . Osteoarthritis   . Ulcer of esophagus     PAST SURGICAL HISTORY: Past Surgical History:  Procedure Laterality Date  . ABDOMINAL HYSTERECTOMY    . GALLBLADDER SURGERY    . THYROIDECTOMY      SOCIAL HISTORY:  Social History  Substance Use Topics  . Smoking status: Never Smoker  . Smokeless tobacco: Never Used  . Alcohol use No    FAMILY HISTORY:  Family History  Problem Relation Age of Onset  . Diabetes Mother   . Arthritis Mother   . Hyperlipidemia Mother   . Mental illness Mother   . Heart disease Father   . Mental illness Sister   .  Arthritis Maternal Grandmother   . Arthritis Maternal Grandfather     DRUG ALLERGIES:  Allergies  Allergen Reactions  . Pravastatin Other (See Comments)    States she refused due to side effects    REVIEW OF SYSTEMS:   CONSTITUTIONAL: No fever, fatigue or weakness.  EYES: No blurred or double vision.  EARS, NOSE, AND THROAT: No tinnitus or ear pain.  RESPIRATORY: positive for cough, shortness of breath,no wheezing or hemoptysis.  CARDIOVASCULAR: No chest pain, orthopnea, edema.  GASTROINTESTINAL: No nausea, vomiting, diarrhea or abdominal pain.  GENITOURINARY: No dysuria, hematuria.  ENDOCRINE: No polyuria, nocturia,  HEMATOLOGY: No anemia, easy bruising or bleeding SKIN: No rash or lesion. MUSCULOSKELETAL: No joint pain or arthritis.   NEUROLOGIC: No tingling, numbness, weakness.  PSYCHIATRY: No anxiety or depression.   MEDICATIONS AT HOME:  Prior to Admission medications   Medication Sig Start Date End Date Taking? Authorizing Provider  acetaminophen-codeine (TYLENOL #3) 300-30 MG tablet Take 1 tablet by mouth every 8 (eight) hours as needed for moderate pain. 07/06/16  Yes Andrena Mews, DO  acidophilus (RISAQUAD) CAPS capsule Take 1 capsule by mouth daily.   Yes [provider]  amitriptyline (ELAVIL) 75 MG tablet Take 2 tablets (150 mg total) by mouth at bedtime. 02/13/16  Yes Cook, Dorie Rank G, DO  Cholecalciferol (VITAMIN D) 2000 units CAPS Take 2,000 Units by mouth daily.  Yes [provider]  CRANBERRY PO Take 1 tablet by mouth every Monday, Wednesday, and Friday.   Yes [provider]  docusate sodium (COLACE) 100 MG capsule Take 1 capsule (100 mg total) by mouth 2 (two) times daily. 03/31/16  Yes Katharina Caper, MD  gabapentin (NEURONTIN) 100 MG capsule Take 2 capsules (200 mg total) by mouth 3 (three) times daily. 03/27/16  Yes Andrena Mews, DO  glucagon (GLUCAGON EMERGENCY) 1 MG injection Inject 1 mg into the vein once as needed. For  hypoglycemic episodes. E11.42 03/13/16  Yes Cook, Jayce G, DO  insulin NPH Human (HUMULIN N,NOVOLIN N) 100 UNIT/ML injection 20 units every morning and 10 units at night 06/12/16  Yes Cook, Jayce G, DO  insulin regular (NOVOLIN R,HUMULIN R) 100 units/mL injection 3 units before breakfast , 6 units before lunch , 6 units before dinner Please dispense 90 day supply 06/13/16  Yes Cook, Jayce G, DO  levothyroxine (SYNTHROID, LEVOTHROID) 150 MCG tablet Take 1 tablet (150 mcg total) by mouth daily. 04/03/16  Yes Cook, Jayce G, DO  lidocaine (LIDODERM) 5 % PLACE 1 PATCH ON THE SKIN DAILY 06/26/16  Yes Cook, Jayce G, DO  linagliptin (TRADJENTA) 5 MG TABS tablet Take 1 tablet (5 mg total) by mouth daily. 04/05/16  Yes Cook, Jayce G, DO  lisinopril (PRINIVIL,ZESTRIL) 10 MG tablet Take 1 tablet (10 mg total) by mouth daily. 01/19/16  Yes Cook, Jayce G, DO  omeprazole (PRILOSEC) 40 MG capsule Take 1 capsule (40 mg total) by mouth daily. 01/19/16  Yes Cook, Jayce G, DO  pyridOXINE (VITAMIN B-6) 100 MG tablet Take 100 mg by mouth daily.   Yes [provider]  riboflavin (VITAMIN B-2) 100 MG TABS tablet Take 100 mg by mouth daily.   Yes [provider]  simvastatin (ZOCOR) 40 MG tablet Take 40 mg by mouth at bedtime.    Yes [provider]  glucose blood (ONETOUCH VERIO) test strip USE AS INSTRUCTED to check sugars up to four times daily. E11.9 04/05/16   Tommie Sams, DO  INSULIN SYRINGE 1CC/29G 29G X 1/2" 1 ML MISC As directed to administer insulin 02/21/16   Everlene Other G, DO  polyethylene glycol [facility]) packet Take 17 g by mouth daily. Patient not taking: Reported on 07/12/2016 03/31/16   Katharina Caper, MD  senna-docusate (SENOKOT-S) 8.6-50 MG tablet Take 1 tablet by mouth at bedtime. Patient not taking: Reported on 07/12/2016 03/31/16   Katharina Caper, MD      PHYSICAL EXAMINATION:   VITAL SIGNS: Blood pressure (!) 159/70, pulse 86, temperature 98.7 F (37.1 C), temperature source Oral,  resp. rate 19, height 5\' 6"  (1.676 m), weight 75.3 kg (166 lb), SpO2 97 %.  GENERAL:  83 y.o.-year-old patient lying in the bed with no acute distress.  EYES: Pupils equal, round, reactive to light and accommodation. No scleral icterus. Extraocular muscles intact.  HEENT: Head atraumatic, normocephalic. Oropharynx and nasopharynx clear.  NECK:  Supple, no jugular venous distention. No thyroid enlargement, no tenderness.  LUNGS: Normal breath sounds bilaterally, no wheezing, Bilateral some crepitation. No use of accessory muscles of respiration.  CARDIOVASCULAR: S1, S2 normal. No murmurs, rubs, or gallops.  ABDOMEN: Soft, nontender, nondistended. Bowel sounds present. No organomegaly or mass.  EXTREMITIES: No pedal edema, cyanosis, or clubbing.  NEUROLOGIC: Cranial nerves II through XII are intact. Muscle strength 5/5 in all extremities. Sensation intact. Gait not checked.  PSYCHIATRIC: The patient is alert and oriented x 3.  SKIN:  No obvious rash, lesion, or ulcer.   LABORATORY PANEL:   CBC  Recent Labs Lab 07/06/16 0924 07/12/16 1106  WBC 7.0 11.0  HGB 11.2* 11.9*  HCT 33.9* 35.1  PLT 249.0 238  MCV 89.8 88.6  MCH  --  30.0  MCHC 33.1 33.9  RDW 13.6 13.8   ------------------------------------------------------------------------------------------------------------------  Chemistries   Recent Labs Lab 07/06/16 0924 07/12/16 1106  NA 136 136  K 5.2* 4.6  CL 100 101  CO2 28 27  GLUCOSE 364* 269*  BUN 26* 32*  CREATININE 1.29* 1.26*  CALCIUM 8.8 8.8*  AST 14  --   ALT 12  --   ALKPHOS 69  --   BILITOT 0.3  --    ------------------------------------------------------------------------------------------------------------------ estimated creatinine clearance is 33.9 mL/min (A) (by C-G formula based on SCr of 1.26 mg/dL (H)). ------------------------------------------------------------------------------------------------------------------ No results for input(s): TSH,  T4TOTAL, T3FREE, THYROIDAB in the last 72 hours.  Invalid input(s): FREET3   Coagulation profile No results for input(s): INR, PROTIME in the last 168 hours. ------------------------------------------------------------------------------------------------------------------- No results for input(s): DDIMER in the last 72 hours. -------------------------------------------------------------------------------------------------------------------  Cardiac Enzymes  Recent Labs Lab 07/12/16 1106  TROPONINI <0.03   ------------------------------------------------------------------------------------------------------------------ Invalid input(s): POCBNP  ---------------------------------------------------------------------------------------------------------------  Urinalysis    Component Value Date/Time   COLORURINE AMBER (A) 03/28/2016 2008   APPEARANCEUR CLOUDY (A) 03/28/2016 2008   LABSPEC 1.011 03/28/2016 2008   PHURINE 5.0 03/28/2016 2008   GLUCOSEU NEGATIVE 03/28/2016 2008   HGBUR SMALL (A) 03/28/2016 2008   BILIRUBINUR NEGATIVE 03/28/2016 2008   KETONESUR NEGATIVE 03/28/2016 2008   PROTEINUR 100 (A) 03/28/2016 2008   NITRITE POSITIVE (A) 03/28/2016 2008   LEUKOCYTESUR MODERATE (A) 03/28/2016 2008     RADIOLOGY: Dg Chest 2 View  Result Date: 07/12/2016 CLINICAL DATA:  Shortness of breath. EXAM: CHEST  2 VIEW COMPARISON:  Chest x-ray 03/28/2016. FINDINGS: Mediastinum hilar structures normal. Heart size normal. Left lower lobe infiltrate consistent pneumonia. No pleural effusion or pneumothorax. Old left rib fractures . IMPRESSION: Left lower lobe infiltrate consistent with pneumonia . Electronically Signed   By: Maisie Fus  Register   On: 07/12/2016 09:09   Xr C-arm No Report  Result Date: 07/10/2016 Please see Notes or Procedures tab for imaging impression.   EKG: Orders placed or performed during the hospital encounter of 07/12/16  . ED EKG within 10 minutes  . ED EKG  within 10 minutes    IMPRESSION AND PLAN:  * Ac respi failure with hypoxic   Community acquired pneumonia.    IV rocephin and azithromycin   Oxygen supplement   cHECK FOR supplemental oxygen at the time of discharge.    * Hypertension    continue home medications.  * Diabetes   We'll continue her home insulin doses and oral medication and keep on sliding scale coverage.  * Hypothyroidism   Continue levothyroxine.  * Hyperlipidemia   Continue statin.  * Chronic kidney disease stage III   Appears stable at baseline, continue monitoring.    All the records are reviewed and case discussed with ED provider. Management plans discussed with the patient, family and they are in agreement.  CODE STATUS: full. Code Status History    Date Active Date Inactive Code Status Order ID Comments User Context   03/28/2016 11:56 PM 03/31/2016  4:19 PM Full Code [id number]  Hugelmeyer, Jon Gills, DO Inpatient     Patient's daughter was present in the room during my visit.   TOTAL TIME TAKING CARE OF THIS PATIENT:  45 minutes.    Altamese DillingVACHHANI, Lorijean Husser M.D on 07/12/2016   Between 7am to 6pm - Pager - [phone]  After 6pm go to [email or web address] - password [email or web address] [facility]  [facility]  Office  [phone]  CC: Primary care physician; Tommie Samsook, Jayce G, DO   Note: This dictation was prepared with Dragon dictation along with smaller phrase technology. Any transcriptional errors that result from this process are unintentional.

## 2016-07-12 NOTE — ED Triage Notes (Signed)
Pt c/o cough and SHOB since yesterday.  No chest pain.  Dry cough.  Unlabored in triage.  NAD. VSS.  No pain or fevers.

## 2016-07-13 ENCOUNTER — Telehealth: Payer: Self-pay | Admitting: *Deleted

## 2016-07-13 ENCOUNTER — Telehealth: Payer: Self-pay | Admitting: Family Medicine

## 2016-07-13 DIAGNOSIS — N183 Chronic kidney disease, stage 3 (moderate): Secondary | ICD-10-CM | POA: Diagnosis not present

## 2016-07-13 DIAGNOSIS — J189 Pneumonia, unspecified organism: Secondary | ICD-10-CM | POA: Diagnosis not present

## 2016-07-13 DIAGNOSIS — R69 Illness, unspecified: Secondary | ICD-10-CM | POA: Diagnosis not present

## 2016-07-13 DIAGNOSIS — E119 Type 2 diabetes mellitus without complications: Secondary | ICD-10-CM | POA: Diagnosis not present

## 2016-07-13 DIAGNOSIS — E039 Hypothyroidism, unspecified: Secondary | ICD-10-CM | POA: Diagnosis not present

## 2016-07-13 LAB — GLUCOSE, CAPILLARY: GLUCOSE-CAPILLARY: 188 mg/dL — AB (ref 65–99)

## 2016-07-13 LAB — CBC
HCT: 31.1 % — ABNORMAL LOW (ref 35.0–47.0)
Hemoglobin: 10.7 g/dL — ABNORMAL LOW (ref 12.0–16.0)
MCH: 30.3 pg (ref 26.0–34.0)
MCHC: 34.5 g/dL (ref 32.0–36.0)
MCV: 87.8 fL (ref 80.0–100.0)
PLATELETS: 186 10*3/uL (ref 150–440)
RBC: 3.54 MIL/uL — AB (ref 3.80–5.20)
RDW: 13.7 % (ref 11.5–14.5)
WBC: 8.8 10*3/uL (ref 3.6–11.0)

## 2016-07-13 LAB — BASIC METABOLIC PANEL
ANION GAP: 6 (ref 5–15)
BUN: 26 mg/dL — ABNORMAL HIGH (ref 6–20)
CALCIUM: 8.3 mg/dL — AB (ref 8.9–10.3)
CO2: 28 mmol/L (ref 22–32)
CREATININE: 0.98 mg/dL (ref 0.44–1.00)
Chloride: 102 mmol/L (ref 101–111)
GFR, EST AFRICAN AMERICAN: 59 mL/min — AB (ref >60)
GFR, EST NON AFRICAN AMERICAN: 51 mL/min — AB (ref >60)
Glucose, Bld: 196 mg/dL — ABNORMAL HIGH (ref 65–99)
Potassium: 4.1 mmol/L (ref 3.5–5.1)
SODIUM: 136 mmol/L (ref 135–145)

## 2016-07-13 MED ORDER — LEVOFLOXACIN 750 MG PO TABS: 750.0000 mg | ORAL_TABLET | ORAL | 0 refills | Status: DC

## 2016-07-13 MED ORDER — BENZONATATE 100 MG PO CAPS: 100.0000 mg | ORAL_CAPSULE | Freq: Three times a day (TID) | ORAL | 0 refills | Status: DC | PRN

## 2016-07-13 MED ORDER — ALBUTEROL SULFATE (2.5 MG/3ML) 0.083% IN NEBU: 2.5000 mg | INHALATION_SOLUTION | Freq: Four times a day (QID) | RESPIRATORY_TRACT | Status: DC | PRN

## 2016-07-13 MED ORDER — ALBUTEROL SULFATE HFA 108 (90 BASE) MCG/ACT IN AERS: 1.0000 | INHALATION_SPRAY | Freq: Four times a day (QID) | RESPIRATORY_TRACT | Status: DC | PRN

## 2016-07-13 MED ORDER — ALBUTEROL SULFATE HFA 108 (90 BASE) MCG/ACT IN AERS: 2.0000 | INHALATION_SPRAY | Freq: Four times a day (QID) | RESPIRATORY_TRACT | 2 refills | Status: DC | PRN

## 2016-07-13 MED ORDER — ALBUTEROL SULFATE (2.5 MG/3ML) 0.083% IN NEBU: 2.5000 mg | INHALATION_SOLUTION | Freq: Four times a day (QID) | RESPIRATORY_TRACT | 12 refills | Status: DC | PRN

## 2016-07-13 MED ORDER — LEVOFLOXACIN 750 MG PO TABS
750.0000 mg | ORAL_TABLET | ORAL | Status: DC
  Administered 2016-07-13: 10:00:00 750 mg via ORAL
  Filled 2016-07-13: qty 1

## 2016-07-13 NOTE — Progress Notes (Signed)
Inpatient Diabetes Program Recommendations  AACE/ADA: New Consensus Statement on Inpatient Glycemic Control (2015)  Target Ranges:  Prepandial:   less than 140 mg/dL      Peak postprandial:   less than 180 mg/dL (1-2 hours)      Critically ill patients:  140 - 180 mg/dL   Lab Results  Component Value Date   GLUCAP 152 (H) 07/12/2016   HGBA1C 6.9 (H) 07/06/2016    Review of Glycemic Control  Results for Amy Calhoun, Keiona MAE (MRN [id number]) as of 07/13/2016 08:26  Ref. Range 03/31/2016 07:38 07/12/2016 11:10 07/12/2016 17:15 07/12/2016 20:35 07/13/2016 08:04  Glucose-Capillary Latest Ref Range: 65 - 99 mg/dL 409190 (H) 811255 (H) 914314 (H) 152 (H) 188 (H)    Diabetes history: Type 2 Outpatient Diabetes medications: Regular insulin 3 units breakfast, 6 units with lunch and supper, NPH 20 units qam and 10 units qpm, Tradgenta 5mg /day  Current orders for Inpatient glycemic control: Novolog insulin 3 units breakfast, 6 units with lunch and supper, Levemir 20 units qam and 10 units qpm, Tradgenta 5mg /day, Novolog 0-9 units tid   Inpatient Diabetes Program Recommendations:  Consider adding Novolog 0-5 units qhs  Susette RacerJulie Hong Timm, RN, [place], [place]HA, CDE Diabetes Coordinator Inpatient Diabetes Program  [phone] (Team Pager) [phone] [facility] Office) 07/13/2016 8:28 AM

## 2016-07-13 NOTE — Progress Notes (Signed)
PHARMACY NOTE:  ANTIMICROBIAL RENAL DOSAGE ADJUSTMENT  Current antimicrobial regimen includes a mismatch between antimicrobial dosage and estimated renal function.  As per policy approved by the Pharmacy & Therapeutics and Medical Executive Committees, the antimicrobial dosage will be adjusted accordingly.  Current antimicrobial dosage:  Levofloxacin 750mg  Q24hr.   Indication: CAP  Renal Function:  Estimated Creatinine Clearance: 43.3 mL/min (by C-G formula based on SCr of 0.98 mg/dL).  Antimicrobial dosage has been changed to: Levofloxacin 750mg  Q48hr.   Additional comments:   Thank you for allowing pharmacy to be a part of this patient's care.  Simpson,Michael L, RPH 07/13/2016 9:30 AM

## 2016-07-13 NOTE — Care Management CC44 (Signed)
Condition Code 44 Documentation Completed  Patient Details  Name: Melvenia BeamRosetta Mae Pocius MRN: [id number] Date of Birth: 07/21/1928   Condition Code 44 given:  Yes Patient signature on Condition Code 44 notice:  Yes Documentation of 2 MD's agreement:  Yes Code 44 added to claim:  Yes    Gwenette GreetBrenda S Deontae Robson, RN 07/13/2016, 10:43 AM

## 2016-07-13 NOTE — Progress Notes (Signed)
Discussed discharge instructions and medications with pt and her daughter. IV removed. All questions addressed. Pt transported home via car by her daughter.  [name], RN 

## 2016-07-13 NOTE — Telephone Encounter (Signed)
[facility]. Patient has been scheduled for HFU

## 2016-07-13 NOTE — Discharge Summary (Addendum)
[facility] [place] [facility]   PATIENT NAME: Amy Calhoun    MR#:  [id number]  DATE OF BIRTH:  May 11, 1928  DATE OF ADMISSION:  07/12/2016 ADMITTING PHYSICIAN: Altamese Dilling, MD  DATE OF DISCHARGE: 07/13/2016  PRIMARY CARE PHYSICIAN: Tommie Sams, DO    ADMISSION DIAGNOSIS:  Cough [R05] Community acquired pneumonia of left lower lobe of lung (HCC) [J18.1]  DISCHARGE DIAGNOSIS:  LL pneumonia  SECONDARY DIAGNOSIS:   Past Medical History:  Diagnosis Date  . Depression   . Esophageal dysphagia   . Gastric ulcer   . Hyperlipidemia   . Hypertension   . Hypothyroidism   . IDDM (insulin dependent diabetes mellitus) (HCC)   . Ischemic colitis (HCC)   . Osteoarthritis   . Ulcer of esophagus     HOSPITAL COURSE:  Amy Calhoun  is a 83 y.o. female with a known history of Hyperlipidemia, hypertension, hypothyroidism, insulin-dependent diabetes mellitus, ischemic colitis- lives in independent living facility and since yesterday afternoon after coming from her walking humid environment she is having shortness of breath.  * Ac respi failure with hypoxia transient in the setting of LLCommunity acquired pneumonia. Oral levaquin No fever, wbc normal Wean to room air    * Hypertension    continue home medications.  * Diabetes   We'll continue her home insulin doses and oral medication and keep on sliding scale coverage.  * Hypothyroidism   Continue levothyroxine.  * Hyperlipidemia   Continue statin.  * Chronic kidney disease stage III   Appears stable at baseline, continue monitoring.  Spoke with dter in the room. She would like pt to d/c today. Pt declined PT to see Pt has electric scooter and lifeline also at [facility] [place]  Pt will f/u dr [employer] as out pt  CONSULTS OBTAINED:    DRUG ALLERGIES:   Allergies  Allergen Reactions  . Pravastatin Other (See Comments)    States she refused due to side effects    DISCHARGE  MEDICATIONS:   Discharge Medication List as of 07/13/2016 10:35 AM    START taking these medications   Details  albuterol (PROVENTIL) (2.5 MG/3ML) 0.083% nebulizer solution Take 3 mLs (2.5 mg total) by nebulization every 6 (six) hours as needed for wheezing or shortness of breath., Starting Fri 07/13/2016, Normal    benzonatate (TESSALON) 100 MG capsule Take 1 capsule (100 mg total) by mouth 3 (three) times daily as needed for cough., Starting Fri 07/13/2016, Normal    levofloxacin (LEVAQUIN) 750 MG tablet Take 1 tablet (750 mg total) by mouth every other day., Starting Fri 07/13/2016, Normal      CONTINUE these medications which have NOT CHANGED   Details  acetaminophen-codeine (TYLENOL #3) 300-30 MG tablet Take 1 tablet by mouth every 8 (eight) hours as needed for moderate pain., Starting Fri 07/06/2016, Print    acidophilus (RISAQUAD) CAPS capsule Take 1 capsule by mouth daily., Historical Med    amitriptyline (ELAVIL) 75 MG tablet Take 2 tablets (150 mg total) by mouth at bedtime., Starting Mon 02/13/2016, No Print    Cholecalciferol (VITAMIN D) 2000 units CAPS Take 2,000 Units by mouth daily. , Historical Med    CRANBERRY PO Take 1 tablet by mouth every Monday, Wednesday, and Friday., Historical Med    docusate sodium (COLACE) 100 MG capsule Take 1 capsule (100 mg total) by mouth 2 (two) times daily., Starting Sat 03/31/2016, Normal    gabapentin (NEURONTIN) 100 MG capsule Take 2 capsules (200 mg  total) by mouth 3 (three) times daily., Starting Tue 03/27/2016, Normal    glucagon (GLUCAGON EMERGENCY) 1 MG injection Inject 1 mg into the vein once as needed. For hypoglycemic episodes. E11.42, Starting Tue 03/13/2016, Normal    insulin NPH Human (HUMULIN N,NOVOLIN N) 100 UNIT/ML injection 20 units every morning and 10 units at night, Normal    insulin regular (NOVOLIN R,HUMULIN R) 100 units/mL injection 3 units before breakfast , 6 units before lunch , 6 units before dinner Please dispense  90 day supply, Print    levothyroxine (SYNTHROID, LEVOTHROID) 150 MCG tablet Take 1 tablet (150 mcg total) by mouth daily., Starting Tue 04/03/2016, Normal    lidocaine (LIDODERM) 5 % PLACE 1 PATCH ON THE SKIN DAILY, Normal    linagliptin (TRADJENTA) 5 MG TABS tablet Take 1 tablet (5 mg total) by mouth daily., Starting Thu 04/05/2016, Normal    lisinopril (PRINIVIL,ZESTRIL) 10 MG tablet Take 1 tablet (10 mg total) by mouth daily., Starting Thu 01/19/2016, Normal    omeprazole (PRILOSEC) 40 MG capsule Take 1 capsule (40 mg total) by mouth daily., Starting Thu 01/19/2016, Normal    pyridOXINE (VITAMIN B-6) 100 MG tablet Take 100 mg by mouth daily., Historical Med    riboflavin (VITAMIN B-2) 100 MG TABS tablet Take 100 mg by mouth daily., Historical Med    simvastatin (ZOCOR) 40 MG tablet Take 40 mg by mouth at bedtime. , Historical Med    glucose blood (ONETOUCH VERIO) test strip USE AS INSTRUCTED to check sugars up to four times daily. E11.9, Normal    INSULIN SYRINGE 1CC/29G 29G X 1/2" 1 ML MISC As directed to administer insulin, Normal    polyethylene glycol (MIRALAX) packet Take 17 g by mouth daily., Starting Sat 03/31/2016, Print    senna-docusate (SENOKOT-S) 8.6-50 MG tablet Take 1 tablet by mouth at bedtime., Starting Sat 03/31/2016, Print        If you experience worsening of your admission symptoms, develop shortness of breath, life threatening emergency, suicidal or homicidal thoughts you must seek medical attention immediately by calling 911 or calling your MD immediately  if symptoms less severe.  You Must read complete instructions/literature along with all the possible adverse reactions/side effects for all the Medicines you take and that have been prescribed to you. Take any new Medicines after you have completely understood and accept all the possible adverse reactions/side effects.   Please note  You were cared for by a hospitalist during your hospital stay. If you have any  questions about your discharge medications or the care you received while you were in the hospital after you are discharged, you can call the unit and asked to speak with the hospitalist on call if the hospitalist that took care of you is not available. Once you are discharged, your primary care physician will handle any further medical issues. Please note that NO REFILLS for any discharge medications will be authorized once you are discharged, as it is imperative that you return to your primary care physician (or establish a relationship with a primary care physician if you do not have one) for your aftercare needs so that they can reassess your need for medications and monitor your lab values. Today   SUBJECTIVE   No new complaints. Feels weak but overall better than yday  VITAL SIGNS:  Blood pressure (!) 110/41, pulse 82, temperature 98 F (36.7 C), temperature source Oral, resp. rate 18, height 5\' 6"  (1.676 m), weight 74.6 kg (164 lb 8 oz),  SpO2 92 %.  I/O:    Intake/Output Summary (Last 24 hours) at 07/13/16 1302 Last data filed at 07/12/16 2200  Gross per 24 hour  Intake              286 ml  Output                0 ml  Net              286 ml    PHYSICAL EXAMINATION:  GENERAL:  83 y.o.-year-old patient lying in the bed with no acute distress.  EYES: Pupils equal, round, reactive to light and accommodation. No scleral icterus. Extraocular muscles intact.  HEENT: Head atraumatic, normocephalic. Oropharynx and nasopharynx clear.  NECK:  Supple, no jugular venous distention. No thyroid enlargement, no tenderness.  LUNGS: Normal breath sounds bilaterally, no wheezing, rales,rhonchi or crepitation. No use of accessory muscles of respiration.  CARDIOVASCULAR: S1, S2 normal. No murmurs, rubs, or gallops.  ABDOMEN: Soft, non-tender, non-distended. Bowel sounds present. No organomegaly or mass.  EXTREMITIES: No pedal edema, cyanosis, or clubbing.  NEUROLOGIC: Cranial nerves II through XII  are intact. Muscle strength 5/5 in all extremities. Sensation intact. Gait not checked.  PSYCHIATRIC: The patient is alert and oriented x 3.  SKIN: No obvious rash, lesion, or ulcer.   DATA REVIEW:   CBC   Recent Labs Lab 07/13/16 0445  WBC 8.8  HGB 10.7*  HCT 31.1*  PLT 186    Chemistries   Recent Labs Lab 07/13/16 0445  NA 136  K 4.1  CL 102  CO2 28  GLUCOSE 196*  BUN 26*  CREATININE 0.98  CALCIUM 8.3*    Microbiology Results   Recent Results (from the past 240 hour(s))  Blood culture (routine x 2)     Status: None (Preliminary result)   Collection Time: 07/12/16  1:05 PM  Result Value Ref Range Status   Specimen Description BLOOD LEFT ANTECUBITAL  Final   Special Requests   Final    BOTTLES DRAWN AEROBIC AND ANAEROBIC Blood Culture adequate volume   Culture NO GROWTH < 24 HOURS  Final   Report Status PENDING  Incomplete  Blood culture (routine x 2)     Status: None (Preliminary result)   Collection Time: 07/12/16  1:10 PM  Result Value Ref Range Status   Specimen Description BLOOD RIGHT ANTECUBITAL  Final   Special Requests   Final    BOTTLES DRAWN AEROBIC AND ANAEROBIC Blood Culture adequate volume   Culture NO GROWTH < 24 HOURS  Final   Report Status PENDING  Incomplete    RADIOLOGY:  Dg Chest 2 View  Result Date: 07/12/2016 CLINICAL DATA:  Shortness of breath. EXAM: CHEST  2 VIEW COMPARISON:  Chest x-ray 03/28/2016. FINDINGS: Mediastinum hilar structures normal. Heart size normal. Left lower lobe infiltrate consistent pneumonia. No pleural effusion or pneumothorax. Old left rib fractures . IMPRESSION: Left lower lobe infiltrate consistent with pneumonia . Electronically Signed   By: Maisie Fus  Register   On: 07/12/2016 09:09     Management plans discussed with the patient, family and they are in agreement.  CODE STATUS:     Code Status Orders        Start     Ordered   07/12/16 1609  Do not attempt resuscitation (DNR)  Continuous    Question  Answer Comment  In the event of cardiac or respiratory ARREST Do not call a "code blue"   In the event of cardiac  or respiratory ARREST Do not perform Intubation, CPR, defibrillation or ACLS   In the event of cardiac or respiratory ARREST Use medication by any route, position, wound care, and other measures to relive pain and suffering. May use oxygen, suction and manual treatment of airway obstruction as needed for comfort.      07/12/16 1608    Code Status History    Date Active Date Inactive Code Status Order ID Comments User Context   07/12/2016  3:23 PM 07/12/2016  4:08 PM Full Code [id number]  Altamese Dilling, MD ED   03/28/2016 11:56 PM 03/31/2016  4:19 PM Full Code [id number]  Hugelmeyer, Jon Gills, DO Inpatient      TOTAL TIME TAKING CARE OF THIS PATIENT: 40 minutes.    Ruvim Risko M.D on 07/13/2016 at 1:02 PM  Between 7am to 6pm - Pager - [phone] After 6pm go to [email or web address] - Social research officer, government  [facility] [place] [facility]  Office  [phone]  CC: Primary care physician; Tommie Sams, DO

## 2016-07-13 NOTE — Care Management CC44 (Signed)
Condition Code 44 Documentation Completed  Patient Details  Name: Melvenia BeamRosetta Mae Marohl MRN: [id number] Date of Birth: 09/06/1928   Condition Code 44 given:  Yes Patient signature on Condition Code 44 notice:  Yes Documentation of 2 MD's agreement:  Yes Code 44 added to claim:  Yes    Caren MacadamMichelle Colten Desroches, RN 07/13/2016, 10:51 AM

## 2016-07-16 NOTE — Telephone Encounter (Signed)
Transition Care Management Follow-up Telephone Call  How have you been since you were released from the hospital? The coughing is better, sugar low on Sunday 53. Given juice sugar cam back to normal within hour per patient daughter.   Do you understand why you were in the hospital? yes   Do you understand the discharge instrcutions? yes  Items Reviewed:  Medications reviewed:Yes  Allergies reviewed: yes  Dietary changes reviewed: Yes  Referrals reviewed: yes   Functional Questionnaire:   Activities of Daily Living (ADLs):   She states they are independent in the following: with assist patient can dress and bathe. States they require assistance with the following: daughter assists.   Any transportation issues/concerns?:No   Any patient concerns? No   Confirmed importance and date/time of follow-up visits scheduled: Yes   Confirmed with patient if condition begins to worsen call PCP or go to the ER.  Patient was given the Call-a-Nurse line [phone]: Yes

## 2016-07-17 ENCOUNTER — Ambulatory Visit (INDEPENDENT_AMBULATORY_CARE_PROVIDER_SITE_OTHER): Payer: Medicare HMO | Admitting: Family Medicine

## 2016-07-17 ENCOUNTER — Encounter: Payer: Self-pay | Admitting: Family Medicine

## 2016-07-17 ENCOUNTER — Telehealth (INDEPENDENT_AMBULATORY_CARE_PROVIDER_SITE_OTHER): Payer: Self-pay

## 2016-07-17 ENCOUNTER — Other Ambulatory Visit: Payer: Self-pay

## 2016-07-17 ENCOUNTER — Telehealth: Payer: Self-pay | Admitting: Sports Medicine

## 2016-07-17 DIAGNOSIS — J181 Lobar pneumonia, unspecified organism: Secondary | ICD-10-CM

## 2016-07-17 DIAGNOSIS — M4726 Other spondylosis with radiculopathy, lumbar region: Secondary | ICD-10-CM

## 2016-07-17 DIAGNOSIS — J9601 Acute respiratory failure with hypoxia: Secondary | ICD-10-CM

## 2016-07-17 DIAGNOSIS — J189 Pneumonia, unspecified organism: Secondary | ICD-10-CM

## 2016-07-17 LAB — CULTURE, BLOOD (ROUTINE X 2)
Culture: NO GROWTH
Culture: NO GROWTH
SPECIAL REQUESTS: ADEQUATE
SPECIAL REQUESTS: ADEQUATE

## 2016-07-17 NOTE — Assessment & Plan Note (Signed)
Now resolved. In setting of LLL Pneumonia. Medications reviewed and reconciled. She is to finish her course of Levaquin

## 2016-07-17 NOTE — Telephone Encounter (Signed)
Pt is requesting a call back please regarding the treatment plan

## 2016-07-17 NOTE — Telephone Encounter (Signed)
spinal injection did not help at all for patient, stated that it didn't even last for 24hrs. Should pt come back sooner than her 08/21/16 appointment to discuss further treatment?

## 2016-07-17 NOTE — Assessment & Plan Note (Signed)
Improving. Finish levaquin.

## 2016-07-17 NOTE — Telephone Encounter (Signed)
Spoke with daughter [name], [place], cancel appointment with Dr. Berline Choughigby for this coming Thursday and proceed with referral. [name] would like to be contacted Thursday regarding referral status.

## 2016-07-17 NOTE — Telephone Encounter (Signed)
Patient's daughter called stating that spinal injection did not help at all for patient, stated that it didn't even last for 24hrs. Just wanted to let Dr. Alvester MorinNewton know and would like a call back when he returns.  Cb# is [phone]

## 2016-07-17 NOTE — Progress Notes (Signed)
Subjective:  Patient ID: Amy Calhoun, female    DOB: 28-Aug-1928  Age: 83 y.o. MRN: [id number]  CC: Hospital follow up  HPI:  83 year old female with an extensive past medical history including DM 2, hypertension, esophageal dysmotility, osteoarthritis of the low back with radiculopathy, hyperlipidemia, chronic pain presents for hospital follow up.  Patient recently admitted from 6/28-6/29. Hospitalization reviewed and is summarized as follows: Patient presented with shortness of breath. Found to have acute hypoxic respiratory failure in the setting of left lower lobe pneumonia. She was treated with antibiotics and weaned to room air. She did well during admission and was discharged home in stable condition.  Patient presents today for hospital follow-up. She continues to have some cough, shortness of breath, and wheezing. She is still taking her antibiotic as prescribed. Daughter is concerned about potential aspiration given her history of esophageal dysmotility. I encouraged her to consider seeing GI again in the near future. Patient's primary complaints are centered upon pain. She continues to have low back pain and hip pain. This is severe. She has had little relief with injection treatment. They would like to discuss this today.  Social Hx   Social History   Social History  . Marital status: Widowed    Spouse name: N/A  . Number of children: N/A  . Years of education: N/A   Occupational History  . retired    Social History Main Topics  . Smoking status: Never Smoker  . Smokeless tobacco: Never Used  . Alcohol use No  . Drug use: No  . Sexual activity: No   Other Topics Concern  . None   Social History Narrative  . None    Review of Systems  Constitutional: Negative for fever.  Respiratory: Positive for cough, shortness of breath and wheezing.   Musculoskeletal: Positive for arthralgias and back pain.   Objective:  BP (!) 124/58 (BP Location: Left Arm, Patient  Position: Sitting, Cuff Size: Normal)   Temp 97.7 F (36.5 C) (Oral)   Resp 16   Wt 164 lb 2 oz (74.4 kg)   SpO2 92%   BMI 26.49 kg/m   BP/Weight 07/17/2016 07/13/2016 07/12/2016  Systolic BP 124 110 -  Diastolic BP 58 41 -  Wt. (Lbs) 164.13 - 164.5  BMI 26.49 - 26.55    Physical Exam  Constitutional: She appears well-developed. No distress.  Cardiovascular: Normal rate and regular rhythm.   Pulmonary/Chest: Effort normal.  Crackles noted.  Neurological: She is alert.  Psychiatric:  Patient tearful when discussing her pain.  Vitals reviewed.   Lab Results  Component Value Date   WBC 8.8 07/13/2016   HGB 10.7 (L) 07/13/2016   HCT 31.1 (L) 07/13/2016   PLT 186 07/13/2016   GLUCOSE 196 (H) 07/13/2016   CHOL 114 03/29/2016   TRIG 100 03/29/2016   HDL 50 03/29/2016   LDLCALC 44 03/29/2016   ALT 12 07/06/2016   AST 14 07/06/2016   NA 136 07/13/2016   K 4.1 07/13/2016   CL 102 07/13/2016   CREATININE 0.98 07/13/2016   BUN 26 (H) 07/13/2016   CO2 28 07/13/2016   TSH 1.22 07/06/2016   HGBA1C 6.9 (H) 07/06/2016    Assessment & Plan:   Problem List Items Addressed This Visit      Respiratory   Acute respiratory failure with hypoxemia (HCC)    Now resolved. In setting of LLL Pneumonia. Medications reviewed and reconciled. She is to finish her course of Levaquin  Community acquired pneumonia    Improving. Finish levaquin.        Nervous and Auditory   Osteoarthritis of spine with radiculopathy, lumbar region    I advised him to consider neurosurgery referral as directed by her sports medicine physician. I did not recommend continued injections as she has not had any improvement. I informed her that she may need to see pain management in the near future as well.        Follow-up: As scheduled  Everlene OtherJayce Samatha Anspach DO [facility] [place]

## 2016-07-17 NOTE — Assessment & Plan Note (Signed)
I advised him to consider neurosurgery referral as directed by her sports medicine physician. I did not recommend continued injections as she has not had any improvement. I informed her that she may need to see pain management in the near future as well.

## 2016-07-17 NOTE — Patient Instructions (Signed)
Continue your medications.  Consider seeing neurosurgery or pain management.  Follow up in 3 months.  Take care  Dr. Adriana Simasook

## 2016-07-17 NOTE — Telephone Encounter (Signed)
Pt has a pending appt on 07/19/2016.

## 2016-07-17 NOTE — Telephone Encounter (Signed)
I am happy to see her back sooner if they would like alternatively if they would prefer we can refer them to neurosurgery to have them weigh in on potential interventions/surgery

## 2016-07-19 ENCOUNTER — Ambulatory Visit: Payer: Medicare HMO | Admitting: Sports Medicine

## 2016-07-19 ENCOUNTER — Ambulatory Visit: Payer: Medicare HMO | Admitting: Family Medicine

## 2016-07-19 NOTE — Telephone Encounter (Signed)
Patients daughter is requesting a PA for this medication  Pt contact  [name] [phone]

## 2016-07-19 NOTE — Telephone Encounter (Signed)
Form in your folder for completion 

## 2016-07-19 NOTE — Telephone Encounter (Signed)
Prior authorization has been started 

## 2016-07-19 NOTE — Telephone Encounter (Signed)
Called [facility] and was advised that referral was received and pt will be contacted in about 1 week.

## 2016-07-20 ENCOUNTER — Telehealth: Payer: Self-pay

## 2016-07-20 NOTE — Telephone Encounter (Signed)
Prior authorization case number [id number] approved for [id number] for 06/20/16-7//2019.  Notified [name] daughter approved.

## 2016-07-23 ENCOUNTER — Telehealth: Payer: Self-pay | Admitting: Sports Medicine

## 2016-07-23 NOTE — Telephone Encounter (Signed)
Amy Calhoun is calling to retrieve a disc of the 03/20/2016 xray. This is for the patients appt tomorrow. Please call her mobile #

## 2016-07-24 ENCOUNTER — Other Ambulatory Visit: Payer: Medicare HMO

## 2016-07-24 DIAGNOSIS — M48062 Spinal stenosis, lumbar region with neurogenic claudication: Secondary | ICD-10-CM | POA: Diagnosis not present

## 2016-07-25 NOTE — Telephone Encounter (Signed)
07/25/16 Received CBG log from patients daughter.   Daughter states she received a spinal injection of steroid on 07/10/16.   Daughter denies other episodes of hypoglycemia.  07/06/16 A1C 6.9%    A/P Continue current insulin regimen (NPH 20 units QAM and 10 units QPM + Insulin Regular 3 units before breakfast and 6 units before lunch and supper).  May need to consider decreasing insulin doses in the future given A1C.    Hazle NordmannKelsy Combs, PharmD, BCPS [facility] PGY2 Pharmacy Resident [phone]

## 2016-07-26 ENCOUNTER — Ambulatory Visit (INDEPENDENT_AMBULATORY_CARE_PROVIDER_SITE_OTHER): Payer: Medicare HMO | Admitting: Physician Assistant

## 2016-07-26 ENCOUNTER — Encounter: Payer: Self-pay | Admitting: Physician Assistant

## 2016-07-26 VITALS — BP 112/56 | HR 76 | Ht 64.0 in | Wt 158.8 lb

## 2016-07-26 DIAGNOSIS — K224 Dyskinesia of esophagus: Secondary | ICD-10-CM

## 2016-07-26 DIAGNOSIS — K219 Gastro-esophageal reflux disease without esophagitis: Secondary | ICD-10-CM

## 2016-07-26 DIAGNOSIS — R1319 Other dysphagia: Secondary | ICD-10-CM

## 2016-07-26 NOTE — Progress Notes (Signed)
I agree with the above note, plan 

## 2016-07-26 NOTE — Progress Notes (Signed)
Chief Complaint: Dysphagia  HPI:  Amy Calhoun is an 83 year old Caucasian female with a past medical history of depression, dysphagia, hyperlipidemia, hypertension, ischemic colitis noticed below, who was originally seen in clinic on 12/21/15 by Doug SouJessica Zehr, PA-C and assigned to Dr. Christella HartiganJacobs, she returns to clinic today for increased complaints of dysphagia.   Please recall when patient was last seen, it was noted that she had a history of dysphagia and a recent dilation, records were requested at that time from [place]. These were received and it appeared that her last endoscopy was performed in August 2017 during a food impaction, there was no stricture at that time but in October she had an esophagram which showed dysmotility and mention of a smooth narrowing of the distal part of the esophagus. Patient was called in [place] this year but described having no further issues with dysphagia and so intervention was not scheduled.   Today, the patient is accompanied by her daughter who provides most of her history due to patient's impaired short-term memory. She explains that "her swallowing goes in spurts". She explains that sometimes weeks at a time the patient will have trouble with anything that she puts her mouth including liquids or solids, and sometimes she is okay. They have found that if she eats soft foods and tries to take her pills one at a time and drinks warm liquids while eating this tends to help her. Overall everything has gotten worse and " we are ready for something to happen again". Patient does continue on Omeprazole 40 mg daily and denies any heartburn or reflux symptoms.   Patient's medical history is positive for being evaluated to have spinal surgery in the near future per her daughter. Her daughters worried about this in regards to worsening dysphagia after general anesthesia.   Patient denies fever, chills, blood in her stool, melena, change in bowel habits, weight loss, anorexia,  nausea, vomiting or symptoms that awaken her at night.      Past Medical History:  Diagnosis Date  . Depression   . Esophageal dysphagia   . Gastric ulcer   . Hyperlipidemia   . Hypertension   . Hypothyroidism   . IDDM (insulin dependent diabetes mellitus) (HCC)   . Ischemic colitis (HCC)   . Osteoarthritis   . Ulcer of esophagus     Past Surgical History:  Procedure Laterality Date  . ABDOMINAL HYSTERECTOMY    . GALLBLADDER SURGERY    . THYROIDECTOMY      Current Outpatient Prescriptions  Medication Sig Dispense Refill  . acetaminophen-codeine (TYLENOL #3) 300-30 MG tablet Take 1 tablet by mouth every 8 (eight) hours as needed for moderate pain. 30 tablet 0  . acidophilus (RISAQUAD) CAPS capsule Take 1 capsule by mouth daily.    Marland Kitchen. albuterol (PROVENTIL HFA;VENTOLIN HFA) 108 (90 Base) MCG/ACT inhaler Inhale 2 puffs into the lungs every 6 (six) hours as needed for wheezing or shortness of breath. 1 Inhaler 2  . albuterol (PROVENTIL) (2.5 MG/3ML) 0.083% nebulizer solution Take 3 mLs (2.5 mg total) by nebulization every 6 (six) hours as needed for wheezing or shortness of breath. 75 mL 12  . amitriptyline (ELAVIL) 75 MG tablet TAKE 2 TABLETS AT BEDTIME 90 tablet 0  . Cholecalciferol (VITAMIN D) 2000 units CAPS Take 2,000 Units by mouth daily.     Marland Kitchen. CRANBERRY PO Take 1 tablet by mouth every Monday, Wednesday, and Friday.    . docusate sodium (COLACE) 100 MG capsule Take 1 capsule (  100 mg total) by mouth 2 (two) times daily. 10 capsule 0  . gabapentin (NEURONTIN) 100 MG capsule Take 2 capsules (200 mg total) by mouth 3 (three) times daily. 540 capsule 1  . glucagon (GLUCAGON EMERGENCY) 1 MG injection Inject 1 mg into the vein once as needed. For hypoglycemic episodes. E11.42 1 each 3  . glucose blood (ONETOUCH VERIO) test strip USE AS INSTRUCTED to check sugars up to four times daily. E11.9 200 each 11  . insulin NPH Human (HUMULIN N,NOVOLIN N) 100 UNIT/ML injection 20 units every  morning and 10 units at night 30 mL 1  . insulin regular (NOVOLIN R,HUMULIN R) 100 units/mL injection 3 units before breakfast , 6 units before lunch , 6 units before dinner Please dispense 90 day supply 10 mL 0  . INSULIN SYRINGE 1CC/29G 29G X 1/2" 1 ML MISC As directed to administer insulin 200 each 11  . levothyroxine (SYNTHROID, LEVOTHROID) 150 MCG tablet Take 1 tablet (150 mcg total) by mouth daily. 90 tablet 3  . lidocaine (LIDODERM) 5 % PLACE 1 PATCH ON THE SKIN DAILY 90 patch 1  . linagliptin (TRADJENTA) 5 MG TABS tablet Take 1 tablet (5 mg total) by mouth daily. 90 tablet 1  . lisinopril (PRINIVIL,ZESTRIL) 10 MG tablet Take 1 tablet (10 mg total) by mouth daily. 90 tablet 3  . omeprazole (PRILOSEC) 40 MG capsule Take 1 capsule (40 mg total) by mouth daily. 90 capsule 3  . polyethylene glycol (MIRALAX) packet Take 17 g by mouth daily. 14 each 0  . pyridOXINE (VITAMIN B-6) 100 MG tablet Take 100 mg by mouth daily.    . riboflavin (VITAMIN B-2) 100 MG TABS tablet Take 100 mg by mouth daily.    Marland Kitchen senna-docusate (SENOKOT-S) 8.6-50 MG tablet Take 1 tablet by mouth at bedtime. 30 tablet 5  . simvastatin (ZOCOR) 40 MG tablet Take 40 mg by mouth at bedtime.      No current facility-administered medications for this visit.     Allergies as of 07/26/2016 - Review Complete 07/26/2016  Allergen Reaction Noted  . Pravastatin Other (See Comments) 12/21/2015    Family History  Problem Relation Age of Onset  . Diabetes Mother   . Arthritis Mother   . Hyperlipidemia Mother   . Mental illness Mother   . Heart disease Father   . Mental illness Sister   . Arthritis Maternal Grandmother   . Arthritis Maternal Grandfather   . Colon cancer Neg Hx   . Stomach cancer Neg Hx   . Esophageal cancer Neg Hx     Social History   Social History  . Marital status: Widowed    Spouse name: N/A  . Number of children: N/A  . Years of education: N/A   Occupational History  . retired    Social  History Main Topics  . Smoking status: Never Smoker  . Smokeless tobacco: Never Used  . Alcohol use No  . Drug use: No  . Sexual activity: No   Other Topics Concern  . Not on file   Social History Narrative  . No narrative on file    Review of Systems:    Constitutional: No weight loss, fever or chills Cardiovascular: No chest pain Respiratory: No SOB  Gastrointestinal: See HPI and otherwise negative   Physical Exam:  Vital signs: BP (!) 112/56   Pulse 76   Ht 5\' 4"  (1.626 m)   Wt 158 lb 12.8 oz (72 kg)   BMI 27.26  kg/m   Constitutional:   Pleasant elderly Caucasian female appears to be in NAD, Well developed, Well nourished, alert and cooperative Respiratory: Respirations even and unlabored. Lungs clear to auscultation bilaterally.   No wheezes, crackles, or rhonchi.  Cardiovascular: Normal S1, S2. No MRG. Regular rate and rhythm. No peripheral edema, cyanosis or pallor.  Gastrointestinal:  Soft, nondistended, nontender. No rebound or guarding. Normal bowel sounds. No appreciable masses or hepatomegaly. Neurologic:  Alert and  oriented x4;  grossly normal neurologically. Impaired short term memory Psychiatric: Demonstrates good judgement and reason without abnormal affect or behaviors.  RELEVANT LABS AND IMAGING: CBC    Component Value Date/Time   WBC 8.8 07/13/2016 0445   RBC 3.54 (L) 07/13/2016 0445   HGB 10.7 (L) 07/13/2016 0445   HCT 31.1 (L) 07/13/2016 0445   PLT 186 07/13/2016 0445   MCV 87.8 07/13/2016 0445   MCH 30.3 07/13/2016 0445   MCHC 34.5 07/13/2016 0445   RDW 13.7 07/13/2016 0445   LYMPHSABS 2.3 03/01/2016 1019   MONOABS 1.1 (H) 03/01/2016 1019   EOSABS 0.1 03/01/2016 1019   BASOSABS 0.1 03/01/2016 1019    CMP     Component Value Date/Time   NA 136 07/13/2016 0445   NA 141 03/31/2015   K 4.1 07/13/2016 0445   CL 102 07/13/2016 0445   CO2 28 07/13/2016 0445   GLUCOSE 196 (H) 07/13/2016 0445   BUN 26 (H) 07/13/2016 0445   BUN 25 (A)  03/31/2015   CREATININE 0.98 07/13/2016 0445   CALCIUM 8.3 (L) 07/13/2016 0445   PROT 6.7 07/06/2016 0924   ALBUMIN 3.9 07/06/2016 0924   AST 14 07/06/2016 0924   ALT 12 07/06/2016 0924   ALKPHOS 69 07/06/2016 0924   BILITOT 0.3 07/06/2016 0924   GFRNONAA 51 (L) 07/13/2016 0445   GFRAA 59 (L) 07/13/2016 0445    Assessment: 1. Dysphagia: Patient was initially seen in December, had moved from [place], records were received which showed last endoscopy in August 2017 for a food impaction which was removed but they did not see a stricture that time, patient had esophagram in October which showed dysmotility and mention of a smooth narrowing at the distal part of the esophagus, now with increase in symptoms towards sometimes even water, would recommend EGD with dilation for likely worsening stricture, patient may also have an element of dysmotility which is worse with certain foods and may benefit from speech pathology eval  Plan: 1. Scheduled patient for an EGD with dilation with Dr. Christella Hartigan in the [facility]. Did discuss risks, benefits, limitations and alternatives and the patient agrees to proceed. 2. Reviewed anti-dysphagia measures including taking small bites, drinking sips of water in between bites, avoiding distraction while eating, chewing well and the chin tuck technique 3. Continue Omeprazole 40 mg once daily, 30-60 minutes before breakfast. 4. Discussed with the patient and her daughter that likely she will need speech path eval in the future due to her known history of dysmotility 5. Patient to follow in clinic per recommendations from Dr. Christella Hartigan after time of procedure.  Hyacinth Meeker, PA-C [facility] 07/26/2016, 12:15 PM  Cc: Tommie Sams, DO

## 2016-07-26 NOTE — Patient Instructions (Signed)
You have been scheduled for an endoscopy. Please follow written instructions given to you at your visit today. If you use inhalers (even only as needed), please bring them with you on the day of your procedure. Your physician has requested that you go to www.startemmi.com and enter the access code given to you at your visit today. This web site gives a general overview about your procedure. However, you should still follow specific instructions given to you by our office regarding your preparation for the procedure.  Continue Omeprazole 40 mg daily.   If you are age 165 or older, your body mass index should be between 23-30. Your Body mass index is 27.26 kg/m. If this is out of the aforementioned range listed, please consider follow up with your Primary Care Provider.  If you are age 81 or younger, your body mass index should be between 19-25. Your Body mass index is 27.26 kg/m. If this is out of the aformentioned range listed, please consider follow up with your Primary Care Provider.

## 2016-07-30 ENCOUNTER — Ambulatory Visit (AMBULATORY_SURGERY_CENTER): Payer: Medicare HMO | Admitting: Gastroenterology

## 2016-07-30 ENCOUNTER — Encounter: Payer: Self-pay | Admitting: Gastroenterology

## 2016-07-30 ENCOUNTER — Other Ambulatory Visit: Payer: Self-pay | Admitting: Neurosurgery

## 2016-07-30 ENCOUNTER — Telehealth: Payer: Self-pay

## 2016-07-30 VITALS — BP 173/80 | HR 76 | Temp 97.3°F | Resp 14 | Ht 64.0 in | Wt 158.0 lb

## 2016-07-30 DIAGNOSIS — K222 Esophageal obstruction: Secondary | ICD-10-CM | POA: Diagnosis not present

## 2016-07-30 DIAGNOSIS — K449 Diaphragmatic hernia without obstruction or gangrene: Secondary | ICD-10-CM

## 2016-07-30 DIAGNOSIS — R1310 Dysphagia, unspecified: Secondary | ICD-10-CM | POA: Diagnosis not present

## 2016-07-30 MED ORDER — SODIUM CHLORIDE 0.9 % IV SOLN: 500.0000 mL | INTRAVENOUS | Status: AC

## 2016-07-30 NOTE — Op Note (Signed)
[facility] Patient Name: Amy Calhoun Procedure Date: 07/30/2016 9:19 AM MRN: [id number] Endoscopist: Rachael Fee , MD Age: 83 Referring MD:  Date of Birth: 1928/07/23 Gender: Female Account #: [id number] Procedure:                Upper GI endoscopy Indications:              Dysphagia, weight gain. Medicines:                Monitored Anesthesia Care Procedure:                Pre-Anesthesia Assessment:                           - Prior to the procedure, a History and Physical                            was performed, and patient medications and                            allergies were reviewed. The patient's tolerance of                            previous anesthesia was also reviewed. The risks                            and benefits of the procedure and the sedation                            options and risks were discussed with the patient.                            All questions were answered, and informed consent                            was obtained. Prior Anticoagulants: The patient has                            taken no previous anticoagulant or antiplatelet                            agents. ASA Grade Assessment: III - A patient with                            severe systemic disease. After reviewing the risks                            and benefits, the patient was deemed in                            satisfactory condition to undergo the procedure.                           After obtaining informed consent, the endoscope was  passed under direct vision. Throughout the                            procedure, the patient's blood pressure, pulse, and                            oxygen saturations were monitored continuously. The                            Endoscope was introduced through the mouth, and                            advanced to the second part of duodenum. The upper                            GI endoscopy was accomplished  without difficulty.                            The patient tolerated the procedure well. Scope In: Scope Out: Findings:                 One mild benign-appearing, intrinsic stenosis was                            found at the gastroesophageal junction. The                            esophagus was also quite tortuous, especially                            distally, and there was a small hiatal hernia                            present. Dilation with an 18-19-20 mm balloon                            dilator was performed to 20 mm. There was the                            typical superficial mucosal tear and self limited                            oozing of blood following dilation.                           A small hiatal hernia was present.                           The examined duodenum was normal. Complications:            No immediate complications. Estimated blood loss:                            None. Estimated Blood Loss:     Estimated blood loss: none. Impression:               -  Benign-appearing esophageal stenosis (related to                            tortuous, slightly foreshortened esophagus?). This                            was dilated to 20mm.                           - Small hiatal hernia.                           - Normal examined duodenum. Recommendation:           - Patient has a contact number available for                            emergencies. The signs and symptoms of potential                            delayed complications were discussed with the                            patient. Return to normal activities tomorrow.                            Written discharge instructions were provided to the                            patient.                           - Resume previous diet. Continue to focus on taking                            small bites, chewing well and eating your meals                            slowly.                           - Continue  present medications.                           - Repeat upper endoscopy PRN for retreatment. Rachael Feeaniel P Ladasia Sircy, MD 07/30/2016 9:39:49 AM This report has been signed electronically.

## 2016-07-30 NOTE — Progress Notes (Signed)
A/ox3, pleased with MAC, report to RN 

## 2016-07-30 NOTE — Patient Instructions (Signed)
Impression/Recommendations:  Hiatal hernia handout given to patient. Dilation diet handout given to patient. Resume previous diet.  Continue to focus on taking small bites, chewing well and eating your meals slowly. Continue present medications.  Repeat upper endoscopy as needed for retreatment.  YOU HAD AN ENDOSCOPIC PROCEDURE TODAY AT THE [facility]:   Refer to the procedure report that was given to you for any specific questions about what was found during the examination.  If the procedure report does not answer your questions, please call your gastroenterologist to clarify.  If you requested that your care partner not be given the details of your procedure findings, then the procedure report has been included in a sealed envelope for you to review at your convenience later.  YOU SHOULD EXPECT: Some feelings of bloating in the abdomen. Passage of more gas than usual.  Walking can help get rid of the air that was put into your GI tract during the procedure and reduce the bloating. If you had a lower endoscopy (such as a colonoscopy or flexible sigmoidoscopy) you may notice spotting of blood in your stool or on the toilet paper. If you underwent a bowel prep for your procedure, you may not have a normal bowel movement for a few days.  Please Note:  You might notice some irritation and congestion in your nose or some drainage.  This is from the oxygen used during your procedure.  There is no need for concern and it should clear up in a day or so.  SYMPTOMS TO REPORT IMMEDIATELY:  Following upper endoscopy (EGD)  Vomiting of blood or coffee ground material  New chest pain or pain under the shoulder blades  Painful or persistently difficult swallowing  New shortness of breath  Fever of 100F or higher  Black, tarry-looking stools  For urgent or emergent issues, a gastroenterologist can be reached at any hour by calling [phone].   DIET:  We do recommend a small meal at  first, but then you may proceed to your regular diet.  Drink plenty of fluids but you should avoid alcoholic beverages for 24 hours.  ACTIVITY:  You should plan to take it easy for the rest of today and you should NOT DRIVE or use heavy machinery until tomorrow (because of the sedation medicines used during the test).    FOLLOW UP: Our staff will call the number listed on your records the next business day following your procedure to check on you and address any questions or concerns that you may have regarding the information given to you following your procedure. If we do not reach you, we will leave a message.  However, if you are feeling well and you are not experiencing any problems, there is no need to return our call.  We will assume that you have returned to your regular daily activities without incident.  If any biopsies were taken you will be contacted by phone or by letter within the next 1-3 weeks.  Please call us at [phone] if you have not heard about the biopsies in 3 weeks.    SIGNATURES/CONFIDENTIALITY: You and/or your care partner have signed paperwork which will be entered into your electronic medical record.  These signatures attest to the fact that that the information above on your After Visit Summary has been reviewed and is understood.  Full responsibility of the confidentiality of this discharge information lies with you and/or your care-partner.

## 2016-07-30 NOTE — Progress Notes (Signed)
Pt's states no medical or surgical changes since previsit or office visit. 

## 2016-07-30 NOTE — Telephone Encounter (Signed)
Amy Calhoun calling because her mom is in a lot of pain with her hip and back. She is out of her Tylenol #3. She went to the neurosurgeon and they are going to do surgery on the 27 th. Amy Calhoun is calling Dr. Berline Choughigby because she is having a lot of breakthrough pain. She would like to know if Dr. Berline Choughigby can give her 10-15 Tylenol #30. She is giving her Ibuprofen and its not helping. She only need something to last until the 27 th. She asked the neurosurgeon to prescribe something and she was advised to contact Dr. Berline Choughigby. Amy Calhoun is aware that Dr. Berline Choughigby is out of the office today. She is requesting that this be addressed first thing in the morning because her mom is in a lot of pain and her other providers keep telling Amy Calhoun to ask Dr. Berline Choughigby. Will forward to Dr. Berline Choughigby and follow-up with him in the morning.

## 2016-07-30 NOTE — Progress Notes (Signed)
Called to room to assist during endoscopic procedure.  Patient ID and intended procedure confirmed with present staff. Received instructions for my participation in the procedure from the performing physician.  

## 2016-07-31 ENCOUNTER — Other Ambulatory Visit: Payer: Self-pay

## 2016-07-31 ENCOUNTER — Telehealth: Payer: Self-pay | Admitting: *Deleted

## 2016-07-31 DIAGNOSIS — M25552 Pain in left hip: Secondary | ICD-10-CM

## 2016-07-31 DIAGNOSIS — M4726 Other spondylosis with radiculopathy, lumbar region: Secondary | ICD-10-CM

## 2016-07-31 MED ORDER — ACETAMINOPHEN-CODEINE #3 300-30 MG PO TABS: 1.0000 | ORAL_TABLET | Freq: Three times a day (TID) | ORAL | 0 refills | Status: DC | PRN

## 2016-07-31 NOTE — Telephone Encounter (Signed)
Left message on f/u call 

## 2016-07-31 NOTE — Telephone Encounter (Signed)
Patient's grandaughter Amy Calhoun calling b/c script for tylenol 3 was sent to wrong pharmacy.  [facility] [id number] [place] - [address]  Thank you,  -[place]

## 2016-07-31 NOTE — Telephone Encounter (Signed)
Advised grandaughter that [facility] [place]. She will go pick it up.  Thank you,  -LL

## 2016-07-31 NOTE — Telephone Encounter (Signed)
Rx called in to pharmacy. 

## 2016-07-31 NOTE — Telephone Encounter (Signed)
Called and left VM for [name] advising.

## 2016-07-31 NOTE — Telephone Encounter (Signed)
  Follow up Call-  Call back number 07/30/2016  Post procedure Call Back phone  # [phone]  Permission to leave phone message Yes  Some recent data might be hidden     Patient questions:  Do you have a fever, pain , or abdominal swelling? No. Pain Score  0 *  Have you tolerated food without any problems? Yes.    Have you been able to return to your normal activities? Yes.    Do you have any questions about your discharge instructions: Diet   No. Medications  No. Follow up visit  No.  Do you have questions or concerns about your Care? No.  Actions: * If pain score is 4 or above: No action needed, pain <4.

## 2016-07-31 NOTE — Telephone Encounter (Signed)
Okay to fill #10 of Tylenol #3

## 2016-07-31 NOTE — Telephone Encounter (Signed)
[facility] and [facility] confirmed that [facility] are getting [facility] ready.

## 2016-08-01 NOTE — Telephone Encounter (Signed)
Cherylann RatelJanis states to call her sister [name] who does [facility], but yes [place] [phone]    Patient no longer has [place] PCP    Vicki MalletSara Egypt Marchiano, PharmD, [facility]  [facility] Clinical Pharmacist  Direct: [phone]  Toll Free: [phone], Option 7

## 2016-08-01 NOTE — Telephone Encounter (Signed)
CLINICAL PHARMACY NOTE  Post-Discharge Transitions of Care (TOC)    Patient was discharged from [facility] on 07/13/16.    Attempted to reach patient to review medications; left message asking for return call.  Will continue to attempt to contact as appropriate.      Vicki MalletSara Taner Rzepka, PharmD, BCPS  [facility] Clinical Pharmacist  Direct: [phone]  Toll Free: [phone], Option 7      =======================================================   CLINICAL PHARMACY NOTE   POST-DISCHARGE TELEPHONE FOLLOW-UP ADDENDUM    For Pharmacy Admin Tracking Only    TCM Call Made?: No  [facility] Patient?: Yes    Time Spent (min): 5

## 2016-08-04 NOTE — Assessment & Plan Note (Signed)
Given her ability to ambulate significant pain since she has an for neurosurgical evaluation warranted.>50% of this 25 minute visit spent in direct patient counseling and/or coordination of care.  Discussion was focused on education regarding the in discussing the pathoetiology and anticipated clinical course of the above condition.  Focus of discussion was around treatment options including continued conservative measures and operative intervention.  Ultimately they would like for surgical opinion.

## 2016-08-06 ENCOUNTER — Telehealth: Payer: Self-pay | Admitting: Sports Medicine

## 2016-08-06 ENCOUNTER — Inpatient Hospital Stay (HOSPITAL_COMMUNITY): Admission: RE | Admit: 2016-08-06 | Payer: Medicare HMO | Source: Ambulatory Visit

## 2016-08-06 ENCOUNTER — Telehealth (INDEPENDENT_AMBULATORY_CARE_PROVIDER_SITE_OTHER): Payer: Self-pay | Admitting: *Deleted

## 2016-08-06 ENCOUNTER — Other Ambulatory Visit: Payer: Self-pay

## 2016-08-06 DIAGNOSIS — M25552 Pain in left hip: Secondary | ICD-10-CM

## 2016-08-06 DIAGNOSIS — M4726 Other spondylosis with radiculopathy, lumbar region: Secondary | ICD-10-CM

## 2016-08-06 MED ORDER — ACETAMINOPHEN-CODEINE #3 300-30 MG PO TABS: 1.0000 | ORAL_TABLET | Freq: Four times a day (QID) | ORAL | 0 refills | Status: DC | PRN

## 2016-08-06 NOTE — Telephone Encounter (Signed)
Patient's granddaughter called in reference to patient being in pain. Patient has surgery Friday,can't have any anti inflammatory's due to surgery. Has one more of the Tylenol 3 left.  acetaminophen-codeine (TYLENOL #3) 300-30 MG tablet  [id number] [place] [place], [place] - [address] [phone] (Phone) [phone] (Fax)   Please contact patient's granddaughter and advise. OK to leave message if no answer.

## 2016-08-06 NOTE — Pre-Procedure Instructions (Signed)
Amy Calhoun  08/06/2016      [facility] - [place], [place] - [address] [place] [address] Phone: [phone] Fax: [phone]  [facility] [phone] [place], [place] - [address] [place] [address] Phone: [phone] Fax: [phone]    Your procedure is scheduled on August 10, 2016.  Report to [facility] Admitting at 530 AM.  Call this number if you have problems the morning of surgery:  [phone]   Remember:  Do not eat food or drink liquids after midnight.  Take these medicines the morning of surgery with A SIP OF WATER tylenol #3-if needed, albuterol inhaler-if needed <bring inhaler with you>, amitriptyline (elavil), docusate sodium (colace), gabapentin (neurontin), levothyroxine (synthroid), omeprazole (prilosec).  7 days prior to surgery STOP taking any Aspirin, Aleve, Naproxen, Ibuprofen, Motrin, Advil, Goody's, BC's, all herbal medications, fish oil, and all vitamins     How to Manage Your Diabetes Before and After Surgery  Why is it important to control my blood sugar before and after surgery? . Improving blood sugar levels before and after surgery helps healing and can limit problems. . A way of improving blood sugar control is eating a healthy diet by: o  Eating less sugar and carbohydrates o  Increasing activity/exercise o  Talking with your doctor about reaching your blood sugar goals . High blood sugars (greater than 180 mg/dL) can raise your risk of infections and slow your recovery, so you will need to focus on controlling your diabetes during the weeks before surgery. . Make sure that the doctor who takes care of your diabetes knows about your planned surgery including the date and location.  How do I manage my blood sugar before surgery? . Check your blood sugar at least 4 times a day, starting 2 days before surgery, to make sure that the level is not too  high or low. o Check your blood sugar the morning of your surgery when you wake up and every 2 hours until you get to the Short Stay unit. . If your blood sugar is less than 70 mg/dL, you will need to treat for low blood sugar: o Do not take insulin. o Treat a low blood sugar (less than 70 mg/dL) with  cup of clear juice (cranberry or apple), 4 glucose tablets, OR glucose gel. o Recheck blood sugar in 15 minutes after treatment (to make sure it is greater than 70 mg/dL). If your blood sugar is not greater than 70 mg/dL on recheck, call [phone] for further instructions. . Report your blood sugar to the short stay nurse when you get to Short Stay.  . If you are admitted to the hospital after surgery: o Your blood sugar will be checked by the staff and you will probably be given insulin after surgery (instead of oral diabetes medicines) to make sure you have good blood sugar levels. o The goal for blood sugar control after surgery is 80-180 mg/dL.        WHAT DO I DO ABOUT MY DIABETES MEDICATION?   Marland Kitchen Do not take oral diabetes medicines (pills) the morning of surgery.  . THE NIGHT BEFORE SURGERY, take normal dose of regular sliding scale insulin with dinner and 5 units of  NPH insulin.       . THE MORNING OF SURGERY, take 10 units of NPH insulin and no regular insulin unless your CBG is greater  than 220 mg/dL, then you may take  of your sliding scale (correction) dose of insulin.  . The day of surgery, do not take other diabetes injectables, including Byetta (exenatide), Bydureon (exenatide ER), Victoza (liraglutide), or Trulicity (dulaglutide).  . If your CBG is greater than 220 mg/dL, you may take  of your sliding scale (correction) dose of insulin.  Reviewed and Endorsed by [facility] Patient Education Committee, August 2015   Do not wear jewelry, make-up or nail polish.  Do not wear lotions, powders, or perfumes, or deoderant.  Do not shave 48 hours prior to surgery.   Do  not bring valuables to the hospital.  [facility] is not responsible for any belongings or valuables.  Contacts, dentures or bridgework may not be worn into surgery.  Leave your suitcase in the car.  After surgery it may be brought to your room.  For patients admitted to the hospital, discharge time will be determined by your treatment team.  Patients discharged the day of surgery will not be allowed to drive home.   Special instructions:   [facility]- Preparing For Surgery  Before surgery, you can play an important role. Because skin is not sterile, your skin needs to be as free of germs as possible. You can reduce the number of germs on your skin by washing with CHG (chlorahexidine gluconate) Soap before surgery.  CHG is an antiseptic cleaner which kills germs and bonds with the skin to continue killing germs even after washing.  Please do not use if you have an allergy to CHG or antibacterial soaps. If your skin becomes reddened/irritated stop using the CHG.  Do not shave (including legs and underarms) for at least 48 hours prior to first CHG shower. It is OK to shave your face.  Please follow these instructions carefully.   1. Shower the NIGHT BEFORE SURGERY and the MORNING OF SURGERY with CHG.   2. If you chose to wash your hair, wash your hair first as usual with your normal shampoo.  3. After you shampoo, rinse your hair and body thoroughly to remove the shampoo.  4. Use CHG as you would any other liquid soap. You can apply CHG directly to the skin and wash gently with a scrungie or a clean washcloth.   5. Apply the CHG Soap to your body ONLY FROM THE NECK DOWN.  Do not use on open wounds or open sores. Avoid contact with your eyes, ears, mouth and genitals (private parts). Wash genitals (private parts) with your normal soap.  6. Wash thoroughly, paying special attention to the area where your surgery will be performed.  7. Thoroughly rinse your body with warm water from the  neck down.  8. DO NOT shower/wash with your normal soap after using and rinsing off the CHG Soap.  9. Pat yourself dry with a CLEAN TOWEL.   10. Wear CLEAN PAJAMAS   11. Place CLEAN SHEETS on your bed the night of your first shower and DO NOT SLEEP WITH PETS.    Day of Surgery: Do not apply any deodorants/lotions. Please wear clean clothes to the hospital/surgery center.     Please read over the following fact sheets that you were given. Pain Booklet, Coughing and Deep Breathing, MRSA Information and Surgical Site Infection Prevention

## 2016-08-06 NOTE — Progress Notes (Addendum)
ZOX:WRUEPCP:Cook, Verdis FredericksonJayce G, DO  Cardiologist: pt denies   EKG: 07/14/2016 in [facility]  Stress test: pt denies ever   ECHO: pt denies ever  Cardiac Cath: pt denies ever  [facility]

## 2016-08-06 NOTE — Telephone Encounter (Signed)
Marchelle Folksmanda called stating the patient only has one tylenol 3 left and patient is requesting another refill, Marchelle Folksmanda states the patient's surgery is on Friday. Please advise [phone] Marchelle Folksmanda   [facility] [address] [facility]     I am don't see where we've seen this patient at [facility], I tried to call [phone] no answer no voicemail to get clarification on who prescribes this medication for the patient.

## 2016-08-06 NOTE — Telephone Encounter (Signed)
[facility] called [facility] and advised.

## 2016-08-06 NOTE — Telephone Encounter (Signed)
Okay to refill Tylenol 3 1 tab p.o. every 6 hours as needed pain #30, no refills

## 2016-08-06 NOTE — Telephone Encounter (Signed)
Last refill 07/31/16 #10/0 refills.

## 2016-08-07 ENCOUNTER — Encounter (HOSPITAL_COMMUNITY): Payer: Self-pay

## 2016-08-07 ENCOUNTER — Encounter (HOSPITAL_COMMUNITY)
Admission: RE | Admit: 2016-08-07 | Discharge: 2016-08-07 | Disposition: A | Discharge status: Home / Self Care | Payer: Medicare HMO | Source: Ambulatory Visit | Attending: Neurosurgery | Admitting: Neurosurgery

## 2016-08-07 DIAGNOSIS — M48062 Spinal stenosis, lumbar region with neurogenic claudication: Secondary | ICD-10-CM | POA: Insufficient documentation

## 2016-08-07 DIAGNOSIS — Z01812 Encounter for preprocedural laboratory examination: Secondary | ICD-10-CM | POA: Insufficient documentation

## 2016-08-07 DIAGNOSIS — E1122 Type 2 diabetes mellitus with diabetic chronic kidney disease: Secondary | ICD-10-CM | POA: Diagnosis not present

## 2016-08-07 DIAGNOSIS — I129 Hypertensive chronic kidney disease with stage 1 through stage 4 chronic kidney disease, or unspecified chronic kidney disease: Secondary | ICD-10-CM | POA: Diagnosis not present

## 2016-08-07 DIAGNOSIS — N189 Chronic kidney disease, unspecified: Secondary | ICD-10-CM

## 2016-08-07 DIAGNOSIS — R69 Illness, unspecified: Secondary | ICD-10-CM | POA: Diagnosis not present

## 2016-08-07 DIAGNOSIS — K219 Gastro-esophageal reflux disease without esophagitis: Secondary | ICD-10-CM | POA: Diagnosis not present

## 2016-08-07 DIAGNOSIS — Z87440 Personal history of urinary (tract) infections: Secondary | ICD-10-CM | POA: Diagnosis not present

## 2016-08-07 DIAGNOSIS — Z90710 Acquired absence of both cervix and uterus: Secondary | ICD-10-CM | POA: Diagnosis not present

## 2016-08-07 DIAGNOSIS — Z794 Long term (current) use of insulin: Secondary | ICD-10-CM | POA: Diagnosis not present

## 2016-08-07 DIAGNOSIS — H9190 Unspecified hearing loss, unspecified ear: Secondary | ICD-10-CM | POA: Diagnosis not present

## 2016-08-07 DIAGNOSIS — E079 Disorder of thyroid, unspecified: Secondary | ICD-10-CM | POA: Diagnosis not present

## 2016-08-07 DIAGNOSIS — Z9049 Acquired absence of other specified parts of digestive tract: Secondary | ICD-10-CM | POA: Diagnosis not present

## 2016-08-07 DIAGNOSIS — K279 Peptic ulcer, site unspecified, unspecified as acute or chronic, without hemorrhage or perforation: Secondary | ICD-10-CM | POA: Diagnosis not present

## 2016-08-07 DIAGNOSIS — Z79899 Other long term (current) drug therapy: Secondary | ICD-10-CM | POA: Diagnosis not present

## 2016-08-07 DIAGNOSIS — F329 Major depressive disorder, single episode, unspecified: Secondary | ICD-10-CM | POA: Diagnosis not present

## 2016-08-07 DIAGNOSIS — N183 Chronic kidney disease, stage 3 (moderate): Secondary | ICD-10-CM | POA: Diagnosis not present

## 2016-08-07 HISTORY — DX: Chronic kidney disease, unspecified: N18.9

## 2016-08-07 HISTORY — DX: Gastro-esophageal reflux disease without esophagitis: K21.9

## 2016-08-07 HISTORY — PX: ESOPHAGEAL DILATION: SHX303

## 2016-08-07 LAB — CBC
HEMATOCRIT: 38.6 % (ref 36.0–46.0)
HEMOGLOBIN: 12 g/dL (ref 12.0–15.0)
MCH: 27.5 pg (ref 26.0–34.0)
MCHC: 31.1 g/dL (ref 30.0–36.0)
MCV: 88.5 fL (ref 78.0–100.0)
Platelets: 214 10*3/uL (ref 150–400)
RBC: 4.36 MIL/uL (ref 3.87–5.11)
RDW: 13.2 % (ref 11.5–15.5)
WBC: 8.6 10*3/uL (ref 4.0–10.5)

## 2016-08-07 LAB — BASIC METABOLIC PANEL
Anion gap: 10 (ref 5–15)
BUN: 21 mg/dL — AB (ref 6–20)
CHLORIDE: 105 mmol/L (ref 101–111)
CO2: 26 mmol/L (ref 22–32)
CREATININE: 1.4 mg/dL — AB (ref 0.44–1.00)
Calcium: 8.7 mg/dL — ABNORMAL LOW (ref 8.9–10.3)
GFR calc Af Amer: 39 mL/min — ABNORMAL LOW (ref >60)
GFR calc non Af Amer: 33 mL/min — ABNORMAL LOW (ref >60)
Glucose, Bld: 195 mg/dL — ABNORMAL HIGH (ref 65–99)
POTASSIUM: 4.9 mmol/L (ref 3.5–5.1)
Sodium: 141 mmol/L (ref 135–145)

## 2016-08-07 LAB — ABO/RH: ABO/RH(D): B NEG

## 2016-08-07 LAB — GLUCOSE, CAPILLARY: GLUCOSE-CAPILLARY: 196 mg/dL — AB (ref 65–99)

## 2016-08-07 LAB — TYPE AND SCREEN
ABO/RH(D): B NEG
Antibody Screen: NEGATIVE

## 2016-08-07 LAB — SURGICAL PCR SCREEN
MRSA, PCR: NEGATIVE
STAPHYLOCOCCUS AUREUS: NEGATIVE

## 2016-08-07 MED ORDER — CHLORHEXIDINE GLUCONATE CLOTH 2 % EX PADS: 6.0000 | MEDICATED_PAD | Freq: Once | CUTANEOUS | Status: DC

## 2016-08-09 MED ORDER — CEFAZOLIN SODIUM-DEXTROSE 2-4 GM/100ML-% IV SOLN
2.0000 g | INTRAVENOUS | Status: AC
  Administered 2016-08-10: 2 g via INTRAVENOUS
  Filled 2016-08-09: qty 100

## 2016-08-10 ENCOUNTER — Observation Stay (HOSPITAL_COMMUNITY)
Admission: RE | Admit: 2016-08-10 | Discharge: 2016-08-11 | Disposition: A | Discharge status: Home / Self Care | Payer: Medicare HMO | Source: Ambulatory Visit | Attending: Neurosurgery | Admitting: Neurosurgery

## 2016-08-10 ENCOUNTER — Ambulatory Visit (HOSPITAL_COMMUNITY): Payer: Medicare HMO | Admitting: Certified Registered Nurse Anesthetist

## 2016-08-10 ENCOUNTER — Ambulatory Visit (HOSPITAL_COMMUNITY): Payer: Medicare HMO

## 2016-08-10 ENCOUNTER — Encounter (HOSPITAL_COMMUNITY): Payer: Self-pay | Admitting: Certified Registered Nurse Anesthetist

## 2016-08-10 ENCOUNTER — Encounter (HOSPITAL_COMMUNITY)
Admission: RE | Disposition: A | Discharge status: Home / Self Care | Payer: Self-pay | Source: Ambulatory Visit | Attending: Neurosurgery

## 2016-08-10 DIAGNOSIS — E079 Disorder of thyroid, unspecified: Secondary | ICD-10-CM | POA: Insufficient documentation

## 2016-08-10 DIAGNOSIS — Z90710 Acquired absence of both cervix and uterus: Secondary | ICD-10-CM | POA: Diagnosis not present

## 2016-08-10 DIAGNOSIS — Z419 Encounter for procedure for purposes other than remedying health state, unspecified: Secondary | ICD-10-CM

## 2016-08-10 DIAGNOSIS — M48062 Spinal stenosis, lumbar region with neurogenic claudication: Secondary | ICD-10-CM | POA: Diagnosis not present

## 2016-08-10 DIAGNOSIS — Z794 Long term (current) use of insulin: Secondary | ICD-10-CM | POA: Diagnosis not present

## 2016-08-10 DIAGNOSIS — M963 Postlaminectomy kyphosis: Secondary | ICD-10-CM | POA: Diagnosis not present

## 2016-08-10 DIAGNOSIS — N183 Chronic kidney disease, stage 3 (moderate): Secondary | ICD-10-CM | POA: Diagnosis not present

## 2016-08-10 DIAGNOSIS — Z79899 Other long term (current) drug therapy: Secondary | ICD-10-CM | POA: Insufficient documentation

## 2016-08-10 DIAGNOSIS — Z87440 Personal history of urinary (tract) infections: Secondary | ICD-10-CM | POA: Insufficient documentation

## 2016-08-10 DIAGNOSIS — M4807 Spinal stenosis, lumbosacral region: Secondary | ICD-10-CM | POA: Diagnosis not present

## 2016-08-10 DIAGNOSIS — K279 Peptic ulcer, site unspecified, unspecified as acute or chronic, without hemorrhage or perforation: Secondary | ICD-10-CM | POA: Insufficient documentation

## 2016-08-10 DIAGNOSIS — K219 Gastro-esophageal reflux disease without esophagitis: Secondary | ICD-10-CM | POA: Insufficient documentation

## 2016-08-10 DIAGNOSIS — I129 Hypertensive chronic kidney disease with stage 1 through stage 4 chronic kidney disease, or unspecified chronic kidney disease: Secondary | ICD-10-CM | POA: Diagnosis not present

## 2016-08-10 DIAGNOSIS — Z9049 Acquired absence of other specified parts of digestive tract: Secondary | ICD-10-CM | POA: Diagnosis not present

## 2016-08-10 DIAGNOSIS — H9190 Unspecified hearing loss, unspecified ear: Secondary | ICD-10-CM | POA: Insufficient documentation

## 2016-08-10 DIAGNOSIS — F329 Major depressive disorder, single episode, unspecified: Secondary | ICD-10-CM | POA: Insufficient documentation

## 2016-08-10 DIAGNOSIS — R69 Illness, unspecified: Secondary | ICD-10-CM | POA: Diagnosis not present

## 2016-08-10 DIAGNOSIS — E1122 Type 2 diabetes mellitus with diabetic chronic kidney disease: Secondary | ICD-10-CM | POA: Diagnosis not present

## 2016-08-10 HISTORY — PX: LUMBAR LAMINECTOMY/DECOMPRESSION MICRODISCECTOMY: SHX5026

## 2016-08-10 LAB — GLUCOSE, CAPILLARY
GLUCOSE-CAPILLARY: 444 mg/dL — AB (ref 65–99)
Glucose-Capillary: 120 mg/dL — ABNORMAL HIGH (ref 65–99)
Glucose-Capillary: 187 mg/dL — ABNORMAL HIGH (ref 65–99)

## 2016-08-10 SURGERY — LUMBAR LAMINECTOMY/DECOMPRESSION MICRODISCECTOMY 2 LEVELS
Anesthesia: General | Site: Spine Lumbar

## 2016-08-10 MED ORDER — SODIUM CHLORIDE 0.9% FLUSH: 3.0000 mL | INTRAVENOUS | Status: DC | PRN

## 2016-08-10 MED ORDER — ROCURONIUM BROMIDE 100 MG/10ML IV SOLN
INTRAVENOUS | Status: DC | PRN
  Administered 2016-08-10: 40 mg via INTRAVENOUS

## 2016-08-10 MED ORDER — INSULIN GLARGINE 100 UNIT/ML ~~LOC~~ SOLN
10.0000 [IU] | Freq: Every day | SUBCUTANEOUS | Status: DC
  Administered 2016-08-10: 10 [IU] via SUBCUTANEOUS
  Filled 2016-08-10 (×2): qty 0.1

## 2016-08-10 MED ORDER — DIAZEPAM 5 MG PO TABS: 5.0000 mg | ORAL_TABLET | Freq: Four times a day (QID) | ORAL | Status: DC | PRN

## 2016-08-10 MED ORDER — ALBUTEROL SULFATE (2.5 MG/3ML) 0.083% IN NEBU: 2.5000 mg | INHALATION_SOLUTION | Freq: Four times a day (QID) | RESPIRATORY_TRACT | Status: DC | PRN

## 2016-08-10 MED ORDER — INSULIN ASPART 100 UNIT/ML ~~LOC~~ SOLN: 0.0000 [IU] | Freq: Three times a day (TID) | SUBCUTANEOUS | Status: DC

## 2016-08-10 MED ORDER — RISAQUAD PO CAPS
1.0000 | ORAL_CAPSULE | Freq: Every day | ORAL | Status: DC
  Administered 2016-08-11: 1 via ORAL
  Filled 2016-08-10: qty 1

## 2016-08-10 MED ORDER — MENTHOL 3 MG MT LOZG: 1.0000 | LOZENGE | OROMUCOSAL | Status: DC | PRN

## 2016-08-10 MED ORDER — THROMBIN 5000 UNITS EX SOLR
CUTANEOUS | Status: DC | PRN
  Administered 2016-08-10 (×2): 5000 [IU] via TOPICAL

## 2016-08-10 MED ORDER — MIDAZOLAM HCL 2 MG/2ML IJ SOLN
INTRAMUSCULAR | Status: AC
  Filled 2016-08-10: qty 2

## 2016-08-10 MED ORDER — LEVOTHYROXINE SODIUM 75 MCG PO TABS
150.0000 ug | ORAL_TABLET | Freq: Every day | ORAL | Status: DC
  Administered 2016-08-11: 150 ug via ORAL
  Filled 2016-08-10: qty 2

## 2016-08-10 MED ORDER — CALCIUM CARBONATE-VITAMIN D 500-200 MG-UNIT PO TABS
ORAL_TABLET | Freq: Every day | ORAL | Status: DC
  Administered 2016-08-11: 1 via ORAL
  Filled 2016-08-10: qty 1

## 2016-08-10 MED ORDER — INSULIN ASPART 100 UNIT/ML ~~LOC~~ SOLN: 0.0000 [IU] | Freq: Every day | SUBCUTANEOUS | Status: DC

## 2016-08-10 MED ORDER — ALBUTEROL SULFATE (2.5 MG/3ML) 0.083% IN NEBU: 3.0000 mL | INHALATION_SOLUTION | Freq: Four times a day (QID) | RESPIRATORY_TRACT | Status: DC | PRN

## 2016-08-10 MED ORDER — AMITRIPTYLINE HCL 25 MG PO TABS
150.0000 mg | ORAL_TABLET | Freq: Every day | ORAL | Status: DC
  Administered 2016-08-10: 150 mg via ORAL
  Filled 2016-08-10: qty 6

## 2016-08-10 MED ORDER — ONDANSETRON HCL 4 MG/2ML IJ SOLN
INTRAMUSCULAR | Status: AC
  Filled 2016-08-10: qty 2

## 2016-08-10 MED ORDER — CO Q 10 100 MG PO CAPS: 1.0000 | ORAL_CAPSULE | Freq: Every day | ORAL | Status: DC

## 2016-08-10 MED ORDER — SUGAMMADEX SODIUM 200 MG/2ML IV SOLN
INTRAVENOUS | Status: DC | PRN
  Administered 2016-08-10: 200 mg via INTRAVENOUS

## 2016-08-10 MED ORDER — DEXAMETHASONE SODIUM PHOSPHATE 4 MG/ML IJ SOLN
INTRAMUSCULAR | Status: DC | PRN
  Administered 2016-08-10: 10 mg via INTRAVENOUS

## 2016-08-10 MED ORDER — SENNOSIDES-DOCUSATE SODIUM 8.6-50 MG PO TABS
1.0000 | ORAL_TABLET | Freq: Every day | ORAL | Status: DC
  Administered 2016-08-10: 1 via ORAL
  Filled 2016-08-10: qty 1

## 2016-08-10 MED ORDER — LIDOCAINE HCL (CARDIAC) 20 MG/ML IV SOLN
INTRAVENOUS | Status: DC | PRN
  Administered 2016-08-10: 80 mg via INTRAVENOUS

## 2016-08-10 MED ORDER — HYDROCODONE-ACETAMINOPHEN 5-325 MG PO TABS: 1.0000 | ORAL_TABLET | ORAL | Status: DC | PRN

## 2016-08-10 MED ORDER — LISINOPRIL 10 MG PO TABS
10.0000 mg | ORAL_TABLET | Freq: Every day | ORAL | Status: DC
  Administered 2016-08-11: 10 mg via ORAL
  Filled 2016-08-10: qty 1

## 2016-08-10 MED ORDER — DOCUSATE SODIUM 100 MG PO CAPS
100.0000 mg | ORAL_CAPSULE | Freq: Two times a day (BID) | ORAL | Status: DC
Start: 2016-08-10 — End: 2016-08-11
  Administered 2016-08-10 – 2016-08-11 (×2): 100 mg via ORAL
  Filled 2016-08-10 (×2): qty 1

## 2016-08-10 MED ORDER — SODIUM CHLORIDE 0.9 % IV SOLN: 250.0000 mL | INTRAVENOUS | Status: DC

## 2016-08-10 MED ORDER — OXYCODONE HCL 5 MG PO TABS: 5.0000 mg | ORAL_TABLET | Freq: Once | ORAL | Status: DC | PRN

## 2016-08-10 MED ORDER — ROCURONIUM BROMIDE 10 MG/ML (PF) SYRINGE
PREFILLED_SYRINGE | INTRAVENOUS | Status: AC
  Filled 2016-08-10: qty 5

## 2016-08-10 MED ORDER — BUPIVACAINE HCL (PF) 0.5 % IJ SOLN
INTRAMUSCULAR | Status: AC
  Filled 2016-08-10: qty 30

## 2016-08-10 MED ORDER — PANTOPRAZOLE SODIUM 40 MG PO TBEC
40.0000 mg | DELAYED_RELEASE_TABLET | Freq: Every day | ORAL | Status: DC
  Administered 2016-08-11: 40 mg via ORAL
  Filled 2016-08-10: qty 1

## 2016-08-10 MED ORDER — THROMBIN 5000 UNITS EX SOLR
CUTANEOUS | Status: AC
  Filled 2016-08-10: qty 10000

## 2016-08-10 MED ORDER — PHENYLEPHRINE HCL 10 MG/ML IJ SOLN
INTRAVENOUS | Status: DC | PRN
  Administered 2016-08-10: 20 ug/min via INTRAVENOUS

## 2016-08-10 MED ORDER — FENTANYL CITRATE (PF) 100 MCG/2ML IJ SOLN: 25.0000 ug | INTRAMUSCULAR | Status: DC | PRN

## 2016-08-10 MED ORDER — PHENOL 1.4 % MT LIQD: 1.0000 | OROMUCOSAL | Status: DC | PRN

## 2016-08-10 MED ORDER — OXYCODONE HCL 5 MG/5ML PO SOLN: 5.0000 mg | Freq: Once | ORAL | Status: DC | PRN

## 2016-08-10 MED ORDER — INSULIN NPH (HUMAN) (ISOPHANE) 100 UNIT/ML ~~LOC~~ SUSP
20.0000 [IU] | Freq: Every day | SUBCUTANEOUS | Status: DC
  Filled 2016-08-10: qty 10

## 2016-08-10 MED ORDER — PHENYLEPHRINE 40 MCG/ML (10ML) SYRINGE FOR IV PUSH (FOR BLOOD PRESSURE SUPPORT)
PREFILLED_SYRINGE | INTRAVENOUS | Status: AC
  Filled 2016-08-10: qty 10

## 2016-08-10 MED ORDER — ACETAMINOPHEN 650 MG RE SUPP: 650.0000 mg | Freq: Four times a day (QID) | RECTAL | Status: DC | PRN

## 2016-08-10 MED ORDER — INSULIN ASPART 100 UNIT/ML ~~LOC~~ SOLN
0.0000 [IU] | Freq: Three times a day (TID) | SUBCUTANEOUS | Status: DC
  Administered 2016-08-10: 15 [IU] via SUBCUTANEOUS
  Administered 2016-08-11: 2 [IU] via SUBCUTANEOUS
  Administered 2016-08-11: 3 [IU] via SUBCUTANEOUS

## 2016-08-10 MED ORDER — HEMOSTATIC AGENTS (NO CHARGE) OPTIME
TOPICAL | Status: DC | PRN
  Administered 2016-08-10: 1 via TOPICAL

## 2016-08-10 MED ORDER — POTASSIUM CHLORIDE IN NACL 20-0.9 MEQ/L-% IV SOLN
INTRAVENOUS | Status: DC
  Filled 2016-08-10: qty 1000

## 2016-08-10 MED ORDER — LACTATED RINGERS IV SOLN
INTRAVENOUS | Status: DC | PRN
  Administered 2016-08-10: 07:00:00 via INTRAVENOUS

## 2016-08-10 MED ORDER — GABAPENTIN 100 MG PO CAPS
200.0000 mg | ORAL_CAPSULE | Freq: Three times a day (TID) | ORAL | Status: DC
  Administered 2016-08-10 – 2016-08-11 (×3): 200 mg via ORAL
  Filled 2016-08-10 (×3): qty 2

## 2016-08-10 MED ORDER — LIDOCAINE-EPINEPHRINE 0.5 %-1:200000 IJ SOLN
INTRAMUSCULAR | Status: AC
  Filled 2016-08-10: qty 1

## 2016-08-10 MED ORDER — ONDANSETRON HCL 4 MG/2ML IJ SOLN: 4.0000 mg | Freq: Four times a day (QID) | INTRAMUSCULAR | Status: DC | PRN

## 2016-08-10 MED ORDER — ACETAMINOPHEN 650 MG RE SUPP: 650.0000 mg | RECTAL | Status: DC | PRN

## 2016-08-10 MED ORDER — ONDANSETRON HCL 4 MG/2ML IJ SOLN
INTRAMUSCULAR | Status: DC | PRN
  Administered 2016-08-10: 4 mg via INTRAVENOUS

## 2016-08-10 MED ORDER — BUPIVACAINE HCL (PF) 0.5 % IJ SOLN
INTRAMUSCULAR | Status: DC | PRN
  Administered 2016-08-10: 30 mL

## 2016-08-10 MED ORDER — SODIUM CHLORIDE 0.9% FLUSH
3.0000 mL | Freq: Two times a day (BID) | INTRAVENOUS | Status: DC
  Administered 2016-08-10: 3 mL via INTRAVENOUS

## 2016-08-10 MED ORDER — VITAMIN B-6 100 MG PO TABS
100.0000 mg | ORAL_TABLET | Freq: Every day | ORAL | Status: DC
  Administered 2016-08-11: 100 mg via ORAL
  Filled 2016-08-10 (×2): qty 1

## 2016-08-10 MED ORDER — ACETAMINOPHEN 500 MG PO TABS
1000.0000 mg | ORAL_TABLET | Freq: Four times a day (QID) | ORAL | Status: AC
  Administered 2016-08-10 – 2016-08-11 (×3): 1000 mg via ORAL
  Filled 2016-08-10 (×3): qty 2

## 2016-08-10 MED ORDER — ONDANSETRON HCL 4 MG PO TABS: 4.0000 mg | ORAL_TABLET | Freq: Four times a day (QID) | ORAL | Status: DC | PRN

## 2016-08-10 MED ORDER — FENTANYL CITRATE (PF) 100 MCG/2ML IJ SOLN
INTRAMUSCULAR | Status: DC | PRN
  Administered 2016-08-10 (×3): 50 ug via INTRAVENOUS

## 2016-08-10 MED ORDER — ACETAMINOPHEN 325 MG PO TABS
650.0000 mg | ORAL_TABLET | Freq: Four times a day (QID) | ORAL | Status: DC | PRN
Start: 2016-08-11 — End: 2016-08-11

## 2016-08-10 MED ORDER — 0.9 % SODIUM CHLORIDE (POUR BTL) OPTIME
TOPICAL | Status: DC | PRN
  Administered 2016-08-10: 1000 mL

## 2016-08-10 MED ORDER — PHENYLEPHRINE HCL 10 MG/ML IJ SOLN
INTRAMUSCULAR | Status: DC | PRN
  Administered 2016-08-10 (×3): 80 ug via INTRAVENOUS

## 2016-08-10 MED ORDER — HYDROCODONE-ACETAMINOPHEN 5-325 MG PO TABS
1.0000 | ORAL_TABLET | Freq: Four times a day (QID) | ORAL | Status: DC | PRN
  Administered 2016-08-11: 1 via ORAL
  Filled 2016-08-10: qty 1

## 2016-08-10 MED ORDER — GLYCOPYRROLATE 0.2 MG/ML IJ SOLN
INTRAMUSCULAR | Status: DC | PRN
  Administered 2016-08-10: 0.2 mg via INTRAVENOUS

## 2016-08-10 MED ORDER — VITAMIN B-2 100 MG PO TABS: 100.0000 mg | ORAL_TABLET | Freq: Every day | ORAL | Status: DC

## 2016-08-10 MED ORDER — PROPOFOL 10 MG/ML IV BOLUS
INTRAVENOUS | Status: AC
  Filled 2016-08-10: qty 20

## 2016-08-10 MED ORDER — ACETAMINOPHEN 325 MG PO TABS: 650.0000 mg | ORAL_TABLET | ORAL | Status: DC | PRN

## 2016-08-10 MED ORDER — FENTANYL CITRATE (PF) 250 MCG/5ML IJ SOLN
INTRAMUSCULAR | Status: AC
  Filled 2016-08-10: qty 5

## 2016-08-10 MED ORDER — LIDOCAINE 2% (20 MG/ML) 5 ML SYRINGE
INTRAMUSCULAR | Status: AC
  Filled 2016-08-10: qty 5

## 2016-08-10 MED ORDER — PROPOFOL 10 MG/ML IV BOLUS
INTRAVENOUS | Status: DC | PRN
  Administered 2016-08-10: 100 mg via INTRAVENOUS

## 2016-08-10 MED ORDER — INSULIN ASPART 100 UNIT/ML ~~LOC~~ SOLN
3.0000 [IU] | Freq: Three times a day (TID) | SUBCUTANEOUS | Status: DC
  Administered 2016-08-11 (×2): 3 [IU] via SUBCUTANEOUS

## 2016-08-10 MED ORDER — SIMVASTATIN 40 MG PO TABS
40.0000 mg | ORAL_TABLET | Freq: Every day | ORAL | Status: DC
Start: 2016-08-10 — End: 2016-08-11
  Administered 2016-08-10: 40 mg via ORAL
  Filled 2016-08-10: qty 1

## 2016-08-10 MED ORDER — LINAGLIPTIN 5 MG PO TABS
5.0000 mg | ORAL_TABLET | Freq: Every day | ORAL | Status: DC
  Administered 2016-08-11: 5 mg via ORAL
  Filled 2016-08-10: qty 1

## 2016-08-10 MED ORDER — MORPHINE SULFATE (PF) 2 MG/ML IV SOLN: 1.0000 mg | INTRAVENOUS | Status: DC | PRN

## 2016-08-10 MED ORDER — ACETAMINOPHEN-CODEINE #3 300-30 MG PO TABS: 1.0000 | ORAL_TABLET | Freq: Four times a day (QID) | ORAL | Status: DC | PRN

## 2016-08-10 MED ORDER — LIDOCAINE-EPINEPHRINE 0.5 %-1:200000 IJ SOLN
INTRAMUSCULAR | Status: DC | PRN
  Administered 2016-08-10: 10 mL

## 2016-08-10 MED ORDER — DEXAMETHASONE SODIUM PHOSPHATE 10 MG/ML IJ SOLN
INTRAMUSCULAR | Status: AC
  Filled 2016-08-10: qty 1

## 2016-08-10 SURGICAL SUPPLY — 54 items
BAG DECANTER FOR FLEXI CONT (MISCELLANEOUS) IMPLANT
BENZOIN TINCTURE PRP APPL 2/3 (GAUZE/BANDAGES/DRESSINGS) ×2 IMPLANT
BLADE CLIPPER SURG (BLADE) IMPLANT
BUR MATCHSTICK NEURO 3.0 LAGG (BURR) ×2 IMPLANT
BUR PRECISION FLUTE 5.0 (BURR) ×4 IMPLANT
CANISTER SUCT 3000ML PPV (MISCELLANEOUS) ×2 IMPLANT
CARTRIDGE OIL MAESTRO DRILL (MISCELLANEOUS) ×1 IMPLANT
DECANTER SPIKE VIAL GLASS SM (MISCELLANEOUS) ×2 IMPLANT
DERMABOND ADHESIVE PROPEN (GAUZE/BANDAGES/DRESSINGS) ×1
DERMABOND ADVANCED (GAUZE/BANDAGES/DRESSINGS) ×1
DERMABOND ADVANCED .7 DNX12 (GAUZE/BANDAGES/DRESSINGS) ×1 IMPLANT
DERMABOND ADVANCED .7 DNX6 (GAUZE/BANDAGES/DRESSINGS) ×1 IMPLANT
DIFFUSER DRILL AIR PNEUMATIC (MISCELLANEOUS) ×2 IMPLANT
DRAPE LAPAROTOMY 100X72X124 (DRAPES) ×2 IMPLANT
DRAPE MICROSCOPE LEICA (MISCELLANEOUS) IMPLANT
DRAPE POUCH INSTRU U-SHP 10X18 (DRAPES) ×2 IMPLANT
DRAPE SURG 17X23 STRL (DRAPES) ×2 IMPLANT
DURAPREP 26ML APPLICATOR (WOUND CARE) ×2 IMPLANT
ELECT REM PT RETURN 9FT ADLT (ELECTROSURGICAL) ×2
ELECTRODE REM PT RTRN 9FT ADLT (ELECTROSURGICAL) ×1 IMPLANT
GAUZE SPONGE 4X4 12PLY STRL (GAUZE/BANDAGES/DRESSINGS) IMPLANT
GAUZE SPONGE 4X4 16PLY XRAY LF (GAUZE/BANDAGES/DRESSINGS) IMPLANT
GLOVE BIOGEL PI IND STRL 7.0 (GLOVE) ×4 IMPLANT
GLOVE BIOGEL PI INDICATOR 7.0 (GLOVE) ×4
GLOVE ECLIPSE 6.5 STRL STRAW (GLOVE) ×2 IMPLANT
GLOVE EXAM NITRILE LRG STRL (GLOVE) IMPLANT
GLOVE EXAM NITRILE XL STR (GLOVE) IMPLANT
GLOVE EXAM NITRILE XS STR PU (GLOVE) IMPLANT
GLOVE SURG SS PI 7.0 STRL IVOR (GLOVE) ×2 IMPLANT
GOWN STRL REUS W/ TWL LRG LVL3 (GOWN DISPOSABLE) ×2 IMPLANT
GOWN STRL REUS W/ TWL XL LVL3 (GOWN DISPOSABLE) ×1 IMPLANT
GOWN STRL REUS W/TWL 2XL LVL3 (GOWN DISPOSABLE) IMPLANT
GOWN STRL REUS W/TWL LRG LVL3 (GOWN DISPOSABLE) ×2
GOWN STRL REUS W/TWL XL LVL3 (GOWN DISPOSABLE) ×1
GRAFT DURAGEN MATRIX 1WX1L (Tissue) ×2 IMPLANT
KIT BASIN OR (CUSTOM PROCEDURE TRAY) ×2 IMPLANT
KIT ROOM TURNOVER OR (KITS) ×2 IMPLANT
NEEDLE HYPO 25X1 1.5 SAFETY (NEEDLE) ×2 IMPLANT
NEEDLE SPNL 18GX3.5 QUINCKE PK (NEEDLE) ×2 IMPLANT
NS IRRIG 1000ML POUR BTL (IV SOLUTION) ×2 IMPLANT
OIL CARTRIDGE MAESTRO DRILL (MISCELLANEOUS) ×2
PACK LAMINECTOMY NEURO (CUSTOM PROCEDURE TRAY) ×2 IMPLANT
PAD ARMBOARD 7.5X6 YLW CONV (MISCELLANEOUS) ×10 IMPLANT
RUBBERBAND STERILE (MISCELLANEOUS) IMPLANT
SPONGE LAP 4X18 X RAY DECT (DISPOSABLE) IMPLANT
SPONGE SURGIFOAM ABS GEL SZ50 (HEMOSTASIS) ×2 IMPLANT
STRIP CLOSURE SKIN 1/2X4 (GAUZE/BANDAGES/DRESSINGS) IMPLANT
SUT VIC AB 0 CT1 18XCR BRD8 (SUTURE) ×2 IMPLANT
SUT VIC AB 0 CT1 8-18 (SUTURE) ×2
SUT VIC AB 2-0 CT1 18 (SUTURE) ×2 IMPLANT
SUT VIC AB 3-0 SH 8-18 (SUTURE) ×6 IMPLANT
TOWEL GREEN STERILE (TOWEL DISPOSABLE) ×2 IMPLANT
TOWEL GREEN STERILE FF (TOWEL DISPOSABLE) ×2 IMPLANT
WATER STERILE IRR 1000ML POUR (IV SOLUTION) ×2 IMPLANT

## 2016-08-10 NOTE — Anesthesia Preprocedure Evaluation (Addendum)
Anesthesia Evaluation  Patient identified by MRN, date of birth, ID band Patient awake    Reviewed: Allergy & Precautions, H&P , NPO status , Patient's Chart, lab work & pertinent test results  Airway Mallampati: II   Neck ROM: full    Dental  (+) Partial Upper, Poor Dentition, Partial Lower, Dental Advisory Given   Pulmonary neg pulmonary ROS,    breath sounds clear to auscultation       Cardiovascular hypertension,  Rhythm:regular Rate:Normal     Neuro/Psych PSYCHIATRIC DISORDERS Depression  Neuromuscular disease    GI/Hepatic PUD, GERD  ,  Endo/Other  diabetes, Type 2Hypothyroidism   Renal/GU Renal InsufficiencyRenal disease     Musculoskeletal  (+) Arthritis ,   Abdominal   Peds  Hematology   Anesthesia Other Findings   Reproductive/Obstetrics                            Anesthesia Physical Anesthesia Plan  ASA: II  Anesthesia Plan: General   Post-op Pain Management:    Induction: Intravenous  PONV Risk Score and Plan: 3 and Ondansetron, Dexamethasone, Propofol, Midazolam and Treatment may vary due to age or medical condition  Airway Management Planned: Oral ETT  Additional Equipment:   Intra-op Plan:   Post-operative Plan: Extubation in OR  Informed Consent: I have reviewed the patients History and Physical, chart, labs and discussed the procedure including the risks, benefits and alternatives for the proposed anesthesia with the patient or authorized representative who has indicated his/her understanding and acceptance.     Plan Discussed with: CRNA, Anesthesiologist and Surgeon  Anesthesia Plan Comments:         Anesthesia Quick Evaluation

## 2016-08-10 NOTE — Op Note (Signed)
08/10/2016  10:58 AM  PATIENT:  Amy Calhoun  83 y.o. female  PRE-OPERATIVE DIAGNOSIS:  LUMBAR STENOSIS WITH NEUROGENIC CLAUDICATION L3/4,4/5, 5/1  POST-OPERATIVE DIAGNOSIS:  LUMBAR STENOSIS WITH NEUROGENIC CLAUDICATION L3/4,4/5,5/1  PROCEDURE:  Procedure(s): BILATERAL HEMILAMINECTOMY LUMBAR THREE-FOUR,LUMBAR FOUR-FIVE,LEFT LUMBAR FIVE-SACRAL ONE HEMILAMINECTOMY AND DECOMPRESSION for decompression of the L3,4,5,and left S1 roots  SURGEON: Surgeon(s): Coletta Memosabbell, Makaylin Carlo, MD  ASSISTANTS:meyran, Selena BattenKim  ANESTHESIA:   general  EBL:  Total I/O In: 1000 [I.V.:1000] Out: 450 [Urine:300; Blood:150]  BLOOD ADMINISTERED:none  CELL SAVER GIVEN:none  COUNT:per nursing  DRAINS: none   SPECIMEN:  No Specimen  DICTATION: Ivonna Fuller [name] was taken to the operating room, intubated, and placed under a general anesthetic without difficulty. She was positioned prone on a Wilson frame with all pressure points padded. Her back was prepped and draped in a sterile manner. I infiltrated liodcaine in the planned incision. I opened the skin with a 10 blade exposing the thoracolumbar fascia. In a subperiosteal fashion I exposed the lamina of L3,4,5, and S1 bilaterally. I confirmed my location with an intraoperative xray. I placed self retaining retractors and proceeded with the decompression.  I using the drill, performed bilateral semihemilaminectomies of L3, L4, and L5 on the left. I used the Kerrison punches to complete the semihemilaminectomies at each level. I decompressed the L3,4,5, and the S1 nerve roots. I removed the ligamentum flavum, and the midline ligament so that I could pass a blunt dissector from one side to the other easily. I performed foraminotomies bilaterally at L3,4,5, and the left S1 foramen using the Kerrison punches. The ligament was quite stiff, and thick, and was the chief element of the compression. I then irrigated the wound and started my closure.  With [employer]et Healthcare[facility] Meyran's assistance we  closed approximating the thoracolumbar, subcutaneous, and subcuticular layers with vicryl sutures.  I did place duragen over exposed arachnoid, there was no csf leak however.  I used dermabond for a sterile dressing.   PLAN OF CARE: Admit to inpatient   PATIENT DISPOSITION:  PACU - hemodynamically stable.   Delay start of Pharmacological VTE agent (>24hrs) due to surgical blood loss or risk of bleeding:  yes

## 2016-08-10 NOTE — Anesthesia Postprocedure Evaluation (Signed)
Anesthesia Post Note  Patient: Amy Calhoun  Procedure(s) Performed: Procedure(s) (LRB): BILATERAL HEMILAMINECTOMY LUMBAR THREE-FOUR,LUMBAR FOUR-FIVE,LEFT LUMBAR FIVE-SACRAL ONE HEMILAMINECTOMY AND DECOMPRESSION (N/A)     Patient location during evaluation: PACU Anesthesia Type: General Level of consciousness: awake and alert and patient cooperative Pain management: pain level controlled Vital Signs Assessment: post-procedure vital signs reviewed and stable Respiratory status: spontaneous breathing and respiratory function stable Cardiovascular status: stable Anesthetic complications: no    Last Vitals:  Vitals:   08/10/16 1200 08/10/16 1215  BP: 99/83   Pulse: 86 84  Resp: 17 14  Temp:  (!) 36.2 C    Last Pain:  Vitals:   08/10/16 0650  TempSrc:   PainSc: 0-No pain                 Trenika Hudson S

## 2016-08-10 NOTE — Progress Notes (Signed)
Spoke with Dr Franky Machoabbell about patients 444 cbg he has taken care of the insulin orders.

## 2016-08-10 NOTE — Transfer of Care (Signed)
Immediate Anesthesia Transfer of Care Note  Patient: Amy Calhoun  Procedure(s) Performed: Procedure(s): BILATERAL HEMILAMINECTOMY LUMBAR THREE-FOUR,LUMBAR FOUR-FIVE,LEFT LUMBAR FIVE-SACRAL ONE HEMILAMINECTOMY AND DECOMPRESSION (N/A)  Patient Location: PACU  Anesthesia Type:General  Level of Consciousness: awake and alert   Airway & Oxygen Therapy: Patient Spontanous Breathing and Patient connected to nasal cannula oxygen  Post-op Assessment: Report given to RN, Post -op Vital signs reviewed and stable and Patient moving all extremities  Post vital signs: Reviewed and stable  Last Vitals:  Vitals:   08/10/16 0644 08/10/16 1108  BP: (!) 154/71   Pulse: 88   Resp: 16   Temp: 37.1 C (P) 36.7 C    Last Pain:  Vitals:   08/10/16 0650  TempSrc:   PainSc: 0-No pain         Complications: No apparent anesthesia complications

## 2016-08-10 NOTE — Anesthesia Procedure Notes (Signed)
Procedure Name: Intubation Date/Time: 08/10/2016 8:02 AM Performed by: Reine JustFLOWERS, Markelle Asaro T Pre-anesthesia Checklist: Patient identified, Emergency Drugs available, Suction available, Patient being monitored and Timeout performed Patient Re-evaluated:Patient Re-evaluated prior to induction Oxygen Delivery Method: Circle system utilized Preoxygenation: Pre-oxygenation with 100% oxygen Induction Type: IV induction Ventilation: Mask ventilation without difficulty Laryngoscope Size: Miller and 2 Grade View: Grade I Tube type: Oral Tube size: 7.5 mm Number of attempts: 1 Airway Equipment and Method: Patient positioned with wedge pillow and Stylet Placement Confirmation: ETT inserted through vocal cords under direct vision,  positive ETCO2 and breath sounds checked- equal and bilateral Secured at: 21 cm Tube secured with: Tape Dental Injury: Teeth and Oropharynx as per pre-operative assessment

## 2016-08-10 NOTE — H&P (Signed)
BP (!) 154/71   Pulse 88   Temp 98.8 F (37.1 C) (Oral)   Resp 16   Wt 71.7 kg (158 lb)   SpO2 96%   BMI 25.50 kg/m  Amy Calhoun comes in today for evaluation of the pain that she has in the back and left lower extremity. She has had injections to the left lower extremity done by Dr. Alvester MorinNewton and she says that they have been fairly ineffective. She did feel numbness in the area of pain, but once the numbness wore off at her last injection, the pain recurred. 5 feet 4 inches in height, weighs 157 pounds. Temperature is 97.4, blood pressure is 159/69, pulse is 78, pain is 5/10. She is 83 years of age, retired. She does not smoke. She does not drink alcohol. No history of substance abuse. Past medical history includes diabetes. She is insulin dependent and has grade 3 kidney failure. She has undergone a hysterectomy, cholecystectomy, sinus surgery, thyroid surgery. Mother and father are both deceased. Diabetes present in the family history. She has pain in and around the left hip. She says nothing will relieve the pain. It varies day-to-day. She does have weakness in the left lower extremity. She has changed the mattress. She is walking. Does have some numbness and tingling. REVIEW OF SYSTEMS: Positive for cataracts; hearing loss; inability to smell, which is mild; sinus problems; pneumonia; urinary tract infections; arthritis; leg weakness; and back pain; and thyroid disease. MEDICATIONS: Include the following: Tylenol #3, Elavil, Vitamin D, Colace, Neurontin, Glucagon, Synthroid, Lidoderm, Tradjenta, Prinivil, Prilosec, Novolin insulin, Humulin insulin, MiraLAX, Vitamin B6, Vitamin B2, Senokot, Zocor, Cranberry p.o. also.  She also takes Linagliptin, Lisinopril, Omeprazole, Polyethylene glycol, Pyridoxine, Riboflavin, Senna-Docusate, Simvastatin. EXAM: General: She is alert, oriented by 4, answering all questions appropriately. Memory, language, attention span, and fund of knowledge are normal. Hearing  diminished to voice. Extremities: 5/5 strength in the upper and lower extremities. 2+ reflexes in the upper and lower extremities.  MRI shows fairly severe stenosis at L4-5, foraminal narrowing and central stenosis at L3-4, some mild foraminal narrowing on the left side at L5-S1. Rest of the spine looks good. The conus is normal. Cauda equina is normal.  I do believe she would be helped for the left lower extremity pain with a decompression of the spinal canal on the left side at 3-4 bilaterally, 4-5 if need be, and possibly 5-1, though I am not sure if 5-1 is relevant. She would like to undergo operative decompression. We will get her scheduled. I would expect a simple lumbar decompression, no fusion, no screws, no hardware. Risks and benefits of bleeding, infection, no relief, need for further surgery, CSF leak, and other risks. She understands and wishes to proceed. We will get this scheduled as soon as we can.

## 2016-08-10 NOTE — Progress Notes (Signed)
Patient ID: [name], female   DOB: 01/31/1928, 83 y.o.   MRN: [id number] BP 92/78 (BP Location: Left Arm)   Pulse 95   Temp 99.2 F (37.3 C) (Oral)   Resp 18   Wt 71.7 kg (158 lb)   SpO2 95%   BMI 25.50 kg/m  Alert and oriented Blood sugar is over 400, nurse is not allowed to give more than 5 units, because sliding scale for correction can only be used during daytime meals. I have spoken with the pharmacist who was willing to help and will allow for more than 5 units of insulin to be given at night.

## 2016-08-11 DIAGNOSIS — M48062 Spinal stenosis, lumbar region with neurogenic claudication: Secondary | ICD-10-CM | POA: Diagnosis not present

## 2016-08-11 LAB — GLUCOSE, CAPILLARY
GLUCOSE-CAPILLARY: 154 mg/dL — AB (ref 65–99)
Glucose-Capillary: 200 mg/dL — ABNORMAL HIGH (ref 65–99)

## 2016-08-11 MED ORDER — ALUM & MAG HYDROXIDE-SIMETH 200-200-20 MG/5ML PO SUSP
15.0000 mL | ORAL | Status: DC | PRN
  Administered 2016-08-11: 15 mL via ORAL
  Filled 2016-08-11: qty 30

## 2016-08-11 MED ORDER — ACETAMINOPHEN-CODEINE #3 300-30 MG PO TABS: 1.0000 | ORAL_TABLET | Freq: Four times a day (QID) | ORAL | 0 refills | Status: DC | PRN

## 2016-08-11 MED ORDER — HYDROCODONE-ACETAMINOPHEN 5-325 MG PO TABS: 1.0000 | ORAL_TABLET | Freq: Four times a day (QID) | ORAL | 0 refills | Status: DC | PRN

## 2016-08-11 NOTE — Evaluation (Signed)
Occupational Therapy Evaluation and Defer further therapy to [facility] Patient Details Name: Amy Calhoun MRN: [id number] DOB: 1928-01-31 Today's Date: 08/11/2016    History of Present Illness 83 y.o. female admitted on 08/10/16 s/p laminectomy and decompression L3-S1.  Pt with significant PMH of DM, HTN, and CKD.     Clinical Impression   PTA Pt modified independent in ADL and mobility with quad cane and scooter. Pt is currently mod A to maintain back precautions during LB ADL, and min guard for mobility with RW. Back handout provided and reviewed adls in detail. Pt educated on: set an alarm at night for medication, avoid sitting for long periods of time, correct bed positioning for sleeping, correct sequence for bed mobility, AE (grabber/reacher, long handle sponge); avoiding lifting more than 5 pounds and never wash directly over incision. Pt will have excellent 24 hour support from daughters and independent living facility upon dc. Pt will require HHOT upon dc to maximize safety and independent and to REINFORCE back precautions during ADL and functional transfers.     Follow Up Recommendations  Home health OT;Supervision/Assistance - 24 hour (initially)    Equipment Recommendations  3 in 1 bedside commode;Other (comment) (grabber/reacher and long handle sponge)    Recommendations for Other Services       Precautions / Restrictions Precautions Precautions: Back Precaution Booklet Issued: Yes (comment) Precaution Comments: handout given by OT, reviewed with daughter and pt Required Braces or Orthoses:  (none) Restrictions Weight Bearing Restrictions: No      Mobility Bed Mobility Overal bed mobility: Needs Assistance Bed Mobility: Sit to Sidelying Rolling: Modified independent (Device/Increase time) Sidelying to sit: Supervision     Sit to sidelying: Min guard General bed mobility comments: More assist to correctly preform reverse log roll technique than to physically assist  pt back into bed.   Transfers Overall transfer level: Needs assistance Equipment used: Rolling walker (2 wheeled);1 person hand held assist Transfers: Sit to/from [employer] Sit to Stand: Min assist;Min guard Stand pivot transfers: Min assist;Min guard       General transfer comment: with RW pt is min guard assist for safety and balance during transitions.  Without RW pt is min hand held assist.     Balance Overall balance assessment: Needs assistance Sitting-balance support: Feet supported;No upper extremity supported Sitting balance-Leahy Scale: Good     Standing balance support: No upper extremity supported;Bilateral upper extremity supported;Single extremity supported Standing balance-Leahy Scale: Fair                             ADL either performed or assessed with clinical judgement   ADL Overall ADL's : Needs assistance/impaired Eating/Feeding: Modified independent;Sitting   Grooming: Supervision/safety;Cueing for safety;Standing   Upper Body Bathing: Minimal assistance;With caregiver independent assisting;Sitting   Lower Body Bathing: Moderate assistance;With caregiver independent assisting;With adaptive equipment;Sitting/lateral leans Lower Body Bathing Details (indicate cue type and reason): educated in long handle sponge Upper Body Dressing : Set up;With caregiver independent assisting;Sitting   Lower Body Dressing: Moderate assistance;With caregiver independent assisting;Sit to/from stand Lower Body Dressing Details (indicate cue type and reason): educated in Biochemist, clinical: Supervision/safety;Ambulation;RW Toilet Transfer Details (indicate cue type and reason): educated Pt in compensatory strategies, turn all the way around to flush, and no twisting for rear peri care Toileting- Clothing Manipulation and Hygiene: Supervision/safety;Sit to/from stand   Tub/ Shower Transfer: Walk-in shower;Min guard;With caregiver  independent assisting;Ambulation;Shower seat;Grab Designer, jewellery  Details (indicate cue type and reason): talked extensively about shower safety. Pt is fearful of falling in the shower - has non-slip surface, seat, extra grab bars, etc. will have supervision from daughter Functional mobility during ADLs: Min guard;Rolling walker       Vision Patient Visual Report: No change from baseline       Perception     Praxis      Pertinent Vitals/Pain Pain Assessment: Faces Faces Pain Scale: Hurts even more Pain Location: low back and left hip Pain Descriptors / Indicators: Aching;Burning Pain Intervention(s): Limited activity within patient's tolerance;Monitored during session;Repositioned     Hand Dominance Right   Extremity/Trunk Assessment Upper Extremity Assessment Upper Extremity Assessment: Defer to OT evaluation   Lower Extremity Assessment Lower Extremity Assessment: LLE deficits/detail LLE Deficits / Details: left leg with some posterior hip nerve pain similar to the pain PTA, educated pt and daughter that this is normal post surgery due to swelling after the procedure.  More healing will need to occur to know if the pain will receed LLE Sensation: decreased light touch   Cervical / Trunk Assessment Cervical / Trunk Assessment: Other exceptions Cervical / Trunk Exceptions: pt now s/p lumbar surgery   Communication Communication Communication: HOH   Cognition Arousal/Alertness: Awake/alert Behavior During Therapy: WFL for tasks assessed/performed Overall Cognitive Status: Within Functional Limits for tasks assessed                                     General Comments  Pt's daughter present for session    Exercises     Shoulder Instructions      Home Living Family/patient expects to be discharged to:: Other (Comment) (independent living apartment - [facility]) Living Arrangements: Alone Available Help at Discharge:  Family;Available 24 hours/day Type of Home: Independent living facility Home Access: Level entry     Home Layout: One level     Bathroom Shower/Tub: Producer, television/film/videoWalk-in shower   Bathroom Toilet: Handicapped height Bathroom Accessibility: Yes How Accessible: Accessible via walker Home Equipment: Cane - quad;Electric scooter;Shower seat - built in   Additional Comments: On her bad days pt uses scooter in her apartment and to get to the dining hall      Prior Functioning/Environment Level of Independence: Independent with assistive device(s)        Comments: quad cane, and uses scooter to get to dining hall        OT Problem List: Decreased activity tolerance;Impaired balance (sitting and/or standing);Decreased safety awareness;Decreased knowledge of use of DME or AE;Decreased knowledge of precautions;Pain      OT Treatment/Interventions:      OT Goals(Current goals can be found in the care plan section) Acute Rehab OT Goals Patient Stated Goal: to go home OT Goal Formulation: With patient/family Time For Goal Achievement: 08/25/16 Potential to Achieve Goals: Good  OT Frequency:     Barriers to D/C:            Co-evaluation              AM-PAC PT "6 Clicks" Daily Activity     Outcome Measure Help from another person eating meals?: None Help from another person taking care of personal grooming?: A Little Help from another person toileting, which includes using toliet, bedpan, or urinal?: A Little Help from another person bathing (including washing, rinsing, drying)?: A Lot Help from another person to put on and taking off  regular upper body clothing?: A Little Help from another person to put on and taking off regular lower body clothing?: A Lot 6 Click Score: 17   End of Session Equipment Utilized During Treatment: Rolling walker;Gait belt Nurse Communication: Mobility status;Precautions  Activity Tolerance: Patient tolerated treatment well Patient left: Other  (comment) (in hall ambulating with PT)  OT Visit Diagnosis: Unsteadiness on feet (R26.81);Pain Pain - Right/Left: Left Pain - part of body: Hip (back)                Time: [id number] OT Time Calculation (min): 20 min Charges:  OT General Charges $OT Visit: 1 Procedure OT Evaluation $OT Eval Moderate Complexity: 1 Procedure G-Codes: OT G-codes **NOT FOR INPATIENT CLASS** Functional Assessment Tool Used: AM-PAC 6 Clicks Daily Activity Functional Limitation: Self care Self Care Current Status ([id number]): At least 40 percent but less than 60 percent impaired, limited or restricted Self Care Goal Status ([id number](G8988): At least 1 percent but less than 20 percent impaired, limited or restricted Self Care Discharge Status [phone]): At least 40 percent but less than 60 percent impaired, limited or restricted   Sherryl MangesLaura Kloe Oates OTR/L [phone]  Evern BioLaura J Tayelor Osborne 08/11/2016, 12:19 PM

## 2016-08-11 NOTE — Discharge Summary (Signed)
Physician Discharge Summary  Patient ID: Amy BeamRosetta Mae Calhoun MRN: [id number] DOB/AGE: 81/01/1928 81 y.o.  Admit date: 08/10/2016 Discharge date: 08/11/2016  Admission Diagnoses:lumbar stenosis with neurogenic claudication L3/4,4/5, left 5/1  Discharge Diagnoses: same Active Problems:   Lumbar stenosis with neurogenic claudication   Discharged Condition: good  Hospital Course: [name] was admitted and taken to the operating room for an uncomplicated lumbar decompression. Post op she is ambulating, voiding, and tolerating a regular diet. Her wound is clean, dry, and without signs of infection. She will be discharged with home health pt  Treatments: surgery: BILATERAL HEMILAMINECTOMY LUMBAR THREE-FOUR,LUMBAR FOUR-FIVE,LEFT LUMBAR FIVE-SACRAL ONE HEMILAMINECTOMY AND DECOMPRESSION for decompression of the L3,4,5,and left S1 roots     Discharge Exam: Blood pressure (!) 143/48, pulse 84, temperature 98.7 F (37.1 C), temperature source Oral, resp. rate 18, weight 71.7 kg (158 lb), SpO2 100 %. General appearance: alert, cooperative, appears stated age and mild distress Neurologic: Alert and oriented X 3, normal strength and tone. Normal symmetric reflexes. Normal coordination and gait  Disposition: 01-Home or Self Care LUMBAR STENOSIS WITH NEUROGENIC CLAUDICATION  Allergies as of 08/11/2016      Reactions   Pravastatin Other (See Comments)   States she refused due to side effects      Medication List    TAKE these medications   acetaminophen-codeine 300-30 MG tablet Commonly known as:  TYLENOL #3 Take 1 tablet by mouth every 6 (six) hours as needed for moderate pain. What changed:  Another medication with the same name was added. Make sure you understand how and when to take each.   acetaminophen-codeine 300-30 MG tablet Commonly known as:  TYLENOL #3 Take 1 tablet by mouth every 6 (six) hours as needed for moderate pain. What changed:  You were already taking a medication with  the same name, and this prescription was added. Make sure you understand how and when to take each.   acidophilus Caps capsule Take 1 capsule by mouth daily.   albuterol (2.5 MG/3ML) 0.083% nebulizer solution Commonly known as:  PROVENTIL Take 3 mLs (2.5 mg total) by nebulization every 6 (six) hours as needed for wheezing or shortness of breath.   albuterol 108 (90 Base) MCG/ACT inhaler Commonly known as:  PROVENTIL HFA;VENTOLIN HFA Inhale 2 puffs into the lungs every 6 (six) hours as needed for wheezing or shortness of breath.   amitriptyline 75 MG tablet Commonly known as:  ELAVIL TAKE 2 TABLETS AT BEDTIME   CALCIUM + D PO Take 1 tablet by mouth daily.   Co Q 10 100 MG Caps Take 1 capsule by mouth daily.   CRANBERRY PO Take 1 tablet by mouth every Monday, Wednesday, and Friday.   docusate sodium 100 MG capsule Commonly known as:  COLACE Take 1 capsule (100 mg total) by mouth 2 (two) times daily.   gabapentin 100 MG capsule Commonly known as:  NEURONTIN Take 2 capsules (200 mg total) by mouth 3 (three) times daily.   glucagon 1 MG injection Commonly known as:  GLUCAGON EMERGENCY Inject 1 mg into the vein once as needed. For hypoglycemic episodes. E11.42   HYDROcodone-acetaminophen 5-325 MG tablet Commonly known as:  NORCO/VICODIN Take 1 tablet by mouth every 6 (six) hours as needed for moderate pain.   ibuprofen 200 MG tablet Commonly known as:  ADVIL,MOTRIN Take 800-1,000 mg by mouth 2 (two) times daily.   insulin NPH Human 100 UNIT/ML injection Commonly known as:  HUMULIN N,NOVOLIN N 20 units every morning and 10  units at night   insulin regular 100 units/mL injection Commonly known as:  NOVOLIN R,HUMULIN R 3 units before breakfast , 6 units before lunch , 6 units before dinner Please dispense 90 day supply   levothyroxine 150 MCG tablet Commonly known as:  SYNTHROID, LEVOTHROID Take 1 tablet (150 mcg total) by mouth daily.   lidocaine 5 % Commonly known  as:  LIDODERM PLACE 1 PATCH ON THE SKIN DAILY   linagliptin 5 MG Tabs tablet Commonly known as:  TRADJENTA Take 1 tablet (5 mg total) by mouth daily.   lisinopril 10 MG tablet Commonly known as:  PRINIVIL,ZESTRIL Take 1 tablet (10 mg total) by mouth daily.   omeprazole 40 MG capsule Commonly known as:  PRILOSEC Take 1 capsule (40 mg total) by mouth daily.   pyridOXINE 100 MG tablet Commonly known as:  VITAMIN B-6 Take 100 mg by mouth daily.   riboflavin 100 MG Tabs tablet Commonly known as:  VITAMIN B-2 Take 100 mg by mouth daily.   senna-docusate 8.6-50 MG tablet Commonly known as:  Senokot-S Take 1 tablet by mouth at bedtime.   simvastatin 40 MG tablet Commonly known as:  ZOCOR Take 40 mg by mouth at bedtime.            Durable Medical Equipment        Start     Ordered   08/11/16 1152  For home use only DME 3 n 1  Once     08/11/16 1156     Follow-up Information    Coletta Memosabbell, Jarion Hawthorne, MD Follow up in 3 week(s).   Specialty:  Neurosurgery Why:  please call the office to make an appointment Contact information: [address] [place] [address] [phone]           Signed: Carmela HurtCABBELL,Jason Frisbee L 08/11/2016, 11:57 AM

## 2016-08-11 NOTE — Progress Notes (Signed)
Patient is being discharged home. Discharge instructions were given to patient and family 

## 2016-08-11 NOTE — Progress Notes (Signed)
Patient ID: Amy Calhoun, female   DOB: 05/24/1928, 83 y.o.   MRN: [id number] Subjective: Patient reports back soreness no leg pain or NTW  Objective: Vital signs in last 24 hours: Temp:  [97.1 F (36.2 C)-99.2 F (37.3 C)] 98.7 F (37.1 C) (07/28 0522) Pulse Rate:  [84-103] 86 (07/28 0522) Resp:  [12-18] 18 (07/28 0522) BP: (92-158)/(48-83) 117/48 (07/28 0522) SpO2:  [92 %-99 %] 92 % (07/28 0522)  Intake/Output from previous day: 07/27 0701 - 07/28 0700 In: 1953 [I.V.:1803] Out: 1225 [Urine:1075; Blood:150] Intake/Output this shift: No intake/output data recorded.  Neurologic: Grossly normal, walking well  Lab Results: Lab Results  Component Value Date   WBC 8.6 08/07/2016   HGB 12.0 08/07/2016   HCT 38.6 08/07/2016   MCV 88.5 08/07/2016   PLT 214 08/07/2016   No results found for: INR, PROTIME BMET Lab Results  Component Value Date   NA 141 08/07/2016   K 4.9 08/07/2016   CL 105 08/07/2016   CO2 26 08/07/2016   GLUCOSE 195 (H) 08/07/2016   BUN 21 (H) 08/07/2016   CREATININE 1.40 (H) 08/07/2016   CALCIUM 8.7 (L) 08/07/2016    Studies/Results: Dg Lumbar Spine 2-3 Views  Result Date: 08/10/2016 CLINICAL DATA:  L3-L4 laminectomy and foraminotomy EXAM: LUMBAR SPINE - 2-3 VIEW COMPARISON:  Lumbar spine MRI - 06/20/2016 FINDINGS: 2 spot intraoperative fluoroscopic images of the lower lumbar spine a provided for review. Spinal labeling is in keeping with preprocedural lumbar spine MRI. Radiograph labeled #1 demonstrates a radiopaque needle tip overlying the soft tissues posterior to the L3-L4 intervertebral disc space. Radiograph labeled #2 demonstrates a radiopaque marking instrument overlying the soft tissues posterior to the L4-L5 intervertebral disc space. Additional radiopaque surgical support apparatus is noted posterior to the operative site. IMPRESSION: Intraoperative spinal localization as above. Electronically Signed   By: Simonne ComeJohn  Watts M.D.   On: 08/10/2016  12:15    Assessment/Plan: Seems to be doing well   LOS: 1 day    Amy Calhoun 08/11/2016, 9:07 AM

## 2016-08-11 NOTE — Care Management Obs Status (Signed)
MEDICARE OBSERVATION STATUS NOTIFICATION   Patient Details  Name: Amy BeamRosetta Mae Gum MRN: [id number] Date of Birth: 09/04/1928   Medicare Observation Status Notification Given:  Yes    Yves DillJeffries, Kiwanna Spraker Christine, RN 08/11/2016, 12:38 PM

## 2016-08-11 NOTE — Care Management CC44 (Signed)
Condition Code 44 Documentation Completed  Patient Details  Name: Melvenia BeamRosetta Mae Raker MRN: [id number] Date of Birth: 12/17/1928   Condition Code 44 given:  Yes Patient signature on Condition Code 44 notice:  Yes Documentation of 2 MD's agreement:  Yes Code 44 added to claim:  Yes    Yves DillJeffries, Jae Bruck Christine, RN 08/11/2016, 12:38 PM

## 2016-08-11 NOTE — Care Management Note (Signed)
Case Management Note  Patient Details  Name: Salwa Bai MRN: [id number] Date of Birth: Mar 13, 1928  Subjective/Objective:                  BILATERAL HEMILAMINECTOMY LUMBAR Action/Plan: Discharge planning Expected Discharge Date:  08/11/16               Expected Discharge Plan:  [place]  In-House Referral:     Discharge planning Services  CM Consult  Post Acute Care Choice:  Durable Medical Equipment Choice offered to:  Patient  DME Arranged:  3-N-1, Walker rolling DME Agency:  [place]:  PT [place]:  [facility] (now [facility])  Status of Service:  Completed, signed off  If discussed at [employer] of Stay Meetings, dates discussed:    Additional Comments: Cm met with pt and pt's daughter in room to offer choice of home health agency. Family chooses [facility].  Referral called to [facility] rep, [place] for [place].  CM notified [place] DME rep, Reggie to please deliver the 3n1 and rolling walker to room so pt can discharge. No other CM needs were communicated. Dellie Catholic, RN 08/11/2016, 12:51 PM

## 2016-08-11 NOTE — Progress Notes (Signed)
08/11/16 1145  PT Visit Information  Last PT Received On 08/11/16  Assistance Needed +1  History of Present Illness 83 y.o. female admitted on 08/10/16 s/p laminectomy and decompression L3-S1.  Pt with significant PMH of DM, HTN, and CKD.    Precautions  Precautions Back  Precaution Booklet Issued Yes (comment)  Precaution Comments handout given by OT, reviewed with daughter and pt  Required Braces or Orthoses (none)  Home Living  Family/patient expects to be discharged to: Other (Comment) (independent living apartment - [facility])  Living Arrangements Alone  Available Help at Discharge Family;Available 24 hours/day  Type of Home Independent living facility  Home Access Level entry  Home Layout One level  Bathroom Shower/Tub Walk-in shower  Bathroom Toilet Handicapped height  Bathroom Accessibility Yes  Home Equipment Cane - quad;Electric scooter;Shower seat - built in  Additional Comments On her bad days pt uses scooter in her apartment and to get to the dining hall  Prior Function  Level of Independence Independent with assistive device(s)  Comments quad cane, and uses scooter to get to dining hall  Communication  Communication HOH  Pain Assessment  Pain Assessment Faces  Faces Pain Scale 6  Pain Location low back and left hip  Pain Descriptors / Indicators Aching;Burning  Pain Intervention(s) Limited activity within patient's tolerance;Monitored during session;Repositioned  Cognition  Arousal/Alertness Awake/alert  Behavior During Therapy WFL for tasks assessed/performed  Overall Cognitive Status Within Functional Limits for tasks assessed  Upper Extremity Assessment  Upper Extremity Assessment Defer to OT evaluation  Lower Extremity Assessment  Lower Extremity Assessment LLE deficits/detail  LLE Deficits / Details left leg with some posterior hip nerve pain similar to the pain PTA, educated pt and daughter that this is normal post surgery due to swelling after the  procedure.  More healing will need to occur to know if the pain will receed  LLE Sensation decreased light touch  Cervical / Trunk Assessment  Cervical / Trunk Assessment Other exceptions  Cervical / Trunk Exceptions pt now s/p lumbar surgery  Bed Mobility  Overal bed mobility Needs Assistance  Bed Mobility Sit to Sidelying  Sit to sidelying Min guard  General bed mobility comments More assist to correctly preform reverse log roll technique than to physically assist pt back into bed.   Transfers  Overall transfer level Needs assistance  Equipment used Rolling walker (2 wheeled);1 person hand held assist  Transfers Sit to/from BJ'sStand;Stand Pivot Transfers  Sit to Stand Min assist;Min guard  Stand pivot transfers Min assist;Min guard  General transfer comment with RW pt is min guard assist for safety and balance during transitions.  Without RW pt is min hand held assist.   Ambulation/Gait  Ambulation/Gait assistance Min guard;Min assist  Ambulation Distance (Feet) 150 Feet  Assistive device Rolling walker (2 wheeled);1 person hand held assist  Gait Pattern/deviations Step-through pattern;Staggering left  General Gait Details Pt with staggering gait pattern, improved with RW use, however, daughter reports she won't comply with RW use at home.  Daughters to be there for the next week and a few days to help out.    Balance  Overall balance assessment Needs assistance  Sitting-balance support Feet supported;No upper extremity supported  Sitting balance-Leahy Scale Good  Standing balance support No upper extremity supported;Bilateral upper extremity supported;Single extremity supported  Standing balance-Leahy Scale Fair  PT - End of Session  Equipment Utilized During Treatment Gait belt  Activity Tolerance Patient limited by pain  Patient left in bed;with call  bell/phone within reach;with family/visitor present  PT Assessment  PT Recommendation/Assessment Patient needs continued PT services   PT Visit Diagnosis Muscle weakness (generalized) (M62.81);Difficulty in walking, not elsewhere classified (R26.2);Pain  Pain - Right/Left Left  Pain - part of body Hip (and low back)  PT Problem List Decreased strength;Decreased activity tolerance;Decreased balance;Decreased mobility;Decreased knowledge of use of DME;Pain  PT Plan  PT Frequency (ACUTE ONLY) Min 5X/week  PT Treatment/Interventions (ACUTE ONLY) DME instruction;Gait training;Functional mobility training;Therapeutic activities;Therapeutic exercise;Balance training;Patient/family education  AM-PAC PT "6 Clicks" Daily Activity Outcome Measure  Difficulty turning over in bed (including adjusting bedclothes, sheets and blankets)? 4  Difficulty moving from lying on back to sitting on the side of the bed?  4  Difficulty sitting down on and standing up from a chair with arms (e.g., wheelchair, bedside commode, etc,.)? 1  Help needed moving to and from a bed to chair (including a wheelchair)? 3  Help needed walking in hospital room? 3  Help needed climbing 3-5 steps with a railing?  3  6 Click Score 18  Mobility G Code  CK  PT Recommendation  Follow Up Recommendations Home health PT  PT equipment 3in1 (PT)  Individuals Consulted  Consulted and Agree with Results and Recommendations Patient;Family member/caregiver  Family Member Consulted daughter  Acute Rehab PT Goals  Patient Stated Goal to go home  PT Goal Formulation With patient/family  Time For Goal Achievement 08/18/16  Potential to Achieve Goals Good  PT Time Calculation  PT Start Time (ACUTE ONLY) 1145  PT Stop Time (ACUTE ONLY) 1203  PT Time Calculation (min) (ACUTE ONLY) 18 min  PT G-Codes **NOT FOR INPATIENT CLASS**  Functional Assessment Tool Used AM-PAC 6 Clicks Basic Mobility  Functional Limitation Mobility: Walking and moving around  Mobility: Walking and Moving Around Current Status (Z6109(G8978) CK  Mobility: Walking and Moving Around Goal Status (U0454(G8979) CI  PT  General Charges  $$ ACUTE PT VISIT 1 Procedure  PT Evaluation  $PT Eval Moderate Complexity 1 Procedure  Written Expression  Dominant Hand Right  08/11/2016 Pt is able to safely mobilize with min guard to min assist for balance.  She would be safe for general mobility to use RW, however, daughter reports she has never been compliant with RW use and instead uses her scooter for safety when she is not feeling well or in pain.  HHPT may help address balance issues and help encourage a more active lifestyle.  Pt's daughters are planning on staying with her for the next week and a few days.   PT to follow acutely for deficits listed above.    Rollene Rotundaebecca B. Mel Tadros, PT, DPT [phone]

## 2016-08-12 ENCOUNTER — Telehealth: Payer: Self-pay | Admitting: *Deleted

## 2016-08-13 ENCOUNTER — Encounter (HOSPITAL_COMMUNITY): Payer: Self-pay | Admitting: Neurosurgery

## 2016-08-13 ENCOUNTER — Telehealth: Payer: Self-pay | Admitting: Family Medicine

## 2016-08-13 DIAGNOSIS — R269 Unspecified abnormalities of gait and mobility: Secondary | ICD-10-CM | POA: Diagnosis not present

## 2016-08-13 DIAGNOSIS — M48062 Spinal stenosis, lumbar region with neurogenic claudication: Secondary | ICD-10-CM | POA: Diagnosis not present

## 2016-08-13 DIAGNOSIS — Z4789 Encounter for other orthopedic aftercare: Secondary | ICD-10-CM | POA: Diagnosis not present

## 2016-08-13 DIAGNOSIS — M6281 Muscle weakness (generalized): Secondary | ICD-10-CM | POA: Diagnosis not present

## 2016-08-13 DIAGNOSIS — H9190 Unspecified hearing loss, unspecified ear: Secondary | ICD-10-CM | POA: Diagnosis not present

## 2016-08-13 DIAGNOSIS — M1991 Primary osteoarthritis, unspecified site: Secondary | ICD-10-CM | POA: Diagnosis not present

## 2016-08-13 DIAGNOSIS — E119 Type 2 diabetes mellitus without complications: Secondary | ICD-10-CM | POA: Diagnosis not present

## 2016-08-13 NOTE — Telephone Encounter (Signed)
Amy Calhoun from [facility] called and stated that pt had a fall on Saturday and had gone to the ED. Also he said the frequency of patient's physical therapy would be 2x for 4 weeks. Please advise, thank you!  Call [facility] @ [phone]

## 2016-08-13 NOTE — Telephone Encounter (Signed)
Okay with me 

## 2016-08-13 NOTE — Telephone Encounter (Signed)
Maurine Ministerennis from [facility] called needing verification on some medications : Tylenol # 3  Has it on her medication list twice, albuterol nebulizer solution (pt has inhaler and has never used the nebulaizer.) Please advise, thank you!  Call [facility] @ [phone]

## 2016-08-14 NOTE — Telephone Encounter (Signed)
Arman BogusAdvised Dennis per to discontinue one of the Tylenol #3  orders and discontinue albuterol nebulizer solution orders per verbal orders from Dr Adriana Simasook

## 2016-08-14 NOTE — Telephone Encounter (Signed)
Spoke with Maurine Ministerennis at [facility] and given verbal as listed below

## 2016-08-21 ENCOUNTER — Ambulatory Visit (INDEPENDENT_AMBULATORY_CARE_PROVIDER_SITE_OTHER): Payer: Medicare HMO | Admitting: Sports Medicine

## 2016-08-21 ENCOUNTER — Encounter: Payer: Self-pay | Admitting: Sports Medicine

## 2016-08-21 VITALS — BP 130/60 | HR 93 | Ht 66.0 in | Wt 164.0 lb

## 2016-08-21 DIAGNOSIS — M4726 Other spondylosis with radiculopathy, lumbar region: Secondary | ICD-10-CM | POA: Diagnosis not present

## 2016-08-21 NOTE — Progress Notes (Signed)
OFFICE VISIT NOTE [name] at [facility] [phone]  Amy Calhoun - 83 y.o. female MRN [id number]  Date of birth: 1928-12-27  Visit Date: 08/21/2016  PCP: Allegra Grana, FNP   Referred by: Tommie Sams, DO  Orlie Dakin, CMA acting as scribe for Dr. Berline Chough.  SUBJECTIVE:   Chief Complaint  Patient presents with  . Osteoarthritis of L-Spine   HPI: As below and per problem based documentation when appropriate.   Amy Calhoun is an established patient who presents today in follow-up of osteoarthritis of the lumbar spine. She had surgery 08/10/2016 with Dr. Franky Macho, bilateral hemilaminectomy L3-L4, L4-L5, and left L5-S1. Today she rates her pain at 5 out of 10. She is alternating Norvo/Vicodin, Tylenol #3 and taking Tylenol 800mg  to 1g bid. The pain is persistent in the lower alternating radiation down Lt and RT leg. PT is coming to the home Monday's and Thursday's. The day she came home from the Hospital she bend over to pick up a piece and fell over on her head. She is forgetting that she has a bedside commode and has crawled to the bathroom.  The incision site does have increased redness that daughter wants looked at today. Neosporin has been applied.     Review of Systems  Constitutional: Negative for chills, diaphoresis, fever, malaise/fatigue and weight loss.  HENT: Negative.   Eyes: Negative.   Respiratory: Negative.   Cardiovascular: Negative.   Gastrointestinal: Negative.   Genitourinary: Negative.   Musculoskeletal: Positive for back pain, falls, joint pain and myalgias. Negative for neck pain.  Skin: Negative for itching and rash.  Neurological: Negative.  Negative for weakness.  Endo/Heme/Allergies: Negative for environmental allergies and polydipsia. Does not bruise/bleed easily.  Psychiatric/Behavioral: Negative.     Otherwise per HPI.  HISTORY & PERTINENT PRIOR DATA:  No specialty comments  available. She reports that she has never smoked. She has never used smokeless tobacco.   Recent Labs  01/19/16 1506 03/30/16 0447 07/06/16 0924  HGBA1C 8.9* 7.4* 6.9*   Medications & Allergies reviewed per EMR Patient Active Problem List   Diagnosis Date Noted  . Lumbar stenosis with neurogenic claudication 08/10/2016  . Acute respiratory failure with hypoxemia (HCC) 07/12/2016  . Community acquired pneumonia 07/12/2016  . Anemia 07/06/2016  . Osteoarthritis of spine with radiculopathy, lumbar region 03/16/2016  . History of colitis 03/02/2016  . Primary osteoarthritis of left hip 03/02/2016  . CKD (chronic kidney disease) stage 3, GFR 30-59 ml/min 02/21/2016  . DM type 2 with diabetic peripheral neuropathy (HCC) 01/19/2016  . Hyperlipidemia 01/19/2016  . Hypothyroidism 01/19/2016  . Essential hypertension 01/19/2016  . Chronic pain 01/19/2016  . Esophageal dysmotility 12/21/2015   Past Medical History:  Diagnosis Date  . Chronic kidney disease 08/07/2016   Chronic renal failure, stage III  . Depression   . Esophageal dysphagia   . Gastric ulcer   . GERD (gastroesophageal reflux disease)   . Hyperlipidemia   . Hypertension   . Hypothyroidism   . IDDM (insulin dependent diabetes mellitus) (HCC)   . Ischemic colitis (HCC)   . Osteoarthritis   . Ulcer of esophagus    Family History  Problem Relation Age of Onset  . Diabetes Mother   . Arthritis Mother   . Hyperlipidemia Mother   . Mental illness Mother   . Heart disease Father   . Mental illness Sister   . Arthritis Maternal Grandmother   .  Arthritis Maternal Grandfather   . Colon cancer Neg Hx   . Stomach cancer Neg Hx   . Esophageal cancer Neg Hx    Past Surgical History:  Procedure Laterality Date  . ABDOMINAL HYSTERECTOMY    . ESOPHAGEAL DILATION  08/07/2016   usually a couple times a year, last time 07/30/16  . GALLBLADDER SURGERY    . LUMBAR LAMINECTOMY/DECOMPRESSION MICRODISCECTOMY N/A 08/10/2016    Procedure: BILATERAL HEMILAMINECTOMY LUMBAR THREE-FOUR,LUMBAR FOUR-FIVE,LEFT LUMBAR FIVE-SACRAL ONE HEMILAMINECTOMY AND DECOMPRESSION;  Surgeon: Coletta Memosabbell, Kyle, MD;  Location: [facility] OR;  Service: Neurosurgery;  Laterality: N/A;  . THYROIDECTOMY     Social History   Occupational History  . retired    Social History Main Topics  . Smoking status: Never Smoker  . Smokeless tobacco: Never Used  . Alcohol use No  . Drug use: No  . Sexual activity: No    OBJECTIVE:  VS:  HT:5\' 6"  (167.6 cm)   WT:164 lb (74.4 kg)  BMI:26.5    BP:130/60  HR:93bpm  TEMP: ( )  RESP:  EXAM: Findings:  Elderly female.  No acute distress.  Alert and appropriate.  She continues to walk with a walker and has a 4 second sit to stand test.  Postsurgical incision is well-healed with a focal small area of erythema without fluctuance that is minimal.  No exudate.  No focal tenderness.  See attached him as below.  Minimal pain with straight leg raise bilaterally.  No pain with internal/external rotation of her hips.     No results found. ASSESSMENT & PLAN:     ICD-10-CM   1. Osteoarthritis of spine with radiculopathy, lumbar region M47.26   ================================================================= Osteoarthritis of spine with radiculopathy, lumbar region She is status post bilateral hemilaminectomy at L3-4, L4-5 and left-sided hemilaminectomy at L5-S1.  Dr. Mikal Planeabell is out of town and she is here for medication refill.>50% of this 25 minute visit spent in direct patient counseling and/or coordination of care.  Discussion was focused on education regarding the in discussing the pathoetiology and anticipated clinical course of the above condition.  Ultimately, she seems to be progressing in therapy in spite of having a fall last week.  She has not had any recurrent falls since that time.  Her daughters are here with her today and reported they feels that she is progressing as well.  They are trying to wean back on  pain medications and this was discussed with him.  I did provide him with 1 additional refill Tylenol 3 but this will be the last for my office and she should have any further pain management by Dr. Mikal Planeabell when he returns from being out of town.  Regarding the postsurgical incision this appears within normal limits and I discussed red flags.  Recommend avoiding  triple antibiotic ointment as she may be allergic to neomycin, okay to use Polysporin. If any further concerns I instructed them to call  [facility]. ================================================================= There are no Patient Instructions on file for this visit.=================================================================  Follow-up: Return if symptoms worsen or fail to improve.   CMA/ATC served as Neurosurgeonscribe during this visit. History, Physical, and Plan performed by medical provider. Documentation and orders reviewed and attested to.      Gaspar BiddingMichael Zendaya Groseclose, DO    [facility]

## 2016-08-29 DIAGNOSIS — R69 Illness, unspecified: Secondary | ICD-10-CM | POA: Diagnosis not present

## 2016-08-30 ENCOUNTER — Telehealth: Payer: Self-pay | Admitting: Sports Medicine

## 2016-08-30 NOTE — Telephone Encounter (Signed)
OK to refill

## 2016-08-30 NOTE — Telephone Encounter (Signed)
MEDICATION: acetaminophen-codeine (tylenol 3)  PHARMACY: [id number] [place] [address]   IS THIS A 90 DAY SUPPLY : no  IS PATIENT OUT OF MEDICTAION: no  IF NOT; HOW MUCH IS LEFT: 3 pills  LAST APPOINTMENT DATE: 08/07  NEXT APPOINTMENT DATE: n/a  OTHER COMMENTS:    **Let patient know to contact pharmacy at the end of the day to make sure medication is ready. **  ** Please notify patient to allow 48-72 hours to process**  **Encourage patient to contact the pharmacy for refills or they can request refills through [facility]**

## 2016-08-30 NOTE — Telephone Encounter (Signed)
Okay for 1 final refill since her Spine Surgeon is out of town.  Final refill for #30 but no further from me for this issue.  If they need ongoing pain management then this will need to be directed by Neurosurgeon

## 2016-08-31 ENCOUNTER — Other Ambulatory Visit: Payer: Self-pay

## 2016-08-31 DIAGNOSIS — M25552 Pain in left hip: Secondary | ICD-10-CM

## 2016-08-31 DIAGNOSIS — M4726 Other spondylosis with radiculopathy, lumbar region: Secondary | ICD-10-CM

## 2016-08-31 MED ORDER — ACETAMINOPHEN-CODEINE #3 300-30 MG PO TABS: 1.0000 | ORAL_TABLET | Freq: Four times a day (QID) | ORAL | 0 refills | Status: DC | PRN

## 2016-08-31 NOTE — Telephone Encounter (Signed)
[facility], Emma aware.

## 2016-09-03 ENCOUNTER — Other Ambulatory Visit: Payer: Self-pay | Admitting: Family Medicine

## 2016-09-04 ENCOUNTER — Telehealth: Payer: Self-pay | Admitting: *Deleted

## 2016-09-04 NOTE — Telephone Encounter (Signed)
Pt has requested a medication for amitriptyline  [facility]

## 2016-09-05 MED ORDER — AMITRIPTYLINE HCL 75 MG PO TABS: 150.0000 mg | ORAL_TABLET | Freq: Every day | ORAL | 0 refills | Status: DC

## 2016-09-09 NOTE — Assessment & Plan Note (Signed)
She is status post bilateral hemilaminectomy at L3-4, L4-5 and left-sided hemilaminectomy at L5-S1.  Dr. Mikal Plane is out of town and she is here for medication refill.>50% of this 25 minute visit spent in direct patient counseling and/or coordination of care.  Discussion was focused on education regarding the in discussing the pathoetiology and anticipated clinical course of the above condition.  Ultimately, she seems to be progressing in therapy in spite of having a fall last week.  She has not had any recurrent falls since that time.  Her daughters are here with her today and reported they feels that she is progressing as well.  They are trying to wean back on pain medications and this was discussed with him.  I did provide him with 1 additional refill Tylenol 3 but this will be the last for my office and she should have any further pain management by Dr. Mikal Plane when he returns from being out of town.  Regarding the postsurgical incision this appears within normal limits and I discussed red flags.  Recommend avoiding  triple antibiotic ointment as she may be allergic to neomycin, okay to use Polysporin. If any further concerns I instructed them to call  [facility].

## 2016-09-13 NOTE — Telephone Encounter (Signed)
Error

## 2016-09-20 ENCOUNTER — Other Ambulatory Visit: Payer: Self-pay | Admitting: Neurosurgery

## 2016-09-20 DIAGNOSIS — M48062 Spinal stenosis, lumbar region with neurogenic claudication: Secondary | ICD-10-CM

## 2016-10-01 ENCOUNTER — Telehealth: Payer: Self-pay | Admitting: Family

## 2016-10-01 DIAGNOSIS — N183 Chronic kidney disease, stage 3 unspecified: Secondary | ICD-10-CM

## 2016-10-01 NOTE — Telephone Encounter (Signed)
Pt daughter called and wanted to have labs done for pt's kidney function. Pt's surgeon has pt going to have and MRI later in the week and they are pushing to have contrast. Pt has an appt with M. Arnett on Wednesday. Please advise, thank you!  Call [facility] @ [phone]

## 2016-10-01 NOTE — Telephone Encounter (Signed)
Please advise 

## 2016-10-01 NOTE — Telephone Encounter (Signed)
Bmp ordered

## 2016-10-01 NOTE — Telephone Encounter (Signed)
Pt daughter has been notified and has scheduled appointment for tomorrow at four.

## 2016-10-02 ENCOUNTER — Other Ambulatory Visit (INDEPENDENT_AMBULATORY_CARE_PROVIDER_SITE_OTHER): Payer: Medicare HMO

## 2016-10-02 DIAGNOSIS — N183 Chronic kidney disease, stage 3 unspecified: Secondary | ICD-10-CM

## 2016-10-03 ENCOUNTER — Ambulatory Visit (INDEPENDENT_AMBULATORY_CARE_PROVIDER_SITE_OTHER): Payer: Medicare HMO | Admitting: Family

## 2016-10-03 ENCOUNTER — Encounter: Payer: Self-pay | Admitting: Family

## 2016-10-03 VITALS — BP 134/62 | HR 76 | Temp 97.6°F | Ht 66.0 in | Wt 161.2 lb

## 2016-10-03 DIAGNOSIS — N183 Chronic kidney disease, stage 3 unspecified: Secondary | ICD-10-CM

## 2016-10-03 DIAGNOSIS — E1142 Type 2 diabetes mellitus with diabetic polyneuropathy: Secondary | ICD-10-CM | POA: Diagnosis not present

## 2016-10-03 LAB — BASIC METABOLIC PANEL
BUN: 24 mg/dL — ABNORMAL HIGH (ref 6–23)
CO2: 26 meq/L (ref 19–32)
CREATININE: 1.13 mg/dL (ref 0.40–1.20)
Calcium: 9 mg/dL (ref 8.4–10.5)
Chloride: 107 mEq/L (ref 96–112)
GFR: 48.52 mL/min — ABNORMAL LOW (ref >60.00)
Glucose, Bld: 124 mg/dL — ABNORMAL HIGH (ref 70–99)
POTASSIUM: 4.9 meq/L (ref 3.5–5.1)
SODIUM: 141 meq/L (ref 135–145)

## 2016-10-03 LAB — POCT GLYCOSYLATED HEMOGLOBIN (HGB A1C): Hemoglobin A1C: 6.5

## 2016-10-03 NOTE — Assessment & Plan Note (Addendum)
Controlled. Will connect patient with pharmacist as she had been following in the past. Will also have Valinda Party reach out to patient for DM management. Will follow.

## 2016-10-03 NOTE — Progress Notes (Addendum)
Subjective:    Patient ID: Amy Calhoun, female    DOB: 1928/10/15, 83 y.o.   MRN: [id number]  CC: Amy Calhoun is a 84 y.o. female who presents today for follow up.   HPI: Here to discuss kidney function as neursurgeron wanted MRI lumbar with contrast to follow up on her surgery. Here today for results of kidney function as daughter is concerned about kidney function.   Low back pain initially hadn't  improved with surgery, however notes over the past week, pain has improved. Pain also improved with getting pool per daughter and hot shower. Still taking tylenol #3 and norco however less per daughter.   Using walker. Completed PT. Reports continued leg numbness in legs from DM however also notes numbness, pain on left side low back as had been prior to surgery.   DM- compliant with medications. Better control since seeing pharmacist. No lows     Seen by Dr. Berline Chough for  osteoporosis of the spine one month ago. Post lumbar surgery 07/2016. Receiving narcotics from, Dr Mikal Plane.   GFR 07/2016 was 39. , Crt 1.4 HISTORY:  Past Medical History:  Diagnosis Date  . Chronic kidney disease 08/07/2016   Chronic renal failure, stage III  . Depression   . Esophageal dysphagia   . Gastric ulcer   . GERD (gastroesophageal reflux disease)   . Hyperlipidemia   . Hypertension   . Hypothyroidism   . IDDM (insulin dependent diabetes mellitus) (HCC)   . Ischemic colitis (HCC)   . Osteoarthritis   . Ulcer of esophagus    Past Surgical History:  Procedure Laterality Date  . ABDOMINAL HYSTERECTOMY    . ESOPHAGEAL DILATION  08/07/2016   usually a couple times a year, last time 07/30/16  . GALLBLADDER SURGERY    . LUMBAR LAMINECTOMY/DECOMPRESSION MICRODISCECTOMY N/A 08/10/2016   Procedure: BILATERAL HEMILAMINECTOMY LUMBAR THREE-FOUR,LUMBAR FOUR-FIVE,LEFT LUMBAR FIVE-SACRAL ONE HEMILAMINECTOMY AND DECOMPRESSION;  Surgeon: Coletta Memos, MD;  Location: [facility] OR;  Service: Neurosurgery;  Laterality:  N/A;  . THYROIDECTOMY     Family History  Problem Relation Age of Onset  . Diabetes Mother   . Arthritis Mother   . Hyperlipidemia Mother   . Mental illness Mother   . Heart disease Father   . Mental illness Sister   . Arthritis Maternal Grandmother   . Arthritis Maternal Grandfather   . Colon cancer Neg Hx   . Stomach cancer Neg Hx   . Esophageal cancer Neg Hx     Allergies: Pravastatin Current Outpatient Prescriptions on File Prior to Visit  Medication Sig Dispense Refill  . acetaminophen-codeine (TYLENOL #3) 300-30 MG tablet Take 1 tablet by mouth every 6 (six) hours as needed for moderate pain. 30 tablet 0  . acidophilus (RISAQUAD) CAPS capsule Take 1 capsule by mouth daily.    Marland Kitchen albuterol (PROVENTIL HFA;VENTOLIN HFA) 108 (90 Base) MCG/ACT inhaler Inhale 2 puffs into the lungs every 6 (six) hours as needed for wheezing or shortness of breath. 1 Inhaler 2  . albuterol (PROVENTIL) (2.5 MG/3ML) 0.083% nebulizer solution Take 3 mLs (2.5 mg total) by nebulization every 6 (six) hours as needed for wheezing or shortness of breath. 75 mL 12  . amitriptyline (ELAVIL) 75 MG tablet Take 2 tablets (150 mg total) by mouth at bedtime. 90 tablet 0  . Calcium Citrate-Vitamin D (CALCIUM + D PO) Take 1 tablet by mouth daily.    . Coenzyme Q10 (CO Q 10) 100 MG CAPS Take 1 capsule by mouth  daily.    Marland Kitchen CRANBERRY PO Take 1 tablet by mouth every Monday, Wednesday, and Friday.    . docusate sodium (COLACE) 100 MG capsule Take 1 capsule (100 mg total) by mouth 2 (two) times daily. 10 capsule 0  . gabapentin (NEURONTIN) 100 MG capsule Take 2 capsules (200 mg total) by mouth 3 (three) times daily. 540 capsule 1  . glucagon (GLUCAGON EMERGENCY) 1 MG injection Inject 1 mg into the vein once as needed. For hypoglycemic episodes. E11.42 1 each 3  . HYDROcodone-acetaminophen (NORCO/VICODIN) 5-325 MG tablet Take 1 tablet by mouth every 6 (six) hours as needed for moderate pain. 28 tablet 0  . ibuprofen  (ADVIL,MOTRIN) 200 MG tablet Take 800-1,000 mg by mouth 2 (two) times daily.    . insulin NPH Human (HUMULIN N,NOVOLIN N) 100 UNIT/ML injection 20 units every morning and 10 units at night 30 mL 1  . insulin regular (NOVOLIN R,HUMULIN R) 100 units/mL injection 3 units before breakfast , 6 units before lunch , 6 units before dinner Please dispense 90 day supply 10 mL 0  . levothyroxine (SYNTHROID, LEVOTHROID) 150 MCG tablet Take 1 tablet (150 mcg total) by mouth daily. 90 tablet 3  . lidocaine (LIDODERM) 5 % PLACE 1 PATCH ON THE SKIN DAILY 90 patch 1  . linagliptin (TRADJENTA) 5 MG TABS tablet Take 1 tablet (5 mg total) by mouth daily. 90 tablet 1  . lisinopril (PRINIVIL,ZESTRIL) 10 MG tablet Take 1 tablet (10 mg total) by mouth daily. 90 tablet 3  . omeprazole (PRILOSEC) 40 MG capsule Take 1 capsule (40 mg total) by mouth daily. 90 capsule 3  . pyridOXINE (VITAMIN B-6) 100 MG tablet Take 100 mg by mouth daily.    . riboflavin (VITAMIN B-2) 100 MG TABS tablet Take 100 mg by mouth daily.    Marland Kitchen senna-docusate (SENOKOT-S) 8.6-50 MG tablet Take 1 tablet by mouth at bedtime. 30 tablet 5  . simvastatin (ZOCOR) 40 MG tablet Take 40 mg by mouth at bedtime.      Current Facility-Administered Medications on File Prior to Visit  Medication Dose Route Frequency Provider Last Rate Last Dose  . 0.9 %  sodium chloride infusion  500 mL Intravenous Continuous Rachael Fee, MD        Social History  Substance Use Topics  . Smoking status: Never Smoker  . Smokeless tobacco: Never Used  . Alcohol use No    Review of Systems  Constitutional: Negative for chills and fever.  Respiratory: Negative for cough.   Cardiovascular: Negative for chest pain and palpitations.  Gastrointestinal: Negative for nausea and vomiting.  Musculoskeletal: Positive for back pain.  Neurological: Positive for numbness.      Objective:    BP 134/62   Pulse 76   Temp 97.6 F (36.4 C) (Oral)   Ht  (1.676 m)   Wt 161  lb 3.2 oz (73.1 kg)   SpO2 94%   BMI 26.02 kg/m  BP Readings from Last 3 Encounters:  10/03/16 134/62  08/21/16 130/60  08/11/16 (!) 143/48   Wt Readings from Last 3 Encounters:  10/03/16 161 lb 3.2 oz (73.1 kg)  08/21/16 164 lb (74.4 kg)  08/10/16 158 lb (71.7 kg)    Physical Exam  Constitutional: She appears well-developed and well-nourished.  Eyes: Conjunctivae are normal.  Cardiovascular: Normal rate, regular rhythm, normal heart sounds and normal pulses.   Pulmonary/Chest: Effort normal and breath sounds normal. She has no wheezes. She has no rhonchi. She  has no rales.  Neurological: She is alert.  Skin: Skin is warm and dry.  Psychiatric: She has a normal mood and affect. Her speech is normal and behavior is normal. Thought content normal.  Vitals reviewed.      Assessment & Plan:   Problem List Items Addressed This Visit      Endocrine   DM type 2 with diabetic peripheral neuropathy (HCC) - Primary    Controlled. Will connect patient with pharmacist as she had been following in the past. Will also have Valinda Party reach out to patient for DM management. Will follow.       Relevant Orders   POCT HgB A1C (Completed)     Genitourinary   CKD (chronic kidney disease) stage 3, GFR 30-59 ml/min    Advised to stop NSAIDs. Pending BMP results at this time. Advised daughter and patient to have further discussion with neurosurgeon regarding doing MRI w/o constrast and perhaps holding on MRI for a period of time since she is starting to see relief. Offered nephrology referral and daughter declines today. Will follow.           I am having Ms. Loyal maintain her simvastatin, lisinopril, omeprazole, pyridOXINE, riboflavin, glucagon, gabapentin, docusate sodium, senna-docusate, levothyroxine, linagliptin, insulin NPH Human, insulin regular, lidocaine, acidophilus, CRANBERRY PO, albuterol, albuterol, ibuprofen, Co Q 10, Calcium Citrate-Vitamin D (CALCIUM + D PO),  HYDROcodone-acetaminophen, acetaminophen-codeine, and amitriptyline. We will continue to administer sodium chloride.   No orders of the defined types were placed in this encounter.   Return precautions given.   Risks, benefits, and alternatives of the medications and treatment plan prescribed today were discussed, and patient expressed understanding.   Education regarding symptom management and diagnosis given to patient on AVS.  Continue to follow with Allegra Grana, FNP for routine health maintenance.   Adreanna Mae [facility] and I agreed with plan.   Rennie Plowman, FNP

## 2016-10-03 NOTE — Assessment & Plan Note (Signed)
Advised to stop NSAIDs. Pending BMP results at this time. Advised daughter and patient to have further discussion with neurosurgeon regarding doing MRI w/o constrast and perhaps holding on MRI for a period of time since she is starting to see relief. Offered nephrology referral and daughter declines today. Will follow.

## 2016-10-03 NOTE — Patient Instructions (Addendum)
Advise to stop all ibuprofen ( NSAIDS) due to kidney function.   Lets await kidney function since surgery  Let me know if you would like referral to nephrology.  Follow up one month

## 2016-10-07 ENCOUNTER — Ambulatory Visit
Admission: RE | Admit: 2016-10-07 | Discharge: 2016-10-07 | Disposition: A | Discharge status: Home / Self Care | Payer: Medicare HMO | Source: Ambulatory Visit | Attending: Neurosurgery | Admitting: Neurosurgery

## 2016-10-07 DIAGNOSIS — M48062 Spinal stenosis, lumbar region with neurogenic claudication: Secondary | ICD-10-CM

## 2016-10-07 DIAGNOSIS — M48061 Spinal stenosis, lumbar region without neurogenic claudication: Secondary | ICD-10-CM | POA: Diagnosis not present

## 2016-10-07 MED ORDER — GADOBENATE DIMEGLUMINE 529 MG/ML IV SOLN
8.0000 mL | Freq: Once | INTRAVENOUS | Status: AC | PRN
  Administered 2016-10-07: 8 mL via INTRAVENOUS

## 2016-10-08 ENCOUNTER — Ambulatory Visit: Payer: Self-pay | Admitting: Pharmacist

## 2016-10-08 ENCOUNTER — Telehealth: Payer: Self-pay | Admitting: Pharmacist

## 2016-10-08 DIAGNOSIS — E1142 Type 2 diabetes mellitus with diabetic polyneuropathy: Secondary | ICD-10-CM

## 2016-10-08 MED ORDER — INSULIN REGULAR HUMAN 100 UNIT/ML IJ SOLN: INTRAMUSCULAR | 0 refills | Status: DC

## 2016-10-08 NOTE — Telephone Encounter (Signed)
S:    Chief Complaint  Patient presents with  . Medication Management    Diabetes    Went to patient's home to assess CBG control and adjust DM medication at request of Rennie Plowman, NP.  Patient was re-referred on 10/03/2016.  Patient was last seen by Primary Care Provider on 10/03/2016. Patient and daughter have been working with Rx clinic for several months and appreciate continued visits.   Patient keeps detail BG log, adjusted insulin doses depending on projected activity and PO intake. Patient reports variable appetite in afternoon and evenings. On NPH and regular insulin, not interested in switching given complications with change in the past.  Patient lives alone in assisted living however daughter checks on her several times/week, takes her swimming, and calls daily to remind patient to eat a snack between 2-3 PM otherwise, patient will experience hypoglycemia between 3-4PM every day. Patient's daughter endorses significant stress about hypoglycemic episodes and states that it is difficult to bring CBGs back to normal   Daughter reports that she does not given meal time insulins with CBGs <180 before exercise, does not give meal time with CBGs < 100 in the morning and <120 with lunch or dinner if not exercising  Family/Social History: husband and one daughter deceased. Moved from [place].  Patient reports adherence with medications.  Current diabetes medications include: Linagliptin 5 mg daily, NPH 20 in AM and 10 in PM, Regular 3 with breakfast, 0-6 with lunch, 0-6 with dinner Current hypertension medications include: lisinopril 10 mg daily  Patient reports hypoglycemic events.x1 the last 2 weeks.   Patient reported dietary habits: Eats 2-3 meals/day. Eats "bun" for breakfast, provided lunch, rarely eats supper but always has dessert.   Patient reported exercise habits: Pool 2x/week   Lab Results  Component Value Date   HGBA1C 6.5 10/03/2016   Home fasting CBG: 90s-120s,  excursions to 176, 189, 181,  202 Before lunch: <180 Before supper: largely <180, some in 190s, 200s Before bedtime: frequently in 200s, 170s-220s  A/P: Diabetes longstanding currently controlled per A1C, variable control per CBG readings. Patient reports hypoglycemic events and is able to verbalize appropriate hypoglycemia management plan. Patient reports adherence with medication. Control is suboptimal due to variable appetite. Following discussion and approval by Rennie Plowman, NP, the following medication changes were made:  -Continue NPH 20 units qAM, 10 units qPM -Changed regular insulin: 3 units with breakfast, 3 units with lunch (decreasing given need for preventative snacking), supper: give 0 units if CBGs 120 or lower, give 3 units if CBGs 121-199, give 6 units if CBGs 200 or higher -Patient verbalized understanding of the above and demonstrated understanding with teach back method -Continue other meds.  -Daughter to send CBG log weekly, will follow up with patient over the phone. Pt has follow up with Rennie Plowman 10/3 @ 11AM.   Written patient instructions provided.  Total time in face to face counseling 40 minutes.   Follow up over the phone in ~1 week.   Patient seen with Durward Mallard, PharmD Candidate.   Allena Katz, Pharm.D. PGY2 Ambulatory Care Pharmacy Resident Phone: [phone]

## 2016-10-09 NOTE — Telephone Encounter (Signed)
Thank you for seeing her  Agree with plan, Rennie Plowman, NP

## 2016-10-10 ENCOUNTER — Telehealth: Payer: Self-pay | Admitting: Pharmacist

## 2016-10-10 NOTE — Telephone Encounter (Signed)
Called patient to assess glycemic control and understanding of changes since last visit. No answer. Left HIPAA-compliant VM requesting she call me back.   Allena Katz, Pharm.D. PGY2 Ambulatory Care Pharmacy Resident Phone: [phone]

## 2016-10-12 ENCOUNTER — Telehealth: Payer: Self-pay | Admitting: *Deleted

## 2016-10-12 NOTE — Telephone Encounter (Signed)
Please advise 

## 2016-10-12 NOTE — Telephone Encounter (Signed)
Rayfield Citizen called patient daughter and gave advice caroline is suppose to be putting note in to you . Per patient daughter [name].

## 2016-10-12 NOTE — Telephone Encounter (Signed)
Pt's daughter has requested a call to discuss pt's blood sugar.  When pt exercises/swim her blood sugars drop.  Daughter is requesting adjustments  Pt contact [name] [phone]

## 2016-10-12 NOTE — Telephone Encounter (Signed)
Please call and get more info from patient-  Advise to cut NPH doses in half for over weekend until I can speak speak with patient and pharmacist Rayfield Citizen next week

## 2016-10-13 ENCOUNTER — Telehealth: Payer: Self-pay | Admitting: Pharmacist

## 2016-10-13 NOTE — Telephone Encounter (Signed)
S/O Patient's daughter called to discuss hypoglycemic episodes x2 days. Daughter states that her mother understood changes made last week to insulin doses however has not been giving bedtime NPH 2/2 fear of nocturnal hypoglycemia. Additionally, patient's daughter reports patient has increased activity further and is now participating in tai chi classes and swimming daily. On the days she is swimming, daughter reports she is "fighting" to keep her BG from dropping too low, even with skipping lunch time insulin Regular. Daughter requests change to NPH dosing.  Patient's current prescribed insulin regimen:  NPH: 20 units qAM, 10 units qPM Regular: 3 units with breakfast, 3 units with lunch, supper: give 0 units if CBGs 120 or lower, give 3 units if CBGs 121-199, give 6 units if CBGs 200 or higher  Per CBG log sent by daughter, AM glucoses are in 200s with skipped bedtime NPH  A/P Patient and daughter are understandably very afraid of hypoglycemia as patient lives alone. Recommended adherence to insulin as much as safely possible and educated on proper hypoglycemia management.   Advised daughter to decrease NPH to 15 qAM on days with increased activity and give 5 units with bedtime for now.   Will discuss concerns and changes with Mable Paris, NP and contact patient as needed for adjustment to plan.   Carlean Jews, Pharm.D. PGY2 Ambulatory Care Pharmacy Resident Phone: [phone]

## 2016-10-15 NOTE — Telephone Encounter (Signed)
Agree with your changes and thank you so much for reaching out to them on Friday.   I was out of the office.  Ill see her Weds and we can go from there!

## 2016-10-15 NOTE — Telephone Encounter (Signed)
Called to check on patient, daughter answers phone. States that the implemented the recommended changes with insulin. Have not seen lows and have not been fighting to keep CBGs up. Patient continues with lots of activity, daily swimming and some tai chi classes. CBGs have been running in the mid-100s for the most part, daughter seems much more comfortable that previous.   I think patient would be a great candidate for CGM - freestyle libre, specifically. Daughter expresses interest but will need to know the cost.   Consider following up at appointment on 10/17/16 with Joycelyn Schmid. Patient would qualify with checking CBGs 4+ times daily and taking 3+ shots of insulin daily.  Carlean Jews, Pharm.D. PGY2 Ambulatory Care Pharmacy Resident Phone: [phone]

## 2016-10-15 NOTE — Progress Notes (Signed)
Subjective:    Patient ID: Amy Calhoun, female    DOB: [birth year], 83 y.o.   MRN: [id number]  CC: Amy Calhoun is a 83 y.o. female who presents today for follow up.   HPI: DM- since change to 15 units NPH, no further episodes of hypoglycemia. FBGs had been in 200's.  After swimming , blood sugar are 130 consistently.Gives snack- protein bar, piece of fruit, water.  However hours later , blood sugars would drop to 50. This has not happened since decrease of NPH.   NPH: 15 units qAM, 10 units qPM PM NPH dosing- give 10 units if blood sugar is over 200, if blood sugar, less than 120  give 5 units with bedtime for now. If less than 100 at bedtime, patient understands to NOT take NPH.  Regular: 3 units with breakfast                3 units with lunch                  supper: give 0 units if CBGs 120 or lower, give 3 units if CBGs 121-199, give 6 units if CBGs 200 or greater.    patient lives alone.   Pharmacist advised daughter to decrease NPH to 15 qAM on days with increased activity last week.   Daughter notes that in the evenings , she doesn't eat as much.   Daughter also reports suprapubic pressure, frequency x 2 weeks, unchanged. No flank pain, hematuria, fever.         HISTORY:  Past Medical History:  Diagnosis Date  . Chronic kidney disease 08/07/2016   Chronic renal failure, stage III  . Depression   . Esophageal dysphagia   . Gastric ulcer   . GERD (gastroesophageal reflux disease)   . Hyperlipidemia   . Hypertension   . Hypothyroidism   . IDDM (insulin dependent diabetes mellitus) (HCC)   . Ischemic colitis (HCC)   . Osteoarthritis   . Ulcer of esophagus    Past Surgical History:  Procedure Laterality Date  . ABDOMINAL HYSTERECTOMY    . ESOPHAGEAL DILATION  08/07/2016   usually a couple times a year, last time 07/30/16  . GALLBLADDER SURGERY    . LUMBAR LAMINECTOMY/DECOMPRESSION MICRODISCECTOMY N/A 08/10/2016   Procedure: BILATERAL HEMILAMINECTOMY  LUMBAR THREE-FOUR,LUMBAR FOUR-FIVE,LEFT LUMBAR FIVE-SACRAL ONE HEMILAMINECTOMY AND DECOMPRESSION;  Surgeon: Coletta Memos, MD;  Location: [facility] OR;  Service: Neurosurgery;  Laterality: N/A;  . THYROIDECTOMY     Family History  Problem Relation Age of Onset  . Diabetes Mother   . Arthritis Mother   . Hyperlipidemia Mother   . Mental illness Mother   . Heart disease Father   . Mental illness Sister   . Arthritis Maternal Grandmother   . Arthritis Maternal Grandfather   . Colon cancer Neg Hx   . Stomach cancer Neg Hx   . Esophageal cancer Neg Hx     Allergies: Pravastatin Current Outpatient Prescriptions on File Prior to Visit  Medication Sig Dispense Refill  . acetaminophen-codeine (TYLENOL #3) 300-30 MG tablet Take 1 tablet by mouth every 6 (six) hours as needed for moderate pain. 30 tablet 0  . acidophilus (RISAQUAD) CAPS capsule Take 1 capsule by mouth daily.    Marland Kitchen albuterol (PROVENTIL HFA;VENTOLIN HFA) 108 (90 Base) MCG/ACT inhaler Inhale 2 puffs into the lungs every 6 (six) hours as needed for wheezing or shortness of breath. 1 Inhaler 2  . albuterol (PROVENTIL) (2.5 MG/3ML) 0.083% nebulizer  solution Take 3 mLs (2.5 mg total) by nebulization every 6 (six) hours as needed for wheezing or shortness of breath. 75 mL 12  . amitriptyline (ELAVIL) 75 MG tablet Take 2 tablets (150 mg total) by mouth at bedtime. 90 tablet 0  . Calcium Citrate-Vitamin D (CALCIUM + D PO) Take 1 tablet by mouth daily.    . Coenzyme Q10 (CO Q 10) 100 MG CAPS Take 1 capsule by mouth daily.    Marland Kitchen CRANBERRY PO Take 1 tablet by mouth every Monday, Wednesday, and Friday.    . docusate sodium (COLACE) 100 MG capsule Take 1 capsule (100 mg total) by mouth 2 (two) times daily. 10 capsule 0  . gabapentin (NEURONTIN) 100 MG capsule Take 2 capsules (200 mg total) by mouth 3 (three) times daily. 540 capsule 1  . glucagon (GLUCAGON EMERGENCY) 1 MG injection Inject 1 mg into the vein once as needed. For hypoglycemic episodes.  E11.42 1 each 3  . HYDROcodone-acetaminophen (NORCO/VICODIN) 5-325 MG tablet Take 1 tablet by mouth every 6 (six) hours as needed for moderate pain. 28 tablet 0  . ibuprofen (ADVIL,MOTRIN) 200 MG tablet Take 800-1,000 mg by mouth 2 (two) times daily.    . insulin NPH Human (HUMULIN N,NOVOLIN N) 100 UNIT/ML injection 20 units every morning and 10 units at night 30 mL 1  . insulin regular (NOVOLIN R,HUMULIN R) 100 units/mL injection 3 units before breakfast , 3 units before lunch , supper: 0 units for CBG 120 or lower, 3 units for CBG 121-199, 6 units for CBG 200 or higher Please dispense 90 day supply 10 mL 0  . levothyroxine (SYNTHROID, LEVOTHROID) 150 MCG tablet Take 1 tablet (150 mcg total) by mouth daily. 90 tablet 3  . lidocaine (LIDODERM) 5 % PLACE 1 PATCH ON THE SKIN DAILY 90 patch 1  . linagliptin (TRADJENTA) 5 MG TABS tablet Take 1 tablet (5 mg total) by mouth daily. 90 tablet 1  . lisinopril (PRINIVIL,ZESTRIL) 10 MG tablet Take 1 tablet (10 mg total) by mouth daily. 90 tablet 3  . omeprazole (PRILOSEC) 40 MG capsule Take 1 capsule (40 mg total) by mouth daily. 90 capsule 3  . pyridOXINE (VITAMIN B-6) 100 MG tablet Take 100 mg by mouth daily.    . riboflavin (VITAMIN B-2) 100 MG TABS tablet Take 100 mg by mouth daily.    Marland Kitchen senna-docusate (SENOKOT-S) 8.6-50 MG tablet Take 1 tablet by mouth at bedtime. 30 tablet 5  . simvastatin (ZOCOR) 40 MG tablet Take 40 mg by mouth at bedtime.      Current Facility-Administered Medications on File Prior to Visit  Medication Dose Route Frequency Provider Last Rate Last Dose  . 0.9 %  sodium chloride infusion  500 mL Intravenous Continuous Rachael Fee, MD        Social History  Substance Use Topics  . Smoking status: Never Smoker  . Smokeless tobacco: Never Used  . Alcohol use No    Review of Systems  Constitutional: Negative for chills and fever.  Respiratory: Negative for cough.   Cardiovascular: Negative for chest pain and palpitations.   Gastrointestinal: Negative for nausea and vomiting.  Genitourinary: Positive for dysuria and frequency. Negative for hematuria and urgency.      Objective:    BP (!) 132/58   Pulse 76   Temp (!) 97.5 F (36.4 C) (Oral)   Ht  (1.676 m)   Wt 158 lb 12.8 oz (72 kg)   SpO2 93%  BMI 25.63 kg/m  BP Readings from Last 3 Encounters:  10/17/16 (!) 132/58  10/03/16 134/62  08/21/16 130/60   Wt Readings from Last 3 Encounters:  10/17/16 158 lb 12.8 oz (72 kg)  10/03/16 161 lb 3.2 oz (73.1 kg)  08/21/16 164 lb (74.4 kg)    Physical Exam  Constitutional: She appears well-developed and well-nourished.  Cardiovascular: Normal rate, regular rhythm, normal heart sounds and normal pulses.   Pulmonary/Chest: Effort normal and breath sounds normal. She has no wheezes. She has no rhonchi. She has no rales.  Abdominal: There is no CVA tenderness.  Neurological: She is alert.  Skin: Skin is warm and dry.  Psychiatric: She has a normal mood and affect. Her speech is normal and behavior is normal. Thought content normal.  Vitals reviewed.      Assessment & Plan:   Problem List Items Addressed This Visit      Endocrine   DM type 2 with diabetic peripheral neuropathy (HCC) - Primary    very pleased no more hypoglycemic episodes. Patient declines DM nutritionist referral.  Will maintain on current regimen with close collaboration with Rayfield Citizen, pharmacist.        Relevant Medications   Continuous Blood Gluc Sensor (FREESTYLE LIBRE SENSOR SYSTEM) MISC     Other   Urinary frequency    Afebrile.Pending urine studies.       Relevant Orders   POCT Urinalysis Dipstick (Completed)   Urine Culture   Urine Microscopic       I am having Ms. Netherland start on [employer]. I am also having her maintain her simvastatin, lisinopril, omeprazole, pyridOXINE, riboflavin, glucagon, gabapentin, docusate sodium, senna-docusate, levothyroxine, linagliptin, insulin NPH Human,  lidocaine, acidophilus, CRANBERRY PO, albuterol, albuterol, ibuprofen, Co Q 10, Calcium Citrate-Vitamin D (CALCIUM + D PO), HYDROcodone-acetaminophen, acetaminophen-codeine, amitriptyline, and insulin regular. We will continue to administer sodium chloride.   Meds ordered this encounter  Medications  . Continuous Blood Gluc Sensor (FREESTYLE LIBRE SENSOR SYSTEM) MISC    Sig: 1 Device by Does not apply route QID.    Dispense:  1 each    Refill:  0    Order Specific Question:   Supervising Provider    Answer:   Sherlene Shams [[id number]]    Return precautions given.   Risks, benefits, and alternatives of the medications and treatment plan prescribed today were discussed, and patient expressed understanding.   Education regarding symptom management and diagnosis given to patient on AVS.  Continue to follow with Allegra Grana, FNP for routine health maintenance.   Samm[facility] Mae [facility] and I agreed with plan.   Rennie Plowman, FNP

## 2016-10-16 ENCOUNTER — Ambulatory Visit: Payer: Medicare HMO | Admitting: Family Medicine

## 2016-10-16 ENCOUNTER — Ambulatory Visit: Payer: Medicare HMO | Admitting: Family

## 2016-10-16 NOTE — Telephone Encounter (Signed)
Agree with your plan.  Will discuss cgm with them.  Margaret arnett, np

## 2016-10-17 ENCOUNTER — Encounter: Payer: Self-pay | Admitting: Family

## 2016-10-17 ENCOUNTER — Ambulatory Visit (INDEPENDENT_AMBULATORY_CARE_PROVIDER_SITE_OTHER): Payer: Medicare HMO | Admitting: Family

## 2016-10-17 VITALS — BP 132/58 | HR 76 | Temp 97.5°F | Ht 66.0 in | Wt 158.8 lb

## 2016-10-17 DIAGNOSIS — E1142 Type 2 diabetes mellitus with diabetic polyneuropathy: Secondary | ICD-10-CM | POA: Diagnosis not present

## 2016-10-17 DIAGNOSIS — R300 Dysuria: Secondary | ICD-10-CM | POA: Diagnosis not present

## 2016-10-17 DIAGNOSIS — N390 Urinary tract infection, site not specified: Secondary | ICD-10-CM | POA: Insufficient documentation

## 2016-10-17 DIAGNOSIS — R350 Frequency of micturition: Secondary | ICD-10-CM | POA: Diagnosis not present

## 2016-10-17 LAB — URINALYSIS, MICROSCOPIC ONLY

## 2016-10-17 LAB — POCT URINALYSIS DIPSTICK
BILIRUBIN UA: NEGATIVE
GLUCOSE UA: NEGATIVE
Ketones, UA: NEGATIVE
Nitrite, UA: POSITIVE
SPEC GRAV UA: 1.015 (ref 1.010–1.025)
UROBILINOGEN UA: 0.2 U/dL
pH, UA: 5.5 (ref 5.0–8.0)

## 2016-10-17 MED ORDER — FREESTYLE LIBRE SENSOR SYSTEM MISC: 1.0000 | Freq: Four times a day (QID) | 0 refills | Status: DC

## 2016-10-17 NOTE — Assessment & Plan Note (Signed)
Afebrile.Pending urine studies.

## 2016-10-17 NOTE — Patient Instructions (Signed)
Pleased to see to amount of exercise however we must watch sugars very closely.   Will call regarding blood sugars  Let me know immediately if ANY lows.    Will let you know about continuous glucose monitor.  Follow up one month, sooner if any  Problems.    Hypoglycemia Hypoglycemia is when the sugar (glucose) level in the blood is too low. Symptoms of low blood sugar may include:  Feeling: ? Hungry. ? Worried or nervous (anxious). ? Sweaty and clammy. ? Confused. ? Dizzy. ? Sleepy. ? Sick to your stomach (nauseous).  Having: ? A fast heartbeat. ? A headache. ? A change in your vision. ? Jerky movements that you cannot control (seizure). ? Nightmares. ? Tingling or no feeling (numbness) around the mouth, lips, or tongue.  Having trouble with: ? Talking. ? Paying attention (concentrating). ? Moving (coordination). ? Sleeping.  Shaking.  Passing out (fainting).  Getting upset easily (irritability).  Low blood sugar can happen to people who have diabetes and people who do not have diabetes. Low blood sugar can happen quickly, and it can be an emergency. Treating Low Blood Sugar Low blood sugar is often treated by eating or drinking something sugary right away. If you can think clearly and swallow safely, follow the 15:15 rule:  Take 15 grams of a fast-acting carb (carbohydrate). Some fast-acting carbs are: ? 1 tube of glucose gel. ? 3 sugar tablets (glucose pills). ? 6-8 pieces of hard candy. ? 4 oz (120 mL) of fruit juice. ? 4 oz (120 mL) of regular (not diet) soda.  Check your blood sugar 15 minutes after you take the carb.  If your blood sugar is still at or below 70 mg/dL (3.9 mmol/L), take 15 grams of a carb again.  If your blood sugar does not go above 70 mg/dL (3.9 mmol/L) after 3 tries, get help right away.  After your blood sugar goes back to normal, eat a meal or a snack within 1 hour.  Treating Very Low Blood Sugar If your blood sugar is at or  below 54 mg/dL (3 mmol/L), you have very low blood sugar (severe hypoglycemia). This is an emergency. Do not wait to see if the symptoms will go away. Get medical help right away. Call your local emergency services (911 in the [place] Do not drive yourself to the hospital. If you have very low blood sugar and you cannot eat or drink, you may need a glucagon shot (injection). A family member or friend should learn how to check your blood sugar and how to give you a glucagon shot. Ask your doctor if you need to have a glucagon shot kit at home. Follow these instructions at home: General instructions  Avoid any diets that cause you to not eat enough food. Talk with your doctor before you start any new diet.  Take over-the-counter and prescription medicines only as told by your doctor.  Limit alcohol to no more than 1 drink per day for nonpregnant women and 2 drinks per day for men. One drink equals 12 oz of beer, 5 oz of wine, or 1 oz of hard liquor.  Keep all follow-up visits as told by your doctor. This is important. If You Have Diabetes:   Make sure you know the symptoms of low blood sugar.  Always keep a source of sugar with you, such as: ? Sugar. ? Sugar tablets. ? Glucose gel. ? Fruit juice. ? Regular soda (not diet soda). ? Milk. ? Hard   candy. ? Honey.  Take your medicines as told.  Follow your exercise and meal plan. ? Eat on time. Do not skip meals. ? Follow your sick day plan when you cannot eat or drink normally. Make this plan ahead of time with your doctor.  Check your blood sugar as often as told by your doctor. Always check before and after exercise.  Share your diabetes care plan with: ? Your work or school. ? People you live with.  Check your pee (urine) for ketones: ? When you are sick. ? As told by your doctor.  Carry a card or wear jewelry that says you have diabetes. If You Have Low Blood Sugar From Other Causes:   Check your blood sugar as often as  told by your doctor.  Follow instructions from your doctor about what you cannot eat or drink. Contact a doctor if:  You have trouble keeping your blood sugar in your target range.  You have low blood sugar often. Get help right away if:  You still have symptoms after you eat or drink something sugary.  Your blood sugar is at or below 54 mg/dL (3 mmol/L).  You have jerky movements that you cannot control.  You pass out. These symptoms may be an emergency. Do not wait to see if the symptoms will go away. Get medical help right away. Call your local emergency services (911 in the [place] Do not drive yourself to the hospital. This information is not intended to replace advice given to you by your health care provider. Make sure you discuss any questions you have with your health care provider. Document Released: 03/28/2009 Document Revised: 06/09/2015 Document Reviewed: 02/04/2015 Elsevier Interactive Patient Education  [employer].

## 2016-10-17 NOTE — Assessment & Plan Note (Addendum)
very pleased no more hypoglycemic episodes. Patient declines DM nutritionist referral.  Will maintain on current regimen with close collaboration with Rayfield Citizen, pharmacist. CGM ordered.

## 2016-10-19 DIAGNOSIS — R69 Illness, unspecified: Secondary | ICD-10-CM | POA: Diagnosis not present

## 2016-10-20 LAB — URINE CULTURE
MICRO NUMBER: 81098302
SPECIMEN QUALITY:: ADEQUATE

## 2016-10-22 ENCOUNTER — Other Ambulatory Visit: Payer: Self-pay | Admitting: Family

## 2016-10-22 DIAGNOSIS — R319 Hematuria, unspecified: Secondary | ICD-10-CM

## 2016-10-23 ENCOUNTER — Encounter: Payer: Self-pay | Admitting: Family

## 2016-10-29 ENCOUNTER — Encounter: Payer: Self-pay | Admitting: Pharmacist

## 2016-10-29 ENCOUNTER — Other Ambulatory Visit: Payer: Self-pay | Admitting: Pharmacist

## 2016-10-29 DIAGNOSIS — E1142 Type 2 diabetes mellitus with diabetic polyneuropathy: Secondary | ICD-10-CM

## 2016-10-29 NOTE — Telephone Encounter (Signed)
Patient's daughter called today to inquire about cost of Freestyle Libre meter.  Informed daughter that cost would be determined by copay from insurance.   Appears that freestyle Josephine Igo was sent to patient's mail order pharmacy. This meter and reader should be sent to durable medical equipment supplier.   Recommend sending Freestyle libre system sensors, meter, and test strips to [employer]. Prescriptions pended. Will route to Rennie Plowman, NP for review.  Allena Katz, Pharm.D. PGY2 Ambulatory Care Pharmacy Resident Phone: [phone]

## 2016-10-30 MED ORDER — GLUCOSE BLOOD VI STRP: ORAL_STRIP | 12 refills | Status: DC

## 2016-10-30 MED ORDER — FREESTYLE LIBRE READER DEVI: 1.0000 | Freq: Once | 0 refills | Status: DC

## 2016-10-30 MED ORDER — FREESTYLE LIBRE SENSOR SYSTEM MISC: 11 refills | Status: DC

## 2016-10-31 ENCOUNTER — Encounter: Payer: Self-pay | Admitting: Sports Medicine

## 2016-10-31 ENCOUNTER — Ambulatory Visit (INDEPENDENT_AMBULATORY_CARE_PROVIDER_SITE_OTHER): Payer: Medicare HMO | Admitting: Sports Medicine

## 2016-10-31 VITALS — BP 150/90 | HR 73 | Ht 66.0 in | Wt 160.2 lb

## 2016-10-31 DIAGNOSIS — K224 Dyskinesia of esophagus: Secondary | ICD-10-CM

## 2016-10-31 DIAGNOSIS — I10 Essential (primary) hypertension: Secondary | ICD-10-CM

## 2016-10-31 DIAGNOSIS — M48062 Spinal stenosis, lumbar region with neurogenic claudication: Secondary | ICD-10-CM

## 2016-10-31 DIAGNOSIS — E1142 Type 2 diabetes mellitus with diabetic polyneuropathy: Secondary | ICD-10-CM

## 2016-10-31 DIAGNOSIS — N183 Chronic kidney disease, stage 3 unspecified: Secondary | ICD-10-CM

## 2016-10-31 DIAGNOSIS — G8929 Other chronic pain: Secondary | ICD-10-CM

## 2016-10-31 MED ORDER — IBUPROFEN 600 MG PO TABS: 600.0000 mg | ORAL_TABLET | Freq: Two times a day (BID) | ORAL | 1 refills | Status: DC | PRN

## 2016-10-31 NOTE — Progress Notes (Signed)
OFFICE VISIT NOTE Amy FellsMichael D. Delorise Shinerigby, DO  [facility] [place] [phone]  Amy Fuller CanadaMae Calhoun - 83 y.o. female MRN [id number]  Date of birth: 01/08/1929  Visit Date: 10/31/2016  PCP: Allegra GranaArnett, Margaret G, FNP   Referred by: Allegra GranaArnett, Margaret G, FNP  Orlie DakinBrandy Shelton, CMA acting as scribe for Dr. Berline Choughigby.  SUBJECTIVE:   Chief Complaint  Patient presents with  . Follow-up    bilateral hip pain   HPI: As below and per problem based documentation when appropriate.  Amy Calhoun is an established patient presenting today in follow-up of bilateral hip pain and thoracic spine pain. [name] reports that there has been redness and swelling "like a softball" around the LT trochanter. Pain is described as sharp and throbbing and is rated 10/10 during flare-up. She was told not to take NSAIDs by her PCP d/t kidney function but this is the only thing that really seems to help with the pain. She has tried taking Tylenol Arthritis with no relief. She is no longer taking Norco/Vocodin or Tylenol 800 mg (s/p hemilaminectomy 07/2016). She is still taking Tylenol #3. She will typically have a flare-up about once a month which lasts about 5-7 days and seems to be random. Pain is worse when walking. She does experience groin pain during these flare-ups. She is currently getting over a UTI, finished abx Friday, and is not sure if groin pain is related to that. She has tried using a heating pad on the area but that seems to make pain worse. She has also tried applying heat to the area and she does get some relief with this. Laterality changes with each flare-up but the last was worse at the LT hip.     Review of Systems  Constitutional: Negative for chills and fever.  Respiratory: Negative for shortness of breath and wheezing.   Cardiovascular: Negative for chest pain and palpitations.  Musculoskeletal: Positive for joint pain and myalgias.  Neurological: Negative for  dizziness, tingling and headaches.    Otherwise per HPI.  HISTORY & PERTINENT PRIOR DATA:  No specialty comments available. She reports that she has never smoked. She has never used smokeless tobacco.   Recent Labs  03/30/16 0447 07/06/16 0924 10/03/16 1110  HGBA1C 7.4* 6.9* 6.5   Allergies reviewed per EMR Prior to Admission medications   Medication Sig Start Date End Date Taking? Authorizing Provider  acetaminophen-codeine (TYLENOL #3) 300-30 MG tablet Take 1 tablet by mouth every 6 (six) hours as needed for moderate pain. 08/31/16  Yes Andrena Mewsigby, Leya Paige D, DO  acidophilus (RISAQUAD) CAPS capsule Take 1 capsule by mouth daily.   Yes [provider]  albuterol (PROVENTIL HFA;VENTOLIN HFA) 108 (90 Base) MCG/ACT inhaler Inhale 2 puffs into the lungs every 6 (six) hours as needed for wheezing or shortness of breath. 07/13/16  Yes Altamese DillingVachhani, Vaibhavkumar, MD  albuterol (PROVENTIL) (2.5 MG/3ML) 0.083% nebulizer solution Take 3 mLs (2.5 mg total) by nebulization every 6 (six) hours as needed for wheezing or shortness of breath. 07/13/16  Yes Enedina FinnerPatel, Sona, MD  amitriptyline (ELAVIL) 75 MG tablet Take 2 tablets (150 mg total) by mouth at bedtime. 09/05/16  Yes Allegra GranaArnett, Margaret G, FNP  Calcium Citrate-Vitamin D (CALCIUM + D PO) Take 1 tablet by mouth daily.   Yes [provider]  Coenzyme Q10 (CO Q 10) 100 MG CAPS Take 1 capsule by mouth daily.   Yes [provider]  Continuous Blood Gluc Sensor (FREESTYLE LIBRE SENSOR  SYSTEM) MISC Please dispense 3 sensors monthly 10/30/16  Yes Allegra Grana, FNP  CRANBERRY PO Take 1 tablet by mouth every Monday, Wednesday, and Friday.   Yes [provider]  docusate sodium (COLACE) 100 MG capsule Take 1 capsule (100 mg total) by mouth 2 (two) times daily. 03/31/16  Yes Katharina Caper, MD  gabapentin (NEURONTIN) 100 MG capsule Take 2 capsules (200 mg total) by mouth 3 (three) times daily. 03/27/16  Yes Andrena Mews, DO  glucagon  (GLUCAGON EMERGENCY) 1 MG injection Inject 1 mg into the vein once as needed. For hypoglycemic episodes. E11.42 03/13/16  Yes Cook, Jayce G, DO  glucose blood (FREESTYLE PRECISION NEO TEST) test strip Test blood sugar once 3 times daily 10/30/16  Yes Arnett, Lyn Records, FNP  insulin NPH Human (HUMULIN N,NOVOLIN N) 100 UNIT/ML injection 20 units every morning and 10 units at night 06/12/16  Yes Cook, Jayce G, DO  insulin regular (NOVOLIN R,HUMULIN R) 100 units/mL injection 3 units before breakfast , 3 units before lunch , supper: 0 units for CBG 120 or lower, 3 units for CBG 121-199, 6 units for CBG 200 or higher Please dispense 90 day supply 10/08/16  Yes Arnett, Lyn Records, FNP  levothyroxine (SYNTHROID, LEVOTHROID) 150 MCG tablet Take 1 tablet (150 mcg total) by mouth daily. 04/03/16  Yes Cook, Jayce G, DO  lidocaine (LIDODERM) 5 % PLACE 1 PATCH ON THE SKIN DAILY 06/26/16  Yes Cook, Jayce G, DO  linagliptin (TRADJENTA) 5 MG TABS tablet Take 1 tablet (5 mg total) by mouth daily. 04/05/16  Yes Cook, Jayce G, DO  lisinopril (PRINIVIL,ZESTRIL) 10 MG tablet Take 1 tablet (10 mg total) by mouth daily. 01/19/16  Yes Cook, Jayce G, DO  omeprazole (PRILOSEC) 40 MG capsule Take 1 capsule (40 mg total) by mouth daily. 01/19/16  Yes Cook, Jayce G, DO  pyridOXINE (VITAMIN B-6) 100 MG tablet Take 100 mg by mouth daily.   Yes [provider]  riboflavin (VITAMIN B-2) 100 MG TABS tablet Take 100 mg by mouth daily.   Yes [provider]  senna-docusate (SENOKOT-S) 8.6-50 MG tablet Take 1 tablet by mouth at bedtime. 03/31/16  Yes Katharina Caper, MD  simvastatin (ZOCOR) 40 MG tablet Take 40 mg by mouth at bedtime.    Yes [provider]  Continuous Blood Gluc Receiver (FREESTYLE LIBRE READER) DEVI 1 Device by Does not apply route once. 10/30/16 10/30/16  Allegra Grana, FNP  ibuprofen (ADVIL,MOTRIN) 600 MG tablet Take 1 tablet (600 mg total) by mouth 2 (two) times daily as needed. 10/31/16   Andrena Mews, DO   Patient Active Problem List   Diagnosis Date Noted  . Urinary frequency 10/17/2016  . Lumbar stenosis with neurogenic claudication 08/10/2016  . Acute respiratory failure with hypoxemia (HCC) 07/12/2016  . Community acquired pneumonia 07/12/2016  . Anemia 07/06/2016  . Osteoarthritis of spine with radiculopathy, lumbar region 03/16/2016  . History of colitis 03/02/2016  . Primary osteoarthritis of left hip 03/02/2016  . CKD (chronic kidney disease) stage 3, GFR 30-59 ml/min (HCC) 02/21/2016  . DM type 2 with diabetic peripheral neuropathy (HCC) 01/19/2016  . Hyperlipidemia 01/19/2016  . Hypothyroidism 01/19/2016  . Essential hypertension 01/19/2016  . Chronic pain 01/19/2016  . Esophageal dysmotility 12/21/2015   Past Medical History:  Diagnosis Date  . Chronic kidney disease 08/07/2016   Chronic renal failure, stage III  . Depression   . Esophageal dysphagia   . Gastric ulcer   .  GERD (gastroesophageal reflux disease)   . Hyperlipidemia   . Hypertension   . Hypothyroidism   . IDDM (insulin dependent diabetes mellitus) (HCC)   . Ischemic colitis (HCC)   . Osteoarthritis   . Ulcer of esophagus    Family History  Problem Relation Age of Onset  . Diabetes Mother   . Arthritis Mother   . Hyperlipidemia Mother   . Mental illness Mother   . Heart disease Father   . Mental illness Sister   . Arthritis Maternal Grandmother   . Arthritis Maternal Grandfather   . Colon cancer Neg Hx   . Stomach cancer Neg Hx   . Esophageal cancer Neg Hx    Past Surgical History:  Procedure Laterality Date  . ABDOMINAL HYSTERECTOMY    . ESOPHAGEAL DILATION  08/07/2016   usually a couple times a year, last time 07/30/16  . GALLBLADDER SURGERY    . LUMBAR LAMINECTOMY/DECOMPRESSION MICRODISCECTOMY N/A 08/10/2016   Procedure: BILATERAL HEMILAMINECTOMY LUMBAR THREE-FOUR,LUMBAR FOUR-FIVE,LEFT LUMBAR FIVE-SACRAL ONE HEMILAMINECTOMY AND DECOMPRESSION;  Surgeon: Coletta Memos, MD;   Location: [facility] OR;  Service: Neurosurgery;  Laterality: N/A;  . THYROIDECTOMY     Social History   Occupational History  . retired    Social History Main Topics  . Smoking status: Never Smoker  . Smokeless tobacco: Never Used  . Alcohol use No  . Drug use: No  . Sexual activity: No    OBJECTIVE:  VS:  HT:5\' 6"  (167.6 cm)   WT:160 lb 3.2 oz (72.7 kg)  BMI:25.87    BP:(!) 150/90  HR:73bpm  TEMP: ( )  RESP:96 % EXAM: Findings:  Elderly female, no acute distress.  Alert and appropriate.  She is able to go from sit to stand in less than 2-3 seconds which is markedly improved.  Her lower extremities have no significant pain with straight leg raise.  She has no focal tenderness over her lumbar spine but does have a well-healed surgical incision.  No surrounding erythema.    RADIOLOGY: MR Lumbar Spine W Wo Contrast CLINICAL DATA:  Lumbar stenosis with neurogenic claudication  EXAM: MRI LUMBAR SPINE WITHOUT AND WITH CONTRAST  TECHNIQUE: Multiplanar and multiecho pulse sequences of the lumbar spine were obtained without and with intravenous contrast.  CONTRAST:  8mL MULTIHANCE GADOBENATE DIMEGLUMINE 529 MG/ML IV SOLN  COMPARISON:  Lumbar MRI 06/20/2016  FINDINGS: Segmentation:  Normal  Alignment:  Mild retrolisthesis L2-3  Vertebrae: Negative for fracture or mass. Discogenic endplate changes L3-4 L4-5 and L5-S1.  Conus medullaris: Extends to the L1-2 level and appears normal.  Paraspinal and other soft tissues: Postsurgical enhancing scar tissue posteriorly. No fluid collection or mass.  Disc levels:  L1-2:  Negative  L2-3: Mild retrolisthesis. Mild disc and facet degeneration without stenosis  L3-4: Advanced disc degeneration and spurring. Bilateral laminotomy with enhancing scar tissue around the thecal sac. Mild spinal stenosis, improved following posterior decompression. Subarticular stenosis bilaterally. Moderate foraminal encroachment bilaterally right  greater than left, unchanged from the prior MRI.  L4-5: Advanced disc degeneration with diffuse endplate spurring. Bilateral laminotomy with enhancing scar tissue around the thecal sac bilaterally. Mild spinal stenosis, improved following posterior decompression. Moderate foraminal encroachment bilaterally, without interval change since the prior MRI  L5-S1: Advanced disc degeneration and spondylosis with diffuse endplate spurring. Bilateral facet hypertrophy. Left laminectomy. Hypertrophy of the right ligamentum flavum. Moderate spinal stenosis, with mild interval improvement. Moderate left foraminal encroachment, unchanged.  IMPRESSION: Postop laminotomy at L3-4 with improvement in spinal stenosis. Moderate foraminal  encroachment right greater than left unchanged  Mild spinal stenosis L4-5 improved following posterior decompression. Moderate foraminal encroachment bilaterally without interval change  Left laminectomy L5-S1 with mild improvement in stenosis. No change in moderate left foraminal encroachment.  Electronically Signed   By: Marlan Palau M.D.   On: 10/07/2016 13:26  ASSESSMENT & PLAN:     ICD-10-CM   1. Other chronic pain G89.29 ibuprofen (ADVIL,MOTRIN) 600 MG tablet  2. DM type 2 with diabetic peripheral neuropathy (HCC) E11.42   3. Esophageal dysmotility K22.4 ibuprofen (ADVIL,MOTRIN) 600 MG tablet  4. Essential hypertension I10 ibuprofen (ADVIL,MOTRIN) 600 MG tablet  5. CKD (chronic kidney disease) stage 3, GFR 30-59 ml/min (HCC) N18.3 ibuprofen (ADVIL,MOTRIN) 600 MG tablet  6. Lumbar stenosis with neurogenic claudication M48.062 ibuprofen (ADVIL,MOTRIN) 600 MG tablet   ================================================================= Chronic pain Tylenol 3 currently prescribed by Dr. Mikal Plane with neurosurgery.  She will need referral to either pain management or to discuss chronic ongoing pain medications with her PCP if this needs to be continued.  She has  had a remarkable improvement following surgery and is able to ambulate and move significantly better.  She does have some symptom magnification but I do think that she has some chronic underlying pain generating features.  Hopefully low dose, intermittent NSAIDs will provide some benefit.  Caution with CKD.  Risk benefits discussed in detail today.  No notes on file ================================================================= Patient Instructions  Try "bursting" the Ibuprofen when you have acute flare ups.  Take this twice daily for 3-4 days then stop the medication.  It is okay to take out to 5-6 days if you are having persistent severe symptoms.  Then be sure to stay off of the medication for 5-7 days atleast.  If you don't need to take it please stay off it until your symptoms seem to get aggravated again.   ================================================================= Future Appointments Date Time Provider Department Center  12/12/2016 1:00 PM Andrena Mews, DO [facility] None    Follow-up: Return in about 6 weeks (around 12/12/2016).   CMA/ATC served as Neurosurgeon during this visit. History, Physical, and Plan performed by medical provider. Documentation and orders reviewed and attested to.      Gaspar Bidding, DO    [facility]

## 2016-10-31 NOTE — Patient Instructions (Signed)
Try "bursting" the Ibuprofen when you have acute flare ups.  Take this twice daily for 3-4 days then stop the medication.  It is okay to take out to 5-6 days if you are having persistent severe symptoms.  Then be sure to stay off of the medication for 5-7 days atleast.  If you don't need to take it please stay off it until your symptoms seem to get aggravated again.

## 2016-11-02 NOTE — Assessment & Plan Note (Signed)
Tylenol 3 currently prescribed by Dr. Mikal Planeabell with neurosurgery.  She will need referral to either pain management or to discuss chronic ongoing pain medications with her PCP if this needs to be continued.  She has had a remarkable improvement following surgery and is able to ambulate and move significantly better.  She does have some symptom magnification but I do think that she has some chronic underlying pain generating features.  Hopefully low dose, intermittent NSAIDs will provide some benefit.  Caution with CKD.  Risk benefits discussed in detail today.

## 2016-11-03 ENCOUNTER — Telehealth: Payer: Self-pay | Admitting: Family

## 2016-11-03 NOTE — Telephone Encounter (Signed)
Call pt  I know she was recently by Dr Berline Choughigby, sports medicine.   Pain in her hip appears to be quite chronic. I want to ensure her pain is appropriately managed- would she like referral to pain management?

## 2016-11-05 NOTE — Telephone Encounter (Signed)
Patients daughter has been informed. So I was told to hold off on referral for now.

## 2016-11-05 NOTE — Telephone Encounter (Signed)
Left a message for patient to return call back 

## 2016-11-10 ENCOUNTER — Other Ambulatory Visit: Payer: Self-pay | Admitting: Sports Medicine

## 2016-11-12 ENCOUNTER — Other Ambulatory Visit (INDEPENDENT_AMBULATORY_CARE_PROVIDER_SITE_OTHER): Payer: Medicare HMO

## 2016-11-12 DIAGNOSIS — R319 Hematuria, unspecified: Secondary | ICD-10-CM | POA: Diagnosis not present

## 2016-11-12 LAB — URINALYSIS, ROUTINE W REFLEX MICROSCOPIC
BILIRUBIN URINE: NEGATIVE
HGB URINE DIPSTICK: NEGATIVE
Ketones, ur: NEGATIVE
NITRITE: NEGATIVE
RBC / HPF: NONE SEEN (ref 0–?)
Specific Gravity, Urine: <=1.005 — AB (ref 1.000–1.030)
Total Protein, Urine: NEGATIVE
Urine Glucose: >=1000 — AB
Urobilinogen, UA: 0.2 (ref 0.0–1.0)
pH: 7 (ref 5.0–8.0)

## 2016-11-14 ENCOUNTER — Ambulatory Visit: Payer: Self-pay | Admitting: Pharmacist

## 2016-11-14 ENCOUNTER — Telehealth: Payer: Self-pay | Admitting: Pharmacist

## 2016-11-14 NOTE — Telephone Encounter (Signed)
Saw patient in her home to assess CBG control. Patient's daughter, [name] was present for the visit as well.   Patient reports she has had a cold for the last few days and her CBGs have been running higher. Patient keeps detailed CBG log, checking 5 times daily, and insulin log. Patient and her daughter frequently adjust insulin dosing in response to activity or patient's poor understanding of the prescribed regimen.   Generally, patient is using the following insulin dosing:  Insulin NPH 15 units qAM and 5-10 units qPM. If CBGs <150 at bedtime, will only give 5 units.  Insulin regular 3 units with breakfast, 0-6 units with lunch and supper. Will give 0 units if CBGs are <200 at lunch or <150 at supper. She is more active in the afternoon either swimming or doing Tai Chi and has a h/o hypoglycemia with exercise, thus will sometimes give 0 units with lunch.   CBG patterns while NOT ill:  Fasting: 100s-120s, excursions to 180s-190s x3 when she did not give enough NPH at bedtime Pre-lunch - 120s-190s, excursions to 247, 312 when she had high fasting CBG After lunch - 150s-low 200s  After supper - 100s-180s, excursions to 215 when she had high fasting CBG Bedtime - 160s-210s   Aside from the last three days while the patient had a cold, her glycemic control has been generally acceptable with the above adjusted regimen. Patient and daughter report zero hypoglycemic events in the last 2 weeks which is a big improvement from previous.  Counseled patient on adherence to insulin regimen described above as patient will sometimes deviate and give 0 units of NPH or R in the evenings.    No recommended changes to above insulin regimen at this time.   Carlean Jews, Pharm.D. PGY2 [facility]ulatory [facility] Pharmacy Resident Phone: [phone]

## 2016-11-15 NOTE — Telephone Encounter (Signed)
Noted. Agree with plan Rennie PlowmanMargaret Lorik Guo, np

## 2016-11-26 ENCOUNTER — Other Ambulatory Visit: Payer: Self-pay | Admitting: Family

## 2016-11-29 DIAGNOSIS — M48062 Spinal stenosis, lumbar region with neurogenic claudication: Secondary | ICD-10-CM | POA: Diagnosis not present

## 2016-12-12 ENCOUNTER — Ambulatory Visit: Payer: Medicare HMO | Admitting: Sports Medicine

## 2016-12-12 ENCOUNTER — Encounter: Payer: Self-pay | Admitting: Sports Medicine

## 2016-12-12 VITALS — BP 142/70 | Ht 66.0 in | Wt 160.0 lb

## 2016-12-12 DIAGNOSIS — M48062 Spinal stenosis, lumbar region with neurogenic claudication: Secondary | ICD-10-CM | POA: Diagnosis not present

## 2016-12-12 DIAGNOSIS — M4726 Other spondylosis with radiculopathy, lumbar region: Secondary | ICD-10-CM | POA: Diagnosis not present

## 2016-12-12 DIAGNOSIS — N183 Chronic kidney disease, stage 3 unspecified: Secondary | ICD-10-CM

## 2016-12-12 NOTE — Progress Notes (Signed)
Amy FellsMichael D. Amy Calhoun, Amy Calhoun  [facility] [place] [phone]  Amy Fuller CanadaMae Calhoun - 83 y.o. female MRN [id number]  Date of birth: 02/23/1928   Scribe for today's visit: Christoper FabianMolly Weber, ATC    SUBJECTIVE:  Amy Calhoun is here for Follow-up (B hip pain and t-spine pain) .   Compared to the last office visit on 10/31/16, her previously described B hip and t-spine pain symptoms are improving, with less pain.  She states that her L hip is bothering her the most today. Current symptoms are moderate & are radiating to L thigh to her knee. She has been doing aquatic therapy, taking Tylenol #3 w/ 400mg  IBU in the morning and then 1/2 of a Tylenol#3.  Her pain is being managed fairly well at the moment.   ROS Reports night time disturbances. Denies fevers, chills, or night sweats. Denies unexplained weight loss. Denies personal history of cancer. Denies changes in bowel or bladder habits. Denies recent unreported falls. Reports new or worsening dyspnea or wheezing. Denies headaches or dizziness.  Reports numbness, tingling or weakness  In the extremities.  Denies dizziness or presyncopal episodes Denies lower extremity edema    HISTORY & PERTINENT PRIOR DATA:  Prior History reviewed and updated per electronic medical record. Significant history, findings, studies and interim changes include: No additional findings.  reports that  has never smoked. she has never used smokeless tobacco. Recent Labs    03/30/16 0447 07/06/16 0924 10/03/16 1110  HGBA1C 7.4* 6.9* 6.5   No problems updated.   OBJECTIVE:  VS:  HT:5\' 6"  (167.6 cm)   WT:160 lb (72.6 kg)  BMI:25.84    BP:(!) 142/70  HR: bpm  TEMP: ( )  RESP:   PHYSICAL EXAM: Constitutional: WDWN, Non-toxic appearing. Psychiatric: Alert & appropriately interactive.Not depressed or anxious appearing. Respiratory: No increased work of breathing. Trachea Midline Eyes: Pupils are equal. EOM intact  without nystagmus. No scleral icterus   LOWER EXTREMITIES: No clubbing or cyanosis appreciated No significant venous stasis changes No calf tenderness, negative Homan's sign, no calf cords Generalized/Pre-tibial edema: 1+, bilatrally Pedal Pulses: Normal & symmetrically palpable  Sensation in LE dermatomes: intact to light touch   Back & Lower Extremities:  Bilateral negative straight leg raise.  Markedly less tight in the past  No significant midline tenderness.    Slight focal TTP over the bilateral gluteal muscles  Good internal and external rotation of the hips.  No focal weakness in the lower extremities  Lower extremity DTRs 2+/4 diffusely and symmetric  ASSESSMENT & PLAN:   1. Lumbar stenosis with neurogenic claudication   2. Osteoarthritis of spine with radiculopathy, lumbar region   3. CKD (chronic kidney disease) stage 3, GFR 30-59 ml/min (HCC)    Plan: She has done quite well and has significantly increased her activity especially in the pool..  We discussed multiple options regarding discontinuation of anti-inflammatories but they feels that she is unable to Amy Calhoun this at this time due to the ongoing pain.  We discussed multiple strategies including bursting and trial of turmeric OTC.  They Amy Calhoun understand the risk of worsening kidney function and increased cardiovascular risks with NSAID use.  No problem-specific Assessment & Plan notes found for this encounter.  >50% of this 25 minute visit spent in direct patient counseling and/or coordination of care.  Discussion was focused on education regarding the in discussing the pathoetiology and anticipated clinical course of the above condition.   ++++++++++++++++++++++++++++++++++++++++++++ Follow-up:  Return if symptoms worsen or fail to improve.   Pertinent documentation may be included in additional procedure notes, imaging studies, problem based documentation and patient instructions. Please see these sections of the  encounter for additional information regarding this visit. CMA/ATC served as Neurosurgeonscribe during this visit. History, Physical, and Plan performed by medical provider. Documentation and orders reviewed and attested to.      Andrena MewsMichael D Opal Dinning, Amy Calhoun    [facility] Physician

## 2016-12-12 NOTE — Patient Instructions (Addendum)
Try taking Tumeric wice daily, once in the morning and once at night, over-the-counter.  You are doing great from the standpoint of increasing her activity.  Keep up the good work.  Please follow-up with Dr. Mikal Planeabell for ongoing prescriptions of your Tylenol 3.  If you need a referral to chronic pain management for these to be filled please let us know

## 2016-12-13 ENCOUNTER — Encounter: Payer: Self-pay | Admitting: Sports Medicine

## 2016-12-14 ENCOUNTER — Telehealth: Payer: Self-pay | Admitting: Pharmacist

## 2016-12-14 NOTE — Telephone Encounter (Signed)
Patient's daughter, [name], called with several questions and concerns:  1. High CBGs - daughter is concerned that CBGs are running too high and wants to adjust insulin. Upon review of CBG log the last several days of readings are listed below:  Fasting: generally ~130s-180s: 188, 143, 147, 184, 139, 72, 136, 164, 118, 96, 170, 114 Pre-lunch: generally 110-250s: 303, 346, 254, 132, 100, 269, 174, 111, 173 Pre-supper: generally 130s-180s: 252, 188, 138, 110, 131, 130, 184 Bedtime: generally 100s-240s: 107, 236, 124, 246, 99, 234, 149, 344, 272, 140, 262  Generally, patient is using the following insulin dosing:  Insulin NPH 15 units qAM and 5-10 units qPM. If CBGs 120-150 at bedtime, will only give 5 units; if <120, will give 0 units .  Insulin regular 3 units with breakfast, 0-6 units with lunch and supper. Will give 0 units if CBGs are <200 at lunch or <180 at supper. She is more active in the afternoon either swimming or doing Tai Chi and has a h/o hypoglycemia with exercise, thus will sometimes give 0 units with lunch.  Patient often takes prandial insulin AFTER lunch as she has "throat problems" and cannot always keep her food down. She will sometimes forget her prandial insulin altogether (she lives alone, though her daughter is often with her) and those are the days she tends to run high.   2. Daily NSAID - Patient's daughter admits that she has been putting Ibuprofen 400 mg in AM pill box daily. This is concerning given concomitant dx of CKD III. Daughter seems to think benefit>risk even after explaining mechanism of risk to kidney. She reports her surgeon wants her to take a daily antiinflammatory and acknowledged the risk of daily NSAIDs. She inquires about turmeric's efficacy.   3. Letter from [facility] Management requesting labs - daughter states that they received a letter and confirmed over the phone that Care Managers wish patient to have labs drawn specific to CKD (daughter listed PTH,  Ca, Phos specifically). They have an appointment scheduled with Dr. Derrel Nip on Wednesday.  ASSESSMENT/PLAN:  #CBGs - I am not incredibly concerned over last two days of higher CBGs and do not think she needs an adjustment in insulin at this time. I do think she needs to "stick" to her prescribed regimen of NPH 15 qAM, 5-10 qPM and only hold bedtime NPH if CBG <120; Regular 3 units with breakfast, 0-6 with lunch and supper, only hold if BG <120 at supper and she is NOT eating Encouraged adherence and asked daughter to bring CBG log into scheduled office visit next Wednesday with Dr. Derrel Nip to discuss.   #Daily NSAID - I am not comfortable supporting daughter's decision to schedule daily NSAIDs to control pain given CKD III. Explained risks to daughter and firmly advised against it until she spoke with patient's PCP. Instead, recommended d/c ibuprofen and try turmeric (nature made brand) 500 mg 2-3 times daily as this is a generally safe product and does have some data to support efficacy in pain control with inflammatory conditions. This is a reinforcement of Dr. Nicolasa Ducking advice as well. Daughter expresses understanding, states they will implement this and is to bring this up with Dr. Derrel Nip.   #CKD workup - encouraged patient's daughter to keep appointment and praised her for acting quickly. Ensured her that patient is in excellent care with Dr. Derrel Nip.     Unfortunately, patient's daughter often adjusts patient's insulin dosing and medications without consulting her providers, which is something we discussed  today. Daughter states "I'm a nurse, I know the risks" several times, but ultimately seemed to agree with my suggestions. Will route this note to Dr. Derrel Nip for her review prior to the patient's scheduled appointment.   Carlean Jews, Pharm.D., BCPS PGY2 Ambulatory Care Pharmacy Resident Phone: [phone]

## 2016-12-15 DIAGNOSIS — R69 Illness, unspecified: Secondary | ICD-10-CM | POA: Diagnosis not present

## 2016-12-19 ENCOUNTER — Ambulatory Visit: Payer: Medicare HMO | Admitting: Internal Medicine

## 2016-12-19 ENCOUNTER — Encounter: Payer: Self-pay | Admitting: Internal Medicine

## 2016-12-19 VITALS — BP 128/62 | HR 84 | Temp 97.9°F | Resp 16 | Ht 66.0 in | Wt 160.2 lb

## 2016-12-19 DIAGNOSIS — N183 Chronic kidney disease, stage 3 unspecified: Secondary | ICD-10-CM

## 2016-12-19 DIAGNOSIS — E1142 Type 2 diabetes mellitus with diabetic polyneuropathy: Secondary | ICD-10-CM

## 2016-12-19 NOTE — Patient Instructions (Signed)
Return for labs on Dec 20th   I agree with using natural anti inflammatories.  We will follow her kidney function closely  No changes to insulin for now

## 2016-12-19 NOTE — Progress Notes (Signed)
Subjective:  Patient ID: Amy Calhoun, female    DOB: 09/11/1928  Age: 83 y.o. MRN: [id number]  CC: The primary encounter diagnosis was CKD (chronic kidney disease) stage 3, GFR 30-59 ml/min (HCC). A diagnosis of DM type 2 with diabetic peripheral neuropathy (HCC) was also pertinent to this visit.  HPI Amy Calhoun presents for FOLLOW UP ON A LETTER she received from her insuracne company stating that she had CKD and needed additional labs (PTH, et). She is accompanied by her daughter Amy Calhoun.  Patient is HOH and llves in a senior retirement community dwelling  [place]  She has two supportive daughters nearby,  But her daughter [place] lived with[place]   She underwent bilateral hemilaminectomy  Of L3,4,5 and left L5 S1 hemilamand decompresion on July 28 by Coletta MemosKyle Cabbell . The sciatica has been relieved. She is exercising now regularly at [employer] [facility] and is using the pool   4-5 times per week.  Takes tylenol #3 for pain now,  Not using  nsaids.    Cr has improved since July with dc NSAIDs.    [facility] max of  2 [facility].   25 minute  discussion of condition, medications,  Lifestyle and expectations with daughter and patient   Lab Results  Component Value Date   CHOL 114 03/29/2016   HDL 50 03/29/2016   LDLCALC 44 03/29/2016   TRIG 100 03/29/2016   CHOLHDL 2.3 03/29/2016     Has diabetes. Lab Results  Component Value Date   HGBA1C 6.5 10/03/2016      Lab Results  Component Value Date   CREATININE 1.13 10/02/2016     Outpatient Medications Prior to Visit  Medication Sig Dispense Refill  . acetaminophen-codeine (TYLENOL #3) 300-30 MG tablet Take 1 tablet by mouth every 6 (six) hours as needed for moderate pain. 30 tablet 0  . acidophilus (RISAQUAD) CAPS capsule Take 1 capsule by mouth daily.    Marland Kitchen. albuterol (PROVENTIL HFA;VENTOLIN HFA) 108 (90 Base) MCG/ACT inhaler Inhale 2  puffs into the lungs every 6 (six) hours as needed for wheezing or shortness of breath. 1 Inhaler 2  . albuterol (PROVENTIL) (2.5 MG/3ML) 0.083% nebulizer solution Take 3 mLs (2.5 mg total) by nebulization every 6 (six) hours as needed for wheezing or shortness of breath. 75 mL 12  . amitriptyline (ELAVIL) 75 MG tablet TAKE 2 TABLETS AT BEDTIME 90 tablet 0  . Calcium Citrate-Vitamin D (CALCIUM + D PO) Take 1 tablet by mouth daily.    . Coenzyme Q10 (CO Q 10) 100 MG CAPS Take 1 capsule by mouth daily.    . Continuous Blood Gluc Sensor (FREESTYLE LIBRE SENSOR SYSTEM) MISC Please dispense 3 sensors monthly 3 each 11  . CRANBERRY PO Take 1 tablet by mouth every Monday, Wednesday, and Friday.    . docusate sodium (COLACE) 100 MG capsule Take 1 capsule (100 mg total) by mouth 2 (two) times daily. 10 capsule 0  . gabapentin (NEURONTIN) 100 MG capsule TAKE 2 CAPSULES THREE TIMES A DAY 540 capsule 1  . glucagon (GLUCAGON EMERGENCY) 1 MG injection Inject 1 mg into the vein once as needed. For hypoglycemic episodes. E11.42 1 each 3  . glucose blood (FREESTYLE PRECISION NEO TEST) test strip Test blood sugar once 3 times daily 100 each 12  . ibuprofen (ADVIL,MOTRIN) 600 MG tablet Take 1 tablet (600 mg total) by mouth 2 (  two) times daily as needed. 60 tablet 1  . insulin NPH Human (HUMULIN N,NOVOLIN N) 100 UNIT/ML injection 20 units every morning and 10 units at night 30 mL 1  . insulin regular (NOVOLIN R,HUMULIN R) 100 units/mL injection 3 units before breakfast , 3 units before lunch , supper: 0 units for CBG 120 or lower, 3 units for CBG 121-199, 6 units for CBG 200 or higher Please dispense 90 day supply 10 mL 0  . levothyroxine (SYNTHROID, LEVOTHROID) 150 MCG tablet Take 1 tablet (150 mcg total) by mouth daily. 90 tablet 3  . lidocaine (LIDODERM) 5 % PLACE 1 PATCH ON THE SKIN DAILY 90 patch 1  . linagliptin (TRADJENTA) 5 MG TABS tablet Take 1 tablet (5 mg total) by mouth daily. 90 tablet 1  . lisinopril  (PRINIVIL,ZESTRIL) 10 MG tablet Take 1 tablet (10 mg total) by mouth daily. 90 tablet 3  . omeprazole (PRILOSEC) 40 MG capsule Take 1 capsule (40 mg total) by mouth daily. 90 capsule 3  . pyridOXINE (VITAMIN B-6) 100 MG tablet Take 100 mg by mouth daily.    . riboflavin (VITAMIN B-2) 100 MG TABS tablet Take 100 mg by mouth daily.    Marland Kitchen. senna-docusate (SENOKOT-S) 8.6-50 MG tablet Take 1 tablet by mouth at bedtime. 30 tablet 5  . simvastatin (ZOCOR) 40 MG tablet Take 40 mg by mouth at bedtime.     . Continuous Blood Gluc Receiver (FREESTYLE LIBRE READER) DEVI 1 Device by Does not apply route once. 1 Device 0   Facility-Administered Medications Prior to Visit  Medication Dose Route Frequency Provider Last Rate Last Dose  . 0.9 %  sodium chloride infusion  500 mL Intravenous Continuous Rachael FeeJacobs, Daniel P, MD        Review of Systems;  Patient denies headache, fevers, malaise, unintentional weight loss, skin rash, eye pain, sinus congestion and sinus pain, sore throat, dysphagia,  hemoptysis , cough, dyspnea, wheezing, chest pain, palpitations, orthopnea, edema, abdominal pain, nausea, melena, diarrhea, constipation, flank pain, dysuria, hematuria, urinary  Frequency, nocturia, numbness, tingling, seizures,  Focal weakness, Loss of consciousness,  Tremor, insomnia, depression, anxiety, and suicidal ideation.      Objective:  BP 128/62 (BP Location: Left Arm, Patient Position: Sitting, Cuff Size: Normal)   Pulse 84   Temp 97.9 F (36.6 C) (Oral)   Resp 16   Ht 5\' 6"  (1.676 m)   Wt 160 lb 3.2 oz (72.7 kg)   BMI 25.86 kg/m   BP Readings from Last 3 Encounters:  12/19/16 128/62  12/12/16 (!) 142/70  10/31/16 (!) 150/90    Wt Readings from Last 3 Encounters:  12/19/16 160 lb 3.2 oz (72.7 kg)  12/12/16 160 lb (72.6 kg)  10/31/16 160 lb 3.2 oz (72.7 kg)    General appearance: alert, cooperative and appears stated age Ears: normal TM's and external ear canals both ears Throat: lips,  mucosa, and tongue normal; teeth and gums normal Neck: no adenopathy, no carotid bruit, supple, symmetrical, trachea midline and thyroid not enlarged, symmetric, no tenderness/mass/nodules Back: symmetric, no curvature. ROM normal. No CVA tenderness. Lungs: clear to auscultation bilaterally Heart: regular rate and rhythm, S1, S2 normal, no murmur, click, rub or gallop Abdomen: soft, non-tender; bowel sounds normal; no masses,  no organomegaly Pulses: 2+ and symmetric Skin: Skin color, texture, turgor normal. No rashes or lesions Lymph nodes: Cervical, supraclavicular, and axillary nodes normal.  Lab Results  Component Value Date   HGBA1C 6.5 10/03/2016   HGBA1C 6.9 (  H) 07/06/2016   HGBA1C 7.4 (H) 03/30/2016    Lab Results  Component Value Date   CREATININE 1.13 10/02/2016   CREATININE 1.40 (H) 08/07/2016   CREATININE 0.98 07/13/2016    Lab Results  Component Value Date   WBC 8.6 08/07/2016   HGB 12.0 08/07/2016   HCT 38.6 08/07/2016   PLT 214 08/07/2016   GLUCOSE 124 (H) 10/02/2016   CHOL 114 03/29/2016   TRIG 100 03/29/2016   HDL 50 03/29/2016   LDLCALC 44 03/29/2016   ALT 12 07/06/2016   AST 14 07/06/2016   NA 141 10/02/2016   K 4.9 10/02/2016   CL 107 10/02/2016   CREATININE 1.13 10/02/2016   BUN 24 (H) 10/02/2016   CO2 26 10/02/2016   TSH 1.22 07/06/2016   HGBA1C 6.5 10/03/2016    Mr Lumbar Spine W Wo Contrast  Result Date: 10/07/2016 CLINICAL DATA:  Lumbar stenosis with neurogenic claudication EXAM: MRI LUMBAR SPINE WITHOUT AND WITH CONTRAST TECHNIQUE: Multiplanar and multiecho pulse sequences of the lumbar spine were obtained without and with intravenous contrast. CONTRAST:  8mL MULTIHANCE GADOBENATE DIMEGLUMINE 529 MG/ML IV SOLN COMPARISON:  Lumbar MRI 06/20/2016 FINDINGS: Segmentation:  Normal Alignment:  Mild retrolisthesis L2-3 Vertebrae: Negative for fracture or mass. Discogenic endplate changes L3-4 L4-5 and L5-S1. Conus medullaris: Extends to the L1-2  level and appears normal. Paraspinal and other soft tissues: Postsurgical enhancing scar tissue posteriorly. No fluid collection or mass. Disc levels: L1-2:  Negative L2-3: Mild retrolisthesis. Mild disc and facet degeneration without stenosis L3-4: Advanced disc degeneration and spurring. Bilateral laminotomy with enhancing scar tissue around the thecal sac. Mild spinal stenosis, improved following posterior decompression. Subarticular stenosis bilaterally. Moderate foraminal encroachment bilaterally right greater than left, unchanged from the prior MRI. L4-5: Advanced disc degeneration with diffuse endplate spurring. Bilateral laminotomy with enhancing scar tissue around the thecal sac bilaterally. Mild spinal stenosis, improved following posterior decompression. Moderate foraminal encroachment bilaterally, without interval change since the prior MRI L5-S1: Advanced disc degeneration and spondylosis with diffuse endplate spurring. Bilateral facet hypertrophy. Left laminectomy. Hypertrophy of the right ligamentum flavum. Moderate spinal stenosis, with mild interval improvement. Moderate left foraminal encroachment, unchanged. IMPRESSION: Postop laminotomy at L3-4 with improvement in spinal stenosis. Moderate foraminal encroachment right greater than left unchanged Mild spinal stenosis L4-5 improved following posterior decompression. Moderate foraminal encroachment bilaterally without interval change Left laminectomy L5-S1 with mild improvement in stenosis. No change in moderate left foraminal encroachment. Electronically Signed   By: Marlan Palau M.D.   On: 10/07/2016 13:26    Assessment & Plan:   Problem List Items Addressed This Visit    CKD (chronic kidney disease) stage 3, GFR 30-59 ml/min (HCC) - Primary    GFR has improved since medications were changed  Will repeat cr with PTH in 2 weeks.  Continue to avoid NSAIDs       Relevant Orders   PTH, Intact and Calcium   Comprehensive metabolic panel     DM type 2 with diabetic peripheral neuropathy (HCC)    Has not had more hypoglycemic episodes. Patient declines DM nutritionist referral.  Home readings slightly higher than 130 fasting but given age and most recent A1C will  maintain on current regimen with close collaboration with Rayfield Citizen, pharmacist. CGM ordered.  Lab Results  Component Value Date   HGBA1C 6.5 10/03/2016         Relevant Orders   Microalbumin / creatinine urine ratio   Hemoglobin A1c      I  am having Carroll M. Sokoloski maintain her simvastatin, lisinopril, omeprazole, pyridOXINE, riboflavin, glucagon, docusate sodium, senna-docusate, levothyroxine, linagliptin, insulin NPH Human, lidocaine, acidophilus, CRANBERRY PO, albuterol, albuterol, Co Q 10, Calcium Citrate-Vitamin D (CALCIUM + D PO), acetaminophen-codeine, insulin regular, FREESTYLE LIBRE READER, FREESTYLE LIBRE SENSOR SYSTEM, glucose blood, ibuprofen, gabapentin, and amitriptyline. We will continue to administer sodium chloride.  No orders of the defined types were placed in this encounter.   There are no discontinued medications.  Follow-up: Return in about 3 months (around 03/19/2017) for with margarett,  labs dec 20 .   Sherlene Shams, MD

## 2016-12-21 NOTE — Assessment & Plan Note (Addendum)
Has not had more hypoglycemic episodes. Patient declines DM nutritionist referral.  Home readings slightly higher than 130 fasting but given age and most recent A1C will  maintain on current regimen with close collaboration with Rayfield Citizenaroline, pharmacist. CGM ordered.  Lab Results  Component Value Date   HGBA1C 6.5 10/03/2016

## 2016-12-21 NOTE — Assessment & Plan Note (Signed)
GFR has improved since medications were changed  Will repeat cr with PTH in 2 weeks.  Continue to avoid NSAIDs

## 2017-01-02 ENCOUNTER — Other Ambulatory Visit (INDEPENDENT_AMBULATORY_CARE_PROVIDER_SITE_OTHER): Payer: Medicare HMO

## 2017-01-02 ENCOUNTER — Telehealth: Payer: Self-pay | Admitting: Radiology

## 2017-01-02 DIAGNOSIS — E1142 Type 2 diabetes mellitus with diabetic polyneuropathy: Secondary | ICD-10-CM | POA: Diagnosis not present

## 2017-01-02 DIAGNOSIS — E039 Hypothyroidism, unspecified: Secondary | ICD-10-CM

## 2017-01-02 DIAGNOSIS — N183 Chronic kidney disease, stage 3 unspecified: Secondary | ICD-10-CM

## 2017-01-02 LAB — COMPREHENSIVE METABOLIC PANEL
ALBUMIN: 4.1 g/dL (ref 3.5–5.2)
ALK PHOS: 67 U/L (ref 39–117)
ALT: 12 U/L (ref 0–35)
AST: 16 U/L (ref 0–37)
BUN: 27 mg/dL — AB (ref 6–23)
CALCIUM: 8.8 mg/dL (ref 8.4–10.5)
CHLORIDE: 101 meq/L (ref 96–112)
CO2: 26 mEq/L (ref 19–32)
Creatinine, Ser: 1.08 mg/dL (ref 0.40–1.20)
GFR: 51.09 mL/min — AB (ref >60.00)
Glucose, Bld: 208 mg/dL — ABNORMAL HIGH (ref 70–99)
POTASSIUM: 4.7 meq/L (ref 3.5–5.1)
SODIUM: 136 meq/L (ref 135–145)
TOTAL PROTEIN: 7.3 g/dL (ref 6.0–8.3)
Total Bilirubin: 0.8 mg/dL (ref 0.2–1.2)

## 2017-01-02 LAB — TSH: TSH: 1.57 u[IU]/mL (ref 0.35–4.50)

## 2017-01-02 LAB — HEMOGLOBIN A1C: HEMOGLOBIN A1C: 7.2 % — AB (ref 4.6–6.5)

## 2017-01-02 LAB — MICROALBUMIN / CREATININE URINE RATIO
Creatinine,U: 57.3 mg/dL
Microalb Creat Ratio: 1.5 mg/g (ref 0.0–30.0)
Microalb, Ur: 0.8 mg/dL (ref 0.0–1.9)

## 2017-01-02 NOTE — Addendum Note (Signed)
Addended by: Warden FillersWRIGHT, Babbette Dalesandro S on: 01/02/2017 11:59 AM   Modules accepted: Orders

## 2017-01-02 NOTE — Addendum Note (Signed)
Addended by: Penne LashWIGGINS, Zerina Hallinan N on: 01/02/2017 11:58 AM   Modules accepted: Orders

## 2017-01-02 NOTE — Telephone Encounter (Signed)
Pt came in for labs today. Order for TSH placed by Dr. Adriana Simasook back in April. Would you like TSH ran as well with other labs? If so, could you place future order. Thank you

## 2017-01-02 NOTE — Addendum Note (Signed)
Addended by: Sherlene ShamsULLO, Oakes Mccready L on: 01/02/2017 11:52 AM   Modules accepted: Orders

## 2017-01-02 NOTE — Addendum Note (Signed)
Addended by: Warden FillersWRIGHT, Amato Sevillano S on: 01/02/2017 01:52 PM   Modules accepted: Orders

## 2017-01-02 NOTE — Telephone Encounter (Signed)
tsh added  Thank you

## 2017-01-03 LAB — PTH, INTACT AND CALCIUM
Calcium: 9.1 mg/dL (ref 8.6–10.4)
PTH: 17 pg/mL (ref 14–64)

## 2017-01-07 ENCOUNTER — Other Ambulatory Visit: Payer: Self-pay

## 2017-01-07 ENCOUNTER — Ambulatory Visit: Payer: Self-pay | Admitting: *Deleted

## 2017-01-07 MED ORDER — ALBUTEROL SULFATE HFA 108 (90 BASE) MCG/ACT IN AERS: 2.0000 | INHALATION_SPRAY | Freq: Four times a day (QID) | RESPIRATORY_TRACT | 2 refills | Status: DC | PRN

## 2017-01-07 NOTE — Telephone Encounter (Signed)
Please advise 

## 2017-01-07 NOTE — Telephone Encounter (Signed)
Inhaler has been refilled

## 2017-01-07 NOTE — Telephone Encounter (Signed)
Daughter called in concerned with mother having a sore throat for 4-5 days.  Daughter was requesting the doctor call in an antibiotic for her mother's sore throat.  Mother is in the background answering  questions. 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   Her mother does not have a fever, is keeping fluids and food down without difficulty, no other s/s of upper respiratory infection.   Her O2 sat is her normal of 93-94%.    She is using Chloraspetic throat lozenges.   3 days ago her throat was more red but now it is pink.   No pus on tonsils.  Just occasional yellow mucus in back of throat.  She is taking a sinus medicine which seems to be helping.   Her glucose is 149 this morning .  She has taken her insulin and is eating breakfast without difficulty. Daughter is requesting a refill on the Albuterol inhaler.   She can't find her mother's inhaler. Home Care Advice given.  Does not meet the criteria for needing an antibiotic per the protocol.    Daughter verbalized understanding.  Instructed to call us back if her symptoms become worse and since it's the Christmas holiday she may need to go to the urgent care or ED if she becomes worse over the holiday.   Daughter verbalized understanding.  I routed a note to the nurse pool requesting an Albuterol inhaler refill.  Reason for Disposition . [1] Sore throat is the only symptom AND [2] sore throat present < 48 hours  Answer Assessment - Initial Assessment Questions 1. ONSET: "When did the throat start hurting?" (Hours or days ago)      4-5 days has a sore throat. 2. SEVERITY: "How bad is the sore throat?" (Scale 1-10; mild, moderate or severe)   - MILD (1-3):  doesn't interfere with eating or normal activities   - MODERATE (4-7): interferes with eating some solids and normal activities   - SEVERE (8-10):  excruciating pain, interferes with most normal activities   - SEVERE DYSPHAGIA: can't swallow liquids, drooling     It was dark red 3 days ago but now pink. 3. STREP  EXPOSURE: "Has there been any exposure to strep within the past week?" If so, ask: "What type of contact occurred?"      No.   Lives in retirement home.   But around other people. 4.  VIRAL SYMPTOMS: "Are there any symptoms of a cold, such as a runny nose, cough, hoarse voice or red eyes?"      No cough but occasionally.  No other symptoms.  No fever.    5. FEVER: "Do you have a fever?" If so, ask: "What is your temperature, how was it measured, and when did it start?"     No 6. PUS ON THE TONSILS: "Is there pus on the tonsils in the back of your throat?"     O2 sat 94%.  Pulse 94.    Behind the epiglottis there is some yellow mucus.  Nothing on tonsils. 7. OTHER SYMPTOMS: "Do you have any other symptoms?" (e.g., difficulty breathing, headache, rash)     No other symptoms 8. PREGNANCY: "Is there any chance you are pregnant?" "When was your last menstrual period?"     Not asked due to age  Protocols used: SORE THROAT-A-AH

## 2017-01-14 ENCOUNTER — Encounter: Payer: Self-pay | Admitting: Pharmacist

## 2017-01-19 ENCOUNTER — Other Ambulatory Visit: Payer: Self-pay | Admitting: Family Medicine

## 2017-01-19 ENCOUNTER — Other Ambulatory Visit: Payer: Self-pay | Admitting: Family

## 2017-01-19 DIAGNOSIS — E1142 Type 2 diabetes mellitus with diabetic polyneuropathy: Secondary | ICD-10-CM

## 2017-01-22 ENCOUNTER — Telehealth: Payer: Self-pay | Admitting: Family

## 2017-01-22 NOTE — Telephone Encounter (Signed)
Copied from CRM [id number]. Topic: Quick Communication - See Telephone Encounter >> Jan 22, 2017  9:43 AM Everardo PacificMoton, Leighton Brickley, NT wrote: CRM for notification. See Telephone encounter for: Patients daughter called because her mother needs a rill on her Amitriptyline, Humulin Insulin , and Tradjenta. Daughter also stated that she would like the medication to come through express script. If someone could give them a call at [phone]  01/22/17.

## 2017-02-07 ENCOUNTER — Telehealth: Payer: Self-pay | Admitting: Family

## 2017-02-07 ENCOUNTER — Telehealth: Payer: Self-pay

## 2017-02-07 NOTE — Telephone Encounter (Signed)
Copied from CRM #[id number]. Topic: General - Other >> Feb 07, 2017 10:54 AM Golden, Tashia, RMA wrote: Reason for CRM: pt daughter called and stated that a PA is needed for her test strips and that her mother is currently out of them but the pharmacy has sent a form today Pt daughter stated that her mother tests 4 times daily Please contact her once the PA form has been received and filled out Emma can be reached @[phone]  

## 2017-02-07 NOTE — Telephone Encounter (Signed)
Copied from CRM [phone]. Topic: General - Other >> Feb 07, 2017 10:54 AM Lelon FrohlichGolden, Joeph Szatkowski, RMA wrote: Reason for CRM: pt daughter called and stated that a PA is needed for her test strips and that her mother is currently out of them but the pharmacy has sent a form today Pt daughter stated that her mother tests 4 times daily Please contact her once the PA form has been received and filled out [name] can be reached [phone] 

## 2017-02-07 NOTE — Telephone Encounter (Signed)
Please advise 

## 2017-02-07 NOTE — Telephone Encounter (Signed)
Have not received PA paperwork as of yet.

## 2017-02-08 ENCOUNTER — Telehealth: Payer: Self-pay | Admitting: Pharmacist

## 2017-02-08 DIAGNOSIS — E1142 Type 2 diabetes mellitus with diabetic polyneuropathy: Secondary | ICD-10-CM

## 2017-02-08 DIAGNOSIS — R69 Illness, unspecified: Secondary | ICD-10-CM | POA: Diagnosis not present

## 2017-02-08 MED ORDER — GLUCOSE BLOOD VI STRP: ORAL_STRIP | 12 refills | Status: DC

## 2017-02-08 NOTE — Telephone Encounter (Signed)
Issue with insurance claim for test strips resolved after speaking with patient's insurance company. Patient's daughter requests prescription for glucose test strips up to 6 times daily. After speaking with Monia Pouchetna, they state that she is approved for up to 900 strips/180days.   Will pend prescription for testing blood glucose up to 5 times daily as this is max allowable on her insurance coverage. Will route this note to Dr. Darrick Huntsmanullo in Margaret's absence.   Allena Katzaroline E Ellory Khurana, Pharm.D., BCPS PGY2 Ambulatory Care Pharmacy Resident Phone: [phone]

## 2017-02-08 NOTE — Telephone Encounter (Signed)
  I have reviewed the above information and agree with above.   Teresa [name], MD 

## 2017-02-11 ENCOUNTER — Telehealth: Payer: Self-pay | Admitting: Family

## 2017-02-11 NOTE — Telephone Encounter (Signed)
[employer] calling [place] for additional test strips. [phone].

## 2017-02-11 NOTE — Telephone Encounter (Signed)
Copied from CRM [id number]. Topic: General - Other >> Feb 11, 2017  2:56 PM Stephannie LiSimmons, Janett L, NT wrote: Reason for CRM: [employer]  precert called and would like a call back [phone]

## 2017-02-11 NOTE — Telephone Encounter (Signed)
Please advise 

## 2017-03-06 ENCOUNTER — Ambulatory Visit (INDEPENDENT_AMBULATORY_CARE_PROVIDER_SITE_OTHER): Payer: Medicare HMO

## 2017-03-06 VITALS — BP 120/68 | HR 70 | Temp 97.6°F | Resp 15 | Ht 63.0 in | Wt 155.1 lb

## 2017-03-06 DIAGNOSIS — Z0000 Encounter for general adult medical examination without abnormal findings: Secondary | ICD-10-CM

## 2017-03-06 NOTE — Progress Notes (Addendum)
Subjective:   Amy Calhoun is a 84 y.o. female who presents for an Initial Medicare Annual Wellness Visit.  Review of Systems    No ROS.  Medicare Wellness Visit. Additional risk factors are reflected in the social history.  Cardiac Risk Factors include: advanced age (>64men, >47 women);diabetes mellitus;hypertension     Objective:    Today's Vitals   03/06/17 1040  BP: 120/68  Pulse: 70  Resp: 15  Temp: 97.6 F (36.4 C)  TempSrc: Oral  SpO2: 94%  Weight: 155 lb 1.9 oz (70.4 kg)  Height: 5\' 3"  (1.6 m)   Body mass index is 27.48 kg/m.  Advanced Directives 03/06/2017 08/07/2016 07/12/2016 07/12/2016 07/12/2016 03/29/2016  Does Patient Have a Medical Advance Directive? Yes Yes No No No No  Type of Estate agent of [place];Living will Healthcare Power of [place];Living will - - - -  Does patient want to make changes to medical advance directive? No - Patient declined No - Patient declined - - - -  Copy of Healthcare Power of Attorney in Chart? Yes - - - - -  Would patient like information on creating a medical advance directive? - - No - Patient declined - - No - Patient declined    Current Medications (verified) Outpatient Encounter Medications as of 03/06/2017  Medication Sig  . acetaminophen-codeine (TYLENOL #3) 300-30 MG tablet Take 1 tablet by mouth every 6 (six) hours as needed for moderate pain.  Marland Kitchen acidophilus (RISAQUAD) CAPS capsule Take 1 capsule by mouth daily.  Marland Kitchen albuterol (PROVENTIL HFA;VENTOLIN HFA) 108 (90 Base) MCG/ACT inhaler Inhale 2 puffs into the lungs every 6 (six) hours as needed for wheezing or shortness of breath.  Marland Kitchen albuterol (PROVENTIL) (2.5 MG/3ML) 0.083% nebulizer solution Take 3 mLs (2.5 mg total) by nebulization every 6 (six) hours as needed for wheezing or shortness of breath.  Marland Kitchen amitriptyline (ELAVIL) 75 MG tablet TAKE 2 TABLETS AT BEDTIME  . Calcium Citrate-Vitamin D (CALCIUM + D PO) Take 1 tablet by mouth daily.  .  Coenzyme Q10 (CO Q 10) 100 MG CAPS Take 1 capsule by mouth daily.  . Continuous Blood Gluc Sensor (FREESTYLE LIBRE SENSOR SYSTEM) MISC Please dispense 3 sensors monthly  . CRANBERRY PO Take 1 tablet by mouth every Monday, Wednesday, and Friday.  . docusate sodium (COLACE) 100 MG capsule Take 1 capsule (100 mg total) by mouth 2 (two) times daily.  Marland Kitchen gabapentin (NEURONTIN) 100 MG capsule TAKE 2 CAPSULES THREE TIMES A DAY  . glucagon (GLUCAGON EMERGENCY) 1 MG injection Inject 1 mg into the vein once as needed. For hypoglycemic episodes. E11.42  . glucose blood test strip Use to test blood sugar up to 5 times daily.  Marland Kitchen ibuprofen (ADVIL,MOTRIN) 600 MG tablet Take 1 tablet (600 mg total) by mouth 2 (two) times daily as needed.  . insulin NPH Human (HUMULIN N) 100 UNIT/ML injection INJECT 20 UNITS EVERY MORNING AND 10 UNITS AT NIGHT  . insulin regular (NOVOLIN R,HUMULIN R) 100 units/mL injection 3 units before breakfast , 3 units before lunch , supper: 0 units for CBG 120 or lower, 3 units for CBG 121-199, 6 units for CBG 200 or higher Please dispense 90 day supply  . levothyroxine (SYNTHROID, LEVOTHROID) 150 MCG tablet Take 1 tablet (150 mcg total) by mouth daily.  Marland Kitchen lidocaine (LIDODERM) 5 % PLACE 1 PATCH ON THE SKIN DAILY  . lisinopril (PRINIVIL,ZESTRIL) 10 MG tablet Take 1 tablet (10 mg total) by mouth daily.  Marland Kitchen  omeprazole (PRILOSEC) 40 MG capsule Take 1 capsule (40 mg total) by mouth daily.  Marland Kitchen. pyridOXINE (VITAMIN B-6) 100 MG tablet Take 100 mg by mouth daily.  . riboflavin (VITAMIN B-2) 100 MG TABS tablet Take 100 mg by mouth daily.  Marland Kitchen. senna-docusate (SENOKOT-S) 8.6-50 MG tablet Take 1 tablet by mouth at bedtime.  . simvastatin (ZOCOR) 40 MG tablet Take 40 mg by mouth at bedtime.   . TRADJENTA 5 MG TABS tablet TAKE 1 TABLET DAILY  . Cholecalciferol (VITAMIN D) 2000 units tablet Take by mouth.  . Continuous Blood Gluc Receiver (FREESTYLE LIBRE READER) DEVI 1 Device by Does not apply route once.    Facility-Administered Encounter Medications as of 03/06/2017  Medication  . 0.9 %  sodium chloride infusion    Allergies (verified) Pravastatin   History: Past Medical History:  Diagnosis Date  . Chronic kidney disease 08/07/2016   Chronic renal failure, stage III  . Depression   . Esophageal dysphagia   . Gastric ulcer   . GERD (gastroesophageal reflux disease)   . Hyperlipidemia   . Hypertension   . Hypothyroidism   . IDDM (insulin dependent diabetes mellitus) (HCC)   . Ischemic colitis (HCC)   . Osteoarthritis   . Ulcer of esophagus    Past Surgical History:  Procedure Laterality Date  . ABDOMINAL HYSTERECTOMY    . ESOPHAGEAL DILATION  08/07/2016   usually a couple times a year, last time 07/30/16  . GALLBLADDER SURGERY    . LUMBAR LAMINECTOMY/DECOMPRESSION MICRODISCECTOMY N/A 08/10/2016   Procedure: BILATERAL HEMILAMINECTOMY LUMBAR THREE-FOUR,LUMBAR FOUR-FIVE,LEFT LUMBAR FIVE-SACRAL ONE HEMILAMINECTOMY AND DECOMPRESSION;  Surgeon: Coletta Memosabbell, Kyle, MD;  Location: [facility] OR;  Service: Neurosurgery;  Laterality: N/A;  . THYROIDECTOMY     Family History  Problem Relation Age of Onset  . Diabetes Mother   . Arthritis Mother   . Hyperlipidemia Mother   . Mental illness Mother   . Heart disease Father   . Mental illness Sister   . Arthritis Maternal Grandmother   . Arthritis Maternal Grandfather   . Colon cancer Neg Hx   . Stomach cancer Neg Hx   . Esophageal cancer Neg Hx    Social History   Socioeconomic History  . Marital status: Widowed    Spouse name: None  . Number of children: None  . Years of education: None  . Highest education level: None  Social Needs  . Financial resource strain: Not hard at all  . Food insecurity - worry: Never true  . Food insecurity - inability: Never true  . Transportation needs - medical: No  . Transportation needs - non-medical: No  Occupational History  . Occupation: retired  Tobacco Use  . Smoking status: Never Smoker  .  Smokeless tobacco: Never Used  Substance and Sexual Activity  . Alcohol use: No  . Drug use: No  . Sexual activity: No    Partners: Male  Other Topics Concern  . None  Social History Narrative  . None    Tobacco Counseling Counseling given: Not Answered   Clinical Intake:  Pre-visit preparation completed: Yes  Pain : No/denies pain     Nutritional Status: BMI 25 -29 Overweight Diabetes: Yes(Followed by PCP)  How often do you need to have someone help you when you read instructions, pamphlets, or other written materials from your doctor or pharmacy?: 1 - Never  Interpreter Needed?: No      Activities of Daily Living In your present state of health, do you have  any difficulty performing the following activities: 03/06/2017 08/07/2016  Hearing? Y Y  Comment Hearing aids hard of hearing-pt wears hearing aids  Vision? N N  Difficulty concentrating or making decisions? N N  Walking or climbing stairs? Y N  Comment Unsteady gait -  Dressing or bathing? N N  Doing errands, shopping? 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 N -  Using the Toilet? N -  In the past six months, have you accidently leaked urine? N -  Comment managed with a daily pad -  Do you have problems with loss of bowel control? N -  Managing your Medications? Y -  Comment daughter manages -  Managing your Finances? Y -  Comment daughter assists as needed -  Housekeeping or managing your Housekeeping? Y -  Comment staff assists  -  Some recent data might be hidden     Immunizations and Health Maintenance  There is no immunization history on file for this patient. Health Maintenance Due  Topic Date Due  . FOOT EXAM  09/15/1940  . OPHTHALMOLOGY EXAM  09/15/1940  . TETANUS/TDAP  09/15/1949  . PNA vac Low Risk Adult (1 of 2 - PCV13) 09/16/1995  . INFLUENZA VACCINE  08/15/2016    Patient Care Team: Allegra Grana, FNP as PCP - General (Family Medicine)  Indicate  any recent Medical Services you may have received from other than [facility] providers in the past year (date may be approximate).     Assessment:   This is a routine wellness examination for Petrea.  The goal of the wellness visit is to assist the patient how to close the gaps in care and create a preventative care plan for the patient.   The roster of all physicians providing medical care to patient is listed in the Snapshot section of the chart.  Taking calcium VIT D as appropriate/Osteoporosis risk reviewed.    Safety issues reviewed; lives alone in a senior community.  Smoke and carbon monoxide detectors in the home. No firearms or firearms locked in a safe within the home. Wears seatbelts when driving or riding with others. No violence in the home.  They do not have excessive sun exposure.  Discussed the need for sun protection: hats, long sleeves and the use of sunscreen if there is significant sun exposure.  Patient is alert, normal appearance, oriented to person/place/and time.  Correctly identified the president of the [place].  Can read correct time from watch face. Displays appropriate judgement.  No new identified risk were noted.  No failures at ADL's or IADL's.  Ambulates with cane/walker/scooter as needed.  BMI- discussed the importance of a healthy diet, water intake and the benefits of aerobic exercise. Educational material provided.   24 hour diet recall: Regular diet.   Daily fluid intake:  cups of caffeine, 2-4 cups of water.  Dental- dentures.  Diabetes- followed by PCP.  Medication management in progress, Devota Pace, [facility].  Sleep patterns- Sleeps 6-8 hours at night.  Wakes feeling rested.  Patient Concerns: None at this time. Follow up with PCP as needed.  Hearing/Vision screen Hearing Screening Comments: Hearing aid, bilateral Vision Screening Comments: Wears corrective lenses No retinopathy reported Cataract extraction, bilateral Visual acuity not  assessed per patient preference since they have regular follow up with the ophthalmologist  Dietary issues and exercise activities discussed: Current Exercise Habits: Structured exercise class, Type of exercise: calisthenics(water aerobics, swimming), Time (Minutes): 60, Frequency (Times/Week): 3,  Weekly Exercise (Minutes/Week): 180, Intensity: Mild  Goals    . Maintain Healthy Lifestyle     Stay hydrated Exercise Consistent meal times, healthy choices      Depression Screen PHQ 2/9 Scores 03/06/2017 10/03/2016 01/19/2016  PHQ - 2 Score 0 0 1    Fall Risk Fall Risk  03/06/2017 10/03/2016 01/19/2016  Falls in the past year? No No Yes  Number falls in past yr: - - 2 or more  Injury with Fall? - - Yes  Risk Factor Category  - - High Fall Risk    Cognitive Function:     6CIT Screen 03/06/2017  What Year? 0 points  What month? 0 points  What time? 0 points  Count back from 20 0 points  Months in reverse 0 points    Screening Tests Health Maintenance  Topic Date Due  . FOOT EXAM  09/15/1940  . OPHTHALMOLOGY EXAM  09/15/1940  . TETANUS/TDAP  09/15/1949  . PNA vac Low Risk Adult (1 of 2 - PCV13) 09/16/1995  . INFLUENZA VACCINE  08/15/2016  . HEMOGLOBIN A1C  07/03/2017  . DEXA SCAN  Completed      Plan:    End of life planning; Advance aging; Advanced directives discussed. Copy of current HCPOA/Living Will on file.    I have personally reviewed and noted the following in the patient's chart:   . Medical and social history . Use of alcohol, tobacco or illicit drugs  . Current medications and supplements . Functional ability and status . Nutritional status . Physical activity . Advanced directives . List of other physicians . Hospitalizations, surgeries, and ER visits in previous 12 months . Vitals . Screenings to include cognitive, depression, and falls . Referrals and appointments  In addition, I have reviewed and discussed with patient certain preventive protocols,  quality metrics, and best practice recommendations. A written personalized care plan for preventive services as well as general preventive health recommendations were provided to patient.     Ashok Pall, LPN   1/32/4401    Agree with plan. Rennie Plowman, NP

## 2017-03-06 NOTE — Patient Instructions (Addendum)
  [name] , Thank you for taking time to come for your Medicare Wellness Visit. I appreciate your ongoing commitment to your health goals. Please review the following plan we discussed and let me know if I can assist you in the future.   Follow up with Rennie PlowmanMargaret Arnett FNP as needed.    Check on previous immunizations given.  Make eye exam.  Have a great day!  These are the goals we discussed: Goals    . Maintain Healthy Lifestyle     Stay hydrated Exercise Consistent meal times, healthy choices       This is a list of the screening recommended for you and due dates:  Health Maintenance  Topic Date Due  . Complete foot exam   09/15/1940  . Eye exam for diabetics  09/15/1940  . Tetanus Vaccine  09/15/1949  . Pneumonia vaccines (1 of 2 - PCV13) 09/16/1995  . Flu Shot  08/15/2016  . Hemoglobin A1C  07/03/2017  . DEXA scan (bone density measurement)  Completed

## 2017-03-11 ENCOUNTER — Other Ambulatory Visit: Payer: Self-pay | Admitting: Family

## 2017-03-11 ENCOUNTER — Encounter

## 2017-03-11 MED ORDER — OMEPRAZOLE 20 MG PO CPDR
20 MG | ORAL_CAPSULE | ORAL | 3 refills | Status: AC
Start: 2017-03-11 — End: ?

## 2017-03-11 NOTE — Telephone Encounter (Signed)
Last OV 10/26/2016    Health Maintenance   Topic Date Due   . DTaP/Tdap/Td vaccine (1 - Tdap) 09/15/1949   . Shingles Vaccine (1 of 2 - 2 Dose Series) 09/15/1980   . Pneumococcal low/med risk (2 of 2 - PCV13) 01/31/2013   . TSH testing  03/30/2016   . Potassium monitoring  03/30/2016   . Creatinine monitoring  03/30/2016   . Flu vaccine (1) 09/15/2016             (applicable per patient's age: Cancer Screenings, Depression Screening, Fall Risk Screening, Immunizations)    Hemoglobin A1C (%)   Date Value   09/09/2015 9.0   02/07/2015 7.9   10/07/2014 8.2     Microalb/Crt. Ratio (mcg/mg creat)   Date Value   02/07/2015 13     LDL Cholesterol (mg/dL)   Date Value   19/14/7829 120     AST (U/L)   Date Value   03/31/2015 16     ALT (U/L)   Date Value   03/31/2015 14     BUN (mg/dL)   Date Value   56/21/3086 25 (H)      (goal A1C is < 7)   (goal LDL is <100) need 30-50% reduction from baseline     BP Readings from Last 3 Encounters:   10/27/15 (!) 120/50   10/18/15 130/64   10/13/15 130/60    (goal BP 120/80)      All Future Testing planned in [facility]:  Lab Frequency Next Occurrence       Next Visit Date:  No future appointments.         Patient Active Problem List:     IDDM (insulin dependent diabetes mellitus) (HCC)     Depression     Hyperlipidemia     Hypertension     Osteoarthritis     Hypothyroidism     Abdominal pain, epigastric     Gastric ulcer     CKD (chronic kidney disease) stage 3, GFR 30-59 ml/min (HCC)     Esophageal dysphagia     Ulcer of esophagus without bleeding     Dysphagia     Diabetes type 2, controlled (HCC)     Food impaction of esophagus     Esophagitis     Ischemic colitis (HCC)     Type 1 diabetes mellitus with diabetic polyneuropathy (HCC)     Hypoglycemia associated with diabetes (HCC)     Foreign body in esophagus

## 2017-03-13 ENCOUNTER — Other Ambulatory Visit: Payer: Self-pay

## 2017-03-13 DIAGNOSIS — K224 Dyskinesia of esophagus: Secondary | ICD-10-CM

## 2017-03-13 DIAGNOSIS — M48062 Spinal stenosis, lumbar region with neurogenic claudication: Secondary | ICD-10-CM

## 2017-03-13 DIAGNOSIS — G8929 Other chronic pain: Secondary | ICD-10-CM

## 2017-03-13 DIAGNOSIS — I10 Essential (primary) hypertension: Secondary | ICD-10-CM

## 2017-03-13 DIAGNOSIS — N183 Chronic kidney disease, stage 3 unspecified: Secondary | ICD-10-CM

## 2017-03-13 MED ORDER — IBUPROFEN 600 MG PO TABS: 600.0000 mg | ORAL_TABLET | Freq: Two times a day (BID) | ORAL | 0 refills | Status: DC | PRN

## 2017-03-20 ENCOUNTER — Other Ambulatory Visit: Payer: Self-pay

## 2017-03-20 DIAGNOSIS — R69 Illness, unspecified: Secondary | ICD-10-CM | POA: Diagnosis not present

## 2017-03-20 MED ORDER — LEVOTHYROXINE SODIUM 150 MCG PO TABS: 150.0000 ug | ORAL_TABLET | Freq: Every day | ORAL | 0 refills | Status: DC

## 2017-03-25 ENCOUNTER — Ambulatory Visit: Payer: Medicare HMO | Admitting: Family

## 2017-03-25 DIAGNOSIS — M48062 Spinal stenosis, lumbar region with neurogenic claudication: Secondary | ICD-10-CM | POA: Diagnosis not present

## 2017-03-27 ENCOUNTER — Ambulatory Visit: Payer: Medicare HMO | Admitting: Family

## 2017-03-27 ENCOUNTER — Encounter: Payer: Self-pay | Admitting: Family

## 2017-03-27 VITALS — BP 136/68 | HR 81 | Temp 97.4°F | Wt 157.6 lb

## 2017-03-27 DIAGNOSIS — R413 Other amnesia: Secondary | ICD-10-CM

## 2017-03-27 DIAGNOSIS — N390 Urinary tract infection, site not specified: Secondary | ICD-10-CM

## 2017-03-27 DIAGNOSIS — E1142 Type 2 diabetes mellitus with diabetic polyneuropathy: Secondary | ICD-10-CM | POA: Diagnosis not present

## 2017-03-27 NOTE — Patient Instructions (Addendum)
Ginkgo Biloba - may look into for memory.  Memory assessment today.  Follow up 3 months Will need to fasting labs - Lipid and A1c  Let me know if any low blood sugars.

## 2017-03-27 NOTE — Progress Notes (Signed)
Subjective:    Patient ID: Amy Calhoun, female    DOB: 08/04/1928, 84 y.o.   MRN: [id number]  CC: [name] Amy Calhoun is a 84 y.o. female who presents today for follow up.   HPI: Accompanied by daughter. Have come from gym today.  Overall doing well today.   DM- continues to follow with pharmacist, caroline. Take NPH 15 units qam and 10 units qpm. Novolog 3 units with each meals, will hold if BG < 150. Check BG TID. Reports two hypoglycemic episodes- the first occurred around 9pm and daughter thinks mom took NPH twice that morning as blood sugar ran low all day. BG at 9pm around 50. Responded to sugar gel. Another time after a workout , it was 65. Has now implemented checking blood sugar before workout and if < 150, she will give her mom a snack. Keeps emergency sugar gel with them. She has some concern with her mom's ability to take her morning medications. She is not concerned with dementia and states she is very alert- remembering places, people. She hasn't gotten lost, left pot on stove ect.   Recurrent uti- asymptomatic today. Would like urine checked     HISTORY:  Past Medical History:  Diagnosis Date  . Chronic kidney disease 08/07/2016   Chronic renal failure, stage III  . Depression   . Esophageal dysphagia   . Gastric ulcer   . GERD (gastroesophageal reflux disease)   . Hyperlipidemia   . Hypertension   . Hypothyroidism   . IDDM (insulin dependent diabetes mellitus) (HCC)   . Ischemic colitis (HCC)   . Osteoarthritis   . Ulcer of esophagus    Past Surgical History:  Procedure Laterality Date  . ABDOMINAL HYSTERECTOMY    . ESOPHAGEAL DILATION  08/07/2016   usually a couple times a year, last time 07/30/16  . GALLBLADDER SURGERY    . LUMBAR LAMINECTOMY/DECOMPRESSION MICRODISCECTOMY N/A 08/10/2016   Procedure: BILATERAL HEMILAMINECTOMY LUMBAR THREE-FOUR,LUMBAR FOUR-FIVE,LEFT LUMBAR FIVE-SACRAL ONE HEMILAMINECTOMY AND DECOMPRESSION;  Surgeon: Coletta Memosabbell, Kyle, MD;   Location: [facility] OR;  Service: Neurosurgery;  Laterality: N/A;  . THYROIDECTOMY     Family History  Problem Relation Age of Onset  . Diabetes Mother   . Arthritis Mother   . Hyperlipidemia Mother   . Mental illness Mother   . Heart disease Father   . Mental illness Sister   . Arthritis Maternal Grandmother   . Arthritis Maternal Grandfather   . Colon cancer Neg Hx   . Stomach cancer Neg Hx   . Esophageal cancer Neg Hx     Allergies: Pravastatin Current Outpatient Medications on File Prior to Visit  Medication Sig Dispense Refill  . acetaminophen-codeine (TYLENOL #3) 300-30 MG tablet Take 1 tablet by mouth every 6 (six) hours as needed for moderate pain. 30 tablet 0  . acidophilus (RISAQUAD) CAPS capsule Take 1 capsule by mouth daily.    Marland Kitchen. albuterol (PROVENTIL HFA;VENTOLIN HFA) 108 (90 Base) MCG/ACT inhaler Inhale 2 puffs into the lungs every 6 (six) hours as needed for wheezing or shortness of breath. 1 Inhaler 2  . albuterol (PROVENTIL) (2.5 MG/3ML) 0.083% nebulizer solution Take 3 mLs (2.5 mg total) by nebulization every 6 (six) hours as needed for wheezing or shortness of breath. 75 mL 12  . amitriptyline (ELAVIL) 75 MG tablet TAKE 2 TABLETS AT BEDTIME 90 tablet 0  . Calcium Citrate-Vitamin D (CALCIUM + D PO) Take 1 tablet by mouth daily.    . Cholecalciferol (VITAMIN D) 2000  units tablet Take by mouth.    . Coenzyme Q10 (CO Q 10) 100 MG CAPS Take 1 capsule by mouth daily.    . Continuous Blood Gluc Sensor (FREESTYLE LIBRE SENSOR SYSTEM) MISC Please dispense 3 sensors monthly 3 each 11  . CRANBERRY PO Take 1 tablet by mouth every Monday, Wednesday, and Friday.    . docusate sodium (COLACE) 100 MG capsule Take 1 capsule (100 mg total) by mouth 2 (two) times daily. 10 capsule 0  . gabapentin (NEURONTIN) 100 MG capsule TAKE 2 CAPSULES THREE TIMES A DAY 540 capsule 1  . glucagon (GLUCAGON EMERGENCY) 1 MG injection Inject 1 mg into the vein once as needed. For hypoglycemic episodes. E11.42  1 each 3  . glucose blood test strip Use to test blood sugar up to 5 times daily. 200 each 12  . ibuprofen (ADVIL,MOTRIN) 600 MG tablet Take 1 tablet (600 mg total) by mouth 2 (two) times daily as needed. 180 tablet 0  . insulin NPH Human (HUMULIN N) 100 UNIT/ML injection INJECT 20 UNITS EVERY MORNING AND 10 UNITS AT NIGHT 30 mL 1  . insulin regular (NOVOLIN R,HUMULIN R) 100 units/mL injection 3 units before breakfast , 3 units before lunch , supper: 0 units for CBG 120 or lower, 3 units for CBG 121-199, 6 units for CBG 200 or higher Please dispense 90 day supply 10 mL 0  . levothyroxine (SYNTHROID, LEVOTHROID) 150 MCG tablet Take 1 tablet (150 mcg total) by mouth daily. 90 tablet 0  . lidocaine (LIDODERM) 5 % PLACE 1 PATCH ON THE SKIN DAILY 90 patch 1  . lisinopril (PRINIVIL,ZESTRIL) 10 MG tablet Take 1 tablet (10 mg total) by mouth daily. 90 tablet 3  . omeprazole (PRILOSEC) 40 MG capsule Take 1 capsule (40 mg total) by mouth daily. 90 capsule 3  . pyridOXINE (VITAMIN B-6) 100 MG tablet Take 100 mg by mouth daily.    . riboflavin (VITAMIN B-2) 100 MG TABS tablet Take 100 mg by mouth daily.    Marland Kitchen senna-docusate (SENOKOT-S) 8.6-50 MG tablet Take 1 tablet by mouth at bedtime. 30 tablet 5  . simvastatin (ZOCOR) 40 MG tablet Take 40 mg by mouth at bedtime.     . TRADJENTA 5 MG TABS tablet TAKE 1 TABLET DAILY 90 tablet 1  . Turmeric 500 MG TABS Take 750 mg by mouth 2 (two) times daily.    . Continuous Blood Gluc Receiver (FREESTYLE LIBRE READER) DEVI 1 Device by Does not apply route once. 1 Device 0   Current Facility-Administered Medications on File Prior to Visit  Medication Dose Route Frequency Provider Last Rate Last Dose  . 0.9 %  sodium chloride infusion  500 mL Intravenous Continuous Rachael Fee, MD        Social History   Tobacco Use  . Smoking status: Never Smoker  . Smokeless tobacco: Never Used  Substance Use Topics  . Alcohol use: No  . Drug use: No    Review of Systems    Constitutional: Negative for chills and fever.  Respiratory: Negative for cough.   Cardiovascular: Negative for chest pain and palpitations.  Gastrointestinal: Negative for nausea and vomiting.  Genitourinary: Negative for dysuria and frequency.      Objective:    BP 136/68 (BP Location: Left Arm, Patient Position: Sitting, Cuff Size: Normal)   Pulse 81   Temp (!) 97.4 F (36.3 C) (Oral)   Wt 157 lb 9.6 oz (71.5 kg)   SpO2 97%  BMI 27.92 kg/m  BP Readings from Last 3 Encounters:  03/27/17 136/68  03/06/17 120/68  12/19/16 128/62   Wt Readings from Last 3 Encounters:  03/27/17 157 lb 9.6 oz (71.5 kg)  03/06/17 155 lb 1.9 oz (70.4 kg)  12/19/16 160 lb 3.2 oz (72.7 kg)    Physical Exam  Constitutional: She appears well-developed and well-nourished.  Eyes: Conjunctivae are normal.  Cardiovascular: Normal rate, regular rhythm, normal heart sounds and normal pulses.  Pulmonary/Chest: Effort normal and breath sounds normal. She has no wheezes. She has no rhonchi. She has no rales.  Neurological: She is alert.  Skin: Skin is warm and dry.  Psychiatric: She has a normal mood and affect. Her speech is normal and behavior is normal. Thought content normal.  Vitals reviewed.      Assessment & Plan:   Problem List Items Addressed This Visit      Endocrine   DM type 2 with diabetic peripheral neuropathy (HCC) - Primary    - last a1c 7.2 which is a very reasonable target for patient. Very pleased with daughter's knowledge. Some concern with patients forgetfulness. Daughter has plans to implement a daily pill box with morning, noon and night meds separated. She will maintain close vigilance and let me know of any further hypoglycemic episodes. Declines labs today- will do a1c at next f/u visit in 3 months.         Genitourinary   Recurrent urinary tract infection    Asymptomatic today. Patient is asymptomatic. She had no symptoms prior to hospitization last year. Pending ua.        Relevant Orders   Urinalysis, Routine w reflex microscopic   CULTURE, URINE COMPREHENSIVE     Other   Memory disturbance    pleased with mini mental status exam, 27/30. We will continue to follow over time. Discussed with daughter she may look into ginko biloba as supplement. Discussed safety, such as mother only using microwave which she is already doing. Will follow          I am having Graciemae M. Birchmeier maintain her simvastatin, lisinopril, omeprazole, pyridOXINE, riboflavin, glucagon, docusate sodium, senna-docusate, lidocaine, acidophilus, CRANBERRY PO, albuterol, Co Q 10, Calcium Citrate-Vitamin D (CALCIUM + D PO), acetaminophen-codeine, insulin regular, FREESTYLE LIBRE READER, FREESTYLE LIBRE SENSOR SYSTEM, gabapentin, albuterol, TRADJENTA, insulin NPH Human, glucose blood, Vitamin D, amitriptyline, ibuprofen, levothyroxine, and Turmeric. We will continue to administer sodium chloride.   No orders of the defined types were placed in this encounter.   Return precautions given.   Risks, benefits, and alternatives of the medications and treatment plan prescribed today were discussed, and patient expressed understanding.   Education regarding symptom management and diagnosis given to patient on AVS.  Continue to follow with Allegra Grana, FNP for routine health maintenance.   Yachet Mae [facility] and I agreed with plan.   Rennie Plowman, FNP

## 2017-03-27 NOTE — Assessment & Plan Note (Signed)
-   last a1c 7.2 which is a very reasonable target for patient. Very pleased with daughter's knowledge. Some concern with patients forgetfulness. Daughter has plans to implement a daily pill box with morning, noon and night meds separated. She will maintain close vigilance and let me know of any further hypoglycemic episodes. Declines labs today- will do a1c at next f/u visit in 3 months.

## 2017-03-27 NOTE — Assessment & Plan Note (Signed)
Asymptomatic today. Patient is asymptomatic. She had no symptoms prior to hospitization last year. Pending ua.

## 2017-03-27 NOTE — Assessment & Plan Note (Addendum)
pleased with mini mental status exam, 27/30. We will continue to follow over time. Discussed with daughter she may look into ginko biloba as supplement. Discussed safety, such as mother only using microwave which she is already doing. Will follow

## 2017-03-29 ENCOUNTER — Other Ambulatory Visit: Payer: Medicare HMO

## 2017-03-29 DIAGNOSIS — N390 Urinary tract infection, site not specified: Secondary | ICD-10-CM | POA: Diagnosis not present

## 2017-03-29 NOTE — Addendum Note (Signed)
Addended by: Penne LashWIGGINS, Nafeesah Lapaglia N on: 03/29/2017 01:26 PM   Modules accepted: Orders

## 2017-03-31 LAB — CULTURE, URINE COMPREHENSIVE
MICRO NUMBER:: 90331246
SPECIMEN QUALITY: ADEQUATE

## 2017-04-02 ENCOUNTER — Telehealth: Payer: Self-pay

## 2017-04-02 MED ORDER — LISINOPRIL 10 MG PO TABS: 10.0000 mg | ORAL_TABLET | Freq: Every day | ORAL | 1 refills | Status: DC

## 2017-04-02 NOTE — Progress Notes (Signed)
Patient came back and collected urine specimen

## 2017-04-03 ENCOUNTER — Encounter: Payer: Self-pay | Admitting: Family

## 2017-04-17 ENCOUNTER — Telehealth: Payer: Self-pay | Admitting: Family

## 2017-04-17 NOTE — Telephone Encounter (Signed)
Please mail  Amy Calhoun and [name],   I do not believe you have read my Mychart message so I wanted to mail the below to you:   I know you spoke with Baxter HireKristen, my nurse, regarding urine culture. I just wanted to be sure that you understood it was what I would call borderline results as it did grow bacterier however the pathology appears to be normal GU flora- bacteria that we normally see in the vagina.   If Amy Calhoun is not having symptoms at this time, I would advise to continue close monitoring. I believe this what you indicated to Pico RiveraKristen as well as your preference.   I Just wanted to make sure we were all on the same page.   If you notice any symptoms or you want a recheck, please dont hesitate at all - you may always leave a urine sample at our office.    My best,   Claris CheMargaret, NP   [address]

## 2017-04-17 NOTE — Telephone Encounter (Signed)
Letter mailed

## 2017-04-19 ENCOUNTER — Telehealth: Payer: Self-pay | Admitting: Family

## 2017-04-19 ENCOUNTER — Encounter: Payer: Self-pay | Admitting: Family

## 2017-04-19 MED ORDER — AMITRIPTYLINE HCL 75 MG PO TABS: 150.0000 mg | ORAL_TABLET | Freq: Every day | ORAL | 0 refills | Status: DC

## 2017-04-19 MED ORDER — AMITRIPTYLINE HCL 75 MG PO TABS: 150.0000 mg | ORAL_TABLET | Freq: Every day | ORAL | 1 refills | Status: DC

## 2017-04-19 NOTE — Telephone Encounter (Signed)
[facility]

## 2017-04-19 NOTE — Telephone Encounter (Signed)
Please advise 

## 2017-04-19 NOTE — Telephone Encounter (Signed)
Copied from CRM [phone]. Topic: Quick Communication - Rx Refill/Question >> Apr 19, 2017 11:02 AM Diana EvesHoyt, Maryann B wrote: Medication: Amitriptyline  Pt hoping to get this filled at a local pharmacy due to [facility] not being able to get this to the pt by next Thursday. Also Ms. Verdin would like Claris CheMargaret to disregard the [employer] Group[employer] message due to [facility] not being able to get the medication to them in time. She is also requesting refills sent in with the medication.   Has the patient contacted their pharmacy? Yes.   (Agent: If no, request that the patient contact the pharmacy for the refill.) Preferred Pharmacy (with phone number or street name): [id number] - [place], [place] - [address] Agent: Please be advised that RX refills may take up to 3 business days. We ask that you follow-up with your pharmacy.

## 2017-04-20 DIAGNOSIS — R69 Illness, unspecified: Secondary | ICD-10-CM | POA: Diagnosis not present

## 2017-04-22 NOTE — Telephone Encounter (Signed)
Seem my chart message dated 04/19/17

## 2017-05-02 NOTE — Telephone Encounter (Signed)
error 

## 2017-05-08 DIAGNOSIS — E113393 Type 2 diabetes mellitus with moderate nonproliferative diabetic retinopathy without macular edema, bilateral: Secondary | ICD-10-CM | POA: Diagnosis not present

## 2017-05-27 ENCOUNTER — Other Ambulatory Visit: Payer: Self-pay | Admitting: Gastroenterology

## 2017-05-27 DIAGNOSIS — R69 Illness, unspecified: Secondary | ICD-10-CM | POA: Diagnosis not present

## 2017-06-14 ENCOUNTER — Telehealth: Payer: Self-pay | Admitting: Sports Medicine

## 2017-06-14 ENCOUNTER — Other Ambulatory Visit: Payer: Self-pay | Admitting: Sports Medicine

## 2017-06-14 NOTE — Telephone Encounter (Unsigned)
Copied from CRM [id number]. Topic: Quick Communication - Rx Refill/Question >> Jun 14, 2017 11:25 AM Percival Spanish wrote: Medication gabapentin (NEURONTIN) 100 MG capsule    90 DAY SUPPLY   Has the patient contacted their pharmacy yes   ( Preferred Pharmacy  [facility]   Agent: Please be advised that RX refills may take up to 3 business days. We ask that you follow-up with your pharmacy.

## 2017-06-17 NOTE — Telephone Encounter (Signed)
Rx sent in 06/14/17 by provider

## 2017-06-26 DIAGNOSIS — R69 Illness, unspecified: Secondary | ICD-10-CM | POA: Diagnosis not present

## 2017-07-11 ENCOUNTER — Telehealth: Payer: Self-pay | Admitting: Family

## 2017-07-11 DIAGNOSIS — E1142 Type 2 diabetes mellitus with diabetic polyneuropathy: Secondary | ICD-10-CM

## 2017-07-11 MED ORDER — "INSULIN SYRINGE 30G X 5/16"" 1 ML MISC": 5 refills | Status: DC

## 2017-07-11 MED ORDER — "INSULIN SYRINGE 29G X 1/2"" 1 ML MISC": 5 refills | Status: DC

## 2017-07-11 NOTE — Telephone Encounter (Signed)
New script sent

## 2017-07-11 NOTE — Telephone Encounter (Signed)
Copied from CRM [phone]. Topic: Quick Communication - Rx Refill/Question >> Jul 11, 2017 10:47 AM Crist InfanteHarrald, Kathy J wrote: Medication: insulin syringes 1/2  cc [id number], [place] - [address] [phone] (Phone) [phone] (Fax)

## 2017-07-11 NOTE — Telephone Encounter (Signed)
Pharmacy does not have 29 gauge of insulin syringe, they do have 30 gauge. Call [facility] [phone]

## 2017-07-11 NOTE — Addendum Note (Signed)
Addended by: Alisia FerrariBARE, KRISTEN C on: 07/11/2017 04:43 PM   Modules accepted: Orders

## 2017-07-11 NOTE — Telephone Encounter (Signed)
Please advise 

## 2017-07-11 NOTE — Telephone Encounter (Signed)
Script sent  

## 2017-07-11 NOTE — Addendum Note (Signed)
Addended by: Alisia FerrariBARE, Deidrick Rainey C on: 07/11/2017 04:44 PM   Modules accepted: Orders

## 2017-07-12 ENCOUNTER — Other Ambulatory Visit: Payer: Self-pay | Admitting: Family

## 2017-07-12 DIAGNOSIS — E1142 Type 2 diabetes mellitus with diabetic polyneuropathy: Secondary | ICD-10-CM

## 2017-07-15 ENCOUNTER — Telehealth: Payer: Self-pay | Admitting: Pharmacist

## 2017-07-15 NOTE — Telephone Encounter (Signed)
Called in Rx for insulin syringes to lead tech, tiffany:   Insulin syringes 0.5 mL 31 gauge, 5 mm, qty #500 3refills  Provided patient with 10 1 mL insulin syringes in the meantime.

## 2017-07-15 NOTE — Telephone Encounter (Signed)
° °  Pt daughter said they need 1/2 ml syringe  The one sent to the pharmacy is not correct and they need these today

## 2017-07-18 ENCOUNTER — Other Ambulatory Visit: Payer: Self-pay | Admitting: Family Medicine

## 2017-07-18 DIAGNOSIS — E1142 Type 2 diabetes mellitus with diabetic polyneuropathy: Secondary | ICD-10-CM

## 2017-07-22 ENCOUNTER — Encounter: Payer: Self-pay | Admitting: Family

## 2017-07-22 ENCOUNTER — Telehealth: Payer: Self-pay | Admitting: Family

## 2017-07-22 DIAGNOSIS — R350 Frequency of micturition: Secondary | ICD-10-CM

## 2017-07-22 NOTE — Telephone Encounter (Signed)
Spoke with daughter  Has intermittent pain below abdomen. Took a tyelonol #3, had a BM today and felt better.  Endorse urinary urgency however has urinary incontinence.   No severe abdominal pain. Describes as dull achey.  NO fever, dysuia.  Drinking propel.  Daughter states been with mom all day and she looks well. She is comfortable waiting until Weds for OV.    Baxter HireKristen, will you call emma and schedule labs?

## 2017-07-22 NOTE — Telephone Encounter (Addendum)
Reason for call: lower abdominal pain  Symptoms wondering if her was mom was  constipated ,  However patient denies constipation, pain located down lower belly to hip, still urinating fine, denies fever, hx of UTI Duration 2-3 days  Medications: AZO  Last seen for this problem:N/A Seen by: Offered appointment at another office daughter declined, also offered for daughter to go to urgent care she states she has bad experience there.  I apologized .   Daughter did get a little hostile. She wanted to know why patient couldn't just come in for lab. I explained to daughter that patient would need to be evaluated for symptoms first and make sure no other test needed to be ordered. No appointments available in our office today.  Claris CheMargaret has opening on Wednesday . Patient is scheduled to see her then. Daughter is ok waiting until then.   Advised if new symptoms develop to seek treatment immediately or call the office, she verbalized understanding.

## 2017-07-22 NOTE — Telephone Encounter (Signed)
Call pt She needs acute visit ASAP Please triage  We can do f/u appt later if no available but she needs acute with either me or another provider

## 2017-07-23 ENCOUNTER — Other Ambulatory Visit (INDEPENDENT_AMBULATORY_CARE_PROVIDER_SITE_OTHER): Payer: Medicare HMO

## 2017-07-23 DIAGNOSIS — R350 Frequency of micturition: Secondary | ICD-10-CM

## 2017-07-23 NOTE — Telephone Encounter (Signed)
Patient was scheduled lab appointment on 07/22/17 for 07/23/17 spoke with [name] daughter

## 2017-07-24 ENCOUNTER — Telehealth: Payer: Self-pay | Admitting: Family

## 2017-07-24 ENCOUNTER — Ambulatory Visit: Payer: Medicare HMO | Admitting: Family

## 2017-07-24 ENCOUNTER — Ambulatory Visit
Admission: RE | Admit: 2017-07-24 | Discharge: 2017-07-24 | Disposition: A | Discharge status: Home / Self Care | Payer: Medicare HMO | Source: Ambulatory Visit | Attending: Family | Admitting: Family

## 2017-07-24 ENCOUNTER — Encounter: Payer: Self-pay | Admitting: Family

## 2017-07-24 VITALS — BP 142/66 | HR 71 | Temp 97.9°F | Wt 155.5 lb

## 2017-07-24 DIAGNOSIS — R1030 Lower abdominal pain, unspecified: Secondary | ICD-10-CM | POA: Diagnosis not present

## 2017-07-24 DIAGNOSIS — Z8719 Personal history of other diseases of the digestive system: Secondary | ICD-10-CM

## 2017-07-24 DIAGNOSIS — K224 Dyskinesia of esophagus: Secondary | ICD-10-CM

## 2017-07-24 DIAGNOSIS — R935 Abnormal findings on diagnostic imaging of other abdominal regions, including retroperitoneum: Secondary | ICD-10-CM

## 2017-07-24 LAB — COMPREHENSIVE METABOLIC PANEL
ALK PHOS: 71 U/L (ref 39–117)
ALT: 13 U/L (ref 0–35)
AST: 16 U/L (ref 0–37)
Albumin: 4 g/dL (ref 3.5–5.2)
BUN: 18 mg/dL (ref 6–23)
CO2: 28 mEq/L (ref 19–32)
CREATININE: 1.3 mg/dL — AB (ref 0.40–1.20)
Calcium: 8.5 mg/dL (ref 8.4–10.5)
Chloride: 102 mEq/L (ref 96–112)
GFR: 41.2 mL/min — ABNORMAL LOW (ref >60.00)
Glucose, Bld: 244 mg/dL — ABNORMAL HIGH (ref 70–99)
POTASSIUM: 4.6 meq/L (ref 3.5–5.1)
SODIUM: 138 meq/L (ref 135–145)
TOTAL PROTEIN: 6.8 g/dL (ref 6.0–8.3)
Total Bilirubin: 0.3 mg/dL (ref 0.2–1.2)

## 2017-07-24 LAB — CBC WITH DIFFERENTIAL/PLATELET
BASOS PCT: 0.8 % (ref 0.0–3.0)
Basophils Absolute: 0.1 10*3/uL (ref 0.0–0.1)
EOS PCT: 1.7 % (ref 0.0–5.0)
Eosinophils Absolute: 0.1 10*3/uL (ref 0.0–0.7)
HCT: 34.9 % — ABNORMAL LOW (ref 36.0–46.0)
Hemoglobin: 11.6 g/dL — ABNORMAL LOW (ref 12.0–15.0)
LYMPHS ABS: 2.5 10*3/uL (ref 0.7–4.0)
Lymphocytes Relative: 37.4 % (ref 12.0–46.0)
MCHC: 33.3 g/dL (ref 30.0–36.0)
MCV: 88 fl (ref 78.0–100.0)
MONO ABS: 0.6 10*3/uL (ref 0.1–1.0)
Monocytes Relative: 9.1 % (ref 3.0–12.0)
NEUTROS PCT: 51 % (ref 43.0–77.0)
Neutro Abs: 3.4 10*3/uL (ref 1.4–7.7)
Platelets: 207 10*3/uL (ref 150.0–400.0)
RBC: 3.97 Mil/uL (ref 3.87–5.11)
RDW: 14.4 % (ref 11.5–15.5)
WBC: 6.7 10*3/uL (ref 4.0–10.5)

## 2017-07-24 LAB — URINALYSIS, ROUTINE W REFLEX MICROSCOPIC
Bilirubin Urine: NEGATIVE
Hgb urine dipstick: NEGATIVE
Ketones, ur: NEGATIVE
Leukocytes, UA: NEGATIVE
Nitrite: NEGATIVE
RBC / HPF: NONE SEEN (ref 0–?)
Specific Gravity, Urine: 1.01 (ref 1.000–1.030)
TOTAL PROTEIN, URINE-UPE24: NEGATIVE
URINE GLUCOSE: NEGATIVE
Urobilinogen, UA: 0.2 (ref 0.0–1.0)
pH: 6 (ref 5.0–8.0)

## 2017-07-24 NOTE — Telephone Encounter (Signed)
Spoke with daughter [name] regarding findings on CT abdomen pelvis.  I explained that it does not appear acute infectious diverticulitis.  I explained the incidental finding including a soft tissue mass in the mesentery of the right upper quadrant.  Also explained pneumatosis of the cecum, which could indicate a perforation.    Discussed with daughter at length that patient today felt well, she is not toxic in appearance, her vital signs were normal.  Her pain was mild and patient described as sore.  All of these things I think are reassuring. daughter and I felt comfortable with waiting for general surgery consult.  Also messaged to her prior lumbar gastrology PA regarding findings, dysphasia.  Spoke at great length with daughter about paying very close attention to how her mother is feeling, any signs of abdominal pain, new features, daughter let me know ASAP.

## 2017-07-24 NOTE — Telephone Encounter (Signed)
Call Amy Calhoun ( dgrt) if on DPR. Otherwise speak with patient  CT ab does not show diverticulitis however it does show a soft tissue mass in Left upper quadrant mesentary and inflammation in the cecum. We may need to do an abdominal US to get a better view of soft tissue mass. I will get back to her with this.   I am working with [name] to get her back in with [facility]. I have messaged [name] ,the PA she last year prior to Dr Christella HartiganJacobs performing dilation of esophagus. Want to await their opinion regarding inflammation near the cecum   Please advise to stay very very vigilant and any new or worsening signs- belly pain returning, systemic features, N, V, fever- she needs to let us know asap.

## 2017-07-24 NOTE — Progress Notes (Signed)
Subjective:    Patient ID: Amy Calhoun, female    DOB: [birth year], 84 y.o.   MRN: [id number]  CC: Amy Calhoun is a 84 y.o. female who presents today for an acute visit.    HPI: CC: lower abdominal pain x 3 days, improved yesterday after bowel movement.  Feels 'sore' today. Not severe. Pain was unchanged with movement. Eating okay however still complains of trouble swallowing ( chronic) especially meats.  No vomiting, fever, flank pain, diarrhea, episgastric pain, dyspepsia.  No dysuria. Urinary urgency however this is chronic. No epigastric burning.   H/o hysterectomy, cholestectomy  Drinks three 12 ounce bottles of water per day.   Accompanied by family friend as daughter Amy Calhoun is home sick today.  Colonoscopy 2010- colitis , diverticula.  Per patient, had diarrhea at that time.   HISTORY:  Past Medical History:  Diagnosis Date  . Chronic kidney disease 08/07/2016   Chronic renal failure, stage III  . Depression   . Esophageal dysphagia   . Gastric ulcer   . GERD (gastroesophageal reflux disease)   . Hyperlipidemia   . Hypertension   . Hypothyroidism   . IDDM (insulin dependent diabetes mellitus) (HCC)   . Ischemic colitis (HCC)   . Osteoarthritis   . Ulcer of esophagus    Past Surgical History:  Procedure Laterality Date  . ABDOMINAL HYSTERECTOMY    . ESOPHAGEAL DILATION  08/07/2016   usually a couple times a year, last time 07/30/16  . GALLBLADDER SURGERY    . LUMBAR LAMINECTOMY/DECOMPRESSION MICRODISCECTOMY N/A 08/10/2016   Procedure: BILATERAL HEMILAMINECTOMY LUMBAR THREE-FOUR,LUMBAR FOUR-FIVE,LEFT LUMBAR FIVE-SACRAL ONE HEMILAMINECTOMY AND DECOMPRESSION;  Surgeon: Coletta Memos, MD;  Location: [facility] OR;  Service: Neurosurgery;  Laterality: N/A;  . THYROIDECTOMY     Family History  Problem Relation Age of Onset  . Diabetes Mother   . Arthritis Mother   . Hyperlipidemia Mother   . Mental illness Mother   . Heart disease Father   . Mental illness Sister    . Arthritis Maternal Grandmother   . Arthritis Maternal Grandfather   . Colon cancer Neg Hx   . Stomach cancer Neg Hx   . Esophageal cancer Neg Hx     Allergies: Pravastatin Current Outpatient Medications on File Prior to Visit  Medication Sig Dispense Refill  . acetaminophen-codeine (TYLENOL #3) 300-30 MG tablet Take 1 tablet by mouth every 6 (six) hours as needed for moderate pain. 30 tablet 0  . acidophilus (RISAQUAD) CAPS capsule Take 1 capsule by mouth daily.    Marland Kitchen albuterol (PROVENTIL HFA;VENTOLIN HFA) 108 (90 Base) MCG/ACT inhaler Inhale 2 puffs into the lungs every 6 (six) hours as needed for wheezing or shortness of breath. 1 Inhaler 2  . albuterol (PROVENTIL) (2.5 MG/3ML) 0.083% nebulizer solution Take 3 mLs (2.5 mg total) by nebulization every 6 (six) hours as needed for wheezing or shortness of breath. 75 mL 12  . amitriptyline (ELAVIL) 75 MG tablet Take 2 tablets (150 mg total) by mouth at bedtime. 180 tablet 1  . BD INSULIN SYRINGE U/F 31G X 5/16" 1 ML MISC USE AS DIRECTED TO ADMINISTER INSULIN 100 each 5  . Calcium Citrate-Vitamin D (CALCIUM + D PO) Take 1 tablet by mouth daily.    . Cholecalciferol (VITAMIN D) 2000 units tablet Take by mouth.    . Coenzyme Q10 (CO Q 10) 100 MG CAPS Take 1 capsule by mouth daily.    . Continuous Blood Gluc Receiver (FREESTYLE LIBRE READER) DEVI 1  Device by Does not apply route once. 1 Device 0  . Continuous Blood Gluc Sensor (FREESTYLE LIBRE SENSOR SYSTEM) MISC Please dispense 3 sensors monthly 3 each 11  . CRANBERRY PO Take 1 tablet by mouth every Monday, Wednesday, and Friday.    . docusate sodium (COLACE) 100 MG capsule Take 1 capsule (100 mg total) by mouth 2 (two) times daily. 10 capsule 0  . gabapentin (NEURONTIN) 100 MG capsule TAKE 2 CAPSULES THREE TIMES A DAY 540 capsule 0  . glucagon (GLUCAGON EMERGENCY) 1 MG injection Inject 1 mg into the vein once as needed. For hypoglycemic episodes. E11.42 1 each 3  . glucose blood test strip  Use to test blood sugar up to 5 times daily. 200 each 12  . ibuprofen (ADVIL,MOTRIN) 600 MG tablet Take 1 tablet (600 mg total) by mouth 2 (two) times daily as needed. 180 tablet 0  . insulin NPH Human (HUMULIN N) 100 UNIT/ML injection INJECT 20 UNITS EVERY MORNING AND 10 UNITS AT NIGHT 30 mL 1  . insulin regular (HUMULIN R) 100 units/mL injection INJECT 3 UNITS BEFORE BREAKFAST, 6 UNITS BEFORE LUNCH AND 6 UNITS BEFORE DINNER 30 mL 0  . levothyroxine (SYNTHROID, LEVOTHROID) 150 MCG tablet Take 1 tablet (150 mcg total) by mouth daily. 90 tablet 0  . lidocaine (LIDODERM) 5 % PLACE 1 PATCH ON THE SKIN DAILY 90 patch 1  . lisinopril (PRINIVIL,ZESTRIL) 10 MG tablet Take 1 tablet (10 mg total) by mouth daily. 90 tablet 1  . omeprazole (PRILOSEC) 40 MG capsule Take 1 capsule (40 mg total) by mouth daily. 90 capsule 3  . omeprazole (PRILOSEC) 40 MG capsule TAKE 1 CAPSULE DAILY 90 capsule 3  . pyridOXINE (VITAMIN B-6) 100 MG tablet Take 100 mg by mouth daily.    . riboflavin (VITAMIN B-2) 100 MG TABS tablet Take 100 mg by mouth daily.    Marland Kitchen senna-docusate (SENOKOT-S) 8.6-50 MG tablet Take 1 tablet by mouth at bedtime. 30 tablet 5  . simvastatin (ZOCOR) 40 MG tablet Take 40 mg by mouth at bedtime.     . TRADJENTA 5 MG TABS tablet TAKE 1 TABLET DAILY 90 tablet 1  . Turmeric 500 MG TABS Take 750 mg by mouth 2 (two) times daily.     Current Facility-Administered Medications on File Prior to Visit  Medication Dose Route Frequency Provider Last Rate Last Dose  . 0.9 %  sodium chloride infusion  500 mL Intravenous Continuous Rachael Fee, MD        Social History   Tobacco Use  . Smoking status: Never Smoker  . Smokeless tobacco: Never Used  Substance Use Topics  . Alcohol use: No  . Drug use: No    Review of Systems  Constitutional: Negative for chills and fever.  Respiratory: Negative for cough.   Cardiovascular: Negative for chest pain and palpitations.  Gastrointestinal: Positive for  abdominal pain. Negative for abdominal distention, constipation, nausea and vomiting.  Genitourinary: Positive for frequency and urgency. Negative for difficulty urinating and dysuria.      Objective:    BP (!) 142/66 (BP Location: Left Arm, Patient Position: Sitting, Cuff Size: Normal)   Pulse 71   Temp 97.9 F (36.6 C) (Oral)   Wt 155 lb 8 oz (70.5 kg)   SpO2 96%   BMI 27.55 kg/m    Physical Exam  Constitutional: She appears well-developed and well-nourished.  Eyes: Conjunctivae are normal.  Cardiovascular: Normal rate, regular rhythm, normal heart sounds and normal pulses.  Pulmonary/Chest: Effort normal and breath sounds normal. She has no wheezes. She has no rhonchi. She has no rales.  Abdominal: Soft. Normal appearance and bowel sounds are normal. She exhibits no distension, no fluid wave, no ascites and no mass. There is tenderness in the right lower quadrant and left lower quadrant. There is no rigidity, no rebound, no guarding and no CVA tenderness.  Diffuse mild tenderness with palpation lower abdomen. No exquisite tenderness over RLQ ( Mcburney's point).   Neurological: She is alert.  Skin: Skin is warm and dry.  Psychiatric: She has a normal mood and affect. Her speech is normal and behavior is normal. Thought content normal.  Vitals reviewed.      Assessment & Plan:   1. Esophageal dysmotility Chronic. Referral back to GI for potential dilatation. Will follow.  - Ambulatory referral to Gastroenterology  2. Lower abdominal pain Patient well appearing in office. She is not septic in appearance. Her WBC is normal. Slight decrease in renal function and advised to closely monitor. Plenty of fluids.  Based on focal nature of pain and known h/o diverticula, agreed to pursue CT Abdomen and pelvis. Pending at this time.  - CT Abdomen Pelvis Wo Contrast    I am having Cherysh M. Balistreri maintain her simvastatin, omeprazole, pyridOXINE, riboflavin, glucagon, docusate  sodium, senna-docusate, lidocaine, acidophilus, CRANBERRY PO, albuterol, Co Q 10, Calcium Citrate-Vitamin D (CALCIUM + D PO), acetaminophen-codeine, FREESTYLE LIBRE READER, FREESTYLE LIBRE SENSOR SYSTEM, albuterol, TRADJENTA, insulin NPH Human, glucose blood, Vitamin D, ibuprofen, levothyroxine, Turmeric, lisinopril, amitriptyline, omeprazole, gabapentin, BD INSULIN SYRINGE U/F, and insulin regular. We will continue to administer sodium chloride.   No orders of the defined types were placed in this encounter.   Return precautions given.   Risks, benefits, and alternatives of the medications and treatment plan prescribed today were discussed, and patient expressed understanding.   Education regarding symptom management and diagnosis given to patient on AVS.  Continue to follow with Allegra GranaArnett, Terrian Sentell G, FNP for routine health maintenance.   Andre Mae WalgreenMason and I agreed with plan.   Rennie PlowmanMargaret Katti Pelle, FNP

## 2017-07-24 NOTE — Telephone Encounter (Signed)
Amy Calhoun,   Patient should still be est with [facility]- how soon would she be able to be seen? There are abnormal findings on CT ab.  Can she get in with the PA [name] asap? Or she has seen Dr Rob Buntinganiel Jacobs there as well in 2018.  Let me know!

## 2017-07-24 NOTE — Telephone Encounter (Signed)
Spoke with Kara MeadEmma and advised of below and then DowlingMargaret spoke with her

## 2017-07-24 NOTE — Patient Instructions (Signed)
CT Abdomen and pelvis  Concern for diverticulitis.   More water; we will need to monitor kidney function.   Awaiting on urine culture.

## 2017-07-25 LAB — CULTURE, URINE COMPREHENSIVE
MICRO NUMBER: 90811352
RESULT: NO GROWTH
SPECIMEN QUALITY: ADEQUATE

## 2017-07-26 DIAGNOSIS — R69 Illness, unspecified: Secondary | ICD-10-CM | POA: Diagnosis not present

## 2017-07-26 NOTE — Telephone Encounter (Signed)
Discussed with Amy Calhoun  We will consult surgery urgently, NOT GI

## 2017-07-29 ENCOUNTER — Ambulatory Visit: Payer: Medicare HMO | Admitting: General Surgery

## 2017-07-29 ENCOUNTER — Encounter: Payer: Self-pay | Admitting: General Surgery

## 2017-07-29 VITALS — BP 130/70 | HR 68 | Resp 14 | Ht 66.0 in | Wt 155.0 lb

## 2017-07-29 DIAGNOSIS — K639 Disease of intestine, unspecified: Secondary | ICD-10-CM

## 2017-07-29 DIAGNOSIS — K6389 Other specified diseases of intestine: Secondary | ICD-10-CM | POA: Diagnosis not present

## 2017-07-29 NOTE — Patient Instructions (Signed)
Return as needed.The patient is aware to call back for any questions or concerns.  

## 2017-07-29 NOTE — Progress Notes (Signed)
Patient ID: Amy Calhoun, female   DOB: [birth year], 84 y.o.   MRN: [id number]  Chief Complaint  Patient presents with  . Other    HPI Amy Calhoun is a 84 y.o. female here today for a evalaution for a abnormal ct scan done on 07/24/2017. Patient had some pain in her lower stomach about two weeks ago which had resolved by the time she saw her PCP last week.  She is presently asymptomatic.  Marland Kitchen No nauseas or vomiting. Moves her bowels daily. Takes a stool softer daily.   Daughter Wonda Olds is present at visit.  Her granddaughter was also present.  The daughter with whom she is in closest contact is presently hospitalized and was not available for discussion.  HPI  Past Medical History:  Diagnosis Date  . Chronic kidney disease 08/07/2016   Chronic renal failure, stage III  . Depression   . Esophageal dysphagia   . Gastric ulcer   . GERD (gastroesophageal reflux disease)   . Hyperlipidemia   . Hypertension   . Hypothyroidism   . IDDM (insulin dependent diabetes mellitus) (HCC)   . Ischemic colitis (HCC)   . Osteoarthritis   . Ulcer of esophagus     Past Surgical History:  Procedure Laterality Date  . ABDOMINAL HYSTERECTOMY    . COLONOSCOPY  2010  . ESOPHAGEAL DILATION  08/07/2016   usually a couple times a year, last time 07/30/16  . GALLBLADDER SURGERY    . LUMBAR LAMINECTOMY/DECOMPRESSION MICRODISCECTOMY N/A 08/10/2016   Procedure: BILATERAL HEMILAMINECTOMY LUMBAR THREE-FOUR,LUMBAR FOUR-FIVE,LEFT LUMBAR FIVE-SACRAL ONE HEMILAMINECTOMY AND DECOMPRESSION;  Surgeon: Coletta Memos, MD;  Location: [facility] OR;  Service: Neurosurgery;  Laterality: N/A;  . THYROIDECTOMY      Family History  Problem Relation Age of Onset  . Diabetes Mother   . Arthritis Mother   . Hyperlipidemia Mother   . Mental illness Mother   . Heart disease Father   . Mental illness Sister   . Arthritis Maternal Grandmother   . Arthritis Maternal Grandfather   . Colon cancer Neg Hx   . Stomach  cancer Neg Hx   . Esophageal cancer Neg Hx     Social History Social History   Tobacco Use  . Smoking status: Never Smoker  . Smokeless tobacco: Never Used  Substance Use Topics  . Alcohol use: No  . Drug use: No    Allergies  Allergen Reactions  . Pravastatin Other (See Comments)    States she refused due to side effects    Current Outpatient Medications  Medication Sig Dispense Refill  . acetaminophen-codeine (TYLENOL #3) 300-30 MG tablet Take 1 tablet by mouth every 6 (six) hours as needed for moderate pain. 30 tablet 0  . acidophilus (RISAQUAD) CAPS capsule Take 1 capsule by mouth daily.    Marland Kitchen albuterol (PROVENTIL HFA;VENTOLIN HFA) 108 (90 Base) MCG/ACT inhaler Inhale 2 puffs into the lungs every 6 (six) hours as needed for wheezing or shortness of breath. 1 Inhaler 2  . albuterol (PROVENTIL) (2.5 MG/3ML) 0.083% nebulizer solution Take 3 mLs (2.5 mg total) by nebulization every 6 (six) hours as needed for wheezing or shortness of breath. 75 mL 12  . amitriptyline (ELAVIL) 75 MG tablet Take 2 tablets (150 mg total) by mouth at bedtime. 180 tablet 1  . BD INSULIN SYRINGE U/F 31G X 5/16" 1 ML MISC USE AS DIRECTED TO ADMINISTER INSULIN 100 each 5  . Calcium Citrate-Vitamin D (CALCIUM + D PO) Take 1 tablet by  mouth daily.    . Cholecalciferol (VITAMIN D) 2000 units tablet Take by mouth.    . Coenzyme Q10 (CO Q 10) 100 MG CAPS Take 1 capsule by mouth daily.    . Continuous Blood Gluc Sensor (FREESTYLE LIBRE SENSOR SYSTEM) MISC Please dispense 3 sensors monthly 3 each 11  . CRANBERRY PO Take 1 tablet by mouth every Monday, Wednesday, and Friday.    . docusate sodium (COLACE) 100 MG capsule Take 1 capsule (100 mg total) by mouth 2 (two) times daily. 10 capsule 0  . gabapentin (NEURONTIN) 100 MG capsule TAKE 2 CAPSULES THREE TIMES A DAY 540 capsule 0  . glucagon (GLUCAGON EMERGENCY) 1 MG injection Inject 1 mg into the vein once as needed. For hypoglycemic episodes. E11.42 1 each 3  .  glucose blood test strip Use to test blood sugar up to 5 times daily. 200 each 12  . ibuprofen (ADVIL,MOTRIN) 600 MG tablet Take 1 tablet (600 mg total) by mouth 2 (two) times daily as needed. 180 tablet 0  . insulin NPH Human (HUMULIN N) 100 UNIT/ML injection INJECT 20 UNITS EVERY MORNING AND 10 UNITS AT NIGHT 30 mL 1  . insulin regular (HUMULIN R) 100 units/mL injection INJECT 3 UNITS BEFORE BREAKFAST, 6 UNITS BEFORE LUNCH AND 6 UNITS BEFORE DINNER 30 mL 0  . levothyroxine (SYNTHROID, LEVOTHROID) 150 MCG tablet Take 1 tablet (150 mcg total) by mouth daily. 90 tablet 0  . lidocaine (LIDODERM) 5 % PLACE 1 PATCH ON THE SKIN DAILY 90 patch 1  . lisinopril (PRINIVIL,ZESTRIL) 10 MG tablet Take 1 tablet (10 mg total) by mouth daily. 90 tablet 1  . omeprazole (PRILOSEC) 40 MG capsule TAKE 1 CAPSULE DAILY 90 capsule 3  . pyridOXINE (VITAMIN B-6) 100 MG tablet Take 100 mg by mouth daily.    . riboflavin (VITAMIN B-2) 100 MG TABS tablet Take 100 mg by mouth daily.    Marland Kitchen. senna-docusate (SENOKOT-S) 8.6-50 MG tablet Take 1 tablet by mouth at bedtime. 30 tablet 5  . simvastatin (ZOCOR) 40 MG tablet Take 40 mg by mouth at bedtime.     . TRADJENTA 5 MG TABS tablet TAKE 1 TABLET DAILY 90 tablet 1  . Turmeric 500 MG TABS Take 750 mg by mouth 2 (two) times daily.    . Continuous Blood Gluc Receiver (FREESTYLE LIBRE READER) DEVI 1 Device by Does not apply route once. 1 Device 0   Current Facility-Administered Medications  Medication Dose Route Frequency Provider Last Rate Last Dose  . 0.9 %  sodium chloride infusion  500 mL Intravenous Continuous Rachael FeeJacobs, Daniel P, MD        Review of Systems Review of Systems  Constitutional: Negative.   Respiratory: Negative.   Cardiovascular: Negative.     Blood pressure 130/70, pulse 68, resp. rate 14, height 5\' 6"  (1.676 m), weight 155 lb (70.3 kg).  Physical Exam Physical Exam  Constitutional: She is oriented to person, place, and time. She appears well-developed  and well-nourished.  Eyes: Conjunctivae are normal. No scleral icterus.  Neck: Neck supple.  Cardiovascular: Regular rhythm and normal heart sounds.  Pulmonary/Chest: Effort normal and breath sounds normal.  Abdominal: Soft. Normal appearance.    Lymphadenopathy:    She has no cervical adenopathy.  Neurological: She is alert and oriented to person, place, and time.  Skin: Skin is warm and dry.    Data Reviewed  CT of the abdomen and pelvis dated July 24, 2017 showed pneumatosis as well as a  mesenteric mass.  CT of the abdomen and pelvis (without oral contrast) dated March 28, 2016 showed the mesenteric mass in a different location of the abdomen and slightly larger than presently noted.  No pneumatosis.  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  Idiopathic pneumatosis of the cecum, presently asymptomatic.    Plan  Patient has no clear etiology for either the mass or the pneumatosis.  She is presently symptom-free, eating without difficulty, and experiencing no unexplained weight loss, fever or chills.  Observation is warranted.  If earlier imaging studies are obtained, these would be compared to present studies.  In light of the modest decrease in size over the past 16 months of the mesenteric mass, likely the same area caught in different locations based on bowel motility), observation alone is warranted in this otherwise asymptomatic patient. Return as needed. The patient is aware to call back for any questions or concerns.   HPI, Physical Exam, Assessment and Plan have been scribed under the direction and in the presence of Donnalee Curry, MD.  Ples Specter, CMA  I have completed the exam and reviewed the above documentation for accuracy and completeness.  I agree with the above.  Museum/gallery conservator has been used and any errors in dictation or transcription are unintentional.  Donnalee Curry, M.D.,  F.A.C.S.  Merrily Pew Axl Rodino 07/29/2017, 7:45 PM

## 2017-07-30 ENCOUNTER — Other Ambulatory Visit: Payer: Self-pay

## 2017-07-30 NOTE — Telephone Encounter (Signed)
error 

## 2017-08-14 ENCOUNTER — Encounter

## 2017-08-14 ENCOUNTER — Encounter: Payer: Self-pay | Admitting: Physician Assistant

## 2017-08-14 ENCOUNTER — Ambulatory Visit: Payer: Medicare HMO | Admitting: Physician Assistant

## 2017-08-14 VITALS — BP 140/54 | HR 80 | Ht 62.75 in | Wt 154.5 lb

## 2017-08-14 DIAGNOSIS — R1314 Dysphagia, pharyngoesophageal phase: Secondary | ICD-10-CM

## 2017-08-14 DIAGNOSIS — K222 Esophageal obstruction: Secondary | ICD-10-CM

## 2017-08-14 NOTE — Progress Notes (Signed)
Chief Complaint: Dysphagia  HPI:    Amy Calhoun is an 84 year old Caucasian female with a past medical history of depression, dysphagia, hyperlipidemia, hypertension, ischemic colitis and others noted below, assigned to Dr. Christella HartiganJacobs, who returns to clinic today for continued complaint of dysphagia.    07/26/2016 office visit with me.  At that time it was discussed that October 2017 she had an esophagram which showed dysmotility and mention of a smooth narrowing of the distal part of the esophagus.  In February 2019 she was called and had no further issues of dysphasia and so intervention was not scheduled.  At time of that visit she explained that her "swallowing goes in spurts".  Had weeks at a time where she would have trouble and then would not for a long time.  At that time though everything had "gotten worse" and they were ready for something to be done.  At that time patient scheduled for EGD with dilation in the [facility] with Dr. Christella HartiganJacobs.  She was continued on omeprazole 40 mg once daily.  It was discussed patient may need evaluation with speech path in the future due to her known history of dysmotility.    07/30/2016 EGD with Dr. Christella HartiganJacobs which showed a mild benign-appearing stenosis and this was dilated to 20 mm.  Also small hiatal hernia.   Today, presents to clinic accompanied by her daughters and granddaughter who do assist with history, explains that after last dilation she did very well and had much improvement with swallowing and no difficulties in fact until the past 2 months when it has slowly started to creep back.  They are requesting repeat EGD since she did so well last time.  Continues on Omeprazole 40 mg daily.    Did have a back surgery since time of last visit, but no other acute health events.    Denies fever, chills, weight loss, heartburn or reflux.  Past Medical History:  Diagnosis Date  . Chronic kidney disease 08/07/2016   Chronic renal failure, stage III  . Depression   .  Esophageal dysphagia   . Gastric ulcer   . GERD (gastroesophageal reflux disease)   . Hyperlipidemia   . Hypertension   . Hypothyroidism   . IDDM (insulin dependent diabetes mellitus) (HCC)   . Ischemic colitis (HCC)   . Osteoarthritis   . Ulcer of esophagus     Past Surgical History:  Procedure Laterality Date  . ABDOMINAL HYSTERECTOMY    . COLONOSCOPY  2010  . ESOPHAGEAL DILATION  08/07/2016   usually a couple times a year, last time 07/30/16  . GALLBLADDER SURGERY    . LUMBAR LAMINECTOMY/DECOMPRESSION MICRODISCECTOMY N/A 08/10/2016   Procedure: BILATERAL HEMILAMINECTOMY LUMBAR THREE-FOUR,LUMBAR FOUR-FIVE,LEFT LUMBAR FIVE-SACRAL ONE HEMILAMINECTOMY AND DECOMPRESSION;  Surgeon: Coletta Memosabbell, Kyle, MD;  Location: [facility] OR;  Service: Neurosurgery;  Laterality: N/A;  . THYROIDECTOMY      Current Outpatient Medications  Medication Sig Dispense Refill  . acetaminophen-codeine (TYLENOL #3) 300-30 MG tablet Take 1 tablet by mouth every 6 (six) hours as needed for moderate pain. 30 tablet 0  . acidophilus (RISAQUAD) CAPS capsule Take 1 capsule by mouth daily.    Marland Kitchen. albuterol (PROVENTIL HFA;VENTOLIN HFA) 108 (90 Base) MCG/ACT inhaler Inhale 2 puffs into the lungs every 6 (six) hours as needed for wheezing or shortness of breath. 1 Inhaler 2  . amitriptyline (ELAVIL) 75 MG tablet Take 2 tablets (150 mg total) by mouth at bedtime. 180 tablet 1  . BD INSULIN SYRINGE U/F  31G X 5/16" 1 ML MISC USE AS DIRECTED TO ADMINISTER INSULIN 100 each 5  . Calcium Citrate-Vitamin D (CALCIUM + D PO) Take 1 tablet by mouth daily.    . Cholecalciferol (VITAMIN D) 2000 units tablet Take by mouth.    . Coenzyme Q10 (CO Q 10) 100 MG CAPS Take 1 capsule by mouth daily.    . Continuous Blood Gluc Sensor (FREESTYLE LIBRE SENSOR SYSTEM) MISC Please dispense 3 sensors monthly 3 each 11  . CRANBERRY PO Take 1 tablet by mouth every Monday, Wednesday, and Friday.    . docusate sodium (COLACE) 100 MG capsule Take 1 capsule (100 mg  total) by mouth 2 (two) times daily. (Patient taking differently: Take 100 mg by mouth every other day. ) 10 capsule 0  . gabapentin (NEURONTIN) 100 MG capsule TAKE 2 CAPSULES THREE TIMES A DAY 540 capsule 0  . glucagon (GLUCAGON EMERGENCY) 1 MG injection Inject 1 mg into the vein once as needed. For hypoglycemic episodes. E11.42 1 each 3  . glucose blood test strip Use to test blood sugar up to 5 times daily. 200 each 12  . ibuprofen (ADVIL,MOTRIN) 600 MG tablet Take 1 tablet (600 mg total) by mouth 2 (two) times daily as needed. 180 tablet 0  . insulin NPH Human (HUMULIN N) 100 UNIT/ML injection INJECT 20 UNITS EVERY MORNING AND 10 UNITS AT NIGHT 30 mL 1  . insulin regular (HUMULIN R) 100 units/mL injection INJECT 3 UNITS BEFORE BREAKFAST, 6 UNITS BEFORE LUNCH AND 6 UNITS BEFORE DINNER 30 mL 0  . levothyroxine (SYNTHROID, LEVOTHROID) 150 MCG tablet Take 1 tablet (150 mcg total) by mouth daily. 90 tablet 0  . lidocaine (LIDODERM) 5 % PLACE 1 PATCH ON THE SKIN DAILY 90 patch 1  . lisinopril (PRINIVIL,ZESTRIL) 10 MG tablet Take 1 tablet (10 mg total) by mouth daily. 90 tablet 1  . omeprazole (PRILOSEC) 40 MG capsule TAKE 1 CAPSULE DAILY 90 capsule 3  . pyridOXINE (VITAMIN B-6) 100 MG tablet Take 100 mg by mouth daily.    . riboflavin (VITAMIN B-2) 100 MG TABS tablet Take 100 mg by mouth daily.    Marland Kitchen senna-docusate (SENOKOT-S) 8.6-50 MG tablet Take 1 tablet by mouth at bedtime. (Patient taking differently: Take 1 tablet by mouth as needed. ) 30 tablet 5  . TRADJENTA 5 MG TABS tablet TAKE 1 TABLET DAILY 90 tablet 1  . Turmeric 500 MG TABS Take 750 mg by mouth 2 (two) times daily.    . Continuous Blood Gluc Receiver (FREESTYLE LIBRE READER) DEVI 1 Device by Does not apply route once. 1 Device 0   No current facility-administered medications for this visit.     Allergies as of 08/14/2017 - Review Complete 08/14/2017  Allergen Reaction Noted  . Pravachol [pravastatin] Other (See Comments) 12/21/2015      Family History  Problem Relation Age of Onset  . Diabetes Mother   . Arthritis Mother   . Hyperlipidemia Mother   . Mental illness Mother   . Heart disease Father   . Mental illness Sister   . Arthritis Maternal Grandmother   . Arthritis Maternal Grandfather   . Colon cancer Neg Hx   . Stomach cancer Neg Hx   . Esophageal cancer Neg Hx     Social History   Socioeconomic History  . Marital status: Widowed    Spouse name: Not on file  . Number of children: Not on file  . Years of education: Not on file  .  Highest education level: Not on file  Occupational History  . Occupation: retired  Engineer, production  . Financial resource strain: Not hard at all  . Food insecurity:    Worry: Never true    Inability: Never true  . Transportation needs:    Medical: No    Non-medical: No  Tobacco Use  . Smoking status: Never Smoker  . Smokeless tobacco: Never Used  Substance and Sexual Activity  . Alcohol use: No  . Drug use: No  . Sexual activity: Never    Partners: Male  Lifestyle  . Physical activity:    Days per week: 3 days    Minutes per session: 50 min  . Stress: Not on file  Relationships  . Social connections:    Talks on phone: Not on file    Gets together: Not on file    Attends religious service: Not on file    Active member of club or organization: Not on file    Attends meetings of clubs or organizations: Not on file    Relationship status: Not on file  . Intimate partner violence:    Fear of current or ex partner: No    Emotionally abused: No    Physically abused: No    Forced sexual activity: No  Other Topics Concern  . Not on file  Social History Narrative  . Not on file    Review of Systems:    Constitutional: No weight loss, fever or chills Skin: No rash Cardiovascular: No chest pain Respiratory: No SOB  Gastrointestinal: See HPI and otherwise negative Genitourinary: No dysuria  Neurological: No headache, dizziness or syncope Musculoskeletal:  No new muscle or joint pain Hematologic: No bleeding Psychiatric: No history of depression or anxiety   Physical Exam:  Vital signs: BP (!) 140/54 (BP Location: Left Arm, Patient Position: Sitting, Cuff Size: Normal)   Pulse 80   Ht 5' 2.75" (1.594 m) Comment: height measured without shoes  Wt 154 lb 8 oz (70.1 kg)   BMI 27.59 kg/m    Constitutional:   Pleasant Elderly Caucasian female appears to be in NAD, Well developed, Well nourished, alert and cooperative Respiratory: Respirations even and unlabored. Lungs clear to auscultation bilaterally.   No wheezes, crackles, or rhonchi.  Cardiovascular: Normal S1, S2. No MRG. Regular rate and rhythm. No peripheral edema, cyanosis or pallor.  Gastrointestinal:  Soft, nondistended, nontender. No rebound or guarding. Normal bowel sounds. No appreciable masses or hepatomegaly. Psychiatric: Demonstrates good judgement and reason without abnormal affect or behaviors.  MOST RECENT LABS AND IMAGING: CBC    Component Value Date/Time   WBC 6.7 07/23/2017 1542   RBC 3.97 07/23/2017 1542   HGB 11.6 (L) 07/23/2017 1542   HCT 34.9 (L) 07/23/2017 1542   PLT 207.0 07/23/2017 1542   MCV 88.0 07/23/2017 1542   MCH 27.5 08/07/2016 0938   MCHC 33.3 07/23/2017 1542   RDW 14.4 07/23/2017 1542   LYMPHSABS 2.5 07/23/2017 1542   MONOABS 0.6 07/23/2017 1542   EOSABS 0.1 07/23/2017 1542   BASOSABS 0.1 07/23/2017 1542    CMP     Component Value Date/Time   NA 138 07/23/2017 1542   NA 141 03/31/2015   K 4.6 07/23/2017 1542   CL 102 07/23/2017 1542   CO2 28 07/23/2017 1542   GLUCOSE 244 (H) 07/23/2017 1542   BUN 18 07/23/2017 1542   BUN 25 (A) 03/31/2015   CREATININE 1.30 (H) 07/23/2017 1542   CALCIUM 8.5 07/23/2017 1542  PROT 6.8 07/23/2017 1542   ALBUMIN 4.0 07/23/2017 1542   AST 16 07/23/2017 1542   ALT 13 07/23/2017 1542   ALKPHOS 71 07/23/2017 1542   BILITOT 0.3 07/23/2017 1542   GFRNONAA 33 (L) 08/07/2016 0938   GFRAA 39 (L) 08/07/2016  1610    Assessment: 1.  Dysphagia: Last EGD 07/30/2016 with a benign-appearing esophageal stenosis dilated to 20 mm, patient did well for 10 months, now with repeat symptoms; likely stenosis  Plan: 1.  Scheduled patient for repeat EGD with dilation in the [facility] with Dr. Christella Hartigan.  Did discuss risk, benefits, limitations alternatives and the patient agrees to proceed. 2.  Reviewed anti-dysphagia measures including taking small bites, drinking sips of water in between bites, chin tuck technique and avoiding distraction while eating. 3.  Continue Omeprazole 40 mg daily, 30-60 minutes before breakfast. 4.  Patient to follow in clinic per recommendations from Dr. Christella Hartigan after time of procedure.  Hyacinth Meeker, PA-C [facility] 08/14/2017, 9:52 AM  Cc: Allegra Grana, FNP

## 2017-08-14 NOTE — Patient Instructions (Signed)

## 2017-08-14 NOTE — Progress Notes (Signed)
I agree with the above note, plan 

## 2017-08-15 ENCOUNTER — Other Ambulatory Visit: Payer: Self-pay | Admitting: Family

## 2017-08-16 ENCOUNTER — Ambulatory Visit (AMBULATORY_SURGERY_CENTER): Payer: Medicare HMO | Admitting: Gastroenterology

## 2017-08-16 ENCOUNTER — Encounter: Payer: Self-pay | Admitting: Gastroenterology

## 2017-08-16 VITALS — BP 176/73 | HR 68 | Temp 97.7°F | Resp 14 | Ht 62.75 in | Wt 154.0 lb

## 2017-08-16 DIAGNOSIS — R1314 Dysphagia, pharyngoesophageal phase: Secondary | ICD-10-CM | POA: Diagnosis not present

## 2017-08-16 DIAGNOSIS — R1310 Dysphagia, unspecified: Secondary | ICD-10-CM | POA: Diagnosis not present

## 2017-08-16 DIAGNOSIS — E119 Type 2 diabetes mellitus without complications: Secondary | ICD-10-CM | POA: Diagnosis not present

## 2017-08-16 DIAGNOSIS — I10 Essential (primary) hypertension: Secondary | ICD-10-CM | POA: Diagnosis not present

## 2017-08-16 DIAGNOSIS — K222 Esophageal obstruction: Secondary | ICD-10-CM

## 2017-08-16 DIAGNOSIS — K449 Diaphragmatic hernia without obstruction or gangrene: Secondary | ICD-10-CM

## 2017-08-16 MED ORDER — SODIUM CHLORIDE 0.9 % IV SOLN: 500.0000 mL | Freq: Once | INTRAVENOUS | Status: DC

## 2017-08-16 NOTE — Progress Notes (Signed)
Called to room to assist during endoscopic procedure.  Patient ID and intended procedure confirmed with present staff. Received instructions for my participation in the procedure from the performing physician.  

## 2017-08-16 NOTE — Progress Notes (Signed)
Report given to PACU, vss 

## 2017-08-16 NOTE — Progress Notes (Signed)
Pt. Reports no change in her medical or surgical history since her pre-visit 08/14/2017.

## 2017-08-16 NOTE — Op Note (Signed)
[facility] Patient Name: Amy Calhoun Procedure Date: 08/16/2017 3:12 PM MRN: [id number] Endoscopist: Rachael Fee , MD Age: 84 Referring MD:  Date of Birth: 04-08-1928 Gender: Female Account #: [id number] Procedure:                Upper GI endoscopy Indications:              Dysphagia; Dysphagia: Last EGD 07/30/2016 with a                            benign-appearing esophageal stenosis dilated to 20                            mm, patient did well for 10 months, now with repeat                            symptoms; Medicines:                Monitored Anesthesia Care Procedure:                Pre-Anesthesia Assessment:                           - Prior to the procedure, a History and Physical                            was performed, and patient medications and                            allergies were reviewed. The patient's tolerance of                            previous anesthesia was also reviewed. The risks                            and benefits of the procedure and the sedation                            options and risks were discussed with the patient.                            All questions were answered, and informed consent                            was obtained. Prior Anticoagulants: The patient has                            taken no previous anticoagulant or antiplatelet                            agents. ASA Grade Assessment: II - A patient with                            mild systemic disease. After reviewing the risks  and benefits, the patient was deemed in                            satisfactory condition to undergo the procedure.                           After obtaining informed consent, the endoscope was                            passed under direct vision. Throughout the                            procedure, the patient's blood pressure, pulse, and                            oxygen saturations were monitored continuously.  The                            Endoscope was introduced through the mouth, and                            advanced to the second part of duodenum. The upper                            GI endoscopy was accomplished without difficulty.                            The patient tolerated the procedure well. Scope In: Scope Out: Findings:                 One benign-appearing, intrinsic mild stenosis                            (focal peptic stricture or thick Schatzki's ring)                            was found at the gastroesophageal junction. A TTS                            dilator was passed through the scope. Dilation with                            an 18-19-20 mm balloon dilator was performed to 20                            mm.                           A small hiatal hernia was present.                           The exam was otherwise without abnormality. Complications:            No immediate complications. Estimated blood loss:  None. Estimated Blood Loss:     Estimated blood loss: none. Impression:               - Benign-appearing esophageal stenosis. Dilated.                           - Small hiatal hernia. Recommendation:           - Patient has a contact number available for                            emergencies. The signs and symptoms of potential                            delayed complications were discussed with the                            patient. Return to normal activities tomorrow.                            Written discharge instructions were provided to the                            patient.                           - Resume previous diet.                           - Continue present medications. Rachael Feeaniel P Jacobs, MD 08/16/2017 3:47:26 PM This report has been signed electronically.

## 2017-08-16 NOTE — Patient Instructions (Signed)
  SOFT DIET INFORMATION GIVEN.     YOU HAD AN ENDOSCOPIC PROCEDURE TODAY AT THE [facility]:   Refer to the procedure report that was given to you for any specific questions about what was found during the examination.  If the procedure report does not answer your questions, please call your gastroenterologist to clarify.  If you requested that your care partner not be given the details of your procedure findings, then the procedure report has been included in a sealed envelope for you to review at your convenience later.  YOU SHOULD EXPECT: Some feelings of bloating in the abdomen. Passage of more gas than usual.  Walking can help get rid of the air that was put into your GI tract during the procedure and reduce the bloating. If you had a lower endoscopy (such as a colonoscopy or flexible sigmoidoscopy) you may notice spotting of blood in your stool or on the toilet paper. If you underwent a bowel prep for your procedure, you may not have a normal bowel movement for a few days.  Please Note:  You might notice some irritation and congestion in your nose or some drainage.  This is from the oxygen used during your procedure.  There is no need for concern and it should clear up in a day or so.  SYMPTOMS TO REPORT IMMEDIATELY:    Following upper endoscopy (EGD)  Vomiting of blood or coffee ground material  New chest pain or pain under the shoulder blades  Painful or persistently difficult swallowing  New shortness of breath  Fever of 100F or higher  Black, tarry-looking stools  For urgent or emergent issues, a gastroenterologist can be reached at any hour by calling [phone].   DIET:  We do recommend a small meal at first, but then you may proceed to your regular diet.  Drink plenty of fluids but you should avoid alcoholic beverages for 24 hours.  ACTIVITY:  You should plan to take it easy for the rest of today and you should NOT DRIVE or use heavy machinery until tomorrow  (because of the sedation medicines used during the test).    FOLLOW UP: Our staff will call the number listed on your records the next business day following your procedure to check on you and address any questions or concerns that you may have regarding the information given to you following your procedure. If we do not reach you, we will leave a message.  However, if you are feeling well and you are not experiencing any problems, there is no need to return our call.  We will assume that you have returned to your regular daily activities without incident.  If any biopsies were taken you will be contacted by phone or by letter within the next 1-3 weeks.  Please call us at [phone] if you have not heard about the biopsies in 3 weeks.    SIGNATURES/CONFIDENTIALITY: You and/or your care partner have signed paperwork which will be entered into your electronic medical record.  These signatures attest to the fact that that the information above on your After Visit Summary has been reviewed and is understood.  Full responsibility of the confidentiality of this discharge information lies with you and/or your care-partner.

## 2017-08-19 ENCOUNTER — Telehealth: Payer: Self-pay | Admitting: *Deleted

## 2017-08-19 NOTE — Telephone Encounter (Signed)
  Follow up Call-  Call back number 08/16/2017 07/30/2016  Post procedure Call Back phone  # [phone]  Permission to leave phone message Yes Yes  Some recent data might be hidden    [phone] Patient questions:LMOM

## 2017-08-19 NOTE — Telephone Encounter (Signed)
Last filled 08/15/17 Last office visit 03/27/17 No office visit

## 2017-08-19 NOTE — Telephone Encounter (Signed)
  Follow up Call-  Call back number 08/16/2017 07/30/2016  Post procedure Call Back phone  # [phone]  Permission to leave phone message Yes Yes  Some recent data might be hidden    Spoke with daughter Patient questions:  Do you have a fever, pain , or abdominal swelling? No. Pain Score  0 *  Have you tolerated food without any problems? Yes.    Have you been able to return to your normal activities? Yes.    Do you have any questions about your discharge instructions: Diet   No. Medications  No. Follow up visit  No.  Do you have questions or concerns about your Care? No.  Actions: * If pain score is 4 or above: No action needed, pain <4.

## 2017-08-20 ENCOUNTER — Telehealth: Payer: Self-pay | Admitting: Family

## 2017-08-20 ENCOUNTER — Other Ambulatory Visit: Payer: Self-pay

## 2017-08-20 MED ORDER — LINAGLIPTIN 5 MG PO TABS: 5.0000 mg | ORAL_TABLET | Freq: Every day | ORAL | 1 refills | Status: DC

## 2017-08-20 NOTE — Telephone Encounter (Signed)
rx sent to [facility]

## 2017-08-20 NOTE — Telephone Encounter (Signed)
Copied from CRM [id number]. Topic: Quick Communication - See Telephone Encounter >> Aug 20, 2017  1:04 PM Windy KalataMichael, Analyah Mcconnon L, NT wrote: CRM for notification. See Telephone encounter for: 08/20/17.  Patient daughter is calling and states the patient is completely out of TRADJENTA 5 MG TABS tablet. She states the pharmacy stated they called and sending multiple fax for over a week and has not had any luck getting a response.    [facility] [place] [address] [place] [id number] Phone: [phone] Fax: [phone]

## 2017-08-27 ENCOUNTER — Ambulatory Visit: Payer: Medicare HMO | Admitting: Gastroenterology

## 2017-08-31 DIAGNOSIS — R69 Illness, unspecified: Secondary | ICD-10-CM | POA: Diagnosis not present

## 2017-09-03 ENCOUNTER — Telehealth: Payer: Self-pay

## 2017-09-03 DIAGNOSIS — I10 Essential (primary) hypertension: Secondary | ICD-10-CM

## 2017-09-03 DIAGNOSIS — E1142 Type 2 diabetes mellitus with diabetic polyneuropathy: Secondary | ICD-10-CM

## 2017-09-03 DIAGNOSIS — E785 Hyperlipidemia, unspecified: Secondary | ICD-10-CM

## 2017-09-03 DIAGNOSIS — E039 Hypothyroidism, unspecified: Secondary | ICD-10-CM

## 2017-09-03 DIAGNOSIS — M4726 Other spondylosis with radiculopathy, lumbar region: Secondary | ICD-10-CM

## 2017-09-03 NOTE — Telephone Encounter (Signed)
Appointment scheduled follow up 10/14/17 at 2:00pm , daughter has radiation am  M-F , can you do labs at this  appointment

## 2017-09-03 NOTE — Telephone Encounter (Signed)
Copied from CRM [id number]. Topic: Appointment Scheduling - Scheduling Inquiry for Clinic >> Sep 02, 2017  4:53 PM Terisa Starraylor, Brittany L wrote: Reason for CRM: Patient's daughter [name], called and wanted to know if Effie ShyMaragret Arnett wanted her to come back in for labs, she said she comes every 3 months. Looking at AVS I don't see where she needed to come back in. Call back # [phone]

## 2017-09-09 NOTE — Telephone Encounter (Signed)
Lab appointment scheduled 

## 2017-09-09 NOTE — Addendum Note (Signed)
Addended by: Allegra GranaARNETT, MARGARET G on: 09/09/2017 10:08 AM   Modules accepted: Orders

## 2017-09-09 NOTE — Telephone Encounter (Signed)
Yes she needs labs.  I have ordered fasting labs. Please schedule an appt, she can do labs ahead of visit since fasting until 2pm is hard.

## 2017-09-19 ENCOUNTER — Other Ambulatory Visit: Payer: Self-pay | Admitting: Sports Medicine

## 2017-09-24 ENCOUNTER — Telehealth: Payer: Self-pay | Admitting: Gastroenterology

## 2017-09-24 NOTE — Telephone Encounter (Signed)
The pt's daughter states that her mother has issues with swalloing steak.  She was advised to have her mother avoid those tough meats and be sure she chews her foods well and takes small bites.  She will call back if she develops liquid or soft food dysphagia.

## 2017-10-03 DIAGNOSIS — R69 Illness, unspecified: Secondary | ICD-10-CM | POA: Diagnosis not present

## 2017-10-10 ENCOUNTER — Other Ambulatory Visit (INDEPENDENT_AMBULATORY_CARE_PROVIDER_SITE_OTHER): Payer: Medicare HMO

## 2017-10-10 DIAGNOSIS — E785 Hyperlipidemia, unspecified: Secondary | ICD-10-CM | POA: Diagnosis not present

## 2017-10-10 DIAGNOSIS — M4726 Other spondylosis with radiculopathy, lumbar region: Secondary | ICD-10-CM

## 2017-10-10 DIAGNOSIS — E039 Hypothyroidism, unspecified: Secondary | ICD-10-CM

## 2017-10-10 DIAGNOSIS — E1142 Type 2 diabetes mellitus with diabetic polyneuropathy: Secondary | ICD-10-CM

## 2017-10-10 LAB — COMPREHENSIVE METABOLIC PANEL
ALT: 13 U/L (ref 0–35)
AST: 14 U/L (ref 0–37)
Albumin: 4.2 g/dL (ref 3.5–5.2)
Alkaline Phosphatase: 70 U/L (ref 39–117)
BILIRUBIN TOTAL: 0.5 mg/dL (ref 0.2–1.2)
BUN: 33 mg/dL — ABNORMAL HIGH (ref 6–23)
CO2: 27 meq/L (ref 19–32)
Calcium: 9.2 mg/dL (ref 8.4–10.5)
Chloride: 102 mEq/L (ref 96–112)
Creatinine, Ser: 1.25 mg/dL — ABNORMAL HIGH (ref 0.40–1.20)
GFR: 43.08 mL/min — ABNORMAL LOW (ref >60.00)
GLUCOSE: 172 mg/dL — AB (ref 70–99)
Potassium: 4.6 mEq/L (ref 3.5–5.1)
Sodium: 137 mEq/L (ref 135–145)
Total Protein: 7.3 g/dL (ref 6.0–8.3)

## 2017-10-10 LAB — CBC WITH DIFFERENTIAL/PLATELET
BASOS ABS: 0.1 10*3/uL (ref 0.0–0.1)
Basophils Relative: 0.7 % (ref 0.0–3.0)
EOS ABS: 0.1 10*3/uL (ref 0.0–0.7)
Eosinophils Relative: 1.9 % (ref 0.0–5.0)
HEMATOCRIT: 37.2 % (ref 36.0–46.0)
Hemoglobin: 12.5 g/dL (ref 12.0–15.0)
LYMPHS ABS: 2.2 10*3/uL (ref 0.7–4.0)
Lymphocytes Relative: 29.3 % (ref 12.0–46.0)
MCHC: 33.5 g/dL (ref 30.0–36.0)
MCV: 86.3 fl (ref 78.0–100.0)
Monocytes Absolute: 0.7 10*3/uL (ref 0.1–1.0)
Monocytes Relative: 8.5 % (ref 3.0–12.0)
NEUTROS ABS: 4.6 10*3/uL (ref 1.4–7.7)
Neutrophils Relative %: 59.6 % (ref 43.0–77.0)
Platelets: 225 10*3/uL (ref 150.0–400.0)
RBC: 4.32 Mil/uL (ref 3.87–5.11)
RDW: 14.6 % (ref 11.5–15.5)
WBC: 7.7 10*3/uL (ref 4.0–10.5)

## 2017-10-10 LAB — LIPID PANEL
CHOLESTEROL: 214 mg/dL — AB (ref 0–200)
HDL: 55.6 mg/dL (ref >39.00)
LDL Cholesterol: 128 mg/dL — ABNORMAL HIGH (ref 0–99)
NONHDL: 158.35
Total CHOL/HDL Ratio: 4
Triglycerides: 150 mg/dL — ABNORMAL HIGH (ref 0.0–149.0)
VLDL: 30 mg/dL (ref 0.0–40.0)

## 2017-10-10 LAB — HEMOGLOBIN A1C: Hgb A1c MFr Bld: 7.8 % — ABNORMAL HIGH (ref 4.6–6.5)

## 2017-10-10 LAB — VITAMIN D 25 HYDROXY (VIT D DEFICIENCY, FRACTURES): VITD: 39.67 ng/mL (ref 30.00–100.00)

## 2017-10-10 LAB — TSH: TSH: 1.74 u[IU]/mL (ref 0.35–4.50)

## 2017-10-14 ENCOUNTER — Ambulatory Visit: Payer: Medicare HMO | Admitting: Family

## 2017-10-14 ENCOUNTER — Encounter: Payer: Self-pay | Admitting: Family

## 2017-10-14 VITALS — BP 140/70 | HR 76 | Temp 97.6°F | Resp 15 | Ht 63.0 in | Wt 157.4 lb

## 2017-10-14 DIAGNOSIS — E1142 Type 2 diabetes mellitus with diabetic polyneuropathy: Secondary | ICD-10-CM | POA: Diagnosis not present

## 2017-10-14 DIAGNOSIS — E785 Hyperlipidemia, unspecified: Secondary | ICD-10-CM | POA: Diagnosis not present

## 2017-10-14 DIAGNOSIS — I10 Essential (primary) hypertension: Secondary | ICD-10-CM | POA: Diagnosis not present

## 2017-10-14 DIAGNOSIS — Z23 Encounter for immunization: Secondary | ICD-10-CM

## 2017-10-14 DIAGNOSIS — M1612 Unilateral primary osteoarthritis, left hip: Secondary | ICD-10-CM | POA: Diagnosis not present

## 2017-10-14 MED ORDER — SIMVASTATIN 40 MG PO TABS: 40.0000 mg | ORAL_TABLET | Freq: Every day | ORAL | 1 refills | Status: DC

## 2017-10-14 MED ORDER — INSULIN NPH (HUMAN) (ISOPHANE) 100 UNIT/ML ~~LOC~~ SUSP: SUBCUTANEOUS | 2 refills | Status: DC

## 2017-10-14 MED ORDER — INSULIN REGULAR HUMAN 100 UNIT/ML IJ SOLN: INTRAMUSCULAR | 2 refills | Status: DC

## 2017-10-14 MED ORDER — ACETAMINOPHEN-CODEINE #3 300-30 MG PO TABS: 1.0000 | ORAL_TABLET | Freq: Every day | ORAL | 0 refills | Status: DC | PRN

## 2017-10-14 NOTE — Progress Notes (Addendum)
Subjective:    Patient ID: Amy Calhoun, female    DOB: 1928-12-20, 84 y.o.   MRN: [id number]  CC: Amy Calhoun is a 84 y.o. female who presents today for follow up.   HPI: Doing well today. No new complaints.  Accompanied by daughter.   Describes chronic low back and right hip pain, improved.  Cannot sleep on right side.  No falls.   Amy Calhoun tumeric for pain, then ibuprofen with usual relief. .  For severe pain, takes tyleonol #3. Worse on rainy days. Would like me to resume the prescription for tyleonol 3 since she is no longer seeing neu. Ice with some relief.   Swimming at [employer] with relief.   Neuropathy in feet- improved on gabapentin. No wounds.   HTN- compliant with medication. No CP.   DM - no hypoglycemic episodes. Lowest FBG 87. 15 units qam NPH, 3 units R qam. Checks blood sugar 4-5 x per day. Night dosing depends on blood sugar, NPH qam 10.   HLD- not on simvastatin. Has been taking omega 3.   Declines flu vaccine.     neurosurgeron- Dr Franky Macho, advised family that didn't feel that surgery was an option.  Follows with Dr Pecola Leisure, orthopedics. Last ov 11/2016.  MR lumbar 10/07/17 S/p laminectomy 07/2016 Dr Franky Macho HISTORY:  Past Medical History:  Diagnosis Date  . Chronic kidney disease 08/07/2016   Chronic renal failure, stage III  . Depression   . Esophageal dysphagia   . Gastric ulcer   . GERD (gastroesophageal reflux disease)   . Hyperlipidemia   . Hypertension   . Hypothyroidism   . IDDM (insulin dependent diabetes mellitus) (HCC)   . Ischemic colitis (HCC)   . Osteoarthritis   . Ulcer of esophagus    Past Surgical History:  Procedure Laterality Date  . ABDOMINAL HYSTERECTOMY    . COLONOSCOPY  2010  . ESOPHAGEAL DILATION  08/07/2016   usually a couple times a year, last time 07/30/16  . GALLBLADDER SURGERY    . LUMBAR LAMINECTOMY/DECOMPRESSION MICRODISCECTOMY N/A 08/10/2016   Procedure: BILATERAL HEMILAMINECTOMY LUMBAR  THREE-FOUR,LUMBAR FOUR-FIVE,LEFT LUMBAR FIVE-SACRAL ONE HEMILAMINECTOMY AND DECOMPRESSION;  Surgeon: Coletta Memos, MD;  Location: [facility] OR;  Service: Neurosurgery;  Laterality: N/A;  . THYROIDECTOMY     Family History  Problem Relation Age of Onset  . Diabetes Mother   . Arthritis Mother   . Hyperlipidemia Mother   . Mental illness Mother   . Heart disease Father   . Mental illness Sister   . Arthritis Maternal Grandmother   . Arthritis Maternal Grandfather   . Colon cancer Neg Hx   . Stomach cancer Neg Hx   . Esophageal cancer Neg Hx     Allergies: Pravachol [pravastatin] Current Outpatient Medications on File Prior to Visit  Medication Sig Dispense Refill  . acidophilus (RISAQUAD) CAPS capsule Take 1 capsule by mouth daily.    Marland Kitchen albuterol (PROVENTIL HFA;VENTOLIN HFA) 108 (90 Base) MCG/ACT inhaler Inhale 2 puffs into the lungs every 6 (six) hours as needed for wheezing or shortness of breath. 1 Inhaler 2  . amitriptyline (ELAVIL) 75 MG tablet Take 2 tablets (150 mg total) by mouth at bedtime. 180 tablet 1  . BD INSULIN SYRINGE U/F 31G X 5/16" 1 ML MISC USE AS DIRECTED TO ADMINISTER INSULIN 100 each 5  . Calcium Citrate-Vitamin D (CALCIUM + D PO) Take 1 tablet by mouth daily.    . Cholecalciferol (VITAMIN D) 2000 units tablet Take by  mouth.    . Coenzyme Q10 (CO Q 10) 100 MG CAPS Take 1 capsule by mouth daily.    . Continuous Blood Gluc Sensor (FREESTYLE LIBRE SENSOR SYSTEM) MISC Please dispense 3 sensors monthly 3 each 11  . CRANBERRY PO Take 1 tablet by mouth every Monday, Wednesday, and Friday.    . docusate sodium (COLACE) 100 MG capsule Take 1 capsule (100 mg total) by mouth 2 (two) times daily. (Patient taking differently: Take 100 mg by mouth every other day. ) 10 capsule 0  . gabapentin (NEURONTIN) 100 MG capsule TAKE 2 CAPSULES THREE TIMES A DAY 540 capsule 0  . glucagon (GLUCAGON EMERGENCY) 1 MG injection Inject 1 mg into the vein once as needed. For hypoglycemic episodes.  E11.42 1 each 3  . glucose blood test strip Use to test blood sugar up to 5 times daily. 200 each 12  . ibuprofen (ADVIL,MOTRIN) 600 MG tablet Take 1 tablet (600 mg total) by mouth 2 (two) times daily as needed. 180 tablet 0  . levothyroxine (SYNTHROID, LEVOTHROID) 150 MCG tablet Take 1 tablet (150 mcg total) by mouth daily. 90 tablet 0  . lidocaine (LIDODERM) 5 % PLACE 1 PATCH ON THE SKIN DAILY 90 patch 1  . linagliptin (TRADJENTA) 5 MG TABS tablet Take 1 tablet (5 mg total) by mouth daily. 90 tablet 1  . lisinopril (PRINIVIL,ZESTRIL) 10 MG tablet Take 1 tablet (10 mg total) by mouth daily. 90 tablet 1  . omeprazole (PRILOSEC) 40 MG capsule TAKE 1 CAPSULE DAILY 90 capsule 3  . pyridOXINE (VITAMIN B-6) 100 MG tablet Take 100 mg by mouth daily.    . riboflavin (VITAMIN B-2) 100 MG TABS tablet Take 100 mg by mouth daily.    Marland Kitchen senna-docusate (SENOKOT-S) 8.6-50 MG tablet Take 1 tablet by mouth at bedtime. (Patient taking differently: Take 1 tablet by mouth as needed. ) 30 tablet 5  . Turmeric 500 MG TABS Take 750 mg by mouth 2 (two) times daily.    . Continuous Blood Gluc Receiver (FREESTYLE LIBRE READER) DEVI 1 Device by Does not apply route once. 1 Device 0   Current Facility-Administered Medications on File Prior to Visit  Medication Dose Route Frequency Provider Last Rate Last Dose  . 0.9 %  sodium chloride infusion  500 mL Intravenous Once Rachael Fee, MD        Social History   Tobacco Use  . Smoking status: Never Smoker  . Smokeless tobacco: Never Used  Substance Use Topics  . Alcohol use: No  . Drug use: No    Review of Systems  Constitutional: Negative for chills and fever.  Respiratory: Negative for cough.   Cardiovascular: Negative for chest pain and palpitations.  Gastrointestinal: Negative for nausea and vomiting.  Musculoskeletal: Positive for back pain.  Neurological: Positive for numbness (BL feet).      Objective:    BP 140/70 (BP Location: Left Arm, Patient  Position: Sitting, Cuff Size: Normal)   Pulse 76   Temp 97.6 F (36.4 C) (Oral)   Resp 15   Ht 5\' 3"  (1.6 m)   Wt 157 lb 6 oz (71.4 kg)   SpO2 100%   BMI 27.88 kg/m  BP Readings from Last 3 Encounters:  10/14/17 140/70  08/16/17 (!) 176/73  08/14/17 (!) 140/54   Wt Readings from Last 3 Encounters:  10/14/17 157 lb 6 oz (71.4 kg)  08/16/17 154 lb (69.9 kg)  08/14/17 154 lb 8 oz (70.1 kg)  Physical Exam  Constitutional: She appears well-developed and well-nourished.  Eyes: Conjunctivae are normal.  Cardiovascular: Normal rate, regular rhythm, normal heart sounds and normal pulses.  Pulmonary/Chest: Effort normal and breath sounds normal. She has no wheezes. She has no rhonchi. She has no rales.  Neurological: She is alert.  Skin: Skin is warm and dry.  Psychiatric: She has a normal mood and affect. Her speech is normal and behavior is normal. Thought content normal.  Vitals reviewed.      Assessment & Plan:   Problem List Items Addressed This Visit      Cardiovascular and Mediastinum   Essential hypertension    At goal. No changes made      Relevant Medications   simvastatin (ZOCOR) 40 MG tablet     Endocrine   DM type 2 with diabetic peripheral neuropathy (HCC)    a1c 7.8 which I think is a reasonable goal in this patient. No changes to  Medicaiton.       Relevant Medications   insulin NPH Human (HUMULIN N) 100 UNIT/ML injection   insulin regular (HUMULIN R) 100 units/mL injection   simvastatin (ZOCOR) 40 MG tablet     Musculoskeletal and Integument   Primary osteoarthritis of left hip    Patient using tyleonol #3 appropriately prn for chronic LBP. NO falls. She has seen neurosurgery and cannot have any more surgery and asking me today to refill her medication.  She continues to follow with orthopedics, Dr Berline Chough. Controlled substance contract in place.  I looked up patient on  Controlled Substances Reporting System and saw no activity that raised concern  of inappropriate use.         Relevant Medications   acetaminophen-codeine (TYLENOL #3) 300-30 MG tablet     Other   Hyperlipidemia - Primary    LDL increased. She will start zocor again. Will follow.       Relevant Medications   simvastatin (ZOCOR) 40 MG tablet    Other Visit Diagnoses    Need for pneumococcal vaccination       Relevant Orders   Pneumococcal conjugate vaccine 13-valent IM (Completed)       I have discontinued Mariamawit M. Granada's acetaminophen-codeine. I have also changed her simvastatin and acetaminophen-codeine. Additionally, I am having her maintain her pyridOXINE, riboflavin, glucagon, docusate sodium, senna-docusate, lidocaine, acidophilus, CRANBERRY PO, Co Q 10, Calcium Citrate-Vitamin D (CALCIUM + D PO), FREESTYLE LIBRE READER, FREESTYLE LIBRE SENSOR SYSTEM, albuterol, glucose blood, Vitamin D, ibuprofen, levothyroxine, Turmeric, lisinopril, amitriptyline, omeprazole, BD INSULIN SYRINGE U/F, linagliptin, gabapentin, insulin NPH Human, and insulin regular. We will continue to administer sodium chloride.   Meds ordered this encounter  Medications  . insulin NPH Human (HUMULIN N) 100 UNIT/ML injection    Sig: INJECT 20 UNITS EVERY MORNING AND 10 UNITS AT NIGHT    Dispense:  30 mL    Refill:  2    Order Specific Question:   Supervising Provider    Answer:   Duncan Dull L [[id number]]  . insulin regular (HUMULIN R) 100 units/mL injection    Sig: INJECT 3 UNITS BEFORE BREAKFAST, 6 UNITS BEFORE LUNCH AND 6 UNITS BEFORE DINNER    Dispense:  30 mL    Refill:  2    Order Specific Question:   Supervising Provider    Answer:   Duncan Dull L [[id number]]  . simvastatin (ZOCOR) 40 MG tablet    Sig: Take 1 tablet (40 mg total) by mouth at bedtime.  Dispense:  90 tablet    Refill:  1    Order Specific Question:   Supervising Provider    Answer:   Duncan Dull L [[id number]]  . acetaminophen-codeine (TYLENOL #3) 300-30 MG tablet    Sig: Take 1 tablet by mouth daily as  needed for moderate pain.    Dispense:  30 tablet    Refill:  0    Order Specific Question:   Supervising Provider    Answer:   Sherlene Shams [[id number]]    Return precautions given.   Risks, benefits, and alternatives of the medications and treatment plan prescribed today were discussed, and patient expressed understanding.   Education regarding symptom management and diagnosis given to patient on AVS.  Continue to follow with Allegra Grana, FNP for routine health maintenance.   Tylor Mae [facility] and I agreed with plan.   Rennie Plowman, FNP   I have reviewed the above information and agree with above.   Duncan Dull, MD

## 2017-10-14 NOTE — Assessment & Plan Note (Addendum)
Patient using tyleonol #3 appropriately prn for chronic LBP. NO falls. She has seen neurosurgery and cannot have any more surgery and asking me today to refill her medication.  She continues to follow with orthopedics, Dr Berline Chough. Controlled substance contract in place.  I looked up patient on [place] Controlled Substances Reporting System and saw no activity that raised concern of inappropriate use.

## 2017-10-14 NOTE — Assessment & Plan Note (Signed)
LDL increased. She will start zocor again. Will follow.

## 2017-10-14 NOTE — Assessment & Plan Note (Signed)
a1c 7.8 which I think is a reasonable goal in this patient. No changes to  Medicaiton.

## 2017-10-14 NOTE — Patient Instructions (Addendum)
We will keep you on tylenol 3.   Start simvastatin again  Follow up 3 months

## 2017-10-14 NOTE — Assessment & Plan Note (Signed)
At goal. No changes made 

## 2017-10-15 ENCOUNTER — Other Ambulatory Visit: Payer: Self-pay | Admitting: Family

## 2017-10-15 NOTE — Telephone Encounter (Signed)
Last filled 07/17/17 Last office viist  Next office visit 01/13/18

## 2017-11-02 DIAGNOSIS — R69 Illness, unspecified: Secondary | ICD-10-CM | POA: Diagnosis not present

## 2017-11-03 ENCOUNTER — Other Ambulatory Visit: Payer: Self-pay | Admitting: Sports Medicine

## 2017-11-03 DIAGNOSIS — G8929 Other chronic pain: Secondary | ICD-10-CM

## 2017-11-03 DIAGNOSIS — K224 Dyskinesia of esophagus: Secondary | ICD-10-CM

## 2017-11-03 DIAGNOSIS — I10 Essential (primary) hypertension: Secondary | ICD-10-CM

## 2017-11-03 DIAGNOSIS — M48062 Spinal stenosis, lumbar region with neurogenic claudication: Secondary | ICD-10-CM

## 2017-11-03 DIAGNOSIS — N183 Chronic kidney disease, stage 3 unspecified: Secondary | ICD-10-CM

## 2017-11-21 ENCOUNTER — Ambulatory Visit: Payer: Medicare HMO | Admitting: Sports Medicine

## 2017-11-21 NOTE — Progress Notes (Signed)
Amy Calhoun. Amy Calhoun Sports Medicine [facility] [phone]  Amy Calhoun - 84 y.o. female MRN [id number]  Date of birth: 1928/10/14   Scribe for today's visit: Amy Calhoun, CMA    SUBJECTIVE:  Amy Calhoun is here for Follow-up (osteoarthritis L-spine)  10/31/2016: As below and per problem based documentation when appropriate.  Amy Calhoun is an established patient presenting today in follow-up of bilateral hip pain and thoracic spine pain. Amy Calhoun reports that there has been redness and swelling "like a softball" around the LT trochanter. Pain is described as sharp and throbbing and is rated 10/10 during flare-up. She was told not to take NSAIDs by her PCP d/t kidney function but this is the only thing that really seems to help with the pain. She has tried taking Tylenol Arthritis with no relief. She is no longer taking Norco/Vocodin or Tylenol 800 mg (s/p hemilaminectomy 07/2016). She is still taking Tylenol #3. She will typically have a flare-up about once a month which lasts about 5-7 days and seems to be random. Pain is worse when walking. She does experience groin pain during these flare-ups. She is currently getting over a UTI, finished abx Friday, and is not sure if groin pain is related to that. She has tried using a heating pad on the area but that seems to make pain worse. She has also tried applying heat to the area and she does get some relief with this. Laterality changes with each flare-up but the last was worse at the LT hip.   12/12/2016: Compared to the last office visit on 10/31/16, her previously described B hip and t-spine pain symptoms are improving, with less pain.  She states that her L hip is bothering her the most today. Current symptoms are moderate & are radiating to L thigh to her knee. She has been doing aquatic therapy, taking Tylenol #3 w/ 400mg  IBU in the morning and then 1/2 of a Tylenol#3.  Her pain is being  managed fairly well at the moment.  11/22/2017: Compared to the last office visit, her previously described symptoms show no change. She reports pain mostly when lying down.  Current symptoms are moderate & are radiating to the L leg and knee.  She has been doing aquatic therapy, she hasn't been going as often because her daughter is going through chemo. She has been taking Tylenol #3 and IBU with some relief.    ROS Reports night time disturbances. Denies fevers, chills, or night sweats. Denies unexplained weight loss. Denies personal history of cancer. Denies changes in bowel or bladder habits. Denies recent unreported falls. Reports new or worsening dyspnea or wheezing. Denies headaches or dizziness.  Reports numbness, tingling or weakness in the extremities.  Denies dizziness or presyncopal episodes Reports lower L extremity edema    HISTORY & PERTINENT PRIOR DATA:  Prior History reviewed and updated per electronic medical record. Significant history, findings, studies and interim changes include: No additional findings.  reports that she has never smoked. She has never used smokeless tobacco. Recent Labs    10/10/17 0906  HGBA1C 7.8*   No problems updated.   OBJECTIVE:  VS:  HT:5\' 3"  (160 cm)   WT:157 lb 3.2 oz (71.3 kg)  BMI:27.85    BP:(!) 150/52  HR:80bpm  TEMP: ( )  RESP:93 %  PHYSICAL EXAM: Constitutional: WDWN, Non-toxic appearing. Psychiatric: Alert & appropriately interactive.Not depressed or anxious appearing. Respiratory: No increased work of breathing.  Trachea Midline Eyes: Pupils are equal. EOM intact without nystagmus. No scleral icterus   LOWER EXTREMITIES: No clubbing or cyanosis appreciated No significant venous stasis changes No calf tenderness, negative Homan's sign, no calf cords Generalized/Pre-tibial edema: 1+, bilatrally Pedal Pulses: Normal & symmetrically palpable  Sensation in LE dermatomes: intact to light touch   Negative  straight leg raises.  Her sit/stand test is improved.  She is much more alert appearing today.  Small amount of pain with Stinchfield testing and terminal IR on the left.  ASSESSMENT & PLAN:   1. Primary osteoarthritis of left hip   2. CKD (chronic kidney disease) stage 3, GFR 30-59 ml/min (HCC)   3. Osteoarthritis of spine with radiculopathy, lumbar region   4. Other chronic pain   5. Esophageal dysmotility   6. Essential hypertension   7. Lumbar stenosis with neurogenic claudication    Plan: She has done quite well and has significantly increased her activity especially in the pool..  We discussed multiple options regarding discontinuation of anti-inflammatories but they feels that she is unable to do this at this time due to the ongoing pain.  We discussed multiple strategies including bursting and trial of turmeric OTC.  They do understand the risk of worsening kidney function and increased cardiovascular risks with NSAID use.  We did discuss that we will have the option to perform an intra-articular injection for her left hip if this continues to be bothersome for her.  No problem-specific Assessment & Plan notes found for this encounter.  >50% of this 25 minute visit spent in direct patient counseling and/or coordination of care.  Discussion was focused on education regarding the in discussing the pathoetiology and anticipated clinical course of the above condition.   ++++++++++++++++++++++++++++++++++++++++++++ Follow-up: Return if symptoms worsen or fail to improve.      CMA/ATC served as Neurosurgeon during this visit. History, Physical, and Plan performed by medical provider. Documentation and orders reviewed and attested to.      Andrena Mews, DO    [facility] Physician

## 2017-11-22 ENCOUNTER — Ambulatory Visit: Payer: Medicare HMO | Admitting: Sports Medicine

## 2017-11-22 ENCOUNTER — Encounter: Payer: Self-pay | Admitting: Sports Medicine

## 2017-11-22 VITALS — BP 150/52 | HR 80 | Ht 63.0 in | Wt 157.2 lb

## 2017-11-22 DIAGNOSIS — N183 Chronic kidney disease, stage 3 unspecified: Secondary | ICD-10-CM

## 2017-11-22 DIAGNOSIS — K224 Dyskinesia of esophagus: Secondary | ICD-10-CM | POA: Diagnosis not present

## 2017-11-22 DIAGNOSIS — M48062 Spinal stenosis, lumbar region with neurogenic claudication: Secondary | ICD-10-CM | POA: Diagnosis not present

## 2017-11-22 DIAGNOSIS — M4726 Other spondylosis with radiculopathy, lumbar region: Secondary | ICD-10-CM | POA: Diagnosis not present

## 2017-11-22 DIAGNOSIS — I10 Essential (primary) hypertension: Secondary | ICD-10-CM | POA: Diagnosis not present

## 2017-11-22 DIAGNOSIS — M1612 Unilateral primary osteoarthritis, left hip: Secondary | ICD-10-CM

## 2017-11-22 DIAGNOSIS — G8929 Other chronic pain: Secondary | ICD-10-CM | POA: Diagnosis not present

## 2017-11-22 MED ORDER — IBUPROFEN 600 MG PO TABS: 600.0000 mg | ORAL_TABLET | Freq: Two times a day (BID) | ORAL | 0 refills | Status: DC | PRN

## 2017-12-11 DIAGNOSIS — R69 Illness, unspecified: Secondary | ICD-10-CM | POA: Diagnosis not present

## 2017-12-16 ENCOUNTER — Encounter: Payer: Self-pay | Admitting: Family

## 2017-12-16 ENCOUNTER — Telehealth: Payer: Self-pay | Admitting: Family

## 2017-12-16 ENCOUNTER — Ambulatory Visit: Payer: Self-pay

## 2017-12-16 ENCOUNTER — Other Ambulatory Visit: Payer: Self-pay | Admitting: Family

## 2017-12-16 DIAGNOSIS — E1142 Type 2 diabetes mellitus with diabetic polyneuropathy: Secondary | ICD-10-CM

## 2017-12-16 DIAGNOSIS — M1612 Unilateral primary osteoarthritis, left hip: Secondary | ICD-10-CM

## 2017-12-16 NOTE — Telephone Encounter (Signed)
Copied from CRM [phone]. Topic: Quick Communication - Rx Refill/Question >> Dec 16, 2017  8:22 AM Baldo DaubAlexander, Amber L wrote: Medication: acetaminophen-codeine (TYLENOL #3) 300-30 MG tablet  Has the patient contacted their pharmacy? Yes - pharmacy told pt to call PCP (Agent: If no, request that the patient contact the pharmacy for the refill.) (Agent: If yes, when and what did the pharmacy advise?)  Preferred Pharmacy (with phone number or street name): [id number] [place], [place] - [address] [phone] (Phone) [phone] (Fax)  Agent: Please be advised that RX refills may take up to 3 business days. We ask that you follow-up with your pharmacy.

## 2017-12-16 NOTE — Telephone Encounter (Signed)
Pharmacist has never seen pt and would need new referral sent.

## 2017-12-16 NOTE — Telephone Encounter (Signed)
Agree with appropriateness of rx.    Regards,   Duncan Dulleresa Quashaun Lazalde, MD

## 2017-12-16 NOTE — Telephone Encounter (Signed)
Spoke with Amy Calhoun  Would like home visit with pharmacist- Catie can you do this?  This morning FBG 104.  Had two pieces of toast today and has held insulin R and NPH. Checks blood sugar few times per day. Amy Calhoun offers she can take picture of blood sugars.   Eats lunch at 1130.  Lows occurred at 3-5pm 'because she forgets to eat an afternoon snack'  States mother doesn't articulate that she is having a hypoglycemic episode.   Advised hold ALL insulin for now. Send me blood sugar readings or drop it.   Would like to consider oral or injectable regimen. [facility].   No h/o personal or family thyroid cancer. Patients thyroid removed for 'benign tumors. '  H/o CKD.

## 2017-12-16 NOTE — Progress Notes (Signed)
thn referral placed

## 2017-12-16 NOTE — Telephone Encounter (Signed)
  Daughter called report pt had 2 episodes of low blood sugar to 58 and 68. Daughter stated that she did not want to talk to Nurse triage she wanted to talk with the pharmacist at PCP office. Amy Calhoun and he stated to send a triage note high priority.  Daughter was not with pt at the time of the call. Daughter wanting to know what adjustment need to be made with the insulin.  Reason for Disposition . [1] Caller has NON-URGENT medication or insulin pump question AND [2] triager unable to answer question  Answer Assessment - Initial Assessment Questions 1. SYMPTOMS: "What symptoms are you concerned about?"     Blood sugars  2. ONSET:  "When did the symptoms start?"     This weekend  3. BLOOD GLUCOSE: "What is your blood glucose level?"     Daughter is not with pt.  4. USUAL RANGE: "What is your blood glucose level usually?" (e.g., usual fasting morning value, usual evening value)     90-130    Low 200's evenings: 200 98 5. TYPE 1 or 2:  "Do you know what type of diabetes you have?"  (e.g., Type 1, Type 2, Gestational; doesn't know)      Type 2 6. INSULIN: "Do you take insulin?" "What type of insulin(s) do you use? What is the mode of delivery? (syringe, pen (e.g., injection or  pump)  Regular and NPH    7. DIABETES PILLS: "Do you take any pills for your diabetes?"  no 8. OTHER SYMPTOMS: "Do you have any symptoms?" (e.g., fever, frequent urination, difficulty breathing, vomiting)     Daughter stated her mother walks on her tiptoes to the right foot. 9. LOW BLOOD GLUCOSE TREATMENT: "What have you done so far to treat the low blood glucose level?"     kool aid, soda and protein drinks  10. FOOD: "When did you last eat or drink?"       Daughter is not with pt this morning 11. ALONE: "Are you alone right now or is someone with you?"       Daughter is on her way to see her.  12. PREGNANCY: "Is there any chance you are pregnant?" "When was your last menstrual period?"       n/a  Protocols  used: DIABETES - LOW BLOOD SUGAR-A-AH

## 2017-12-16 NOTE — Telephone Encounter (Signed)
Medication filled on 12/16/17

## 2017-12-16 NOTE — Telephone Encounter (Signed)
FYI Amy Calhoun as patient is on chronic narcotics for low back and hip pain.    Daughter states needs a refill tyelonol #3.   Per daughter, takes one tablet QOD. No falls. Low back and hip continue to bother her. Last refill of 30 tablets 10/14/2017.   Taking ibuprofen 600mg ; advised to stop due to CKD.   May take tyleonol arthritis  Ensure to use tylenol #3 sparingly and not take with other sources of tylenol  I looked up patient on  Controlled Substances Reporting System and saw no activity that raised concern of inappropriate use.   F/u in 3 months

## 2017-12-17 NOTE — Telephone Encounter (Signed)
Call patient's daiughter [name]  Let her know that [facility] which will allow pharmacy to come out to her mom's house again. Please let me know if she has not heard from them in a couple a days   For now, please continue to hold ALL insulin. I reviewed blood sugars and honestly , I would like to see trend as long her mother's average blood sugar is less than 200/205 ( a1c 8.5%) , I would be comfortable with that based on her age and other comorbities.  She is on the oral agent , trajenta, which we can stay on for now.

## 2017-12-18 ENCOUNTER — Other Ambulatory Visit: Payer: Self-pay

## 2017-12-18 NOTE — Telephone Encounter (Signed)
I spoke with patient's daughter & she is concerned that without insulin her blood sugars are too high. Yesterday she ended up giving her insulin & her blood sugar was 312. They are going on vacation starting tomorrow & worries with traveling plus eating out her BS will be too high. I informed her of [facility] & she stated that she would call if she has not heard anything. She also wanted [facility] to call her because her mom doesn't always answer the phone & is extremely hard of hearing.

## 2017-12-18 NOTE — Telephone Encounter (Signed)
spoke with daughter   She is with her mom and she feels comfortable with managing mom's DM. She is there to ensure she checks TID and has snacks. No more hypoglycemic episodes.  Declines endocrine consult  Daughter thinks that she was taking more NSAIDs w/ last renal function  We will recheck next week and see if potentially more medications for mother available.   Discussed stopping meal time insulin as very burdensome and having long acting insulin alone.

## 2017-12-18 NOTE — Patient Outreach (Signed)
[facility]) Care Management  12/18/2017  Amy Calhoun Amy Calhoun 12/13/1928 [id number]   Referral Date: 12/16/17 Referral Source: Md referral Referral Reason: diabetic options   Outreach Attempt: spoke with daughter [name].  She is able to verify HIPAA.  She states that they are wanting pharmacy to come in to discuss medication options for diabetic control other than insulin.  She states that her mother getting older and has had some lows with her blood sugars.  Patient lives in independent living community and is able to function independently but patient is hard of hearing.   Daughter fixes her pill box for two weeks but patient doses her own insulin.    Discussed [facility] services.  Daughter is agreeable to pharmacy services at this time.     Plan:RN CM will refer to pharmacy for medication management.  [name] [name], RN, MSN [facility] Care [facility] Care Management Coordinator Direct Line [phone] Toll Free: [phone]  Fax: [phone]

## 2017-12-19 ENCOUNTER — Telehealth: Payer: Self-pay | Admitting: Pharmacist

## 2017-12-20 NOTE — Patient Outreach (Signed)
[facility]) Care Management  [facility]   12/20/2017  Amy Calhoun 1928/01/20 [id number]   Reason for referral: medication management  Referral source: MD referral Current insurance:Aetna   HPI: (including but not limited to:) CKD stage III, chronic pain, type 2 diabetes, hypertension, osteoarthritis,  Hypothyroidism,   Called patient's daughter Amy Calhoun) as instructed by the referral. HIPAA identifiers were obtained. Patient was actually on speaker phone in the car.  Amy Calhoun said she was told she would get a home visit with a [facility] Pharmacist to discuss alternatives to insulin for her mother. She is interested in stopping insulin due to hypoglycemia as well as visual acuity and dexterity issues of her mother.   Objective: Lab Results  Component Value Date   CREATININE 1.25 (H) 10/10/2017   CREATININE 1.30 (H) 07/23/2017   CREATININE 1.08 01/02/2017    Lab Results  Component Value Date   HGBA1C 7.8 (H) 10/10/2017    Lipid Panel     Component Value Date/Time   CHOL 214 (H) 10/10/2017 0906   TRIG 150.0 (H) 10/10/2017 0906   HDL 55.60 10/10/2017 0906   CHOLHDL 4 10/10/2017 0906   VLDL 30.0 10/10/2017 0906   LDLCALC 128 (H) 10/10/2017 0906    BP Readings from Last 3 Encounters:  11/22/17 (!) 150/52  10/14/17 140/70  08/16/17 (!) 176/73    Allergies  Allergen Reactions  . Pravachol [Pravastatin] Other (See Comments)    States she refused due to side effects    Medications Reviewed Today    Reviewed by Beecher Mcardle, [facility] (Pharmacist) on 12/19/17 at 1457  Med List Status: <None>  Medication Order Taking? Sig Documenting Provider Last Dose Status Informant  0.9 %  sodium chloride infusion [id number]   Rachael Fee, MD  Active   acetaminophen-codeine (TYLENOL #3) 300-30 MG tablet [id number] Yes TAKE 1 TABLET BY MOUTH DAILY AS NEEDED FOR MODERATE PAIN. Allegra Grana, FNP Taking Active   acidophilus (RISAQUAD) CAPS capsule [id number] Yes  Take 1 capsule by mouth daily. [provider] Taking Active Child  albuterol (PROVENTIL HFA;VENTOLIN HFA) 108 (90 Base) MCG/ACT inhaler [id number] Yes Inhale 2 puffs into the lungs every 6 (six) hours as needed for wheezing or shortness of breath. Allegra Grana, FNP Taking Active   amitriptyline (ELAVIL) 75 MG tablet [id number] Yes TAKE 2 TABLETS AT BEDTIME Allegra Grana, FNP Taking Active   BD INSULIN SYRINGE U/F 31G X 5/16" 1 ML MISC [id number] Yes USE AS DIRECTED TO ADMINISTER INSULIN Allegra Grana, FNP Taking Active   Cholecalciferol (VITAMIN D) 2000 units tablet [id number]  Take by mouth. [provider]  Active   Coenzyme Q10 (CO Q 10) 100 MG CAPS [id number] Yes Take 1 capsule by mouth daily. [provider] Taking Active Child  Continuous Blood Gluc Receiver (FREESTYLE LIBRE READER) DEVI [id number]  1 Device by Does not apply route once. Allegra Grana, FNP  Expired 10/30/16 2359   Continuous Blood Gluc Sensor (FREESTYLE LIBRE SENSOR SYSTEM) MISC [id number] No Please dispense 3 sensors monthly  Patient not taking:  Reported on 12/19/2017   Allegra Grana, FNP Not Taking Active            Med Note Primus Bravo Dec 19, 2017  2:54 PM) Did not get due to cost  CRANBERRY PO [id number] Yes Take 1 tablet by mouth every Monday, Wednesday, and Friday. [provider] Taking Active Child  docusate sodium (COLACE) 100  MG capsule [id number] Yes Take 1 capsule (100 mg total) by mouth 2 (two) times daily.  Patient taking differently:  Take 100 mg by mouth every other day.    Katharina Caper, MD Taking Active Child  gabapentin (NEURONTIN) 100 MG capsule [id number] Yes TAKE 2 CAPSULES THREE TIMES A DAY Andrena Mews, DO Taking Active   glucagon (GLUCAGON EMERGENCY) 1 MG injection [id number] Yes Inject 1 mg into the vein once as needed. For hypoglycemic episodes. E11.42 Tommie Sams, DO Taking Active Child  glucose blood test strip [id number] Yes Use  to test blood sugar up to 5 times daily. Sherlene Shams, MD Taking Active   ibuprofen (ADVIL,MOTRIN) 600 MG tablet [id number] Yes Take 1 tablet (600 mg total) by mouth 2 (two) times daily as needed. Andrena Mews, DO Taking Active   insulin NPH Human (HUMULIN N) 100 UNIT/ML injection [id number] Yes INJECT 20 UNITS EVERY MORNING AND 10 UNITS AT Sol Passer, FNP Taking Active   insulin regular (HUMULIN R) 100 units/mL injection [id number] Yes INJECT 3 UNITS BEFORE BREAKFAST, 6 UNITS BEFORE LUNCH AND 6 UNITS BEFORE DINNER Arnett, Lyn Records, FNP Taking Active   levothyroxine (SYNTHROID, LEVOTHROID) 150 MCG tablet [id number] Yes Take 1 tablet (150 mcg total) by mouth daily. Allegra Grana, FNP Taking Active   lidocaine (LIDODERM) 5 % [id number] Yes PLACE 1 PATCH ON THE SKIN DAILY Tommie Sams, DO Taking Active Child  linagliptin (TRADJENTA) 5 MG TABS tablet [id number] Yes Take 1 tablet (5 mg total) by mouth daily. Allegra Grana, FNP Taking Active   lisinopril (PRINIVIL,ZESTRIL) 10 MG tablet [id number] Yes Take 1 tablet (10 mg total) by mouth daily. Allegra Grana, FNP Taking Active   omeprazole (PRILOSEC) 40 MG capsule [id number] Yes TAKE 1 CAPSULE DAILY Zehr, Princella Pellegrini, PA-C Taking Active   pyridOXINE (VITAMIN B-6) 100 MG tablet [id number] Yes Take 100 mg by mouth daily. [provider] Taking Active Child  riboflavin (VITAMIN B-2) 100 MG TABS tablet [id number] Yes Take 100 mg by mouth daily. [provider] Taking Active Child  senna-docusate (SENOKOT-S) 8.6-50 MG tablet [id number] No Take 1 tablet by mouth at bedtime.  Patient not taking:  Reported on 12/19/2017   Katharina Caper, MD Not Taking Active Child  simvastatin (ZOCOR) 40 MG tablet [id number] Yes Take 1 tablet (40 mg total) by mouth at bedtime. Allegra Grana, FNP Taking Active   Turmeric 500 MG TABS [id number] Yes Take 750 mg by mouth 2 (two) times daily. [provider] Taking Active            Assessment:  Drugs sorted by system:  Neurologic/Psychologic: Amitriptyline, Gabapentin  Cardiovascular: Lisinopril  Gastrointestinal: Acidophilus (PRN), Docusate sodium, Omeprazole,   Endocrine: Glucagon, Humulin N and R, Levothyroxine, Tradgenta,   Pain: Acetaminophen, Ibuprofen, Lidocaine patch,   Vitamins/Minerals/Supplements: Coenzyme 10, Cranberry, Pyridoxine, Riboflavin, turmeric, Cholecalciferol,  Miscellaneous:  Medication Review Findings:  . Ibuprofen/CKD stage III   Plan/interventions:  -Patient's daughter demanded a home visit. Home visit scheduled for Wednesday December 25, 2017 at 9:30am.   -Patient's daughter had questions about Glipizide, metformin and MOA of GLP-1 inhibitors.    -Patient has Community education officer with Part D plan through [employer].  Had difficulty finding the correct formulary to research prices.  -Reviewed patient's chart. On 12/16/17 her PCP, Rennie Plowman documented the following in a telephone call note:  For now, please continue to hold ALL insulin. I reviewed blood sugars and honestly , I would like  to see trend as long her mother's average blood sugar is less than 200/205 ( a1c 8.5%) , I would be comfortable with that based on her age and other commodities.  She is on the oral agent , trajenta, which we can stay on for now.   Beecher McardleKatina J. Bonna Steury, PharmD, BCACP [facility] Clinical Pharmacist [phone]

## 2017-12-23 ENCOUNTER — Other Ambulatory Visit (INDEPENDENT_AMBULATORY_CARE_PROVIDER_SITE_OTHER): Payer: Medicare HMO

## 2017-12-23 DIAGNOSIS — E1142 Type 2 diabetes mellitus with diabetic polyneuropathy: Secondary | ICD-10-CM

## 2017-12-23 LAB — COMPREHENSIVE METABOLIC PANEL
ALT: 18 U/L (ref 0–35)
AST: 23 U/L (ref 0–37)
Albumin: 4.2 g/dL (ref 3.5–5.2)
Alkaline Phosphatase: 75 U/L (ref 39–117)
BUN: 22 mg/dL (ref 6–23)
CO2: 29 mEq/L (ref 19–32)
Calcium: 8.5 mg/dL (ref 8.4–10.5)
Chloride: 99 mEq/L (ref 96–112)
Creatinine, Ser: 1.34 mg/dL — ABNORMAL HIGH (ref 0.40–1.20)
GFR: 39.74 mL/min — ABNORMAL LOW (ref >60.00)
Glucose, Bld: 301 mg/dL — ABNORMAL HIGH (ref 70–99)
Potassium: 4.9 mEq/L (ref 3.5–5.1)
Sodium: 137 mEq/L (ref 135–145)
Total Bilirubin: 0.4 mg/dL (ref 0.2–1.2)
Total Protein: 7.2 g/dL (ref 6.0–8.3)

## 2017-12-23 NOTE — Addendum Note (Signed)
Addended by: WRIGHT, LATOYA S on: 12/23/2017 03:03 PM   Modules accepted: Orders  

## 2017-12-23 NOTE — Addendum Note (Signed)
Addended by: Warden FillersWRIGHT, Willadeen Colantuono S on: 12/23/2017 03:03 PM   Modules accepted: Orders

## 2017-12-24 LAB — MICROALBUMIN / CREATININE URINE RATIO
Creatinine, Urine: 62.7 mg/dL
Microalb/Creat Ratio: 31.7 mg/g creat — ABNORMAL HIGH (ref 0.0–30.0)
Microalbumin, Urine: 19.9 ug/mL

## 2017-12-25 ENCOUNTER — Other Ambulatory Visit: Payer: Self-pay | Admitting: Pharmacist

## 2017-12-25 ENCOUNTER — Ambulatory Visit: Payer: Self-pay | Admitting: Pharmacist

## 2017-12-25 NOTE — Patient Outreach (Signed)
[facility]) Care Management  [place]   12/25/2017  Amy Calhoun 1928-04-23 [id number]   Reason for referral: Home visit for diabetes therapy suggestions  Referral source: MD Current insurance:Aetna  Home visit completed at the patient's home. Her daughter and caregiver, [name] was present for the visit. Patient is an 84 year old female with multiple medical conditions including but not limited to:  Chronic pain (hips & arthritis), type 2 diabetes (A1c 7.8%), hypertension, hyperlipidemia, hypothyroidism and recurrent urinary tract infections.  Patient's daughter was interested in discontinuing the patient's insulin therapy and starting another oral medication.   Objective: Calculated Creatinine clearance - 35-40 ml/min Lab Results  Component Value Date   CREATININE 1.34 (H) 12/23/2017   CREATININE 1.25 (H) 10/10/2017   CREATININE 1.30 (H) 07/23/2017    Lab Results  Component Value Date   HGBA1C 7.8 (H) 10/10/2017    Lipid Panel     Component Value Date/Time   CHOL 214 (H) 10/10/2017 0906   TRIG 150.0 (H) 10/10/2017 0906   HDL 55.60 10/10/2017 0906   CHOLHDL 4 10/10/2017 0906   VLDL 30.0 10/10/2017 0906   LDLCALC 128 (H) 10/10/2017 0906    BP Readings from Last 3 Encounters:  11/22/17 (!) 150/52  10/14/17 140/70  08/16/17 (!) 176/73    Allergies  Allergen Reactions  . Pravachol [Pravastatin] Other (See Comments)    States she refused due to side effects    Medications Reviewed Today    Reviewed by Elayne Guerin, [facility] (Pharmacist) on 12/25/17 at [place] List Status: <None>  Medication Order Taking? Sig Documenting Provider Last Dose Status Informant  0.9 %  sodium chloride infusion [id number]   Milus Banister, MD  Active   acetaminophen-codeine (TYLENOL #3) 300-30 MG tablet [id number] Yes TAKE 1 TABLET BY MOUTH DAILY AS NEEDED FOR MODERATE PAIN. Burnard Hawthorne, FNP Taking Active   acidophilus (RISAQUAD) CAPS capsule  [id number] Yes Take 1 capsule by mouth daily. [provider] Taking Active Child  albuterol (PROVENTIL HFA;VENTOLIN HFA) 108 (90 Base) MCG/ACT inhaler [id number] Yes Inhale 2 puffs into the lungs every 6 (six) hours as needed for wheezing or shortness of breath. Burnard Hawthorne, FNP Taking Active   amitriptyline (ELAVIL) 75 MG tablet [id number] Yes TAKE 2 TABLETS AT BEDTIME Burnard Hawthorne, FNP Taking Active   BD INSULIN SYRINGE U/F 31G X 5/16" 1 ML MISC [id number] Yes USE AS DIRECTED TO ADMINISTER INSULIN Burnard Hawthorne, FNP Taking Active   Cholecalciferol (VITAMIN D) 2000 units tablet [id number] Yes Take by mouth. [provider] Taking Active   Coenzyme Q10 (CO Q 10) 100 MG CAPS [id number] Yes Take 1 capsule by mouth daily. [provider] Taking Active Child  CRANBERRY PO [id number] Yes Take 1 tablet by mouth every Monday, Wednesday, and Friday. [provider] Taking Active Child  docusate sodium (COLACE) 100 MG capsule [id number] Yes Take 1 capsule (100 mg total) by mouth 2 (two) times daily.  Patient taking differently:  Take 100 mg by mouth every other day.    Theodoro Grist, MD Taking Active Child  gabapentin (NEURONTIN) 100 MG capsule [id number] Yes TAKE 2 CAPSULES THREE TIMES A DAY Gerda Diss, DO Taking Active   glucagon (GLUCAGON EMERGENCY) 1 MG injection [id number] Yes Inject 1 mg into the vein once as needed. For hypoglycemic episodes. E11.42 Coral Spikes, DO Taking Active Child  glucose blood test strip [id number] Yes Use to test blood sugar up  to 5 times daily. Crecencio Mc, MD Taking Active   insulin NPH Human (HUMULIN N) 100 UNIT/ML injection [id number] Yes INJECT 20 UNITS EVERY MORNING AND 10 UNITS AT Carmon Sails, FNP Taking Active   insulin regular (HUMULIN R) 100 units/mL injection [id number] Yes INJECT 3 UNITS BEFORE BREAKFAST, 6 UNITS BEFORE LUNCH AND 6 UNITS BEFORE DINNER Arnett, Yvetta Coder, FNP Taking Active    levothyroxine (SYNTHROID, LEVOTHROID) 150 MCG tablet [id number] Yes Take 1 tablet (150 mcg total) by mouth daily. Burnard Hawthorne, FNP Taking Active   lidocaine (LIDODERM) 5 % [id number] Yes PLACE 1 PATCH ON THE SKIN DAILY Coral Spikes, DO Taking Active Child  linagliptin (TRADJENTA) 5 MG TABS tablet [id number] Yes Take 1 tablet (5 mg total) by mouth daily. Burnard Hawthorne, FNP Taking Active   lisinopril (PRINIVIL,ZESTRIL) 10 MG tablet [id number] Yes Take 1 tablet (10 mg total) by mouth daily. Burnard Hawthorne, FNP Taking Active   omeprazole (PRILOSEC) 40 MG capsule [id number] Yes TAKE 1 CAPSULE DAILY Zehr, Laban Emperor, PA-C Taking Active   pyridOXINE (VITAMIN B-6) 100 MG tablet [id number] Yes Take 100 mg by mouth daily. [provider] Taking Active Child  riboflavin (VITAMIN B-2) 100 MG TABS tablet [id number] Yes Take 100 mg by mouth daily. [provider] Taking Active Child  senna-docusate (SENOKOT-S) 8.6-50 MG tablet [id number] Yes Take 1 tablet by mouth at bedtime. Theodoro Grist, MD Taking Active Child  simvastatin (ZOCOR) 40 MG tablet [id number] Yes Take 1 tablet (40 mg total) by mouth at bedtime. Burnard Hawthorne, FNP Taking Active   Turmeric 500 MG TABS [id number] Yes Take 750 mg by mouth 2 (two) times daily. [provider] Taking Active           Assessment:  Drugs sorted by system:  Neurologic/Psychologic: Amitriptyline, Gabapentin  Cardiovascular: Lisinopril, Fish Oil  Gastrointestinal: Acidophilus (PRN), Docusate sodium, Omeprazole,   Endocrine: Glucagon, Humulin N and R, Levothyroxine, Tradgenta,   Pain: Acetaminophen with codeine, Lidocaine patch,  Vitamins/Minerals/Supplements: Coenzyme 10, Cranberry, Pyridoxine, Riboflavin, turmeric, Cholecalciferol,    Interventions/Suggestions  -Reviewed, GLP-1, SGLT2, DPP4 inhibitors, and Basal insulin with the patients daughter.  -Reviewed 2019 ADA treatment algorithm  -Educated  patient on cardiovascular finding with diabetes medications  -Discussed the patient's renal function in relation to therapy. (Calculated CrCl based on most recent Scr was 35-59m/min) (i.e.-not a good candidate for SGLT2 due to renal function and recurrent UTI or Metformin due to decreased renal function and reported GI issues)  . -Patient's daughter was very adamant about wanting her mother to take Glipizide like she does.  She was also unwilling to discontinue insulin and just leave her mother on TLady Gary(as was suggested by her PCP). Since she is a nMarine scientist she stated she felt like her mother needed additional therapy.  .Marland KitchenShe was educated on where sulfonylureas are on the most recent ADA guideline recommendations-- (to be used with cost is an issue) o She was also educated on the cardiovascular findings.  Based on discussion with the patient's daughter and medication review:   .Marland KitchenDiscontinue insulin as previously suggested by the patient's provider . Start Glipizide 2.581m1 tablet daily due to the patient's renal function - Patient and her daughter were educated on hypoglycemia management--she has glucose gel and glucagon in her possession . Patient and daughter were knowledgeable on hypoglycemia management.  . -continue Tradjenta  . -consider decreasing statin to 3/week as patient's daughter reported it caused forgetfulness/memory issues  .  Check blood sugar in am, once  2 hours after a meal and at bedtime only to help with simplification  Medication Assistance Findings:  Extra Help:   [] Already receiving Full Extra Help  [] Already receiving Partial Extra Help  [] Eligible based on reported income and assets  [x] Not Eligible based on reported income and assets  Patient Assistance Programs: 1) Tradjenta made by [place] requirement met: [] Yes [] No [x] Unknown o Out-of-pocket prescription expenditure met:   [] Yes [] No  [] Unknown  [x] Not applicable Will  discuss with patient during our Friday phone call. (Wanted to get plan from provider before completing forms.)     Plan: Route note to patient's provider. Follow up with patient and her daughter in 1-2 business days.    Elayne Guerin, PharmD, [place] Clinical Pharmacist [phone]

## 2017-12-26 ENCOUNTER — Encounter: Payer: Self-pay | Admitting: Family

## 2017-12-26 ENCOUNTER — Telehealth: Payer: Self-pay

## 2017-12-26 NOTE — Telephone Encounter (Signed)
Copied from CRM [phone]. Topic: Quick Communication - See Telephone Encounter >> Dec 26, 2017 12:25 PM Waymon AmatoBurton, Donna F wrote: Pt daughter [name] called back and asked to speak to Delanna Blacketer to discuss the mom's lab work, she stated that she wanted to talk with Maralyn SagoSarah instead of any one else last time she called back she felt  The person giving the results was not of any help recommended mychart to help but pt stated she has trouble getting in but would try to call help desk tonight  Best number  [phone]

## 2017-12-27 ENCOUNTER — Encounter: Payer: Self-pay | Admitting: Family

## 2017-12-27 ENCOUNTER — Other Ambulatory Visit: Payer: Self-pay | Admitting: Pharmacist

## 2017-12-27 MED ORDER — GLIPIZIDE 5 MG PO TABS: 2.5000 mg | ORAL_TABLET | Freq: Every day | ORAL | 0 refills | Status: DC

## 2017-12-27 NOTE — Telephone Encounter (Signed)
[facility] an stated she sent over her notes from her home visit with pt on 12/25/17 and had not heard back yet. Please see notes on 12/25/17 for reference. Pt was happy with Katina's recommendations and would like to move forward. Please advise. CB# [phone]

## 2017-12-27 NOTE — Telephone Encounter (Signed)
I spoke with patient's daughter & she stated that she doesn't want to see endocrinology. She would actually prefer nephrology because she has not had good experiences with endo in the past. What patient's daughter requests is a dietician to come out to patient's home to work with her on a diabetic & renal diet.

## 2017-12-27 NOTE — Telephone Encounter (Signed)
Call Amy Calhoun back   I m  very uncomfortable with prescribing glipizide more than renally dosed amount.  I am sorry, worsening renal function can be irreversible.  rx's sent  Again, I would strongly encourage her to consider referral to endocrine because of her mom's fragility.

## 2017-12-27 NOTE — Addendum Note (Signed)
Addended by: Allegra GranaARNETT, Ceci Taliaferro G on: 12/27/2017 12:58 PM   Modules accepted: Orders

## 2017-12-27 NOTE — Telephone Encounter (Signed)
I spoke with patient's daughter & she fears that blood sugars will be too high on no insulin. She also needs prescription for glipizide 2.5mg , but thinks maybe patient should take two 2.5mg  doses a day? She wants one for before breakfast & one before dinner? She also wants [facility] & then future refills to mail order pharmacy in chart.

## 2017-12-27 NOTE — Telephone Encounter (Signed)
Call pharmacist and Kara MeadEmma   I had read note pharmacy  I do agree with Start Glipizide 2.5mg  1 tablet daily due to the patient's renal function.  She may continue the Tradjenta, and hold insulin as it was too burdensome for patient.     We will need to watch her renal function carefully, please ensure she has a follow-up scheduled with us

## 2017-12-27 NOTE — Telephone Encounter (Signed)
Please advise 

## 2017-12-27 NOTE — Telephone Encounter (Signed)
LMTCB

## 2017-12-27 NOTE — Patient Outreach (Addendum)
[facility]) Care Management  12/27/2017  Amy Calhoun CanadaMae Pall 06/28/1928 [id number]   Followed up with Claris CheMargaret Arnette's office in reference to starting Glipizide 2.5 mg daily and left a message. Called patient's daughter, [name]. HIPAA identifiers were obtained. Review of the patient's chart showed [name] called the provider's office as well. [name] stated that she did not think Glipizide 2.5mg  daily would be strong enough for her mother. She requested Glipizide 5mg . [name] was reminded about her mother's renal function and referred to the Glipizide package insert where the manufacturer's dosing guidelines are listed.     PCP office called back stating a new prescription for Glipizide 2.5mg  1 tablet daily would be sent to the patient's local pharmacy.  [name] also asked if there was anything she could do to improve her mother's kidney's function. She was advised to speak with her PCP and potentially look into being seen by a Nephrologist. [name] was against her mother seeing a Nephrologist but thought the idea of potentially seeing a dietician about dietary modifications that could be made for diabetes and kidneys was a good idea.  [name] wanted to know if she could give her mother a few units of Humulin R if her blood sugars increased >400.  She was advised against this and told to call her providers office but not attempt to give her mother insulin on top of dosing Glipizide due to risk of hypoglycemia.  [name] was advised to call her mother's PCP with elevated blood sugars.  Plan: Call patient back in 3-5 business days to check on how the glipizide is doing.  Beecher McardleKatina J. Zeferino Mounts, PharmD, BCACP [facility] Clinical Pharmacist [phone]

## 2017-12-30 ENCOUNTER — Encounter: Payer: Self-pay | Admitting: Family

## 2017-12-30 ENCOUNTER — Other Ambulatory Visit: Payer: Self-pay | Admitting: Family

## 2017-12-30 ENCOUNTER — Telehealth: Payer: Self-pay | Admitting: Pharmacist

## 2017-12-30 ENCOUNTER — Other Ambulatory Visit: Payer: Self-pay | Admitting: Sports Medicine

## 2017-12-30 DIAGNOSIS — N183 Chronic kidney disease, stage 3 unspecified: Secondary | ICD-10-CM

## 2017-12-30 NOTE — Telephone Encounter (Signed)
-----   Message from Allegra GranaMargaret G Arnett, FNP sent at 12/30/2017  3:54 PM EST ----- Agree with 2.5 mg glipizide QD and we will watch renal function.  I have also placed referrals to nephrology, nutrition.I am fine with nterval to 3 times a week from in regards to simvastatin.  Thanks! ----- Message ----- From: Beecher McardleBoyd, Yoshiko Keleher J, [facility] Sent: 12/25/2017   1:06 PM EST To: Allegra GranaMargaret G Arnett, FNP  Provider Arnett,  Thank you for the referral to see [name]. I conducted a home visit with the patient and her daughter this morning at her home.  [name]'s daughter, [name], would like to discontinue insulin and start her mother on Glipizide.  [name] is taking glipizide and despite being educated on the ADA guidelines, would still like to trial her mother on a sulfonylurea.  Due to her kidney function, Glipizide 2.5mg  1 tablet daily would be recommended.  In addition, [name] said she feels like her mother's memory is worse since restarting Simvastatin and wondered about stopping it.  As a compromise, would you consider increasing her dosing interval to 3 times per week?  Thank you again for your referral (full note is attached).  Blessings,   Beecher McardleKatina J. Theodosia Bahena, PharmD, [facility]ACP [facility] Clinical Pharmacist [phone]

## 2017-12-30 NOTE — Telephone Encounter (Signed)
See note  Referrals placed to nutrition and nephrology

## 2017-12-30 NOTE — Patient Outreach (Signed)
[facility]) Care Management  12/30/2017  Amy Calhoun Amy Calhoun 01/16/1928 [id number]   Patient's daughter  was called to review medication changes authorized by patient's PCP.  HIPAA identifiers were obtained. Patient's daughter [facility]. [facility] said the prescription had been filled at the mail order pharmacy on 12/27/17. Patient's daughter confirmed she spoke with the mail order pharmacy and that the medication is on the way.  Since the medication did not arrive over the weekend, patient and Amy Calhoun have decided to hold all medication changes until the beginning of the new year.  Plan: Call patient back after the new year. She has upcoming appointments with Endocrinology, a nutritionist and her PCP.   Amy McardleKatina J. Mikeala Calhoun, PharmD, BCACP [facility] Clinical Pharmacist [phone]

## 2018-01-01 ENCOUNTER — Ambulatory Visit: Payer: Medicare HMO | Admitting: Family

## 2018-01-01 ENCOUNTER — Encounter: Payer: Self-pay | Admitting: Family

## 2018-01-01 VITALS — BP 132/58 | HR 75 | Temp 97.1°F | Ht 63.0 in | Wt 154.4 lb

## 2018-01-01 DIAGNOSIS — N183 Chronic kidney disease, stage 3 unspecified: Secondary | ICD-10-CM

## 2018-01-01 DIAGNOSIS — I10 Essential (primary) hypertension: Secondary | ICD-10-CM | POA: Diagnosis not present

## 2018-01-01 DIAGNOSIS — E1142 Type 2 diabetes mellitus with diabetic polyneuropathy: Secondary | ICD-10-CM

## 2018-01-01 NOTE — Patient Instructions (Signed)
Start glipizide 2.5 mg once daily in the new year as we discussed today.  My hope is that Ms Amy Calhoun will be able to come off to pursue the regular mealtime insulin in time, which will be less burdensome.  Today we discussed referrals, orders. Nephrology , nutrition, home health   I have placed these orders in the system for you.  Please be sure to give Amy Calhoun a call if you have not heard from our office regarding this. We should hear from Amy Calhoun within ONE week with information regarding your appointment. If not, please let me know immediately.

## 2018-01-01 NOTE — Progress Notes (Signed)
Subjective:    Patient ID: Amy Calhoun, female    DOB: Jan 02, 1929, 84 y.o.   MRN: [id number]  CC: Amy Calhoun is a 84 y.o. female who presents today for follow up.   HPI: Accompanied by daughter, Amy Calhoun  No new concerns, overall feels well.  Here to discuss medications.  DM-  15units in the morning of NPH and 5 at night. This is decreased. Prior to this, had had episodes of fasting blood sugar in the 70s.  Continues to use 3  Units of Regular with meals.  Per patient, she will hold this medication if blood sugar is less than 200.  However daughter expresses concern that mother may not do this all the time.  She also expresses concern as mother skips meals occasionally. Has been on 70/30 and also lantus which didn't work.  She also has tried some of the newer insulins with endocrinology in the past.  These did not work well for patient.  Glipizide 2.5mg  QD coming from [facility].  Per daughter, plans to start glipizide in the new year.  HLD-compliant with simvastatin  CKD- daughter has removed all NSAIDs.   HTN-compliant with lisinopril 10mg  . No cp.              HISTORY:  Past Medical History:  Diagnosis Date  . Chronic kidney disease 08/07/2016   Chronic renal failure, stage III  . Depression   . Esophageal dysphagia   . Gastric ulcer   . GERD (gastroesophageal reflux disease)   . Hyperlipidemia   . Hypertension   . Hypothyroidism   . IDDM (insulin dependent diabetes mellitus) (HCC)   . Ischemic colitis (HCC)   . Osteoarthritis   . Ulcer of esophagus    Past Surgical History:  Procedure Laterality Date  . ABDOMINAL HYSTERECTOMY    . COLONOSCOPY  2010  . ESOPHAGEAL DILATION  08/07/2016   usually a couple times a year, last time 07/30/16  . GALLBLADDER SURGERY    . LUMBAR LAMINECTOMY/DECOMPRESSION MICRODISCECTOMY N/A 08/10/2016   Procedure: BILATERAL HEMILAMINECTOMY LUMBAR THREE-FOUR,LUMBAR FOUR-FIVE,LEFT LUMBAR FIVE-SACRAL ONE HEMILAMINECTOMY AND  DECOMPRESSION;  Surgeon: Coletta Memos, MD;  Location: [facility] OR;  Service: Neurosurgery;  Laterality: N/A;  . THYROIDECTOMY     Family History  Problem Relation Age of Onset  . Diabetes Mother   . Arthritis Mother   . Hyperlipidemia Mother   . Mental illness Mother   . Heart disease Father   . Mental illness Sister   . Arthritis Maternal Grandmother   . Arthritis Maternal Grandfather   . Colon cancer Neg Hx   . Stomach cancer Neg Hx   . Esophageal cancer Neg Hx     Allergies: Pravachol [pravastatin] Current Outpatient Medications on File Prior to Visit  Medication Sig Dispense Refill  . acetaminophen-codeine (TYLENOL #3) 300-30 MG tablet TAKE 1 TABLET BY MOUTH DAILY AS NEEDED FOR MODERATE PAIN. 30 tablet 1  . acidophilus (RISAQUAD) CAPS capsule Take 1 capsule by mouth daily.    Marland Kitchen albuterol (PROVENTIL HFA;VENTOLIN HFA) 108 (90 Base) MCG/ACT inhaler Inhale 2 puffs into the lungs every 6 (six) hours as needed for wheezing or shortness of breath. 1 Inhaler 2  . amitriptyline (ELAVIL) 75 MG tablet TAKE 2 TABLETS AT BEDTIME 180 tablet 4  . BD INSULIN SYRINGE U/F 31G X 5/16" 1 ML MISC USE AS DIRECTED TO ADMINISTER INSULIN 100 each 5  . Cholecalciferol (VITAMIN D) 2000 units tablet Take by mouth.    . Coenzyme Q10 (  CO Q 10) 100 MG CAPS Take 1 capsule by mouth daily.    Marland Kitchen. CRANBERRY PO Take 1 tablet by mouth every Monday, Wednesday, and Friday.    . docusate sodium (COLACE) 100 MG capsule Take 1 capsule (100 mg total) by mouth 2 (two) times daily. (Patient taking differently: Take 100 mg by mouth every other day. ) 10 capsule 0  . gabapentin (NEURONTIN) 100 MG capsule TAKE 2 CAPSULES THREE TIMES A DAY 540 capsule 1  . glipiZIDE (GLUCOTROL) 5 MG tablet Take 0.5 tablets (2.5 mg total) by mouth daily before breakfast. 30 tablet 0  . glucagon (GLUCAGON EMERGENCY) 1 MG injection Inject 1 mg into the vein once as needed. For hypoglycemic episodes. E11.42 1 each 3  . glucose blood test strip Use to test  blood sugar up to 5 times daily. 200 each 12  . insulin NPH Human (HUMULIN N) 100 UNIT/ML injection INJECT 20 UNITS EVERY MORNING AND 10 UNITS AT NIGHT (Patient taking differently: INJECT 15 UNITS EVERY MORNING AND 5 UNITS AT NIGHT) 30 mL 2  . insulin regular (HUMULIN R) 100 units/mL injection INJECT 3 UNITS BEFORE BREAKFAST, 6 UNITS BEFORE LUNCH AND 6 UNITS BEFORE DINNER (Patient taking differently: INJECT 3 UNITS BEFORE BREAKFAST, 3 UNITS BEFORE LUNCH AND 3 UNITS BEFORE DINNER) 30 mL 2  . levothyroxine (SYNTHROID, LEVOTHROID) 150 MCG tablet TAKE 1 TABLET DAILY 90 tablet 4  . lidocaine (LIDODERM) 5 % PLACE 1 PATCH ON THE SKIN DAILY 90 patch 1  . linagliptin (TRADJENTA) 5 MG TABS tablet Take 1 tablet (5 mg total) by mouth daily. 90 tablet 1  . lisinopril (PRINIVIL,ZESTRIL) 10 MG tablet Take 1 tablet (10 mg total) by mouth daily. 90 tablet 1  . omeprazole (PRILOSEC) 40 MG capsule TAKE 1 CAPSULE DAILY 90 capsule 3  . pyridOXINE (VITAMIN B-6) 100 MG tablet Take 100 mg by mouth daily.    . riboflavin (VITAMIN B-2) 100 MG TABS tablet Take 100 mg by mouth daily.    Marland Kitchen. senna-docusate (SENOKOT-S) 8.6-50 MG tablet Take 1 tablet by mouth at bedtime. 30 tablet 5  . simvastatin (ZOCOR) 40 MG tablet Take 1 tablet (40 mg total) by mouth at bedtime. 90 tablet 1  . Turmeric 500 MG TABS Take 750 mg by mouth 2 (two) times daily.    . vitamin B-12 (CYANOCOBALAMIN) 500 MCG tablet Take 500 mcg by mouth daily.     Current Facility-Administered Medications on File Prior to Visit  Medication Dose Route Frequency Provider Last Rate Last Dose  . 0.9 %  sodium chloride infusion  500 mL Intravenous Once Rachael FeeJacobs, Daniel P, MD        Social History   Tobacco Use  . Smoking status: Never Smoker  . Smokeless tobacco: Never Used  Substance Use Topics  . Alcohol use: No  . Drug use: No    Review of Systems  Constitutional: Negative for chills and fever.  Respiratory: Negative for cough.   Cardiovascular: Negative for  chest pain and palpitations.  Gastrointestinal: Negative for nausea and vomiting.      Objective:    BP (!) 132/58 (BP Location: Left Arm, Patient Position: Sitting, Cuff Size: Normal)   Pulse 75   Temp (!) 97.1 F (36.2 C)   Ht 5\' 3"  (1.6 m)   Wt 154 lb 6.4 oz (70 kg)   SpO2 98%   BMI 27.35 kg/m  BP Readings from Last 3 Encounters:  01/01/18 (!) 132/58  11/22/17 (!) 150/52  10/14/17  140/70   Wt Readings from Last 3 Encounters:  01/01/18 154 lb 6.4 oz (70 kg)  11/22/17 157 lb 3.2 oz (71.3 kg)  10/14/17 157 lb 6 oz (71.4 kg)    Physical Exam Vitals signs reviewed.  Constitutional:      Appearance: She is well-developed.  Eyes:     Conjunctiva/sclera: Conjunctivae normal.  Cardiovascular:     Rate and Rhythm: Normal rate and regular rhythm.     Pulses: Normal pulses.     Heart sounds: Normal heart sounds.  Pulmonary:     Effort: Pulmonary effort is normal.     Breath sounds: Normal breath sounds. No wheezing, rhonchi or rales.  Skin:    General: Skin is warm and dry.  Neurological:     Mental Status: She is alert.  Psychiatric:        Speech: Speech normal.        Behavior: Behavior normal.        Thought Content: Thought content normal.        Assessment & Plan:   Problem List Items Addressed This Visit      Cardiovascular and Mediastinum   Essential hypertension    Based on creatinine clearance, discussed decreasing lisinopril to 2.5mg  QD  Daughter declines this at this time and she would like to see nephrology first.  Will follow        Endocrine   DM type 2 with diabetic peripheral neuropathy (HCC) - Primary    Slightly uncontrolled.  Discussed with patient that I would comfortable with fasting blood sugars in the 1 50-200 range based on age, fragility.   Our goal would be for patient to come off of meal dosing insulin, perhaps using a long-acting insulin with glipizide at 2.5 mg once a day to reduce burden of insulin.  Daughter now shares concern for  hypoglycemia.   We will await any recommendations from nephrology.  Politely declines referral to endocrine at this time.  Close follow-up.      Relevant Orders   Ambulatory referral to Home Health     Genitourinary   CKD (chronic kidney disease) stage 3, GFR 30-59 ml/min (HCC)    CrtCl 30.  Patient and mother agreeable to consult with nephrology.  Will follow.          I have discontinued Mindee M. Talamante's Fish Oil. I am also having her maintain her pyridOXINE, riboflavin, glucagon, docusate sodium, senna-docusate, lidocaine, acidophilus, CRANBERRY PO, Co Q 10, albuterol, glucose blood, Vitamin D, Turmeric, lisinopril, omeprazole, BD INSULIN SYRINGE U/F, linagliptin, insulin NPH Human, insulin regular, simvastatin, amitriptyline, acetaminophen-codeine, glipiZIDE, gabapentin, levothyroxine, and vitamin B-12. We will continue to administer sodium chloride.   No orders of the defined types were placed in this encounter.   Return precautions given.   Risks, benefits, and alternatives of the medications and treatment plan prescribed today were discussed, and patient expressed understanding.   Education regarding symptom management and diagnosis given to patient on AVS.  Continue to follow with Allegra Grana, FNP for routine health maintenance.   Keashia Mae [facility] and I agreed with plan.   Rennie Plowman, FNP

## 2018-01-02 ENCOUNTER — Ambulatory Visit: Payer: Medicare HMO | Admitting: Pharmacist

## 2018-01-03 NOTE — Assessment & Plan Note (Signed)
Based on creatinine clearance, discussed decreasing lisinopril to 2.5mg  QD  Daughter declines this at this time and she would like to see nephrology first.  Will follow

## 2018-01-03 NOTE — Assessment & Plan Note (Addendum)
CrtCl 30.  Patient and mother agreeable to consult with nephrology.  Will follow.

## 2018-01-03 NOTE — Assessment & Plan Note (Signed)
Slightly uncontrolled.  Discussed with patient that I would comfortable with fasting blood sugars in the 1 50-200 range based on age, fragility.   Our goal would be for patient to come off of meal dosing insulin, perhaps using a long-acting insulin with glipizide at 2.5 mg once a day to reduce burden of insulin.  Daughter now shares concern for hypoglycemia.   We will await any recommendations from nephrology.  Politely declines referral to endocrine at this time.  Close follow-up.

## 2018-01-06 ENCOUNTER — Telehealth: Payer: Self-pay | Admitting: *Deleted

## 2018-01-06 DIAGNOSIS — E1142 Type 2 diabetes mellitus with diabetic polyneuropathy: Secondary | ICD-10-CM

## 2018-01-06 NOTE — Telephone Encounter (Signed)
Copied from CRM [id number]. Topic: Referral - Request for Referral >> Jan 06, 2018 10:19 AM Herby AbrahamJohnson, Shiquita C wrote: Pt's daughter called in to [facility] Dr. Carolynn CommentKelly [place]. Pt's daughter says that she use to work at a kidney center and is familiar with this office.   Daughter would like to have referral address at [facility].

## 2018-01-06 NOTE — Telephone Encounter (Signed)
Please advise on referral

## 2018-01-08 ENCOUNTER — Other Ambulatory Visit: Payer: Self-pay | Admitting: Family

## 2018-01-09 MED ORDER — GLIPIZIDE 5 MG PO TABS: 2.5000 mg | ORAL_TABLET | Freq: Every day | ORAL | 1 refills | Status: DC

## 2018-01-09 NOTE — Addendum Note (Signed)
Addended by: Elise BenneBOOTH, Noora Locascio T on: 01/09/2018 09:38 AM   Modules accepted: Orders

## 2018-01-10 NOTE — Telephone Encounter (Signed)
Melissa,  I placed NEW referral to nephrology per daughters request below  Let me know if any questions

## 2018-01-13 ENCOUNTER — Ambulatory Visit: Payer: Medicare HMO | Admitting: Family

## 2018-01-20 ENCOUNTER — Encounter: Payer: Self-pay | Admitting: Family

## 2018-01-21 ENCOUNTER — Telehealth: Payer: Self-pay | Admitting: *Deleted

## 2018-01-21 NOTE — Telephone Encounter (Signed)
Copied from CRM [id number]. Topic: Referral - Question >> Jan 21, 2018  1:49 PM Terisa Starr wrote: Reason for CRM: [name], patients daughter said that [place] with [facility] called about getting her started with the dietician there. She said she doesn't understand why they are calling because she asked for a home health agency to come out to help with this. She would like a call back @ [phone]

## 2018-01-22 DIAGNOSIS — R69 Illness, unspecified: Secondary | ICD-10-CM | POA: Diagnosis not present

## 2018-01-22 NOTE — Telephone Encounter (Signed)
I spoke with patient's daughter & she stated that she was still unsure why a dietician wasn't included in referral with home health.She said that she would take her mom to the lifestyle center if she has to, but she is confused why home health can't take care of this?  She is also concerned because nephrology referral was placed & they called her from [facility] in [place]. Patient wants to be seen by Dr. Kathrene Bongo at [facility] in [place]. Can you help with this Melissa?

## 2018-01-23 ENCOUNTER — Other Ambulatory Visit: Payer: Self-pay | Admitting: Pharmacist

## 2018-01-23 NOTE — Telephone Encounter (Signed)
Spoke to [place]. Informed her that referral was sent to Dr. Kathrene Bongo.

## 2018-01-23 NOTE — Patient Outreach (Addendum)
[facility]) Care Management  01/23/2018  Amy Calhoun 10-Mar-1928 [id number]   Patient was called regarding medication management. Spoke with her daughter, Amy Calhoun. Amy Calhoun confirmed they have not started glipizide and discontinued insuliin as was requested (by Amy Calhoun) and discussed over a month ago.  She said they were waiting on her nephrology appointment and nutritionist's appointment and those had not been worked out yet.  Plan: Close patient's case as she has not started the suggested therapy and will be going to see specialists.  Her HgA1c is within normal limits for her age and she is being followed closely by her PCP.  [facility] Pharmacy Resident, Catie Feliz Beam, practices in the PCP office. A note will be routed to her to see if she would like to continue the follow up of this patient.  Patient's daughter is a Engineer, civil (consulting) and has my phone number for future questions and concerns should they arise.   Beecher Mcardle, PharmD, BCACP [facility] Clinical Pharmacist [phone]

## 2018-01-28 NOTE — Telephone Encounter (Signed)
Amy Calhoun [facility]) [phone]   pts daughter, [name], called to f/u on referrals. I advised her that referral has been sent to [facility] in [place] and they should contact her. Offered to provide phone # and she stated she didn't have anything to write it down. She'll call if not heard from w/in 1 week. She is asking if referral for dietician has been sent and when to expect this. Please advise.

## 2018-01-31 ENCOUNTER — Ambulatory Visit: Payer: Medicare HMO | Admitting: Family

## 2018-01-31 NOTE — Telephone Encounter (Signed)
Call [facility], Amy Calhoun at [phone] or [phone] explain him what we need here primarily nutrition education for elderly patient who is homebound with diabetes.  Can [facility] provide?  Please then call the daughter and let her know that we have NOT forgotten about her, we are having trouble figuring out why we cannot do a home health referral with the nutrition consult.    We would Like very much for her to be able to have nutrition services in her home.  If that is not possible, yes we will recommend a nutrition consult outside the home.

## 2018-01-31 NOTE — Telephone Encounter (Signed)
LMTCB transfer [name] to office.

## 2018-02-03 NOTE — Telephone Encounter (Signed)
I spoke with patient's daughter [name] to let her know that referral for [facility] was being worked on. She stated that she never heard from any regarding nephrology referral that was placed 01/10/18. Could this be looked into or should I ask patient to call their office?

## 2018-02-03 NOTE — Telephone Encounter (Signed)
Let daughter know that we will work on [place]; Efraim Kaufmann is contacting them Let us know if she doesn't hear from [place] in a few days

## 2018-02-05 NOTE — Telephone Encounter (Signed)
The referral was sent to Dr. Kathrene Bongo per the daughters request Att: Annabelle Harman. I sent it to them on 12/19. They still have it in review. She can call their office to find out about the status. They have not noted anything in her chart regarding this.

## 2018-02-05 NOTE — Telephone Encounter (Signed)
Patient's daughter [name] called to ask when she could have labs repeated to check kidney function since they have not been able to see Dr. Kathrene Bongo yet? Please advise

## 2018-02-05 NOTE — Telephone Encounter (Signed)
I spoke with patient's daughter [name] & gave her update. She will call Dr. Kathrene Bongo.

## 2018-02-09 ENCOUNTER — Encounter: Payer: Self-pay | Admitting: Sports Medicine

## 2018-02-13 ENCOUNTER — Telehealth: Payer: Self-pay | Admitting: *Deleted

## 2018-02-13 ENCOUNTER — Other Ambulatory Visit: Payer: Self-pay | Admitting: Family

## 2018-02-13 DIAGNOSIS — E1142 Type 2 diabetes mellitus with diabetic polyneuropathy: Secondary | ICD-10-CM

## 2018-02-13 NOTE — Telephone Encounter (Signed)
Referral to nutrition services has been placed

## 2018-02-13 NOTE — Telephone Encounter (Signed)
Copied from CRM [id number]. Topic: General - Other >> Feb 13, 2018 12:11 PM Leafy Ro wrote: Reason for CRM: pt daughter is calling and [facility] Contes is not in her mother network for home health. Pt daughter verdin just would like her mother to be referred back to [facility] for dietician and diabetes/renal consults

## 2018-02-14 NOTE — Telephone Encounter (Signed)
Patient's daughter [name] was notified referral was placed.

## 2018-02-20 ENCOUNTER — Other Ambulatory Visit: Payer: Self-pay | Admitting: Family

## 2018-02-20 DIAGNOSIS — M1612 Unilateral primary osteoarthritis, left hip: Secondary | ICD-10-CM

## 2018-02-20 NOTE — Telephone Encounter (Signed)
Requested medication (s) are due for refill today - yes  Requested medication (s) are on the active medication list -yes  Future visit scheduled -yes  Last refill: 12/16/17 1 RF  Notes to clinic: Patient is requesting RF of non delegated Rx- sent for PCP review   Requested Prescriptions  Pending Prescriptions Disp Refills   acetaminophen-codeine (TYLENOL #3) 300-30 MG tablet 30 tablet 1    Sig: Take 1 tablet by mouth daily as needed for moderate pain.     Not Delegated - Analgesics:  Opioid Agonist Combinations Failed - 02/20/2018 11:34 AM      Failed - This refill cannot be delegated      Failed - Urine Drug Screen completed in last 360 days.      Passed - Valid encounter within last 6 months    Recent Outpatient Visits          1 month ago DM type 2 with diabetic peripheral neuropathy [facility])   [place] [facility] [place], Lyn Records, FNP   3 months ago Primary osteoarthritis of left hip   [facility] [place], Casimiro Needle D, DO   4 months ago Hyperlipidemia, unspecified hyperlipidemia type   [facility] [place], Lyn Records, FNP   7 months ago Lower abdominal pain   [facility] Allegra Grana, FNP   11 months ago DM type 2 with diabetic peripheral neuropathy [facility])   [facility] [place], Lyn Records, FNP      Future Appointments            In 2 weeks O'Brien-Blaney, Denisa L, LPN [facility] [place], [facility]            Requested Prescriptions  Pending Prescriptions Disp Refills   acetaminophen-codeine (TYLENOL #3) 300-30 MG tablet 30 tablet 1    Sig: Take 1 tablet by mouth daily as needed for moderate pain.     Not Delegated - Analgesics:  Opioid Agonist Combinations Failed - 02/20/2018 11:34 AM      Failed - This refill cannot be delegated      Failed - Urine Drug Screen completed in last 360 days.      Passed - Valid encounter within last 6 months    Recent Outpatient  Visits          1 month ago DM type 2 with diabetic peripheral neuropathy [facility])   [facility] [place], Lyn Records, FNP   3 months ago Primary osteoarthritis of left hip   [facility] [place], Casimiro Needle D, DO   4 months ago Hyperlipidemia, unspecified hyperlipidemia type   [facility] [place], Lyn Records, FNP   7 months ago Lower abdominal pain   [facility] Allegra Grana, FNP   11 months ago DM type 2 with diabetic peripheral neuropathy [facility])   [facility] [place], Lyn Records, FNP      Future Appointments            In 2 weeks O'Brien-Blaney, Denisa L, LPN  [facility] [place], [facility]

## 2018-02-20 NOTE — Telephone Encounter (Signed)
Copied from CRM [id number]. Topic: Quick Communication - Rx Refill/Question >> Feb 20, 2018 11:29 AM Jolayne Haines L wrote: Medication: acetaminophen-codeine (TYLENOL #3) 300-30 MG tablet  Has the patient contacted their pharmacy? Yes her daughter called on Monday (Agent: If no, request that the patient contact the pharmacy for the refill.) (Agent: If yes, when and what did the pharmacy advise?)  Preferred Pharmacy (with phone number or street name): [phone] [place], [place] - [address] [place] [address]   Agent: Please be advised that RX refills may take up to 3 business days. We ask that you follow-up with your pharmacy.

## 2018-02-21 MED ORDER — ACETAMINOPHEN-CODEINE #3 300-30 MG PO TABS: 1.0000 | ORAL_TABLET | Freq: Every day | ORAL | 1 refills | Status: DC | PRN

## 2018-02-21 NOTE — Telephone Encounter (Signed)
I looked up patient on [place] Controlled Substances Reporting System and saw no activity that raised concern of inappropriate use.   

## 2018-03-05 ENCOUNTER — Other Ambulatory Visit: Payer: Self-pay | Admitting: Family

## 2018-03-06 ENCOUNTER — Other Ambulatory Visit: Payer: Self-pay | Admitting: Internal Medicine

## 2018-03-06 DIAGNOSIS — E1142 Type 2 diabetes mellitus with diabetic polyneuropathy: Secondary | ICD-10-CM

## 2018-03-07 ENCOUNTER — Ambulatory Visit: Payer: Medicare HMO

## 2018-03-11 ENCOUNTER — Telehealth: Payer: Self-pay | Admitting: Family

## 2018-03-11 NOTE — Telephone Encounter (Signed)
Copied from CRM [id number]. Topic: Quick Communication - Rx Refill/Question >> Mar 11, 2018  2:22 PM [place], [place] D wrote: Medication: glucose blood test strip [employer] called requesting PA for test strips. Please advise. CB# / [phone]   Has the patient contacted their pharmacy? Yes (Agent: If no, request that the patient contact the pharmacy for the refill.) (Agent: If yes, when and what did the pharmacy advise?)  Preferred Pharmacy (with phone number or street name[facility] [phone] [place], [place] - [address] [phone] (Phone) [phone] (Fax)  Agent: Please be advised that RX refills may take up to 3 business days. We ask that you follow-up with your pharmacy.

## 2018-03-12 ENCOUNTER — Other Ambulatory Visit: Payer: Self-pay

## 2018-03-12 DIAGNOSIS — R69 Illness, unspecified: Secondary | ICD-10-CM | POA: Diagnosis not present

## 2018-03-12 MED ORDER — GLUCOSE BLOOD VI STRP: ORAL_STRIP | 12 refills | Status: DC

## 2018-03-12 NOTE — Telephone Encounter (Signed)
I have called patient's daughter [name] & let know that PA was approved 03/12/18-03/13/19 for verio test trips. She is able to test up to 5 times a day. [facility]

## 2018-03-14 ENCOUNTER — Telehealth: Payer: Self-pay

## 2018-03-14 NOTE — Telephone Encounter (Signed)
Copied from CRM [id number]. Topic: General - Other >> Mar 14, 2018  2:52 PM Marylen Ponto wrote: Reason for CRM: Marcelino Duster with [facility] requests progress notes from patient last office visit faxed to [phone]

## 2018-03-14 NOTE — Telephone Encounter (Signed)
Last OV notes faxed

## 2018-03-17 ENCOUNTER — Other Ambulatory Visit: Payer: Self-pay

## 2018-03-17 ENCOUNTER — Telehealth: Payer: Self-pay | Admitting: Family

## 2018-03-17 MED ORDER — ONETOUCH VERIO W/DEVICE KIT: 1.0000 | PACK | Freq: Every day | 0 refills | Status: DC

## 2018-03-17 NOTE — Telephone Encounter (Signed)
Copied from CRM [id number]. Topic: Quick Communication - Rx Refill/Question >> Mar 17, 2018  3:40 PM Arlyss Gandy, NT wrote: Medication: One Touch Verio Meter  Pts broke this weekend  Has the patient contacted their pharmacy? Yes.   (Agent: If no, request that the patient contact the pharmacy for the refill.) (Agent: If yes, when and what did the pharmacy advise?)  Preferred Pharmacy (with phone number or street name[facility] [phone] [place], [place] - [address] [phone] (Phone) [phone] (Fax)    Agent: Please be advised that RX refills may take up to 3 business days. We ask that you follow-up with your pharmacy.

## 2018-03-17 NOTE — Telephone Encounter (Signed)
I spoke with patient's daughter to let her know that [facility].

## 2018-03-18 DIAGNOSIS — R69 Illness, unspecified: Secondary | ICD-10-CM | POA: Diagnosis not present

## 2018-03-24 DIAGNOSIS — E1142 Type 2 diabetes mellitus with diabetic polyneuropathy: Secondary | ICD-10-CM | POA: Diagnosis not present

## 2018-03-24 DIAGNOSIS — D631 Anemia in chronic kidney disease: Secondary | ICD-10-CM | POA: Diagnosis not present

## 2018-03-24 DIAGNOSIS — E1122 Type 2 diabetes mellitus with diabetic chronic kidney disease: Secondary | ICD-10-CM | POA: Diagnosis not present

## 2018-03-24 DIAGNOSIS — N390 Urinary tract infection, site not specified: Secondary | ICD-10-CM | POA: Diagnosis not present

## 2018-03-24 DIAGNOSIS — I129 Hypertensive chronic kidney disease with stage 1 through stage 4 chronic kidney disease, or unspecified chronic kidney disease: Secondary | ICD-10-CM | POA: Diagnosis not present

## 2018-03-24 DIAGNOSIS — N183 Chronic kidney disease, stage 3 (moderate): Secondary | ICD-10-CM | POA: Diagnosis not present

## 2018-03-27 ENCOUNTER — Ambulatory Visit: Payer: Medicare HMO | Admitting: Dietician

## 2018-04-02 ENCOUNTER — Other Ambulatory Visit: Payer: Self-pay

## 2018-04-02 ENCOUNTER — Ambulatory Visit: Payer: Medicare HMO | Admitting: Family

## 2018-04-02 ENCOUNTER — Encounter: Payer: Self-pay | Admitting: Dietician

## 2018-04-02 ENCOUNTER — Encounter: Payer: Medicare HMO | Attending: Family | Admitting: Dietician

## 2018-04-02 VITALS — Ht 63.5 in | Wt 151.6 lb

## 2018-04-02 DIAGNOSIS — E785 Hyperlipidemia, unspecified: Secondary | ICD-10-CM | POA: Diagnosis not present

## 2018-04-02 DIAGNOSIS — E039 Hypothyroidism, unspecified: Secondary | ICD-10-CM | POA: Diagnosis not present

## 2018-04-02 DIAGNOSIS — Z794 Long term (current) use of insulin: Secondary | ICD-10-CM | POA: Diagnosis not present

## 2018-04-02 DIAGNOSIS — E1142 Type 2 diabetes mellitus with diabetic polyneuropathy: Secondary | ICD-10-CM | POA: Insufficient documentation

## 2018-04-02 NOTE — Progress Notes (Addendum)
Medical Nutrition Therapy: Visit start time: 1330  end time: 1430  Assessment:  Diagnosis: diabetes (25yr history) Past medical history: hyperlipidemia, hypothyroidism Psychosocial issues/ stress concerns: none  Preferred learning method:  . Auditory . Visual . Hands-on   Current weight: 151.6lbs Height: 5'3.5"  Medications, supplements:   reconciled list in medical record  Patient is taking insulin: NPH 15 units daily in am and 5 units with supper meal; Regular 3 units with each meal, decreasing to 0 units if BG is less than 200mg /dl.   Progress and evaluation:   Patient accompanied by daughter at visit. They report patient's long history of diabetes, has been taking insulin since soon after diagnosis.   She had a surgical procedure recently, after which renal function declined, but has now improved again; reports recent GFR in 50s.   Patient is checking BGs 4 times daily, with fasting BGs ranging 88-328 and pre/post-meal BGs ranging 70-327 in the past month. She does keep orange juice on hand in case of low BGs.   Patient is often having to decrease her mealtime insulin at her evening meal to avoid nighttime hypoglycemia.   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   Patient has trouble chewing hard foods and some meats due to poor dentition.  Physical activity: pool exercise, walking 30-60 minutes, 6 times a week  Dietary Intake:  Usual eating pattern includes 3 meals and 2-3 snacks per day. Dining out frequency: 1-2 meals per week.  Breakfast: 2 slices toast; or 2 pkgs low-sugar oatmeal; or crepes with sugar free syrup Snack: same as pm Lunch: meal provided at residence, meat + starch + veg + small dessert Snack: yogurt, or Premier drink, or fruit, or chocolate Supper: salad (from salad bar at residence); or snack such as cottage cheese with fruit Snack: same as pm; likes graham crackers dipped in milk Beverages: water, milk, 1c coffee in am  with sugar free creamer and 1pkt equal  Nutrition Care Education: Topics covered: diabetes, renal health Basic nutrition: basic food groups, appropriate nutrient balance, appropriate meal and snack schedule, general nutrition guidelines    Weight control: maintaining healthy weight by including moderate amounts of (mostly healthy) fats and protein ie in peanut butter, cheese Diabetes: appropriate meal and snack schedule, appropriate carb intake and balance, appropriate protein intake and easy-to-chew options, and role of protein in BG control, importance of preventing low BGs due to hypoglycemia unawareness, role of exercise in BG control Renal health:  appropriate protein intake, controlling sodium intake, importance of controlling BP and factors that help in BP control such as exercise and focus on vegetable and fruit intake.   Nutritional Diagnosis:  [place]-2.2 Altered nutrition-related laboratory As related to diabetes.  As evidenced by patient with widely fluctuating BGs on record and most recent HbA1C of 7.8%.  Intervention:   Instruction and discussion as noted above.  Pattern of small meals and several daily snacks works best for patient.   Set goals with specific guidance for achieving best nutritional balance in meals and snacks.   No follow-up scheduled at this time due to coronavirus crisis; patient or daughter will schedule at a later time if needed. Encouraged patient to call if any questions or concerns arise.  Next medical appointment is 04/04/18.   Education Materials given:  . Plate Planner with food lists . Sample menus . Goals/ instructions   Learner/ who was taught:  . Patient  . Family member: daughter Amy Calhoun   Level of understanding: .  Verbalizes/ demonstrates competency   Demonstrated degree of understanding via:   Teach back Learning barriers: . None   Willingness to learn/ readiness for change: . Eager, change in progress   Monitoring and  Evaluation:  Dietary intake, exercise, BG control, and body weight      follow up: prn

## 2018-04-02 NOTE — Patient Instructions (Signed)
   Include some cheese or peanut butter on toast at breakfast, or add 1/2 a protein drink with oatmeal or crepes. Eggs are also good protein if available.  Make sure to eat some meat and/or beans at lunch for protein.   Include cheese, meat, or boiled eggs on salads, or eat with peanut butter or cheese crackers.   Protein drink 1/2 or a whole one is a good choice at night. A small portion of starch or fruit is also good, like some graham crackers, vanilla wafers, small fruit cup of peaches or fruit cocktail, banana with a little peanut butter, etc.

## 2018-04-04 ENCOUNTER — Other Ambulatory Visit: Payer: Self-pay

## 2018-04-04 ENCOUNTER — Ambulatory Visit (INDEPENDENT_AMBULATORY_CARE_PROVIDER_SITE_OTHER): Payer: Medicare HMO | Admitting: Family

## 2018-04-04 ENCOUNTER — Encounter: Payer: Self-pay | Admitting: Family

## 2018-04-04 VITALS — BP 122/58 | HR 74 | Temp 97.7°F | Wt 151.0 lb

## 2018-04-04 DIAGNOSIS — I10 Essential (primary) hypertension: Secondary | ICD-10-CM

## 2018-04-04 DIAGNOSIS — E1142 Type 2 diabetes mellitus with diabetic polyneuropathy: Secondary | ICD-10-CM | POA: Diagnosis not present

## 2018-04-04 DIAGNOSIS — E785 Hyperlipidemia, unspecified: Secondary | ICD-10-CM

## 2018-04-04 DIAGNOSIS — N183 Chronic kidney disease, stage 3 unspecified: Secondary | ICD-10-CM

## 2018-04-04 LAB — HEMOGLOBIN A1C: Hgb A1c MFr Bld: 7.9 % — ABNORMAL HIGH (ref 4.6–6.5)

## 2018-04-04 LAB — COMPREHENSIVE METABOLIC PANEL
ALT: 15 U/L (ref 0–35)
AST: 15 U/L (ref 0–37)
Albumin: 4.1 g/dL (ref 3.5–5.2)
Alkaline Phosphatase: 66 U/L (ref 39–117)
BUN: 22 mg/dL (ref 6–23)
CO2: 28 meq/L (ref 19–32)
Calcium: 8.8 mg/dL (ref 8.4–10.5)
Chloride: 103 mEq/L (ref 96–112)
Creatinine, Ser: 1.12 mg/dL (ref 0.40–1.20)
GFR: 45.96 mL/min — AB (ref >60.00)
Glucose, Bld: 131 mg/dL — ABNORMAL HIGH (ref 70–99)
Potassium: 4.7 mEq/L (ref 3.5–5.1)
Sodium: 139 mEq/L (ref 135–145)
Total Bilirubin: 0.4 mg/dL (ref 0.2–1.2)
Total Protein: 6.6 g/dL (ref 6.0–8.3)

## 2018-04-04 LAB — LIPID PANEL
Cholesterol: 146 mg/dL (ref 0–200)
HDL: 56.2 mg/dL (ref >39.00)
LDL Cholesterol: 71 mg/dL (ref 0–99)
NonHDL: 89.81
Total CHOL/HDL Ratio: 3
Triglycerides: 93 mg/dL (ref 0.0–149.0)
VLDL: 18.6 mg/dL (ref 0.0–40.0)

## 2018-04-04 MED ORDER — INSULIN NPH (HUMAN) (ISOPHANE) 100 UNIT/ML ~~LOC~~ SUSP: SUBCUTANEOUS | 4 refills | Status: DC

## 2018-04-04 MED ORDER — INSULIN NPH (HUMAN) (ISOPHANE) 100 UNIT/ML ~~LOC~~ SUSP: SUBCUTANEOUS | 0 refills | Status: DC

## 2018-04-04 NOTE — Patient Instructions (Addendum)
Will await recommendations from endocrine   Let me know if you need anything at all.

## 2018-04-04 NOTE — Assessment & Plan Note (Signed)
Established with nephrology, will follow

## 2018-04-04 NOTE — Assessment & Plan Note (Signed)
No hypoglycemic episodes.  Pending A1c today.  Patient to establish with endocrine, will follow

## 2018-04-04 NOTE — Progress Notes (Signed)
Subjective:    Patient ID: Amy Calhoun, female    DOB: 1928-10-07, 85 y.o.   MRN: [id number]  CC: Amy Calhoun is a 85 y.o. female who presents today for follow up.   HPI: Accompanied by daughter, [name] well today, no complaints  Dm- checking blood glucose 4-5 times per day. Giving insulin 5 times per day. 15 units in the morning NPH and 5 at night. 3 units regular insulin with meals. No hypoglycemic episodes.   Not on glipizide.   Has seen DM nutritionist.  Pursuing Libre  CKD- Has [facility], Dr Moshe Cipro 03/2018. GFR improved to 50's per daughter.  Referral to endocrine ( appt later this month).   Not on NSAIDs.   HTN- compliant with lisinopril. No CP.   HLD- on zocor three times per week.    Recently treated with ciprofloxacin for uti.  No dysuria, urinary frequency      HISTORY:  Past Medical History:  Diagnosis Date  . Chronic kidney disease 08/07/2016   Chronic renal failure, stage III  . Depression   . Esophageal dysphagia   . Gastric ulcer   . GERD (gastroesophageal reflux disease)   . Hyperlipidemia   . Hypertension   . Hypothyroidism   . IDDM (insulin dependent diabetes mellitus) ([place])   . Ischemic colitis ([place])   . Osteoarthritis   . Ulcer of esophagus    Past Surgical History:  Procedure Laterality Date  . ABDOMINAL HYSTERECTOMY    . COLONOSCOPY  2010  . ESOPHAGEAL DILATION  08/07/2016   usually a couple times a year, last time 07/30/16  . GALLBLADDER SURGERY    . LUMBAR LAMINECTOMY/DECOMPRESSION MICRODISCECTOMY N/A 08/10/2016   Procedure: BILATERAL HEMILAMINECTOMY LUMBAR THREE-FOUR,LUMBAR FOUR-FIVE,LEFT LUMBAR FIVE-SACRAL ONE HEMILAMINECTOMY AND DECOMPRESSION;  Surgeon: Ashok Pall, MD;  Location: [facility];  Service: Neurosurgery;  Laterality: N/A;  . THYROIDECTOMY     Family History  Problem Relation Age of Onset  . Diabetes Mother   . Arthritis Mother   . Hyperlipidemia Mother   . Mental illness Mother   . Heart  disease Father   . Mental illness Sister   . Arthritis Maternal Grandmother   . Arthritis Maternal Grandfather   . Colon cancer Neg Hx   . Stomach cancer Neg Hx   . Esophageal cancer Neg Hx     Allergies: Pravachol [pravastatin] Current Outpatient Medications on File Prior to Visit  Medication Sig Dispense Refill  . acetaminophen-codeine (TYLENOL #3) 300-30 MG tablet Take 1 tablet by mouth daily as needed for moderate pain. 30 tablet 1  . acidophilus (RISAQUAD) CAPS capsule Take 1 capsule by mouth daily.    Marland Kitchen amitriptyline (ELAVIL) 75 MG tablet TAKE 2 TABLETS AT BEDTIME 180 tablet 4  . BD INSULIN SYRINGE U/F 31G X 5/16" 1 ML MISC USE AS DIRECTED TO ADMINISTER INSULIN 100 each 5  . Blood Glucose Monitoring Suppl (ONETOUCH VERIO) w/Device KIT 1 Device by Does not apply route daily. 1 kit 0  . Cholecalciferol (VITAMIN D) 2000 units tablet Take by mouth.    . Coenzyme Q10 (CO Q 10) 100 MG CAPS Take 1 capsule by mouth daily.    Marland Kitchen CRANBERRY PO Take 1 tablet by mouth every Monday, Wednesday, and Friday.    . docusate sodium (COLACE) 100 MG capsule Take 1 capsule (100 mg total) by mouth 2 (two) times daily. (Patient taking differently: Take 100 mg by mouth every other day. ) 10 capsule 0  . gabapentin (  NEURONTIN) 100 MG capsule TAKE 2 CAPSULES THREE TIMES A DAY 540 capsule 1  . glucagon (GLUCAGON EMERGENCY) 1 MG injection Inject 1 mg into the vein once as needed. For hypoglycemic episodes. E11.42 1 each 3  . glucose blood (ONETOUCH VERIO) test strip USED TO CHECK BLOOD SUGARS UP TO 5 TIMES A DAY. 200 each 12  . insulin regular (HUMULIN R) 100 units/mL injection INJECT 3 UNITS BEFORE BREAKFAST, 6 UNITS BEFORE LUNCH AND 6 UNITS BEFORE DINNER (Patient taking differently: INJECT 3 UNITS BEFORE BREAKFAST, 3 UNITS BEFORE LUNCH AND 3 UNITS BEFORE DINNER) 30 mL 2  . levothyroxine (SYNTHROID, LEVOTHROID) 150 MCG tablet TAKE 1 TABLET DAILY 90 tablet 4  . lidocaine (LIDODERM) 5 % PLACE 1 PATCH ON THE SKIN  DAILY 90 patch 1  . lisinopril (PRINIVIL,ZESTRIL) 10 MG tablet Take 1 tablet (10 mg total) by mouth daily. 90 tablet 1  . omeprazole (PRILOSEC) 40 MG capsule TAKE 1 CAPSULE DAILY 90 capsule 3  . pyridOXINE (VITAMIN B-6) 100 MG tablet Take 100 mg by mouth daily.    . riboflavin (VITAMIN B-2) 100 MG TABS tablet Take 100 mg by mouth daily.    . simvastatin (ZOCOR) 40 MG tablet Take 1 tablet (40 mg total) by mouth at bedtime. 90 tablet 1  . TRADJENTA 5 MG TABS tablet TAKE 1 TABLET DAILY 90 tablet 4  . Turmeric 500 MG TABS Take 750 mg by mouth 2 (two) times daily.    . vitamin B-12 (CYANOCOBALAMIN) 500 MCG tablet Take 500 mcg by mouth daily.    Marland Kitchen albuterol (PROVENTIL HFA;VENTOLIN HFA) 108 (90 Base) MCG/ACT inhaler Inhale 2 puffs into the lungs every 6 (six) hours as needed for wheezing or shortness of breath. (Patient not taking: Reported on 04/04/2018) 1 Inhaler 2   Current Facility-Administered Medications on File Prior to Visit  Medication Dose Route Frequency Provider Last Rate Last Dose  . 0.9 %  sodium chloride infusion  500 mL Intravenous Once Milus Banister, MD        Social History   Tobacco Use  . Smoking status: Never Smoker  . Smokeless tobacco: Never Used  Substance Use Topics  . Alcohol use: No  . Drug use: No    Review of Systems  Constitutional: Negative for chills and fever.  Respiratory: Negative for cough.   Cardiovascular: Negative for chest pain and palpitations.  Gastrointestinal: Negative for nausea and vomiting.  Genitourinary: Negative for dysuria and frequency.      Objective:    BP (!) 122/58 (BP Location: Left Arm)   Pulse 74   Temp 97.7 F (36.5 C)   Wt 151 lb (68.5 kg)   SpO2 97%   BMI 26.33 kg/m  BP Readings from Last 3 Encounters:  04/04/18 (!) 122/58  01/01/18 (!) 132/58  11/22/17 (!) 150/52   Wt Readings from Last 3 Encounters:  04/04/18 151 lb (68.5 kg)  04/02/18 151 lb 9.6 oz (68.8 kg)  01/01/18 154 lb 6.4 oz (70 kg)    Physical  Exam Vitals signs reviewed.  Constitutional:      Appearance: She is well-developed.  Eyes:     Conjunctiva/sclera: Conjunctivae normal.  Neck:     Thyroid: No thyroid mass or thyromegaly.  Cardiovascular:     Rate and Rhythm: Normal rate and regular rhythm.     Pulses: Normal pulses.     Heart sounds: Normal heart sounds.  Pulmonary:     Effort: Pulmonary effort is normal.  Breath sounds: Normal breath sounds. No wheezing, rhonchi or rales.  Lymphadenopathy:     Head:     Right side of head: No submental, submandibular, tonsillar, preauricular, posterior auricular or occipital adenopathy.     Left side of head: No submental, submandibular, tonsillar, preauricular, posterior auricular or occipital adenopathy.     Cervical: No cervical adenopathy.  Skin:    General: Skin is warm and dry.  Neurological:     Mental Status: She is alert.  Psychiatric:        Speech: Speech normal.        Behavior: Behavior normal.        Thought Content: Thought content normal.        Assessment & Plan:   Problem List Items Addressed This Visit      Cardiovascular and Mediastinum   Essential hypertension - Primary    At goal, continue current regimen      Relevant Orders   Comprehensive metabolic panel     Endocrine   DM type 2 with diabetic peripheral neuropathy (HCC)    No hypoglycemic episodes.  Pending A1c today.  Patient to establish with endocrine, will follow      Relevant Medications   insulin NPH Human (HUMULIN N) 100 UNIT/ML injection   Other Relevant Orders   Hemoglobin A1c   Lipid panel   C-peptide     Genitourinary   CKD (chronic kidney disease) stage 3, GFR 30-59 ml/min (HCC)    Established with nephrology, will follow        Other   Hyperlipidemia    Currently on Zocor 3 times a week, discussed possibly changing to more potent statin based on lipid panel which was ordered today.  Will follow          I have discontinued Takiera M. Cathers's glipiZIDE.  I am also having her maintain her pyridOXINE, riboflavin, glucagon, docusate sodium, lidocaine, acidophilus, CRANBERRY PO, Co Q 10, albuterol, Vitamin D, Turmeric, lisinopril, omeprazole, BD Insulin Syringe U/F, insulin regular, simvastatin, amitriptyline, gabapentin, levothyroxine, vitamin B-12, acetaminophen-codeine, Tradjenta, glucose blood, OneTouch Verio, and insulin NPH Human. We will continue to administer sodium chloride.   Meds ordered this encounter  Medications  . DISCONTD: insulin NPH Human (HUMULIN N) 100 UNIT/ML injection    Sig: INJECT 15 UNITS EVERY MORNING AND 5 UNITS AT NIGHT    Dispense:  30 mL    Refill:  0    Order Specific Question:   Supervising Provider    Answer:   Deborra Medina L [[id number]]  . insulin NPH Human (HUMULIN N) 100 UNIT/ML injection    Sig: INJECT 15 UNITS EVERY MORNING AND 5 UNITS AT NIGHT    Dispense:  30 mL    Refill:  4    Order Specific Question:   Supervising Provider    Answer:   Crecencio Mc [[id number]]    Return precautions given.   Risks, benefits, and alternatives of the medications and treatment plan prescribed today were discussed, and patient expressed understanding.   Education regarding symptom management and diagnosis given to patient on AVS.  Continue to follow with Burnard Hawthorne, FNP for routine health maintenance.   [place] and I agreed with plan.   Mable Paris, FNP

## 2018-04-04 NOTE — Assessment & Plan Note (Signed)
At goal, continue current regimen 

## 2018-04-04 NOTE — Assessment & Plan Note (Signed)
Currently on Zocor 3 times a week, discussed possibly changing to more potent statin based on lipid panel which was ordered today.  Will follow

## 2018-04-07 LAB — C-PEPTIDE: C-Peptide: 0.62 ng/mL — ABNORMAL LOW (ref 0.80–3.85)

## 2018-04-10 ENCOUNTER — Other Ambulatory Visit: Payer: Self-pay | Admitting: Family

## 2018-04-11 ENCOUNTER — Other Ambulatory Visit: Payer: Self-pay

## 2018-04-14 ENCOUNTER — Other Ambulatory Visit: Payer: Self-pay

## 2018-04-14 ENCOUNTER — Encounter: Payer: Self-pay | Admitting: Internal Medicine

## 2018-04-14 ENCOUNTER — Ambulatory Visit (INDEPENDENT_AMBULATORY_CARE_PROVIDER_SITE_OTHER): Payer: Medicare HMO | Admitting: Internal Medicine

## 2018-04-14 VITALS — BP 132/60 | HR 87 | Ht 63.5 in | Wt 151.2 lb

## 2018-04-14 DIAGNOSIS — E1142 Type 2 diabetes mellitus with diabetic polyneuropathy: Secondary | ICD-10-CM

## 2018-04-14 LAB — GLUCOSE, POCT (MANUAL RESULT ENTRY): POC Glucose: 283 mg/dl — AB (ref 70–99)

## 2018-04-14 MED ORDER — DULAGLUTIDE 0.75 MG/0.5ML ~~LOC~~ SOAJ: 0.7500 mg | SUBCUTANEOUS | 1 refills | Status: DC

## 2018-04-14 NOTE — Progress Notes (Signed)
Name: Amy Calhoun  MRN/ DOB: [id number], 1928-02-19   Age/ Sex: 85 y.o., female    PCP: Burnard Hawthorne, FNP   Reason for Endocrinology Evaluation: Type 2 Diabetes Mellitus     Date of Initial Endocrinology Visit: 04/15/2018     PATIENT IDENTIFIER: Amy Calhoun is a 85 y.o. female with a past medical history of T2DM, Dyslipidemia, Hypothyroidism and HTN. The patient presented for initial endocrinology clinic visit on 04/15/2018 for consultative assistance with her diabetes management.    HPI: Amy Calhoun is here with her daughter Amy Calhoun, she used to be cared by her other daughter in [place] but sister had passed due to a motorcycle accident and Amy Calhoun brought mother to [facility] in 2017.   Diagnosed with DM at age 51  Prior Medications tried/Intolerance: has been on insulin since diagnosis. She was briefly on Glipizide but was stopped 03/2018. She does not recall any other oral glycemic agents.  Currently checking blood sugars 4 x / day,  before meals Hypoglycemia episodes : no  Hemoglobin A1c has ranged from 6.5 % in 2018, peaking at 8.9%  In 2018. Patient required assistance for hypoglycemia: Yes- 2018 Patient has required hospitalization within the last 1 year from hyper or hypoglycemia: no  In terms of diet, the patient avoids sugar-sweetened beverages and does not snack.  [place]    Daughter would like to explore putting mother on glycemic agents, she is concerned about having MDI regimen as mother gets older and worried about future dexterity.     HOME DIABETES REGIMEN: Humulin-N 15 units QAm and 5 units QPM Humulin-R SS  Tradjenta 5 mg daily   Statin: yes ACE-I/ARB: yes Prior Diabetic Education: Yes   METER DOWNLOAD SUMMARY: Did not bring    DIABETIC COMPLICATIONS: Microvascular complications:   Neuropathy, CKD  Denies: retinopathy   Last eye exam: Completed 10/2017  Macrovascular complications:    Denies: CAD, PVD, CVA   PAST  HISTORY: Past Medical History:  Past Medical History:  Diagnosis Date  . Chronic kidney disease 08/07/2016   Chronic renal failure, stage III  . Depression   . Esophageal dysphagia   . Gastric ulcer   . GERD (gastroesophageal reflux disease)   . Hyperlipidemia   . Hypertension   . Hypothyroidism   . IDDM (insulin dependent diabetes mellitus) ([place])   . Ischemic colitis ([place])   . Osteoarthritis   . Ulcer of esophagus    Past Surgical History:  Past Surgical History:  Procedure Laterality Date  . ABDOMINAL HYSTERECTOMY    . COLONOSCOPY  2010  . ESOPHAGEAL DILATION  08/07/2016   usually a couple times a year, last time 07/30/16  . GALLBLADDER SURGERY    . LUMBAR LAMINECTOMY/DECOMPRESSION MICRODISCECTOMY N/A 08/10/2016   Procedure: BILATERAL HEMILAMINECTOMY LUMBAR THREE-FOUR,LUMBAR FOUR-FIVE,LEFT LUMBAR FIVE-SACRAL ONE HEMILAMINECTOMY AND DECOMPRESSION;  Surgeon: Ashok Pall, MD;  Location: [place];  Service: Neurosurgery;  Laterality: N/A;  . THYROIDECTOMY        Social History:  reports that she has never smoked. She has never used smokeless tobacco. She reports that she does not drink alcohol or use drugs. Family History:  Family History  Problem Relation Age of Onset  . Diabetes Mother   . Arthritis Mother   . Hyperlipidemia Mother   . Mental illness Mother   . Heart disease Father   . Mental illness Sister   . Arthritis Maternal Grandmother   . Arthritis Maternal Grandfather   .  Colon cancer Neg Hx   . Stomach cancer Neg Hx   . Esophageal cancer Neg Hx      HOME MEDICATIONS: Allergies as of 04/14/2018      Reactions   Pravachol [pravastatin] Other (See Comments)   States she refused due to side effects      Medication List       Accurate as of April 14, 2018 11:59 PM. Always use your most recent med list.        acetaminophen-codeine 300-30 MG tablet Commonly known as:  TYLENOL #3 Take 1 tablet by mouth daily as needed for moderate pain.   acidophilus  Caps capsule Take 1 capsule by mouth daily.   albuterol 108 (90 Base) MCG/ACT inhaler Commonly known as:  PROVENTIL HFA;VENTOLIN HFA Inhale 2 puffs into the lungs every 6 (six) hours as needed for wheezing or shortness of breath.   amitriptyline 75 MG tablet Commonly known as:  ELAVIL TAKE 2 TABLETS AT BEDTIME   BD Insulin Syringe U/F 31G X 5/16" 1 ML Misc Generic drug:  Insulin Syringe-Needle U-100 USE AS DIRECTED TO ADMINISTER INSULIN   Co Q 10 100 MG Caps Take 1 capsule by mouth daily.   CRANBERRY PO Take 1 tablet by mouth every Monday, Wednesday, and Friday.   docusate sodium 100 MG capsule Commonly known as:  COLACE Take 1 capsule (100 mg total) by mouth 2 (two) times daily.   Dulaglutide 0.75 MG/0.5ML Sopn Commonly known as:  Trulicity Inject 6.81 mg into the skin once a week.   gabapentin 100 MG capsule Commonly known as:  NEURONTIN TAKE 2 CAPSULES THREE TIMES A DAY   glucagon 1 MG injection Commonly known as:  Glucagon Emergency Inject 1 mg into the vein once as needed. For hypoglycemic episodes. E11.42   glucose blood test strip Commonly known as:  OneTouch Verio USED TO CHECK BLOOD SUGARS UP TO 5 TIMES A DAY.   insulin NPH Human 100 UNIT/ML injection Commonly known as:  HumuLIN N INJECT 15 UNITS EVERY MORNING AND 5 UNITS AT NIGHT   insulin regular 100 units/mL injection Commonly known as:  HumuLIN R INJECT 3 UNITS BEFORE BREAKFAST, 6 UNITS BEFORE LUNCH AND 6 UNITS BEFORE DINNER   levothyroxine 150 MCG tablet Commonly known as:  SYNTHROID, LEVOTHROID TAKE 1 TABLET DAILY   lidocaine 5 % Commonly known as:  LIDODERM PLACE 1 PATCH ON THE SKIN DAILY   lisinopril 10 MG tablet Commonly known as:  PRINIVIL,ZESTRIL TAKE 1 TABLET DAILY   omeprazole 40 MG capsule Commonly known as:  PRILOSEC TAKE 1 CAPSULE DAILY   OneTouch Verio w/Device Kit 1 Device by Does not apply route daily.   pyridOXINE 100 MG tablet Commonly known as:  VITAMIN B-6 Take 100  mg by mouth daily.   riboflavin 100 MG Tabs tablet Commonly known as:  VITAMIN B-2 Take 100 mg by mouth daily.   simvastatin 40 MG tablet Commonly known as:  ZOCOR Take 1 tablet (40 mg total) by mouth at bedtime.   Turmeric 500 MG Tabs Take 750 mg by mouth 2 (two) times daily.   vitamin B-12 500 MCG tablet Commonly known as:  CYANOCOBALAMIN Take 500 mcg by mouth daily.   Vitamin D 50 MCG (2000 UT) tablet Take by mouth.        ALLERGIES: Allergies  Allergen Reactions  . Pravachol [Pravastatin] Other (See Comments)    States she refused due to side effects     REVIEW OF SYSTEMS: A comprehensive  ROS was conducted with the patient and is negative except as per HPI and below:  Review of Systems  Constitutional: Negative for fever.  HENT: Negative for congestion and sore throat.   Eyes: Positive for blurred vision. Negative for pain.  Respiratory: Negative for cough and shortness of breath.   Cardiovascular: Negative for chest pain and palpitations.  Gastrointestinal: Negative for diarrhea and nausea.  Genitourinary: Negative for frequency.  Neurological: Negative for tingling and tremors.  Endo/Heme/Allergies: Negative for polydipsia.  Psychiatric/Behavioral: Negative for depression. The patient is not nervous/anxious.       OBJECTIVE:   VITAL SIGNS: BP 132/60 (BP Location: Left Arm, Patient Position: Sitting, Cuff Size: Normal)   Pulse 87   Ht 5' 3.5" (1.613 m)   Wt 151 lb 3.2 oz (68.6 kg)   SpO2 90%   BMI 26.36 kg/m    PHYSICAL EXAM:  General: Pt appears well and is in NAD  Hydration: Well-hydrated with moist mucous membranes and good skin turgor  HEENT: Head: Unremarkable with good dentition. Oropharynx clear without exudate.  Eyes: External eye exam normal without stare, lid lag or exophthalmos.  EOM intact.    Neck: General: Supple without adenopathy or carotid bruits. Thyroid: Thyroid size normal.  No goiter or nodules appreciated. No thyroid bruit.   Lungs: Clear with good BS bilat with no rales, rhonchi, or wheezes  Heart: RRR with normal S1 and S2 and no gallops; no murmurs; no rub  Abdomen: Normoactive bowel sounds, soft, nontender, without masses or organomegaly palpable  Extremities:  Lower extremities - No pretibial edema. No lesions.  Skin: Normal texture and temperature to palpation. No rash noted. No Acanthosis nigricans/skin tags. No lipohypertrophy.  Neuro: MS is good with appropriate affect, pt is alert and Ox3    DM foot exam: deferred   DATA REVIEWED:  Lab Results  Component Value Date   HGBA1C 7.9 (H) 04/04/2018   HGBA1C 7.8 (H) 10/10/2017   HGBA1C 7.2 (H) 01/02/2017   Lab Results  Component Value Date   MICROALBUR 0.8 01/02/2017   LDLCALC 71 04/04/2018   CREATININE 1.12 04/04/2018   Lab Results  Component Value Date   MICRALBCREAT 1.5 01/02/2017    Lab Results  Component Value Date   CHOL 146 04/04/2018   HDL 56.20 04/04/2018   LDLCALC 71 04/04/2018   TRIG 93.0 04/04/2018   CHOLHDL 3 04/04/2018       In-Office BG 283 mg/dL  ASSESSMENT / PLAN / RECOMMENDATIONS:   1) Type 2 Diabetes Mellitus, Sub-Optimally controlled, With CKD III and neuropathic complications - Most recent A1c of 7.9 %. Goal A1c < 7.5 %.   Plan: GENERAL:  We discussed the slim change of her being able to control BG's with oral glycemic agents, after being on insulin for ~ 40 yrs, especially with a low C-peptide levels as well as CKDIII. The only option I can think of at this time is to try and add a GLP-1 agonist. She has no hx of medullary thyroid cancer, pt did have thyroidectomy many years ago due to benign goiter per daughter. No hx of pancreatitis either. We discussed risk of GI side effects with GLP-1 agonists.   We discussed insulin analogues as a safer option for the patient but daughter states analogues have not worked for the patient in the past. We can explore this in the future as pens maybe an easier option for her.   I have discussed my reluctance to do SS and would rather have  a set dose of regular insulin with meals and switching NPH to bedtime.   MEDICATIONS:  Stop Tradjenta  Start Trulicity 2.76 mg weekly  Humulin-N 10 units at bedtime only   Humulin-R 4 units TID QAC  EDUCATION / INSTRUCTIONS:  BG monitoring instructions: Patient is instructed to check her blood sugars 4 times a day, before meals and bedtime.  Call [place] [facility] if: BG persistently < 70 or > 300. . I reviewed the Rule of 15 for the treatment of hypoglycemia in detail with the patient. Literature supplied.   2) Diabetic complications:   Eye: Does not have known diabetic retinopathy.   Neuro/ Feet: Does have known diabetic peripheral neuropathy.  Renal: Patient does have known baseline CKD. She is on an ACEI/ARB at present.   3) Lipids: Patient is on a statin.    4) Hypertension: She is  at goal of < 140/90 mmHg.     F/u in 6 weeks   Signed electronically by: Mack Guise, MD  [facility]  [facility] [address] [place], [place] [address] Phone: [phone] FAX: [phone]   CC: Burnard Hawthorne, FNP [address] [place] [address] Phone: [phone]  Fax: [phone]    Return to Endocrinology clinic as below: Future Appointments  Date Time Provider [place]  05/26/2018  2:00 PM Shamleffer, Melanie Crazier, MD [facility] None

## 2018-04-14 NOTE — Patient Instructions (Addendum)
-   Humulin -N 10 units at Bedtime  - Humulin-R 4 units with each meal  - Trulicity 0.75 mg weekly     - HOW TO TREAT LOW BLOOD SUGARS (Blood sugar LESS THAN 70 MG/DL)  Please follow the RULE OF 15 for the treatment of hypoglycemia treatment (when your (blood sugars are less than 70 mg/dL)    STEP 1: Take 15 grams of carbohydrates when your blood sugar is low, which includes:   3-4 GLUCOSE TABS  OR  3-4 OZ OF JUICE OR REGULAR SODA OR  ONE TUBE OF GLUCOSE GEL     STEP 2: RECHECK blood sugar in 15 MINUTES STEP 3: If your blood sugar is still low at the 15 minute recheck --> then, go back to STEP 1 and treat AGAIN with another 15 grams of carbohydrates.

## 2018-04-15 MED ORDER — INSULIN NPH (HUMAN) (ISOPHANE) 100 UNIT/ML ~~LOC~~ SUSP: 10.0000 [IU] | Freq: Every day | SUBCUTANEOUS | 4 refills | Status: DC

## 2018-04-15 MED ORDER — INSULIN REGULAR HUMAN 100 UNIT/ML IJ SOLN: 4.0000 [IU] | Freq: Three times a day (TID) | INTRAMUSCULAR | 3 refills | Status: DC

## 2018-04-17 DIAGNOSIS — E1142 Type 2 diabetes mellitus with diabetic polyneuropathy: Secondary | ICD-10-CM | POA: Diagnosis not present

## 2018-04-25 DIAGNOSIS — R69 Illness, unspecified: Secondary | ICD-10-CM | POA: Diagnosis not present

## 2018-04-27 ENCOUNTER — Other Ambulatory Visit: Payer: Self-pay | Admitting: Family

## 2018-04-27 DIAGNOSIS — E1142 Type 2 diabetes mellitus with diabetic polyneuropathy: Secondary | ICD-10-CM

## 2018-05-19 DIAGNOSIS — E1142 Type 2 diabetes mellitus with diabetic polyneuropathy: Secondary | ICD-10-CM | POA: Diagnosis not present

## 2018-05-23 ENCOUNTER — Ambulatory Visit (INDEPENDENT_AMBULATORY_CARE_PROVIDER_SITE_OTHER): Payer: Medicare HMO | Admitting: Family

## 2018-05-23 ENCOUNTER — Encounter: Payer: Self-pay | Admitting: Family

## 2018-05-23 ENCOUNTER — Telehealth: Payer: Self-pay

## 2018-05-23 DIAGNOSIS — M25512 Pain in left shoulder: Secondary | ICD-10-CM

## 2018-05-23 DIAGNOSIS — R197 Diarrhea, unspecified: Secondary | ICD-10-CM | POA: Diagnosis not present

## 2018-05-23 NOTE — Assessment & Plan Note (Signed)
Improved today.  Using lidocaine, PRN Tylenol # 3.  Declines x-ray.  She will let me know how she is doing

## 2018-05-23 NOTE — Progress Notes (Signed)
Printed and mailed

## 2018-05-23 NOTE — Patient Instructions (Signed)
As discussed, I would like you to stay very very vigilant today, and over the weekend.  If if diarrhea were to return, new symptoms present including dizziness, falls, I would like patient to present to emergency room.  Please encourage frequent small sips of water, popsicles etc.  It is very important to stay hydrated.    Any change in mental status, new symptoms or concerns, again, please seek immediate evaluation in the emergency room.  Stay safe.

## 2018-05-23 NOTE — Telephone Encounter (Signed)
Copied from CRM [id number]. Topic: Appointment Scheduling - Scheduling Inquiry for Clinic >> May 22, 2018  4:23 PM Gwenlyn Fudge A wrote: Reason for CRM: Pts daughter called stating her mother had a bad day. She states her mother had really bad diarrhea, was weak, and was running a low grade fever of 99.5. Pts daughter is very concerned and would like to be contacted as soon as possible to set up a virtual visit. Please advise. Appt scheduled today with Arnett.  Nina,cma

## 2018-05-23 NOTE — Progress Notes (Signed)
This visit type was conducted due to national recommendations for restrictions regarding the COVID-19 pandemic (e.g. social distancing).  This format is felt to be most appropriate for this patient at this time.  All issues noted in this document were discussed and addressed.  No physical exam was performed (except for noted visual exam findings with Video Visits). Virtual Visit via Video Note  I connected with@  on 05/23/18 at 11:30 AM EDT by a video enabled telemedicine application and verified that I am speaking with the correct person using two identifiers.  Location patient: home Location provider:work or home office Persons participating in the virtual visit: patient, provider  I discussed the limitations of evaluation and management by telemedicine and the availability of in person appointments. The patient expressed understanding and agreed to proceed.  Interactive audio and video telecommunications were attempted between this provider and patient, however failed, due to patient having technical difficulties or patient did not have access to video capability.  We continued and completed visit with audio only.   HPI:  Accompanied by daughter.  CC: several episodes watery brown diarrhea x 1 day, no episodes today.  Daughter saw the stool and thought had a pink color. No abdominal pain, chest pain, dizziness, HA, vision changes. NO recent antibiotic.  NO vomiting, fever.   Lives at [place].   Walking normally. No weakness.    Had a 'big blow out' yesterday'  Per daughter.   [place] several times  (approx 5)  yesterday and too weak to get back in bed.  Hit her head and left shoulder on mattress and also on carpet floor.  Blood sugar at the time was 366, , and temperature was 95.5 F. Gave her heating blanket and gave a shower. 138/64, 98% RA per daughter.  Drinking, eating well.   H/o colitis.   Has taken tylenol #3 due to pain in left shoulder. Improved with lidocaine patch. Full  ROM. No numbness.   Lives behind white [employer]; whom has positive.   No bruise or bump on head from fall. She is not on ASA or anticoagulant.   Has seen nutritionist   Dr Kelton Pillar- 03/2018; seeing her again next week.   Stop Tradjenta  Start Trulicity 2.77 mg weekly  Humulin-N 10 units at bedtime only  Humulin-R 4 units TID QAC  CKD- Dr Moshe Cipro  ROS: See pertinent positives and negatives per HPI.  Past Medical History:  Diagnosis Date  . Chronic kidney disease 08/07/2016   Chronic renal failure, stage III  . Depression   . Esophageal dysphagia   . Gastric ulcer   . GERD (gastroesophageal reflux disease)   . Hyperlipidemia   . Hypertension   . Hypothyroidism   . IDDM (insulin dependent diabetes mellitus) ([place])   . Ischemic colitis ([place])   . Osteoarthritis   . Ulcer of esophagus     Past Surgical History:  Procedure Laterality Date  . ABDOMINAL HYSTERECTOMY    . COLONOSCOPY  2010  . ESOPHAGEAL DILATION  08/07/2016   usually a couple times a year, last time 07/30/16  . GALLBLADDER SURGERY    . LUMBAR LAMINECTOMY/DECOMPRESSION MICRODISCECTOMY N/A 08/10/2016   Procedure: BILATERAL HEMILAMINECTOMY LUMBAR THREE-FOUR,LUMBAR FOUR-FIVE,LEFT LUMBAR FIVE-SACRAL ONE HEMILAMINECTOMY AND DECOMPRESSION;  Surgeon: Ashok Pall, MD;  Location: [place];  Service: Neurosurgery;  Laterality: N/A;  . THYROIDECTOMY      Family History  Problem Relation Age of Onset  . Diabetes Mother   . Arthritis Mother   . Hyperlipidemia Mother   .  Mental illness Mother   . Heart disease Father   . Mental illness Sister   . Arthritis Maternal Grandmother   . Arthritis Maternal Grandfather   . Colon cancer Neg Hx   . Stomach cancer Neg Hx   . Esophageal cancer Neg Hx     SOCIAL HX: never smoker   Current Outpatient Medications:  .  acetaminophen-codeine (TYLENOL #3) 300-30 MG tablet, Take 1 tablet by mouth daily as needed for moderate pain., Disp: 30 tablet, Rfl: 1 .  acidophilus  (RISAQUAD) CAPS capsule, Take 1 capsule by mouth daily., Disp: , Rfl:  .  amitriptyline (ELAVIL) 75 MG tablet, TAKE 2 TABLETS AT BEDTIME, Disp: 180 tablet, Rfl: 4 .  BD INSULIN SYRINGE U/F 31G X 5/16" 1 ML MISC, USE AS DIRECTED TO ADMINISTER INSULIN, Disp: 100 each, Rfl: 5 .  Blood Glucose Monitoring Suppl (ONETOUCH VERIO) w/Device KIT, 1 Device by Does not apply route daily., Disp: 1 kit, Rfl: 0 .  Cholecalciferol (VITAMIN D) 2000 units tablet, Take by mouth., Disp: , Rfl:  .  Coenzyme Q10 (CO Q 10) 100 MG CAPS, Take 1 capsule by mouth daily., Disp: , Rfl:  .  CRANBERRY PO, Take 1 tablet by mouth every Monday, Wednesday, and Friday., Disp: , Rfl:  .  docusate sodium (COLACE) 100 MG capsule, Take 1 capsule (100 mg total) by mouth 2 (two) times daily. (Patient taking differently: Take 100 mg by mouth every other day. ), Disp: 10 capsule, Rfl: 0 .  Dulaglutide (TRULICITY) 5.57 DU/2.0UR SOPN, Inject 0.75 mg into the skin once a week., Disp: 4 pen, Rfl: 1 .  gabapentin (NEURONTIN) 100 MG capsule, TAKE 2 CAPSULES THREE TIMES A DAY, Disp: 540 capsule, Rfl: 1 .  glucagon (GLUCAGON EMERGENCY) 1 MG injection, Inject 1 mg into the vein once as needed. For hypoglycemic episodes. E11.42, Disp: 1 each, Rfl: 3 .  glucose blood (ONETOUCH VERIO) test strip, USED TO CHECK BLOOD SUGARS UP TO 5 TIMES A DAY., Disp: 200 each, Rfl: 12 .  HUMULIN N 100 UNIT/ML injection, INJECT 15 UNITS EVERY MORNING AND 5 UNITS AT NIGHT (Patient taking differently: 10 Units. 10 units daily at bedtime.), Disp: 10 mL, Rfl: 0 .  insulin regular (HUMULIN R) 100 units/mL injection, Inject 0.04 mLs (4 Units total) into the skin 3 (three) times daily before meals., Disp: 10 mL, Rfl: 3 .  levothyroxine (SYNTHROID, LEVOTHROID) 150 MCG tablet, TAKE 1 TABLET DAILY, Disp: 90 tablet, Rfl: 4 .  lidocaine (LIDODERM) 5 %, PLACE 1 PATCH ON THE SKIN DAILY, Disp: 90 patch, Rfl: 1 .  lisinopril (PRINIVIL,ZESTRIL) 10 MG tablet, TAKE 1 TABLET DAILY, Disp: 90  tablet, Rfl: 3 .  omeprazole (PRILOSEC) 40 MG capsule, TAKE 1 CAPSULE DAILY, Disp: 90 capsule, Rfl: 3 .  pyridOXINE (VITAMIN B-6) 100 MG tablet, Take 100 mg by mouth daily., Disp: , Rfl:  .  riboflavin (VITAMIN B-2) 100 MG TABS tablet, Take 100 mg by mouth daily., Disp: , Rfl:  .  simvastatin (ZOCOR) 40 MG tablet, Take 1 tablet (40 mg total) by mouth at bedtime. (Patient taking differently: Take 40 mg by mouth 3 (three) times a week. ), Disp: 90 tablet, Rfl: 1 .  Turmeric 500 MG TABS, Take 750 mg by mouth 2 (two) times daily., Disp: , Rfl:  .  vitamin B-12 (CYANOCOBALAMIN) 500 MCG tablet, Take 500 mcg by mouth daily., Disp: , Rfl:  .  albuterol (PROVENTIL HFA;VENTOLIN HFA) 108 (90 Base) MCG/ACT inhaler, Inhale 2 puffs into  the lungs every 6 (six) hours as needed for wheezing or shortness of breath. (Patient not taking: Reported on 05/23/2018), Disp: 1 Inhaler, Rfl: 2  Current Facility-Administered Medications:  .  0.9 %  sodium chloride infusion, 500 mL, Intravenous, Once, Milus Banister, MD  EXAM:  VITALS per patient if applicable:  GENERAL: alert, oriented, appears well and in no acute distress  HEENT: atraumatic, conjunttiva clear, no obvious abnormalities on inspection of external nose and ears  NECK: normal movements of the head and neck  LUNGS: on inspection no signs of respiratory distress, breathing rate appears normal, no obvious gross SOB, gasping or wheezing  CV: no obvious cyanosis  MS: moves all visible extremities without noticeable abnormality  PSYCH/NEURO: pleasant and cooperative, no obvious depression or anxiety, speech and thought processing grossly intact  ASSESSMENT AND PLAN:  Discussed the following assessment and plan:  Diarrhea, unspecified type  Acute pain of left shoulder  Problem List Items Addressed This Visit      Other   Acute pain of left shoulder    Improved today.  Using lidocaine, PRN Tylenol # 3.  Declines x-ray.  She will let me know how  she is doing      Diarrhea - Primary    No episodes today.  Reassured as symptoms have resolved today.  Patient is eating and drinking well.  Patient daughter declined pursuing labs, imaging including x-ray of left shoulder image of brain including CT or MRI.  Discussed at length that dizziness can be a subtle sign of TIA, CVA.  Patient is not on any anticoagulation at this time. She has a history of colitis.  She was declines coming in for any labs, stool culture, occult stool cards.  Daughter and patient will stay vigilant of the weekend and certainly let me know of new or worsening symptoms.  Advised frequent sips of water, and focus on hydration.  If she were to have more diarrhea over the weekend , unable to keep yourself hydrated, becomes symptomatic. I did advise emergency room particularly as I described as COVID-19 can manifest with GI symptoms.  Patient and daughter verbalized understanding of all           I discussed the assessment and treatment plan with the patient. The patient was provided an opportunity to ask questions and all were answered. The patient agreed with the plan and demonstrated an understanding of the instructions.   The patient was advised to call back or seek an in-person evaluation if the symptoms worsen or if the condition fails to improve as anticipated.   Mable Paris, FNP    I spent 25 min non face to face w/ pt.

## 2018-05-23 NOTE — Assessment & Plan Note (Addendum)
No episodes today.  Reassured as symptoms have resolved today.  Patient is eating and drinking well.  Patient daughter declined pursuing labs, imaging including x-ray of left shoulder image of brain including CT or MRI.  Discussed at length that dizziness can be a subtle sign of TIA, CVA.  Patient is not on any anticoagulation at this time. She has a history of colitis.  She was declines coming in for any labs, stool culture, occult stool cards.  Daughter and patient will stay vigilant of the weekend and certainly let me know of new or worsening symptoms.  Advised frequent sips of water, and focus on hydration.  If she were to have more diarrhea over the weekend , unable to keep yourself hydrated, becomes symptomatic. I did advise emergency room particularly as I described as COVID-19 can manifest with GI symptoms.  Patient and daughter verbalized understanding of all

## 2018-05-26 ENCOUNTER — Ambulatory Visit (INDEPENDENT_AMBULATORY_CARE_PROVIDER_SITE_OTHER): Payer: Medicare HMO | Admitting: Internal Medicine

## 2018-05-26 ENCOUNTER — Encounter: Payer: Self-pay | Admitting: Internal Medicine

## 2018-05-26 ENCOUNTER — Telehealth: Payer: Self-pay | Admitting: *Deleted

## 2018-05-26 ENCOUNTER — Other Ambulatory Visit: Payer: Self-pay

## 2018-05-26 DIAGNOSIS — E1142 Type 2 diabetes mellitus with diabetic polyneuropathy: Secondary | ICD-10-CM | POA: Diagnosis not present

## 2018-05-26 MED ORDER — INSULIN NPH (HUMAN) (ISOPHANE) 100 UNIT/ML ~~LOC~~ SUSP: 6.0000 [IU] | Freq: Every day | SUBCUTANEOUS | 0 refills | Status: DC

## 2018-05-26 MED ORDER — DULAGLUTIDE 1.5 MG/0.5ML ~~LOC~~ SOAJ: 1.5000 mg | SUBCUTANEOUS | 3 refills | Status: DC

## 2018-05-26 MED ORDER — INSULIN REGULAR HUMAN 100 UNIT/ML IJ SOLN: 3.0000 [IU] | Freq: Three times a day (TID) | INTRAMUSCULAR | 3 refills | Status: DC

## 2018-05-26 NOTE — Patient Instructions (Signed)
-   Increase Trulicity to 1.5 mg weekly  - Decrease Novolin- N to 6 units at bedtime  - Novolin-R 3 units with each meal   - HOW TO TREAT LOW BLOOD SUGARS (Blood sugar LESS THAN 70 MG/DL)  Please follow the RULE OF 15 for the treatment of hypoglycemia treatment (when your (blood sugars are less than 70 mg/dL)    STEP 1: Take 15 grams of carbohydrates when your blood sugar is low, which includes:   3-4 GLUCOSE TABS  OR  3-4 OZ OF JUICE OR REGULAR SODA OR  ONE TUBE OF GLUCOSE GEL     STEP 2: RECHECK blood sugar in 15 MINUTES STEP 3: If your blood sugar is still low at the 15 minute recheck --> then, go back to STEP 1 and treat AGAIN with another 15 grams of carbohydrates.

## 2018-05-26 NOTE — Telephone Encounter (Signed)
Copied from CRM [id number]. Topic: General - Other >> May 26, 2018 12:35 PM Dalphine Handing A wrote: Patients daughter called in to let Rennie Plowman know  that patient had a very good weekend and didn't have any more episodes of diarrhea.

## 2018-05-26 NOTE — Telephone Encounter (Signed)
Noted. Happy to hear. °

## 2018-05-26 NOTE — Progress Notes (Signed)
Name: Amy Calhoun  Age/ Sex: 85 y.o., female   MRN/ DOB: [id number], 1928/03/21     PCP: Burnard Hawthorne, FNP   Reason for Endocrinology Evaluation: Type 2 Diabetes Mellitus  Initial Endocrine Consultative Visit: 04/15/2018    PATIENT IDENTIFIER: Amy Calhoun is a 85 y.o. female with a past medical history of T2DM, Dyslipidemia, Hypothyroidism and HTN . The patient has followed with Endocrinology clinic since 04/15/2018 for consultative assistance with management of her diabetes.   Patient used to be cared for by her other daughter in [place] but daughter had passed due to a motorcycle accident and Amy Calhoun brought mother to [facility] in 2017.   DIABETIC HISTORY:  Amy Calhoun was diagnosed with DM at age 72. She has been on insulin since her diagnosis.She was briefly on glipizide but this was stopped in March, 2020. Pt did not recall any other oral glycemic agents. Her hemoglobin A1c has ranged from 6.5 % in 2018, peaking at 8.9%  In 2018.  Daughter would like to explore putting mother on glycemic agents, she is concerned about having MDI regimen as mother gets older and worried about future dexterity.   On her initial visit to our clinic, she was on Humulin-N BID, Humulin-R per SS and Tradjenta.  We stopped Tradjenta, started Trulicity in March, 4680, with reduction in insulin doses.   SUBJECTIVE:   During the last visit (04/15/2018): A1c was 7.9%. We started Trulicity, switched NP from BID to QHS dosing, we also put her on a set dose of regular insulin with meals.   Today (05/26/2018): Amy Calhoun is here with daughter Amy Calhoun for a 6 week follow up appointment on BG's . She checks her blood sugars 5-4 times daily, preprandial and bedtime. The patient has not had hypoglycemic episodes since the last clinic visit. The patient is not symptomatic with these episodes. Otherwise, the patient has not required any recent emergency interventions for hypoglycemia and has not had recent  hospitalizations secondary to hyper or hypoglycemic episodes.   Pt has been keeping a great record with insulin doses and medication intake. Sometimes she skips taking NPH if her BG is less then 150. And at times she would take NPH 30 units for a bedtime Bg of 375 mg/dL.    ROS: As per HPI and as detailed below: Review of Systems  Constitutional: Negative for chills and fever.  Respiratory: Negative for cough and shortness of breath.   Cardiovascular: Negative for chest pain and palpitations.  Gastrointestinal: Negative for diarrhea and nausea.      HOME DIABETES REGIMEN:   Trulicity 3.21 mg weekly  Humulin-N 10 units at bedtime only   Humulin-R 4 units TID QAC     CONTINUOUS GLUCOSE MONITORING RECORD INTERPRETATION    Dates of Recording: 4/28- 05/26/2018  Sensor description:Freestyle Libre  Results statistics:   CGM use % of time 84  Average and SD 195/ 38.2  Time in range   42   %  % Time Above 180 33  % Time above 250 24  % Time Below target 1    Glycemic patterns summary: postprandial hyperglycemia   Hypoglycemic episodes occurred after post-prandial bolus  Overnight periods: stable      HISTORY:  Past Medical History:  Past Medical History:  Diagnosis Date   Chronic kidney disease 08/07/2016   Chronic renal failure, stage III   Depression    Esophageal dysphagia    Gastric ulcer    GERD (gastroesophageal reflux disease)  Hyperlipidemia    Hypertension    Hypothyroidism    IDDM (insulin dependent diabetes mellitus) ([place])    Ischemic colitis ([place])    Osteoarthritis    Ulcer of esophagus    Past Surgical History:  Past Surgical History:  Procedure Laterality Date   ABDOMINAL HYSTERECTOMY     COLONOSCOPY  2010   ESOPHAGEAL DILATION  08/07/2016   usually a couple times a year, last time 07/30/16   GALLBLADDER SURGERY     LUMBAR LAMINECTOMY/DECOMPRESSION MICRODISCECTOMY N/A 08/10/2016   Procedure: BILATERAL HEMILAMINECTOMY  LUMBAR THREE-FOUR,LUMBAR FOUR-FIVE,LEFT LUMBAR FIVE-SACRAL ONE HEMILAMINECTOMY AND DECOMPRESSION;  Surgeon: Ashok Pall, MD;  Location: [place];  Service: Neurosurgery;  Laterality: N/A;   THYROIDECTOMY      Social History:  reports that she has never smoked. She has never used smokeless tobacco. She reports that she does not drink alcohol or use drugs. Family History:  Family History  Problem Relation Age of Onset   Diabetes Mother    Arthritis Mother    Hyperlipidemia Mother    Mental illness Mother    Heart disease Father    Mental illness Sister    Arthritis Maternal Grandmother    Arthritis Maternal Grandfather    Colon cancer Neg Hx    Stomach cancer Neg Hx    Esophageal cancer Neg Hx      HOME MEDICATIONS: Allergies as of 05/26/2018      Reactions   Pravachol [pravastatin] Other (See Comments)   States she refused due to side effects      Medication List       Accurate as of May 26, 2018  3:35 PM. If you have any questions, ask your nurse or doctor.        STOP taking these medications   Dulaglutide 0.75 MG/0.5ML Sopn Commonly known as:  Trulicity Replaced by:  Dulaglutide 1.5 MG/0.5ML Sopn Stopped by:  Dorita Sciara, MD     TAKE these medications   acetaminophen-codeine 300-30 MG tablet Commonly known as:  TYLENOL #3 Take 1 tablet by mouth daily as needed for moderate pain.   acidophilus Caps capsule Take 1 capsule by mouth daily.   albuterol 108 (90 Base) MCG/ACT inhaler Commonly known as:  VENTOLIN HFA Inhale 2 puffs into the lungs every 6 (six) hours as needed for wheezing or shortness of breath.   amitriptyline 75 MG tablet Commonly known as:  ELAVIL TAKE 2 TABLETS AT BEDTIME   BD Insulin Syringe U/F 31G X 5/16" 1 ML Misc Generic drug:  Insulin Syringe-Needle U-100 USE AS DIRECTED TO ADMINISTER INSULIN   Co Q 10 100 MG Caps Take 1 capsule by mouth daily.   CRANBERRY PO Take 1 tablet by mouth every Monday, Wednesday, and  Friday.   docusate sodium 100 MG capsule Commonly known as:  COLACE Take 1 capsule (100 mg total) by mouth 2 (two) times daily. What changed:  when to take this   Dulaglutide 1.5 MG/0.5ML Sopn Commonly known as:  Trulicity Inject 1.5 mg into the skin once a week. Replaces:  Dulaglutide 0.75 MG/0.5ML Sopn Started by:  Dorita Sciara, MD   gabapentin 100 MG capsule Commonly known as:  NEURONTIN TAKE 2 CAPSULES THREE TIMES A DAY   glucagon 1 MG injection Commonly known as:  Glucagon Emergency Inject 1 mg into the vein once as needed. For hypoglycemic episodes. E11.42   glucose blood test strip Commonly known as:  OneTouch Verio USED TO CHECK BLOOD SUGARS UP TO 5 TIMES  A DAY.   HumuLIN N 100 UNIT/ML injection Generic drug:  insulin NPH Human INJECT 15 UNITS EVERY MORNING AND 5 UNITS AT NIGHT What changed:  See the new instructions.   insulin regular 100 units/mL injection Commonly known as:  HumuLIN R Inject 0.04 mLs (4 Units total) into the skin 3 (three) times daily before meals.   levothyroxine 150 MCG tablet Commonly known as:  SYNTHROID TAKE 1 TABLET DAILY   lidocaine 5 % Commonly known as:  LIDODERM PLACE 1 PATCH ON THE SKIN DAILY   lisinopril 10 MG tablet Commonly known as:  ZESTRIL TAKE 1 TABLET DAILY   omeprazole 40 MG capsule Commonly known as:  PRILOSEC TAKE 1 CAPSULE DAILY   OneTouch Verio w/Device Kit 1 Device by Does not apply route daily.   pyridOXINE 100 MG tablet Commonly known as:  VITAMIN B-6 Take 100 mg by mouth daily.   riboflavin 100 MG Tabs tablet Commonly known as:  VITAMIN B-2 Take 100 mg by mouth daily.   simvastatin 40 MG tablet Commonly known as:  ZOCOR Take 1 tablet (40 mg total) by mouth at bedtime. What changed:  when to take this   Turmeric 500 MG Tabs Take 750 mg by mouth 2 (two) times daily.   vitamin B-12 500 MCG tablet Commonly known as:  CYANOCOBALAMIN Take 500 mcg by mouth daily.   Vitamin D 50 MCG (2000  UT) tablet Take by mouth.        OBJECTIVE:   Vital Signs: BP 130/60 (BP Location: Left Arm, Patient Position: Sitting, Cuff Size: Normal)    Pulse 87    Temp 97.9 F (36.6 C) (Oral)    Wt 150 lb 3.2 oz (68.1 kg)    SpO2 95%    BMI 26.19 kg/m   Wt Readings from Last 3 Encounters:  05/26/18 150 lb 3.2 oz (68.1 kg)  04/14/18 151 lb 3.2 oz (68.6 kg)  04/04/18 151 lb (68.5 kg)     Exam: General: Pt appears well and is in NAD  Lungs: Clear with good BS bilat with no rales, rhonchi, or wheezes  Heart: RRR with normal S1 and S2 and no gallops; no murmurs; no rub  Abdomen: Normoactive bowel sounds, soft, nontender, without masses or organomegaly palpable  Extremities: No pretibial edema.  Skin: Normal texture and temperature to palpation. No rash noted. No Acanthosis nigricans/skin tags.  Neuro: MS is good with appropriate affect, pt is alert and Ox3      DATA REVIEWED:  Lab Results  Component Value Date   HGBA1C 7.9 (H) 04/04/2018   HGBA1C 7.8 (H) 10/10/2017   HGBA1C 7.2 (H) 01/02/2017   Lab Results  Component Value Date   MICROALBUR 0.8 01/02/2017   LDLCALC 71 04/04/2018   CREATININE 1.12 04/04/2018   Lab Results  Component Value Date   MICRALBCREAT 1.5 01/02/2017     Lab Results  Component Value Date   CHOL 146 04/04/2018   HDL 56.20 04/04/2018   LDLCALC 71 04/04/2018   TRIG 93.0 04/04/2018   CHOLHDL 3 04/04/2018         ASSESSMENT / PLAN / RECOMMENDATIONS:   1) Type 2 Diabetes Mellitus, Sub-Optimally controlled, With CKD III and neuropathic complications - Most recent A1c of 7.9 %. Goal A1c < 7.5 %.    - Pt has been keeping a great records of her Bg's and the amount of insulin she gives herself.  - Its clear that when her pre-meal BG  is acceptable, she  would skip prandial insulin, which results in post-prandial hyperglycemia with BG's in 200's and 300's. She then takes regular insulin which will cause a steep decrease and puts her at risk for  hypoglycemia.  - Pt also noted to have bedtime hyperglycemia , so she has been adjusting her NPH based on bedtime readings with > 200 point drop overnight, pt would also skip NPH at night  With BG's 155 mg.dL but by the next morning she had a BG of 141 mg/dL. - Discussed the dangers of hypoglycemia to include fatality, and how this risk is increased with guessing on insulin doses, pt strongly discouraged from increasing the insulin dose based on glucose readings.  - She did have a bout of diarrhea over the weekend but that has resolved since.  - She will contact us in 2 weeks to download her freestyle libre - They were urged to contact us with hypoglycemia to make adjustments, and maybe we will be able to stop prandial at some point.    MEDICATIONS:  Increase Trulicity to 1.5 mg   Decrease Novolin- N to 6 units QHS  Decrease Novolin- R to 3 units TIDQAC   EDUCATION / INSTRUCTIONS:  BG monitoring instructions: Patient is instructed to check her blood sugars 4 times a day, before meals and bedtime.  Call [place] [facility] clinic if: BG persistently < 70 or > 300.  I reviewed the Rule of 15 for the treatment of hypoglycemia in detail with the patient. Literature supplied.     F/U in 6 weeks    Signed electronically by: Mack Guise, MD  [facility] End[facility]  [facility] [address] [place], [place] [address] Phone: [phone] FAX: [phone]   CC: Burnard Hawthorne, FNP [address] [place] [address] Phone: [phone]  Fax: [phone]  Return to Endocrinology clinic as below: Future Appointments  Date Time Provider [place]  07/07/2018  1:20 PM Nickie Deren, Melanie Crazier, MD [facility] None

## 2018-05-27 ENCOUNTER — Telehealth: Payer: Self-pay | Admitting: Internal Medicine

## 2018-05-27 NOTE — Telephone Encounter (Signed)
Per [facility], "Caller states she brought in her mom yesterday and reduced insulin to 4 units. It's very hard to read her syringe. Can you get her some 30 mg syringe so her mom can read it."

## 2018-05-28 MED ORDER — "INSULIN SYRINGE-NEEDLE U-100 31G X 5/16"" 0.3 ML MISC": 0 refills | Status: DC

## 2018-05-28 NOTE — Telephone Encounter (Signed)
Spoke with Neomia Glass at [facility] - the lowest insulin syringe is a 30 units with larger print.  Pt is currently using a 50 unit syringe which is smaller print on the syringe.  Per Delane Ginger, the needle needing to be prescribed is a .59mL syringe, 31G  This has been sent in. Spoke with Daughter [name], she is aware.  Nothing further needed.

## 2018-05-29 ENCOUNTER — Other Ambulatory Visit: Payer: Self-pay | Admitting: Internal Medicine

## 2018-05-30 ENCOUNTER — Ambulatory Visit (INDEPENDENT_AMBULATORY_CARE_PROVIDER_SITE_OTHER): Payer: Medicare HMO | Admitting: Family

## 2018-05-30 ENCOUNTER — Ambulatory Visit: Payer: Medicare HMO | Admitting: Family

## 2018-05-30 ENCOUNTER — Other Ambulatory Visit: Payer: Self-pay

## 2018-05-30 ENCOUNTER — Ambulatory Visit: Payer: Self-pay | Admitting: *Deleted

## 2018-05-30 ENCOUNTER — Encounter: Payer: Self-pay | Admitting: Family

## 2018-05-30 ENCOUNTER — Telehealth: Payer: Self-pay

## 2018-05-30 ENCOUNTER — Other Ambulatory Visit: Payer: Self-pay | Admitting: Family

## 2018-05-30 ENCOUNTER — Ambulatory Visit (INDEPENDENT_AMBULATORY_CARE_PROVIDER_SITE_OTHER): Payer: Medicare HMO

## 2018-05-30 DIAGNOSIS — K5900 Constipation, unspecified: Secondary | ICD-10-CM

## 2018-05-30 NOTE — Telephone Encounter (Signed)
Please be aware- wrong protocol for constipation chosen- patient does not have cancer.

## 2018-05-30 NOTE — Telephone Encounter (Signed)
Summary: constipation    Patient's daughter, Amy Calhoun, calling on behalf of patient (not currently with patient) requesting to speak with nurse about constipation. She states that patient has only had one "very small" bowel movement in the past 8 days. She would like some advice on what she needs to do to help with pt.      Left message to call back

## 2018-05-30 NOTE — Patient Instructions (Addendum)
Pleasure speaking with you, always.   Will try MiraLAX, increasing water, walking.   If the next 24 hours patient is not have a bowel movement or certainly develop symptoms of nausea, vomiting, fever, abdominal pain, please go to the nearest emergency room.    Constipation plan  1) Take Miralax once to twice per day until you have a bowel movement. You will end up titrating the use of Miralax. For example, you  may find that using the medication every other day or three times a week is a good bowel regimen for you. Or perhaps, twice weekly.   2)  If you do not get results with Miralax alone, you may then add Bisacodyl suppository daily to regimen until you get desired bowel results. ( May use glycerin  Suppository).   3) Take Colace ( stool softener) twice daily every day.   It is MOST important to drink LOTS of water and follow a HIGH fiber diet to keep foods moving through the gut. You may add Metamucil to a beverage that you drink. Walk walk walk around house -- this is perhaps the best medicine to get things going.   Information on prevention of constipation as well as acute treatment for constipation as included below.  If there is no bowel movement, or if there is any worsening of symptoms, or if you have any additional concerns, please return to this clinic for re-evaluation over the weekend as we discussed.   Constipation Prevention What is Constipation? Constipation is hard, dry bowel movements or the inability to have a bowel movement.  You can also feel like you need to have a bowel movement but not be able to.  It can also be painful when you strain to have a bowel movement.  Taking narcotic pain medicine after surgery can make you constipated, even if you have never had a problem with constipation. What Do I Need To Do? The best thing to do for constipation is to keep it from happening.  This can be done by: Adding laxatives to your daily routine, when taking prescription pain  medicines after surgery. Add 17 gm Miralax daily or 100 mg Colace once or twice daily. (Miralax is mixed in water. Colace is a pill). They soften your bowel movements to make them easier to pass and hurt less. Drink plenty of water to help flush your bowels.  (Eight, 8 ounce glasses daily) Eat foods high in fiber such as whole grains, vegetables, cereals, fruits, and prune juice (5-7 servings a day or 25 grams).  If you do not know how much fiber a food has in it you can look on the label under dietary fiber.  If you have trouble getting enough fiber in your diet you may want to consider a fiber supplement such as Metamucil or Citrucel.  Also, be aware that eating fiber without drinking enough water can make constipation worse. If you do become constipated some medications that may help are: Bisacodyl (Dulcolax) is available in tablet form or a suppository. Glycerin suppositories are also a good choice if you need a fast acting medication. Everybody is different and may have different results.  Talk to your pharmacist or health care provider about your specific problems. They can help you choose the best product for you.  Why Is It Important for Me To Do This? Being constipated is not something you have to live with.  There are many things you can do to help.   Feeling bad can interfere  with your recovery after surgery.  If constipation goes on for too long it can become a very serious medical problem. You may need to visit your doctor or go to the hospital.  That is why it is very important to drink lots of water, eat enough fiber, and keep it from happening.  Ask Questions We want to answer all of your questions and concerns.  Thats why we encourage you to use a program called Ask Me 3, created by the [employer].  By using Ask Me 3 you are encouraged to ask 3 simple (yet, potentially life saving questions) whenever you are talking with your physician, nurse or  pharmacist: What is my main problem? What do I need to do? Why is it important for me to do this? By understanding the answer to these three questions and any other questions you may have, you have the knowledge necessary to manage your health. Please feel very comfortable asking any questions. Healthcare is complicated, so if you hear an answer you do not understand, please ask your health care team to explain again.   Sources: [employer] Med[employer] Plus 11-03-09 N

## 2018-05-30 NOTE — Telephone Encounter (Signed)
Pt's daughter called and states the pt had a virtual visit on 05/23/2018 and the problem was diarrhea, the daughter is a Engineer, civil (consulting) and now she thinks the pt is constipated becaue she had the one blow-out bowel movement and has not had a bowel movement since, the daughter states she does hear bowel sounds, no gas sounds and that the pt was hospitalized once for impaction and she is attempting to avoid this, I informed the daughter to walk in for a xray of abdomen and the order was placed by the provider and a virtual visit was setup for 4pm today in hopes that the results would be back by then.  Jaimy Kliethermes,cma

## 2018-05-30 NOTE — Telephone Encounter (Signed)
Pt has appt today @ 4pm.

## 2018-05-30 NOTE — Progress Notes (Signed)
Verbal consent for services obtained from patient prior to services given.  Location of call:  provider at work patient at home  Names of all persons present for services: Rennie Plowman, NP Chief complaint:  Constipation x 3 days, unchanged.  Accompanied by daughter on phone as well.  Passing gas.   No pain when daughter pushed on stomach. Stomach is soft, not distended.  Drinking Propel water 16 oz , 2-3 glasses per day.  Usually goes every day for BM.  Walking , normal activity.   Suspects changes in diet is contributory as not getting food from [facility] due to COVID.   No response to stool softeners. Tried glycerin suppositories, 3 days ago with 'small to medium amount' of BM. No blood seen in stool. Nor coffee grounds.  Yesterday given miralax and stool softener No abominal cramping. Feels fine.  No nausea, vomiting, fever.   History, background, results pertinent:   H/o DM, CKD  Saw endocrine 05/26/18. Insulin decreased.   A/P/next steps:  Problem List Items Addressed This Visit      Other   Constipation    Discussed limitations of telemedicine.  Patient and daughter report abdomen soft, nondistended , She does not have any pain or fever.  She is passing gas.  Last BM 3 days ago.  We discussed at length titrating MiraLAX, increasing water, walking.  Abdominal x-ray does not show obstruction.  Patient and daughter [name] understand to go to emergency room if patient doesn't have bowel movement in the 24 hours or develops any worsening symptoms including abdominal pain, nausea, vomiting, fever.           I spent 21 min  discussing plan of care over the phone.

## 2018-05-30 NOTE — Telephone Encounter (Signed)
Patient's daughter is calling with concerns that her mother is constipated and she is concerned because she does not want her to get impacted. She has given her a couple doses of Miralax and that has not causes her to have a bowel movement. She would like to know if an X-ray could be done- told patient I was not sure if imaging was in the office yet- but would connect her to the office for appointment if not. Call connected for scheduling to discuss management. Reason for Disposition . Abdomen is more swollen than usual  Answer Assessment - Initial Assessment Questions 1. STOOL PATTERN OR FREQUENCY: "How often do you pass bowel movements (BMs)?"  (Normal range: tid to q 3 days)  "When was your last BM?"       Daily bowel movements 2. STRAINING: "Do you have to strain to have a BM?"      Yes- history of colitis 3. RECTAL PAIN: "Does your rectum hurt when the stool comes out?" If so, ask: "Do you have hemorrhoids? How bad is the pain?"  (Scale 1-10; or mild, moderate, severe)     no 4. STOOL COMPOSITION: "Are the stools hard?"      Patient has loose stools and family thought patient had diarrhea episode- small hard stool on Tueaday 5. BLOOD ON STOOLS: "Has there been any blood on the toilet tissue or on the surface of the BM?" If so, ask: "When was the last time?"      No blood seen- pinkish on Thursday 6. CHRONIC CONSTIPATION: "Is this a new problem for you?"  If no, ask: How long have you had this problem?" (days, weeks, months)      Tuesday- last time patient had stool- minimal bowel sounds- no gas 7. CHANGES IN DIET: "Have there been any recent changes in your diet?"      No changes in diet 8. MEDICATIONS: "Have you been taking any new medications?" "Are you taking any narcotic pain medications?" (e.g., Vicoden, Percocet, morphine, dilaudid)     Tylenol 3- but she is using stool softener 9. LAXATIVES: "Have you been using any laxatives or enemas?"  If yes, ask "What, how often, and when was  the last time?"     miralax 2 doses- not made anything happen 10. CAUSE: "What do you think is causing the constipation?"        Trulicity is the only new medication-endocrine does not think cause 11. CANCER: "What type of cancer do you have?"       No cancer 12. CANCER - TREATMENT: "What cancer treatments have you received?" "When did you last receive them?" (e.g., recent surgery, radiation, or chemotherapy). If triager has access to the patient's medical record, triager should review treatments and administration dates.       n/a 13. CANCER - NEUTROPENIA RISK: "Have you received chemotherapy recently? If Yes, "When was it and what was your last CBC and ANC (absolute neutrophil count)?" "Were you told that your white cell count was low?" If triager has access to the patient's medical record, triager should review most recent labs. An ANC less than 1,000 - 1,500 means that the neutrophils are low and the immune system is weak.       n/a 14. OTHER SYMPTOMS: "Do you have any other symptoms?" (e.g., abdominal pain, fever, vomiting)       No pain- slight bloating  Protocols used: CONSTIPATION-A-AH, CANCER - CONSTIPATION-A-AH

## 2018-06-02 ENCOUNTER — Telehealth: Payer: Self-pay

## 2018-06-02 ENCOUNTER — Encounter: Payer: Self-pay | Admitting: Family

## 2018-06-02 DIAGNOSIS — K5900 Constipation, unspecified: Secondary | ICD-10-CM | POA: Insufficient documentation

## 2018-06-02 NOTE — Telephone Encounter (Signed)
Copied from CRM [id number]. Topic: General - Other >> Jun 02, 2018  9:14 AM Percival Spanish wrote:  Pt daughter [name] would like a call back about pt xray results

## 2018-06-02 NOTE — Assessment & Plan Note (Addendum)
Discussed limitations of telemedicine.  Patient and daughter report abdomen soft, nondistended , She does not have any pain or fever.  She is passing gas.  Last BM 3 days ago.  We discussed at length titrating MiraLAX, increasing water, walking.  Abdominal x-ray does not show obstruction.  Patient and daughter Amy Calhoun understand to go to emergency room if patient doesn't have bowel movement in the 24 hours or develops any worsening symptoms including abdominal pain, nausea, vomiting, fever.

## 2018-06-04 DIAGNOSIS — E1122 Type 2 diabetes mellitus with diabetic chronic kidney disease: Secondary | ICD-10-CM | POA: Diagnosis not present

## 2018-06-04 DIAGNOSIS — E785 Hyperlipidemia, unspecified: Secondary | ICD-10-CM | POA: Diagnosis not present

## 2018-06-04 DIAGNOSIS — K219 Gastro-esophageal reflux disease without esophagitis: Secondary | ICD-10-CM | POA: Diagnosis not present

## 2018-06-04 DIAGNOSIS — E1165 Type 2 diabetes mellitus with hyperglycemia: Secondary | ICD-10-CM | POA: Diagnosis not present

## 2018-06-04 DIAGNOSIS — E1142 Type 2 diabetes mellitus with diabetic polyneuropathy: Secondary | ICD-10-CM | POA: Diagnosis not present

## 2018-06-04 DIAGNOSIS — E039 Hypothyroidism, unspecified: Secondary | ICD-10-CM | POA: Diagnosis not present

## 2018-06-04 DIAGNOSIS — R69 Illness, unspecified: Secondary | ICD-10-CM | POA: Diagnosis not present

## 2018-06-04 DIAGNOSIS — Z794 Long term (current) use of insulin: Secondary | ICD-10-CM | POA: Diagnosis not present

## 2018-06-04 DIAGNOSIS — I209 Angina pectoris, unspecified: Secondary | ICD-10-CM | POA: Diagnosis not present

## 2018-06-04 DIAGNOSIS — G8929 Other chronic pain: Secondary | ICD-10-CM | POA: Diagnosis not present

## 2018-06-05 ENCOUNTER — Telehealth: Payer: Self-pay | Admitting: Gastroenterology

## 2018-06-05 NOTE — Telephone Encounter (Signed)
The pt was scheduled for 5/26 at 9 am telephone visit with Dr Christella Hartigan.

## 2018-06-05 NOTE — Telephone Encounter (Signed)
Pt daughter called in wanting a in offc visit with the provider. She states her mother is having trouble with bm and they have been using multiple otc remedies and have not had any luck.

## 2018-06-06 IMAGING — CR DG LUMBAR SPINE 2-3V
2 series · 2 of 2 positions shown · non-contrast
Comparison: Lumbar spine MRI - 06/20/2016

CLINICAL DATA: L3-L4 laminectomy and foraminotomy

EXAM:
LUMBAR SPINE - 2-3 VIEW

[xtable lateral (1 of 2)]
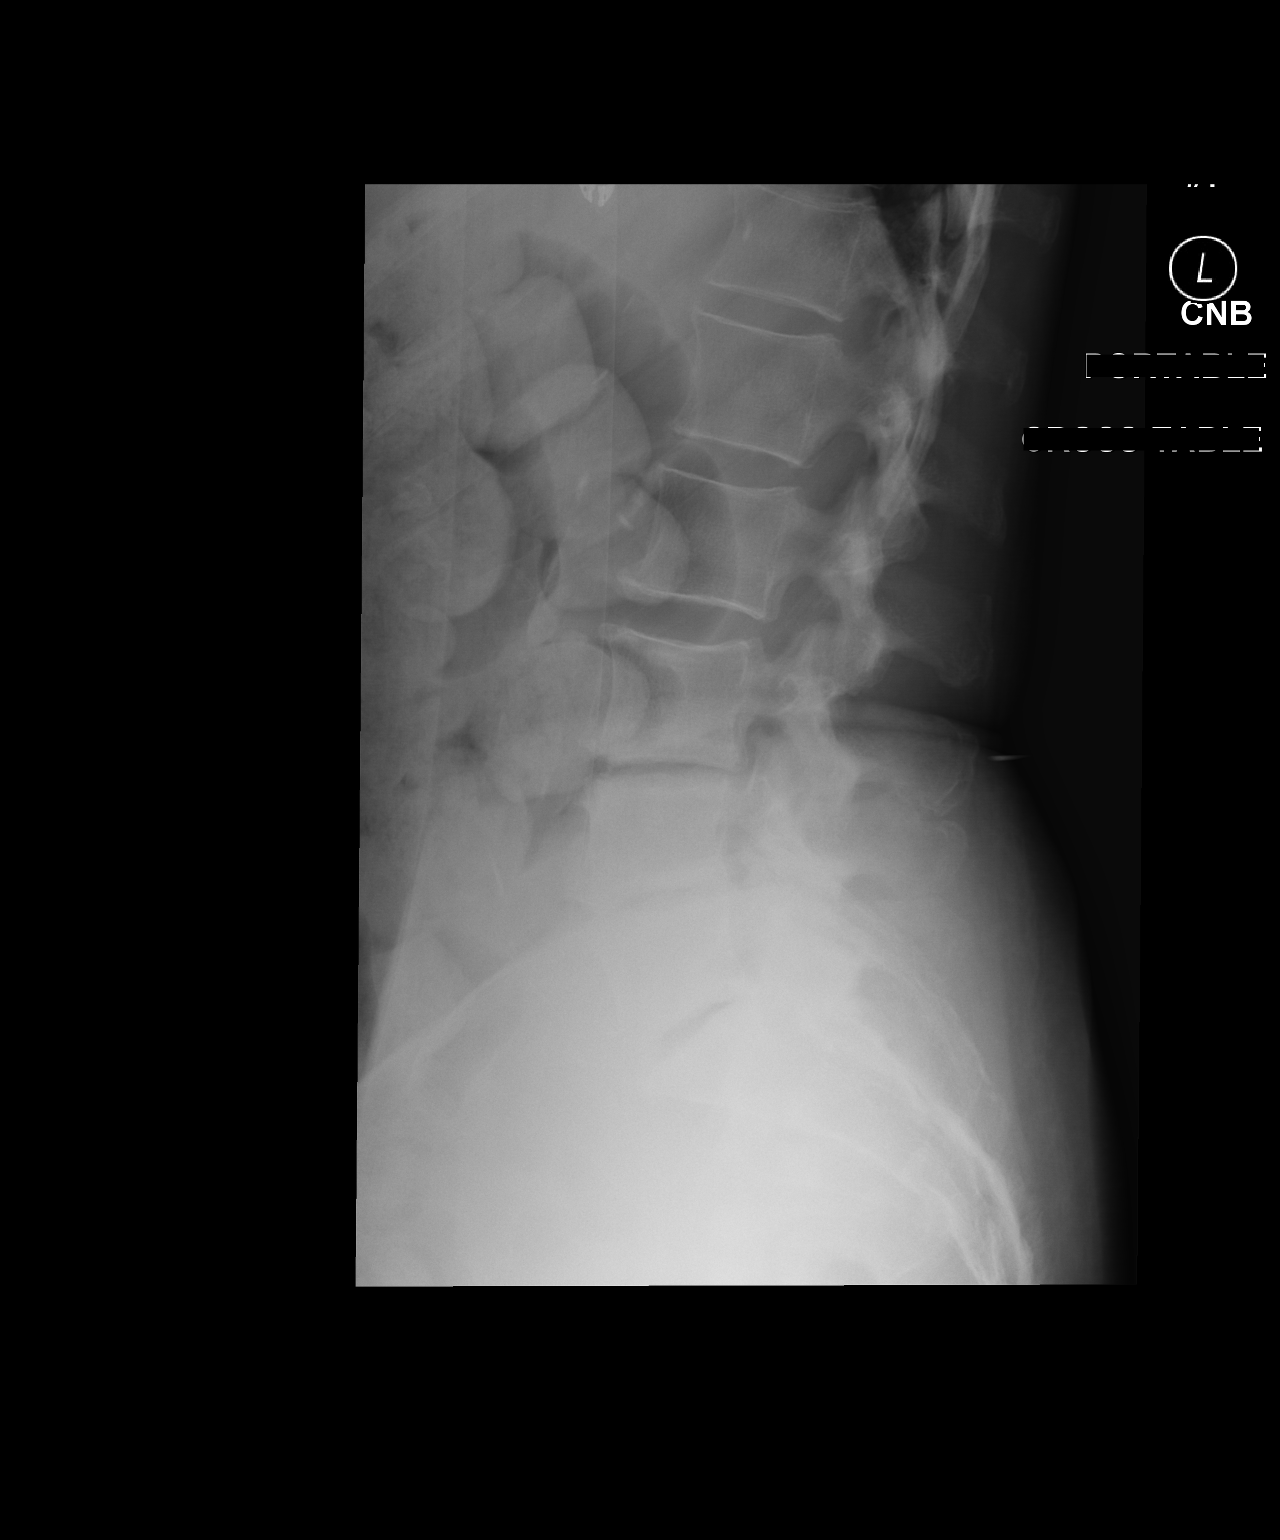

[xtable lateral (2 of 2)]
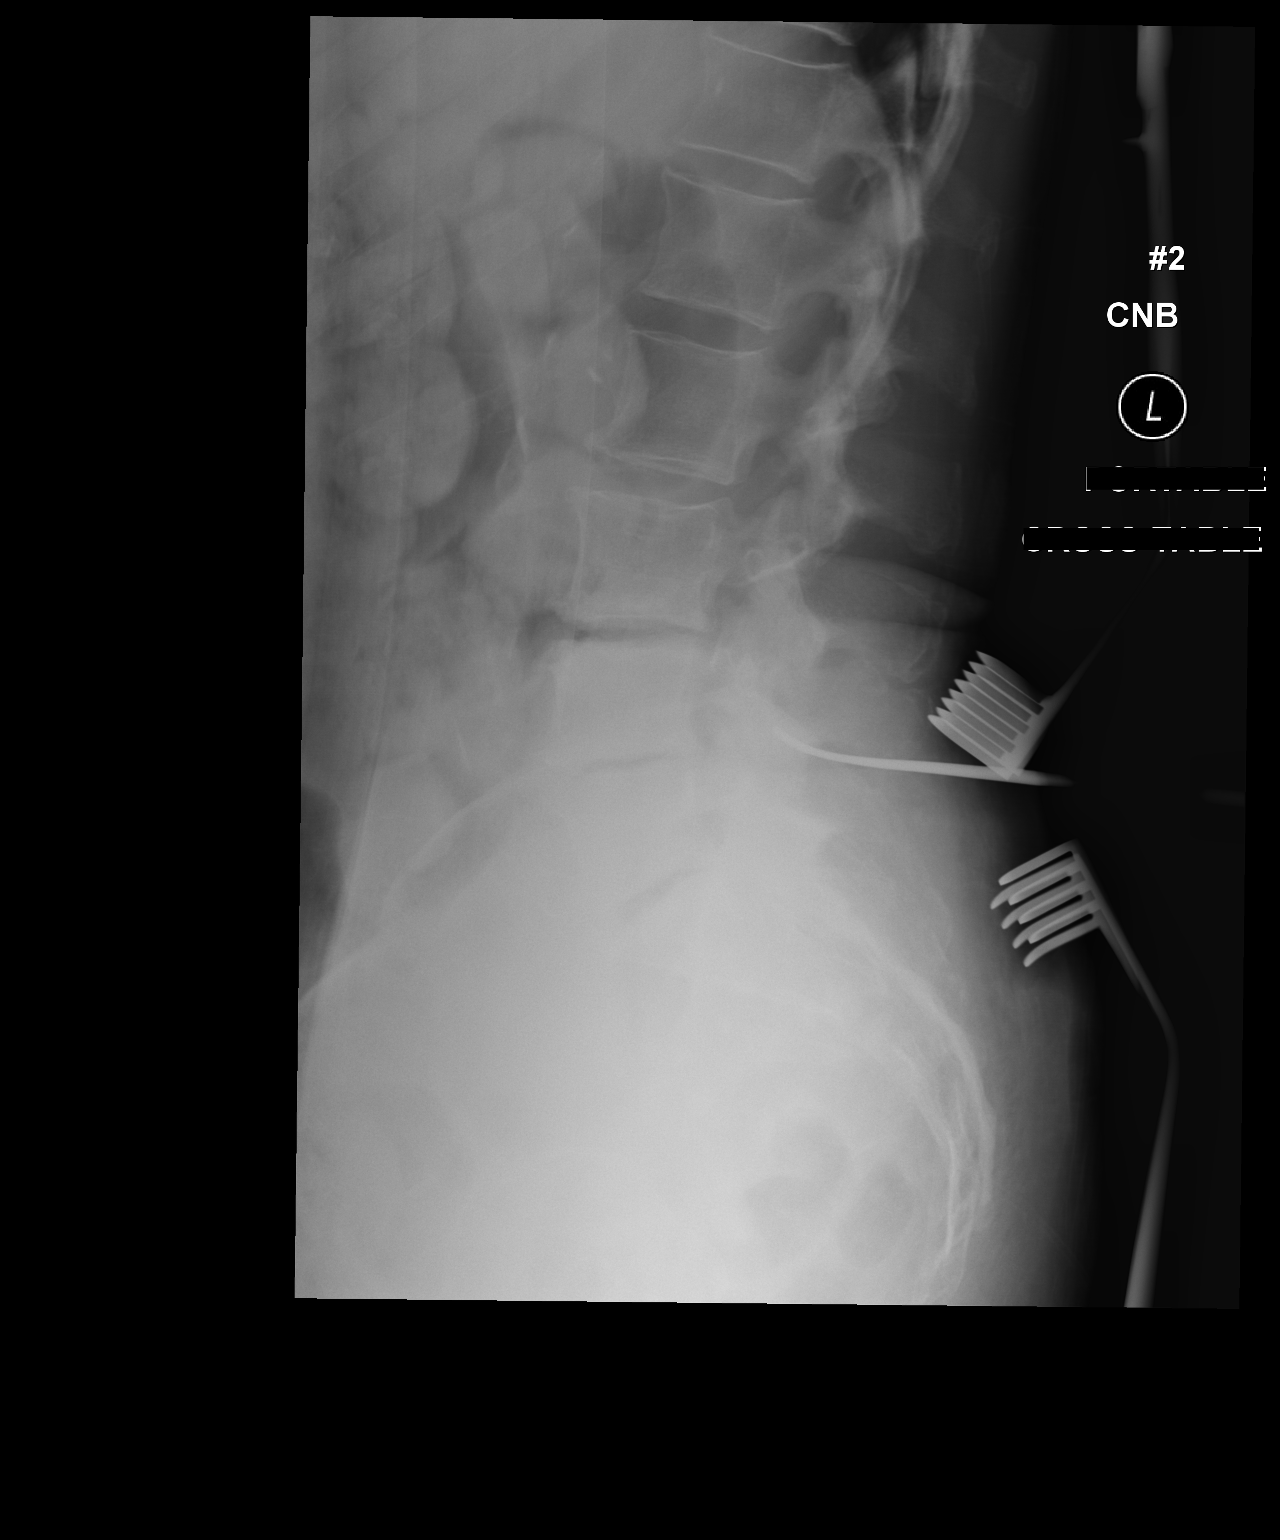

[2 of 2 positions shown; findings below may reference images not displayed]

FINDINGS: 2 spot intraoperative fluoroscopic images of the lower lumbar spine
a provided for review. Spinal labeling is in keeping with
preprocedural lumbar spine MRI.

Radiograph labeled #1 demonstrates a radiopaque needle tip overlying
the soft tissues posterior to the L3-L4 intervertebral disc space.

Radiograph labeled #2 demonstrates a radiopaque marking instrument
overlying the soft tissues posterior to the L4-L5 intervertebral
disc space.

Additional radiopaque surgical support apparatus is noted posterior
to the operative site.
IMPRESSION: Intraoperative spinal localization as above.

## 2018-06-06 NOTE — Progress Notes (Signed)
avs has been mailed

## 2018-06-10 ENCOUNTER — Ambulatory Visit (INDEPENDENT_AMBULATORY_CARE_PROVIDER_SITE_OTHER): Payer: Medicare HMO | Admitting: Gastroenterology

## 2018-06-10 ENCOUNTER — Encounter: Payer: Self-pay | Admitting: Gastroenterology

## 2018-06-10 ENCOUNTER — Telehealth: Payer: Self-pay | Admitting: Internal Medicine

## 2018-06-10 ENCOUNTER — Other Ambulatory Visit: Payer: Self-pay

## 2018-06-10 VITALS — Ht 63.0 in | Wt 150.0 lb

## 2018-06-10 DIAGNOSIS — K5900 Constipation, unspecified: Secondary | ICD-10-CM | POA: Diagnosis not present

## 2018-06-10 DIAGNOSIS — K222 Esophageal obstruction: Secondary | ICD-10-CM

## 2018-06-10 NOTE — Telephone Encounter (Signed)
Call from Si[facility] with [facility] office in [place].   Requesting records of last colonoscopy - advised we do not have record of colonoscopy, but op note from EGD in 2016 was faxed to [phone].

## 2018-06-10 NOTE — Telephone Encounter (Signed)
Patient's Gastroenterology office in [facility] called asking for the patient's last colonoscopy report. Writer advised office last procedure was completed in 2017. Report faxed for continued care.

## 2018-06-10 NOTE — Telephone Encounter (Signed)
Patient requests to be called at ph# [phone]

## 2018-06-10 NOTE — Progress Notes (Signed)
Review of pertinent gastrointestinal problems: 1. Dysphagia, benign GE junction stricture. Established care with [facility] 2017 after move from [place].  EGD 07/30/2016 with a  benign-appearing esophageal stenosis dilated to 20 mm, patient did well for 10 months, now with repeat  symptoms.  Repeat EGD 08/2017 found focal stricture again at GE junction, dilated to 18m. Also small HH.   This service was provided via virtual visit.   Only audio was used.  The patient was located at home.  Her daughter was with her and she did most of the talking I was located in my office. The patient did consent to this virtual visit and is aware of possible charges through their insurance for this visit.  She has been seen here before for dysphasia.  Today she is here for a new complaint.   My certified medical assistant, KGrace Bushy contributed to this visit by contacting the patient by phone 1 or 2 business days prior to the appointment and also followed up on the recommendations I made after the visit.  Time spent on virtual visit: 32 mnin  HPI: This is a very pleasant 85year old woman whom I last saw about a year ago at the time of an upper endoscopy.  I have visited with her via telemedicine today.  Her daughter was with her did almost all of the talking.  Her mother has mild dementia.  She admits that she is having swallowing issues again.  Food hangs or catches about 1-2 per week.  Previous dilations have helped.  Will be 85 in September.  She has been very constipated lately.  She lives in a retirement community.  This facility makes her lunches but her daughter cut off the lunches due to Covid concerns at the facility.  Daughter changed her lunch diet from the facility diet to what sounds like a pasta heavy diet.  Since the diet change she's been very constipated.    Daughter has been giving her glycerin suppositories. Also gave her mag citrate.  Has been giving her OTC stool softners (dulcolax).  Two doses  of miralax every day (tried this for 4 days).  She is in no distress apparent to her daughter.  No weight changes.  No overt blood in her stool.  She gets aloe vera juice (TIW)  "she has a history of a colitis" per the daughter.  It has been 4-5 years since her last colonoscopy.  Prior to this change she would go daily or QOD.  'two things have changed' per her daughter; her diet and new med trulicity.  Takes tylenol #3s in AM (one every AM) has been on these for years.  Not going to the gym like she used to.  Quite a bit more sedentary than she was due to the coronavirus restrictions  Chief complaint is constipation, dysphasia  ROS: complete GI ROS as described in HPI, all other review negative.  Constitutional:  No unintentional weight loss   Past Medical History:  Diagnosis Date  . Chronic kidney disease 08/07/2016   Chronic renal failure, stage III  . Depression   . Esophageal dysphagia   . Gastric ulcer   . GERD (gastroesophageal reflux disease)   . Hyperlipidemia   . Hypertension   . Hypothyroidism   . IDDM (insulin dependent diabetes mellitus) ([place]   . Ischemic colitis ([facility]   . Osteoarthritis   . Ulcer of esophagus     Past Surgical History:  Procedure Laterality Date  . ABDOMINAL HYSTERECTOMY    .  COLONOSCOPY  2010  . ESOPHAGEAL DILATION  08/07/2016   usually a couple times a year, last time 07/30/16  . GALLBLADDER SURGERY    . LUMBAR LAMINECTOMY/DECOMPRESSION MICRODISCECTOMY N/A 08/10/2016   Procedure: BILATERAL HEMILAMINECTOMY LUMBAR THREE-FOUR,LUMBAR FOUR-FIVE,LEFT LUMBAR FIVE-SACRAL ONE HEMILAMINECTOMY AND DECOMPRESSION;  Surgeon: Ashok Pall, MD;  Location: [facility];  Service: Neurosurgery;  Laterality: N/A;  . THYROIDECTOMY      Current Outpatient Medications  Medication Sig Dispense Refill  . acetaminophen-codeine (TYLENOL #3) 300-30 MG tablet Take 1 tablet by mouth daily as needed for moderate pain. 30 tablet 1  . acidophilus (RISAQUAD) CAPS capsule  Take 1 capsule by mouth daily.    Marland Kitchen albuterol (PROVENTIL HFA;VENTOLIN HFA) 108 (90 Base) MCG/ACT inhaler Inhale 2 puffs into the lungs every 6 (six) hours as needed for wheezing or shortness of breath. 1 Inhaler 2  . amitriptyline (ELAVIL) 75 MG tablet TAKE 2 TABLETS AT BEDTIME 180 tablet 4  . BD INSULIN SYRINGE U/F 31G X 5/16" 1 ML MISC USE AS DIRECTED TO ADMINISTER INSULIN 100 each 5  . Blood Glucose Monitoring Suppl (ONETOUCH VERIO) w/Device KIT 1 Device by Does not apply route daily. 1 kit 0  . Cholecalciferol (VITAMIN D) 2000 units tablet Take by mouth.    . Coenzyme Q10 (CO Q 10) 100 MG CAPS Take 1 capsule by mouth daily.    Marland Kitchen CRANBERRY PO Take 1 tablet by mouth every Monday, Wednesday, and Friday.    . docusate sodium (COLACE) 100 MG capsule Take 1 capsule (100 mg total) by mouth 2 (two) times daily. (Patient taking differently: Take 100 mg by mouth every other day. ) 10 capsule 0  . Dulaglutide (TRULICITY) 1.5 OB/0.9GG SOPN Inject 1.5 mg into the skin once a week. 12 pen 3  . gabapentin (NEURONTIN) 100 MG capsule TAKE 2 CAPSULES THREE TIMES A DAY 540 capsule 1  . glucagon (GLUCAGON EMERGENCY) 1 MG injection Inject 1 mg into the vein once as needed. For hypoglycemic episodes. E11.42 1 each 3  . glucose blood (ONETOUCH VERIO) test strip USED TO CHECK BLOOD SUGARS UP TO 5 TIMES A DAY. 200 each 12  . insulin NPH Human (HUMULIN N) 100 UNIT/ML injection Inject 0.06 mLs (6 Units total) into the skin at bedtime. 10 mL 0  . insulin regular (HUMULIN R) 100 units/mL injection Inject 0.03 mLs (3 Units total) into the skin 3 (three) times daily before meals. 10 mL 3  . Insulin Syringe-Needle U-100 (BD INSULIN SYRINGE U/F) 31G X 5/16" 0.3 ML MISC Use four times a day. 4 each 0  . levothyroxine (SYNTHROID, LEVOTHROID) 150 MCG tablet TAKE 1 TABLET DAILY 90 tablet 4  . lidocaine (LIDODERM) 5 % PLACE 1 PATCH ON THE SKIN DAILY 90 patch 1  . lisinopril (PRINIVIL,ZESTRIL) 10 MG tablet TAKE 1 TABLET DAILY 90  tablet 3  . omeprazole (PRILOSEC) 40 MG capsule TAKE 1 CAPSULE DAILY 90 capsule 3  . pyridOXINE (VITAMIN B-6) 100 MG tablet Take 100 mg by mouth daily.    . riboflavin (VITAMIN B-2) 100 MG TABS tablet Take 100 mg by mouth daily.    . simvastatin (ZOCOR) 40 MG tablet Take 1 tablet (40 mg total) by mouth at bedtime. (Patient taking differently: Take 40 mg by mouth 3 (three) times a week. ) 90 tablet 1  . Turmeric 500 MG TABS Take 750 mg by mouth 2 (two) times daily.    . vitamin B-12 (CYANOCOBALAMIN) 500 MCG tablet Take 500 mcg by mouth daily.  Current Facility-Administered Medications  Medication Dose Route Frequency Provider Last Rate Last Dose  . 0.9 %  sodium chloride infusion  500 mL Intravenous Once Milus Banister, MD        Allergies as of 06/10/2018 - Review Complete 05/30/2018  Allergen Reaction Noted  . Pravachol [pravastatin] Other (See Comments) 12/21/2015    Family History  Problem Relation Age of Onset  . Diabetes Mother   . Arthritis Mother   . Hyperlipidemia Mother   . Mental illness Mother   . Heart disease Father   . Mental illness Sister   . Arthritis Maternal Grandmother   . Arthritis Maternal Grandfather   . Colon cancer Neg Hx   . Stomach cancer Neg Hx   . Esophageal cancer Neg Hx     Social History   Socioeconomic History  . Marital status: Widowed    Spouse name: Not on file  . Number of children: Not on file  . Years of education: Not on file  . Highest education level: Not on file  Occupational History  . Occupation: retired  Scientific laboratory technician  . Financial resource strain: Not hard at all  . Food insecurity:    Worry: Never true    Inability: Never true  . Transportation needs:    Medical: No    Non-medical: No  Tobacco Use  . Smoking status: Never Smoker  . Smokeless tobacco: Never Used  Substance and Sexual Activity  . Alcohol use: No  . Drug use: No  . Sexual activity: Never    Partners: Male  Lifestyle  . Physical activity:     Days per week: 3 days    Minutes per session: 50 min  . Stress: Not on file  Relationships  . Social connections:    Talks on phone: Not on file    Gets together: Not on file    Attends religious service: Not on file    Active member of club or organization: Not on file    Attends meetings of clubs or organizations: Not on file    Relationship status: Not on file  . Intimate partner violence:    Fear of current or ex partner: No    Emotionally abused: No    Physically abused: No    Forced sexual activity: No  Other Topics Concern  . Not on file  Social History Narrative  . Not on file     Physical Exam: Unable to perform because this was a "telemed visit" due to current Covid-19 pandemic  Assessment and plan: 85 y.o. female with constipation, dysphasia  First she has had change in her bowels, fairly significant constipation in the past month or so.  This might be diet related, it might be lack of exercise related, she does take 1 codeine-containing pain medicine every morning but this is been going on for years.  I recommended Gatorade, MiraLAX purge and that she then start MiraLAX 2 doses every single day.  She will start fiber supplements.  Glycerin suppository can be used on a as needed basis.  We will try to track down records from her most recent colonoscopy in [place].  Her daughter thinks it was 4 to 7 years ago.  Depending on the timing of that and the findings she might need a repeat colonoscopy now.  Either way she needs an upper endoscopy in the relatively near future given her recurrent dysphasia likely related to peptic stricture.  I would like to do that at the  same time of colonoscopy if she needs 1 and so we will wait for [facility] records to determine the timing of the procedures.  Please see the "Patient Instructions" section for addition details about the plan.  Owens Loffler, MD [facility] 06/10/2018, 8:11 AM

## 2018-06-10 NOTE — Telephone Encounter (Signed)
Please contact pt daughter, [name], in reference to [employer]. I have already sent invitation to her e-mail.

## 2018-06-10 NOTE — Patient Instructions (Addendum)
She will undergo a MiraLAX, Gatorade purge today  She will then start 2 doses of MiraLAX every single morning starting tomorrow  She will also start 1 dose of Citrucel powder fiber supplement tomorrow  Glycerin suppository can be used as rescue as needed.  We will help find records from her [place] most recent colonoscopy after speaking with her daughter this morning.  Depending on the timing of her last colonoscopy she might need another one now especially if she does not respond to the above bowel regimen.  At the same time she will need an upper endoscopy for recurrent dysphasia likely related to a peptic stricture.  Dr Christella Hartigan recommends that you complete a bowel purge (to clean out your bowels). Please do the following: Purchase a bottle of Miralax over the counter as well as a box of 5 mg dulcolax tablets. Take 4 dulcolax tablets. Wait 1 hour. You will then drink 6-8 capfuls of Miralax mixed in an adequate amount of water/juice/gatorade (you may choose which of these liquids to drink) over the next 2-3 hours. You should expect results within 1 to 6 hours after completing the bowel purge.

## 2018-06-11 ENCOUNTER — Telehealth: Payer: Self-pay | Admitting: Gastroenterology

## 2018-06-11 ENCOUNTER — Other Ambulatory Visit: Payer: Self-pay | Admitting: Gastroenterology

## 2018-06-11 DIAGNOSIS — R1310 Dysphagia, unspecified: Secondary | ICD-10-CM

## 2018-06-11 DIAGNOSIS — K222 Esophageal obstruction: Secondary | ICD-10-CM

## 2018-06-11 DIAGNOSIS — R198 Other specified symptoms and signs involving the digestive system and abdomen: Secondary | ICD-10-CM

## 2018-06-11 NOTE — Telephone Encounter (Signed)
Spoke to patient daughter [name]. Patient has been scheduled for an EGD/Colonoscopy on 07/08/18. She will come in tomorrow to go over instructions and pick up Suprep kit.Daughter states the Go lightly prep is to high of a volume and patient would never be able to drink all of it.

## 2018-06-11 NOTE — Telephone Encounter (Signed)
Okay, thanks.  It looks like she has not had a colonoscopy in at least quite a long time.  She has new bowel changes.  She needs a colonoscopy for her bowel changes and same-day upper endoscopy for dysphasia, history of stricture. [facility] should be appropriate.

## 2018-06-11 NOTE — Telephone Encounter (Signed)
-----   Message from Dossie Arbour, LPN sent at 6/81/2751 12:07 PM EDT ----- Regarding: Records Dr Christella Hartigan,  I reached out to the GI office in [place] and spoke with Dr Marquette Old nurse. She reviewed all of patients records dating back to 2016. The nurse said she was scheduled for a colonoscopy, but it was never done. I called her PCP as well and they stated the same information. They were only able to pull up EGD reports. The daughter said if she needs an EGD then she would like patient to have a colonoscopy done at the same time. Please advise.  Thanks [employer]

## 2018-06-13 ENCOUNTER — Other Ambulatory Visit: Payer: Self-pay | Admitting: Internal Medicine

## 2018-06-13 ENCOUNTER — Other Ambulatory Visit: Payer: Self-pay | Admitting: Gastroenterology

## 2018-06-13 NOTE — Progress Notes (Signed)
Spoke to daughter [name]    Discussed freestyle Josephine Igo down load   Mother would not take NPH-N is BG is in the 100's. Interestingly enough, her BG's don't spike as high   06/10/2018 Bedtime reading of 125 mg/dL  08/24/1749  Fasting reading  of  154 mg/dL    I explained to the daughter, pt with hx of recurrent hypoglycemia, they have fear of it and if the patient is not comfortable taking novolin-N , its ok to stop it  She just started the Trulicity 1.5 mg weekly, 2 weeks ago    Recommendations Stop Novolin-N  Increase Novolin-R 4 units TID QAC  Continue trulicity 1.5 mg weekly     Abby Raelyn Mora, MD  [facility]  [facility] [address] [place], [place] [address] Phone: [phone] FAX: [phone]

## 2018-06-16 DIAGNOSIS — R69 Illness, unspecified: Secondary | ICD-10-CM | POA: Diagnosis not present

## 2018-06-18 ENCOUNTER — Other Ambulatory Visit: Payer: Self-pay | Admitting: Family

## 2018-06-18 DIAGNOSIS — M1612 Unilateral primary osteoarthritis, left hip: Secondary | ICD-10-CM

## 2018-06-19 DIAGNOSIS — E1142 Type 2 diabetes mellitus with diabetic polyneuropathy: Secondary | ICD-10-CM | POA: Diagnosis not present

## 2018-06-20 NOTE — Telephone Encounter (Signed)
Patient is checking on medication . [id number] [address] [address], [place] - [id number] [phone] (Phone) [phone] (Fax)

## 2018-06-23 ENCOUNTER — Other Ambulatory Visit: Payer: Self-pay

## 2018-06-23 DIAGNOSIS — M1612 Unilateral primary osteoarthritis, left hip: Secondary | ICD-10-CM

## 2018-06-23 MED ORDER — ACETAMINOPHEN-CODEINE #3 300-30 MG PO TABS: 1.0000 | ORAL_TABLET | Freq: Every day | ORAL | 1 refills | Status: DC | PRN

## 2018-06-23 NOTE — Telephone Encounter (Signed)
Left message with patient's daughter that medication refill was sent to pharmacy.  Nina,cma

## 2018-06-23 NOTE — Telephone Encounter (Signed)
Call pt  Let her know I refilled tylenol 3 [facility] so please advise that she be only one to open mail and the medication is stored safely since mail order.

## 2018-06-25 ENCOUNTER — Telehealth: Payer: Self-pay | Admitting: *Deleted

## 2018-06-25 NOTE — Telephone Encounter (Signed)
Amy Calhoun return your call regarding the patient.

## 2018-06-25 NOTE — Telephone Encounter (Signed)
LM on VM to call back for screening questions. 

## 2018-06-25 NOTE — Telephone Encounter (Signed)
Covid-19 screening questions  Have you traveled in the last 14 days? no If yes where?  Do you now or have you had a fever in the last 14 days? no  Do you have any respiratory symptoms of shortness of breath or cough now or in the last 14 days? no  Do you have any family members or close contacts with diagnosed or suspected Covid-19 in the past 14 days? no  Have you been tested for Covid-19 and found to be positive? no       

## 2018-06-27 ENCOUNTER — Encounter: Payer: Self-pay | Admitting: Gastroenterology

## 2018-06-27 ENCOUNTER — Other Ambulatory Visit: Payer: Self-pay

## 2018-06-27 ENCOUNTER — Ambulatory Visit (AMBULATORY_SURGERY_CENTER): Payer: Medicare HMO | Admitting: Gastroenterology

## 2018-06-27 VITALS — BP 145/67 | HR 84 | Temp 98.5°F | Resp 13 | Ht 63.0 in | Wt 150.0 lb

## 2018-06-27 DIAGNOSIS — R195 Other fecal abnormalities: Secondary | ICD-10-CM | POA: Diagnosis not present

## 2018-06-27 DIAGNOSIS — K222 Esophageal obstruction: Secondary | ICD-10-CM | POA: Diagnosis not present

## 2018-06-27 DIAGNOSIS — K598 Other specified functional intestinal disorders: Secondary | ICD-10-CM | POA: Diagnosis not present

## 2018-06-27 DIAGNOSIS — K529 Noninfective gastroenteritis and colitis, unspecified: Secondary | ICD-10-CM

## 2018-06-27 DIAGNOSIS — K635 Polyp of colon: Secondary | ICD-10-CM | POA: Diagnosis not present

## 2018-06-27 DIAGNOSIS — R198 Other specified symptoms and signs involving the digestive system and abdomen: Secondary | ICD-10-CM

## 2018-06-27 DIAGNOSIS — R194 Change in bowel habit: Secondary | ICD-10-CM | POA: Diagnosis not present

## 2018-06-27 DIAGNOSIS — K219 Gastro-esophageal reflux disease without esophagitis: Secondary | ICD-10-CM | POA: Diagnosis not present

## 2018-06-27 DIAGNOSIS — R1310 Dysphagia, unspecified: Secondary | ICD-10-CM

## 2018-06-27 DIAGNOSIS — I129 Hypertensive chronic kidney disease with stage 1 through stage 4 chronic kidney disease, or unspecified chronic kidney disease: Secondary | ICD-10-CM | POA: Diagnosis not present

## 2018-06-27 DIAGNOSIS — N183 Chronic kidney disease, stage 3 (moderate): Secondary | ICD-10-CM | POA: Diagnosis not present

## 2018-06-27 DIAGNOSIS — E1122 Type 2 diabetes mellitus with diabetic chronic kidney disease: Secondary | ICD-10-CM | POA: Diagnosis not present

## 2018-06-27 MED ORDER — SODIUM CHLORIDE 0.9 % IV SOLN: 500.0000 mL | Freq: Once | INTRAVENOUS | Status: DC

## 2018-06-27 MED ORDER — OMEPRAZOLE 40 MG PO CPDR: 40.0000 mg | DELAYED_RELEASE_CAPSULE | Freq: Two times a day (BID) | ORAL | 3 refills | Status: DC

## 2018-06-27 NOTE — Patient Instructions (Signed)
YOU HAD AN ENDOSCOPIC PROCEDURE TODAY AT THE [facility]:   Refer to the procedure report that was given to you for any specific questions about what was found during the examination.  If the procedure report does not answer your questions, please call your gastroenterologist to clarify.  If you requested that your care partner not be given the details of your procedure findings, then the procedure report has been included in a sealed envelope for you to review at your convenience later.  YOU SHOULD EXPECT: Some feelings of bloating in the abdomen. Passage of more gas than usual.  Walking can help get rid of the air that was put into your GI tract during the procedure and reduce the bloating. If you had a lower endoscopy (such as a colonoscopy or flexible sigmoidoscopy) you may notice spotting of blood in your stool or on the toilet paper. If you underwent a bowel prep for your procedure, you may not have a normal bowel movement for a few days.  Please Note:  You might notice some irritation and congestion in your nose or some drainage.  This is from the oxygen used during your procedure.  There is no need for concern and it should clear up in a day or so.  SYMPTOMS TO REPORT IMMEDIATELY:   Following lower endoscopy (colonoscopy or flexible sigmoidoscopy):  Excessive amounts of blood in the stool  Significant tenderness or worsening of abdominal pains  Swelling of the abdomen that is new, acute  Fever of 100F or higher   Following upper endoscopy (EGD)  Vomiting of blood or coffee ground material  New chest pain or pain under the shoulder blades  Painful or persistently difficult swallowing  New shortness of breath  Fever of 100F or higher  Black, tarry-looking stools  For urgent or emergent issues, a gastroenterologist can be reached at any hour by calling [phone].   DIET:  We do recommend a small meal at first, but then you may proceed to your regular diet.  Drink  plenty of fluids but you should avoid alcoholic beverages for 24 hours.  ACTIVITY:  You should plan to take it easy for the rest of today and you should NOT DRIVE or use heavy machinery until tomorrow (because of the sedation medicines used during the test).    FOLLOW UP: Our staff will call the number listed on your records 48-72 hours following your procedure to check on you and address any questions or concerns that you may have regarding the information given to you following your procedure. If we do not reach you, we will leave a message.  We will attempt to reach you two times.  During this call, we will ask if you have developed any symptoms of COVID 19. If you develop any symptoms (ie: fever, flu-like symptoms, shortness of breath, cough etc.) before then, please call [phone].  If you test positive for Covid 19 in the 2 weeks post procedure, please call and report this information to us.    If any biopsies were taken you will be contacted by phone or by letter within the next 1-3 weeks.  Please call us at [phone] if you have not heard about the biopsies in 3 weeks.    SIGNATURES/CONFIDENTIALITY: You and/or your care partner have signed paperwork which will be entered into your electronic medical record.  These signatures attest to the fact that that the information above on your After Visit Summary has been reviewed and is   understood.  Full responsibility of the confidentiality of this discharge information lies with you and/or your care-partner. 

## 2018-06-27 NOTE — Progress Notes (Signed)
Report to PACU, RN, vss, BBS= Clear.  

## 2018-06-27 NOTE — Op Note (Signed)
Amy Calhoun: Amy Calhoun Procedure Date: 06/27/2018 1:22 PM MRN: [id number] Endoscopist: Milus Banister , MD Age: 85 Referring MD:  Date of Birth: [birth year] Gender: Female Account #: [id number] Procedure:                Upper GI endoscopy Indications:              Dysphagia; Dysphagia, benign GE junction stricture.                            Established care with [facility] 2017 after move                            from [place]. EGD 07/30/2016 with a benign-appearing                            esophageal stenosis dilated to 31mm, patient did                            well for 10 months, now with repeat symptoms.                            Repeat EGD 08/2017 found focal stricture again at GE                            junction, dilated to 63mm. Also small HH Medicines:                Monitored Anesthesia Care Procedure:                Pre-Anesthesia Assessment:                           - Prior to the procedure, a History and Physical                            was performed, and patient medications and                            allergies were reviewed. The patient's tolerance of                            previous anesthesia was also reviewed. The risks                            and benefits of the procedure and the sedation                            options and risks were discussed with the patient.                            All questions were answered, and informed consent                            was obtained. Prior Anticoagulants: The patient has  taken no previous anticoagulant or antiplatelet                            agents. ASA Grade Assessment: III - A patient with                            severe systemic disease. After reviewing the risks                            and benefits, the patient was deemed in                            satisfactory condition to undergo the procedure.                           After  obtaining informed consent, the endoscope was                            passed under direct vision. Throughout the                            procedure, the patient's blood pressure, pulse, and                            oxygen saturations were monitored continuously. The                            Endoscope was introduced through the mouth, and                            advanced to the second part of duodenum. Scope In: Scope Out: Findings:                 One benign-appearing, intrinsic moderate stenosis                            was found in the mid esophagus. The stenosis                            slightly impeded adult gastroscope passage. Passing                            the scope with minor resistence caused minor                            bleeding, dilation of the stenosis. A TTS dilator                            was passed through the scope. Dilation with an                            11-27-11 mm balloon dilator was performed to 13 mm.  The exam was otherwise without abnormality. Complications:            No immediate complications. Estimated blood loss:                            None. Estimated Blood Loss:     Estimated blood loss: none. Impression:               - Benign-appearing esophageal stenosis. Dilated                            with passage of the gastroscope and then balloon                            dilation to 13mm. This was more significant than ws                            noted 1 and 2 years ago by EGD.                           - The examination was otherwise normal.                           - No specimens collected. Recommendation:           - Patient has a contact number available for                            emergencies. The signs and symptoms of potential                            delayed complications were discussed with the                            patient. Return to normal activities tomorrow.                             Written discharge instructions were provided to the                            patient.                           - Resume previous diet.                           - Continue present medications. Please increase                            your omeprazole to 40mg  TWICE daily dosing. New                            script written today (40mg  pills, on pill BID, disp                            180 with 3 refills). Best to take  these shortly                            before eating. Chew your food well, eat slowly and                            take small bites. Use both uppers and lowers when                            you eat. Rachael Feeaniel P Ludia Gartland, MD 06/27/2018 2:03:00 PM This report has been signed electronically.

## 2018-06-27 NOTE — Progress Notes (Signed)
Called to room to assist during endoscopic procedure.  Patient ID and intended procedure confirmed with present staff. Received instructions for my participation in the procedure from the performing physician.  

## 2018-06-27 NOTE — Op Note (Signed)
[facility] Patient Name: Amy MuirRosetta Fielden Procedure Date: 06/27/2018 1:22 PM MRN: [id number] Endoscopist: Rachael Feeaniel P Unique Sillas , MD Age: 85 Referring MD:  Date of Birth: 11/04/1928 Gender: Female Account #: [id number] Procedure:                Colonoscopy Indications:              Change in bowel habits, constipation. She was told                            she had a colitis many years ago, different state Medicines:                Monitored Anesthesia Care Procedure:                Pre-Anesthesia Assessment:                           - Prior to the procedure, a History and Physical                            was performed, and patient medications and                            allergies were reviewed. The patient's tolerance of                            previous anesthesia was also reviewed. The risks                            and benefits of the procedure and the sedation                            options and risks were discussed with the patient.                            All questions were answered, and informed consent                            was obtained. Prior Anticoagulants: The patient has                            taken no previous anticoagulant or antiplatelet                            agents. ASA Grade Assessment: III - A patient with                            severe systemic disease. After reviewing the risks                            and benefits, the patient was deemed in                            satisfactory condition to undergo the procedure.  After obtaining informed consent, the colonoscope                            was passed under direct vision. Throughout the                            procedure, the patient's blood pressure, pulse, and                            oxygen saturations were monitored continuously. The                            Colonoscope was introduced through the anus and                            advanced  to the the cecum, identified by                            appendiceal orifice and ileocecal valve. The                            colonoscopy was performed without difficulty. The                            patient tolerated the procedure well. The quality                            of the bowel preparation was good. The ileocecal                            valve, appendiceal orifice, and rectum were                            photographed. Scope In: 1:26:33 PM Scope Out: 1:44:25 PM Scope Withdrawal Time: 0 hours 9 minutes 26 seconds  Total Procedure Duration: 0 hours 17 minutes 52 seconds  Findings:                 There was significant tortuosity and mild patchy                            erythema in the left colon (from the rectum up to                            about 50cm). Multiple biopsies were taken.                           The exam was otherwise without abnormality on                            direct and retroflexion views. Complications:            No immediate complications. Estimated blood loss:  None. Estimated Blood Loss:     Estimated blood loss: none. Impression:               - There was significant tortuosity and mild patchy                            erythema in the left colon (from the rectum up to                            about 50cm). Multiple biopsies were taken.                           - The examination was otherwise normal on direct                            and retroflexion views.                           - No polyps or cancers. Recommendation:           - Patient has a contact number available for                            emergencies. The signs and symptoms of potential                            delayed complications were discussed with the                            patient. Return to normal activities tomorrow.                            Written discharge instructions were provided to the                             patient.                           - Resume previous diet.                           - Continue present medications. Milus Banister, MD 06/27/2018 1:55:40 PM This report has been signed electronically.

## 2018-06-27 NOTE — Progress Notes (Signed)
Amy Calhoun Amy Calhoun Temp [place]

## 2018-07-01 ENCOUNTER — Telehealth: Payer: Self-pay

## 2018-07-01 NOTE — Telephone Encounter (Signed)
1. Have you developed a fever since your procedure? No.  2.   Have you had an respiratory symptoms (SOB or cough) since your procedure? No.  3.   Have you tested positive for COVID 19 since your procedure No.  4.   Have you had any family members/close contacts diagnosed with the COVID 19 since your procedure?  No.  If yes to any of these questions please route to Joylene John, RN and Alphonsa Gin, RN.  Follow up Call-  Call back number 06/27/2018 08/16/2017 07/30/2016  Post procedure Call Back phone  # [phone]  Permission to leave phone message Yes Yes Yes  Some recent data might be hidden     Patient questions:  Do you have a fever, pain , or abdominal swelling? No. Pain Score  0 *  Have you tolerated food without any problems? Yes.    Have you been able to return to your normal activities? Yes.    Do you have any questions about your discharge instructions: Diet   No. Medications  No. Follow up visit  No.  Do you have questions or concerns about your Care? No.  Spoke to pt.'s daughter.  Actions: * If pain score is 4 or above: No action needed, pain <4.

## 2018-07-07 ENCOUNTER — Encounter: Payer: Self-pay | Admitting: Family

## 2018-07-07 ENCOUNTER — Telehealth: Payer: Self-pay | Admitting: Family

## 2018-07-07 ENCOUNTER — Ambulatory Visit (INDEPENDENT_AMBULATORY_CARE_PROVIDER_SITE_OTHER): Payer: Medicare HMO | Admitting: Internal Medicine

## 2018-07-07 ENCOUNTER — Other Ambulatory Visit: Payer: Self-pay

## 2018-07-07 ENCOUNTER — Encounter: Payer: Self-pay | Admitting: Internal Medicine

## 2018-07-07 DIAGNOSIS — E1142 Type 2 diabetes mellitus with diabetic polyneuropathy: Secondary | ICD-10-CM | POA: Diagnosis not present

## 2018-07-07 NOTE — Progress Notes (Signed)
Virtual Visit via Video Note  I connected with [place] on 07/07/18 at 1:20 pm by a video enabled telemedicine application and verified that I am speaking with the correct person using two identifiers.   I discussed the limitations of evaluation and management by telemedicine and the availability of in person appointments. The patient expressed understanding and agreed to proceed.  -Location of the patient : Home  -Location of the provider : office -The names of all persons participating in the telemedicine service : Pt and myself and daughter [name]            Name: Amy Calhoun  Age/ Sex: 85 y.o., female   MRN/ DOB: [id number], September 01, 1928     PCP: Burnard Hawthorne, FNP   Reason for Endocrinology Evaluation: Type 2 Diabetes Mellitus  Initial Endocrine Consultative Visit: 04/15/2018    PATIENT IDENTIFIER: Amy Calhoun is a 85 y.o. female with a past medical history of T2DM, Dyslipidemia, Hypothyroidism and HTN . The patient has followed with Endocrinology clinic since 04/15/2018 for consultative assistance with management of her diabetes.   Patient used to be cared for by her other daughter in [place] but daughter had passed due to a motorcycle accident and [name] brought mother to [facility] in 2017.   DIABETIC HISTORY:  Amy Calhoun was diagnosed with DM at age 20. She has been on insulin since her diagnosis.She was briefly on glipizide but this was stopped in March, 2020. Pt did not recall any other oral glycemic agents. Her hemoglobin A1c has ranged from 6.5 % in 2018, peaking at 8.9%  In 2018.  Daughter would like to explore putting mother on glycemic agents, she is concerned about having MDI regimen as mother gets older and worried about future dexterity.   On her initial visit to our clinic, she was on Humulin-N BID, Humulin-R per SS and Tradjenta.  We stopped Tradjenta, started Trulicity in March, 6203, with reduction in insulin doses.   SUBJECTIVE:   During  the last visit (05/26/2018): A1c was 7.9%. We increased Trulicity, decreased Novolin-N and Novolin-R     Today (07/07/2018): Amy Calhoun is here with daughter [name] for a 6 week follow up appointment on BG's.Since her last visit to our clinic, I received a call from daughter stating mother would not use the Novolin-N at bedtime if glucose is < 100 mg/dL. She was advised to hold NPH , if mother is not comfortable,  and increased Novolin-R and continued Trulicity.   She checks her blood sugars 5-4 times daily, preprandial and bedtime. The patient has not had hypoglycemic episodes since the last clinic visit. The patient is not symptomatic with these episodes. Otherwise, the patient has not required any recent emergency interventions for hypoglycemia and has not had recent hospitalizations secondary to hyper or hypoglycemic episodes.   She went to the beach for a vacation , and daughter admits, mother has been eating sweets, drinking sugar-sweetened beverages and a lot of times would eat without prandial and will check glucose after she eats.    ROS: As per HPI and as detailed below: Review of Systems  Constitutional: Negative for chills and fever.  Respiratory: Negative for cough and shortness of breath.   Cardiovascular: Negative for chest pain and palpitations.  Gastrointestinal: Negative for diarrhea and nausea.      HOME DIABETES REGIMEN:   Trulicity 1.5 mg weekly  Humulin-R 4 units TID QAC  Humulin-N 6 units QHS     GLUCOSE  LOG : 160-300's mg/dL (post-PRANDIAL)   HISTORY:  Past Medical History:  Past Medical History:  Diagnosis Date  . Chronic kidney disease 08/07/2016   Chronic renal failure, stage III  . Depression   . Esophageal dysphagia   . Gastric ulcer   . GERD (gastroesophageal reflux disease)   . Hyperlipidemia   . Hypertension   . Hypothyroidism   . IDDM (insulin dependent diabetes mellitus) ([facility])   . Ischemic colitis ([place])   . Osteoarthritis   . Ulcer of  esophagus    Past Surgical History:  Past Surgical History:  Procedure Laterality Date  . ABDOMINAL HYSTERECTOMY    . COLONOSCOPY  2010  . ESOPHAGEAL DILATION  08/07/2016   usually a couple times a year, last time 07/30/16  . GALLBLADDER SURGERY    . LUMBAR LAMINECTOMY/DECOMPRESSION MICRODISCECTOMY N/A 08/10/2016   Procedure: BILATERAL HEMILAMINECTOMY LUMBAR THREE-FOUR,LUMBAR FOUR-FIVE,LEFT LUMBAR FIVE-SACRAL ONE HEMILAMINECTOMY AND DECOMPRESSION;  Surgeon: Ashok Pall, MD;  Location: [facility];  Service: Neurosurgery;  Laterality: N/A;  . THYROIDECTOMY      Social History:  reports that she has never smoked. She has never used smokeless tobacco. She reports that she does not drink alcohol or use drugs. Family History:  Family History  Problem Relation Age of Onset  . Diabetes Mother   . Arthritis Mother   . Hyperlipidemia Mother   . Mental illness Mother   . Heart disease Father   . Mental illness Sister   . Arthritis Maternal Grandmother   . Arthritis Maternal Grandfather   . Colon cancer Neg Hx   . Stomach cancer Neg Hx   . Esophageal cancer Neg Hx      HOME MEDICATIONS: Allergies as of 07/07/2018      Reactions   Pravachol [pravastatin] Other (See Comments)   States she refused due to side effects      Medication List       Accurate as of July 07, 2018  2:43 PM. If you have any questions, ask your nurse or doctor.        STOP taking these medications   glucose blood test strip Commonly known as: Civil engineer, contracting Stopped by: Dorita Sciara, MD   OneTouch Verio w/Device Kit Stopped by: Dorita Sciara, MD     TAKE these medications   acetaminophen-codeine 300-30 MG tablet Commonly known as: TYLENOL #3 Take 1 tablet by mouth daily as needed for moderate pain.   acidophilus Caps capsule Take 1 capsule by mouth daily.   albuterol 108 (90 Base) MCG/ACT inhaler Commonly known as: VENTOLIN HFA Inhale 2 puffs into the lungs every 6 (six) hours as  needed for wheezing or shortness of breath.   amitriptyline 75 MG tablet Commonly known as: ELAVIL TAKE 2 TABLETS AT BEDTIME   BD Insulin Syringe U/F 31G X 5/16" 1 ML Misc Generic drug: Insulin Syringe-Needle U-100 USE AS DIRECTED TO ADMINISTER INSULIN   Insulin Syringe-Needle U-100 31G X 5/16" 0.3 ML Misc Commonly known as: BD Insulin Syringe U/F Use four times a day.   Co Q 10 100 MG Caps Take 1 capsule by mouth daily.   CRANBERRY PO Take 1 tablet by mouth every Monday, Wednesday, and Friday.   docusate sodium 100 MG capsule Commonly known as: COLACE Take 1 capsule (100 mg total) by mouth 2 (two) times daily. What changed: when to take this   Dulaglutide 1.5 MG/0.5ML Sopn Commonly known as: Trulicity Inject 1.5 mg into the skin once a week.  gabapentin 100 MG capsule Commonly known as: NEURONTIN TAKE 2 CAPSULES THREE TIMES A DAY   glucagon 1 MG injection Commonly known as: Glucagon Emergency Inject 1 mg into the vein once as needed. For hypoglycemic episodes. E11.42   insulin regular 100 units/mL injection Commonly known as: HumuLIN R Inject 0.03 mLs (3 Units total) into the skin 3 (three) times daily before meals.   levothyroxine 150 MCG tablet Commonly known as: SYNTHROID TAKE 1 TABLET DAILY   lidocaine 5 % Commonly known as: LIDODERM PLACE 1 PATCH ON THE SKIN DAILY   lisinopril 10 MG tablet Commonly known as: ZESTRIL TAKE 1 TABLET DAILY   omeprazole 40 MG capsule Commonly known as: PRILOSEC Take 1 capsule (40 mg total) by mouth 2 (two) times daily. What changed: Another medication with the same name was removed. Continue taking this medication, and follow the directions you see here. Changed by: Dorita Sciara, MD   pyridOXINE 100 MG tablet Commonly known as: VITAMIN B-6 Take 100 mg by mouth daily.   riboflavin 100 MG Tabs tablet Commonly known as: VITAMIN B-2 Take 100 mg by mouth daily.   simvastatin 40 MG tablet Commonly known as:  ZOCOR Take 1 tablet (40 mg total) by mouth at bedtime. What changed: when to take this   Turmeric 500 MG Tabs Take 750 mg by mouth 2 (two) times daily.   vitamin B-12 500 MCG tablet Commonly known as: CYANOCOBALAMIN Take 500 mcg by mouth daily.   Vitamin D 50 MCG (2000 UT) tablet Take by mouth.        DATA REVIEWED:  Lab Results  Component Value Date   HGBA1C 7.9 (H) 04/04/2018   HGBA1C 7.8 (H) 10/10/2017   HGBA1C 7.2 (H) 01/02/2017   Lab Results  Component Value Date   MICROALBUR 0.8 01/02/2017   LDLCALC 71 04/04/2018   CREATININE 1.12 04/04/2018   Lab Results  Component Value Date   CHOL 146 04/04/2018   HDL 56.20 04/04/2018   LDLCALC 71 04/04/2018   TRIG 93.0 04/04/2018   CHOLHDL 3 04/04/2018         ASSESSMENT / PLAN / RECOMMENDATIONS:   1) Type 2 Diabetes Mellitus, Sub-Optimally controlled, With CKD III and neuropathic complications - Most recent A1c of 7.9 %. Goal A1c < 7.5 %.    - Recent BG's have been out of control due to dietary indiscretions as she took a trip to the beach, we will not make any changes at this time, will continue to monitor for now and will adjust dose should hyperglycemia    MEDICATIONS:  Continue Trulicity  1.5 mg   Continue  Novolin- N 6 units QHS  Continue  Novolin- R  4 units TIDQAC   EDUCATION / INSTRUCTIONS:  BG monitoring instructions: Patient is instructed to check her blood sugars 4 times a day, before meals and bedtime.  Call [facility] if: BG persistently < 70 or > 300. . I reviewed the Rule of 15 for the treatment of hypoglycemia in detail with the patient. Literature supplied.    discussed the assessment and treatment plan with the patient. The patient was provided an opportunity to ask questions and all were answered. The patient agreed with the plan and demonstrated an understanding of the instructions.   The patient was advised to call back or seek an in-person evaluation if the symptoms  worsen or if the condition fails to improve as anticipated.   F/U in 6 weeks    Signed  electronically by: Mack Guise, MD  [facility] End[facility]in[facility]  [facility] [address] [place] [employer], [place] [address] Phone: [phone] FAX: [phone]   CC: Burnard Hawthorne, FNP [address] [place] [address] Phone: [phone]  Fax: [phone]  Return to Endocrinology clinic as below: No future appointments.

## 2018-07-07 NOTE — Telephone Encounter (Signed)
Medication Refill - Medication: acetaminophen-codeine (TYLENOL #3) 300-30 MG tablet  Preferred Pharmacy:  [id number] [address], [place] [phone] (Phone) [phone] (Fax)

## 2018-07-07 NOTE — Telephone Encounter (Signed)
Pt's daughter [name]) states that pt's hip/leg/back pain is worsening and due to their in ability to go to the gym and use the hot tub/pool pt's mobility is decreased.  [name] also reports that pt is now taking pain medication in the morning and at night, instead of once daily. [name] states that she attempted to make an appt with Dr Paulla Fore, but was informed he is no longer seeing pt's with [facility].  [name] would like to know if there is a PT facility that utilizes water therapies that her mother might benefit from.  Please call [name] to advise:  [phone]

## 2018-07-08 ENCOUNTER — Encounter: Payer: Medicare HMO | Admitting: Gastroenterology

## 2018-07-08 ENCOUNTER — Ambulatory Visit: Payer: Medicare HMO | Admitting: Nutrition

## 2018-07-08 ENCOUNTER — Telehealth: Payer: Self-pay | Admitting: Family

## 2018-07-08 DIAGNOSIS — M1612 Unilateral primary osteoarthritis, left hip: Secondary | ICD-10-CM

## 2018-07-08 NOTE — Telephone Encounter (Signed)
Has patient seen PCP for this complaint? Yes.   *If NO, is insurance requiring patient see PCP for this issue before PCP can refer them? Referral for which specialty: pt Preferred provider/office: water Physical therapy - [facility] [place] (phone).  Referral needs to be specific for water  Reason for referral: pt fell 3 last month and 1 time this month , daughter is worried that she is getting weaker  Please call daughter [name] [phone]

## 2018-07-09 ENCOUNTER — Encounter: Payer: Self-pay | Admitting: Family

## 2018-07-09 ENCOUNTER — Other Ambulatory Visit: Payer: Self-pay | Admitting: Family

## 2018-07-09 ENCOUNTER — Telehealth: Payer: Self-pay

## 2018-07-09 DIAGNOSIS — M4726 Other spondylosis with radiculopathy, lumbar region: Secondary | ICD-10-CM

## 2018-07-09 MED ORDER — ACETAMINOPHEN-CODEINE #3 300-30 MG PO TABS: 1.0000 | ORAL_TABLET | Freq: Two times a day (BID) | ORAL | 1 refills | Status: DC | PRN

## 2018-07-09 NOTE — Telephone Encounter (Signed)
Dr Derrel Nip,  FYI This patient has chronic low back pain. Currently on Tylenol #3.     Sarah,  Please Call daughter  I placed PT order to [facility].   I am also concerned with regard to falls. She is on a few sedating medications - including amitriptyline, gabapentin, tylenol #3. Does she feel we need to have doxy to perform med review and ensure falls are not from medications?   She hasn't had any imaging since 2018 , and that was only MR lumbar spine. No hip or leg imaging  Does she feel we need to repeat imaging? Consult orthopedics?  I have refilled tylenol #3 for BID PRN as long as daughter feels she is safe on this regimen. Please ensure she is NOT too sedated on this regimen. Please also ensure she takes with colace to prevent constipation.   I looked up patient on [place] Controlled Substances Reporting System and saw no activity that raised concern of inappropriate use.   Please make f/u in august since 3 months from prior OV.

## 2018-07-09 NOTE — Telephone Encounter (Signed)
Referral re-placed Let me know if any issues

## 2018-07-09 NOTE — Telephone Encounter (Signed)
I spoke with patient's daughter in regards to [employer]. See telephone note [phone]/20.

## 2018-07-09 NOTE — Progress Notes (Signed)
The patient's daughter was instructed on how to set up a [employer] account and how to link the account with our system.  She was then instructed on how to download the [facility] reader.  Her report was printed and put on Dr. Quin Hoop desk.

## 2018-07-09 NOTE — Telephone Encounter (Signed)
Patient's daughter was trained on how to set up a [employer] account and link her to our system.  She was also trained on how to download her reader.

## 2018-07-14 ENCOUNTER — Telehealth: Payer: Self-pay | Admitting: Nutrition

## 2018-07-14 NOTE — Telephone Encounter (Signed)
Message left on my phone that asked if we receiver her information from the [facility] and if Dr. Kelton Pillar wanted to make any changes.  ( download was given to [facility] to give to Dr. Kelton Pillar on Wednesday)

## 2018-07-14 NOTE — Telephone Encounter (Signed)
Daughter wanted me to inform you that pt will start PT on Wed. pool therapy also that her legs are weak and that she is falling more often.

## 2018-07-16 DIAGNOSIS — M25662 Stiffness of left knee, not elsewhere classified: Secondary | ICD-10-CM | POA: Diagnosis not present

## 2018-07-16 DIAGNOSIS — R262 Difficulty in walking, not elsewhere classified: Secondary | ICD-10-CM | POA: Diagnosis not present

## 2018-07-16 DIAGNOSIS — M5417 Radiculopathy, lumbosacral region: Secondary | ICD-10-CM | POA: Diagnosis not present

## 2018-07-16 DIAGNOSIS — M25651 Stiffness of right hip, not elsewhere classified: Secondary | ICD-10-CM | POA: Diagnosis not present

## 2018-07-16 DIAGNOSIS — M25652 Stiffness of left hip, not elsewhere classified: Secondary | ICD-10-CM | POA: Diagnosis not present

## 2018-07-16 DIAGNOSIS — M25661 Stiffness of right knee, not elsewhere classified: Secondary | ICD-10-CM | POA: Diagnosis not present

## 2018-07-21 DIAGNOSIS — M25662 Stiffness of left knee, not elsewhere classified: Secondary | ICD-10-CM | POA: Diagnosis not present

## 2018-07-21 DIAGNOSIS — R262 Difficulty in walking, not elsewhere classified: Secondary | ICD-10-CM | POA: Diagnosis not present

## 2018-07-21 DIAGNOSIS — M25651 Stiffness of right hip, not elsewhere classified: Secondary | ICD-10-CM | POA: Diagnosis not present

## 2018-07-21 DIAGNOSIS — M5417 Radiculopathy, lumbosacral region: Secondary | ICD-10-CM | POA: Diagnosis not present

## 2018-07-21 DIAGNOSIS — E1142 Type 2 diabetes mellitus with diabetic polyneuropathy: Secondary | ICD-10-CM | POA: Diagnosis not present

## 2018-07-21 DIAGNOSIS — M25661 Stiffness of right knee, not elsewhere classified: Secondary | ICD-10-CM | POA: Diagnosis not present

## 2018-07-21 DIAGNOSIS — M25652 Stiffness of left hip, not elsewhere classified: Secondary | ICD-10-CM | POA: Diagnosis not present

## 2018-07-21 NOTE — Telephone Encounter (Signed)
Please call [name] in regard to melissa's note below re: [facility] [place]. Referral had been placed 07/09/18.   Also, in prior note to [facility], would you please review the following,   Please Call daughter  I am also concerned with regard to falls. She is on a few sedating medications - including amitriptyline, gabapentin, tylenol #3. Does she feel we need to have doxy to perform med review and ensure falls are not from medications?   She hasn't had any imaging since 2018 , and that was only MR lumbar spine. No hip or leg imaging  Does she feel we need to repeat imaging? Consult orthopedics?  I have refilled tylenol #3 for BID PRN as long as daughter feels she is safe on this regimen. Please ensure she is NOT too sedated on this regimen. Please also ensure she takes with colace to prevent constipation.   Please make f/u in august since 3 months from prior OV.

## 2018-07-21 NOTE — Telephone Encounter (Signed)
Addressed in other phone note.

## 2018-07-21 NOTE — Telephone Encounter (Signed)
I know [facility] in [place] has aquatic PT available. She will need to give them a call since I'm not sure how it is operating right now d/t COVID. The number is [phone]

## 2018-07-21 NOTE — Telephone Encounter (Signed)
Thanks for speaking to [facility]  If you would complete call-   In regards to feeling dizzy, blood pressure medications; please offer f/u if she would like discuss. She may be orthostatic. We could also increase the lisinopril if needed.   BP Readings from Last 3 Encounters:  06/27/18 (!) 145/67  05/26/18 130/60  04/14/18 132/60

## 2018-07-23 DIAGNOSIS — M25651 Stiffness of right hip, not elsewhere classified: Secondary | ICD-10-CM | POA: Diagnosis not present

## 2018-07-23 DIAGNOSIS — R262 Difficulty in walking, not elsewhere classified: Secondary | ICD-10-CM | POA: Diagnosis not present

## 2018-07-23 DIAGNOSIS — M25652 Stiffness of left hip, not elsewhere classified: Secondary | ICD-10-CM | POA: Diagnosis not present

## 2018-07-23 DIAGNOSIS — M25662 Stiffness of left knee, not elsewhere classified: Secondary | ICD-10-CM | POA: Diagnosis not present

## 2018-07-23 DIAGNOSIS — M5417 Radiculopathy, lumbosacral region: Secondary | ICD-10-CM | POA: Diagnosis not present

## 2018-07-23 DIAGNOSIS — M25661 Stiffness of right knee, not elsewhere classified: Secondary | ICD-10-CM | POA: Diagnosis not present

## 2018-07-23 NOTE — Telephone Encounter (Signed)
Noted  

## 2018-08-04 DIAGNOSIS — M5417 Radiculopathy, lumbosacral region: Secondary | ICD-10-CM | POA: Diagnosis not present

## 2018-08-04 DIAGNOSIS — M25652 Stiffness of left hip, not elsewhere classified: Secondary | ICD-10-CM | POA: Diagnosis not present

## 2018-08-04 DIAGNOSIS — M25661 Stiffness of right knee, not elsewhere classified: Secondary | ICD-10-CM | POA: Diagnosis not present

## 2018-08-04 DIAGNOSIS — M25651 Stiffness of right hip, not elsewhere classified: Secondary | ICD-10-CM | POA: Diagnosis not present

## 2018-08-04 DIAGNOSIS — R262 Difficulty in walking, not elsewhere classified: Secondary | ICD-10-CM | POA: Diagnosis not present

## 2018-08-04 DIAGNOSIS — M25662 Stiffness of left knee, not elsewhere classified: Secondary | ICD-10-CM | POA: Diagnosis not present

## 2018-08-06 DIAGNOSIS — M25662 Stiffness of left knee, not elsewhere classified: Secondary | ICD-10-CM | POA: Diagnosis not present

## 2018-08-06 DIAGNOSIS — M25661 Stiffness of right knee, not elsewhere classified: Secondary | ICD-10-CM | POA: Diagnosis not present

## 2018-08-06 DIAGNOSIS — R262 Difficulty in walking, not elsewhere classified: Secondary | ICD-10-CM | POA: Diagnosis not present

## 2018-08-06 DIAGNOSIS — M25652 Stiffness of left hip, not elsewhere classified: Secondary | ICD-10-CM | POA: Diagnosis not present

## 2018-08-06 DIAGNOSIS — M5417 Radiculopathy, lumbosacral region: Secondary | ICD-10-CM | POA: Diagnosis not present

## 2018-08-06 DIAGNOSIS — M25651 Stiffness of right hip, not elsewhere classified: Secondary | ICD-10-CM | POA: Diagnosis not present

## 2018-08-12 ENCOUNTER — Other Ambulatory Visit: Payer: Self-pay | Admitting: Family

## 2018-08-12 NOTE — Telephone Encounter (Signed)
Medication: gabapentin (NEURONTIN) 100 MG capsule [[id number]]   Pharmacy:  [facility], [place] [phone] (Phone) [phone] (Fax)   Would like 22mo supply. Dr Paulla Fore left and unable to refill

## 2018-08-13 ENCOUNTER — Other Ambulatory Visit: Payer: Self-pay

## 2018-08-13 MED ORDER — GABAPENTIN 100 MG PO CAPS: ORAL_CAPSULE | ORAL | 1 refills | Status: DC

## 2018-08-14 NOTE — Telephone Encounter (Signed)
Rx was refilled on 08/13/18.

## 2018-08-18 DIAGNOSIS — M25661 Stiffness of right knee, not elsewhere classified: Secondary | ICD-10-CM | POA: Diagnosis not present

## 2018-08-18 DIAGNOSIS — M25652 Stiffness of left hip, not elsewhere classified: Secondary | ICD-10-CM | POA: Diagnosis not present

## 2018-08-18 DIAGNOSIS — M25662 Stiffness of left knee, not elsewhere classified: Secondary | ICD-10-CM | POA: Diagnosis not present

## 2018-08-18 DIAGNOSIS — M5417 Radiculopathy, lumbosacral region: Secondary | ICD-10-CM | POA: Diagnosis not present

## 2018-08-18 DIAGNOSIS — R262 Difficulty in walking, not elsewhere classified: Secondary | ICD-10-CM | POA: Diagnosis not present

## 2018-08-18 DIAGNOSIS — M25651 Stiffness of right hip, not elsewhere classified: Secondary | ICD-10-CM | POA: Diagnosis not present

## 2018-08-20 DIAGNOSIS — M5417 Radiculopathy, lumbosacral region: Secondary | ICD-10-CM | POA: Diagnosis not present

## 2018-08-20 DIAGNOSIS — M25661 Stiffness of right knee, not elsewhere classified: Secondary | ICD-10-CM | POA: Diagnosis not present

## 2018-08-20 DIAGNOSIS — R262 Difficulty in walking, not elsewhere classified: Secondary | ICD-10-CM | POA: Diagnosis not present

## 2018-08-20 DIAGNOSIS — M25652 Stiffness of left hip, not elsewhere classified: Secondary | ICD-10-CM | POA: Diagnosis not present

## 2018-08-20 DIAGNOSIS — M25651 Stiffness of right hip, not elsewhere classified: Secondary | ICD-10-CM | POA: Diagnosis not present

## 2018-08-20 DIAGNOSIS — M25662 Stiffness of left knee, not elsewhere classified: Secondary | ICD-10-CM | POA: Diagnosis not present

## 2018-08-21 ENCOUNTER — Other Ambulatory Visit (INDEPENDENT_AMBULATORY_CARE_PROVIDER_SITE_OTHER): Payer: Medicare HMO

## 2018-08-21 ENCOUNTER — Other Ambulatory Visit: Payer: Self-pay

## 2018-08-21 ENCOUNTER — Ambulatory Visit: Payer: Self-pay | Admitting: Family

## 2018-08-21 ENCOUNTER — Ambulatory Visit (INDEPENDENT_AMBULATORY_CARE_PROVIDER_SITE_OTHER): Payer: Medicare HMO | Admitting: Family Medicine

## 2018-08-21 ENCOUNTER — Other Ambulatory Visit: Payer: Self-pay | Admitting: Family Medicine

## 2018-08-21 ENCOUNTER — Encounter: Payer: Self-pay | Admitting: Family Medicine

## 2018-08-21 VITALS — Ht 63.0 in | Wt 150.0 lb

## 2018-08-21 DIAGNOSIS — R300 Dysuria: Secondary | ICD-10-CM | POA: Diagnosis not present

## 2018-08-21 DIAGNOSIS — N390 Urinary tract infection, site not specified: Secondary | ICD-10-CM | POA: Diagnosis not present

## 2018-08-21 DIAGNOSIS — R110 Nausea: Secondary | ICD-10-CM | POA: Diagnosis not present

## 2018-08-21 DIAGNOSIS — E1142 Type 2 diabetes mellitus with diabetic polyneuropathy: Secondary | ICD-10-CM | POA: Diagnosis not present

## 2018-08-21 MED ORDER — ONDANSETRON 4 MG PO TBDP: 4.0000 mg | ORAL_TABLET | Freq: Three times a day (TID) | ORAL | 0 refills | Status: DC | PRN

## 2018-08-21 MED ORDER — CEFDINIR 300 MG PO CAPS: 300.0000 mg | ORAL_CAPSULE | Freq: Two times a day (BID) | ORAL | 0 refills | Status: DC

## 2018-08-21 NOTE — Telephone Encounter (Signed)
See below

## 2018-08-21 NOTE — Telephone Encounter (Signed)
FYI Pt.'s daughter reports pt. Complained of burning with voiding last week and dark urine. Today she has dizziness and nausea. Reports vital signs are normal - no fever. Daughter feels like pt. May have a UTI. Warm transfer to [facility] in the practice for virtual visit.  Pt was seen today by NP Philis Nettle

## 2018-08-21 NOTE — Progress Notes (Signed)
Patient ID: Amy BeamRosetta Mae Calhoun, female   DOB: 05/18/1928, 85 y.o.   MRN: [id number]    Virtual Visit via video Note  This visit type was conducted due to national recommendations for restrictions regarding the COVID-19 pandemic (e.g. social distancing).  This format is felt to be most appropriate for this patient at this time.  All issues noted in this document were discussed and addressed.  No physical exam was performed (except for noted visual exam findings with Video Visits).   I connected with Tiyona Seth today at  2:00 PM EDT by a video enabled telemedicine application and verified that I am speaking with the correct person using two identifiers. Location patient: home Location provider: work or home office Persons participating in the virtual visit: patient, provider, daughter  I discussed the limitations, risks, security and privacy concerns of performing an evaluation and management service by telephone and the availability of in person appointments. I also discussed with the patient that there may be a patient responsible charge related to this service. The patient expressed understanding and agreed to proceed.  HPI:  Patient, daughter and I connected via video to discuss possible UTI symptoms.  Per daughter and patient she has had increased urinary frequency, complaints of burning with urination and some complaints of nausea.  Daughter states the symptoms been present for about a week, she began given her probiotic, cranberry and also a few doses of Azo.  States the probiotic cranberry and Azo seem to lessen symptoms somewhat however patient still having some burning with urination and some nausea.  Daughter states she tries to encourage her mother to drink as much water as possible, but this is often difficult.  No fever or chills.  No body aches.  No shortness of breath or wheezing.  No chest pain.  ROS: See pertinent positives and negatives per HPI.  Past Medical History:   Diagnosis Date  . Chronic kidney disease 08/07/2016   Chronic renal failure, stage III  . Depression   . Esophageal dysphagia   . Gastric ulcer   . GERD (gastroesophageal reflux disease)   . Hyperlipidemia   . Hypertension   . Hypothyroidism   . IDDM (insulin dependent diabetes mellitus) (HCC)   . Ischemic colitis (HCC)   . Osteoarthritis   . Ulcer of esophagus     Past Surgical History:  Procedure Laterality Date  . ABDOMINAL HYSTERECTOMY    . COLONOSCOPY  2010  . ESOPHAGEAL DILATION  08/07/2016   usually a couple times a year, last time 07/30/16  . GALLBLADDER SURGERY    . LUMBAR LAMINECTOMY/DECOMPRESSION MICRODISCECTOMY N/A 08/10/2016   Procedure: BILATERAL HEMILAMINECTOMY LUMBAR THREE-FOUR,LUMBAR FOUR-FIVE,LEFT LUMBAR FIVE-SACRAL ONE HEMILAMINECTOMY AND DECOMPRESSION;  Surgeon: Coletta Memosabbell, Kyle, MD;  Location: [facility] OR;  Service: Neurosurgery;  Laterality: N/A;  . THYROIDECTOMY      Family History  Problem Relation Age of Onset  . Diabetes Mother   . Arthritis Mother   . Hyperlipidemia Mother   . Mental illness Mother   . Heart disease Father   . Mental illness Sister   . Arthritis Maternal Grandmother   . Arthritis Maternal Grandfather   . Colon cancer Neg Hx   . Stomach cancer Neg Hx   . Esophageal cancer Neg Hx     Social History   Tobacco Use  . Smoking status: Never Smoker  . Smokeless tobacco: Never Used  Substance Use Topics  . Alcohol use: No    Current Outpatient Medications:  .  acetaminophen-codeine (  TYLENOL #3) 300-30 MG tablet, Take 1 tablet by mouth 2 (two) times daily as needed for severe pain., Disp: 60 tablet, Rfl: 1 .  acidophilus (RISAQUAD) CAPS capsule, Take 1 capsule by mouth daily., Disp: , Rfl:  .  albuterol (PROVENTIL HFA;VENTOLIN HFA) 108 (90 Base) MCG/ACT inhaler, Inhale 2 puffs into the lungs every 6 (six) hours as needed for wheezing or shortness of breath., Disp: 1 Inhaler, Rfl: 2 .  amitriptyline (ELAVIL) 75 MG tablet, TAKE 2 TABLETS  AT BEDTIME, Disp: 180 tablet, Rfl: 4 .  BD INSULIN SYRINGE U/F 31G X 5/16" 1 ML MISC, USE AS DIRECTED TO ADMINISTER INSULIN, Disp: 100 each, Rfl: 5 .  Cholecalciferol (VITAMIN D) 2000 units tablet, Take by mouth., Disp: , Rfl:  .  Coenzyme Q10 (CO Q 10) 100 MG CAPS, Take 1 capsule by mouth daily., Disp: , Rfl:  .  CRANBERRY PO, Take 1 tablet by mouth every Monday, Wednesday, and Friday., Disp: , Rfl:  .  docusate sodium (COLACE) 100 MG capsule, Take 1 capsule (100 mg total) by mouth 2 (two) times daily. (Patient taking differently: Take 100 mg by mouth every other day. ), Disp: 10 capsule, Rfl: 0 .  Dulaglutide (TRULICITY) 1.5 MG/0.5ML SOPN, Inject 1.5 mg into the skin once a week., Disp: 12 pen, Rfl: 3 .  gabapentin (NEURONTIN) 100 MG capsule, TAKE 2 CAPSULES THREE TIMES A DAY, Disp: 540 capsule, Rfl: 1 .  glucagon (GLUCAGON EMERGENCY) 1 MG injection, Inject 1 mg into the vein once as needed. For hypoglycemic episodes. E11.42, Disp: 1 each, Rfl: 3 .  insulin regular (HUMULIN R) 100 units/mL injection, Inject 0.03 mLs (3 Units total) into the skin 3 (three) times daily before meals., Disp: 10 mL, Rfl: 3 .  Insulin Syringe-Needle U-100 (BD INSULIN SYRINGE U/F) 31G X 5/16" 0.3 ML MISC, Use four times a day., Disp: 4 each, Rfl: 0 .  levothyroxine (SYNTHROID, LEVOTHROID) 150 MCG tablet, TAKE 1 TABLET DAILY, Disp: 90 tablet, Rfl: 4 .  lidocaine (LIDODERM) 5 %, PLACE 1 PATCH ON THE SKIN DAILY, Disp: 90 patch, Rfl: 1 .  lisinopril (PRINIVIL,ZESTRIL) 10 MG tablet, TAKE 1 TABLET DAILY, Disp: 90 tablet, Rfl: 3 .  omeprazole (PRILOSEC) 40 MG capsule, Take 1 capsule (40 mg total) by mouth 2 (two) times daily., Disp: 180 capsule, Rfl: 3 .  pyridOXINE (VITAMIN B-6) 100 MG tablet, Take 100 mg by mouth daily., Disp: , Rfl:  .  riboflavin (VITAMIN B-2) 100 MG TABS tablet, Take 100 mg by mouth daily., Disp: , Rfl:  .  simvastatin (ZOCOR) 40 MG tablet, Take 1 tablet (40 mg total) by mouth at bedtime. (Patient taking  differently: Take 40 mg by mouth 3 (three) times a week. ), Disp: 90 tablet, Rfl: 1 .  Turmeric 500 MG TABS, Take 750 mg by mouth 2 (two) times daily., Disp: , Rfl:  .  vitamin B-12 (CYANOCOBALAMIN) 500 MCG tablet, Take 500 mcg by mouth daily., Disp: , Rfl:   Current Facility-Administered Medications:  .  0.9 %  sodium chloride infusion, 500 mL, Intravenous, Once, Rachael FeeJacobs, Daniel P, MD  EXAM:  GENERAL: alert, oriented, appears well and in no acute distress  HEENT: atraumatic, conjunttiva clear, no obvious abnormalities on inspection of external nose and ears  NECK: normal movements of the head and neck  LUNGS: on inspection no signs of respiratory distress, breathing rate appears normal, no obvious gross SOB, gasping or wheezing  CV: no obvious cyanosis  MS: moves all visible extremities  without noticeable abnormality  PSYCH/NEURO: pleasant and cooperative, no obvious depression or anxiety, speech and thought processing grossly intact  ASSESSMENT AND PLAN:  Discussed the following assessment and plan:  UTI/dysuria/nausea - due to patient's symptoms sound consistent with a UTI we will begin with Omnicef twice daily for 5 days.  Patient and daughter will also come into clinic so she can bring urine sample and we will also check CBC and metabolic panel to look for any elevated white cells, any anemia, any decreased kidney function and any other signs of dehydration.  Discussed with both patient and daughter continuing to keep up good water intake, and avoiding excess sugary caffeinated beverages.   I discussed the assessment and treatment plan with the patient. The patient was provided an opportunity to ask questions and all were answered. The patient agreed with the plan and demonstrated an understanding of the instructions.   The patient was advised to call back or seek an in-person evaluation if the symptoms worsen or if the condition fails to improve as anticipated.  Jodelle Green,  FNP

## 2018-08-21 NOTE — Telephone Encounter (Signed)
Pt.'s daughter reports pt. Complained of burning with voiding last week and dark urine. Today she has dizziness and nausea. Reports vital signs are normal - no fever. Daughter feels like pt. May have a UTI. Warm transfer to [facility] in the practice for virtual visit.  Answer Assessment - Initial Assessment Questions 1. SYMPTOM: "What's the main symptom you're concerned about?" (e.g., frequency, incontinence)     Burning 2. ONSET: "When did the  burning  start?"     Last week 3. PAIN: "Is there any pain?" If so, ask: "How bad is it?" (Scale: 1-10; mild, moderate, severe)     Mild 4. CAUSE: "What do you think is causing the symptoms?"     Unsure 5. OTHER SYMPTOMS: "Do you have any other symptoms?" (e.g., fever, flank pain, blood in urine, pain with urination)     Dizziness, nausea 6. PREGNANCY: "Is there any chance you are pregnant?" "When was your last menstrual period?"     No  Protocols used: URINARY [facility]

## 2018-08-21 NOTE — Progress Notes (Signed)
Urine orders in

## 2018-08-21 NOTE — Telephone Encounter (Signed)
Coming in at 330 for labs and urine

## 2018-08-21 NOTE — Telephone Encounter (Signed)
Scheduled with Lauren @ 2

## 2018-08-22 ENCOUNTER — Telehealth: Payer: Self-pay | Admitting: *Deleted

## 2018-08-22 LAB — COMPREHENSIVE METABOLIC PANEL
ALT: 13 U/L (ref 0–35)
AST: 16 U/L (ref 0–37)
Albumin: 4.2 g/dL (ref 3.5–5.2)
Alkaline Phosphatase: 61 U/L (ref 39–117)
BUN: 18 mg/dL (ref 6–23)
CO2: 29 mEq/L (ref 19–32)
Calcium: 9.1 mg/dL (ref 8.4–10.5)
Chloride: 95 mEq/L — ABNORMAL LOW (ref 96–112)
Creatinine, Ser: 1.09 mg/dL (ref 0.40–1.20)
GFR: 47.38 mL/min — ABNORMAL LOW (ref >60.00)
Glucose, Bld: 238 mg/dL — ABNORMAL HIGH (ref 70–99)
Potassium: 4.4 mEq/L (ref 3.5–5.1)
Sodium: 134 mEq/L — ABNORMAL LOW (ref 135–145)
Total Bilirubin: 0.7 mg/dL (ref 0.2–1.2)
Total Protein: 6.6 g/dL (ref 6.0–8.3)

## 2018-08-22 LAB — CBC WITH DIFFERENTIAL/PLATELET
Basophils Absolute: 0.1 10*3/uL (ref 0.0–0.1)
Basophils Relative: 0.8 % (ref 0.0–3.0)
Eosinophils Absolute: 0.1 10*3/uL (ref 0.0–0.7)
Eosinophils Relative: 1 % (ref 0.0–5.0)
HCT: 38 % (ref 36.0–46.0)
Hemoglobin: 12.5 g/dL (ref 12.0–15.0)
Lymphocytes Relative: 35.1 % (ref 12.0–46.0)
Lymphs Abs: 2.5 10*3/uL (ref 0.7–4.0)
MCHC: 32.8 g/dL (ref 30.0–36.0)
MCV: 90 fl (ref 78.0–100.0)
Monocytes Absolute: 0.7 10*3/uL (ref 0.1–1.0)
Monocytes Relative: 10 % (ref 3.0–12.0)
Neutro Abs: 3.7 10*3/uL (ref 1.4–7.7)
Neutrophils Relative %: 53.1 % (ref 43.0–77.0)
Platelets: 243 10*3/uL (ref 150.0–400.0)
RBC: 4.22 Mil/uL (ref 3.87–5.11)
RDW: 13.4 % (ref 11.5–15.5)
WBC: 7 10*3/uL (ref 4.0–10.5)

## 2018-08-22 LAB — URINALYSIS, ROUTINE W REFLEX MICROSCOPIC
Bilirubin Urine: NEGATIVE
Ketones, ur: NEGATIVE
Nitrite: POSITIVE — AB
Specific Gravity, Urine: 1.01 (ref 1.000–1.030)
Total Protein, Urine: NEGATIVE
Urine Glucose: NEGATIVE
Urobilinogen, UA: 0.2 (ref 0.0–1.0)
pH: 7 (ref 5.0–8.0)

## 2018-08-22 NOTE — Telephone Encounter (Signed)
Copied from [place] [id number]. Topic: General - Call Back - No Documentation >> Aug 22, 2018  4:33 PM Erick Blinks wrote: Reason for CRM: Pt's daughter called requesting call back regarding lab results Best contact: [phone]

## 2018-08-23 LAB — URINE CULTURE
MICRO NUMBER:: 744259
SPECIMEN QUALITY:: ADEQUATE

## 2018-08-25 NOTE — Telephone Encounter (Signed)
Noted Agree with advice to call for follow up.

## 2018-09-03 ENCOUNTER — Other Ambulatory Visit: Payer: Self-pay

## 2018-09-03 ENCOUNTER — Telehealth: Payer: Self-pay | Admitting: Internal Medicine

## 2018-09-03 NOTE — Telephone Encounter (Signed)
Pt daughter called and stated that her mother's weight I now 140lbs when she weighed her today. Blood sugars have been as follows: 8/19 202 a.m. 299 lunch 8/18/ 232 bedtime 8/17 156 a.m. 8/16 135 a.m. 152 lunch 194 bedtime  Pt currently taking trulicity 1.5 mg weekly and humalog 3 units 3 times daily with meals. Pt also recently had a UTI and had taken [facility]

## 2018-09-03 NOTE — Telephone Encounter (Signed)
Pt daughter informed

## 2018-09-03 NOTE — Telephone Encounter (Signed)
Patients daughter called stating that the patient is having problems with her Dulaglutide (TRULICITY) 1.5 IH/4.7QQ SOPN. States that she is having a decreased apatite and is losing weight. Would like her to stop taking it.  Please Advise, Thanks

## 2018-09-03 NOTE — Telephone Encounter (Signed)
Patients daughter is returning Tashia's call.  Please Advise, Thanks

## 2018-09-04 ENCOUNTER — Other Ambulatory Visit: Payer: Self-pay

## 2018-09-04 ENCOUNTER — Ambulatory Visit (INDEPENDENT_AMBULATORY_CARE_PROVIDER_SITE_OTHER): Payer: Medicare HMO | Admitting: Family Medicine

## 2018-09-04 ENCOUNTER — Encounter: Payer: Self-pay | Admitting: Family Medicine

## 2018-09-04 VITALS — BP 142/60 | HR 84 | Temp 96.0°F | Resp 18 | Ht 64.0 in | Wt 140.8 lb

## 2018-09-04 DIAGNOSIS — E785 Hyperlipidemia, unspecified: Secondary | ICD-10-CM | POA: Diagnosis not present

## 2018-09-04 DIAGNOSIS — E1142 Type 2 diabetes mellitus with diabetic polyneuropathy: Secondary | ICD-10-CM

## 2018-09-04 DIAGNOSIS — E039 Hypothyroidism, unspecified: Secondary | ICD-10-CM | POA: Diagnosis not present

## 2018-09-04 DIAGNOSIS — M25651 Stiffness of right hip, not elsewhere classified: Secondary | ICD-10-CM | POA: Diagnosis not present

## 2018-09-04 DIAGNOSIS — R2689 Other abnormalities of gait and mobility: Secondary | ICD-10-CM

## 2018-09-04 DIAGNOSIS — I10 Essential (primary) hypertension: Secondary | ICD-10-CM

## 2018-09-04 DIAGNOSIS — M25661 Stiffness of right knee, not elsewhere classified: Secondary | ICD-10-CM | POA: Diagnosis not present

## 2018-09-04 DIAGNOSIS — N183 Chronic kidney disease, stage 3 unspecified: Secondary | ICD-10-CM

## 2018-09-04 DIAGNOSIS — M25662 Stiffness of left knee, not elsewhere classified: Secondary | ICD-10-CM | POA: Diagnosis not present

## 2018-09-04 DIAGNOSIS — M25652 Stiffness of left hip, not elsewhere classified: Secondary | ICD-10-CM | POA: Diagnosis not present

## 2018-09-04 DIAGNOSIS — R262 Difficulty in walking, not elsewhere classified: Secondary | ICD-10-CM | POA: Diagnosis not present

## 2018-09-04 DIAGNOSIS — R630 Anorexia: Secondary | ICD-10-CM

## 2018-09-04 DIAGNOSIS — M5417 Radiculopathy, lumbosacral region: Secondary | ICD-10-CM | POA: Diagnosis not present

## 2018-09-04 DIAGNOSIS — N390 Urinary tract infection, site not specified: Secondary | ICD-10-CM | POA: Diagnosis not present

## 2018-09-04 DIAGNOSIS — R5383 Other fatigue: Secondary | ICD-10-CM

## 2018-09-04 LAB — POCT URINALYSIS DIPSTICK
Bilirubin, UA: NEGATIVE
Blood, UA: NEGATIVE
Glucose, UA: NEGATIVE
Ketones, UA: NEGATIVE
Nitrite, UA: NEGATIVE
Protein, UA: NEGATIVE
Spec Grav, UA: 1.025 (ref 1.010–1.025)
Urobilinogen, UA: 0.2 E.U./dL
pH, UA: 5.5 (ref 5.0–8.0)

## 2018-09-04 LAB — B12 AND FOLATE PANEL
Folate: 6.9 ng/mL (ref >5.9)
Vitamin B-12: 1243 pg/mL — ABNORMAL HIGH (ref 211–911)

## 2018-09-04 LAB — TSH: TSH: 2.6 u[IU]/mL (ref 0.35–4.50)

## 2018-09-04 LAB — VITAMIN D 25 HYDROXY (VIT D DEFICIENCY, FRACTURES): VITD: 41.85 ng/mL (ref 30.00–100.00)

## 2018-09-04 NOTE — Progress Notes (Signed)
Subjective:    Patient ID: Amy Calhoun, female    DOB: 10/28/1928, 85 y.o.   MRN: [id number]  HPI   Patient and daughter present to clinic due to complaints of nausea, increased fatigue, balance issues.  Daughter states she has noticed this happening over the past 2 or 3 weeks and became concerned.  They recently stopped Trulicity, last dose was last week.  Daughter believes Trulicity is contributing to patient's nausea and decreased appetite.  Daughter also wonders if patient still has UTI or has another UTI due to patient not drinking as much water as she should and sometimes having foul-smelling urine.  No fever or chills.  No vomiting or diarrhea.  Patient states she tries to eat, but gets full quickly and tries to remember to drink but it is hard to drink lots of water throughout the day because she does not always feel super thirsty.  Daughter states they were also doing physical therapy for a while at the pool, but had to stop for about 3 months due to coronavirus pandemic.  Now they have resumed going to [facility] 2 times per week for in the pool sessions and this is been good for patient.  Daughter wondering if balance issues and fatigue could be related to vitamin deficiencies, thyroid issues. Also has decreased her lisinopril to 5 mg per day from 10 mg per day due to concerns over her balance and BP being too low at times. She also only takes her statin 3 days per week due to daughter noticing issues with memory more so when she took every day.   Also reviewed past lab work with patient and daughter, complete blood count is stable and kidney functions remain at stable level in accordance with her chronic kidney disease stage III diagnosis.   Patient Active Problem List   Diagnosis Date Noted  . Constipation 06/02/2018  . Acute pain of left shoulder 05/23/2018  . Diarrhea 05/23/2018  . Pneumatosis coli 07/29/2017  . 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  . Memory  disturbance 03/27/2017  . Recurrent urinary tract infection 10/17/2016  . Lumbar stenosis with neurogenic claudication 08/10/2016  . Anemia 07/06/2016  . Osteoarthritis of spine with radiculopathy, lumbar region 03/16/2016  . History of colitis 03/02/2016  . Primary osteoarthritis of left hip 03/02/2016  . CKD (chronic kidney disease) stage 3, GFR 30-59 ml/min (HCC) 02/21/2016  . DM type 2 with diabetic peripheral neuropathy (HCC) 01/19/2016  . Hyperlipidemia 01/19/2016  . Hypothyroidism 01/19/2016  . Essential hypertension 01/19/2016  . Chronic pain 01/19/2016  . Esophageal dysmotility 12/21/2015   Social History   Tobacco Use  . Smoking status: Never Smoker  . Smokeless tobacco: Never Used  Substance Use Topics  . Alcohol use: No   Review of Systems  Constitutional: Negative for chills, and fever. +fatigue HENT: Negative for congestion, ear pain, sinus pain and sore throat.   Eyes: Negative.   Respiratory: Negative for cough, shortness of breath and wheezing.   Cardiovascular: Negative for chest pain, palpitations and leg swelling.  Gastrointestinal: Negative for abdominal pain, diarrhea, nausea and vomiting.  Genitourinary: Negative for dysuria, frequency and urgency.  Musculoskeletal: Negative for arthralgias and myalgias.  Skin: Negative for color change, pallor and rash.  Neurological: Negative for syncope, light-headedness and headaches. +balance issues  Psychiatric/Behavioral: The patient is not nervous/anxious.       Objective:   Physical Exam Vitals signs and nursing note reviewed.  Constitutional:      General: She  is not in acute distress.    Appearance: She is not ill-appearing, toxic-appearing or diaphoretic.  HENT:     Head: Normocephalic and atraumatic.  Eyes:     General: No scleral icterus.    Extraocular Movements: Extraocular movements intact.     Conjunctiva/sclera: Conjunctivae normal.     Pupils: Pupils are equal, round, and reactive to light.   Cardiovascular:     Rate and Rhythm: Normal rate and regular rhythm.  Pulmonary:     Effort: Pulmonary effort is normal. No respiratory distress.     Breath sounds: Normal breath sounds.  Musculoskeletal:     Right lower leg: No edema.     Left lower leg: No edema.  Skin:    General: Skin is warm and dry.  Neurological:     Mental Status: She is alert and oriented to person, place, and time. Mental status is at baseline.     Gait: Gait normal.  Psychiatric:        Mood and Affect: Mood normal.        Behavior: Behavior normal.    Vitals:   09/04/18 0930  BP: (!) 142/60  Pulse: 84  Resp: 18  Temp: (!) 96 F (35.6 C)  SpO2: 90%      Assessment & Plan:   A total of 40  minutes were spent face-to-face with the patient during this encounter and over half of that time was spent on counseling and coordination of care. The patient was counseled on possible causes of balance problems, fatigue, nausea/decreased appetite and plan for work-up moving forward.   Fatigue-could be related to thyroid or vitamin deficiency.  We will further investigate with lab work.  Also discussed with daughter that this could possibly be age-related and a combination of multiple chronic diseases over many years.  Nausea/decreased appetite-daughter and patient are hopeful that her appetite will improve once Trulicity has gotten out of her system.  Encourage patient to drink plenty of water throughout the day, eat snacks and foods that she enjoys and if not feeling very hungry but needs to eat a meal to drink an Ensure or boost protein shake.  Type 2 diabetes-Trulicity suspected to be because of nausea.  She has stopped Trulicity.  She will continue insulin at current doses.  Follows regularly with endocrinology.  Hypothyroidism- we will get new thyroid panel to make sure levothyroxine dose is appropriate.  Essential hypertension- potential that her lisinopril dose to 10 mg could have been contributing to  balance issues, advised daughter that decreasing to 5 mg is appropriate and reasonable to continue moving forward.  Hyperlipidemia- she will continue statin 3 days a week, advised patient and daughter that at her age I think taking a statin a few days a week or even not at all would be something appropriate to consider.  Chronic kidney disease- levels remain stable when last lab work reviewed.  She has appointment with nephrology coming up in the next month.  Balance issue- urinalysis is unremarkable other than for 1+ leukocytes which I suspect to be related to possible external skin flora.  We will send urine culture to lab.  Advised daughter and patient that I do not believe she has a UTI. we will get MRI brain to look for any acute reason for balance problems.  Offered referral to vestibular physical therapy, daughter declines at this time and states we will just get MRI and go from there.  Encouraged changing positions slowly coming wearing good footwear  to help avoid any falls and getting bearings before walking.  Also discussed possibility of using a cane to have additional assistance with walking if needed for balance.  Lab work collected in clinic today.  Urine culture sent to lab.  She will be regular follow-ups with plan with PCP and specialist.  They will call office if any issues or questions/concerns arise.

## 2018-09-05 LAB — URINE CULTURE
MICRO NUMBER:: 793253
Result:: NO GROWTH
SPECIMEN QUALITY:: ADEQUATE

## 2018-09-10 DIAGNOSIS — M25662 Stiffness of left knee, not elsewhere classified: Secondary | ICD-10-CM | POA: Diagnosis not present

## 2018-09-10 DIAGNOSIS — M25661 Stiffness of right knee, not elsewhere classified: Secondary | ICD-10-CM | POA: Diagnosis not present

## 2018-09-10 DIAGNOSIS — M25652 Stiffness of left hip, not elsewhere classified: Secondary | ICD-10-CM | POA: Diagnosis not present

## 2018-09-10 DIAGNOSIS — R262 Difficulty in walking, not elsewhere classified: Secondary | ICD-10-CM | POA: Diagnosis not present

## 2018-09-10 DIAGNOSIS — M5417 Radiculopathy, lumbosacral region: Secondary | ICD-10-CM | POA: Diagnosis not present

## 2018-09-10 DIAGNOSIS — M25651 Stiffness of right hip, not elsewhere classified: Secondary | ICD-10-CM | POA: Diagnosis not present

## 2018-09-12 DIAGNOSIS — M25652 Stiffness of left hip, not elsewhere classified: Secondary | ICD-10-CM | POA: Diagnosis not present

## 2018-09-12 DIAGNOSIS — M5417 Radiculopathy, lumbosacral region: Secondary | ICD-10-CM | POA: Diagnosis not present

## 2018-09-12 DIAGNOSIS — M25662 Stiffness of left knee, not elsewhere classified: Secondary | ICD-10-CM | POA: Diagnosis not present

## 2018-09-12 DIAGNOSIS — R262 Difficulty in walking, not elsewhere classified: Secondary | ICD-10-CM | POA: Diagnosis not present

## 2018-09-12 DIAGNOSIS — M25651 Stiffness of right hip, not elsewhere classified: Secondary | ICD-10-CM | POA: Diagnosis not present

## 2018-09-12 DIAGNOSIS — M25661 Stiffness of right knee, not elsewhere classified: Secondary | ICD-10-CM | POA: Diagnosis not present

## 2018-09-17 DIAGNOSIS — M25662 Stiffness of left knee, not elsewhere classified: Secondary | ICD-10-CM | POA: Diagnosis not present

## 2018-09-17 DIAGNOSIS — M25651 Stiffness of right hip, not elsewhere classified: Secondary | ICD-10-CM | POA: Diagnosis not present

## 2018-09-17 DIAGNOSIS — M25652 Stiffness of left hip, not elsewhere classified: Secondary | ICD-10-CM | POA: Diagnosis not present

## 2018-09-17 DIAGNOSIS — R262 Difficulty in walking, not elsewhere classified: Secondary | ICD-10-CM | POA: Diagnosis not present

## 2018-09-17 DIAGNOSIS — M5417 Radiculopathy, lumbosacral region: Secondary | ICD-10-CM | POA: Diagnosis not present

## 2018-09-17 DIAGNOSIS — M25661 Stiffness of right knee, not elsewhere classified: Secondary | ICD-10-CM | POA: Diagnosis not present

## 2018-09-24 DIAGNOSIS — M5417 Radiculopathy, lumbosacral region: Secondary | ICD-10-CM | POA: Diagnosis not present

## 2018-09-24 DIAGNOSIS — M25652 Stiffness of left hip, not elsewhere classified: Secondary | ICD-10-CM | POA: Diagnosis not present

## 2018-09-24 DIAGNOSIS — M25662 Stiffness of left knee, not elsewhere classified: Secondary | ICD-10-CM | POA: Diagnosis not present

## 2018-09-24 DIAGNOSIS — M25661 Stiffness of right knee, not elsewhere classified: Secondary | ICD-10-CM | POA: Diagnosis not present

## 2018-09-24 DIAGNOSIS — R262 Difficulty in walking, not elsewhere classified: Secondary | ICD-10-CM | POA: Diagnosis not present

## 2018-09-24 DIAGNOSIS — M25651 Stiffness of right hip, not elsewhere classified: Secondary | ICD-10-CM | POA: Diagnosis not present

## 2018-09-29 ENCOUNTER — Other Ambulatory Visit: Payer: Self-pay | Admitting: Family

## 2018-09-29 DIAGNOSIS — E785 Hyperlipidemia, unspecified: Secondary | ICD-10-CM

## 2018-10-01 ENCOUNTER — Other Ambulatory Visit: Payer: Self-pay

## 2018-10-01 DIAGNOSIS — R262 Difficulty in walking, not elsewhere classified: Secondary | ICD-10-CM | POA: Diagnosis not present

## 2018-10-01 DIAGNOSIS — M25662 Stiffness of left knee, not elsewhere classified: Secondary | ICD-10-CM | POA: Diagnosis not present

## 2018-10-01 DIAGNOSIS — M1612 Unilateral primary osteoarthritis, left hip: Secondary | ICD-10-CM

## 2018-10-01 DIAGNOSIS — M25661 Stiffness of right knee, not elsewhere classified: Secondary | ICD-10-CM | POA: Diagnosis not present

## 2018-10-01 DIAGNOSIS — M25651 Stiffness of right hip, not elsewhere classified: Secondary | ICD-10-CM | POA: Diagnosis not present

## 2018-10-01 DIAGNOSIS — M5417 Radiculopathy, lumbosacral region: Secondary | ICD-10-CM | POA: Diagnosis not present

## 2018-10-01 DIAGNOSIS — M25652 Stiffness of left hip, not elsewhere classified: Secondary | ICD-10-CM | POA: Diagnosis not present

## 2018-10-01 MED ORDER — ACETAMINOPHEN-CODEINE #3 300-30 MG PO TABS: 1.0000 | ORAL_TABLET | Freq: Two times a day (BID) | ORAL | 1 refills | Status: DC | PRN

## 2018-10-01 NOTE — Telephone Encounter (Signed)
I looked up patient on [place] Controlled Substances Reporting System and saw no activity that raised concern of inappropriate use.   Call patient and daughter  I refilled tylenol with codeine; please ensure no falls , excessive sedation  [facility] however please ensure that this medication is coming to a dedicated mail box and no concerns that someone else may could pick up. I dont typically send controlled substances through the mail due to concerns for safety.

## 2018-10-03 ENCOUNTER — Other Ambulatory Visit: Payer: Self-pay

## 2018-10-07 ENCOUNTER — Encounter: Payer: Self-pay | Admitting: Internal Medicine

## 2018-10-07 ENCOUNTER — Other Ambulatory Visit: Payer: Self-pay

## 2018-10-07 ENCOUNTER — Ambulatory Visit (INDEPENDENT_AMBULATORY_CARE_PROVIDER_SITE_OTHER): Payer: Medicare HMO | Admitting: Internal Medicine

## 2018-10-07 VITALS — BP 132/64 | HR 83 | Temp 97.5°F | Ht 64.0 in | Wt 139.8 lb

## 2018-10-07 DIAGNOSIS — E1142 Type 2 diabetes mellitus with diabetic polyneuropathy: Secondary | ICD-10-CM

## 2018-10-07 DIAGNOSIS — N183 Chronic kidney disease, stage 3 (moderate): Secondary | ICD-10-CM | POA: Diagnosis not present

## 2018-10-07 DIAGNOSIS — I129 Hypertensive chronic kidney disease with stage 1 through stage 4 chronic kidney disease, or unspecified chronic kidney disease: Secondary | ICD-10-CM | POA: Diagnosis not present

## 2018-10-07 DIAGNOSIS — E1122 Type 2 diabetes mellitus with diabetic chronic kidney disease: Secondary | ICD-10-CM | POA: Diagnosis not present

## 2018-10-07 LAB — POCT GLYCOSYLATED HEMOGLOBIN (HGB A1C): Hemoglobin A1C: 9 % — AB (ref 4.0–5.6)

## 2018-10-07 MED ORDER — BD PEN NEEDLE NANO U/F 32G X 4 MM MISC: 1.0000 | Freq: Every day | 3 refills | Status: DC

## 2018-10-07 MED ORDER — LANTUS SOLOSTAR 100 UNIT/ML ~~LOC~~ SOPN: 10.0000 [IU] | PEN_INJECTOR | Freq: Every day | SUBCUTANEOUS | 11 refills | Status: DC

## 2018-10-07 MED ORDER — INSULIN REGULAR HUMAN 100 UNIT/ML IJ SOLN: 6.0000 [IU] | Freq: Three times a day (TID) | INTRAMUSCULAR | 3 refills | Status: DC

## 2018-10-07 NOTE — Progress Notes (Signed)
Name: Amy Calhoun  Age/ Sex: 85 y.o., female   MRN/ DOB: [id number], 1928/09/18     PCP: Allegra Grana, FNP   Reason for Endocrinology Evaluation: Type 2 Diabetes Mellitus  Initial Endocrine Consultative Visit: 04/15/2018    PATIENT IDENTIFIER: [name] is a 85 y.o. female with a past medical history of T2DM, Dyslipidemia, Hypothyroidism and HTN . The patient has followed with Endocrinology clinic since 04/15/2018 for consultative assistance with management of her diabetes.   Patient used to be cared for by her other daughter in [place] but daughter had passed due to a motorcycle accident and Amy Calhoun brought mother to [facility] in 2017.   DIABETIC HISTORY:  [name] was diagnosed with DM at age 26. She has been on insulin since her diagnosis.She was briefly on glipizide but this was stopped in March, 2020. Pt did not recall any other oral glycemic agents. Her hemoglobin A1c has ranged from 6.5 % in 2018, peaking at 8.9%  In 2018.  Daughter would like to explore putting mother on glycemic agents, she is concerned about having MDI regimen as mother gets older and worried about future dexterity.   On her initial visit to our clinic, she was on Humulin-N BID, Humulin-R per SS and Tradjenta.  We stopped Tradjenta, started Trulicity in March, 2020, with reduction in insulin doses.   SUBJECTIVE:   During the last visit (07/07/2018): This was a virtual visit, we continued trulicity and MDI regimen.    Today (10/07/2018): [name] is here with daughter Amy Calhoun for a  3 month follow up on diabetes. She is accompanied by her daughter Amy Calhoun today. She lives at [facility] .Since her last visit here , we stopped Trulicity due to weight loss.  She checks her blood sugars 5-4 times daily, preprandial and bedtime. The patient has had hypoglycemic episodes since the last clinic visit. The patient is not symptomatic with these episodes. Otherwise, the patient has not required any recent emergency  interventions for hypoglycemia and has not had recent hospitalizations secondary to hyper or hypoglycemic episodes.    ROS: As per HPI and as detailed below: Review of Systems  Constitutional: Negative for chills and fever.  HENT: Negative for congestion and sore throat.   Respiratory: Negative for cough and shortness of breath.   Cardiovascular: Negative for chest pain and palpitations.  Gastrointestinal: Negative for diarrhea and nausea.      HOME DIABETES REGIMEN:   Humulin-N 6 units at bedtime only - She has been taking 20 units   Humulin-R 4 units TID QAC   CONTINUOUS GLUCOSE MONITORING RECORD INTERPRETATION    Dates of Recording: 9/9-9/22/20  Sensor description:Freestyle Libre  Results statistics:   CGM use % of time 67  Average and SD 254/40  Time in range   25   %  % Time Above 180 21  % Time above 250 53  % Time Below target 1    Glycemic patterns summary: Hyperglycemia starting in the morning, lasts all day until the night time   Hypoglycemic episodes occurred none   Overnight periods: Hyperglycemia with steep drop to within goal.     HISTORY:  Past Medical History:  Past Medical History:  Diagnosis Date  . Chronic kidney disease 08/07/2016   Chronic renal failure, stage III  . Depression   . Esophageal dysphagia   . Gastric ulcer   . GERD (gastroesophageal reflux disease)   . Hyperlipidemia   . Hypertension   .  Hypothyroidism   . IDDM (insulin dependent diabetes mellitus) (HCC)   . Ischemic colitis (HCC)   . Osteoarthritis   . Ulcer of esophagus    Past Surgical History:  Past Surgical History:  Procedure Laterality Date  . ABDOMINAL HYSTERECTOMY    . COLONOSCOPY  2010  . ESOPHAGEAL DILATION  08/07/2016   usually a couple times a year, last time 07/30/16  . GALLBLADDER SURGERY    . LUMBAR LAMINECTOMY/DECOMPRESSION MICRODISCECTOMY N/A 08/10/2016   Procedure: BILATERAL HEMILAMINECTOMY LUMBAR THREE-FOUR,LUMBAR FOUR-FIVE,LEFT LUMBAR  FIVE-SACRAL ONE HEMILAMINECTOMY AND DECOMPRESSION;  Surgeon: Coletta Memosabbell, Kyle, MD;  Location: [facility] OR;  Service: Neurosurgery;  Laterality: N/A;  . THYROIDECTOMY      Social History:  reports that she has never smoked. She has never used smokeless tobacco. She reports that she does not drink alcohol or use drugs. Family History:  Family History  Problem Relation Age of Onset  . Diabetes Mother   . Arthritis Mother   . Hyperlipidemia Mother   . Mental illness Mother   . Heart disease Father   . Mental illness Sister   . Arthritis Maternal Grandmother   . Arthritis Maternal Grandfather   . Colon cancer Neg Hx   . Stomach cancer Neg Hx   . Esophageal cancer Neg Hx      HOME MEDICATIONS: Allergies as of 10/07/2018      Reactions   Pravachol [pravastatin] Other (See Comments)   States she refused due to side effects      Medication List       Accurate as of October 07, 2018  2:25 PM. If you have any questions, ask your nurse or doctor.        STOP taking these medications   Dulaglutide 1.5 MG/0.5ML Sopn Commonly known as: Trulicity Stopped by: Scarlette ShortsIbtehal J Shamleffer, MD     TAKE these medications   acetaminophen-codeine 300-30 MG tablet Commonly known as: TYLENOL #3 Take 1 tablet by mouth 2 (two) times daily as needed for severe pain.   acidophilus Caps capsule Take 1 capsule by mouth daily.   albuterol 108 (90 Base) MCG/ACT inhaler Commonly known as: VENTOLIN HFA Inhale 2 puffs into the lungs every 6 (six) hours as needed for wheezing or shortness of breath.   amitriptyline 75 MG tablet Commonly known as: ELAVIL TAKE 2 TABLETS AT BEDTIME   BD Insulin Syringe U/F 31G X 5/16" 1 ML Misc Generic drug: Insulin Syringe-Needle U-100 USE AS DIRECTED TO ADMINISTER INSULIN   Insulin Syringe-Needle U-100 31G X 5/16" 0.3 ML Misc Commonly known as: BD Insulin Syringe U/F Use four times a day.   BD Pen Needle Nano U/F 32G X 4 MM Misc Generic drug: Insulin Pen Needle 1  Device by Does not apply route daily. Started by: Scarlette ShortsIbtehal J Shamleffer, MD   cefdinir 300 MG capsule Commonly known as: OMNICEF Take 1 capsule (300 mg total) by mouth 2 (two) times daily.   Co Q 10 100 MG Caps Take 1 capsule by mouth daily.   CRANBERRY PO Take 1 tablet by mouth every Monday, Wednesday, and Friday.   docusate sodium 100 MG capsule Commonly known as: COLACE Take 1 capsule (100 mg total) by mouth 2 (two) times daily. What changed: when to take this   gabapentin 100 MG capsule Commonly known as: NEURONTIN TAKE 2 CAPSULES THREE TIMES A DAY   glucagon 1 MG injection Commonly known as: Glucagon Emergency Inject 1 mg into the vein once as needed. For hypoglycemic episodes. E11.42  insulin regular 100 units/mL injection Commonly known as: HumuLIN R Inject 0.06 mLs (6 Units total) into the skin 3 (three) times daily before meals. What changed: how much to take Changed by: Dorita Sciara, MD   Lantus SoloStar 100 UNIT/ML Solostar Pen Generic drug: Insulin Glargine Inject 10 Units into the skin at bedtime. Started by: Dorita Sciara, MD   levothyroxine 150 MCG tablet Commonly known as: SYNTHROID TAKE 1 TABLET DAILY   lidocaine 5 % Commonly known as: LIDODERM PLACE 1 PATCH ON THE SKIN DAILY   lisinopril 10 MG tablet Commonly known as: ZESTRIL TAKE 1 TABLET DAILY   omeprazole 40 MG capsule Commonly known as: PRILOSEC Take 1 capsule (40 mg total) by mouth 2 (two) times daily.   ondansetron 4 MG disintegrating tablet Commonly known as: Zofran ODT Take 1 tablet (4 mg total) by mouth every 8 (eight) hours as needed for nausea or vomiting.   pyridOXINE 100 MG tablet Commonly known as: VITAMIN B-6 Take 100 mg by mouth daily.   riboflavin 100 MG Tabs tablet Commonly known as: VITAMIN B-2 Take 100 mg by mouth daily.   simvastatin 40 MG tablet Commonly known as: ZOCOR TAKE 1 TABLET AT BEDTIME   Turmeric 500 MG Tabs Take 750 mg by mouth 2  (two) times daily.   vitamin B-12 500 MCG tablet Commonly known as: CYANOCOBALAMIN Take 500 mcg by mouth daily.   Vitamin D 50 MCG (2000 UT) tablet Take by mouth.        OBJECTIVE:   Vital Signs: BP 132/64 (BP Location: Left Arm, Patient Position: Sitting, Cuff Size: Large)   Pulse 83   Temp (!) 97.5 F (36.4 C)   Ht 5\' 4"  (1.626 m)   Wt 139 lb 12.8 oz (63.4 kg)   SpO2 97%   BMI 24.00 kg/m   Wt Readings from Last 3 Encounters:  10/07/18 139 lb 12.8 oz (63.4 kg)  09/04/18 140 lb 12.8 oz (63.9 kg)  08/21/18 150 lb (68 kg)     Exam: General: Pt appears well and is in NAD  Lungs: Clear with good BS bilat with no rales, rhonchi, or wheezes  Heart: RRR with normal S1 and S2 and no gallops; no murmurs; no rub  Abdomen: Normoactive bowel sounds, soft, nontender, without masses or organomegaly palpable  Extremities: No pretibial edema.  Skin: Normal texture and temperature to palpation. No rash noted. No Acanthosis nigricans/skin tags.  Neuro: MS is good with appropriate affect, pt is alert and Ox3   DM FOOT EXAM 10/07/2018 The skin of the feet is intact without sores or ulcerations. The pedal pulses are decreased. The sensation is decreased to a screening 5.07, 10 gram monofilament bilaterally    DATA REVIEWED:  Lab Results  Component Value Date   HGBA1C 9.0 (A) 10/07/2018   HGBA1C 7.9 (H) 04/04/2018   HGBA1C 7.8 (H) 10/10/2017   Lab Results  Component Value Date   MICROALBUR 0.8 01/02/2017   LDLCALC 71 04/04/2018   CREATININE 1.09 08/21/2018   Lab Results  Component Value Date   MICRALBCREAT 1.5 01/02/2017     Lab Results  Component Value Date   CHOL 146 04/04/2018   HDL 56.20 04/04/2018   LDLCALC 71 04/04/2018   TRIG 93.0 04/04/2018   CHOLHDL 3 04/04/2018         ASSESSMENT / PLAN / RECOMMENDATIONS:   1) Insulin-Dependent, Sub-Optimally controlled, With CKD III and neuropathic complications - Most recent A1c of 9.0 %. Goal A1c <  7.5 %.    -  Pt with worsening glycemic control  - We stopped trulicity due to loss of appetite and weight loss.  - She has increased NPH from 6 to 20 units which I have discouraged her against, her BG's have been dropping > 100 points overnight which puts her at risk for hypoglycemia.  - Daughter's attempt was to see if mother could do without insulin, to simplify her regimen  but unfortunately due to low GFR , we are limited in oral glycemic agents, we tried Trulicity but she still required insulin (at lesser doses) ,pt didn't like the weight loss and loss of appetite so we stopped it.  - Daughter is asking if we could have add on therapy , she has glipizide at home but I explained to her that its not recommended to combine insulin with SU due to risk of hypoglycemia.  - We discussed the risk of hypoglycemia in her age group.  - We discussed the benefit of insulin analogues vs NPH, the safety and predictability profile. Pt agreed to try lantus . Daughter asked for vials, but we explained its safer and easier for the pt to use a pen rather then a vial.    MEDICATIONS:  STOP Novolin-N   Lantus  Units QHS   Novolin- R  units TIDQAC   EDUCATION / INSTRUCTIONS:  BG monitoring instructions: Patient is instructed to check her blood sugars 4 times a day, before meals and bedtime.  Call [facility] if: BG persistently < 70 or > 300. . I reviewed the Rule of 15 for the treatment of hypoglycemia in detail with the patient. Literature supplied.     F/U in 3 months    Signed electronically by: Lyndle Herrlich, MD  [facility]  [facility] [address] [place] [place], [place] [address] Phone: [phone] FAX: [phone]   CC: Allegra Grana, FNP [address] [place] [address] Phone: [phone]  Fax: [phone]  Return to Endocrinology clinic as below: Future Appointments  Date Time Provider Department Center   12/30/2018  1:40 PM Shamleffer, Konrad Dolores, MD [facility] None

## 2018-10-07 NOTE — Patient Instructions (Addendum)
-   STOP Novolin-N (NPH) - Lantus  Units QHS - Increase regular insulin to 6 units with each meal   - Check sugar before each meal and bedtime    HOW TO TREAT LOW BLOOD SUGARS (Blood sugar LESS THAN 70 MG/DL)  Please follow the RULE OF 15 for the treatment of hypoglycemia treatment (when your (blood sugars are less than 70 mg/dL)    STEP 1: Take 15 grams of carbohydrates when your blood sugar is low, which includes:   3-4 GLUCOSE TABS  OR  3-4 OZ OF JUICE OR REGULAR SODA OR  ONE TUBE OF GLUCOSE GEL     STEP 2: RECHECK blood sugar in 15 MINUTES STEP 3: If your blood sugar is still low at the 15 minute recheck --> then, go back to STEP 1 and treat AGAIN with another 15 grams of carbohydrates.

## 2018-10-09 ENCOUNTER — Other Ambulatory Visit: Payer: Self-pay

## 2018-10-09 ENCOUNTER — Telehealth: Payer: Self-pay | Admitting: Family

## 2018-10-09 MED ORDER — FREESTYLE LIBRE 14 DAY SENSOR MISC: 1.0000 | 0 refills | Status: DC

## 2018-10-09 NOTE — Telephone Encounter (Signed)
Spoke to [place] and stated that I have not received anything from [place] for her mother. She requested that I send a refill for one 14 day sensor to [facility], told her that I would and that I will also reach out to [employer] [facility] to see what they need from [place] to complete request for remaining sensors and have them shipped to pt.

## 2018-10-09 NOTE — Telephone Encounter (Signed)
I spoke with patient's daughter [name] & apologized for the delay. I explained that Joycelyn Schmid is actually on maternity leave so there has been some delays in paperwork. [name] was understanding & was glad that patient saw endocrinology. She stated that they would be taking over prescription for libre sensors & all diabetes medications. I let [name] know that any physician would be willing to see her mom if needed in Margaret's absence.

## 2018-10-09 NOTE — Telephone Encounter (Signed)
Patient's daughter, [name], stated that [facility] needs office visit notes on the patient to be able to refill her LeBria for taking her blood sugar readings. The supply company said they have sent for this for several weeks now.

## 2018-10-09 NOTE — Telephone Encounter (Signed)
Patients daughter has also reached out to [place] in regards to the message below. She is wanting [place] to take over her mothers Libre's supply order. [name] states that that [facility] also sent [place] a request.  Also offered an alternative pharmacy ([id number] [address], [place]) since patients runs out of supplies Monday.  Please Advise,  Thanks

## 2018-10-09 NOTE — Telephone Encounter (Signed)
[phone] [facility] supply#  daughter has called earlier this am. She was to call back with this phone number for diabetic supplies.  This is the #  Per previous message  Pt daughter / upset, says that mom really needs and Oletta Lamas has sent several request

## 2018-10-09 NOTE — Telephone Encounter (Signed)
Signed paperwork faxed to [facility]

## 2018-10-09 NOTE — Telephone Encounter (Signed)
Received fax from [facility] and placed paperwork on Dr. Quin Hoop desk for signature.

## 2018-10-10 ENCOUNTER — Telehealth: Payer: Self-pay | Admitting: Lab

## 2018-10-10 NOTE — Telephone Encounter (Signed)
Called and Spoke to Pt daughter, I was calling her about PT and MRI referral. Pt daughter stated her mother already had PT done when [facility] referred her there, she doesn't need it again. Pt daughter also stated No to the MRI because her insurance denied her. Pt daughter stated her mother is doing okay, But her diabetes equipment needs a refill her Concha [place] at [facility] and to ask Judson Roch she knows all about her mothers supply and equipment issues.

## 2018-10-10 NOTE — Telephone Encounter (Signed)
I spoke with Amy Calhoun yesterday & everything was taken care of by endocrinology. They faxed over her notes to [facility] that was needed. All should be taken care of.

## 2018-10-13 DIAGNOSIS — E1142 Type 2 diabetes mellitus with diabetic polyneuropathy: Secondary | ICD-10-CM | POA: Diagnosis not present

## 2018-10-30 ENCOUNTER — Telehealth: Payer: Self-pay

## 2018-10-30 ENCOUNTER — Other Ambulatory Visit: Payer: Self-pay

## 2018-10-30 DIAGNOSIS — E1142 Type 2 diabetes mellitus with diabetic polyneuropathy: Secondary | ICD-10-CM

## 2018-10-30 MED ORDER — INSULIN REGULAR HUMAN 100 UNIT/ML IJ SOLN: 11.0000 [IU] | Freq: Three times a day (TID) | INTRAMUSCULAR | 3 refills | Status: DC

## 2018-10-30 NOTE — Telephone Encounter (Signed)
Recent readings as follows: 10/15 251 a.m 10/14 198 a.m. 428 bed 10/13 276 a.m. 356 dinner Pt currently on 10 units Lantus qhs and 6 units humulin 3 times daily Emma wanted to stop by this afternoon and have [place] downloaded so that you will have her recent numbers. Please advise.

## 2018-10-30 NOTE — Telephone Encounter (Signed)
Pt daughter [name] informed of the increase and stated that she understood directions.

## 2018-11-06 DIAGNOSIS — E113393 Type 2 diabetes mellitus with moderate nonproliferative diabetic retinopathy without macular edema, bilateral: Secondary | ICD-10-CM | POA: Diagnosis not present

## 2018-11-06 DIAGNOSIS — H524 Presbyopia: Secondary | ICD-10-CM | POA: Diagnosis not present

## 2018-11-12 DIAGNOSIS — E1142 Type 2 diabetes mellitus with diabetic polyneuropathy: Secondary | ICD-10-CM | POA: Diagnosis not present

## 2018-11-25 ENCOUNTER — Other Ambulatory Visit: Payer: Self-pay | Admitting: Family

## 2018-11-25 DIAGNOSIS — E1142 Type 2 diabetes mellitus with diabetic polyneuropathy: Secondary | ICD-10-CM

## 2018-11-27 ENCOUNTER — Other Ambulatory Visit: Payer: Self-pay | Admitting: Family

## 2018-11-27 DIAGNOSIS — E1142 Type 2 diabetes mellitus with diabetic polyneuropathy: Secondary | ICD-10-CM

## 2018-12-15 DIAGNOSIS — E1142 Type 2 diabetes mellitus with diabetic polyneuropathy: Secondary | ICD-10-CM | POA: Diagnosis not present

## 2018-12-30 ENCOUNTER — Ambulatory Visit: Payer: Medicare HMO | Admitting: Internal Medicine

## 2018-12-30 ENCOUNTER — Other Ambulatory Visit: Payer: Self-pay | Admitting: Family

## 2018-12-31 ENCOUNTER — Other Ambulatory Visit: Payer: Self-pay

## 2018-12-31 ENCOUNTER — Encounter: Payer: Self-pay | Admitting: Family

## 2019-01-01 ENCOUNTER — Encounter: Payer: Self-pay | Admitting: Internal Medicine

## 2019-01-01 ENCOUNTER — Ambulatory Visit (INDEPENDENT_AMBULATORY_CARE_PROVIDER_SITE_OTHER): Payer: Medicare HMO | Admitting: Internal Medicine

## 2019-01-01 ENCOUNTER — Other Ambulatory Visit: Payer: Self-pay | Admitting: Family

## 2019-01-01 VITALS — BP 118/58 | HR 77 | Temp 98.1°F | Ht 64.0 in | Wt 144.8 lb

## 2019-01-01 DIAGNOSIS — M1612 Unilateral primary osteoarthritis, left hip: Secondary | ICD-10-CM

## 2019-01-01 DIAGNOSIS — E1142 Type 2 diabetes mellitus with diabetic polyneuropathy: Secondary | ICD-10-CM | POA: Diagnosis not present

## 2019-01-01 LAB — POCT GLYCOSYLATED HEMOGLOBIN (HGB A1C): Hemoglobin A1C: 7.4 % — AB (ref 4.0–5.6)

## 2019-01-01 MED ORDER — LANTUS SOLOSTAR 100 UNIT/ML ~~LOC~~ SOPN: 8.0000 [IU] | PEN_INJECTOR | Freq: Every day | SUBCUTANEOUS | 11 refills | Status: DC

## 2019-01-01 MED ORDER — INSULIN REGULAR HUMAN 100 UNIT/ML IJ SOLN: 6.0000 [IU] | Freq: Three times a day (TID) | INTRAMUSCULAR | 3 refills | Status: DC

## 2019-01-01 NOTE — Progress Notes (Signed)
Name: Amy Calhoun  Age/ Sex: 85 y.o., female   MRN/ DOB: [id number], [birth year]     PCP: Amy Grana, FNP   Reason for Endocrinology Evaluation: Type 2 Diabetes Mellitus  Initial Endocrine Consultative Visit: 04/15/2018    PATIENT IDENTIFIER: Amy Calhoun is a 85 y.o. female with a past medical history of T2DM, Dyslipidemia, Hypothyroidism and HTN . The patient has followed with Endocrinology clinic since 04/15/2018 for consultative assistance with management of her diabetes.   Patient used to be cared for by her other daughter in [place] but daughter had passed due to a motorcycle accident and Amy Calhoun brought mother to [facility] in 2017.   DIABETIC HISTORY:  Amy Calhoun was diagnosed with DM at age 45. She has been on insulin since her diagnosis.She was briefly on glipizide but this was stopped in March, 2020. Pt did not recall any other oral glycemic agents. Her hemoglobin A1c has ranged from 6.5 % in 2018, peaking at 8.9%  In 2018.  Daughter would like to explore putting mother on glycemic agents, she is concerned about having MDI regimen as mother gets older and worried about future dexterity.   On her initial visit to our clinic, she was on Humulin-N BID, Humulin-R per SS and Tradjenta.  We stopped Tradjenta, started Trulicity in March, 2020, with reduction in insulin doses.   In 09/2018 Replaced Humulin-N with Lantus   SUBJECTIVE:   During the last visit (10/07/2018): We stopped Humulin-N and started on Lantus    Today (01/01/2019): Amy Calhoun is here with daughter Amy Calhoun for a  3 month follow up on diabetes. She is accompanied by her daughter Amy Calhoun today. She lives at [facility] .  She checks her blood sugars multiple times a day through the freestyle libre. The patient has had hypoglycemic episodes since the last clinic visit but these have occurred less then in the past . The patient eats 3 meals a day but her supper consists of dessert that's left over from lunch. Daughter  states mother takes prandial insulin with breakfast and lunch but doesn't take the supper dose due to fear of overnight hypoglycemia. Pt states that if her pre-prandial BG's are < 200 mg/dL She doesn't take prandial insulin.     ROS: As per HPI and as detailed below: Review of Systems  Constitutional: Negative for chills and fever.  HENT: Negative for congestion and sore throat.   Respiratory: Negative for cough and shortness of breath.   Cardiovascular: Negative for chest pain and palpitations.  Gastrointestinal: Negative for diarrhea and nausea.      HOME DIABETES REGIMEN:   Lantus 10 units daily   Humulin-R 10 units TID QAC   CONTINUOUS GLUCOSE MONITORING RECORD INTERPRETATION    Dates of Recording: 12/4-12/17/20  Sensor description:Freestyle Libre  Results statistics:   CGM use % of time 73  Average and SD 186/37.8  Time in range   53  %  % Time Above 180 27  % Time above 250 19  % Time Below target 1    Glycemic patterns summary: Hyperglycemia at supper time   Hypoglycemic episodes occurred once , early morning   Overnight periods: trends down     HISTORY:  Past Medical History:  Past Medical History:  Diagnosis Date  . Chronic kidney disease 08/07/2016   Chronic renal failure, stage III  . Depression   . Esophageal dysphagia   . Gastric ulcer   . GERD (gastroesophageal reflux disease)   .  Hyperlipidemia   . Hypertension   . Hypothyroidism   . IDDM (insulin dependent diabetes mellitus)   . Ischemic colitis (HCC)   . Osteoarthritis   . Ulcer of esophagus    Past Surgical History:  Past Surgical History:  Procedure Laterality Date  . ABDOMINAL HYSTERECTOMY    . COLONOSCOPY  2010  . ESOPHAGEAL DILATION  08/07/2016   usually a couple times a year, last time 07/30/16  . GALLBLADDER SURGERY    . LUMBAR LAMINECTOMY/DECOMPRESSION MICRODISCECTOMY N/A 08/10/2016   Procedure: BILATERAL HEMILAMINECTOMY LUMBAR THREE-FOUR,LUMBAR FOUR-FIVE,LEFT LUMBAR  FIVE-SACRAL ONE HEMILAMINECTOMY AND DECOMPRESSION;  Surgeon: Coletta Memos, MD;  Location: [facility] OR;  Service: Neurosurgery;  Laterality: N/A;  . THYROIDECTOMY      Social History:  reports that she has never smoked. She has never used smokeless tobacco. She reports that she does not drink alcohol or use drugs. Family History:  Family History  Problem Relation Age of Onset  . Diabetes Mother   . Arthritis Mother   . Hyperlipidemia Mother   . Mental illness Mother   . Heart disease Father   . Mental illness Sister   . Arthritis Maternal Grandmother   . Arthritis Maternal Grandfather   . Colon cancer Neg Hx   . Stomach cancer Neg Hx   . Esophageal cancer Neg Hx      HOME MEDICATIONS: Allergies as of 01/01/2019      Reactions   Pravachol [pravastatin] Other (See Comments)   States she refused due to side effects      Medication List       Accurate as of January 01, 2019 12:24 PM. If you have any questions, ask your nurse or doctor.        acetaminophen-codeine 300-30 MG tablet Commonly known as: TYLENOL #3 Take 1 tablet by mouth 2 (two) times daily as needed for severe pain.   acidophilus Caps capsule Take 1 capsule by mouth daily.   albuterol 108 (90 Base) MCG/ACT inhaler Commonly known as: VENTOLIN HFA Inhale 2 puffs into the lungs every 6 (six) hours as needed for wheezing or shortness of breath.   amitriptyline 75 MG tablet Commonly known as: ELAVIL TAKE 2 TABLETS AT BEDTIME   BD Insulin Syringe U/F 31G X 5/16" 1 ML Misc Generic drug: Insulin Syringe-Needle U-100 USE AS DIRECTED TO ADMINISTER INSULIN   Insulin Syringe-Needle U-100 31G X 5/16" 0.3 ML Misc Commonly known as: BD Insulin Syringe U/F Use four times a day.   BD Pen Needle Nano U/F 32G X 4 MM Misc Generic drug: Insulin Pen Needle 1 Device by Does not apply route daily.   cefdinir 300 MG capsule Commonly known as: OMNICEF Take 1 capsule (300 mg total) by mouth 2 (two) times daily.   Co Q 10  100 MG Caps Take 1 capsule by mouth daily.   CRANBERRY PO Take 1 tablet by mouth every Monday, Wednesday, and Friday.   docusate sodium 100 MG capsule Commonly known as: COLACE Take 1 capsule (100 mg total) by mouth 2 (two) times daily. What changed: when to take this   FreeStyle Libre 14 Day Sensor Misc 1 Package by Does not apply route every 14 (fourteen) days.   gabapentin 100 MG capsule Commonly known as: NEURONTIN TAKE 2 CAPSULES THREE TIMES A DAY   glucagon 1 MG injection Inject 1 mg into the vein once as needed. For hypoglycemic episodes. E11.42   insulin regular 100 units/mL injection Commonly known as: HumuLIN R Inject 0.06 mLs (  6 Units total) into the skin 3 (three) times daily before meals. What changed:   medication strength  See the new instructions. Changed by: Dorita Sciara, MD   Lantus SoloStar 100 UNIT/ML Solostar Pen Generic drug: Insulin Glargine Inject 8 Units into the skin at bedtime. What changed: how much to take Changed by: Dorita Sciara, MD   levothyroxine 150 MCG tablet Commonly known as: SYNTHROID TAKE 1 TABLET DAILY   lidocaine 5 % Commonly known as: LIDODERM PLACE 1 PATCH ON THE SKIN DAILY   lisinopril 10 MG tablet Commonly known as: ZESTRIL TAKE 1 TABLET DAILY What changed: how much to take   omeprazole 40 MG capsule Commonly known as: PRILOSEC Take 1 capsule (40 mg total) by mouth 2 (two) times daily.   ondansetron 4 MG disintegrating tablet Commonly known as: Zofran ODT Take 1 tablet (4 mg total) by mouth every 8 (eight) hours as needed for nausea or vomiting.   pyridOXINE 100 MG tablet Commonly known as: VITAMIN B-6 Take 100 mg by mouth daily.   riboflavin 100 MG Tabs tablet Commonly known as: VITAMIN B-2 Take 100 mg by mouth daily.   simvastatin 40 MG tablet Commonly known as: ZOCOR TAKE 1 TABLET AT BEDTIME   Turmeric 500 MG Tabs Take 750 mg by mouth 2 (two) times daily.   vitamin B-12 500 MCG  tablet Commonly known as: CYANOCOBALAMIN Take 500 mcg by mouth daily.   Vitamin D 50 MCG (2000 UT) tablet Take by mouth.        OBJECTIVE:   Vital Signs: BP (!) 118/58 (BP Location: Left Arm, Patient Position: Sitting, Cuff Size: Normal)   Pulse 77   Temp 98.1 F (36.7 C)   Ht 5\' 4"  (1.626 m)   Wt 144 lb 12.8 oz (65.7 kg)   SpO2 97%   BMI 24.85 kg/m   Wt Readings from Last 3 Encounters:  01/01/19 144 lb 12.8 oz (65.7 kg)  10/07/18 139 lb 12.8 oz (63.4 kg)  09/04/18 140 lb 12.8 oz (63.9 kg)     Exam: General: Pt appears well and is in NAD  Lungs: Clear with good BS bilat with no rales, rhonchi, or wheezes  Heart: RRR with normal S1 and S2 and no gallops; no murmurs; no rub  Abdomen: Normoactive bowel sounds, soft, nontender, without masses or organomegaly palpable  Extremities: No pretibial edema.  Neuro: MS is good with appropriate affect, pt is alert and Ox3   DM FOOT EXAM 10/07/2018 The skin of the feet is intact without sores or ulcerations. The pedal pulses are decreased. The sensation is decreased to a screening 5.07, 10 gram monofilament bilaterally    DATA REVIEWED:  Lab Results  Component Value Date   HGBA1C 7.4 (A) 01/01/2019   HGBA1C 9.0 (A) 10/07/2018   HGBA1C 7.9 (H) 04/04/2018   Lab Results  Component Value Date   MICROALBUR 0.8 01/02/2017   LDLCALC 71 04/04/2018   CREATININE 1.09 08/21/2018   Lab Results  Component Value Date   MICRALBCREAT 31.7 (H) 12/23/2017     Lab Results  Component Value Date   CHOL 146 04/04/2018   HDL 56.20 04/04/2018   LDLCALC 71 04/04/2018   TRIG 93.0 04/04/2018   CHOLHDL 3 04/04/2018         ASSESSMENT / PLAN / RECOMMENDATIONS:   1) Insulin-Dependent, Optimally controlled, With CKD III and neuropathic complications - Most recent A1c of 7.4 %. Goal A1c < 7.5 %.    - In  review of CGM download, her BG's have been optimal during the day but with hyperglycemia > 250 mg/dL at supper time .  - She was  encouraged to take prandial with supper, per daughter pt has not been taking it with supper due to fear of hypoglycemia.  - We discussed the risk of hypoglycemia in her age group.  - We also discussed that with her advanced age and co morbidities, our goal is to avoid hypoglycemia and symptomatic hyperglycemia and if we can keep her BG's at < 250 mg/dL would be a good goal.   MEDICATIONS:   Decrease Lantus to 8 Units QHS  Novolin- R 6  units TIDQAC   EDUCATION / INSTRUCTIONS:  BG monitoring instructions: Patient is instructed to check her blood sugars 4 times a day, before meals and bedtime.  Call [facility] if: BG persistently < 70 or > 300. . I reviewed the Rule of 15 for the treatment of hypoglycemia in detail with the patient. Literature supplied.     F/U in 4 months    Signed electronically by: Lyndle HerrlichAbby Jaralla Glenn Christo, MD  [facility]  [facility] [address] [place], [place] [address] Phone: [phone] FAX: [phone]   CC: Amy Granarnett, Margaret G, FNP [address] [place] [address] Phone: [phone]  Fax: [phone]  Return to Endocrinology clinic as below: No future appointments.

## 2019-01-01 NOTE — Telephone Encounter (Signed)
Amy Calhoun,  Call daughter Refilled medication: tylenol #3  See also mychart message re: amitriptyline.( see message) Please make f/u   FYI [name]  Patient using tylenol appropriately prn for chronic LBP. She has CKD so NSAIDs are not appropriate.   I looked up patient on [place] Controlled Substances Reporting System and saw no activity that raised concern of inappropriate use.

## 2019-01-01 NOTE — Patient Instructions (Addendum)
-   Lantus 8 Units bedtime  - Regular insulin 6 units with each meal      HOW TO TREAT LOW BLOOD SUGARS (Blood sugar LESS THAN 70 MG/DL)  Please follow the RULE OF 15 for the treatment of hypoglycemia treatment (when your (blood sugars are less than 70 mg/dL)    STEP 1: Take 15 grams of carbohydrates when your blood sugar is low, which includes:   3-4 GLUCOSE TABS  OR  3-4 OZ OF JUICE OR REGULAR SODA OR  ONE TUBE OF GLUCOSE GEL     STEP 2: RECHECK blood sugar in 15 MINUTES STEP 3: If your blood sugar is still low at the 15 minute recheck --> then, go back to STEP 1 and treat AGAIN with another 15 grams of carbohydrates.

## 2019-01-02 ENCOUNTER — Other Ambulatory Visit: Payer: Self-pay

## 2019-01-02 MED ORDER — AMITRIPTYLINE HCL 75 MG PO TABS: 150.0000 mg | ORAL_TABLET | Freq: Every day | ORAL | 4 refills | Status: DC

## 2019-01-05 ENCOUNTER — Encounter: Payer: Self-pay | Admitting: Family

## 2019-01-05 DIAGNOSIS — G4700 Insomnia, unspecified: Secondary | ICD-10-CM | POA: Insufficient documentation

## 2019-01-07 ENCOUNTER — Other Ambulatory Visit: Payer: Self-pay | Admitting: Family

## 2019-01-07 DIAGNOSIS — M1612 Unilateral primary osteoarthritis, left hip: Secondary | ICD-10-CM

## 2019-01-07 MED ORDER — ACETAMINOPHEN-CODEINE #3 300-30 MG PO TABS: ORAL_TABLET | ORAL | 0 refills | Status: DC

## 2019-01-07 NOTE — Telephone Encounter (Signed)
Spoke with pt's daughter and she stated that the pt only has two tylenol #3 left. The rx was sent in to the mail order and they called her and told her that she would not be getting it in the mail until 01/13/2019. Daughter is wanting to know if pt can get enough to get her through until she gets her rx in the mail.

## 2019-01-07 NOTE — Telephone Encounter (Signed)
Patient's daughter states that [facility] wont have rx ready until 01/13/2019. Please give her a call back @ [phone]

## 2019-01-07 NOTE — Telephone Encounter (Signed)
Called and left voicemail that I have prescribed 20 tabs of Tylenol 3 at a local [facility].

## 2019-01-14 DIAGNOSIS — E1142 Type 2 diabetes mellitus with diabetic polyneuropathy: Secondary | ICD-10-CM | POA: Diagnosis not present

## 2019-01-22 ENCOUNTER — Ambulatory Visit: Payer: Medicare HMO | Attending: Internal Medicine

## 2019-01-22 DIAGNOSIS — Z20822 Contact with and (suspected) exposure to covid-19: Secondary | ICD-10-CM | POA: Diagnosis not present

## 2019-01-23 ENCOUNTER — Telehealth: Payer: Self-pay

## 2019-01-23 NOTE — Telephone Encounter (Signed)
Daughter advise result not back yet

## 2019-01-24 LAB — NOVEL CORONAVIRUS, NAA: SARS-CoV-2, NAA: DETECTED — AB

## 2019-01-25 ENCOUNTER — Telehealth: Payer: Self-pay | Admitting: Nurse Practitioner

## 2019-01-25 NOTE — Telephone Encounter (Signed)
Called to discuss with patient about Covid symptoms and the use of bamlanivimab, a monoclonal antibody infusion for those with mild to moderate Covid symptoms and at a high risk of hospitalization.  Pt is qualified for this infusion at the [facility] infusion center, but she has no interest in receiving it.  She has had symptoms 7 days.  Discussed infusion with her daughter and her, but they are not interested.  Recommended follow-up with PCP and if any SOB or difficulty breathing to immediately go to ER.

## 2019-01-27 ENCOUNTER — Telehealth: Payer: Self-pay | Admitting: Family

## 2019-01-27 NOTE — Telephone Encounter (Signed)
I called and advised patient on below. She said that she had been having her mom drink electrolyte water as well as the glucerna shakes. Just trying to keep her energy up & her hydrated. She said that overall her mom had done well & mostly just not had a slight cough. He is released from [employer]. I asked that she keep [place] updated & she has follow-up 1/29.

## 2019-01-27 NOTE — Telephone Encounter (Signed)
Pt daughter Amy Calhoun called pt has Covid she's in day 9 but she's fine just weakness in legs. Daughter has been giving her eggs and protein drinks. She had a low grade fever. Pt daughter wants to know what else can be given to gain back her strength? She's not dehydrated. Please advise and Thank you!  Call daughter @ [phone].

## 2019-01-27 NOTE — Telephone Encounter (Signed)
LM for [name] to call back.

## 2019-01-27 NOTE — Telephone Encounter (Signed)
Call pt  I would advise slow increase of activity - walking 5 mins per day and increase every day.  Please sch follow up with me

## 2019-01-27 NOTE — Telephone Encounter (Signed)
Pt's daughter [name] returned phone call.

## 2019-01-28 ENCOUNTER — Other Ambulatory Visit: Payer: Self-pay

## 2019-01-28 ENCOUNTER — Telehealth: Payer: Self-pay | Admitting: Family

## 2019-01-28 ENCOUNTER — Encounter: Payer: Self-pay | Admitting: Emergency Medicine

## 2019-01-28 ENCOUNTER — Emergency Department: Payer: Medicare HMO

## 2019-01-28 ENCOUNTER — Observation Stay
Admission: EM | Admit: 2019-01-28 | Discharge: 2019-01-29 | Disposition: A | Discharge status: Home / Self Care | Payer: Medicare HMO | Attending: Internal Medicine | Admitting: Internal Medicine

## 2019-01-28 DIAGNOSIS — Z794 Long term (current) use of insulin: Secondary | ICD-10-CM | POA: Insufficient documentation

## 2019-01-28 DIAGNOSIS — Z8711 Personal history of peptic ulcer disease: Secondary | ICD-10-CM | POA: Diagnosis not present

## 2019-01-28 DIAGNOSIS — E1122 Type 2 diabetes mellitus with diabetic chronic kidney disease: Secondary | ICD-10-CM | POA: Insufficient documentation

## 2019-01-28 DIAGNOSIS — E1129 Type 2 diabetes mellitus with other diabetic kidney complication: Secondary | ICD-10-CM | POA: Diagnosis not present

## 2019-01-28 DIAGNOSIS — J9601 Acute respiratory failure with hypoxia: Secondary | ICD-10-CM

## 2019-01-28 DIAGNOSIS — R0602 Shortness of breath: Secondary | ICD-10-CM | POA: Diagnosis not present

## 2019-01-28 DIAGNOSIS — E785 Hyperlipidemia, unspecified: Secondary | ICD-10-CM | POA: Insufficient documentation

## 2019-01-28 DIAGNOSIS — E1165 Type 2 diabetes mellitus with hyperglycemia: Secondary | ICD-10-CM | POA: Diagnosis not present

## 2019-01-28 DIAGNOSIS — Z79890 Hormone replacement therapy (postmenopausal): Secondary | ICD-10-CM | POA: Insufficient documentation

## 2019-01-28 DIAGNOSIS — I129 Hypertensive chronic kidney disease with stage 1 through stage 4 chronic kidney disease, or unspecified chronic kidney disease: Secondary | ICD-10-CM | POA: Insufficient documentation

## 2019-01-28 DIAGNOSIS — R531 Weakness: Secondary | ICD-10-CM | POA: Diagnosis not present

## 2019-01-28 DIAGNOSIS — U071 COVID-19: Principal | ICD-10-CM | POA: Diagnosis present

## 2019-01-28 DIAGNOSIS — Z791 Long term (current) use of non-steroidal anti-inflammatories (NSAID): Secondary | ICD-10-CM | POA: Insufficient documentation

## 2019-01-28 DIAGNOSIS — E1142 Type 2 diabetes mellitus with diabetic polyneuropathy: Secondary | ICD-10-CM | POA: Insufficient documentation

## 2019-01-28 DIAGNOSIS — IMO0002 Reserved for concepts with insufficient information to code with codable children: Secondary | ICD-10-CM

## 2019-01-28 DIAGNOSIS — E039 Hypothyroidism, unspecified: Secondary | ICD-10-CM | POA: Diagnosis not present

## 2019-01-28 DIAGNOSIS — N183 Chronic kidney disease, stage 3 unspecified: Secondary | ICD-10-CM | POA: Diagnosis not present

## 2019-01-28 DIAGNOSIS — I10 Essential (primary) hypertension: Secondary | ICD-10-CM | POA: Diagnosis not present

## 2019-01-28 DIAGNOSIS — K219 Gastro-esophageal reflux disease without esophagitis: Secondary | ICD-10-CM | POA: Diagnosis not present

## 2019-01-28 DIAGNOSIS — F329 Major depressive disorder, single episode, unspecified: Secondary | ICD-10-CM | POA: Insufficient documentation

## 2019-01-28 LAB — BASIC METABOLIC PANEL
Anion gap: 12 (ref 5–15)
BUN: 22 mg/dL (ref 8–23)
CO2: 25 mmol/L (ref 22–32)
Calcium: 7.9 mg/dL — ABNORMAL LOW (ref 8.9–10.3)
Chloride: 98 mmol/L (ref 98–111)
Creatinine, Ser: 0.98 mg/dL (ref 0.44–1.00)
GFR calc Af Amer: 60 mL/min — ABNORMAL LOW (ref >60)
GFR calc non Af Amer: 52 mL/min — ABNORMAL LOW (ref >60)
Glucose, Bld: 273 mg/dL — ABNORMAL HIGH (ref 70–99)
Potassium: 3.9 mmol/L (ref 3.5–5.1)
Sodium: 135 mmol/L (ref 135–145)

## 2019-01-28 LAB — URINALYSIS, COMPLETE (UACMP) WITH MICROSCOPIC
Bilirubin Urine: NEGATIVE
Glucose, UA: >=500 mg/dL — AB
Hgb urine dipstick: NEGATIVE
Ketones, ur: 5 mg/dL — AB
Nitrite: NEGATIVE
Protein, ur: 30 mg/dL — AB
Specific Gravity, Urine: 1.016 (ref 1.005–1.030)
WBC, UA: >50 WBC/hpf — ABNORMAL HIGH (ref 0–5)
pH: 6 (ref 5.0–8.0)

## 2019-01-28 LAB — CBC
HCT: 35.3 % — ABNORMAL LOW (ref 36.0–46.0)
Hemoglobin: 11.3 g/dL — ABNORMAL LOW (ref 12.0–15.0)
MCH: 28.7 pg (ref 26.0–34.0)
MCHC: 32 g/dL (ref 30.0–36.0)
MCV: 89.6 fL (ref 80.0–100.0)
Platelets: 199 10*3/uL (ref 150–400)
RBC: 3.94 MIL/uL (ref 3.87–5.11)
RDW: 12.8 % (ref 11.5–15.5)
WBC: 5.1 10*3/uL (ref 4.0–10.5)
nRBC: 0 % (ref 0.0–0.2)

## 2019-01-28 LAB — ABO/RH: ABO/RH(D): B NEG

## 2019-01-28 LAB — PROCALCITONIN: Procalcitonin: <0.1 ng/mL

## 2019-01-28 MED ORDER — ALBUTEROL SULFATE HFA 108 (90 BASE) MCG/ACT IN AERS
2.0000 | INHALATION_SPRAY | Freq: Four times a day (QID) | RESPIRATORY_TRACT | Status: DC | PRN
  Filled 2019-01-28: qty 6.7

## 2019-01-28 MED ORDER — ONDANSETRON HCL 4 MG/2ML IJ SOLN: 4.0000 mg | Freq: Four times a day (QID) | INTRAMUSCULAR | Status: DC | PRN

## 2019-01-28 MED ORDER — CYANOCOBALAMIN 500 MCG PO TABS
500.0000 ug | ORAL_TABLET | Freq: Every day | ORAL | Status: DC
  Administered 2019-01-29: 09:00:00 500 ug via ORAL
  Filled 2019-01-28 (×2): qty 1

## 2019-01-28 MED ORDER — RISAQUAD PO CAPS
1.0000 | ORAL_CAPSULE | Freq: Every day | ORAL | Status: DC
  Administered 2019-01-28 – 2019-01-29 (×2): 1 via ORAL
  Filled 2019-01-28 (×3): qty 1

## 2019-01-28 MED ORDER — GUAIFENESIN-DM 100-10 MG/5ML PO SYRP
10.0000 mL | ORAL_SOLUTION | ORAL | Status: DC | PRN
  Filled 2019-01-28: qty 10

## 2019-01-28 MED ORDER — AMITRIPTYLINE HCL 25 MG PO TABS
150.0000 mg | ORAL_TABLET | Freq: Every day | ORAL | Status: DC
  Administered 2019-01-28: 150 mg via ORAL
  Filled 2019-01-28: qty 3

## 2019-01-28 MED ORDER — SODIUM CHLORIDE 0.9 % IV BOLUS
500.0000 mL | Freq: Once | INTRAVENOUS | Status: AC
  Administered 2019-01-28: 500 mL via INTRAVENOUS

## 2019-01-28 MED ORDER — IPRATROPIUM-ALBUTEROL 20-100 MCG/ACT IN AERS
1.0000 | INHALATION_SPRAY | Freq: Four times a day (QID) | RESPIRATORY_TRACT | Status: DC
  Administered 2019-01-28 – 2019-01-29 (×4): 1 via RESPIRATORY_TRACT
  Filled 2019-01-28: qty 4

## 2019-01-28 MED ORDER — DEXAMETHASONE SODIUM PHOSPHATE 10 MG/ML IJ SOLN: 6.0000 mg | Freq: Once | INTRAMUSCULAR | Status: DC

## 2019-01-28 MED ORDER — PANTOPRAZOLE SODIUM 40 MG PO TBEC
40.0000 mg | DELAYED_RELEASE_TABLET | Freq: Every day | ORAL | Status: DC
  Administered 2019-01-28 – 2019-01-29 (×2): 40 mg via ORAL
  Filled 2019-01-28 (×2): qty 1

## 2019-01-28 MED ORDER — DOCUSATE SODIUM 100 MG PO CAPS: 100.0000 mg | ORAL_CAPSULE | ORAL | Status: DC

## 2019-01-28 MED ORDER — ACETAMINOPHEN 325 MG PO TABS: 650.0000 mg | ORAL_TABLET | Freq: Four times a day (QID) | ORAL | Status: DC | PRN

## 2019-01-28 MED ORDER — INSULIN ASPART 100 UNIT/ML ~~LOC~~ SOLN
6.0000 [IU] | Freq: Three times a day (TID) | SUBCUTANEOUS | Status: DC
  Administered 2019-01-29 (×2): 6 [IU] via SUBCUTANEOUS
  Filled 2019-01-28 (×2): qty 1

## 2019-01-28 MED ORDER — PREDNISONE 20 MG PO TABS
40.0000 mg | ORAL_TABLET | Freq: Every day | ORAL | Status: DC
  Administered 2019-01-28 – 2019-01-29 (×2): 40 mg via ORAL
  Filled 2019-01-28: qty 2

## 2019-01-28 MED ORDER — ASCORBIC ACID 500 MG PO TABS
500.0000 mg | ORAL_TABLET | Freq: Every day | ORAL | Status: DC
  Administered 2019-01-28 – 2019-01-29 (×2): 500 mg via ORAL
  Filled 2019-01-28 (×3): qty 1

## 2019-01-28 MED ORDER — PREDNISONE 20 MG PO TABS: 40.0000 mg | ORAL_TABLET | Freq: Every day | ORAL | 0 refills | Status: DC

## 2019-01-28 MED ORDER — INSULIN GLARGINE 100 UNIT/ML ~~LOC~~ SOLN
8.0000 [IU] | Freq: Every day | SUBCUTANEOUS | Status: DC
  Filled 2019-01-28 (×2): qty 0.08

## 2019-01-28 MED ORDER — INSULIN ASPART 100 UNIT/ML ~~LOC~~ SOLN
0.0000 [IU] | Freq: Three times a day (TID) | SUBCUTANEOUS | Status: DC
  Administered 2019-01-29: 13:00:00 9 [IU] via SUBCUTANEOUS
  Filled 2019-01-28: qty 1

## 2019-01-28 MED ORDER — LEVOTHYROXINE SODIUM 50 MCG PO TABS
150.0000 ug | ORAL_TABLET | Freq: Every day | ORAL | Status: DC
  Administered 2019-01-29: 09:00:00 150 ug via ORAL
  Filled 2019-01-28: qty 1

## 2019-01-28 MED ORDER — ONDANSETRON HCL 4 MG PO TABS: 4.0000 mg | ORAL_TABLET | Freq: Four times a day (QID) | ORAL | Status: DC | PRN

## 2019-01-28 MED ORDER — SIMVASTATIN 20 MG PO TABS: 40.0000 mg | ORAL_TABLET | ORAL | Status: DC

## 2019-01-28 MED ORDER — GABAPENTIN 100 MG PO CAPS
200.0000 mg | ORAL_CAPSULE | Freq: Three times a day (TID) | ORAL | Status: DC
  Administered 2019-01-28 – 2019-01-29 (×3): 200 mg via ORAL
  Filled 2019-01-28 (×5): qty 2

## 2019-01-28 MED ORDER — ZINC SULFATE 220 (50 ZN) MG PO CAPS
220.0000 mg | ORAL_CAPSULE | Freq: Every day | ORAL | Status: DC
  Administered 2019-01-28 – 2019-01-29 (×2): 220 mg via ORAL
  Filled 2019-01-28 (×2): qty 1

## 2019-01-28 MED ORDER — LISINOPRIL 5 MG PO TABS
5.0000 mg | ORAL_TABLET | Freq: Every day | ORAL | Status: DC
  Administered 2019-01-28: 5 mg via ORAL
  Filled 2019-01-28: qty 1

## 2019-01-28 MED ORDER — ENOXAPARIN SODIUM 40 MG/0.4ML ~~LOC~~ SOLN
40.0000 mg | SUBCUTANEOUS | Status: DC
  Administered 2019-01-28: 40 mg via SUBCUTANEOUS
  Filled 2019-01-28: qty 0.4

## 2019-01-28 MED ORDER — MAGNESIUM CITRATE PO SOLN
1.0000 | Freq: Once | ORAL | Status: DC | PRN
  Filled 2019-01-28: qty 296

## 2019-01-28 MED ORDER — METHYLPREDNISOLONE SODIUM SUCC 125 MG IJ SOLR
60.0000 mg | INTRAMUSCULAR | Status: DC
  Administered 2019-01-28: 60 mg via INTRAVENOUS
  Filled 2019-01-28: qty 2

## 2019-01-28 MED ORDER — AZITHROMYCIN 250 MG PO TABS: ORAL_TABLET | ORAL | 0 refills | Status: DC

## 2019-01-28 MED ORDER — INSULIN ASPART 100 UNIT/ML ~~LOC~~ SOLN: 0.0000 [IU] | Freq: Every day | SUBCUTANEOUS | Status: DC

## 2019-01-28 MED ORDER — CO Q 10 100 MG PO CAPS: 1.0000 | ORAL_CAPSULE | Freq: Every day | ORAL | Status: DC

## 2019-01-28 MED ORDER — SODIUM CHLORIDE 0.9% FLUSH: 3.0000 mL | Freq: Once | INTRAVENOUS | Status: DC

## 2019-01-28 MED ORDER — HYDROCOD POLST-CPM POLST ER 10-8 MG/5ML PO SUER: 5.0000 mL | Freq: Two times a day (BID) | ORAL | Status: DC | PRN

## 2019-01-28 NOTE — H&P (Signed)
[facility] [place] [facility]   PATIENT NAME: Amy Calhoun    MR#:  [id number]  DATE OF BIRTH:  [birth year]  DATE OF ADMISSION:  01/28/2019  PRIMARY CARE PHYSICIAN: Allegra Grana, FNP   REQUESTING/REFERRING PHYSICIAN: Dr Larinda Buttery  Patient coming from : home. At baseline patient uses walker. Currently patient's daughter is living with her.   CHIEF COMPLAINT:  shortness of breath since last night  HISTORY OF PRESENT ILLNESS:  Amy Calhoun  is a 86 y.o. female with a known history of chronic kidney disease stage III, Gerd with history of peptic ulcer disease, hypertension, hyperlipidemia, type II diabetes on insulin comes to the emergency room brought in by patient's daughter with increasing shortness of breath. Patient was diagnosed with COVID-19 on January 7. She was overall stable at home. On and off was having some shortness of breath however last night according to the daughter patient was very weak. She was stroke had some shortness of breath and checked her O2 sats were down to 84% on room air. She denies any nausea vomiting. She denied any diarrhea. She is not having any chest pain.  ED course: the ER patient is hemodynamically stable. She is currently on 1.5 L nasal cannula oxygen sats are 99%. She is not in respiratory distress. Pressure is mildly elevated however her heart rate is stable. She received one dose of prednisone 40 mg. Chest x-ray does not show any florid changes of COVID pneumonia.  Patient is being admitted for acute hypoxic respiratory failure due to COVID 19. According to the daughter she does not have history of COPD or asthma.  PAST MEDICAL HISTORY:   Past Medical History:  Diagnosis Date  . Chronic kidney disease 08/07/2016   Chronic renal failure, stage III  . Depression   . Esophageal dysphagia   . Gastric ulcer   . GERD (gastroesophageal reflux disease)   . Hyperlipidemia   . Hypertension   . Hypothyroidism   . IDDM  (insulin dependent diabetes mellitus)   . Ischemic colitis (HCC)   . Osteoarthritis   . Ulcer of esophagus     PAST SURGICAL HISTOIRY:   Past Surgical History:  Procedure Laterality Date  . ABDOMINAL HYSTERECTOMY    . COLONOSCOPY  2010  . ESOPHAGEAL DILATION  08/07/2016   usually a couple times a year, last time 07/30/16  . GALLBLADDER SURGERY    . LUMBAR LAMINECTOMY/DECOMPRESSION MICRODISCECTOMY N/A 08/10/2016   Procedure: BILATERAL HEMILAMINECTOMY LUMBAR THREE-FOUR,LUMBAR FOUR-FIVE,LEFT LUMBAR FIVE-SACRAL ONE HEMILAMINECTOMY AND DECOMPRESSION;  Surgeon: Coletta Memos, MD;  Location: [facility] OR;  Service: Neurosurgery;  Laterality: N/A;  . THYROIDECTOMY      SOCIAL HISTORY:   Social History   Tobacco Use  . Smoking status: Never Smoker  . Smokeless tobacco: Never Used  Substance Use Topics  . Alcohol use: No    FAMILY HISTORY:   Family History  Problem Relation Age of Onset  . Diabetes Mother   . Arthritis Mother   . Hyperlipidemia Mother   . Mental illness Mother   . Heart disease Father   . Mental illness Sister   . Arthritis Maternal Grandmother   . Arthritis Maternal Grandfather   . Colon cancer Neg Hx   . Stomach cancer Neg Hx   . Esophageal cancer Neg Hx     DRUG ALLERGIES:   Allergies  Allergen Reactions  . Pravachol [Pravastatin] Other (See Comments)    States she refused due to side effects  REVIEW OF SYSTEMS:  Review of Systems  Constitutional: Negative for chills, fever and weight loss.  HENT: Negative for ear discharge, ear pain and nosebleeds.   Eyes: Negative for blurred vision, pain and discharge.  Respiratory: Positive for shortness of breath. Negative for sputum production, wheezing and stridor.   Cardiovascular: Negative for chest pain, palpitations, orthopnea and PND.  Gastrointestinal: Negative for abdominal pain, diarrhea, nausea and vomiting.  Genitourinary: Negative for frequency and urgency.  Musculoskeletal: Negative for back pain  and joint pain.  Neurological: Positive for weakness. Negative for sensory change, speech change and focal weakness.  Psychiatric/Behavioral: Negative for depression and hallucinations. The patient is not nervous/anxious.      MEDICATIONS AT HOME:   Prior to Admission medications   Medication Sig Start Date End Date Taking? Authorizing Provider  acetaminophen-codeine (TYLENOL #3) 300-30 MG tablet TAKE 1 TABLET TWICE A DAY AS NEEDED FOR SEVERE PAIN Patient taking differently: Take 1 tablet by mouth 2 (two) times daily as needed for severe pain.  01/07/19  Yes Arnett, Yvetta Coder, FNP  acidophilus (RISAQUAD) CAPS capsule Take 1 capsule by mouth daily.   Yes [provider]  albuterol (PROVENTIL HFA;VENTOLIN HFA) 108 (90 Base) MCG/ACT inhaler Inhale 2 puffs into the lungs every 6 (six) hours as needed for wheezing or shortness of breath. 01/07/17  Yes Burnard Hawthorne, FNP  amitriptyline (ELAVIL) 75 MG tablet Take 2 tablets (150 mg total) by mouth at bedtime. 01/02/19  Yes Burnard Hawthorne, FNP  BD INSULIN SYRINGE U/F 31G X 5/16" 1 ML MISC USE AS DIRECTED TO ADMINISTER INSULIN 07/12/17  Yes Burnard Hawthorne, FNP  Cholecalciferol (VITAMIN D) 2000 units tablet Take 2,000 Units by mouth daily.    Yes [provider]  Coenzyme Q10 (CO Q 10) 100 MG CAPS Take 1 capsule by mouth daily.   Yes [provider]  Continuous Blood Gluc Sensor (FREESTYLE LIBRE 14 DAY SENSOR) MISC 1 Package by Does not apply route every 14 (fourteen) days. 10/09/18  Yes Shamleffer, Melanie Crazier, MD  CRANBERRY PO Take 1 tablet by mouth every Monday, Wednesday, and Friday.   Yes [provider]  docusate sodium (COLACE) 100 MG capsule Take 1 capsule (100 mg total) by mouth 2 (two) times daily. Patient taking differently: Take 100 mg by mouth every other day.  03/31/16  Yes Theodoro Grist, MD  gabapentin (NEURONTIN) 100 MG capsule TAKE 2 CAPSULES THREE TIMES A DAY Patient taking differently:  Take 200 mg by mouth 3 (three) times daily. TAKE 2 CAPSULES THREE TIMES A DAY 08/13/18  Yes Arnett, Yvetta Coder, FNP  glucagon (GLUCAGON EMERGENCY) 1 MG injection Inject 1 mg into the vein once as needed. For hypoglycemic episodes. E11.42 03/13/16  Yes Cook, Jayce G, DO  Insulin Glargine (LANTUS SOLOSTAR) 100 UNIT/ML Solostar Pen Inject 8 Units into the skin at bedtime. 01/01/19  Yes Shamleffer, Melanie Crazier, MD  Insulin Pen Needle (BD PEN NEEDLE NANO U/F) 32G X 4 MM MISC 1 Device by Does not apply route daily. 10/07/18  Yes Shamleffer, Melanie Crazier, MD  insulin regular (HUMULIN R) 100 units/mL injection Inject 0.06 mLs (6 Units total) into the skin 3 (three) times daily before meals. 01/01/19  Yes Shamleffer, Melanie Crazier, MD  Insulin Syringe-Needle U-100 (BD INSULIN SYRINGE U/F) 31G X 5/16" 0.3 ML MISC Use four times a day. 05/28/18  Yes Shamleffer, Melanie Crazier, MD  levothyroxine (SYNTHROID, LEVOTHROID) 150 MCG tablet TAKE 1 TABLET DAILY Patient taking differently: Take 150 mcg  by mouth daily before breakfast.  12/30/17  Yes Arnett, Lyn Records, FNP  lisinopril (PRINIVIL,ZESTRIL) 10 MG tablet TAKE 1 TABLET DAILY Patient taking differently: Take 5 mg by mouth daily.  04/11/18  Yes Allegra Grana, FNP  omeprazole (PRILOSEC) 40 MG capsule Take 1 capsule (40 mg total) by mouth 2 (two) times daily. 06/27/18  Yes Rachael Fee, MD  ondansetron (ZOFRAN ODT) 4 MG disintegrating tablet Take 1 tablet (4 mg total) by mouth every 8 (eight) hours as needed for nausea or vomiting. 08/21/18  Yes Guse, Janna Arch, FNP  pyridOXINE (VITAMIN B-6) 100 MG tablet Take 100 mg by mouth daily.   Yes [provider]  riboflavin (VITAMIN B-2) 100 MG TABS tablet Take 100 mg by mouth daily.   Yes [provider]  simvastatin (ZOCOR) 40 MG tablet TAKE 1 TABLET AT BEDTIME Patient taking differently: Take 40 mg by mouth 3 (three) times a week. (at bedtime) 09/29/18  Yes Glori Luis, MD  Turmeric  500 MG TABS Take 750 mg by mouth 2 (two) times daily.   Yes [provider]  vitamin B-12 (CYANOCOBALAMIN) 500 MCG tablet Take 500 mcg by mouth daily.   Yes [provider]  azithromycin (ZITHROMAX Z-PAK) 250 MG tablet Take 2 tablets (500 mg) on  Day 1,  followed by 1 tablet (250 mg) once daily on Days 2 through 5. 01/28/19 02/02/19  Dionne Bucy, MD  predniSONE (DELTASONE) 20 MG tablet Take 2 tablets (40 mg total) by mouth daily for 4 days. Start the day after the ER visit 01/29/19 02/02/19  Dionne Bucy, MD      VITAL SIGNS:  Blood pressure (!) 176/66, pulse 91, temperature 98.7 F (37.1 C), temperature source Oral, resp. rate 20, height 5\' 4"  (1.626 m), weight 63.5 kg, SpO2 93 %.  PHYSICAL EXAMINATION:  GENERAL:  86 y.o.-year-old patient lying in the bed with no acute distress.  EYES: Pupils equal, round, reactive to light and accommodation. No scleral icterus.  HEENT: Head atraumatic, normocephalic. Oropharynx and nasopharynx clear. Very hard on hearing NECK:  Supple, no jugular venous distention. No thyroid enlargement, no tenderness.  LUNGS: Normal breath sounds bilaterally, no wheezing, rales,rhonchi or crepitation. No use of accessory muscles of respiration.  CARDIOVASCULAR: S1, S2 normal. No murmurs, rubs, or gallops.  ABDOMEN: Soft, nontender, nondistended. Bowel sounds present. No organomegaly or mass.  EXTREMITIES: No pedal edema, cyanosis, or clubbing.  NEUROLOGIC: Cranial nerves II through XII are intact. Muscle strength 5/5 in all extremities. Sensation intact. Gait not checked.  PSYCHIATRIC: The patient is alert and oriented x 2 SKIN: No obvious rash, lesion, or ulcer.   LABORATORY PANEL:   CBC Recent Labs  Lab 01/28/19 1147  WBC 5.1  HGB 11.3*  HCT 35.3*  PLT 199   ------------------------------------------------------------------------------------------------------------------  Chemistries  Recent Labs  Lab 01/28/19 1147  NA 135   K 3.9  CL 98  CO2 25  GLUCOSE 273*  BUN 22  CREATININE 0.98  CALCIUM 7.9*   ------------------------------------------------------------------------------------------------------------------  Cardiac Enzymes No results for input(s): TROPONINI in the last 168 hours. ------------------------------------------------------------------------------------------------------------------  RADIOLOGY:  DG Chest 2 View  Result Date: 01/28/2019 CLINICAL DATA:  Shortness of breath, COVID-19 positive EXAM: CHEST - 2 VIEW COMPARISON:  07/12/2016 FINDINGS: The heart size and mediastinal contours are within normal limits. Calcific aortic knob. Mild streaky and interstitial opacity within the left lung base. No pleural effusion. No pneumothorax. Chronic, healed left-sided rib fractures. No acute osseous findings. IMPRESSION: Streaky opacity  within the left lung base. While this may represent atelectasis or scarring, a developing infiltrate would be difficult exclude. Electronically Signed   By: Duanne Guess D.O.   On: 01/28/2019 12:30    EKG:    IMPRESSION AND PLAN:   Amy Calhoun  is a 86 y.o. female with a known history of chronic kidney disease stage III, Gerd with history of peptic ulcer disease, hypertension, hyperlipidemia, type II diabetes on insulin comes to the emergency room brought in by patient's daughter with increasing shortness of breath.  1. Acute hypoxic respiratory failure secondary to COVID-19 -admit patient for observation -IV Solu-Medrol 60 mg daily -check CRP, Pro calcitonin if elevated will consider further treatment -zinc, vitamin C and famotidine -vitamin -inhalers PRN -assess for home oxygen use. wean as tolerated -chest x-ray not impressive shows tricky opacity within the left lung base.  2. Diabetes mellitus type II, hyperglycemia, insulin requiring -continue home dose insulin with Lantus and short-acting NovoLog along with sliding scale -sugars may go up with  steroids  3. Chronic kidney disease stage III, suspected due to long-standing diabetes and hypertension -creatinine today stable at .9  4. Hypertension, uncontrolled -lisinopril -PRN hydralazine  5. Gerd/ History of gastric ulcer continue PPI  Anticipate discharge tomorrow if patient continues to remain stable able to wean off oxygen, consider assessment for home oxygen need and evaluate inflammatory markers for further need of treatment. Daughter in agreement with the plan.    Family Communication : daughter [name] Consults : none Code Status : full-- discussed with daughter who was [facility] POA DVT prophylaxis : Lovenox admission status: observation  TOTAL TIME TAKING CARE OF THIS PATIENT: *50* minutes.    Enedina Finner M.D on 01/28/2019 at 5:53 PM  Between 7am to 6pm - Pager - [phone]  After 6pm go to [email or web address] - password [email or web address] [facility]    CC: Primary care physician; Allegra Grana, FNP

## 2019-01-28 NOTE — ED Notes (Signed)
Pt ambulated to bathroom with this RN. Pt with steady gait. Pt oxygen saturation dropped to 85% on room air with ambulation. Pt assisted back to bed, and oxygen saturation returned to 94% on room air with deep breathing. MD Jessup aware and going to talk to family about preference for care plan for pt.

## 2019-01-28 NOTE — Progress Notes (Signed)
PHARMACIST - PHYSICIAN ORDER COMMUNICATION  CONCERNING: P&T Medication Policy on Herbal Medications  DESCRIPTION:  This patient's order for:  CO Q 10 100 MG PO CAPS  has been noted.  This product(s) is classified as an "herbal" or natural product. Due to a lack of definitive safety studies or FDA approval, nonstandard manufacturing practices, plus the potential risk of unknown drug-drug interactions while on inpatient medications, the Pharmacy and Therapeutics Committee does not permit the use of "herbal" or natural products of this type within [facility].   ACTION TAKEN: The pharmacy department is unable to verify this order at this time.  Please reevaluate patient's clinical condition at discharge and address if the herbal or natural product(s) should be resumed at that time.  Gardner Candle, PharmD, BCPS Clinical Pharmacist 01/28/2019 7:35 PM

## 2019-01-28 NOTE — Telephone Encounter (Signed)
She is in ed 

## 2019-01-28 NOTE — ED Notes (Signed)
Pt given meal tray and water to drink. MD Siadecki aware

## 2019-01-28 NOTE — Telephone Encounter (Signed)
I called & spoke with [name] to strongly urge her to take her mom to ED. She did not want to take her due to the long wait. I told her with an oxygen level in the 80's she needed to be on oxygen. She said last night 02 fell to 84% & she had her mom set up to take some deep breaths. She said all night 02 stayed moistly between 86-88%. She this morning it was 90%. Pt's daughter finally agreed to call 911 to have the ambulance take her in hopes of being seen sooner without having her wait in waiting room. I asked that she please keep [place] updated.

## 2019-01-28 NOTE — ED Provider Notes (Signed)
[facility] Emergency Department Provider Note ____________________________________________   First MD Initiated Contact with Patient 01/28/19 1248     (approximate)  I have reviewed the triage vital signs and the nursing notes.   HISTORY  Chief Complaint Weakness and Shortness of Breath    HPI Amy Calhoun is a 86 y.o. female with PMH as noted below and a recent diagnosis of COVID-19 who presents with worsened weakness, shortness of breath, and some low oxygen readings overnight.  The patient states that she has been sick for about 10 days, and had a positive COVID-19 test on 1/7.  She had been doing well at home, but over the last few days has had increased shortness of breath and generalized weakness.  She also notes some diarrhea.  The patient's daughter, who I spoke to on the phone, states that last night the patient became somewhat hypoxic to around 88% on room air.  However, the daughter states that the patient has also had this happen previously even before she had Covid.  Past Medical History:  Diagnosis Date  . Chronic kidney disease 08/07/2016   Chronic renal failure, stage III  . Depression   . Esophageal dysphagia   . Gastric ulcer   . GERD (gastroesophageal reflux disease)   . Hyperlipidemia   . Hypertension   . Hypothyroidism   . IDDM (insulin dependent diabetes mellitus)   . Ischemic colitis (HCC)   . Osteoarthritis   . Ulcer of esophagus     Patient Active Problem List   Diagnosis Date Noted  . Insomnia 01/05/2019  . Constipation 06/02/2018  . Acute pain of left shoulder 05/23/2018  . Diarrhea 05/23/2018  . Pneumatosis coli 07/29/2017  . 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  . Memory disturbance 03/27/2017  . Recurrent urinary tract infection 10/17/2016  . Lumbar stenosis with neurogenic claudication 08/10/2016  . Anemia 07/06/2016  . Osteoarthritis of spine with radiculopathy, lumbar region 03/16/2016  . History of colitis  03/02/2016  . Primary osteoarthritis of left hip 03/02/2016  . CKD (chronic kidney disease) stage 3, GFR 30-59 ml/min (HCC) 02/21/2016  . DM type 2 with diabetic peripheral neuropathy (HCC) 01/19/2016  . Hyperlipidemia 01/19/2016  . Hypothyroidism 01/19/2016  . Essential hypertension 01/19/2016  . Chronic pain 01/19/2016  . Esophageal dysmotility 12/21/2015    Past Surgical History:  Procedure Laterality Date  . ABDOMINAL HYSTERECTOMY    . COLONOSCOPY  2010  . ESOPHAGEAL DILATION  08/07/2016   usually a couple times a year, last time 07/30/16  . GALLBLADDER SURGERY    . LUMBAR LAMINECTOMY/DECOMPRESSION MICRODISCECTOMY N/A 08/10/2016   Procedure: BILATERAL HEMILAMINECTOMY LUMBAR THREE-FOUR,LUMBAR FOUR-FIVE,LEFT LUMBAR FIVE-SACRAL ONE HEMILAMINECTOMY AND DECOMPRESSION;  Surgeon: Coletta Memos, MD;  Location: [facility] OR;  Service: Neurosurgery;  Laterality: N/A;  . THYROIDECTOMY      Prior to Admission medications   Medication Sig Start Date End Date Taking? Authorizing Provider  acetaminophen-codeine (TYLENOL #3) 300-30 MG tablet TAKE 1 TABLET TWICE A DAY AS NEEDED FOR SEVERE PAIN Patient taking differently: Take 1 tablet by mouth 2 (two) times daily as needed for severe pain.  01/07/19  Yes Arnett, Lyn Records, FNP  acidophilus (RISAQUAD) CAPS capsule Take 1 capsule by mouth daily.   Yes [provider]  albuterol (PROVENTIL HFA;VENTOLIN HFA) 108 (90 Base) MCG/ACT inhaler Inhale 2 puffs into the lungs every 6 (six) hours as needed for wheezing or shortness of breath. 01/07/17  Yes Allegra Grana, FNP  amitriptyline (ELAVIL) 75 MG tablet Take  2 tablets (150 mg total) by mouth at bedtime. 01/02/19  Yes Allegra Grana, FNP  BD INSULIN SYRINGE U/F 31G X 5/16" 1 ML MISC USE AS DIRECTED TO ADMINISTER INSULIN 07/12/17  Yes Allegra Grana, FNP  Cholecalciferol (VITAMIN D) 2000 units tablet Take 2,000 Units by mouth daily.    Yes [provider]  Coenzyme Q10 (CO Q 10) 100  MG CAPS Take 1 capsule by mouth daily.   Yes [provider]  Continuous Blood Gluc Sensor (FREESTYLE LIBRE 14 DAY SENSOR) MISC 1 Package by Does not apply route every 14 (fourteen) days. 10/09/18  Yes Shamleffer, Konrad Dolores, MD  CRANBERRY PO Take 1 tablet by mouth every Monday, Wednesday, and Friday.   Yes [provider]  docusate sodium (COLACE) 100 MG capsule Take 1 capsule (100 mg total) by mouth 2 (two) times daily. Patient taking differently: Take 100 mg by mouth every other day.  03/31/16  Yes Katharina Caper, MD  gabapentin (NEURONTIN) 100 MG capsule TAKE 2 CAPSULES THREE TIMES A DAY Patient taking differently: Take 200 mg by mouth 3 (three) times daily. TAKE 2 CAPSULES THREE TIMES A DAY 08/13/18  Yes Arnett, Lyn Records, FNP  glucagon (GLUCAGON EMERGENCY) 1 MG injection Inject 1 mg into the vein once as needed. For hypoglycemic episodes. E11.42 03/13/16  Yes Cook, Jayce G, DO  Insulin Glargine (LANTUS SOLOSTAR) 100 UNIT/ML Solostar Pen Inject 8 Units into the skin at bedtime. 01/01/19  Yes Shamleffer, Konrad Dolores, MD  Insulin Pen Needle (BD PEN NEEDLE NANO U/F) 32G X 4 MM MISC 1 Device by Does not apply route daily. 10/07/18  Yes Shamleffer, Konrad Dolores, MD  insulin regular (HUMULIN R) 100 units/mL injection Inject 0.06 mLs (6 Units total) into the skin 3 (three) times daily before meals. 01/01/19  Yes Shamleffer, Konrad Dolores, MD  Insulin Syringe-Needle U-100 (BD INSULIN SYRINGE U/F) 31G X 5/16" 0.3 ML MISC Use four times a day. 05/28/18  Yes Shamleffer, Konrad Dolores, MD  levothyroxine (SYNTHROID, LEVOTHROID) 150 MCG tablet TAKE 1 TABLET DAILY Patient taking differently: Take 150 mcg by mouth daily before breakfast.  12/30/17  Yes Arnett, Lyn Records, FNP  lisinopril (PRINIVIL,ZESTRIL) 10 MG tablet TAKE 1 TABLET DAILY Patient taking differently: Take 5 mg by mouth daily.  04/11/18  Yes Allegra Grana, FNP  omeprazole (PRILOSEC) 40 MG capsule Take 1 capsule  (40 mg total) by mouth 2 (two) times daily. 06/27/18  Yes Rachael Fee, MD  ondansetron (ZOFRAN ODT) 4 MG disintegrating tablet Take 1 tablet (4 mg total) by mouth every 8 (eight) hours as needed for nausea or vomiting. 08/21/18  Yes Guse, Janna Arch, FNP  pyridOXINE (VITAMIN B-6) 100 MG tablet Take 100 mg by mouth daily.   Yes [provider]  riboflavin (VITAMIN B-2) 100 MG TABS tablet Take 100 mg by mouth daily.   Yes [provider]  simvastatin (ZOCOR) 40 MG tablet TAKE 1 TABLET AT BEDTIME Patient taking differently: Take 40 mg by mouth 3 (three) times a week. (at bedtime) 09/29/18  Yes Glori Luis, MD  Turmeric 500 MG TABS Take 750 mg by mouth 2 (two) times daily.   Yes [provider]  vitamin B-12 (CYANOCOBALAMIN) 500 MCG tablet Take 500 mcg by mouth daily.   Yes [provider]  azithromycin (ZITHROMAX Z-PAK) 250 MG tablet Take 2 tablets (500 mg) on  Day 1,  followed by 1 tablet (250 mg) once daily on Days 2 through 5. 01/28/19  02/02/19  Dionne BucySiadecki, Tiyana Galla, MD  predniSONE (DELTASONE) 20 MG tablet Take 2 tablets (40 mg total) by mouth daily for 4 days. Start the day after the ER visit 01/29/19 02/02/19  Dionne BucySiadecki, Jerae Izard, MD    Allergies Pravachol [pravastatin]  Family History  Problem Relation Age of Onset  . Diabetes Mother   . Arthritis Mother   . Hyperlipidemia Mother   . Mental illness Mother   . Heart disease Father   . Mental illness Sister   . Arthritis Maternal Grandmother   . Arthritis Maternal Grandfather   . Colon cancer Neg Hx   . Stomach cancer Neg Hx   . Esophageal cancer Neg Hx     Social History Social History   Tobacco Use  . Smoking status: Never Smoker  . Smokeless tobacco: Never Used  Substance Use Topics  . Alcohol use: No  . Drug use: No    Review of Systems  Constitutional: No fever.  Positive for weakness. Eyes: No redness. ENT: No sore throat. Cardiovascular: Denies chest pain. Respiratory:  Positive for shortness of breath. Gastrointestinal: No vomiting.  Positive for diarrhea.  Genitourinary: Negative for dysuria.  Musculoskeletal: Positive for back pain. Skin: Negative for rash. Neurological: Negative for headache.   ____________________________________________   PHYSICAL EXAM:  VITAL SIGNS: ED Triage Vitals  Enc Vitals Group     BP 01/28/19 1141 (!) 152/65     Pulse Rate 01/28/19 1141 75     Resp 01/28/19 1141 20     Temp 01/28/19 1141 98.7 F (37.1 C)     Temp Source 01/28/19 1141 Oral     SpO2 01/28/19 1141 97 %     Weight 01/28/19 1136 140 lb (63.5 kg)     Height 01/28/19 1136 5\' 4"  (1.626 m)     Head Circumference --      Peak Flow --      Pain Score 01/28/19 1136 5     Pain Loc --      Pain Edu? --      Excl. in GC? --     Constitutional: Alert and oriented. Well appearing for age and in no acute distress. Eyes: Conjunctivae are normal.  Head: Atraumatic. Nose: No congestion/rhinnorhea. Mouth/Throat: Mucous membranes are slightly dry.   Neck: Normal range of motion.  Cardiovascular: Normal rate, regular rhythm. Grossly normal heart sounds.  Good peripheral circulation. Respiratory: Normal respiratory effort.  No retractions. Lungs CTAB. Gastrointestinal: Soft and nontender. No distention.  Genitourinary: No CVA tenderness. Musculoskeletal: No lower extremity edema.  Extremities warm and well perfused.  Neurologic:  Normal speech and language. No gross focal neurologic deficits are appreciated.  Skin:  Skin is warm and dry. No rash noted. Psychiatric: Mood and affect are normal. Speech and behavior are normal.  ____________________________________________   LABS (all labs ordered are listed, but only abnormal results are displayed)  Labs Reviewed  BASIC METABOLIC PANEL - Abnormal; Notable for the following components:      Result Value   Glucose, Bld 273 (*)    Calcium 7.9 (*)    GFR calc non Af Amer 52 (*)    GFR calc Af Amer 60 (*)     All other components within normal limits  CBC - Abnormal; Notable for the following components:   Hemoglobin 11.3 (*)    HCT 35.3 (*)    All other components within normal limits  URINALYSIS, COMPLETE (UACMP) WITH MICROSCOPIC   ____________________________________________  EKG  ED ECG REPORT I, Wilmon PaliSebastian  Abrey Bradway, the attending physician, personally viewed and interpreted this ECG.  Date: 01/28/2019 EKG Time: 1143 Rate:  77 Rhythm: normal sinus rhythm QRS Axis: normal Intervals: RBBB ST/T Wave abnormalities: normal Narrative Interpretation: no evidence of acute ischemia  ____________________________________________  RADIOLOGY  CXR: Left lung base opacity  ____________________________________________   PROCEDURES  Procedure(s) performed: No  Procedures  Critical Care performed: No ____________________________________________   INITIAL IMPRESSION / ASSESSMENT AND PLAN / ED COURSE  Pertinent labs & imaging results that were available during my care of the patient were reviewed by me and considered in my medical decision making (see chart for details).  86 year old female with PMH as noted above (and no prior COPD or CHF history) presents with worsening weakness and shortness of breath after recently being diagnosed with COVID-19.  I reviewed the past medical records in Epic.  The patient was diagnosed with COVID-19 from a test on 1/7.  Per telephone encounter notes over the last day, the daughter has been in touch with the PMD office and reported that the patient had an O2 saturation in the high 80s last night.  She was instructed to come to the ED for evaluation.  However, the daughter told me that the patient has had similar episodes in the past even before her Covid diagnosis and then they had been looking into possible home O2 treatment at night.  On exam, the patient is very well-appearing for her age.  She has no significant increased work of breathing or  respiratory distress at this time.  Her vital signs are normal except for mild hypertension.  O2 saturation is in the mid to low 90s on room air.  Work-up obtained from triage is reassuring.  The patient does have some streaky opacity in the left lower lung, but her lab work-up is unremarkable other than elevated glucose.    Overall, based on the patient's clinical status and relatively good oxygenation, I do not think that she will require admission.  The patient states that she would like to go home, and the daughter also expressed a strong preference for the patient to be discharged home if possible.  We will give a fluid bolus, a dose of steroid, and observe the patient for a few hours.  If she continues to do relatively well anticipate discharge home.  ----------------------------------------- 3:13 PM on 01/28/2019 -----------------------------------------  The patient is receiving fluids and appears comfortable.  O2 saturation remains in the low to mid 90s on room air.  I have signed her out to the oncoming physician Dr. Larinda Buttery with a plan for likely discharge home if she remains stable. _______________________________  Amy Calhoun was evaluated in Emergency Department on 01/28/2019 for the symptoms described in the history of present illness. She was evaluated in the context of the global COVID-19 pandemic, which necessitated consideration that the patient might be at risk for infection with the SARS-CoV-2 virus that causes COVID-19. Institutional protocols and algorithms that pertain to the evaluation of patients at risk for COVID-19 are in a state of rapid change based on information released by regulatory bodies including the CDC and federal and state organizations. These policies and algorithms were followed during the patient's care in the ED.    ____________________________________________   FINAL CLINICAL IMPRESSION(S) / ED DIAGNOSES  Final diagnoses:  None      NEW  MEDICATIONS STARTED DURING THIS VISIT:  New Prescriptions   AZITHROMYCIN (ZITHROMAX Z-PAK) 250 MG TABLET    Take 2 tablets (500 mg) on  Day 1,  followed by 1 tablet (250 mg) once daily on Days 2 through 5.   PREDNISONE (DELTASONE) 20 MG TABLET    Take 2 tablets (40 mg total) by mouth daily for 4 days. Start the day after the ER visit     Note:  This document was prepared using Dragon voice recognition software and may include unintentional dictation errors.    Arta Silence, MD 01/28/19 1513

## 2019-01-28 NOTE — Telephone Encounter (Signed)
PT's daughter called and stated her mother's oxygen level was 90%, she also states she has shortness breathe. Pt's daughter wondered what she could do for her. She was advised take  Her  Mother to ED. Daughter was not sure if she wanted to do that. Please advise, [phone].

## 2019-01-28 NOTE — ED Provider Notes (Signed)
-----------------------------------------   3:01 PM on 01/28/2019 -----------------------------------------  Blood pressure (!) 152/65, pulse 75, temperature 98.7 F (37.1 C), temperature source Oral, resp. rate 20, height 5\' 4"  (1.626 m), weight 63.5 kg, SpO2 97 %.  Assuming care from Dr. .  In short, Amy Calhoun is a 86 y.o. female with a chief complaint of Weakness and Shortness of Breath .  Refer to the original H&P for additional details.  The current plan of care is to reevaluate following fluid bolus and UA results, COVID positive and not requiring oxygen.  Patient noted to have O2 sats of 90 to 91% on room air.  She subsequently ambulated to the bathroom but became very short of breath on returning with O2 sats of 85%.  She was dyspneic at that time but was able to bring her O2 sats back up to 90 to 91%.  Given significant shortness of breath with minimal exertion, will admit for further management of COVID-19.    98, MD 01/28/19 1731

## 2019-01-28 NOTE — Discharge Instructions (Signed)
Take the prednisone daily for 4 days starting the day after the ER visit (the first dose was given today in the ER) as well as the azithromycin as prescribed.  You may take over-the-counter cough medication containing dextromethorphan, and Tylenol as needed for fever.  Continue to monitor your breathing and oxygen level.  You should return to the ER for sustained oxygen levels below 90%, worsening shortness of breath or weakness, or any other new or worsening symptoms that concern you.

## 2019-01-28 NOTE — ED Triage Notes (Signed)
Pt presents to ED via [facility] from [facility]. Pt tested +for Covid on 01/24/2019. Pt c/o increasing weakness and SOB since testing positive for Covid.

## 2019-01-28 NOTE — ED Notes (Signed)
ED TO INPATIENT HANDOFF REPORT  ED Nurse Name and Phone #: Cala Bradford [phone]  S Name/Age/Gender Amy Calhoun 86 y.o. female Room/Bed: ED36A/ED36A  Code Status   Code Status: Full Code  Home/SNF/Other Home Patient oriented to: self, place, time and situation Is this baseline? Yes   Triage Complete: Triage complete  Chief Complaint Acute hypoxemic respiratory failure due to COVID-19 (HCC) [U07.1, J96.01]  Triage Note In via EMS from home COVID + for the last 10 days. Pt continues to get weaker and weaker. 96%RA  Pt presents to ED via [facility] from [facility]. Pt tested +for Covid on 01/24/2019. Pt c/o increasing weakness and SOB since testing positive for Covid.     Allergies Allergies  Allergen Reactions  . Pravachol [Pravastatin] Other (See Comments)    States she refused due to side effects    Level of Care/Admitting Diagnosis ED Disposition    ED Disposition Condition Comment   Admit  Hospital Area: [facility] [[id number]]  Level of Care: Med-Surg [16]  Covid Evaluation: Confirmed COVID Positive  Diagnosis: Acute hypoxemic respiratory failure due to COVID-19 [facility]) [[id number]]  Admitting Physician: Joselyn Glassman  Attending Physician: Joselyn Glassman       B Medical/Surgery History Past Medical History:  Diagnosis Date  . Chronic kidney disease 08/07/2016   Chronic renal failure, stage III  . Depression   . Esophageal dysphagia   . Gastric ulcer   . GERD (gastroesophageal reflux disease)   . Hyperlipidemia   . Hypertension   . Hypothyroidism   . IDDM (insulin dependent diabetes mellitus)   . Ischemic colitis (HCC)   . Osteoarthritis   . Ulcer of esophagus    Past Surgical History:  Procedure Laterality Date  . ABDOMINAL HYSTERECTOMY    . COLONOSCOPY  2010  . ESOPHAGEAL DILATION  08/07/2016   usually a couple times a year, last time 07/30/16  . GALLBLADDER SURGERY    . LUMBAR LAMINECTOMY/DECOMPRESSION  MICRODISCECTOMY N/A 08/10/2016   Procedure: BILATERAL HEMILAMINECTOMY LUMBAR THREE-FOUR,LUMBAR FOUR-FIVE,LEFT LUMBAR FIVE-SACRAL ONE HEMILAMINECTOMY AND DECOMPRESSION;  Surgeon: Coletta Memos, MD;  Location: [facility] OR;  Service: Neurosurgery;  Laterality: N/A;  . THYROIDECTOMY       A IV Location/Drains/Wounds Patient Lines/Drains/Airways Status   Active Line/Drains/Airways    Name:   Placement date:   Placement time:   Site:   Days:   Peripheral IV 07/12/16 Left Antecubital   07/12/16    1314    Antecubital   930   Peripheral IV 01/28/19 Right Antecubital   01/28/19    1352    Antecubital   less than 1   Incision (Closed) 08/10/16 Back Other (Comment)   08/10/16    1030     901          Intake/Output Last 24 hours No intake or output data in the 24 hours ending 01/28/19 2251  Labs/Imaging Results for orders placed or performed during the hospital encounter of 01/28/19 (from the past 48 hour(s))  Basic metabolic panel     Status: Abnormal   Collection Time: 01/28/19 11:47 AM  Result Value Ref Range   Sodium 135 135 - 145 mmol/L   Potassium 3.9 3.5 - 5.1 mmol/L   Chloride 98 98 - 111 mmol/L   CO2 25 22 - 32 mmol/L   Glucose, Bld 273 (H) 70 - 99 mg/dL   BUN 22 8 - 23 mg/dL   Creatinine, Ser 2.11 0.44 - 1.00 mg/dL  Calcium 7.9 (L) 8.9 - 10.3 mg/dL   GFR calc non Af Amer 52 (L) >60 mL/min   GFR calc Af Amer 60 (L) >60 mL/min   Anion gap 12 5 - 15    Comment: Performed at [facility], [address] [place], [place] [address]  CBC     Status: Abnormal   Collection Time: 01/28/19 11:47 AM  Result Value Ref Range   WBC 5.1 4.0 - 10.5 K/uL   RBC 3.94 3.87 - 5.11 MIL/uL   Hemoglobin 11.3 (L) 12.0 - 15.0 g/dL   HCT 35.3 (L) 36.0 - 46.0 %   MCV 89.6 80.0 - 100.0 fL   MCH 28.7 26.0 - 34.0 pg   MCHC 32.0 30.0 - 36.0 g/dL   RDW 12.8 11.5 - 15.5 %   Platelets 199 150 - 400 K/uL   nRBC 0.0 0.0 - 0.2 %    Comment: Performed at [facility], [address]  [place], [place] [address]  Procalcitonin - Baseline     Status: None   Collection Time: 01/28/19 11:47 AM  Result Value Ref Range   Procalcitonin <0.10 ng/mL    Comment:        Interpretation: PCT (Procalcitonin) <= 0.5 ng/mL: Systemic infection (sepsis) is not likely. Local bacterial infection is possible. (NOTE)       Sepsis PCT Algorithm           Lower Respiratory Tract                                      Infection PCT Algorithm    ----------------------------     ----------------------------         PCT < 0.25 ng/mL                PCT < 0.10 ng/mL         Strongly encourage             Strongly discourage   discontinuation of antibiotics    initiation of antibiotics    ----------------------------     -----------------------------       PCT 0.25 - 0.50 ng/mL            PCT 0.10 - 0.25 ng/mL               OR       >80% decrease in PCT            Discourage initiation of                                            antibiotics      Encourage discontinuation           of antibiotics    ----------------------------     -----------------------------         PCT >= 0.50 ng/mL              PCT 0.26 - 0.50 ng/mL               AND        <80% decrease in PCT             Encourage initiation of  antibiotics       Encourage continuation           of antibiotics    ----------------------------     -----------------------------        PCT >= 0.50 ng/mL                  PCT > 0.50 ng/mL               AND         increase in PCT                  Strongly encourage                                      initiation of antibiotics    Strongly encourage escalation           of antibiotics                                     -----------------------------                                           PCT <= 0.25 ng/mL                                                 OR                                        > 80% decrease in PCT                                      Discontinue / Do not initiate                                             antibiotics Performed at [facility], [address], [place], [place] [address]   ABO/Rh     Status: None   Collection Time: 01/28/19 11:47 AM  Result Value Ref Range   ABO/RH(D)      B NEG Performed at [facility], [address] [place], [place] [address]   Urinalysis, Complete w Microscopic     Status: Abnormal   Collection Time: 01/28/19  8:56 PM  Result Value Ref Range   Color, Urine YELLOW (A) YELLOW   APPearance HAZY (A) CLEAR   Specific Gravity, Urine 1.016 1.005 - 1.030   pH 6.0 5.0 - 8.0   Glucose, UA >=500 (A) NEGATIVE mg/dL   Hgb urine dipstick NEGATIVE NEGATIVE   Bilirubin Urine NEGATIVE NEGATIVE   Ketones, ur 5 (A) NEGATIVE mg/dL   Protein, ur 30 (A) NEGATIVE mg/dL   Nitrite NEGATIVE NEGATIVE   Leukocytes,Ua MODERATE (A) NEGATIVE   RBC / HPF 0-5 0 - 5 RBC/hpf   WBC, UA >50 (  H) 0 - 5 WBC/hpf   Bacteria, UA RARE (A) NONE SEEN   Squamous Epithelial / LPF 0-5 0 - 5    Comment: Performed at [facility], [address] [place], [place] [address]   DG Chest 2 View  Result Date: 01/28/2019 CLINICAL DATA:  Shortness of breath, COVID-19 positive EXAM: CHEST - 2 VIEW COMPARISON:  07/12/2016 FINDINGS: The heart size and mediastinal contours are within normal limits. Calcific aortic knob. Mild streaky and interstitial opacity within the left lung base. No pleural effusion. No pneumothorax. Chronic, healed left-sided rib fractures. No acute osseous findings. IMPRESSION: Streaky opacity within the left lung base. While this may represent atelectasis or scarring, a developing infiltrate would be difficult exclude. Electronically Signed   By: Duanne Guess D.O.   On: 01/28/2019 12:30    Pending Labs Unresulted Labs (From admission, onward)    Start     Ordered   02/04/19 0500  Creatinine, serum  (enoxaparin (LOVENOX)    CrCl >/= 30 ml/min)  Weekly,   STAT     Comments: while on enoxaparin therapy    01/28/19 1921   01/28/19 1922  CBC  (enoxaparin (LOVENOX)    CrCl >/= 30 ml/min)  Once,   STAT    Comments: Baseline for enoxaparin therapy IF NOT ALREADY DRAWN.  Notify MD if PLT < 100 K.    01/28/19 1921   01/28/19 1922  Creatinine, serum  (enoxaparin (LOVENOX)    CrCl >/= 30 ml/min)  Once,   STAT    Comments: Baseline for enoxaparin therapy IF NOT ALREADY DRAWN.    01/28/19 1921   01/28/19 1731  C-reactive protein  Add-on,   AD     01/28/19 1730          Vitals/Pain Today's Vitals   01/28/19 1930 01/28/19 2000 01/28/19 2030 01/28/19 2044  BP: (!) 176/69 (!) 153/128 (!) 171/70 (!) 171/70  Pulse:   89 89  Resp:    18  Temp:      TempSrc:      SpO2:   94% 95%  Weight:      Height:      PainSc:        Isolation Precautions Airborne and Contact precautions  Medications Medications  sodium chloride flush (NS) 0.9 % injection 3 mL (3 mLs Intravenous Refused 01/28/19 1314)  predniSONE (DELTASONE) tablet 40 mg (40 mg Oral Given 01/28/19 1351)  lisinopril (ZESTRIL) tablet 5 mg (5 mg Oral Given 01/28/19 2044)  simvastatin (ZOCOR) tablet 40 mg (has no administration in time range)  amitriptyline (ELAVIL) tablet 150 mg (150 mg Oral Given 01/28/19 2045)  insulin glargine (LANTUS) injection 8 Units (has no administration in time range)  insulin aspart (novoLOG) injection 6 Units (has no administration in time range)  levothyroxine (SYNTHROID) tablet 150 mcg (has no administration in time range)  acidophilus (RISAQUAD) capsule 1 capsule (1 capsule Oral Given 01/28/19 2047)  docusate sodium (COLACE) capsule 100 mg (100 mg Oral Refused 01/28/19 2051)  pantoprazole (PROTONIX) EC tablet 40 mg (40 mg Oral Given 01/28/19 2044)  vitamin B-12 (CYANOCOBALAMIN) tablet 500 mcg (has no administration in time range)  gabapentin (NEURONTIN) capsule 200 mg (200 mg Oral Given 01/28/19 2047)  albuterol (VENTOLIN HFA) 108 (90 Base) MCG/ACT inhaler 2 puff (has no  administration in time range)  enoxaparin (LOVENOX) injection 40 mg (40 mg Subcutaneous Given 01/28/19 2049)  Ipratropium-Albuterol (COMBIVENT) respimat 1 puff (1 puff Inhalation Given 01/28/19 2053)  guaiFENesin-dextromethorphan (ROBITUSSIN DM)  100-10 MG/5ML syrup 10 mL (has no administration in time range)  chlorpheniramine-HYDROcodone (TUSSIONEX) 10-8 MG/5ML suspension 5 mL (has no administration in time range)  ascorbic acid (VITAMIN C) tablet 500 mg (500 mg Oral Given 01/28/19 2047)  zinc sulfate capsule 220 mg (220 mg Oral Given 01/28/19 2044)  acetaminophen (TYLENOL) tablet 650 mg (has no administration in time range)  magnesium citrate solution 1 Bottle (has no administration in time range)  ondansetron (ZOFRAN) tablet 4 mg (has no administration in time range)    Or  ondansetron (ZOFRAN) injection 4 mg (has no administration in time range)  methylPREDNISolone sodium succinate (SOLU-MEDROL) 125 mg/2 mL injection 60 mg (60 mg Intravenous Given 01/28/19 2050)  insulin aspart (novoLOG) injection 0-9 Units (has no administration in time range)  insulin aspart (novoLOG) injection 0-5 Units (has no administration in time range)  sodium chloride 0.9 % bolus 500 mL (0 mLs Intravenous Stopped 01/28/19 1720)    Mobility walks Low fall risk   Focused Assessments Pulmonary Assessment Handoff:  Lung sounds:   O2 Device: Room Air        R Recommendations: See Admitting Provider Note  Report given to:   Additional Notes: COVID positive

## 2019-01-28 NOTE — ED Notes (Signed)
Pt placed on 2L nasal cannula per MD [place]. Oxygen saturation now 99% on 2L.

## 2019-01-28 NOTE — ED Triage Notes (Signed)
In via EMS from home COVID + for the last 10 days. Pt continues to get weaker and weaker. 96%RA

## 2019-01-29 ENCOUNTER — Encounter: Payer: Self-pay | Admitting: Internal Medicine

## 2019-01-29 ENCOUNTER — Telehealth: Payer: Self-pay | Admitting: Family

## 2019-01-29 DIAGNOSIS — R0602 Shortness of breath: Secondary | ICD-10-CM

## 2019-01-29 DIAGNOSIS — U071 COVID-19: Secondary | ICD-10-CM | POA: Diagnosis not present

## 2019-01-29 DIAGNOSIS — E1165 Type 2 diabetes mellitus with hyperglycemia: Secondary | ICD-10-CM | POA: Diagnosis not present

## 2019-01-29 DIAGNOSIS — E1129 Type 2 diabetes mellitus with other diabetic kidney complication: Secondary | ICD-10-CM | POA: Diagnosis not present

## 2019-01-29 DIAGNOSIS — K219 Gastro-esophageal reflux disease without esophagitis: Secondary | ICD-10-CM | POA: Diagnosis not present

## 2019-01-29 DIAGNOSIS — J9601 Acute respiratory failure with hypoxia: Secondary | ICD-10-CM | POA: Diagnosis not present

## 2019-01-29 DIAGNOSIS — I10 Essential (primary) hypertension: Secondary | ICD-10-CM | POA: Diagnosis not present

## 2019-01-29 LAB — CBC
HCT: 35.5 % — ABNORMAL LOW (ref 36.0–46.0)
Hemoglobin: 11.7 g/dL — ABNORMAL LOW (ref 12.0–15.0)
MCH: 28.8 pg (ref 26.0–34.0)
MCHC: 33 g/dL (ref 30.0–36.0)
MCV: 87.4 fL (ref 80.0–100.0)
Platelets: 220 10*3/uL (ref 150–400)
RBC: 4.06 MIL/uL (ref 3.87–5.11)
RDW: 12.4 % (ref 11.5–15.5)
WBC: 2.2 10*3/uL — ABNORMAL LOW (ref 4.0–10.5)
nRBC: 0 % (ref 0.0–0.2)

## 2019-01-29 LAB — CREATININE, SERUM
Creatinine, Ser: 0.95 mg/dL (ref 0.44–1.00)
GFR calc Af Amer: >60 mL/min (ref >60)
GFR calc non Af Amer: 53 mL/min — ABNORMAL LOW (ref >60)

## 2019-01-29 LAB — GLUCOSE, CAPILLARY
Glucose-Capillary: 412 mg/dL — ABNORMAL HIGH (ref 70–99)
Glucose-Capillary: 364 mg/dL — ABNORMAL HIGH (ref 70–99)
Glucose-Capillary: 436 mg/dL — ABNORMAL HIGH (ref 70–99)

## 2019-01-29 LAB — C-REACTIVE PROTEIN: CRP: 14.8 mg/dL — ABNORMAL HIGH (ref <1.0)

## 2019-01-29 MED ORDER — GUAIFENESIN-DM 100-10 MG/5ML PO SYRP: 10.0000 mL | ORAL_SOLUTION | ORAL | 0 refills | Status: DC | PRN

## 2019-01-29 MED ORDER — PREDNISONE 20 MG PO TABS: 40.0000 mg | ORAL_TABLET | Freq: Every day | ORAL | 0 refills | Status: AC

## 2019-01-29 MED ORDER — SIMVASTATIN 20 MG PO TABS: 40.0000 mg | ORAL_TABLET | ORAL | Status: DC

## 2019-01-29 MED ORDER — INSULIN ASPART 100 UNIT/ML ~~LOC~~ SOLN
25.0000 [IU] | Freq: Once | SUBCUTANEOUS | Status: AC
  Administered 2019-01-29: 25 [IU] via SUBCUTANEOUS

## 2019-01-29 MED ORDER — INFLUENZA VAC A&B SA ADJ QUAD 0.5 ML IM PRSY: 0.5000 mL | PREFILLED_SYRINGE | INTRAMUSCULAR | Status: DC

## 2019-01-29 MED ORDER — LISINOPRIL 10 MG PO TABS
10.0000 mg | ORAL_TABLET | Freq: Every day | ORAL | Status: DC
  Administered 2019-01-29: 09:00:00 10 mg via ORAL
  Filled 2019-01-29: qty 1

## 2019-01-29 NOTE — Plan of Care (Signed)

## 2019-01-29 NOTE — TOC Initial Note (Signed)
Transition of Care [facility]) - Initial/Assessment Note    Patient Details  Name: Amy Calhoun MRN: [id number] Date of Birth: 1928/07/18  Transition of [facility]) CM/SW Contact:    Shelbie Hutching, RN Phone Number: 01/29/2019, 1:12 PM  Clinical Narrative:                 Patient placed in observation for COVID, ready for discharge today.  Patient needs O2 at home.  Oxygen ordered and order sent to [facility].  [facility] will provide oxygen equipment and deliver to the patient's room.   Daughter, [name], is coming to pick patient up at discharge and will be staying with patient to help her at home.  Patient is from [facility], an independent living facility.  Daughter reports that the community provides transportation, does grocery shopping once per week for the residents and provides one meal per day.  Patient has all needed DME at home.   RNCM will provide pulse oximeter and thermometer kit to patient- RNCM will deliver to the patient's bedside RN to give to patient at discharge.   Expected Discharge Plan: Home/Self Care Barriers to Discharge: Barriers Resolved   Patient Goals and CMS Choice        Expected Discharge Plan and Services Expected Discharge Plan: Home/Self Care   Discharge Planning Services: CM Consult   Living arrangements for the past 2 months: Apartment Expected Discharge Date: 01/29/19               DME Arranged: Oxygen DME Agency: [employer] Date DME Agency Contacted: 01/29/19 Time DME Agency Contacted: 25 Representative spoke with at DME Agency: Darlin Drop            Prior Living Arrangements/Services Living arrangements for the past 2 months: Apartment Lives with:: Self Patient language and need for interpreter reviewed:: Yes Do you feel safe going back to the place where you live?: Yes      Need for Family Participation in Patient Care: Yes (Comment)(COVID- weakness) Care giver support system in place?: Yes  (comment)(daughter) Current home services: DME(wheelchair, cane, bedside commode) Criminal Activity/Legal Involvement Pertinent to Current Situation/Hospitalization: No - Comment as needed  Activities of Daily Living Home Assistive Devices/Equipment: Walker (specify type) ADL Screening (condition at time of admission) Patient's cognitive ability adequate to safely complete daily activities?: Yes Is the patient deaf or have difficulty hearing?: Yes Does the patient have difficulty seeing, even when wearing glasses/contacts?: No Does the patient have difficulty concentrating, remembering, or making decisions?: No Patient able to express need for assistance with ADLs?: Yes Does the patient have difficulty dressing or bathing?: No Independently performs ADLs?: Yes (appropriate for developmental age) Does the patient have difficulty walking or climbing stairs?: Yes Weakness of Legs: Both Weakness of Arms/Hands: None  Permission Sought/Granted Permission sought to share information with : Case Manager, Family Supports, Other (comment) Permission granted to share information with : Yes, Verbal Permission Granted     Permission granted to share info w AGENCY: [employer]  Permission granted to share info w Relationship: daughter     Emotional Assessment       Orientation: : Oriented to Self, Oriented to Place, Oriented to  Time, Oriented to Situation Alcohol / Substance Use: Not Applicable Psych Involvement: No (comment)  Admission diagnosis:  Shortness of breath [R06.02] Acute hypoxemic respiratory failure due to COVID-19 ([place]) [U07.1, J96.01] COVID-19 virus infection [U07.1] Patient Active Problem List   Diagnosis Date Noted  . Acute hypoxemic respiratory failure due  to COVID-19 ([place]) 01/28/2019  . Gastroesophageal reflux disease without esophagitis   . Insomnia 01/05/2019  . Constipation 06/02/2018  . Acute pain of left shoulder 05/23/2018  . Diarrhea 05/23/2018  . Pneumatosis coli  07/29/2017  . 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  . Memory disturbance 03/27/2017  . Recurrent urinary tract infection 10/17/2016  . Lumbar stenosis with neurogenic claudication 08/10/2016  . Anemia 07/06/2016  . Shortness of breath 03/31/2016  . Osteoarthritis of spine with radiculopathy, lumbar region 03/16/2016  . History of colitis 03/02/2016  . Primary osteoarthritis of left hip 03/02/2016  . CKD (chronic kidney disease) stage 3, GFR 30-59 ml/min (HCC) 02/21/2016  . DM (diabetes mellitus), type 2, uncontrolled, with renal complications ([place]) 18/48/5927  . Hyperlipidemia 01/19/2016  . Hypothyroidism 01/19/2016  . HTN (hypertension), malignant 01/19/2016  . Chronic pain 01/19/2016  . Esophageal dysmotility 12/21/2015   PCP:  Burnard Hawthorne, FNP Pharmacy:   [place], [place] [address] [place] [address] Phone: [phone] Fax: [phone]  [id number] [address]Phone: [phone]: [phone]    Social Determinants of Health (SDOH) Interventions    Readmission Risk Interventions No flowsheet data found.

## 2019-01-29 NOTE — Progress Notes (Signed)
SATURATION QUALIFICATIONS: (This note is used to comply with regulatory documentation for home oxygen)  Patient Saturations on Room Air at Rest = 86%  Patient Saturations on Room Air while Ambulating = 81%  Patient Saturations on 3 Liters of oxygen while Ambulating = 92%  Please briefly explain why patient needs home oxygen:Patient has a low Oxygen saturation while at rest on room air and further desaturates while ambulating on room air.

## 2019-01-29 NOTE — Discharge Summary (Signed)
[facility] [place] [facility]   PATIENT NAME: Amy Calhoun    MR#:  [id number]  DATE OF BIRTH:  [birth year]  DATE OF ADMISSION:  01/28/2019 ADMITTING PHYSICIAN: Enedina Finner, MD  DATE OF DISCHARGE: 01/29/2019  PRIMARY CARE PHYSICIAN: Allegra Grana, FNP    ADMISSION DIAGNOSIS:  Shortness of breath [R06.02] Acute hypoxemic respiratory failure due to COVID-19 (HCC) [U07.1, J96.01] COVID-19 virus infection [U07.1]  DISCHARGE DIAGNOSIS:  acute hypoxic respiratory failure due to COVID-19  SECONDARY DIAGNOSIS:   Past Medical History:  Diagnosis Date  . Chronic kidney disease 08/07/2016   Chronic renal failure, stage III  . Depression   . Esophageal dysphagia   . Gastric ulcer   . GERD (gastroesophageal reflux disease)   . Hyperlipidemia   . Hypertension   . Hypothyroidism   . IDDM (insulin dependent diabetes mellitus)   . Ischemic colitis (HCC)   . Osteoarthritis   . Ulcer of esophagus     HOSPITAL COURSE:  Amy Calhoun  is a 86 y.o. female with a known history of chronic kidney disease stage III, Gerd with history of peptic ulcer disease, hypertension, hyperlipidemia, type II diabetes on insulin comes to the emergency room brought in by patient's daughter with increasing shortness of breath.  1. Acute hypoxic respiratory failure secondary to COVID-19 -IV Solu-Medrol 60 mg -- patient advised to finish of the script that was sent from the emergency room for oral prednisone. - CRP-- surprisingly level still pending - Pro calcitonin is then 0.1 -zinc, vitamin C and famotidine -inhalers PRN -patient qualifies for home oxygen use. Care management notified. -chest x-ray not impressive shows streaky opacity within the left lung base.  2. Diabetes mellitus type II, hyperglycemia, insulin requiring -continue home dose insulin with Lantus and short-acting NovoLog along with sliding scale -sugars may go up with steroids  3. Chronic kidney disease  stage III, suspected due to long-standing diabetes and hypertension -creatinine today stable at 0 .9  4. Hypertension, uncontrolled -lisinopril -PRN hydralazine  5. Gerd/ History of gastric ulcer continue PPI  patient is hemodynamically stable. Discussed with daughter Amy Calhoun. Will set up oxygen and discharged home later today. Daughter and patient in agreement.    Family Communication : daughter Amy Calhoun Consults : none Code Status : full-- discussed with daughter who was [facility] POA DVT prophylaxis : Lovenox admission status: observation  CONSULTS OBTAINED:    DRUG ALLERGIES:   Allergies  Allergen Reactions  . Pravachol [Pravastatin] Other (See Comments)    States she refused due to side effects    DISCHARGE MEDICATIONS:   Allergies as of 01/29/2019      Reactions   Pravachol [pravastatin] Other (See Comments)   States she refused due to side effects      Medication List    TAKE these medications   acetaminophen-codeine 300-30 MG tablet Commonly known as: TYLENOL #3 TAKE 1 TABLET TWICE A DAY AS NEEDED FOR SEVERE PAIN What changed:   how much to take  how to take this  when to take this  reasons to take this  additional instructions   acidophilus Caps capsule Take 1 capsule by mouth daily.   albuterol 108 (90 Base) MCG/ACT inhaler Commonly known as: VENTOLIN HFA Inhale 2 puffs into the lungs every 6 (six) hours as needed for wheezing or shortness of breath.   amitriptyline 75 MG tablet Commonly known as: ELAVIL Take 2 tablets (150 mg total) by mouth at bedtime.   BD Insulin Syringe U/F  31G X 5/16" 1 ML Misc Generic drug: Insulin Syringe-Needle U-100 USE AS DIRECTED TO ADMINISTER INSULIN   Insulin Syringe-Needle U-100 31G X 5/16" 0.3 ML Misc Commonly known as: BD Insulin Syringe U/F Use four times a day.   BD Pen Needle Nano U/F 32G X 4 MM Misc Generic drug: Insulin Pen Needle 1 Device by Does not apply route daily.   Co Q 10 100 MG Caps Take 1  capsule by mouth daily.   CRANBERRY PO Take 1 tablet by mouth every Monday, Wednesday, and Friday.   docusate sodium 100 MG capsule Commonly known as: COLACE Take 1 capsule (100 mg total) by mouth 2 (two) times daily. What changed: when to take this   FreeStyle Libre 14 Day Sensor Misc 1 Package by Does not apply route every 14 (fourteen) days.   gabapentin 100 MG capsule Commonly known as: NEURONTIN TAKE 2 CAPSULES THREE TIMES A DAY What changed:   how much to take  how to take this  when to take this   glucagon 1 MG injection Inject 1 mg into the vein once as needed. For hypoglycemic episodes. E11.42   guaiFENesin-dextromethorphan 100-10 MG/5ML syrup Commonly known as: ROBITUSSIN DM Take 10 mLs by mouth every 4 (four) hours as needed for cough.   insulin regular 100 units/mL injection Commonly known as: HumuLIN R Inject 0.06 mLs (6 Units total) into the skin 3 (three) times daily before meals.   Lantus SoloStar 100 UNIT/ML Solostar Pen Generic drug: Insulin Glargine Inject 8 Units into the skin at bedtime.   levothyroxine 150 MCG tablet Commonly known as: SYNTHROID TAKE 1 TABLET DAILY What changed: when to take this   lisinopril 10 MG tablet Commonly known as: ZESTRIL TAKE 1 TABLET DAILY What changed: how much to take   omeprazole 40 MG capsule Commonly known as: PRILOSEC Take 1 capsule (40 mg total) by mouth 2 (two) times daily.   ondansetron 4 MG disintegrating tablet Commonly known as: Zofran ODT Take 1 tablet (4 mg total) by mouth every 8 (eight) hours as needed for nausea or vomiting.   predniSONE 20 MG tablet Commonly known as: Deltasone Take 2 tablets (40 mg total) by mouth daily for 4 days. Start the day after the ER visit   pyridOXINE 100 MG tablet Commonly known as: VITAMIN B-6 Take 100 mg by mouth daily.   riboflavin 100 MG Tabs tablet Commonly known as: VITAMIN B-2 Take 100 mg by mouth daily.   simvastatin 40 MG tablet Commonly known  as: ZOCOR TAKE 1 TABLET AT BEDTIME What changed:   when to take this  additional instructions   Turmeric 500 MG Tabs Take 750 mg by mouth 2 (two) times daily.   vitamin B-12 500 MCG tablet Commonly known as: CYANOCOBALAMIN Take 500 mcg by mouth daily.   Vitamin D 50 MCG (2000 UT) tablet Take 2,000 Units by mouth daily.            Durable Medical Equipment  (From admission, onward)         Start     Ordered   01/29/19 1217  DME Oxygen  Once    Question Answer Comment  Length of Need 6 Months   Mode or (Route) Nasal cannula   Liters per Minute 2   Frequency Continuous (stationary and portable oxygen unit needed)   Oxygen conserving device Yes   Oxygen delivery system Gas      01/29/19 1217  If you experience worsening of your admission symptoms, develop shortness of breath, life threatening emergency, suicidal or homicidal thoughts you must seek medical attention immediately by calling 911 or calling your MD immediately  if symptoms less severe.  You Must read complete instructions/literature along with all the possible adverse reactions/side effects for all the Medicines you take and that have been prescribed to you. Take any new Medicines after you have completely understood and accept all the possible adverse reactions/side effects.   Please note  You were cared for by a hospitalist during your hospital stay. If you have any questions about your discharge medications or the care you received while you were in the hospital after you are discharged, you can call the unit and asked to speak with the hospitalist on call if the hospitalist that took care of you is not available. Once you are discharged, your primary care physician will handle any further medical issues. Please note that NO REFILLS for any discharge medications will be authorized once you are discharged, as it is imperative that you return to your primary care physician (or establish a  relationship with a primary care physician if you do not have one) for your aftercare needs so that they can reassess your need for medications and monitor your lab values. Today   SUBJECTIVE  no new complaints. Sugars were little bit high this morning. Wants to go home.   VITAL SIGNS:  Blood pressure (!) 143/60, pulse 85, temperature 97.6 F (36.4 C), temperature source Oral, resp. rate (!) 24, height 5\' 4"  (1.626 m), weight 63.5 kg, SpO2 92 %.  I/O:  No intake or output data in the 24 hours ending 01/29/19 1219  PHYSICAL EXAMINATION:  GENERAL:  86 y.o.-year-old patient lying in the bed with no acute distress.  EYES: Pupils equal, round, reactive to light and accommodation. No scleral icterus.  HEENT: Head atraumatic, normocephalic. Oropharynx and nasopharynx clear. Hard on hearing. NECK:  Supple, no jugular venous distention. No thyroid enlargement, no tenderness.  LUNGS: Normal breath sounds bilaterally, scattered wheezing, no rales,rhonchi or crepitation. No use of accessory muscles of respiration.  CARDIOVASCULAR: S1, S2 normal. No murmurs, rubs, or gallops.  ABDOMEN: Soft, non-tender, non-distended. Bowel sounds present. No organomegaly or mass.  EXTREMITIES: No pedal edema, cyanosis, or clubbing.  NEUROLOGIC: Cranial nerves II through XII are intact. Muscle strength 5/5 in all extremities. Sensation intact. Gait not checked.  PSYCHIATRIC: The patient is alert and oriented x 3.  SKIN: No obvious rash, lesion, or ulcer.   DATA REVIEW:   CBC  Recent Labs  Lab 01/29/19 0654  WBC 2.2*  HGB 11.7*  HCT 35.5*  PLT 220    Chemistries  Recent Labs  Lab 01/28/19 1147 01/28/19 1147 01/29/19 0654  NA 135  --   --   K 3.9  --   --   CL 98  --   --   CO2 25  --   --   GLUCOSE 273*  --   --   BUN 22  --   --   CREATININE 0.98   < > 0.95  CALCIUM 7.9*  --   --    < > = values in this interval not displayed.    Microbiology Results   Recent Results (from the past 240  hour(s))  Novel Coronavirus, NAA ([facility])     Status: Abnormal   Collection Time: 01/22/19  1:50 PM   Specimen: Nasopharyngeal(NP) swabs in vial transport medium  NASOPHARYNGE  TESTING  Result Value Ref Range Status   SARS-CoV-2, NAA Detected (A) Not Detected Final    Comment: This nucleic acid amplification test was developed and its performance characteristics determined by [employer]. Nucleic acid amplification tests include PCR and TMA. This test has not been FDA cleared or approved. This test has been authorized by FDA under an Emergency Use Authorization (EUA). This test is only authorized for the duration of time the declaration that circumstances exist justifying the authorization of the emergency use of in vitro diagnostic tests for detection of SARS-CoV-2 virus and/or diagnosis of COVID-19 infection under section 564(b)(1) of the Act, 21 U.S.C. 474QVZ-5(G) (1), unless the authorization is terminated or revoked sooner. When diagnostic testing is negative, the possibility of a false negative result should be considered in the context of a patient's recent exposures and the presence of clinical signs and symptoms consistent with COVID-19. An individual without symptoms of COVID-19 and who is not shedding SARS-CoV-2 virus would  expect to have a negative (not detected) result in this assay.     RADIOLOGY:  DG Chest 2 View  Result Date: 01/28/2019 CLINICAL DATA:  Shortness of breath, COVID-19 positive EXAM: CHEST - 2 VIEW COMPARISON:  07/12/2016 FINDINGS: The heart size and mediastinal contours are within normal limits. Calcific aortic knob. Mild streaky and interstitial opacity within the left lung base. No pleural effusion. No pneumothorax. Chronic, healed left-sided rib fractures. No acute osseous findings. IMPRESSION: Streaky opacity within the left lung base. While this may represent atelectasis or scarring, a developing infiltrate would be difficult exclude.  Electronically Signed   By: Duanne Guess D.O.   On: 01/28/2019 12:30     CODE STATUS:     Code Status Orders  (From admission, onward)         Start     Ordered   01/28/19 1922  Full code  Continuous     01/28/19 1921        Code Status History    Date Active Date Inactive Code Status Order ID Comments User Context   08/10/2016 1358 08/11/2016 1754 Full Code [id number]  Coletta Memos, MD Inpatient   07/12/2016 1608 07/13/2016 1359 DNR [id number]  Altamese Dilling, MD Inpatient   07/12/2016 1523 07/12/2016 1608 Full Code [id number]  Altamese Dilling, MD ED   03/28/2016 2356 03/31/2016 1619 Full Code [id number]  Hugelmeyer, Jon Gills, DO Inpatient   Advance Care Planning Activity    Advance Directive Documentation     Most Recent Value  Type of Advance Directive  Healthcare Power of Attorney, Living will  Pre-existing out of facility DNR order (yellow form or pink MOST form)  --  "MOST" Form in Place?  --       TOTAL TIME TAKING CARE OF THIS PATIENT: 35** minutes.    Enedina Finner M.D on 01/29/2019 at 12:19 PM  Between 7am to 6pm - Pager - [phone] After 6pm go to [email or web address] - password [email or web address]  [facility]    CC: Primary care physician; Allegra Grana, FNP

## 2019-01-29 NOTE — Progress Notes (Signed)
Patient discharged home with home oxygen. Daughter Starla Link) transported patient via POV. Discharge paperwork reviewed with patient and patient's daughter. Usage of home oxygen equipment explained to patient and patient's daughter. Patient and patient's daughter had no questions and verbalized understanding.

## 2019-01-29 NOTE — Telephone Encounter (Signed)
Will follow.

## 2019-01-29 NOTE — Telephone Encounter (Signed)
Pt is being discharged today from [facility] for COVID They stated that she needs to be seen within one week No available appts to schedule with PCP

## 2019-01-29 NOTE — Progress Notes (Signed)
Daughter Starla Link) updated regarding patient's status. Daughter verbalized desire to have patient home. This RN informed daughter that there are no discharge orders for the patient at this time. This RN reviewed AM CBG with daughter. This RN also reviewed Oxygen delivery amount and patient oxygen saturation with daughter. This RN informed daughter that daughter would receive a phone call if there is a change in patient status. Daughter verbalized understanding and had no further questions.

## 2019-01-30 ENCOUNTER — Telehealth: Payer: Self-pay | Admitting: Family

## 2019-01-30 ENCOUNTER — Other Ambulatory Visit: Payer: Self-pay

## 2019-01-30 ENCOUNTER — Telehealth: Payer: Self-pay

## 2019-01-30 ENCOUNTER — Other Ambulatory Visit: Payer: Self-pay | Admitting: Family

## 2019-01-30 ENCOUNTER — Ambulatory Visit (INDEPENDENT_AMBULATORY_CARE_PROVIDER_SITE_OTHER): Payer: Medicare HMO | Admitting: Internal Medicine

## 2019-01-30 DIAGNOSIS — E1165 Type 2 diabetes mellitus with hyperglycemia: Secondary | ICD-10-CM | POA: Diagnosis not present

## 2019-01-30 DIAGNOSIS — Z794 Long term (current) use of insulin: Secondary | ICD-10-CM

## 2019-01-30 DIAGNOSIS — U071 COVID-19: Secondary | ICD-10-CM

## 2019-01-30 DIAGNOSIS — J029 Acute pharyngitis, unspecified: Secondary | ICD-10-CM

## 2019-01-30 MED ORDER — AZITHROMYCIN 250 MG PO TABS: ORAL_TABLET | ORAL | 0 refills | Status: DC

## 2019-01-30 NOTE — Telephone Encounter (Signed)
Please schedule for virtual this afternoon

## 2019-01-30 NOTE — Telephone Encounter (Signed)
I spoke with [name] & she said that her mom was sent home on O2 2 liters & she is at 92% right now. No fever that she was aware of but she was coughing. She has had this cough exceeding the time she was Covid positive on 1/6. She would like antibiotic sent in but requested a zpak instead. I cannot see though where amoxicillin was pended. I have ordered Covid monitoring for patient & she is scheduled Monday for f/u. They have also contacted endo about high sugars since she has been on prednisone.

## 2019-01-30 NOTE — Telephone Encounter (Signed)
Yes please sch her for Monday Get more info on sore throat.   Fever, sob, cough?  If bacterial, I would recommend amoxicillin and I have pended if [facility] and Patient agree. I also pended covid home monitoring if they agree  Of course salt water gargles too AND probiotics very important.

## 2019-01-30 NOTE — Telephone Encounter (Signed)
Patients daughter called in stating that patient was released from the hospital yesterday. And they discharged her still taking Prednisone. And her blood sugar levels are very elevated. She is needing some help with getting her back in the right track and what dosages she needs to give.  Patients daughter is asking for a VV today if possible.    Please call and advise

## 2019-01-30 NOTE — Progress Notes (Signed)
Virtual Visit via Video Note  I connected with Amy Calhoun on 01/30/19 at 3:00 pm by a video enabled telemedicine application and verified that I am speaking with the correct person using two identifiers.   I discussed the limitations of evaluation and management by telemedicine and the availability of in person appointments. The patient expressed understanding and agreed to proceed.  -Location of the patient : Home  -Location of the provider : office -The names of all persons participating in the telemedicine service : Pt and myself and daughter Amy Calhoun            Name: Amy Calhoun  Age/ Sex: 86 y.o., female   MRN/ DOB: [id number], 07/15/1928     PCP: Allegra Grana, FNP   Reason for Endocrinology Evaluation: Type 2 Diabetes Mellitus  Initial Endocrine Consultative Visit: 04/15/2018    PATIENT IDENTIFIER: Amy Calhoun is a 86 y.o. female with a past medical history of T2DM, Dyslipidemia, Hypothyroidism and HTN . The patient has followed with Endocrinology clinic since 04/15/2018 for consultative assistance with management of her diabetes.   Patient used to be cared for by her other daughter in [place] but daughter had passed due to a motorcycle accident and Amy Calhoun brought mother to [facility] in 2017.   DIABETIC HISTORY:  [name] was diagnosed with DM at age 82. She has been on insulin since her diagnosis.She was briefly on glipizide but this was stopped in March, 2020. Pt did not recall any other oral glycemic agents. Her hemoglobin A1c has ranged from 6.5 % in 2018, peaking at 8.9%  In 2018.  Daughter would like to explore putting mother on glycemic agents, she is concerned about having MDI regimen as mother gets older and worried about future dexterity.   On her initial visit to our clinic, she was on Humulin-N BID, Humulin-R per SS and Tradjenta.  We stopped Tradjenta, started Trulicity in March, 2020, with reduction in insulin doses.   SUBJECTIVE:   During  the last visit (01/01/2019): A1c was 7.4%. We decreased Lantus to 8 units and continued Novolin- R 6 units TIDQAC   Today (01/30/2019): [name] is here with daughter Amy Calhoun for a follow up on persistent hyperglycemia since her discharge from the hospital for COVID. She is currently on prednisone 40 mg daily  and her BG's have been in 400's.   Pt continues to check glucose through freestyle libre multiple times a day  She is sob but this is getting better.  Fever has resolved    ROS: As per HPI and as detailed below: Review of Systems  Constitutional: Negative for chills and fever.  Respiratory: Negative for cough and shortness of breath.   Cardiovascular: Negative for chest pain and palpitations.  Gastrointestinal: Negative for diarrhea and nausea.      HOME DIABETES REGIMEN:  Lantus 8 units daily  Humulin-R 6 units      GLUCOSE LOG : > 400 mg/dL    HISTORY:  Past Medical History:  Past Medical History:  Diagnosis Date  . Chronic kidney disease 08/07/2016   Chronic renal failure, stage III  . Depression   . Esophageal dysphagia   . Gastric ulcer   . GERD (gastroesophageal reflux disease)   . Hyperlipidemia   . Hypertension   . Hypothyroidism   . IDDM (insulin dependent diabetes mellitus)   . Ischemic colitis (HCC)   . Osteoarthritis   . Ulcer of esophagus    Past Surgical History:  Past Surgical History:  Procedure Laterality Date  . ABDOMINAL HYSTERECTOMY    . COLONOSCOPY  2010  . ESOPHAGEAL DILATION  08/07/2016   usually a couple times a year, last time 07/30/16  . GALLBLADDER SURGERY    . LUMBAR LAMINECTOMY/DECOMPRESSION MICRODISCECTOMY N/A 08/10/2016   Procedure: BILATERAL HEMILAMINECTOMY LUMBAR THREE-FOUR,LUMBAR FOUR-FIVE,LEFT LUMBAR FIVE-SACRAL ONE HEMILAMINECTOMY AND DECOMPRESSION;  Surgeon: Coletta Memos, MD;  Location: [facility] OR;  Service: Neurosurgery;  Laterality: N/A;  . THYROIDECTOMY      Social History:  reports that she has never smoked. She has  never used smokeless tobacco. She reports that she does not drink alcohol or use drugs. Family History:  Family History  Problem Relation Age of Onset  . Diabetes Mother   . Arthritis Mother   . Hyperlipidemia Mother   . Mental illness Mother   . Heart disease Father   . Mental illness Sister   . Arthritis Maternal Grandmother   . Arthritis Maternal Grandfather   . Colon cancer Neg Hx   . Stomach cancer Neg Hx   . Esophageal cancer Neg Hx      HOME MEDICATIONS: Allergies as of 01/30/2019      Reactions   Pravachol [pravastatin] Other (See Comments)   States she refused due to side effects      Medication List       Accurate as of January 30, 2019 12:08 PM. If you have any questions, ask your nurse or doctor.        acetaminophen-codeine 300-30 MG tablet Commonly known as: TYLENOL #3 TAKE 1 TABLET TWICE A DAY AS NEEDED FOR SEVERE PAIN What changed:   how much to take  how to take this  when to take this  reasons to take this  additional instructions   acidophilus Caps capsule Take 1 capsule by mouth daily.   albuterol 108 (90 Base) MCG/ACT inhaler Commonly known as: VENTOLIN HFA Inhale 2 puffs into the lungs every 6 (six) hours as needed for wheezing or shortness of breath.   amitriptyline 75 MG tablet Commonly known as: ELAVIL Take 2 tablets (150 mg total) by mouth at bedtime.   azithromycin 250 MG tablet Commonly known as: ZITHROMAX Tale 500 mg PO on day 1, then 250 mg PO q24h x 4 days. Started by: Rennie Plowman, FNP   BD Insulin Syringe U/F 31G X 5/16" 1 ML Misc Generic drug: Insulin Syringe-Needle U-100 USE AS DIRECTED TO ADMINISTER INSULIN   Insulin Syringe-Needle U-100 31G X 5/16" 0.3 ML Misc Commonly known as: BD Insulin Syringe U/F Use four times a day.   BD Pen Needle Nano U/F 32G X 4 MM Misc Generic drug: Insulin Pen Needle 1 Device by Does not apply route daily.   Co Q 10 100 MG Caps Take 1 capsule by mouth daily.   CRANBERRY PO  Take 1 tablet by mouth every Monday, Wednesday, and Friday.   docusate sodium 100 MG capsule Commonly known as: COLACE Take 1 capsule (100 mg total) by mouth 2 (two) times daily. What changed: when to take this   FreeStyle Libre 14 Day Sensor Misc 1 Package by Does not apply route every 14 (fourteen) days.   gabapentin 100 MG capsule Commonly known as: NEURONTIN TAKE 2 CAPSULES THREE TIMES A DAY What changed:   how much to take  how to take this  when to take this   glucagon 1 MG injection Inject 1 mg into the vein once as needed. For hypoglycemic episodes. E11.42  guaiFENesin-dextromethorphan 100-10 MG/5ML syrup Commonly known as: ROBITUSSIN DM Take 10 mLs by mouth every 4 (four) hours as needed for cough.   insulin regular 100 units/mL injection Commonly known as: HumuLIN R Inject 0.06 mLs (6 Units total) into the skin 3 (three) times daily before meals.   Lantus SoloStar 100 UNIT/ML Solostar Pen Generic drug: Insulin Glargine Inject 8 Units into the skin at bedtime.   levothyroxine 150 MCG tablet Commonly known as: SYNTHROID TAKE 1 TABLET DAILY What changed: when to take this   lisinopril 10 MG tablet Commonly known as: ZESTRIL TAKE 1 TABLET DAILY What changed: how much to take   omeprazole 40 MG capsule Commonly known as: PRILOSEC Take 1 capsule (40 mg total) by mouth 2 (two) times daily.   ondansetron 4 MG disintegrating tablet Commonly known as: Zofran ODT Take 1 tablet (4 mg total) by mouth every 8 (eight) hours as needed for nausea or vomiting.   predniSONE 20 MG tablet Commonly known as: Deltasone Take 2 tablets (40 mg total) by mouth daily for 4 days. Start the day after the ER visit   pyridOXINE 100 MG tablet Commonly known as: VITAMIN B-6 Take 100 mg by mouth daily.   riboflavin 100 MG Tabs tablet Commonly known as: VITAMIN B-2 Take 100 mg by mouth daily.   simvastatin 40 MG tablet Commonly known as: ZOCOR TAKE 1 TABLET AT BEDTIME  What changed:   when to take this  additional instructions   Turmeric 500 MG Tabs Take 750 mg by mouth 2 (two) times daily.   vitamin B-12 500 MCG tablet Commonly known as: CYANOCOBALAMIN Take 500 mcg by mouth daily.   Vitamin D 50 MCG (2000 UT) tablet Take 2,000 Units by mouth daily.        DATA REVIEWED:  Lab Results  Component Value Date   HGBA1C 7.4 (A) 01/01/2019   HGBA1C 9.0 (A) 10/07/2018   HGBA1C 7.9 (H) 04/04/2018   Lab Results  Component Value Date   MICROALBUR 0.8 01/02/2017   LDLCALC 71 04/04/2018   CREATININE 0.95 01/29/2019   Lab Results  Component Value Date   CHOL 146 04/04/2018   HDL 56.20 04/04/2018   LDLCALC 71 04/04/2018   TRIG 93.0 04/04/2018   CHOLHDL 3 04/04/2018         ASSESSMENT / PLAN / RECOMMENDATIONS:   1) Type 2 Diabetes Mellitus,Optimally controlled, With CKD III and neuropathic complications - Most recent A1c of 7.4 %. Goal A1c < 7.5 %.    - Recent BG's have been out of control due to recent admission for COVID and the need for glucocorticoids. She was discharged last night and her BG's were "Hi " on the meter, daughter Amy Calhoun administered 20 units of regular insulin and 20 units of lantus and by this morning her BG was in 400's.  She is staying on 40 mg prednisone until Monday  MEDICATIONS:   Increase Lantus to 10 units until Sunday night, then reduce back to 8 units   Increase Novolin-R to 12 units before each meal until Monday night   CF : Regular insulin (BG-150/30) to be used during prednisone use only   EDUCATION / INSTRUCTIONS:  BG monitoring instructions: Patient is instructed to check her blood sugars 4 times a day, before meals and bedtime.  Call [facility] if: BG persistently < 70 or > 300. . I reviewed the Rule of 15 for the treatment of hypoglycemia in detail with the patient. Literature supplied.  Signed electronically by: Mack Guise, MD  [facility]   [facility] [address] [place], [place] [address] Phone: [phone] FAX: [phone]   CC: Burnard Hawthorne, FNP [address] [place] [address] Phone: [phone]  Fax: [phone]  Return to Endocrinology clinic as below: Future Appointments  Date Time Provider [place]  01/30/2019  3:00 PM Jessah Danser, Melanie Crazier, MD [facility] None  02/04/2019 11:30 AM Burnard Hawthorne, FNP [facility]  02/13/2019 10:30 AM Vidal Schwalbe Yvetta Coder, FNP [facility]

## 2019-01-30 NOTE — Telephone Encounter (Signed)
Emma notified that zpak sent.

## 2019-01-30 NOTE — Telephone Encounter (Signed)
Transition Care Management Follow-up Telephone Call  Date of discharge and from where: 01/29/19 from [facility]  How have you been since you were released from the hospital? Home with Oxygen 2L, 96%. Cough. Sore throat. Resting with antibiotics. Blood sugars being controlled with sliding scale provided by Endocrinology. Eating/drinking without issues. Denies nausea, vomiting, diarrhea, fever. Covid monitoring ordered via CMA.  Any questions or concerns? None at this time.   Items Reviewed:  Did the pt receive and understand the discharge instructions provided? Yes  Medications obtained and verified? Yes, daughter manages. No issues.   Any new allergies since your discharge? No.  Dietary orders reviewed? Yes, low sodium, heart healthy, low carb  Do you have support at home? Yes, daughter [name] is staying with patient.   Functional Questionnaire: (I = Independent and D = Dependent) ADLs: I  Bathing/Dressing- I -with minimal assistance at this time.   Meal Prep- I- with minimal assistance at this time.  Eating- I  Maintaining continence- I  Transferring/Ambulation- Cane/walker as needed.   Managing Meds- Daughter currently manages.   Follow up appointments reviewed:   PCP Hospital f/u appt confirmed? Yes, Scheduled to see Rennie Plowman on 02/04/19 @ 1130.  Specialist Hospital f/u appt confirmed? No  Are transportation arrangements needed? No  If their condition worsens, is the pt aware to call PCP or go to the Emergency Dept.? Yes  Was the patient provided with contact information for the PCP's office or ED? Yes  Was to pt encouraged to call back with questions or concerns? Yes

## 2019-01-30 NOTE — Telephone Encounter (Signed)
Pt's daughter called to ask if pt could be put on antibiotics for a sore throat following hospital stay for Covid. Daughter states that hospital recommended that she talks to PCP about treatment. Please advise.

## 2019-01-30 NOTE — Telephone Encounter (Signed)
Ok to schedule virtual if slot available?

## 2019-01-30 NOTE — Telephone Encounter (Signed)
[facility]pak to [facility] We can talk on Monday

## 2019-01-31 ENCOUNTER — Other Ambulatory Visit: Payer: Self-pay | Admitting: Family

## 2019-02-02 NOTE — Progress Notes (Signed)
Virtual Visit via Video Note  I connected with@  on 02/04/19 at 11:30 AM EST by a video enabled telemedicine application and verified that I am speaking with the correct person using two identifiers.  Location patient: home Location provider:work  Persons participating in the virtual visit: patient, provider  I discussed the limitations of evaluation and management by telemedicine and the availability of in person appointments. The patient expressed understanding and agreed to proceed.   HPI: Hospital follow-up. TCM call 01/30/19. Accompanied by daughter, [name].  Reports that her knees gave out yesterday and fell against the microwave last night. No laceration, bruising, HA, dizziness, confusion. No LOC. Not on blood thinner.   Breathing well, Sa02 94-96%. Has o2 prn and wearing sometimes at night. Advised 2L. Using spirometer. SOB with activity. Coughing more at night, and has been taking mucinex Dm, with improvement. Cough improving and sore throat and feels zpak has helped, completed yesterday. Drinking and eating well. Using robitussin, inhaler with relief.   Follows with Dr Ames Coupe for DM, increased insulin only while on prednisone. Sugar has improved, and FBG < 225 now.   Admitted for shortness of breath and acute hypoxemic respiratory failure due to COVID-19 on 01/28/2019 and discharged a day later.  She was given IV Solu-Medrol and oral prednisone.  Given zinc vitamin C and ranitidine, inhalers as needed.  She qualified for home oxygen use CXR showed streaky opacity in left lung bases Crt 0.9  HTN-no longer on lisinopril; not on hydralazine. At home 158/69, HR 75.  ROS: See pertinent positives and negatives per HPI.  Past Medical History:  Diagnosis Date  . Chronic kidney disease 08/07/2016   Chronic renal failure, stage III  . Depression   . Esophageal dysphagia   . Gastric ulcer   . GERD (gastroesophageal reflux disease)   . Hyperlipidemia   . Hypertension   .  Hypothyroidism   . IDDM (insulin dependent diabetes mellitus)   . Ischemic colitis ([place])   . Osteoarthritis   . Ulcer of esophagus     Past Surgical History:  Procedure Laterality Date  . ABDOMINAL HYSTERECTOMY    . COLONOSCOPY  2010  . ESOPHAGEAL DILATION  08/07/2016   usually a couple times a year, last time 07/30/16  . GALLBLADDER SURGERY    . LUMBAR LAMINECTOMY/DECOMPRESSION MICRODISCECTOMY N/A 08/10/2016   Procedure: BILATERAL HEMILAMINECTOMY LUMBAR THREE-FOUR,LUMBAR FOUR-FIVE,LEFT LUMBAR FIVE-SACRAL ONE HEMILAMINECTOMY AND DECOMPRESSION;  Surgeon: Ashok Pall, MD;  Location: [facility];  Service: Neurosurgery;  Laterality: N/A;  . THYROIDECTOMY      Family History  Problem Relation Age of Onset  . Diabetes Mother   . Arthritis Mother   . Hyperlipidemia Mother   . Mental illness Mother   . Heart disease Father   . Mental illness Sister   . Arthritis Maternal Grandmother   . Arthritis Maternal Grandfather   . Colon cancer Neg Hx   . Stomach cancer Neg Hx   . Esophageal cancer Neg Hx     SOCIAL HX: never smoker   Current Outpatient Medications:  .  acetaminophen-codeine (TYLENOL #3) 300-30 MG tablet, TAKE 1 TABLET TWICE A DAY AS NEEDED FOR SEVERE PAIN (Patient taking differently: Take 1 tablet by mouth 2 (two) times daily as needed for severe pain. ), Disp: 20 tablet, Rfl: 0 .  acidophilus (RISAQUAD) CAPS capsule, Take 1 capsule by mouth daily., Disp: , Rfl:  .  albuterol (PROVENTIL HFA;VENTOLIN HFA) 108 (90 Base) MCG/ACT inhaler, Inhale 2 puffs into the lungs every  6 (six) hours as needed for wheezing or shortness of breath., Disp: 1 Inhaler, Rfl: 2 .  amitriptyline (ELAVIL) 75 MG tablet, Take 2 tablets (150 mg total) by mouth at bedtime., Disp: 180 tablet, Rfl: 4 .  BD INSULIN SYRINGE U/F 31G X 5/16" 1 ML MISC, USE AS DIRECTED TO ADMINISTER INSULIN, Disp: 100 each, Rfl: 5 .  Cholecalciferol (VITAMIN D) 2000 units tablet, Take 2,000 Units by mouth daily. , Disp: , Rfl:  .   Coenzyme Q10 (CO Q 10) 100 MG CAPS, Take 1 capsule by mouth daily., Disp: , Rfl:  .  Continuous Blood Gluc Sensor (FREESTYLE LIBRE 14 DAY SENSOR) MISC, 1 Package by Does not apply route every 14 (fourteen) days., Disp: 1 each, Rfl: 0 .  CRANBERRY PO, Take 1 tablet by mouth every Monday, Wednesday, and Friday., Disp: , Rfl:  .  docusate sodium (COLACE) 100 MG capsule, Take 1 capsule (100 mg total) by mouth 2 (two) times daily. (Patient taking differently: Take 100 mg by mouth every other day. ), Disp: 10 capsule, Rfl: 0 .  gabapentin (NEURONTIN) 100 MG capsule, TAKE 2 CAPSULES THREE TIMES A DAY (Patient taking differently: Take 200 mg by mouth 3 (three) times daily. TAKE 2 CAPSULES THREE TIMES A DAY), Disp: 540 capsule, Rfl: 1 .  glucagon (GLUCAGON EMERGENCY) 1 MG injection, Inject 1 mg into the vein once as needed. For hypoglycemic episodes. E11.42, Disp: 1 each, Rfl: 3 .  Insulin Glargine (LANTUS SOLOSTAR) 100 UNIT/ML Solostar Pen, Inject 8 Units into the skin at bedtime., Disp: 15 mL, Rfl: 11 .  Insulin Pen Needle (BD PEN NEEDLE NANO U/F) 32G X 4 MM MISC, 1 Device by Does not apply route daily., Disp: 150 each, Rfl: 3 .  insulin regular (HUMULIN R) 100 units/mL injection, Inject 0.06 mLs (6 Units total) into the skin 3 (three) times daily before meals., Disp: 30 mL, Rfl: 3 .  Insulin Syringe-Needle U-100 (BD INSULIN SYRINGE U/F) 31G X 5/16" 0.3 ML MISC, Use four times a day., Disp: 4 each, Rfl: 0 .  levothyroxine (SYNTHROID) 150 MCG tablet, TAKE 1 TABLET DAILY, Disp: 90 tablet, Rfl: 3 .  lisinopril (PRINIVIL,ZESTRIL) 10 MG tablet, TAKE 1 TABLET DAILY (Patient taking differently: Take 5 mg by mouth daily. ), Disp: 90 tablet, Rfl: 3 .  omeprazole (PRILOSEC) 40 MG capsule, Take 1 capsule (40 mg total) by mouth 2 (two) times daily., Disp: 180 capsule, Rfl: 3 .  ondansetron (ZOFRAN ODT) 4 MG disintegrating tablet, Take 1 tablet (4 mg total) by mouth every 8 (eight) hours as needed for nausea or vomiting.,  Disp: 30 tablet, Rfl: 0 .  pyridOXINE (VITAMIN B-6) 100 MG tablet, Take 100 mg by mouth daily., Disp: , Rfl:  .  riboflavin (VITAMIN B-2) 100 MG TABS tablet, Take 100 mg by mouth daily., Disp: , Rfl:  .  simvastatin (ZOCOR) 40 MG tablet, TAKE 1 TABLET AT BEDTIME (Patient taking differently: Take 40 mg by mouth 3 (three) times a week. (at bedtime)), Disp: 90 tablet, Rfl: 3 .  Turmeric 500 MG TABS, Take 750 mg by mouth 2 (two) times daily., Disp: , Rfl:  .  vitamin B-12 (CYANOCOBALAMIN) 500 MCG tablet, Take 500 mcg by mouth daily., Disp: , Rfl:   EXAM:  VITALS per patient if applicable:  GENERAL: alert, oriented, appears well and in no acute distress  HEENT: atraumatic, conjunttiva clear, no obvious abnormalities on inspection of external nose and ears  NECK: normal movements of the head and  neck  LUNGS: on inspection no signs of respiratory distress, breathing rate appears normal, no obvious gross SOB, gasping or wheezing  CV: no obvious cyanosis  MS: moves all visible extremities without noticeable abnormality  PSYCH/NEURO: pleasant and cooperative, no obvious depression or anxiety, speech and thought processing grossly intact  ASSESSMENT AND PLAN:  Discussed the following assessment and plan:  Acute hypoxemic respiratory failure due to COVID-19 [facility]) - Plan: DG Chest 2 View, Basic metabolic panel, CBC with Differential/Platelet  HTN (hypertension), malignant - Plan: Basic metabolic panel  Injury of head, initial encounter  DM (diabetes mellitus), type 2, uncontrolled, with renal complications (HCC) Problem List Items Addressed This Visit      Cardiovascular and Mediastinum   HTN (hypertension), malignant    Slightly elevated.  Based on home readings, advised patient to resume lisinopril and monitor blood pressure.  She will have a BMP checked in 1 week.      Relevant Orders   Basic metabolic panel     Respiratory   Acute hypoxemic respiratory failure due to COVID-19  [facility]) - Primary    Improved.  Reviewed hospital course with patient today.  No acute respiratory distress and paper was well-appearing over video today.  emphasized role ofspirometer, more frequent inhaler use to aid with lingering cough. Advised not to use o2 if Sa02 <90%.  Encouraged increased activity and walking .  Advised to repeat labs and chest x-ray      Relevant Orders   DG Chest 2 View   Basic metabolic panel   CBC with Differential/Platelet     Endocrine   DM (diabetes mellitus), type 2, uncontrolled, with renal complications (HCC)    Improved now that she is off oral prednisone.  She is following with endocrinology for this. Will follow        Other   Head injury    Head injury yesterday, no loss of consciousness.  No headache, dizziness today.  Advised CT head and patient and daughter have declined.  She is not on any anticoagulation and asymptomatic, advised very close vigilance and certainly to let me know if this were to change         -we discussed possible serious and likely etiologies, options for evaluation and workup, limitations of telemedicine visit vs in person visit, treatment, treatment risks and precautions. Pt prefers to treat via telemedicine empirically rather then risking or undertaking an in person visit at this moment. Patient agrees to seek prompt in person care if worsening, new symptoms arise, or if is not improving with treatment.   I discussed the assessment and treatment plan with the patient. The patient was provided an opportunity to ask questions and all were answered. The patient agreed with the plan and demonstrated an understanding of the instructions.   The patient was advised to call back or seek an in-person evaluation if the symptoms worsen or if the condition fails to improve as anticipated.   Rennie Plowman, FNP

## 2019-02-04 ENCOUNTER — Other Ambulatory Visit: Payer: Self-pay

## 2019-02-04 ENCOUNTER — Encounter: Payer: Self-pay | Admitting: Family

## 2019-02-04 ENCOUNTER — Ambulatory Visit (INDEPENDENT_AMBULATORY_CARE_PROVIDER_SITE_OTHER): Payer: Medicare HMO | Admitting: Family

## 2019-02-04 VITALS — HR 96 | Ht 64.0 in | Wt 144.0 lb

## 2019-02-04 DIAGNOSIS — IMO0002 Reserved for concepts with insufficient information to code with codable children: Secondary | ICD-10-CM

## 2019-02-04 DIAGNOSIS — S0990XA Unspecified injury of head, initial encounter: Secondary | ICD-10-CM | POA: Insufficient documentation

## 2019-02-04 DIAGNOSIS — U071 COVID-19: Secondary | ICD-10-CM | POA: Diagnosis not present

## 2019-02-04 DIAGNOSIS — J9601 Acute respiratory failure with hypoxia: Secondary | ICD-10-CM

## 2019-02-04 DIAGNOSIS — I10 Essential (primary) hypertension: Secondary | ICD-10-CM | POA: Diagnosis not present

## 2019-02-04 DIAGNOSIS — E1129 Type 2 diabetes mellitus with other diabetic kidney complication: Secondary | ICD-10-CM

## 2019-02-04 DIAGNOSIS — E1165 Type 2 diabetes mellitus with hyperglycemia: Secondary | ICD-10-CM

## 2019-02-04 NOTE — Assessment & Plan Note (Deleted)
Patient will keep BP,HR log.

## 2019-02-04 NOTE — Assessment & Plan Note (Signed)
Improved now that she is off oral prednisone.  She is following with endocrinology for this. Will follow

## 2019-02-04 NOTE — Assessment & Plan Note (Signed)
Head injury yesterday, no loss of consciousness.  No headache, dizziness today.  Advised CT head and patient and daughter have declined.  She is not on any anticoagulation and asymptomatic, advised very close vigilance and certainly to let me know if this were to change

## 2019-02-04 NOTE — Assessment & Plan Note (Signed)
Slightly elevated.  Based on home readings, advised patient to resume lisinopril and monitor blood pressure.  She will have a BMP checked in 1 week.

## 2019-02-04 NOTE — Assessment & Plan Note (Signed)
Improved.  Reviewed hospital course with patient today.  No acute respiratory distress and paper was well-appearing over video today.  emphasized role ofspirometer, more frequent inhaler use to aid with lingering cough. Advised not to use o2 if Sa02 <90%.  Encouraged increased activity and walking .  Advised to repeat labs and chest x-ray

## 2019-02-09 ENCOUNTER — Ambulatory Visit: Payer: Medicare HMO | Admitting: Family

## 2019-02-11 ENCOUNTER — Other Ambulatory Visit
Admission: RE | Admit: 2019-02-11 | Discharge: 2019-02-11 | Disposition: A | Discharge status: Home / Self Care | Payer: Medicare HMO | Source: Ambulatory Visit | Attending: Family | Admitting: Family

## 2019-02-11 DIAGNOSIS — J9601 Acute respiratory failure with hypoxia: Secondary | ICD-10-CM | POA: Insufficient documentation

## 2019-02-11 DIAGNOSIS — U071 COVID-19: Secondary | ICD-10-CM | POA: Diagnosis present

## 2019-02-11 DIAGNOSIS — I10 Essential (primary) hypertension: Secondary | ICD-10-CM | POA: Insufficient documentation

## 2019-02-11 LAB — CBC WITH DIFFERENTIAL/PLATELET
Abs Immature Granulocytes: 0.04 10*3/uL (ref 0.00–0.07)
Basophils Absolute: 0.1 10*3/uL (ref 0.0–0.1)
Basophils Relative: 1 %
Eosinophils Absolute: 0.1 10*3/uL (ref 0.0–0.5)
Eosinophils Relative: 1 %
HCT: 34.9 % — ABNORMAL LOW (ref 36.0–46.0)
Hemoglobin: 11.1 g/dL — ABNORMAL LOW (ref 12.0–15.0)
Immature Granulocytes: 1 %
Lymphocytes Relative: 16 %
Lymphs Abs: 1.4 10*3/uL (ref 0.7–4.0)
MCH: 28.6 pg (ref 26.0–34.0)
MCHC: 31.8 g/dL (ref 30.0–36.0)
MCV: 89.9 fL (ref 80.0–100.0)
Monocytes Absolute: 0.8 10*3/uL (ref 0.1–1.0)
Monocytes Relative: 9 %
Neutro Abs: 6.3 10*3/uL (ref 1.7–7.7)
Neutrophils Relative %: 72 %
Platelets: 291 10*3/uL (ref 150–400)
RBC: 3.88 MIL/uL (ref 3.87–5.11)
RDW: 12.9 % (ref 11.5–15.5)
WBC: 8.7 10*3/uL (ref 4.0–10.5)
nRBC: 0 % (ref 0.0–0.2)

## 2019-02-11 LAB — BASIC METABOLIC PANEL
Anion gap: 10 (ref 5–15)
BUN: 17 mg/dL (ref 8–23)
CO2: 28 mmol/L (ref 22–32)
Calcium: 8.9 mg/dL (ref 8.9–10.3)
Chloride: 96 mmol/L — ABNORMAL LOW (ref 98–111)
Creatinine, Ser: 1.03 mg/dL — ABNORMAL HIGH (ref 0.44–1.00)
GFR calc Af Amer: 56 mL/min — ABNORMAL LOW (ref >60)
GFR calc non Af Amer: 48 mL/min — ABNORMAL LOW (ref >60)
Glucose, Bld: 348 mg/dL — ABNORMAL HIGH (ref 70–99)
Potassium: 4.6 mmol/L (ref 3.5–5.1)
Sodium: 134 mmol/L — ABNORMAL LOW (ref 135–145)

## 2019-02-12 NOTE — Progress Notes (Signed)
Virtual Visit via Video Note  I connected with@  on 02/13/19 at 10:30 AM EST by a video enabled telemedicine application and verified that I am speaking with the correct person using two identifiers.  Location patient: home Location provider:work  Persons participating in the virtual visit: patient, provider  I discussed the limitations of evaluation and management by telemedicine and the availability of in person appointments. The patient expressed understanding and agreed to proceed.   HPI: Accompanied by daughter today.  CC: weakness and fatigue for 2 weeks since hospitalization, some improvement.  2 falls since she has been home. No LOC.   Has done PT last summer. She uses walker only 50% of time and daughter 'wants her to use a bedside commode' Daughter has talked with her mom about having someone come into the house; patient had given herself too much insulin one time 2 weeks ago when had COVID. Daughter had long discussion with mother about his with Dr Kelton Pillar, and advised her not to increase again. She increased her insulin on her own this week. No LOC, syncope.   Fell 3 days ago and bruised her right side of ribs. States 'tripped over her feet.' Was using a bedside commode and describes 'knees were weak' and she fell into nightstand. Denies confusion. exertional chest pain or pressure, numbness or tingling radiating to left arm or jaw, palpitations, dizziness, frequent headaches, changes in vision, or shortness of breath.   Sa02 96%, HR 65 right now sitting.  Daughter has noticed mom will speak more labored with exertion. Walking and sa02 94%, HR 85. Hasnt been wearing O2 as hasnt been needed.    Take tylenol #3 in the morning; she doesn't feel mediciton making her drowsey, or contributes to falls.    Anemia- no blood seen in stool   CKD - increased [facility], independent living.  Provide her with cleaning, one meal per day.   CXR showed heart  of normal size 01/2019  ROS: See pertinent positives and negatives per HPI.  Past Medical History:  Diagnosis Date  . Chronic kidney disease 08/07/2016   Chronic renal failure, stage III  . Depression   . Esophageal dysphagia   . Gastric ulcer   . GERD (gastroesophageal reflux disease)   . Hyperlipidemia   . Hypertension   . Hypothyroidism   . IDDM (insulin dependent diabetes mellitus)   . Ischemic colitis ([place])   . Osteoarthritis   . Ulcer of esophagus     Past Surgical History:  Procedure Laterality Date  . ABDOMINAL HYSTERECTOMY    . COLONOSCOPY  2010  . ESOPHAGEAL DILATION  08/07/2016   usually a couple times a year, last time 07/30/16  . GALLBLADDER SURGERY    . LUMBAR LAMINECTOMY/DECOMPRESSION MICRODISCECTOMY N/A 08/10/2016   Procedure: BILATERAL HEMILAMINECTOMY LUMBAR THREE-FOUR,LUMBAR FOUR-FIVE,LEFT LUMBAR FIVE-SACRAL ONE HEMILAMINECTOMY AND DECOMPRESSION;  Surgeon: Ashok Pall, MD;  Location: [facility];  Service: Neurosurgery;  Laterality: N/A;  . THYROIDECTOMY      Family History  Problem Relation Age of Onset  . Diabetes Mother   . Arthritis Mother   . Hyperlipidemia Mother   . Mental illness Mother   . Heart disease Father   . Mental illness Sister   . Arthritis Maternal Grandmother   . Arthritis Maternal Grandfather   . Colon cancer Neg Hx   . Stomach cancer Neg Hx   . Esophageal cancer Neg Hx     SOCIAL HX: never smoker   Current  Outpatient Medications:  .  acetaminophen-codeine (TYLENOL #3) 300-30 MG tablet, TAKE 1 TABLET TWICE A DAY AS NEEDED FOR SEVERE PAIN (Patient taking differently: Take 1 tablet by mouth 2 (two) times daily as needed for severe pain. ), Disp: 20 tablet, Rfl: 0 .  acidophilus (RISAQUAD) CAPS capsule, Take 1 capsule by mouth daily., Disp: , Rfl:  .  albuterol (PROVENTIL HFA;VENTOLIN HFA) 108 (90 Base) MCG/ACT inhaler, Inhale 2 puffs into the lungs every 6 (six) hours as needed for wheezing or shortness of breath., Disp: 1 Inhaler,  Rfl: 2 .  amitriptyline (ELAVIL) 75 MG tablet, Take 2 tablets (150 mg total) by mouth at bedtime., Disp: 180 tablet, Rfl: 4 .  BD INSULIN SYRINGE U/F 31G X 5/16" 1 ML MISC, USE AS DIRECTED TO ADMINISTER INSULIN, Disp: 100 each, Rfl: 5 .  Cholecalciferol (VITAMIN D) 2000 units tablet, Take 2,000 Units by mouth daily. , Disp: , Rfl:  .  Coenzyme Q10 (CO Q 10) 100 MG CAPS, Take 1 capsule by mouth daily., Disp: , Rfl:  .  Continuous Blood Gluc Sensor (FREESTYLE LIBRE 14 DAY SENSOR) MISC, 1 Package by Does not apply route every 14 (fourteen) days., Disp: 1 each, Rfl: 0 .  CRANBERRY PO, Take 1 tablet by mouth every Monday, Wednesday, and Friday., Disp: , Rfl:  .  docusate sodium (COLACE) 100 MG capsule, Take 1 capsule (100 mg total) by mouth 2 (two) times daily. (Patient taking differently: Take 100 mg by mouth every other day. ), Disp: 10 capsule, Rfl: 0 .  gabapentin (NEURONTIN) 100 MG capsule, TAKE 2 CAPSULES THREE TIMES A DAY (Patient taking differently: Take 200 mg by mouth 3 (three) times daily. TAKE 2 CAPSULES THREE TIMES A DAY), Disp: 540 capsule, Rfl: 1 .  glucagon (GLUCAGON EMERGENCY) 1 MG injection, Inject 1 mg into the vein once as needed. For hypoglycemic episodes. E11.42, Disp: 1 each, Rfl: 3 .  Insulin Glargine (LANTUS SOLOSTAR) 100 UNIT/ML Solostar Pen, Inject 8 Units into the skin at bedtime., Disp: 15 mL, Rfl: 11 .  Insulin Pen Needle (BD PEN NEEDLE NANO U/F) 32G X 4 MM MISC, 1 Device by Does not apply route daily., Disp: 150 each, Rfl: 3 .  insulin regular (HUMULIN R) 100 units/mL injection, Inject 0.06 mLs (6 Units total) into the skin 3 (three) times daily before meals., Disp: 30 mL, Rfl: 3 .  Insulin Syringe-Needle U-100 (BD INSULIN SYRINGE U/F) 31G X 5/16" 0.3 ML MISC, Use four times a day., Disp: 4 each, Rfl: 0 .  levothyroxine (SYNTHROID) 150 MCG tablet, TAKE 1 TABLET DAILY, Disp: 90 tablet, Rfl: 3 .  lisinopril (PRINIVIL,ZESTRIL) 10 MG tablet, TAKE 1 TABLET DAILY (Patient taking  differently: Take 5 mg by mouth daily. ), Disp: 90 tablet, Rfl: 3 .  omeprazole (PRILOSEC) 40 MG capsule, Take 1 capsule (40 mg total) by mouth 2 (two) times daily., Disp: 180 capsule, Rfl: 3 .  ondansetron (ZOFRAN ODT) 4 MG disintegrating tablet, Take 1 tablet (4 mg total) by mouth every 8 (eight) hours as needed for nausea or vomiting., Disp: 30 tablet, Rfl: 0 .  pyridOXINE (VITAMIN B-6) 100 MG tablet, Take 100 mg by mouth daily., Disp: , Rfl:  .  riboflavin (VITAMIN B-2) 100 MG TABS tablet, Take 100 mg by mouth daily., Disp: , Rfl:  .  simvastatin (ZOCOR) 40 MG tablet, TAKE 1 TABLET AT BEDTIME (Patient taking differently: Take 40 mg by mouth 3 (three) times a week. (at bedtime)), Disp: 90 tablet, Rfl: 3 .  Turmeric 500 MG TABS, Take 750 mg by mouth 2 (two) times daily., Disp: , Rfl:  .  cyanocobalamin (,VITAMIN B-12,) 1000 MCG/ML injection, 1000 mcg (1 mg) injection once per week for four weeks, followed by 1000 mcg injection once per month., Disp: 1 mL, Rfl: 15  EXAM:  VITALS per patient if applicable:  GENERAL: alert, oriented, appears well and in no acute distress  HEENT: atraumatic, conjunttiva clear, no obvious abnormalities on inspection of external nose and ears  NECK: normal movements of the head and neck  LUNGS: on inspection no signs of respiratory distress, breathing rate appears normal, no obvious gross SOB, gasping or wheezing  CV: no obvious cyanosis  MS: moves all visible extremities without noticeable abnormality. NO bruises on right side rib cage appreciated on video.   PSYCH/NEURO: pleasant and cooperative, no obvious depression or anxiety, speech and thought processing grossly intact  ASSESSMENT AND PLAN:  Discussed the following assessment and plan:  Fatigue, unspecified type - Plan: Ambulatory referral to Home Health, Basic metabolic panel, cyanocobalamin (,VITAMIN B-12,) 1000 MCG/ML injection  DM (diabetes mellitus), type 2, uncontrolled, with renal  complications (HCC)  Anemia, unspecified type - Plan: Fecal occult blood, imunochemical, IBC + Ferritin Problem List Items Addressed This Visit      Endocrine   DM (diabetes mellitus), type 2, uncontrolled, with renal complications (HCC)    Concerned with patient giving herself additional insulin and referred patient to home health for additional education and help with medication. Following Dr Lonzo Cloud. Advised patient not to give herself extra insulin than prescribed unless her daughter is present and they have consulted Dr Lonzo Cloud. Advised daughter to call Dr Lonzo Cloud again to continue to discuss safe regimen in regards to insulin administration. Will follow.         Other   Anemia    Pending iron studies, stool cards Repeat BMP due to increase in Crt.      Relevant Medications   cyanocobalamin (,VITAMIN B-12,) 1000 MCG/ML injection   Other Relevant Orders   Fecal occult blood, imunochemical   IBC + Ferritin   Fatigue - Primary    Suspect post viral due to Covid especially in an 86 year old. She describes tripping. Her falls appear to more clumsy or related to knee pain. Reassured by walking SaO2 and heart rate.  Concern with her falls were discussed at length with daughter and patient that she needs to have discussions with mother about having more help , possibly moving from independent living into assisted living to the nursing home in [place].  [name] will continue to have these conversations with her mother. We will start B12 injections, patient will discontinue oral B12.  Her B12 has been normal in the past which were explained to patient and daughter however this is been quite helpful for her energy in the past.   Referral to home health with RN and PT services.       Relevant Medications   cyanocobalamin (,VITAMIN B-12,) 1000 MCG/ML injection   Other Relevant Orders   Ambulatory referral to Home Health   Basic metabolic panel      -we discussed possible serious and  likely etiologies, options for evaluation and workup, limitations of telemedicine visit vs in person visit, treatment, treatment risks and precautions. Pt prefers to treat via telemedicine empirically rather then risking or undertaking an in person visit at this moment. Patient agrees to seek prompt in person care if worsening, new symptoms arise, or if is not improving with  treatment.   I discussed the assessment and treatment plan with the patient. The patient was provided an opportunity to ask questions and all were answered. The patient agreed with the plan and demonstrated an understanding of the instructions.   The patient was advised to call back or seek an in-person evaluation if the symptoms worsen or if the condition fails to improve as anticipated.   Rennie Plowman, FNP

## 2019-02-13 ENCOUNTER — Encounter: Payer: Self-pay | Admitting: Family

## 2019-02-13 ENCOUNTER — Telehealth: Payer: Self-pay

## 2019-02-13 ENCOUNTER — Ambulatory Visit (INDEPENDENT_AMBULATORY_CARE_PROVIDER_SITE_OTHER): Payer: Medicare HMO | Admitting: Family

## 2019-02-13 ENCOUNTER — Other Ambulatory Visit: Payer: Self-pay

## 2019-02-13 VITALS — Ht 66.0 in | Wt 143.0 lb

## 2019-02-13 DIAGNOSIS — E1129 Type 2 diabetes mellitus with other diabetic kidney complication: Secondary | ICD-10-CM

## 2019-02-13 DIAGNOSIS — R5383 Other fatigue: Secondary | ICD-10-CM | POA: Diagnosis not present

## 2019-02-13 DIAGNOSIS — E1165 Type 2 diabetes mellitus with hyperglycemia: Secondary | ICD-10-CM

## 2019-02-13 DIAGNOSIS — D649 Anemia, unspecified: Secondary | ICD-10-CM | POA: Diagnosis not present

## 2019-02-13 DIAGNOSIS — IMO0002 Reserved for concepts with insufficient information to code with codable children: Secondary | ICD-10-CM

## 2019-02-13 MED ORDER — CYANOCOBALAMIN 1000 MCG/ML IJ SOLN: INTRAMUSCULAR | 15 refills | Status: DC

## 2019-02-13 NOTE — Assessment & Plan Note (Addendum)
Suspect post viral due to Covid especially in an 86 year old. She describes tripping. Her falls appear to more clumsy or related to knee pain. Reassured by walking SaO2 and heart rate.  Concern with her falls were discussed at length with daughter and patient that she needs to have discussions with mother about having more help , possibly moving from independent living into assisted living to the nursing home in [place].  [name] will continue to have these conversations with her mother. We will start B12 injections, patient will discontinue oral B12.  Her B12 has been normal in the past which were explained to patient and daughter however this is been quite helpful for her energy in the past.   Referral to home health with RN and PT services.

## 2019-02-13 NOTE — Assessment & Plan Note (Addendum)
Concerned with patient giving herself additional insulin and referred patient to home health for additional education and help with medication. Following Dr Lonzo Cloud. Advised patient not to give herself extra insulin than prescribed unless her daughter is present and they have consulted Dr Lonzo Cloud. Advised daughter to call Dr Lonzo Cloud again to continue to discuss safe regimen in regards to insulin administration. Will follow.

## 2019-02-13 NOTE — Assessment & Plan Note (Signed)
Pending iron studies, stool cards Repeat BMP due to increase in Crt.

## 2019-02-13 NOTE — Telephone Encounter (Signed)
Patient had a virtual visit today and a stool card kit was left at the front for the patient to pick up when coming in for labs, patient was informed .  Fawn Desrocher,cma

## 2019-02-17 ENCOUNTER — Telehealth: Payer: Self-pay | Admitting: Family

## 2019-02-17 ENCOUNTER — Other Ambulatory Visit: Payer: Self-pay

## 2019-02-17 ENCOUNTER — Other Ambulatory Visit (INDEPENDENT_AMBULATORY_CARE_PROVIDER_SITE_OTHER): Payer: Medicare HMO

## 2019-02-17 DIAGNOSIS — R5383 Other fatigue: Secondary | ICD-10-CM

## 2019-02-17 DIAGNOSIS — D649 Anemia, unspecified: Secondary | ICD-10-CM

## 2019-02-17 NOTE — Telephone Encounter (Signed)
Pt daughter called and said that Amy Calhoun is not in-network with her mothers insurance and they need a different home health

## 2019-02-18 LAB — BASIC METABOLIC PANEL
BUN: 19 mg/dL (ref 6–23)
CO2: 27 mEq/L (ref 19–32)
Calcium: 8.6 mg/dL (ref 8.4–10.5)
Chloride: 100 mEq/L (ref 96–112)
Creatinine, Ser: 1.11 mg/dL (ref 0.40–1.20)
GFR: 46.34 mL/min — ABNORMAL LOW (ref >60.00)
Glucose, Bld: 429 mg/dL — ABNORMAL HIGH (ref 70–99)
Potassium: 4.1 mEq/L (ref 3.5–5.1)
Sodium: 136 mEq/L (ref 135–145)

## 2019-02-18 LAB — IBC + FERRITIN
Ferritin: 53.7 ng/mL (ref 10.0–291.0)
Iron: 68 ug/dL (ref 42–145)
Saturation Ratios: 22.5 % (ref 20.0–50.0)
Transferrin: 216 mg/dL (ref 212.0–360.0)

## 2019-02-23 ENCOUNTER — Other Ambulatory Visit: Payer: Self-pay | Admitting: Family

## 2019-02-23 DIAGNOSIS — D649 Anemia, unspecified: Secondary | ICD-10-CM

## 2019-02-23 MED ORDER — FERROUS SULFATE 325 (65 FE) MG PO TBEC: 325.0000 mg | DELAYED_RELEASE_TABLET | Freq: Three times a day (TID) | ORAL | 1 refills | Status: DC

## 2019-02-24 ENCOUNTER — Encounter: Payer: Self-pay | Admitting: Family

## 2019-02-25 ENCOUNTER — Other Ambulatory Visit: Payer: Self-pay

## 2019-02-25 DIAGNOSIS — D649 Anemia, unspecified: Secondary | ICD-10-CM

## 2019-02-25 MED ORDER — FERROUS SULFATE 325 (65 FE) MG PO TBEC: 325.0000 mg | DELAYED_RELEASE_TABLET | Freq: Three times a day (TID) | ORAL | 1 refills | Status: DC

## 2019-02-26 NOTE — Progress Notes (Signed)
Before I call what labs should be ordered for patient?

## 2019-03-03 ENCOUNTER — Other Ambulatory Visit (INDEPENDENT_AMBULATORY_CARE_PROVIDER_SITE_OTHER): Payer: Medicare HMO

## 2019-03-03 DIAGNOSIS — D649 Anemia, unspecified: Secondary | ICD-10-CM | POA: Diagnosis not present

## 2019-03-03 LAB — FECAL OCCULT BLOOD, IMMUNOCHEMICAL: Fecal Occult Bld: NEGATIVE

## 2019-03-04 ENCOUNTER — Telehealth: Payer: Self-pay | Admitting: Family

## 2019-03-04 NOTE — Telephone Encounter (Signed)
Pt was returning a call to [facility] but I did not see where Shanda Bumps had called? Please advise.

## 2019-03-04 NOTE — Telephone Encounter (Signed)
This has already been taken care of last week. No need for callback.

## 2019-03-05 NOTE — Progress Notes (Signed)
Done

## 2019-03-16 ENCOUNTER — Telehealth: Payer: Self-pay | Admitting: Family

## 2019-03-16 NOTE — Telephone Encounter (Signed)
Noted. Signed.

## 2019-03-16 NOTE — Telephone Encounter (Signed)
I spoke with patient's daughter who gave me an update on patient. She has been doing so much better on the B-12 injections. Due to constipation she has been giving her mom iron every other day. She said that her gait isn't great but she was pleased with how well her mom was doing. She said that after walking her O2 generally is between 93-96%. She said she is only SOB when walking far distances.  I have printed letter for you to sign so I can fax note to [employer].

## 2019-03-16 NOTE — Telephone Encounter (Signed)
Call daughter, [name] I think that is perfectly fine however I like some objective information prior to doing so.  Please document a recent SaO2 without oxygen of course.  Please ask if any shortness of breath with walking. Then you may call and discontinue

## 2019-03-16 NOTE — Telephone Encounter (Signed)
I just want to be sure that this is okay for me to call & d/c.

## 2019-03-16 NOTE — Telephone Encounter (Signed)
Pt's daughter called and states that mother no longer needs oxygen and they are billing her every month. She states that [employer] needs permission from PCP to stop order and pick up oxygen tank. Their fax number is [phone]

## 2019-03-17 ENCOUNTER — Other Ambulatory Visit: Payer: Self-pay | Admitting: Family

## 2019-03-17 DIAGNOSIS — J9601 Acute respiratory failure with hypoxia: Secondary | ICD-10-CM

## 2019-03-17 DIAGNOSIS — U071 COVID-19: Secondary | ICD-10-CM

## 2019-03-19 ENCOUNTER — Other Ambulatory Visit: Payer: Self-pay | Admitting: Family

## 2019-03-19 DIAGNOSIS — D649 Anemia, unspecified: Secondary | ICD-10-CM

## 2019-04-01 ENCOUNTER — Other Ambulatory Visit: Payer: Self-pay | Admitting: Family

## 2019-04-01 DIAGNOSIS — M1612 Unilateral primary osteoarthritis, left hip: Secondary | ICD-10-CM

## 2019-04-03 NOTE — Telephone Encounter (Signed)
I looked up patient on [place] Controlled Substances Reporting System and saw no activity that raised concern of inappropriate use.   

## 2019-04-23 ENCOUNTER — Other Ambulatory Visit: Payer: Self-pay

## 2019-04-23 ENCOUNTER — Telehealth: Payer: Self-pay | Admitting: Family

## 2019-04-23 NOTE — Telephone Encounter (Signed)
I spoke to the patient's daughter Amy Calhoun in reference to he mother being seen in office. I explained that we have regulations that have been put in place for the safety of the staff and other patients. [name] stated that her mother had received the covid vaccine and the health department stated that patients can be seen in office. I informed her that we are not associated with the health department and the regulations that we have set in place have been given by [facility] and our providers. I stated that a lot of people are being vaccinated, maybe the restrictions may be eased going forward. However, for now we are still wearing our mask , washing our hands and keeping our distance. [name] explained that her mother does need to be seen in office due to her having diabetes . I stated that I understood and hopefully going forward we may be able to see her in  in office.

## 2019-05-06 ENCOUNTER — Ambulatory Visit: Payer: Medicare HMO | Admitting: Family

## 2019-05-15 ENCOUNTER — Encounter: Payer: Self-pay | Admitting: Family

## 2019-05-15 ENCOUNTER — Telehealth (INDEPENDENT_AMBULATORY_CARE_PROVIDER_SITE_OTHER): Payer: Medicare HMO | Admitting: Family

## 2019-05-15 VITALS — Ht 64.0 in | Wt 143.0 lb

## 2019-05-15 DIAGNOSIS — M4726 Other spondylosis with radiculopathy, lumbar region: Secondary | ICD-10-CM | POA: Diagnosis not present

## 2019-05-15 DIAGNOSIS — D649 Anemia, unspecified: Secondary | ICD-10-CM | POA: Diagnosis not present

## 2019-05-15 DIAGNOSIS — N183 Chronic kidney disease, stage 3 unspecified: Secondary | ICD-10-CM

## 2019-05-15 DIAGNOSIS — M1612 Unilateral primary osteoarthritis, left hip: Secondary | ICD-10-CM

## 2019-05-15 DIAGNOSIS — R5383 Other fatigue: Secondary | ICD-10-CM

## 2019-05-15 DIAGNOSIS — E785 Hyperlipidemia, unspecified: Secondary | ICD-10-CM

## 2019-05-15 NOTE — Assessment & Plan Note (Addendum)
Pending cmp

## 2019-05-15 NOTE — Assessment & Plan Note (Signed)
Stable, continues to do well on Tylenol 3 twice per day.  Due CKD, NO nsaids. No recent falls.  Patient will stay for medication.  She is using appropriately.  We will continue

## 2019-05-15 NOTE — Assessment & Plan Note (Signed)
Pleased to hear that improved.  Pending repeat lab evaluation.

## 2019-05-15 NOTE — Progress Notes (Signed)
Virtual Visit via Video Note  I connected with@  on 05/15/19 at  4:00 PM EDT by a video enabled telemedicine application and verified that I am speaking with the correct person using two identifiers.  Location patient: home Location provider:work  Persons participating in the virtual visit: patient, provider  I discussed the limitations of evaluation and management by telemedicine and the availability of in person appointments. The patient expressed understanding and agreed to proceed.   HPI: Accompanied by Etheleen Mayhew well today. No complaints.  Very active. Walking daily.  Feels better and fatigue has improved. Daughter thinks improvement related to  IM B12 and iron.   Left hip pain- takes tylenol #3 BID. Feels well on regimen.  No falls in over 6 weeks.   Never did hear from home health referral. Doesn't think she needs home health at this time anymore.  Anemia- taking iron QOD  CKD  Dm II- follows with Dr Lonzo Cloud; last a1c 7.4%    ROS: See pertinent positives and negatives per HPI.  Past Medical History:  Diagnosis Date  . Chronic kidney disease 08/07/2016   Chronic renal failure, stage III  . Depression   . Esophageal dysphagia   . Gastric ulcer   . GERD (gastroesophageal reflux disease)   . Hyperlipidemia   . Hypertension   . Hypothyroidism   . IDDM (insulin dependent diabetes mellitus)   . Ischemic colitis (HCC)   . Osteoarthritis   . Ulcer of esophagus     Past Surgical History:  Procedure Laterality Date  . ABDOMINAL HYSTERECTOMY    . COLONOSCOPY  2010  . ESOPHAGEAL DILATION  08/07/2016   usually a couple times a year, last time 07/30/16  . GALLBLADDER SURGERY    . LUMBAR LAMINECTOMY/DECOMPRESSION MICRODISCECTOMY N/A 08/10/2016   Procedure: BILATERAL HEMILAMINECTOMY LUMBAR THREE-FOUR,LUMBAR FOUR-FIVE,LEFT LUMBAR FIVE-SACRAL ONE HEMILAMINECTOMY AND DECOMPRESSION;  Surgeon: Coletta Memos, MD;  Location: [facility] OR;  Service: Neurosurgery;  Laterality: N/A;   . THYROIDECTOMY      Family History  Problem Relation Age of Onset  . Diabetes Mother   . Arthritis Mother   . Hyperlipidemia Mother   . Mental illness Mother   . Heart disease Father   . Mental illness Sister   . Arthritis Maternal Grandmother   . Arthritis Maternal Grandfather   . Colon cancer Neg Hx   . Stomach cancer Neg Hx   . Esophageal cancer Neg Hx        Current Outpatient Medications:  .  acetaminophen-codeine (TYLENOL #3) 300-30 MG tablet, TAKE 1 TABLET TWICE A DAY AS NEEDED FOR SEVERE PAIN, Disp: 60 tablet, Rfl: 1 .  albuterol (PROVENTIL HFA;VENTOLIN HFA) 108 (90 Base) MCG/ACT inhaler, Inhale 2 puffs into the lungs every 6 (six) hours as needed for wheezing or shortness of breath., Disp: 1 Inhaler, Rfl: 2 .  amitriptyline (ELAVIL) 75 MG tablet, Take 2 tablets (150 mg total) by mouth at bedtime., Disp: 180 tablet, Rfl: 4 .  BD INSULIN SYRINGE U/F 31G X 5/16" 1 ML MISC, USE AS DIRECTED TO ADMINISTER INSULIN, Disp: 100 each, Rfl: 5 .  Cholecalciferol (VITAMIN D) 2000 units tablet, Take 2,000 Units by mouth daily. , Disp: , Rfl:  .  Coenzyme Q10 (CO Q 10) 100 MG CAPS, Take 1 capsule by mouth daily., Disp: , Rfl:  .  Continuous Blood Gluc Sensor (FREESTYLE LIBRE 14 DAY SENSOR) MISC, 1 Package by Does not apply route every 14 (fourteen) days., Disp: 1 each, Rfl: 0 .  CRANBERRY  PO, Take 1 tablet by mouth every Monday, Wednesday, and Friday., Disp: , Rfl:  .  cyanocobalamin (,VITAMIN B-12,) 1000 MCG/ML injection, 1000 mcg (1 mg) injection once per week for four weeks, followed by 1000 mcg injection once per month., Disp: 1 mL, Rfl: 15 .  docusate sodium (COLACE) 100 MG capsule, Take 1 capsule (100 mg total) by mouth 2 (two) times daily. (Patient taking differently: Take 100 mg by mouth every other day. ), Disp: 10 capsule, Rfl: 0 .  ferrous sulfate 325 (65 FE) MG EC tablet, Take 1 tablet (325 mg total) by mouth 3 (three) times daily with meals., Disp: 90 tablet, Rfl: 1 .   gabapentin (NEURONTIN) 100 MG capsule, Take 2 capsules (200 mg total) by mouth 3 (three) times daily. TAKE 2 CAPSULES THREE TIMES A DAY, Disp: 180 capsule, Rfl: 1 .  glucagon (GLUCAGON EMERGENCY) 1 MG injection, Inject 1 mg into the vein once as needed. For hypoglycemic episodes. E11.42, Disp: 1 each, Rfl: 3 .  Insulin Glargine (LANTUS SOLOSTAR) 100 UNIT/ML Solostar Pen, Inject 8 Units into the skin at bedtime., Disp: 15 mL, Rfl: 11 .  Insulin Pen Needle (BD PEN NEEDLE NANO U/F) 32G X 4 MM MISC, 1 Device by Does not apply route daily., Disp: 150 each, Rfl: 3 .  insulin regular (HUMULIN R) 100 units/mL injection, Inject 0.06 mLs (6 Units total) into the skin 3 (three) times daily before meals., Disp: 30 mL, Rfl: 3 .  Insulin Syringe-Needle U-100 (BD INSULIN SYRINGE U/F) 31G X 5/16" 0.3 ML MISC, Use four times a day., Disp: 4 each, Rfl: 0 .  levothyroxine (SYNTHROID) 150 MCG tablet, TAKE 1 TABLET DAILY, Disp: 90 tablet, Rfl: 3 .  lisinopril (PRINIVIL,ZESTRIL) 10 MG tablet, TAKE 1 TABLET DAILY (Patient taking differently: Take 5 mg by mouth daily. ), Disp: 90 tablet, Rfl: 3 .  omeprazole (PRILOSEC) 40 MG capsule, Take 1 capsule (40 mg total) by mouth 2 (two) times daily., Disp: 180 capsule, Rfl: 3 .  ondansetron (ZOFRAN ODT) 4 MG disintegrating tablet, Take 1 tablet (4 mg total) by mouth every 8 (eight) hours as needed for nausea or vomiting., Disp: 30 tablet, Rfl: 0 .  pyridOXINE (VITAMIN B-6) 100 MG tablet, Take 100 mg by mouth daily., Disp: , Rfl:  .  riboflavin (VITAMIN B-2) 100 MG TABS tablet, Take 100 mg by mouth daily., Disp: , Rfl:  .  simvastatin (ZOCOR) 40 MG tablet, TAKE 1 TABLET AT BEDTIME (Patient taking differently: Take 40 mg by mouth 3 (three) times a week. (at bedtime)), Disp: 90 tablet, Rfl: 3 .  Turmeric 500 MG TABS, Take 750 mg by mouth 2 (two) times daily., Disp: , Rfl:   EXAM:  VITALS per patient if applicable:  GENERAL: alert, oriented, appears well and in no acute  distress  HEENT: atraumatic, conjunttiva clear, no obvious abnormalities on inspection of external nose and ears  NECK: normal movements of the head and neck  LUNGS: on inspection no signs of respiratory distress, breathing rate appears normal, no obvious gross SOB, gasping or wheezing  CV: no obvious cyanosis  MS: moves all visible extremities without noticeable abnormality  PSYCH/NEURO: pleasant and cooperative, no obvious depression or anxiety, speech and thought processing grossly intact  ASSESSMENT AND PLAN:  Discussed the following assessment and plan:  Osteoarthritis of spine with radiculopathy, lumbar region  Fatigue, unspecified type - Plan: CBC with Differential/Platelet, Lipid panel, B12 and Folate Panel, IBC + Ferritin  Hyperlipidemia, unspecified hyperlipidemia type - Plan:  Comprehensive metabolic panel  Anemia, unspecified type - Plan: CBC with Differential/Platelet, B12 and Folate Panel, IBC + Ferritin  Stage 3 chronic kidney disease, unspecified whether stage 3a or 3b CKD  Primary osteoarthritis of left hip Problem List Items Addressed This Visit      Nervous and Auditory   Osteoarthritis of spine with radiculopathy, lumbar region - Primary     Musculoskeletal and Integument   Primary osteoarthritis of left hip    Stable, continues to do well on Tylenol 3 twice per day.  Due CKD, NO nsaids. No recent falls.  Patient will stay for medication.  She is using appropriately.  We will continue        Genitourinary   CKD (chronic kidney disease) stage 3, GFR 30-59 ml/min (HCC)    Pending cmp        Other   Anemia    Suspect improved. Has been on B12, iron.  Pending lab studies      Relevant Orders   CBC with Differential/Platelet   B12 and Folate Panel   IBC + Ferritin   Fatigue    Pleased to hear that improved.  Pending repeat lab evaluation.      Relevant Orders   CBC with Differential/Platelet   Lipid panel   B12 and Folate Panel   IBC +  Ferritin   Hyperlipidemia   Relevant Orders   Comprehensive metabolic panel     -we discussed possible serious and likely etiologies, options for evaluation and workup, limitations of telemedicine visit vs in person visit, treatment, treatment risks and precautions. Pt prefers to treat via telemedicine empirically rather then risking or undertaking an in person visit at this moment. Patient agrees to seek prompt in person care if worsening, new symptoms arise, or if is not improving with treatment.   I discussed the assessment and treatment plan with the patient. The patient was provided an opportunity to ask questions and all were answered. The patient agreed with the plan and demonstrated an understanding of the instructions.   The patient was advised to call back or seek an in-person evaluation if the symptoms worsen or if the condition fails to improve as anticipated.   Rennie Plowman, FNP

## 2019-05-15 NOTE — Assessment & Plan Note (Signed)
Suspect improved. Has been on B12, iron.  Pending lab studies

## 2019-06-02 ENCOUNTER — Telehealth: Payer: Self-pay | Admitting: Family

## 2019-06-02 NOTE — Telephone Encounter (Signed)
I spoke with [name] who wanted to hold off on xray. I did schedule fasting labs & a follow-up for June.

## 2019-06-02 NOTE — Telephone Encounter (Signed)
Call pt Order for  Chest xray  Has expired; we discussed this In January after abnormal CXR in ED  If patient still feels that they need, please advise to make a f/u appt with me

## 2019-06-04 ENCOUNTER — Other Ambulatory Visit (INDEPENDENT_AMBULATORY_CARE_PROVIDER_SITE_OTHER): Payer: Medicare HMO

## 2019-06-04 ENCOUNTER — Other Ambulatory Visit: Payer: Self-pay

## 2019-06-04 DIAGNOSIS — R5383 Other fatigue: Secondary | ICD-10-CM

## 2019-06-04 DIAGNOSIS — D649 Anemia, unspecified: Secondary | ICD-10-CM

## 2019-06-04 DIAGNOSIS — E785 Hyperlipidemia, unspecified: Secondary | ICD-10-CM

## 2019-06-04 LAB — CBC WITH DIFFERENTIAL/PLATELET
Basophils Absolute: 0.1 10*3/uL (ref 0.0–0.1)
Basophils Relative: 0.8 % (ref 0.0–3.0)
Eosinophils Absolute: 0.2 10*3/uL (ref 0.0–0.7)
Eosinophils Relative: 3.1 % (ref 0.0–5.0)
HCT: 37.2 % (ref 36.0–46.0)
Hemoglobin: 12.4 g/dL (ref 12.0–15.0)
Lymphocytes Relative: 43 % (ref 12.0–46.0)
Lymphs Abs: 2.8 10*3/uL (ref 0.7–4.0)
MCHC: 33.4 g/dL (ref 30.0–36.0)
MCV: 87.8 fl (ref 78.0–100.0)
Monocytes Absolute: 0.6 10*3/uL (ref 0.1–1.0)
Monocytes Relative: 8.7 % (ref 3.0–12.0)
Neutro Abs: 2.9 10*3/uL (ref 1.4–7.7)
Neutrophils Relative %: 44.4 % (ref 43.0–77.0)
Platelets: 179 10*3/uL (ref 150.0–400.0)
RBC: 4.24 Mil/uL (ref 3.87–5.11)
RDW: 14.1 % (ref 11.5–15.5)
WBC: 6.5 10*3/uL (ref 4.0–10.5)

## 2019-06-04 LAB — B12 AND FOLATE PANEL
Folate: 9.4 ng/mL (ref >5.9)
Vitamin B-12: 635 pg/mL (ref 211–911)

## 2019-06-04 LAB — COMPREHENSIVE METABOLIC PANEL
ALT: 18 U/L (ref 0–35)
AST: 20 U/L (ref 0–37)
Albumin: 3.8 g/dL (ref 3.5–5.2)
Alkaline Phosphatase: 77 U/L (ref 39–117)
BUN: 18 mg/dL (ref 6–23)
CO2: 29 mEq/L (ref 19–32)
Calcium: 8.7 mg/dL (ref 8.4–10.5)
Chloride: 102 mEq/L (ref 96–112)
Creatinine, Ser: 1.02 mg/dL (ref 0.40–1.20)
GFR: 51.06 mL/min — ABNORMAL LOW (ref >60.00)
Glucose, Bld: 258 mg/dL — ABNORMAL HIGH (ref 70–99)
Potassium: 4.2 mEq/L (ref 3.5–5.1)
Sodium: 136 mEq/L (ref 135–145)
Total Bilirubin: 0.5 mg/dL (ref 0.2–1.2)
Total Protein: 6.6 g/dL (ref 6.0–8.3)

## 2019-06-04 LAB — LIPID PANEL
Cholesterol: 161 mg/dL (ref 0–200)
HDL: 51.8 mg/dL (ref >39.00)
LDL Cholesterol: 82 mg/dL (ref 0–99)
NonHDL: 108.97
Total CHOL/HDL Ratio: 3
Triglycerides: 136 mg/dL (ref 0.0–149.0)
VLDL: 27.2 mg/dL (ref 0.0–40.0)

## 2019-06-04 LAB — IBC + FERRITIN
Ferritin: 62.8 ng/mL (ref 10.0–291.0)
Iron: 148 ug/dL — ABNORMAL HIGH (ref 42–145)
Saturation Ratios: 50.8 % — ABNORMAL HIGH (ref 20.0–50.0)
Transferrin: 208 mg/dL — ABNORMAL LOW (ref 212.0–360.0)

## 2019-06-08 ENCOUNTER — Other Ambulatory Visit: Payer: Self-pay | Admitting: Family

## 2019-06-20 ENCOUNTER — Other Ambulatory Visit: Payer: Self-pay | Admitting: Family

## 2019-06-20 DIAGNOSIS — M1612 Unilateral primary osteoarthritis, left hip: Secondary | ICD-10-CM

## 2019-06-22 MED ORDER — ACETAMINOPHEN-CODEINE #3 300-30 MG PO TABS: 1.0000 | ORAL_TABLET | Freq: Two times a day (BID) | ORAL | 1 refills | Status: DC | PRN

## 2019-06-22 MED ORDER — ACETAMINOPHEN-CODEINE #3 300-30 MG PO TABS: 1.0000 | ORAL_TABLET | Freq: Two times a day (BID) | ORAL | 0 refills | Status: DC | PRN

## 2019-06-22 NOTE — Telephone Encounter (Signed)
Pt only have 2 pills left of Tylenol 3. She needs at least 7-10 days sent to [facility] on [address]. The rest needs to go to [facility]. She said she called them and they told her she didn't have any refills.

## 2019-06-22 NOTE — Telephone Encounter (Signed)
Let pt know [facility]  I looked up patient on [place] Controlled Substances Reporting System PMP AWARE and saw no activity that raised concern of inappropriate use.

## 2019-06-22 NOTE — Telephone Encounter (Signed)
Refill request for, last seen 05-15-19, last filled 04-03-19.  Please advise.

## 2019-06-22 NOTE — Addendum Note (Signed)
Addended by: Allegra Grana on: 06/22/2019 05:05 PM   Modules accepted: Orders

## 2019-06-30 ENCOUNTER — Other Ambulatory Visit: Payer: Self-pay | Admitting: Gastroenterology

## 2019-06-30 DIAGNOSIS — R1310 Dysphagia, unspecified: Secondary | ICD-10-CM

## 2019-06-30 DIAGNOSIS — K222 Esophageal obstruction: Secondary | ICD-10-CM

## 2019-07-12 ENCOUNTER — Other Ambulatory Visit: Payer: Self-pay | Admitting: Family

## 2019-07-12 DIAGNOSIS — R5383 Other fatigue: Secondary | ICD-10-CM

## 2019-07-14 ENCOUNTER — Telehealth (INDEPENDENT_AMBULATORY_CARE_PROVIDER_SITE_OTHER): Payer: Medicare HMO | Admitting: Family

## 2019-07-14 ENCOUNTER — Encounter: Payer: Self-pay | Admitting: Family

## 2019-07-14 VITALS — BP 139/63 | HR 68 | Ht 64.0 in | Wt 143.0 lb

## 2019-07-14 DIAGNOSIS — D649 Anemia, unspecified: Secondary | ICD-10-CM | POA: Diagnosis not present

## 2019-07-14 DIAGNOSIS — I10 Essential (primary) hypertension: Secondary | ICD-10-CM

## 2019-07-14 DIAGNOSIS — E785 Hyperlipidemia, unspecified: Secondary | ICD-10-CM

## 2019-07-14 DIAGNOSIS — M1612 Unilateral primary osteoarthritis, left hip: Secondary | ICD-10-CM

## 2019-07-14 MED ORDER — LISINOPRIL 2.5 MG PO TABS: 2.5000 mg | ORAL_TABLET | Freq: Every day | ORAL | 1 refills | Status: DC

## 2019-07-14 MED ORDER — FERROUS SULFATE 325 (65 FE) MG PO TBEC: 325.0000 mg | DELAYED_RELEASE_TABLET | ORAL | 1 refills | Status: DC

## 2019-07-14 NOTE — Assessment & Plan Note (Addendum)
SBP slightly elevated. In setting of dizziness from suspected orthostatic hypotension, have moved from lisinopril 5mg  QD to lisinopril 2.5mg  prn > 140/90. Discussed standard of care to be on ACE-I in setting of DM however with recent symptoms, falls ( patient lives alone), daughter and I agreed that we are balancing safety. Patient and daughter agree to monitor blood pressure approx 3 times per week or when patient is symptomatic. Encouraged increased water intake.

## 2019-07-14 NOTE — Assessment & Plan Note (Signed)
Chronic, stable. Discussed tylenol #3 at length today and do not feel contributed to falls at this time. Patient is taking as prescribed and we will monitor closely for adverse effects to ensure safety balanced with quality of life, pain control.

## 2019-07-14 NOTE — Patient Instructions (Addendum)
May use lisinopril 2.5mg  as needed for blood pressure 140/90.  It is imperative that you are seen AT least twice per year for labs and monitoring. Monitor blood pressure at home and me 5-6 reading on separate days. Goal is less than 130/80, if persistently higher, please make sooner follow up appointment so we can recheck you blood pressure and manage/ adjust medications.  Increase water, propel.

## 2019-07-14 NOTE — Progress Notes (Signed)
Verbal consent for services obtained from patient prior to services given to TELEPHONE visit:   Location of call:  provider at work patient at home  Names of all persons present for services: Rennie Plowman, NP and patient Follow up Dizziness has since resolved. Memory and energy has improved.   Fall 8 days ago, patient fell on front porch as she was sitting out there most of the day putting together a puzzle. Daughter felt she was dehydrated and it was hot outside. Patient got up stand and then fell forward on hands and knees. No head injury, loc. Daughter got mom to couch, patient was fine and felt better after rest. No cp, diaphoresis, left arm numbness, HA.  Daughter checked pt's blood pressure while sitting and it was 142/52 and then standing it dropped to 102/47. Blood sugar was 200. Doesn't feel she was hypoglycemic.  Patient reports falling a second time after daughter left. She got up to go to bathroom from chair, and fell again as she was dizziness.  Since then, she has increased water intake and propel. Urine is more dark in color.   HTN- 139/63, which is OFF lisinopril. Had been on 5mg  lisinopril.  Daughter discontinued lisinopril one week ago  Taking iron QOD with vitamin C.   HLD- taking simvastatin 3x per week. If takes more than that, she feels it effects memory.   Low back and left hip pain- takes tylenol twice per day with pain wel controlled. In bottle has 30 tablets, refilled on 07/05/19    BP Readings from Last 3 Encounters:  07/14/19 139/63  01/29/19 (!) 143/60  01/01/19 (!) 118/58   HR 68   A/P/next steps: Problem List Items Addressed This Visit      Cardiovascular and Mediastinum   HTN (hypertension), malignant - Primary    SBP slightly elevated. In setting of dizziness from suspected orthostatic hypotension, have moved from lisinopril 5mg  QD to lisinopril 2.5mg  prn > 140/90. Discussed standard of care to be on ACE-I in setting of DM however with recent  symptoms, falls ( patient lives alone), daughter and I agreed that we are balancing safety. Patient and daughter agree to monitor blood pressure approx 3 times per week or when patient is symptomatic. Encouraged increased water intake.       Relevant Medications   lisinopril (ZESTRIL) 2.5 MG tablet     Musculoskeletal and Integument   Primary osteoarthritis of left hip    Chronic, stable. Discussed tylenol #3 at length today and do not feel contributed to falls at this time. Patient is taking as prescribed and we will monitor closely for adverse effects to ensure safety balanced with quality of life, pain control.         Other   Anemia   Relevant Medications   ferrous sulfate 325 (65 FE) MG EC tablet   Hyperlipidemia    LDL not at goal; declines increasing statin more than 3 times per week. Patient and daughter would like to repeat later in the year.       Relevant Medications   lisinopril (ZESTRIL) 2.5 MG tablet       I spent 20  min  discussing plan of care over the phone.

## 2019-07-14 NOTE — Assessment & Plan Note (Signed)
LDL not at goal; declines increasing statin more than 3 times per week. Patient and daughter would like to repeat later in the year.

## 2019-07-28 ENCOUNTER — Telehealth: Payer: Self-pay | Admitting: Family

## 2019-07-28 DIAGNOSIS — M1612 Unilateral primary osteoarthritis, left hip: Secondary | ICD-10-CM

## 2019-07-29 ENCOUNTER — Encounter: Payer: Self-pay | Admitting: Family

## 2019-07-29 NOTE — Telephone Encounter (Signed)
close

## 2019-07-31 ENCOUNTER — Other Ambulatory Visit: Payer: Self-pay | Admitting: Family

## 2019-07-31 ENCOUNTER — Other Ambulatory Visit: Payer: Self-pay

## 2019-07-31 MED ORDER — BARIATRIC ROLLATOR MISC: 0 refills | Status: DC

## 2019-07-31 NOTE — Telephone Encounter (Signed)
Order has been printed and faxed to [facility] at [phone]

## 2019-08-07 ENCOUNTER — Other Ambulatory Visit: Payer: Self-pay | Admitting: Family

## 2019-08-07 DIAGNOSIS — D649 Anemia, unspecified: Secondary | ICD-10-CM

## 2019-08-18 ENCOUNTER — Ambulatory Visit (INDEPENDENT_AMBULATORY_CARE_PROVIDER_SITE_OTHER): Payer: Medicare HMO

## 2019-08-18 VITALS — Ht 64.0 in | Wt 143.0 lb

## 2019-08-18 DIAGNOSIS — Z0000 Encounter for general adult medical examination without abnormal findings: Secondary | ICD-10-CM | POA: Diagnosis not present

## 2019-08-18 NOTE — Patient Instructions (Addendum)
Amy Calhoun , Thank you for taking time to come for your Medicare Wellness Visit. I appreciate your ongoing commitment to your health goals. Please review the following plan we discussed and let me know if I can assist you in the future.   These are the goals we discussed: Goals    . Maintain Healthy Lifestyle     Stay hydrated Exercise Consistent meal times, healthy choices       This is a list of the screening recommended for you and due dates:  Health Maintenance  Topic Date Due  . Complete foot exam   Never done  . Eye exam for diabetics  Never done  . Tetanus Vaccine  Never done  . Pneumonia vaccines (2 of 2 - PPSV23) 10/15/2018  . COVID-19 Vaccine (2 - Moderna 2-dose series) 06/22/2019  . Hemoglobin A1C  07/02/2019  . Flu Shot  08/16/2019  . DEXA scan (bone density measurement)  Completed    Immunizations Immunization History  Administered Date(s) Administered  . Moderna SARS-COVID-2 Vaccination 05/25/2019  . Pneumococcal Conjugate-13 10/14/2017    Keep all routine maintenance appointments.   Follow up 10/14/19 @ 1:30  Advanced directives: yes, on file  Conditions/risks identified: none new.   Follow up in one year for your annual wellness visit.    Preventive Care 23 Years and Older, Female Preventive care refers to lifestyle choices and visits with your health care provider that can promote health and wellness. What does preventive care include?  A yearly physical exam. This is also called an annual well check.  Dental exams once or twice a year.  Routine eye exams. Ask your health care provider how often you should have your eyes checked.  Personal lifestyle choices, including:  Daily care of your teeth and gums.  Regular physical activity.  Eating a healthy diet.  Avoiding tobacco and drug use.  Limiting alcohol use.  Practicing safe sex.  Taking low-dose aspirin every day.  Taking vitamin and mineral supplements as recommended by your  health care provider. What happens during an annual well check? The services and screenings done by your health care provider during your annual well check will depend on your age, overall health, lifestyle risk factors, and family history of disease. Counseling  Your health care provider may ask you questions about your:  Alcohol use.  Tobacco use.  Drug use.  Emotional well-being.  Home and relationship well-being.  Sexual activity.  Eating habits.  History of falls.  Memory and ability to understand (cognition).  Work and work Astronomer.  Reproductive health. Screening  You may have the following tests or measurements:  Height, weight, and BMI.  Blood pressure.  Lipid and cholesterol levels. These may be checked every 5 years, or more frequently if you are over 11 years old.  Skin check.  Lung cancer screening. You may have this screening every year starting at age 43 if you have a 30-pack-year history of smoking and currently smoke or have quit within the past 15 years.  Fecal occult blood test (FOBT) of the stool. You may have this test every year starting at age 32.  Flexible sigmoidoscopy or colonoscopy. You may have a sigmoidoscopy every 5 years or a colonoscopy every 10 years starting at age 2.  Hepatitis C blood test.  Hepatitis B blood test.  Sexually transmitted disease (STD) testing.  Diabetes screening. This is done by checking your blood sugar (glucose) after you have not eaten for a while (fasting). You may  have this done every 1-3 years.  Bone density scan. This is done to screen for osteoporosis. You may have this done starting at age 109.  Mammogram. This may be done every 1-2 years. Talk to your health care provider about how often you should have regular mammograms. Talk with your health care provider about your test results, treatment options, and if necessary, the need for more tests. Vaccines  Your health care provider may recommend  certain vaccines, such as:  Influenza vaccine. This is recommended every year.  Tetanus, diphtheria, and acellular pertussis (Tdap, Td) vaccine. You may need a Td booster every 10 years.  Zoster vaccine. You may need this after age 46.  Pneumococcal 13-valent conjugate (PCV13) vaccine. One dose is recommended after age 12.  Pneumococcal polysaccharide (PPSV23) vaccine. One dose is recommended after age 52. Talk to your health care provider about which screenings and vaccines you need and how often you need them. This information is not intended to replace advice given to you by your health care provider. Make sure you discuss any questions you have with your health care provider. Document Released: 01/28/2015 Document Revised: 09/21/2015 Document Reviewed: 11/02/2014 Elsevier Interactive Patient Education  2017 [employer].  Fall Prevention in the Home Falls can cause injuries. They can happen to people of all ages. There are many things you can do to make your home safe and to help prevent falls. What can I do on the outside of my home?  Regularly fix the edges of walkways and driveways and fix any cracks.  Remove anything that might make you trip as you walk through a door, such as a raised step or threshold.  Trim any bushes or trees on the path to your home.  Use bright outdoor lighting.  Clear any walking paths of anything that might make someone trip, such as rocks or tools.  Regularly check to see if handrails are loose or broken. Make sure that both sides of any steps have handrails.  Any raised decks and porches should have guardrails on the edges.  Have any leaves, snow, or ice cleared regularly.  Use sand or salt on walking paths during winter.  Clean up any spills in your garage right away. This includes oil or grease spills. What can I do in the bathroom?  Use night lights.  Install grab bars by the toilet and in the tub and shower. Do not use towel bars as  grab bars.  Use non-skid mats or decals in the tub or shower.  If you need to sit down in the shower, use a plastic, non-slip stool.  Keep the floor dry. Clean up any water that spills on the floor as soon as it happens.  Remove soap buildup in the tub or shower regularly.  Attach bath mats securely with double-sided non-slip rug tape.  Do not have throw rugs and other things on the floor that can make you trip. What can I do in the bedroom?  Use night lights.  Make sure that you have a light by your bed that is easy to reach.  Do not use any sheets or blankets that are too big for your bed. They should not hang down onto the floor.  Have a firm chair that has side arms. You can use this for support while you get dressed.  Do not have throw rugs and other things on the floor that can make you trip. What can I do in the kitchen?  Clean up  any spills right away.  Avoid walking on wet floors.  Keep items that you use a lot in easy-to-reach places.  If you need to reach something above you, use a strong step stool that has a grab bar.  Keep electrical cords out of the way.  Do not use floor polish or wax that makes floors slippery. If you must use wax, use non-skid floor wax.  Do not have throw rugs and other things on the floor that can make you trip. What can I do with my stairs?  Do not leave any items on the stairs.  Make sure that there are handrails on both sides of the stairs and use them. Fix handrails that are broken or loose. Make sure that handrails are as long as the stairways.  Check any carpeting to make sure that it is firmly attached to the stairs. Fix any carpet that is loose or worn.  Avoid having throw rugs at the top or bottom of the stairs. If you do have throw rugs, attach them to the floor with carpet tape.  Make sure that you have a light switch at the top of the stairs and the bottom of the stairs. If you do not have them, ask someone to add them  for you. What else can I do to help prevent falls?  Wear shoes that:  Do not have high heels.  Have rubber bottoms.  Are comfortable and fit you well.  Are closed at the toe. Do not wear sandals.  If you use a stepladder:  Make sure that it is fully opened. Do not climb a closed stepladder.  Make sure that both sides of the stepladder are locked into place.  Ask someone to hold it for you, if possible.  Clearly mark and make sure that you can see:  Any grab bars or handrails.  First and last steps.  Where the edge of each step is.  Use tools that help you move around (mobility aids) if they are needed. These include:  Canes.  Walkers.  Scooters.  Crutches.  Turn on the lights when you go into a dark area. Replace any light bulbs as soon as they burn out.  Set up your furniture so you have a clear path. Avoid moving your furniture around.  If any of your floors are uneven, fix them.  If there are any pets around you, be aware of where they are.  Review your medicines with your doctor. Some medicines can make you feel dizzy. This can increase your chance of falling. Ask your doctor what other things that you can do to help prevent falls. This information is not intended to replace advice given to you by your health care provider. Make sure you discuss any questions you have with your health care provider. Document Released: 10/28/2008 Document Revised: 06/09/2015 Document Reviewed: 02/05/2014 Elsevier Interactive Patient Education  2017 [employer].

## 2019-08-18 NOTE — Progress Notes (Addendum)
Subjective:   Amy Calhoun is a 86 y.o. female who presents for Medicare Annual (Subsequent) preventive examination.  Review of Systems    No ROS.  Medicare Wellness Virtual Visit.  Cardiac Risk Factors include: advanced age (>11men, >14 women);hypertension;diabetes mellitus     Objective:    Today's Vitals   08/18/19 1334  Weight: 143 lb (64.9 kg)  Height: 5\' 4"  (1.626 m)   Body mass index is 24.55 kg/m.  Advanced Directives 08/18/2019 01/29/2019 01/28/2019 04/02/2018 03/06/2017 08/07/2016 07/12/2016  Does Patient Have a Medical Advance Directive? Yes Yes Yes Yes Yes Yes No  Type of 07/14/2016 of [place];Living will Healthcare Power of [place];Living will Living will Healthcare Power of [place];Living will Healthcare Power of [place];Living will Healthcare Power of [place];Living will -  Does patient want to make changes to medical advance directive? No - Patient declined No - Patient declined - No - Patient declined No - Patient declined No - Patient declined -  Copy of Healthcare Power of Attorney in Chart? Yes - validated most recent copy scanned in chart (See row information) No - copy requested - Yes - validated most recent copy scanned in chart (See row information) Yes - -  Would patient like information on creating a medical advance directive? - - - - - - No - Patient declined    Current Medications (verified) Outpatient Encounter Medications as of 08/18/2019  Medication Sig  . acetaminophen-codeine (TYLENOL #3) 300-30 MG tablet Take 1 tablet by mouth every 12 (twelve) hours as needed for moderate pain.  10/18/2019 albuterol (PROVENTIL HFA;VENTOLIN HFA) 108 (90 Base) MCG/ACT inhaler Inhale 2 puffs into the lungs every 6 (six) hours as needed for wheezing or shortness of breath.  Marland Kitchen amitriptyline (ELAVIL) 75 MG tablet Take 2 tablets (150 mg total) by mouth at bedtime.  . BD INSULIN SYRINGE U/F 31G X 5/16" 1 ML MISC USE AS DIRECTED TO ADMINISTER INSULIN  .  Cholecalciferol (VITAMIN D) 2000 units tablet Take 2,000 Units by mouth daily.   . Coenzyme Q10 (CO Q 10) 100 MG CAPS Take 1 capsule by mouth daily.  . Continuous Blood Gluc Sensor (FREESTYLE LIBRE 14 DAY SENSOR) MISC 1 Package by Does not apply route every 14 (fourteen) days.  6/16 CRANBERRY PO Take 1 tablet by mouth every Monday, Wednesday, and Friday.  . cyanocobalamin (,VITAMIN B-12,) 1000 MCG/ML injection INJECT Sunday WEEKLY FOR 4 WEEKS THEN INJECT MONTHLY  . docusate sodium (COLACE) 100 MG capsule Take 1 capsule (100 mg total) by mouth 2 (two) times daily. (Patient taking differently: Take 100 mg by mouth every other day. )  . ferrous sulfate 325 (65 FE) MG EC tablet TAKE 1 TABLET (325 MG TOTAL) BY MOUTH 3 (THREE) TIMES DAILY WITH MEALS.  gabapentin (NEURONTIN) 100 MG capsule TAKE 2 CAPSULES THREE TIMES A DAY  . glucagon (GLUCAGON EMERGENCY) 1 MG injection Inject 1 mg into the vein once as needed. For hypoglycemic episodes. E11.42  . Insulin Glargine (LANTUS SOLOSTAR) 100 UNIT/ML Solostar Pen Inject 8 Units into the skin at bedtime.  . Insulin Pen Needle (BD PEN NEEDLE NANO U/F) 32G X 4 MM MISC 1 Device by Does not apply route daily.  . insulin regular (HUMULIN R) 100 units/mL injection Inject 0.06 mLs (6 Units total) into the skin 3 (three) times daily before meals.  . Insulin Syringe-Needle U-100 (BD INSULIN SYRINGE U/F) 31G X 5/16" 0.3 ML MISC Use four times a day.  . levothyroxine (SYNTHROID)  150 MCG tablet TAKE 1 TABLET DAILY  . lisinopril (ZESTRIL) 2.5 MG tablet Take 1 tablet (2.5 mg total) by mouth daily. Take for blood pressure greater than 140/90  . Misc. Devices (BARIATRIC ROLLATOR) MISC Use as needed  . omeprazole (PRILOSEC) 40 MG capsule TAKE 1 CAPSULE TWICE A DAY  . ondansetron (ZOFRAN ODT) 4 MG disintegrating tablet Take 1 tablet (4 mg total) by mouth every 8 (eight) hours as needed for nausea or vomiting.  . pyridOXINE (VITAMIN B-6) 100 MG tablet Take 100 mg by mouth daily.    . riboflavin (VITAMIN B-2) 100 MG TABS tablet Take 100 mg by mouth daily.  . simvastatin (ZOCOR) 40 MG tablet TAKE 1 TABLET AT BEDTIME (Patient taking differently: Take 40 mg by mouth 3 (three) times a week. (at bedtime))  . Turmeric 500 MG TABS Take 750 mg by mouth 2 (two) times daily.   No facility-administered encounter medications on file as of 08/18/2019.    Allergies (verified) Pravachol [pravastatin]   History: Past Medical History:  Diagnosis Date  . Chronic kidney disease 08/07/2016   Chronic renal failure, stage III  . Depression   . Esophageal dysphagia   . Gastric ulcer   . GERD (gastroesophageal reflux disease)   . Hyperlipidemia   . Hypertension   . Hypothyroidism   . IDDM (insulin dependent diabetes mellitus)   . Ischemic colitis (HCC)   . Osteoarthritis   . Ulcer of esophagus    Past Surgical History:  Procedure Laterality Date  . ABDOMINAL HYSTERECTOMY    . COLONOSCOPY  2010  . ESOPHAGEAL DILATION  08/07/2016   usually a couple times a year, last time 07/30/16  . GALLBLADDER SURGERY    . LUMBAR LAMINECTOMY/DECOMPRESSION MICRODISCECTOMY N/A 08/10/2016   Procedure: BILATERAL HEMILAMINECTOMY LUMBAR THREE-FOUR,LUMBAR FOUR-FIVE,LEFT LUMBAR FIVE-SACRAL ONE HEMILAMINECTOMY AND DECOMPRESSION;  Surgeon: Coletta Memos, MD;  Location: [facility] OR;  Service: Neurosurgery;  Laterality: N/A;  . THYROIDECTOMY     Family History  Problem Relation Age of Onset  . Diabetes Mother   . Arthritis Mother   . Hyperlipidemia Mother   . Mental illness Mother   . Heart disease Father   . Mental illness Sister   . Arthritis Maternal Grandmother   . Arthritis Maternal Grandfather   . Colon cancer Neg Hx   . Stomach cancer Neg Hx   . Esophageal cancer Neg Hx    Social History   Socioeconomic History  . Marital status: Widowed    Spouse name: Not on file  . Number of children: Not on file  . Years of education: Not on file  . Highest education level: Not on file  Occupational  History  . Occupation: retired  Tobacco Use  . Smoking status: Never Smoker  . Smokeless tobacco: Never Used  Vaping Use  . Vaping Use: Never used  Substance and Sexual Activity  . Alcohol use: No  . Drug use: No  . Sexual activity: Never    Partners: Male  Other Topics Concern  . Not on file  Social History Narrative   [facility] senior complex, independent living.    Provide her with cleaning, one meal per day.    Social Determinants of Health   Financial Resource Strain: Low Risk   . Difficulty of Paying Living Expenses: Not hard at all  Food Insecurity: No Food Insecurity  . Worried About Programme researcher, broadcasting/film/video in the Last Year: Never true  . Ran Out of Food in the Last Year:  Never true  Transportation Needs: No Transportation Needs  . Lack of Transportation (Medical): No  . Lack of Transportation (Non-Medical): No  Physical Activity:   . Days of Exercise per Week:   . Minutes of Exercise per Session:   Stress: No Stress Concern Present  . Feeling of Stress : Not at all  Social Connections:   . Frequency of Communication with Friends and Family:   . Frequency of Social Gatherings with Friends and Family:   . Attends Religious Services:   . Active Member of Clubs or Organizations:   . Attends Banker Meetings:   Marland Kitchen Marital Status:     Tobacco Counseling Counseling given: Not Answered   Clinical Intake:  Pre-visit preparation completed: Yes        Diabetes: Yes (Followed by pcp)  How often do you need to have someone help you when you read instructions, pamphlets, or other written materials from your doctor or pharmacy?: 1 - Never  Interpreter Needed?: No      Activities of Daily Living In your present state of health, do you have any difficulty performing the following activities: 08/18/2019 01/29/2019  Hearing? Y Y  Comment Hearing aids -  Vision? N N  Difficulty concentrating or making decisions? (No Data) N  Comment Dx memory disturbance  -  Walking or climbing stairs? Y Y  Comment Unsteady gait. Walker/cane in use. -  Dressing or bathing? N N  Doing errands, shopping? Y N  Comment She does not drive Chief Operating Officer and eating ? N -  Comment Staff assist with 1 meal -  Using the Toilet? N -  In the past six months, have you accidently leaked urine? N -  Do you have problems with loss of bowel control? N -  Managing your Medications? Y -  Comment Daughter assist -  Managing your Finances? Y -  Comment Daughter assist -  Housekeeping or managing your Housekeeping? Y -  Comment Staff assist -  Some recent data might be hidden    Patient Care Team: Allegra Grana, FNP as PCP - General (Family Medicine)  Indicate any recent Medical Services you may have received from other than [facility] providers in the past year (date may be approximate).     Assessment:   This is a routine wellness examination for Shylynn.  I connected with Aaliyha today by telephone and verified that I am speaking with the correct person using two identifiers. Location patient: home Location provider: work Persons participating in the virtual visit: patient, daughter, Engineer, civil (consulting).   I discussed the limitations, risks, security and privacy concerns of performing an evaluation and management service by telephone and the availability of in person appointments. The patient expressed understanding and verbally consented to this telephonic visit.    Interactive audio and video telecommunications were attempted between this provider and patient, however failed, due to patient having technical difficulties OR patient did not have access to video capability.  We continued and completed visit with audio only.  Some vital signs may be absent or patient reported.   Hearing/Vision screen  Hearing Screening   125Hz  250Hz  500Hz  1000Hz  2000Hz  3000Hz  4000Hz  6000Hz  8000Hz   Right ear:           Left ear:           Comments: Hearing aid, bilateral   Vision  Screening Comments: Wears corrective lenses Cataract extraction, bilateral Visual acuity not assessed, virtual visit.  They have seen their ophthalmologist  in the last 12 months.    Dietary issues and exercise activities discussed:  Healthy diet. Eating x3 daily. High protein milkshakes.  Good fluid intake.   Goals    . Maintain Healthy Lifestyle     Stay hydrated Exercise Consistent meal times, healthy choices      Depression Screen PHQ 2/9 Scores 08/18/2019 02/13/2019 08/21/2018 04/02/2018 03/06/2017 10/03/2016 01/19/2016  PHQ - 2 Score 0 0 0 0 0 0 1    Fall Risk Fall Risk  08/18/2019 07/14/2019 02/13/2019 09/04/2018 08/21/2018  Falls in the past year? - 1 0 1 1  Number falls in past yr: - 1 0 - 1  Injury with Fall? - 0 1 - 0  Risk Factor Category  - - - - -  Risk for fall due to : - - - - -  Risk for fall due to: Comment - - - - -  Follow up Falls evaluation completed Falls evaluation completed Falls evaluation completed Falls evaluation completed -   Handrails in use when climbing stairs? Yes  Home free of loose throw rugs in walkways, pet beds, electrical cords, etc? Yes  Adequate lighting in your home to reduce risk of falls? Yes   ASSISTIVE DEVICES UTILIZED TO PREVENT FALLS: Use of a cane, walker or w/c? Yes , cane/walker Grab bars in the bathroom? Yes  Shower chair or bench in shower? Yes  Elevated toilet seat or a handicapped toilet? Yes   TIMED UP AND GO:  Was the test performed? No . Virtual visit.   Cognitive Function: MMSE - Mini Mental State Exam 08/18/2019 03/27/2017  Not completed: Unable to complete -  Orientation to time - 5  Orientation to Place - 4  Registration - 3  Attention/ Calculation - 5  Recall - 1  Language- name 2 objects - 2  Language- repeat - 1  Language- follow 3 step command - 3  Language- read & follow direction - 1  Write a sentence - 1  Copy design - 1  Total score - 27     6CIT Screen 03/06/2017  What Year? 0 points  What month? 0  points  What time? 0 points  Count back from 20 0 points  Months in reverse 0 points    Immunizations Immunization History  Administered Date(s) Administered  . Moderna SARS-COVID-2 Vaccination 05/25/2019  . Pneumococcal Conjugate-13 10/14/2017    TDAP status: Due, Education has been provided regarding the importance of this vaccine. Advised may receive this vaccine at local pharmacy or Health Dept. Aware to provide a copy of the vaccination record if obtained from local pharmacy or Health Dept. Verbalized acceptance and understanding. Deferred.   Covid-19 vaccine status: Completed vaccines. Agrees to update immunization record next office visit.   Pneumococcal 23 vaccine- Advised may receive this vaccine at local pharmacy or Health Dept. Aware to provide a copy of the vaccination record if obtained from local pharmacy or Health Dept. Verbalized acceptance and understanding. Deferred.  Health Maintenance Health Maintenance  Topic Date Due  . FOOT EXAM  Never done  . OPHTHALMOLOGY EXAM  Never done  . TETANUS/TDAP  Never done  . PNA vac Low Risk Adult (2 of 2 - PPSV23) 10/15/2018  . COVID-19 Vaccine (2 - Moderna 2-dose series) 06/22/2019  . HEMOGLOBIN A1C  07/02/2019  . INFLUENZA VACCINE  08/16/2019  . DEXA SCAN  Completed   A1C- 01/01/19 (7.4)  Foot exam- denies changes. Followed by PMD.   Dental Screening: Recommended annual  dental exams for proper oral hygiene  Community Resource Referral / Chronic Care Management: CRR required this visit?  No   CCM required this visit?  No      Plan:   Keep all routine maintenance appointments.   Follow up 10/14/19 @ 1:30  I have personally reviewed and noted the following in the patient's chart:   . Medical and social history . Use of alcohol, tobacco or illicit drugs  . Current medications and supplements . Functional ability and status . Nutritional status . Physical activity . Advanced directives . List of other  physicians . Hospitalizations, surgeries, and ER visits in previous 12 months . Vitals . Screenings to include cognitive, depression, and falls . Referrals and appointments  In addition, I have reviewed and discussed with patient certain preventive protocols, quality metrics, and best practice recommendations. A written personalized care plan for preventive services as well as general preventive health recommendations were provided to patient via mychart.     Ashok PallOBrien-Blaney, Niana Martorana L, LPN   5/7/84698/03/2019  Agree with plan. Rennie PlowmanMargaret Arnett, NP

## 2019-08-26 ENCOUNTER — Telehealth: Payer: Self-pay | Admitting: Family

## 2019-08-26 ENCOUNTER — Other Ambulatory Visit: Payer: Self-pay | Admitting: Internal Medicine

## 2019-08-26 ENCOUNTER — Other Ambulatory Visit: Payer: Self-pay | Admitting: Family

## 2019-08-26 DIAGNOSIS — M1612 Unilateral primary osteoarthritis, left hip: Secondary | ICD-10-CM

## 2019-08-26 NOTE — Telephone Encounter (Signed)
Pt called in need refill on acetaminophen-codeine (TYLENOL #3) 300-30 MG tablet

## 2019-08-31 MED ORDER — ACETAMINOPHEN-CODEINE #3 300-30 MG PO TABS: ORAL_TABLET | ORAL | 0 refills | Status: DC

## 2019-08-31 NOTE — Telephone Encounter (Signed)
5 days sent to local pharmacy.

## 2019-08-31 NOTE — Telephone Encounter (Signed)
Pt needs 5-7 days of Tylenol #3 sent to [facility] since [facility] is delayed on sending her medication. [facility] said they sent it to [facility] but [facility] is showing she won't get it for another week. Pt is currently out of medication. They would like a call when this is sent.

## 2019-09-02 ENCOUNTER — Telehealth (INDEPENDENT_AMBULATORY_CARE_PROVIDER_SITE_OTHER): Payer: Medicare HMO | Admitting: Internal Medicine

## 2019-09-02 ENCOUNTER — Encounter: Payer: Self-pay | Admitting: Internal Medicine

## 2019-09-02 ENCOUNTER — Other Ambulatory Visit: Payer: Self-pay

## 2019-09-02 DIAGNOSIS — E1129 Type 2 diabetes mellitus with other diabetic kidney complication: Secondary | ICD-10-CM

## 2019-09-02 DIAGNOSIS — E1165 Type 2 diabetes mellitus with hyperglycemia: Secondary | ICD-10-CM

## 2019-09-02 DIAGNOSIS — IMO0002 Reserved for concepts with insufficient information to code with codable children: Secondary | ICD-10-CM

## 2019-09-02 MED ORDER — FREESTYLE LIBRE 14 DAY SENSOR MISC: 1.0000 | 3 refills | Status: DC

## 2019-09-02 NOTE — Progress Notes (Signed)
Virtual Visit via Video Note  I connected with Amy Calhoun on 09/02/19 at 3:40pm by a video enabled telemedicine application and verified that I am speaking with the correct person using two identifiers.   I discussed the limitations of evaluation and management by telemedicine and the availability of in person appointments. The patient expressed understanding and agreed to proceed.  -Location of the patient : Home  -Location of the provider : office -The names of all persons participating in the telemedicine service : Pt and myself and daughter Amy Calhoun               Name: Amy Calhoun  Age/ Sex: 86 y.o., female   MRN/ DOB: [id number], 08/25/1928     PCP: Allegra Grana, FNP   Reason for Endocrinology Evaluation: Type 2 Diabetes Mellitus  Initial Endocrine Consultative Visit: 04/15/2018    PATIENT IDENTIFIER: Amy Calhoun is a 86 y.o. female with a past medical history of T2DM, Dyslipidemia, Hypothyroidism and HTN . The patient has followed with Endocrinology clinic since 04/15/2018 for consultative assistance with management of her diabetes.   Patient used to be cared for by her other daughter in [place] but daughter had passed due to a motorcycle accident and Amy Calhoun brought mother to [facility] in 2017.   DIABETIC HISTORY:  Ms. Madia was diagnosed with DM at age 43. She has been on insulin since her diagnosis.She was briefly on glipizide but this was stopped in March, 2020. Pt did not recall any other oral glycemic agents. Her hemoglobin A1c has ranged from 6.5 % in 2018, peaking at 8.9%  In 2018.  Daughter would like to explore putting mother on glycemic agents, she is concerned about having MDI regimen as mother gets older and worried about future dexterity.   On her initial visit to our clinic, she was on Humulin-N BID, Humulin-R per SS and Tradjenta.  We stopped Tradjenta, started Trulicity in March, 2020, with reduction in insulin doses.   SUBJECTIVE:    During the last visit (01/30/2019): This was a virtual visit.  We adjusted MDI regimen    Today (09/02/2019): Ms. Gendron is here with daughter Amy Calhoun for a follow up on diabetes management. In review of her CGM download, her BG are optimal during the night but as the days starts she starts and continues with hyperglycemia .  Per daughter pt is having intermittent dysphagia and weight loss and tends to take her insulin after she makes sure she is able to eat.     HOME DIABETES REGIMEN:  Lantus 8 units daily  Humulin-R 6 units      CONTINUOUS GLUCOSE MONITORING RECORD INTERPRETATION    Dates of Recording: 7/2-7/15/2021  Sensor description:freestyle libre  Results statistics:   CGM use % of time 77  Average and SD 245/36.4  Time in range       24 %  % Time Above 180 25  % Time above 250 49  % Time Below target 2      Glycemic patterns summary: Bg's within goal overnight but hyperglycemia noted during the day   Hyperglycemic episodes  Postprandial   Hypoglycemic episodes occurred N/A  Overnight periods: stable     HISTORY:  Past Medical History:  Past Medical History:  Diagnosis Date  . Chronic kidney disease 08/07/2016   Chronic renal failure, stage III  . Depression   . Esophageal dysphagia   . Gastric ulcer   . GERD (gastroesophageal reflux disease)   .  Hyperlipidemia   . Hypertension   . Hypothyroidism   . IDDM (insulin dependent diabetes mellitus)   . Ischemic colitis (HCC)   . Osteoarthritis   . Ulcer of esophagus    Past Surgical History:  Past Surgical History:  Procedure Laterality Date  . ABDOMINAL HYSTERECTOMY    . COLONOSCOPY  2010  . ESOPHAGEAL DILATION  08/07/2016   usually a couple times a year, last time 07/30/16  . GALLBLADDER SURGERY    . LUMBAR LAMINECTOMY/DECOMPRESSION MICRODISCECTOMY N/A 08/10/2016   Procedure: BILATERAL HEMILAMINECTOMY LUMBAR THREE-FOUR,LUMBAR FOUR-FIVE,LEFT LUMBAR FIVE-SACRAL ONE HEMILAMINECTOMY AND DECOMPRESSION;   Surgeon: Coletta Memosabbell, Kyle, MD;  Location: [facility] OR;  Service: Neurosurgery;  Laterality: N/A;  . THYROIDECTOMY      Social History:  reports that she has never smoked. She has never used smokeless tobacco. She reports that she does not drink alcohol and does not use drugs. Family History:  Family History  Problem Relation Age of Onset  . Diabetes Mother   . Arthritis Mother   . Hyperlipidemia Mother   . Mental illness Mother   . Heart disease Father   . Mental illness Sister   . Arthritis Maternal Grandmother   . Arthritis Maternal Grandfather   . Colon cancer Neg Hx   . Stomach cancer Neg Hx   . Esophageal cancer Neg Hx      HOME MEDICATIONS: Allergies as of 09/02/2019      Reactions   Pravachol [pravastatin] Other (See Comments)   States she refused due to side effects      Medication List       Accurate as of September 02, 2019 12:43 PM. If you have any questions, ask your nurse or doctor.        acetaminophen-codeine 300-30 MG tablet Commonly known as: TYLENOL #3 TAKE 1 TABLET EVERY 12 HOURS AS NEEDED FOR MODERATE PAIN   albuterol 108 (90 Base) MCG/ACT inhaler Commonly known as: VENTOLIN HFA Inhale 2 puffs into the lungs every 6 (six) hours as needed for wheezing or shortness of breath.   amitriptyline 75 MG tablet Commonly known as: ELAVIL Take 2 tablets (150 mg total) by mouth at bedtime.   Bariatric Rollator Misc Use as needed   BD Insulin Syringe U/F 31G X 5/16" 1 ML Misc Generic drug: Insulin Syringe-Needle U-100 USE AS DIRECTED TO ADMINISTER INSULIN   Insulin Syringe-Needle U-100 31G X 5/16" 0.3 ML Misc Commonly known as: BD Insulin Syringe U/F USE FOUR TIMES A DAY   BD Pen Needle Nano U/F 32G X 4 MM Misc Generic drug: Insulin Pen Needle 1 Device by Does not apply route daily.   Co Q 10 100 MG Caps Take 1 capsule by mouth daily.   CRANBERRY PO Take 1 tablet by mouth every Monday, Wednesday, and Friday.   cyanocobalamin 1000 MCG/ML  injection Commonly known as: (VITAMIN B-12) INJECT 1ML WEEKLY FOR 4 WEEKS THEN INJECT 1ML MONTHLY   docusate sodium 100 MG capsule Commonly known as: COLACE Take 1 capsule (100 mg total) by mouth 2 (two) times daily. What changed: when to take this   ferrous sulfate 325 (65 FE) MG EC tablet TAKE 1 TABLET (325 MG TOTAL) BY MOUTH 3 (THREE) TIMES DAILY WITH MEALS.   FreeStyle Libre 14 Day Sensor Misc 1 Package by Does not apply route every 14 (fourteen) days.   gabapentin 100 MG capsule Commonly known as: NEURONTIN TAKE 2 CAPSULES THREE TIMES A DAY   glucagon 1 MG injection Inject 1 mg into  the vein once as needed. For hypoglycemic episodes. E11.42   insulin regular 100 units/mL injection Commonly known as: HumuLIN R Inject 0.06 mLs (6 Units total) into the skin 3 (three) times daily before meals.   Lantus SoloStar 100 UNIT/ML Solostar Pen Generic drug: insulin glargine Inject 8 Units into the skin at bedtime.   levothyroxine 150 MCG tablet Commonly known as: SYNTHROID TAKE 1 TABLET DAILY   lisinopril 2.5 MG tablet Commonly known as: ZESTRIL Take 1 tablet (2.5 mg total) by mouth daily. Take for blood pressure greater than 140/90   omeprazole 40 MG capsule Commonly known as: PRILOSEC TAKE 1 CAPSULE TWICE A DAY   ondansetron 4 MG disintegrating tablet Commonly known as: Zofran ODT Take 1 tablet (4 mg total) by mouth every 8 (eight) hours as needed for nausea or vomiting.   pyridOXINE 100 MG tablet Commonly known as: VITAMIN B-6 Take 100 mg by mouth daily.   riboflavin 100 MG Tabs tablet Commonly known as: VITAMIN B-2 Take 100 mg by mouth daily.   simvastatin 40 MG tablet Commonly known as: ZOCOR TAKE 1 TABLET AT BEDTIME What changed:   when to take this  additional instructions   Turmeric 500 MG Tabs Take 750 mg by mouth 2 (two) times daily.   Vitamin D 50 MCG (2000 UT) tablet Take 2,000 Units by mouth daily.        DATA REVIEWED:  Lab Results   Component Value Date   HGBA1C 7.4 (A) 01/01/2019   HGBA1C 9.0 (A) 10/07/2018   HGBA1C 7.9 (H) 04/04/2018   Lab Results  Component Value Date   MICROALBUR 0.8 01/02/2017   LDLCALC 82 06/04/2019   CREATININE 1.02 06/04/2019   Lab Results  Component Value Date   CHOL 161 06/04/2019   HDL 51.80 06/04/2019   LDLCALC 82 06/04/2019   TRIG 136.0 06/04/2019   CHOLHDL 3 06/04/2019         ASSESSMENT / PLAN / RECOMMENDATIONS:   1) Type 2 Diabetes Mellitus,Acceptable  control, With CKD III and neuropathic complications - Most recent A1c of 7.4 %. Goal A1c < 7.5 %.    - In her of her Bg's they are optimal overnight , but the pt has variable hyperglycemia during the day, historically she has fear of hypoglycemia and tends to hold prandial insulin after the meals, she also has been having dysphagia , hence she waits until she eats before she takes her prandial insulin - Regular insulin takes at least 30 minutes to start working , hence the post-prandial hyperglycemia as she takes it later. I have offered to switch to insulin analogue due to fast mechanism of action but they have declined due to high cost of insulin analogues.   - The goal at this time is to avoid HYPOGLYCEMIA at this advanced age but also avoid symptomatic severe hyperglycemia.  - Post-prandial BG today was 184 mg/dL after taking 6 units of regular insulin with lunch which is acceptable   MEDICATIONS:   Continue  Lantus 8 units daily  Continue Novolin-R 6 units    EDUCATION / INSTRUCTIONS:  BG monitoring instructions: Patient is instructed to check her blood sugars 4 times a day, before meals and bedtime.  Call [facility] if: BG persistently < 70  . I reviewed the Rule of 15 for the treatment of hypoglycemia in detail with the patient. Literature supplied.   2) Weight Loss/ Dysphagia :  - Pt / daughter urged to follow up with GI for  further evaluation of dysphagia - Pt has hx of esophageal  stenosis requiring dilatation.    F/U in 6 months  Signed electronically by: Lyndle Herrlich, MD  [facility]  [facility] [address] [place], [place] [address] Phone: [phone] FAX: [phone]   CC: Allegra Grana, FNP [address] [place] [address] Phone: [phone]  Fax: [phone]  Return to Endocrinology clinic as below: Future Appointments  Date Time Provider Department Center  09/02/2019  3:40 PM Orchid Glassberg, Konrad Dolores, MD [facility]  10/14/2019  1:30 PM Allegra Grana, FNP [facility]  08/18/2020  1:30 PM O'Brien-Blaney, Denisa L, LPN [facility]

## 2019-09-03 MED ORDER — FREESTYLE LIBRE 14 DAY SENSOR MISC: 1.0000 | 3 refills | Status: DC

## 2019-09-06 ENCOUNTER — Encounter: Payer: Self-pay | Admitting: Family

## 2019-09-07 ENCOUNTER — Encounter: Payer: Self-pay | Admitting: Internal Medicine

## 2019-09-07 ENCOUNTER — Telehealth: Payer: Self-pay | Admitting: Family

## 2019-09-07 DIAGNOSIS — M1612 Unilateral primary osteoarthritis, left hip: Secondary | ICD-10-CM

## 2019-09-07 NOTE — Telephone Encounter (Signed)
Patient called in  About medication acetaminophen-codeine (TYLENOL #3) 300-30 MG tablet [facility] mother is out of medication

## 2019-09-08 ENCOUNTER — Telehealth: Payer: Self-pay | Admitting: Internal Medicine

## 2019-09-08 MED ORDER — ACETAMINOPHEN-CODEINE #3 300-30 MG PO TABS: ORAL_TABLET | ORAL | 0 refills | Status: DC

## 2019-09-08 NOTE — Telephone Encounter (Signed)
This has been corrected.

## 2019-09-08 NOTE — Telephone Encounter (Signed)
Patients daughter called to advise that the paper work that was sent to [facility] included visit from 01/2019 and not the most recent visit notes from 09/02/2019.  Randa Evens is going to resend paper work to be completed and returned with the most recent office visit notes

## 2019-09-08 NOTE — Telephone Encounter (Signed)
Call Amy Calhoun [facility]ol [facility]; [facility] wil not [facility] again.   Please ensure that she has not gotten tylenol [facility] which was mailed on 09/05/19 per [place] drug database

## 2019-09-08 NOTE — Telephone Encounter (Signed)
Patient's daughter called & made aware. She will let us know if any issues picking up medication.

## 2019-09-09 ENCOUNTER — Encounter: Payer: Self-pay | Admitting: Family

## 2019-09-15 ENCOUNTER — Telehealth: Payer: Self-pay | Admitting: Gastroenterology

## 2019-09-15 NOTE — Telephone Encounter (Signed)
The pt is due for an appt and has been scheduled for 9/1 to discuss dysphagia.  I spoke with the pt's daughter and she has been advised of the appt.

## 2019-09-15 NOTE — Telephone Encounter (Signed)
Pt's daughter is requesting a call back from a nurse to discuss her esophagus problems, pt is not able to keep anything down.

## 2019-09-16 ENCOUNTER — Ambulatory Visit (INDEPENDENT_AMBULATORY_CARE_PROVIDER_SITE_OTHER): Payer: Medicare HMO

## 2019-09-16 ENCOUNTER — Ambulatory Visit (INDEPENDENT_AMBULATORY_CARE_PROVIDER_SITE_OTHER): Payer: Medicare HMO | Admitting: Gastroenterology

## 2019-09-16 ENCOUNTER — Encounter: Payer: Self-pay | Admitting: Gastroenterology

## 2019-09-16 VITALS — BP 110/60 | HR 68 | Ht 62.0 in | Wt 138.5 lb

## 2019-09-16 DIAGNOSIS — R1310 Dysphagia, unspecified: Secondary | ICD-10-CM | POA: Diagnosis not present

## 2019-09-16 DIAGNOSIS — Z1159 Encounter for screening for other viral diseases: Secondary | ICD-10-CM

## 2019-09-16 DIAGNOSIS — Z01818 Encounter for other preprocedural examination: Secondary | ICD-10-CM

## 2019-09-16 LAB — SARS CORONAVIRUS 2 (TAT 6-24 HRS): SARS Coronavirus 2: NEGATIVE

## 2019-09-16 NOTE — Progress Notes (Signed)
Review of pertinent gastrointestinal problems: 1. Dysphagia, benign GE junction stricture. Established care with [facility] 2017 after move from [place].  EGD 07/30/2016 with a benign-appearing esophageal stenosis dilated to 34mm, patient did well for 10 months, now with repeat symptoms.  Repeat EGD 08/2017 found focal stricture again at GE junction, dilated to 45mm. Also small HH.  EGD June 2020 same stricture was noted in the mid esophagus.  Passage of the adult gastroscope cause minor bleeding, dilation of the stenosis, in addition a TTS balloon dilator was used to dilate the stricture to 13 mm.  She was recommended to increase her omeprazole to twice daily dosing as well.     HPI: This is a very pleasant 86 year old woman who is hard of hearing.  She is with her daughter today.  I last saw her June 2020 the time of an upper endoscopy and also colonoscopy.  See the EGD report above.  She did get definite relief out of that dilation for several months and then intermittently her dysphagia returned.  For the past month or 2 the dysphagia has been daily.  It was very bad for her yesterday.  It does sound like she is intermittently having food obstruct.  Generally drinking liquids will help it go down.  She is only half vaccinated for Covid, she had an adverse reaction to the [facility] vaccine  ROS: complete GI ROS as described in HPI, all other review negative.  Constitutional:  No unintentional weight loss   Past Medical History:  Diagnosis Date  . Chronic kidney disease 08/07/2016   Chronic renal failure, stage III  . Depression   . Esophageal dysphagia   . Gastric ulcer   . GERD (gastroesophageal reflux disease)   . Hyperlipidemia   . Hypertension   . Hypothyroidism   . IDDM (insulin dependent diabetes mellitus)   . Ischemic colitis (HCC)   . Osteoarthritis   . Ulcer of esophagus     Past Surgical History:  Procedure Laterality Date  . ABDOMINAL HYSTERECTOMY    . COLONOSCOPY  2010   . ESOPHAGEAL DILATION  08/07/2016   usually a couple times a year, last time 07/30/16  . GALLBLADDER SURGERY    . LUMBAR LAMINECTOMY/DECOMPRESSION MICRODISCECTOMY N/A 08/10/2016   Procedure: BILATERAL HEMILAMINECTOMY LUMBAR THREE-FOUR,LUMBAR FOUR-FIVE,LEFT LUMBAR FIVE-SACRAL ONE HEMILAMINECTOMY AND DECOMPRESSION;  Surgeon: Coletta Memos, MD;  Location: [facility] OR;  Service: Neurosurgery;  Laterality: N/A;  . THYROIDECTOMY      Current Outpatient Medications  Medication Sig Dispense Refill  . acetaminophen-codeine (TYLENOL #3) 300-30 MG tablet TAKE 1 TABLET EVERY 12 HOURS AS NEEDED FOR MODERATE PAIN 60 tablet 0  . albuterol (PROVENTIL HFA;VENTOLIN HFA) 108 (90 Base) MCG/ACT inhaler Inhale 2 puffs into the lungs every 6 (six) hours as needed for wheezing or shortness of breath. 1 Inhaler 2  . amitriptyline (ELAVIL) 75 MG tablet Take 2 tablets (150 mg total) by mouth at bedtime. 180 tablet 4  . BD INSULIN SYRINGE U/F 31G X 5/16" 1 ML MISC USE AS DIRECTED TO ADMINISTER INSULIN 100 each 5  . Cholecalciferol (VITAMIN D) 2000 units tablet Take 2,000 Units by mouth daily.     . Continuous Blood Gluc Sensor (FREESTYLE LIBRE 14 DAY SENSOR) MISC 1 Package by Does not apply route every 14 (fourteen) days. 6 each 3  . CRANBERRY PO Take 1 tablet by mouth every Monday, Wednesday, and Friday.    . cyanocobalamin (,VITAMIN B-12,) 1000 MCG/ML injection INJECT WEEKLY FOR 4 WEEKS THEN INJECT  MONTHLY (Patient taking differently: MONTHLY) 3 mL 5  . docusate sodium (COLACE) 100 MG capsule Take 1 capsule (100 mg total) by mouth 2 (two) times daily. (Patient taking differently: Take 100 mg by mouth every other day. ) 10 capsule 0  . ferrous sulfate 325 (65 FE) MG EC tablet TAKE 1 TABLET (325 MG TOTAL) BY MOUTH 3 (THREE) TIMES DAILY WITH MEALS. (Patient taking differently: every other day. ) 90 tablet 1  . gabapentin (NEURONTIN) 100 MG capsule TAKE 2 CAPSULES THREE TIMES A DAY 180 capsule 11  . glucagon (GLUCAGON  EMERGENCY) 1 MG injection Inject 1 mg into the vein once as needed. For hypoglycemic episodes. E11.42 1 each 3  . Insulin Glargine (LANTUS SOLOSTAR) 100 UNIT/ML Solostar Pen Inject 8 Units into the skin at bedtime. 15 mL 11  . Insulin Pen Needle (BD PEN NEEDLE NANO U/F) 32G X 4 MM MISC 1 Device by Does not apply route daily. 150 each 3  . insulin regular (HUMULIN R) 100 units/mL injection Inject 0.06 mLs (6 Units total) into the skin 3 (three) times daily before meals. 30 mL 3  . Insulin Syringe-Needle U-100 (BD INSULIN SYRINGE U/F) 31G X 5/16" 0.3 ML MISC USE FOUR TIMES A DAY 360 each 3  . levothyroxine (SYNTHROID) 150 MCG tablet TAKE 1 TABLET DAILY 90 tablet 3  . lisinopril (ZESTRIL) 2.5 MG tablet Take 1 tablet (2.5 mg total) by mouth daily. Take for blood pressure greater than 140/90 90 tablet 1  . Misc. Devices (BARIATRIC ROLLATOR) MISC Use as needed 1 each 0  . omeprazole (PRILOSEC) 40 MG capsule TAKE 1 CAPSULE TWICE A DAY 180 capsule 3  . ondansetron (ZOFRAN ODT) 4 MG disintegrating tablet Take 1 tablet (4 mg total) by mouth every 8 (eight) hours as needed for nausea or vomiting. 30 tablet 0  . pyridOXINE (VITAMIN B-6) 100 MG tablet Take 100 mg by mouth daily.    . riboflavin (VITAMIN B-2) 100 MG TABS tablet Take 100 mg by mouth daily.    . simvastatin (ZOCOR) 40 MG tablet TAKE 1 TABLET AT BEDTIME (Patient taking differently: Take 40 mg by mouth 3 (three) times a week. (at bedtime)) 90 tablet 3  . Turmeric 500 MG TABS Take 750 mg by mouth 2 (two) times daily.     No current facility-administered medications for this visit.    Allergies as of 09/16/2019 - Review Complete 09/16/2019  Allergen Reaction Noted  . Pravachol [pravastatin] Other (See Comments) 12/21/2015    Family History  Problem Relation Age of Onset  . Diabetes Mother   . Arthritis Mother   . Hyperlipidemia Mother   . Mental illness Mother   . Heart disease Father   . Mental illness Sister   . Arthritis Maternal  Grandmother   . Arthritis Maternal Grandfather   . Colon cancer Neg Hx   . Stomach cancer Neg Hx   . Esophageal cancer Neg Hx     Social History   Socioeconomic History  . Marital status: Widowed    Spouse name: Not on file  . Number of children: Not on file  . Years of education: Not on file  . Highest education level: Not on file  Occupational History  . Occupation: retired  Tobacco Use  . Smoking status: Never Smoker  . Smokeless tobacco: Never Used  Vaping Use  . Vaping Use: Never used  Substance and Sexual Activity  . Alcohol use: No  . Drug use: No  .  Sexual activity: Never    Partners: Male  Other Topics Concern  . Not on file  Social History Narrative   [facility] senior complex, independent living.    Provide her with cleaning, one meal per day.    Social Determinants of Health   Financial Resource Strain: Low Risk   . Difficulty of Paying Living Expenses: Not hard at all  Food Insecurity: No Food Insecurity  . Worried About Programme researcher, broadcasting/film/video in the Last Year: Never true  . Ran Out of Food in the Last Year: Never true  Transportation Needs: No Transportation Needs  . Lack of Transportation (Medical): No  . Lack of Transportation (Non-Medical): No  Physical Activity:   . Days of Exercise per Week: Not on file  . Minutes of Exercise per Session: Not on file  Stress: No Stress Concern Present  . Feeling of Stress : Not at all  Social Connections:   . Frequency of Communication with Friends and Family: Not on file  . Frequency of Social Gatherings with Friends and Family: Not on file  . Attends Religious Services: Not on file  . Active Member of Clubs or Organizations: Not on file  . Attends Banker Meetings: Not on file  . Marital Status: Not on file  Intimate Partner Violence:   . Fear of Current or Ex-Partner: Not on file  . Emotionally Abused: Not on file  . Physically Abused: Not on file  . Sexually Abused: Not on file      Physical Exam: Ht 5\' 2"  (1.575 m)   Wt 138 lb 8 oz (62.8 kg)   BMI 25.33 kg/m  Constitutional: Frail and elderly Psychiatric: alert and oriented x3 Abdomen: soft, nontender, nondistended, no obvious ascites, no peritoneal signs, normal bowel sounds No peripheral edema noted in lower extremities  Assessment and plan: 86 y.o. female with mid esophageal stricture  She is having significant difficulty with her stricture recently.  She needs repeat EGD and dilation.  She is only half vaccinated.  She will require testing if she is to be done in our ambulatory surgical center.  She will require testing if she is done at the hospital.  We do not have enough time to get her tested with the results back for her to have the procedure done tomorrow.  This is a holiday weekend.  She and I and her daughter discussed this in detail.  She is going to drink liquids, soft foods only, nutritious liquids.  I will proceed with EGD with dilation early Tuesday morning 730 in our ambulatory surgical center and we will have her tested appropriately.  Please see the "Patient Instructions" section for addition details about the plan.  Wednesday, MD [facility] 09/16/2019, 2:02 PM   Total time on date of encounter was 30 minutes (this included time spent preparing to see the patient reviewing records; obtaining and/or reviewing separately obtained history; performing a medically appropriate exam and/or evaluation; counseling and educating the patient and family if present; ordering medications, tests or procedures if applicable; and documenting clinical information in the health record).

## 2019-09-16 NOTE — Patient Instructions (Signed)
If you are age 84 or older, your body mass index should be between 23-30. Your Body mass index is 25.33 kg/m. If this is out of the aforementioned range listed, please consider follow up with your Primary Care Provider.  If you are age 42 or younger, your body mass index should be between 19-25. Your Body mass index is 25.33 kg/m. If this is out of the aformentioned range listed, please consider follow up with your Primary Care Provider.   You have been scheduled for an endoscopy. Please follow written instructions given to you at your visit today. If you use inhalers (even only as needed), please bring them with you on the day of your procedure.  Due to recent changes in healthcare laws, you may see the results of your imaging and laboratory studies on MyChart before your provider has had a chance to review them.  We understand that in some cases there may be results that are confusing or concerning to you. Not all laboratory results come back in the same time frame and the provider may be waiting for multiple results in order to interpret others.  Please give [place] 48 hours in order for your provider to thoroughly review all the results before contacting the office for clarification of your results.   Thank you for entrusting me with your care and choosing [facility].  Dr Christella Hartigan

## 2019-09-22 ENCOUNTER — Other Ambulatory Visit: Payer: Self-pay

## 2019-09-22 ENCOUNTER — Ambulatory Visit (AMBULATORY_SURGERY_CENTER): Payer: Medicare HMO | Admitting: Gastroenterology

## 2019-09-22 ENCOUNTER — Encounter: Payer: Self-pay | Admitting: Gastroenterology

## 2019-09-22 VITALS — BP 176/77 | HR 76 | Temp 97.3°F | Resp 14 | Ht 62.0 in | Wt 138.0 lb

## 2019-09-22 DIAGNOSIS — R1310 Dysphagia, unspecified: Secondary | ICD-10-CM

## 2019-09-22 DIAGNOSIS — K222 Esophageal obstruction: Secondary | ICD-10-CM

## 2019-09-22 MED ORDER — SODIUM CHLORIDE 0.9 % IV SOLN: 500.0000 mL | Freq: Once | INTRAVENOUS | Status: DC

## 2019-09-22 NOTE — Progress Notes (Signed)
JB - Check-in SB - VS  Pt is very hard of hearing and has a hearing aid in left ear only.  Pt still having a hard time hearing me.  I Asked pt's granddaughter back to help with questions.  MAW

## 2019-09-22 NOTE — Op Note (Signed)
[facility] Patient Name: Amy Calhoun Procedure Date: 09/22/2019 7:01 AM MRN: [id number] Endoscopist: Rachael Fee , MD Age: 86 Referring MD:  Date of Birth: Jun 12, 1928 Gender: Female Account #: [id number] Procedure:                Upper GI endoscopy Indications:              Dysphagia, benign GE junction                            stricture.Established care with [facility] 2017                            after move from [place]. EGD 07/30/2016 with a                            benign-appearing esophageal stenosis dilated to                            44mm, patient did well for 10 months, now with                            repeat symptoms. Repeat EGD 08/2017 found focal                            stricture again at GE junction, dilated to 18mm.                            Also small HH. EGD June 2020 same stricture was                            noted in the mid esophagus. Passage of the adult                            gastroscope cause minor bleeding, dilation of the                            stenosis, in addition a TTS balloon dilator was                            used to dilate the stricture to 13 mm. She was                            recommended to increase her omeprazole to twice                            daily dosing as well. Medicines:                Monitored Anesthesia Care Procedure:                Pre-Anesthesia Assessment:                           - Prior to the procedure, a History and Physical  was performed, and patient medications and                            allergies were reviewed. The patient's tolerance of                            previous anesthesia was also reviewed. The risks                            and benefits of the procedure and the sedation                            options and risks were discussed with the patient.                            All questions were answered, and informed consent                             was obtained. Prior Anticoagulants: The patient has                            taken no previous anticoagulant or antiplatelet                            agents. ASA Grade Assessment: III - A patient with                            severe systemic disease. After reviewing the risks                            and benefits, the patient was deemed in                            satisfactory condition to undergo the procedure.                           After obtaining informed consent, the endoscope was                            passed under direct vision. Throughout the                            procedure, the patient's blood pressure, pulse, and                            oxygen saturations were monitored continuously. The                            Endoscope was introduced through the mouth, and                            advanced to the second part of duodenum. The upper  GI endoscopy was accomplished without difficulty.                            The patient tolerated the procedure well. Scope In: Scope Out: Findings:                 One benign-appearing, intrinsic mild stenosis was                            found at the gastroesophageal junction (focal                            peptic stricture vs. thick Schatzki's ring). The                            stenosis was traversed. A TTS dilator was passed                            through the scope. Dilation with an 18-19-20 mm                            balloon dilator was performed to 19 mm.                           The exam was otherwise without abnormality. Complications:            No immediate complications. Estimated blood loss:                            None. Estimated Blood Loss:     Estimated blood loss: none. Impression:               - Benign-appearing esophageal stenosis (focal                            peptic stricture vs. thick Schatzki's ring),                             dilated to 30mm with TTS balloon                           - The examination was otherwise normal.                           - No specimens collected. Recommendation:           - Patient has a contact number available for                            emergencies. The signs and symptoms of potential                            delayed complications were discussed with the                            patient. Return to normal activities tomorrow.  Written discharge instructions were provided to the                            patient.                           - Resume previous diet. Only eat with good fitting                            dentures in place. Eat slowly, take small bites and                            chew your food very well.                           - Continue present medications. Twice daily                            omeprazole.                           - Call Dr. Christella Hartigan' office in 4-5 weeks to report on                            your response. Rachael Fee, MD 09/22/2019 7:54:48 AM This report has been signed electronically.

## 2019-09-22 NOTE — Progress Notes (Signed)
Called to room to assist during endoscopic procedure.  Patient ID and intended procedure confirmed with present staff. Received instructions for my participation in the procedure from the performing physician.  

## 2019-09-22 NOTE — Progress Notes (Signed)
pt tolerated well. VSS. awake and to recovery. Report given to RN. Bite block inserted while awake no complaint and not trauma. Block eemoved gently no trauma.

## 2019-09-22 NOTE — Patient Instructions (Signed)
Please read handouts provided. Continue present medications. Twice daily omeprazole. Call Dr. Christella Hartigan' office in 4-5 weeks to report your response. Only eat with good fitting dentures in place. Eat slowly and chew food well.      YOU HAD AN ENDOSCOPIC PROCEDURE TODAY AT THE [facility]:   Refer to the procedure report that was given to you for any specific questions about what was found during the examination.  If the procedure report does not answer your questions, please call your gastroenterologist to clarify.  If you requested that your care partner not be given the details of your procedure findings, then the procedure report has been included in a sealed envelope for you to review at your convenience later.  YOU SHOULD EXPECT: Some feelings of bloating in the abdomen. Passage of more gas than usual.  Walking can help get rid of the air that was put into your GI tract during the procedure and reduce the bloating. If you had a lower endoscopy (such as a colonoscopy or flexible sigmoidoscopy) you may notice spotting of blood in your stool or on the toilet paper. If you underwent a bowel prep for your procedure, you may not have a normal bowel movement for a few days.  Please Note:  You might notice some irritation and congestion in your nose or some drainage.  This is from the oxygen used during your procedure.  There is no need for concern and it should clear up in a day or so.  SYMPTOMS TO REPORT IMMEDIATELY:     Following upper endoscopy (EGD)  Vomiting of blood or coffee ground material  New chest pain or pain under the shoulder blades  Painful or persistently difficult swallowing  New shortness of breath  Fever of 100F or higher  Black, tarry-looking stools  For urgent or emergent issues, a gastroenterologist can be reached at any hour by calling [phone]. Do not use MyChart messaging for urgent concerns.    DIET:  We do recommend a small meal at first, but  then you may proceed to your regular diet.  Drink plenty of fluids but you should avoid alcoholic beverages for 24 hours.  ACTIVITY:  You should plan to take it easy for the rest of today and you should NOT DRIVE or use heavy machinery until tomorrow (because of the sedation medicines used during the test).    FOLLOW UP: Our staff will call the number listed on your records 48-72 hours following your procedure to check on you and address any questions or concerns that you may have regarding the information given to you following your procedure. If we do not reach you, we will leave a message.  We will attempt to reach you two times.  During this call, we will ask if you have developed any symptoms of COVID 19. If you develop any symptoms (ie: fever, flu-like symptoms, shortness of breath, cough etc.) before then, please call [phone].  If you test positive for Covid 19 in the 2 weeks post procedure, please call and report this information to [place].    If any biopsies were taken you will be contacted by phone or by letter within the next 1-3 weeks.  Please call us at [phone] if you have not heard about the biopsies in 3 weeks.    SIGNATURES/CONFIDENTIALITY: You and/or your care partner have signed paperwork which will be entered into your electronic medical record.  These signatures attest to the fact that that the information  above on your After Visit Summary has been reviewed and is understood.  Full responsibility of the confidentiality of this discharge information lies with you and/or your care-partner.

## 2019-09-24 ENCOUNTER — Telehealth: Payer: Self-pay | Admitting: *Deleted

## 2019-09-24 NOTE — Telephone Encounter (Signed)
°  Follow up Call-  Call back number 09/22/2019 06/27/2018 08/16/2017  Post procedure Call Back phone  # #[phone] [name] Pt's daughter [phone]  Permission to leave phone message Yes Yes Yes  Some recent data might be hidden     Patient questions:  Do you have a fever, pain , or abdominal swelling? No. Pain Score  0 *  Have you tolerated food without any problems? Yes.    Have you been able to return to your normal activities? Yes.    Do you have any questions about your discharge instructions: Diet   No. Medications  No. Follow up visit  No.  Do you have questions or concerns about your Care? No.  Actions: * If pain score is 4 or above: No action needed, pain <4.  1. Have you developed a fever since your procedure? no  2.   Have you had an respiratory symptoms (SOB or cough) since your procedure? no  3.   Have you tested positive for COVID 19 since your procedure no  4.   Have you had any family members/close contacts diagnosed with the COVID 19 since your procedure?  no   If yes to any of these questions please route to Laverna Peace, RN and Karlton Lemon, RN

## 2019-09-29 ENCOUNTER — Other Ambulatory Visit: Payer: Self-pay | Admitting: Family

## 2019-09-29 ENCOUNTER — Encounter: Payer: Self-pay | Admitting: Family

## 2019-09-29 DIAGNOSIS — M1612 Unilateral primary osteoarthritis, left hip: Secondary | ICD-10-CM

## 2019-09-29 NOTE — Telephone Encounter (Signed)
I spoke with patient's daughter [name] & let her know about prescription refill. [name] wanted to update [place]. She said that endocrinologist Dr. Boneta Lucks was concerned that patient was losing so much weight & wanted to address. [name] stated it could be just her mom's age, but even if something was found such as cancer that they would not most likely not treat. She said patient did have a fall last Saturday & hit her shoulder, but has been okay. She was able to partially catch herself. She stated that over all for 89 patient was doing very well. She has f/u 9/29 & she does have signed CSC in chart.

## 2019-09-29 NOTE — Telephone Encounter (Signed)
Call pt and dtr I Refilled tylenol 3, with do not fill unil 10/08/19 based on last refill  Please advise that we will need urine screen AND CSC signed at follow up this month so patient needs to come IN PERSON

## 2019-09-30 NOTE — Progress Notes (Signed)
Subjective:    Patient ID: Amy Calhoun, female    DOB: 01/29/1928, 86 y.o.   MRN: [id number]  CC: Amy Calhoun is a 86 y.o. female who presents today for follow up.   HPI: Accompanied by daughter, Amy Calhoun.   Falling more  Often and seems off balance.She is not on aspirin.  Yesterday fell up against daughter. H/o bilateral peripheral neuropathy.  No pain in calves when walking. No leg swelling. Chronic BL hearing loss which has worsened as well. Hearing aids arrive in a couple of days. She doesn't take ASA.   Walking to [employer] everyday.   Has done Home PT in the past. No longer swimming and stopped going as patient's daughter couldn't swim in cold water.  When swimming, daughter describes that her  'mom was stronger', she had fewer falls when swimming 3 days per week as well.    Low back pain , chronic- takes tyenolol #3 with relief. Not feeling groggy on this medication. Pain is not interfering with QOL.  Has gained 4 pounds since last visit with endocrine. Eating breakfast and lunch as biggest meals. Drinking Atkins shake one per day.    Depression- has been on amitriptyline since 1960's. Has tried to come off in the past and couldn't sleep and depression worsened as well. No h/o HA. She takes medication for depression.   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    HLD- taking simvastatin QOD  EKG 01/2019 QT 412 HISTORY:  Past Medical History:  Diagnosis Date  . Chronic kidney disease 08/07/2016   Chronic renal failure, stage III  . Depression   . Esophageal dysphagia   . Gastric ulcer   . GERD (gastroesophageal reflux disease)   . Hyperlipidemia   . Hypertension   . Hypothyroidism   . IDDM (insulin dependent diabetes mellitus)   . Ischemic colitis (HCC)   . Osteoarthritis   . Ulcer of esophagus    Past Surgical History:  Procedure Laterality Date  . ABDOMINAL HYSTERECTOMY    . COLONOSCOPY  2010  . ESOPHAGEAL DILATION  08/07/2016     usually a couple times a year, last time 07/30/16  . GALLBLADDER SURGERY    . LUMBAR LAMINECTOMY/DECOMPRESSION MICRODISCECTOMY N/A 08/10/2016   Procedure: BILATERAL HEMILAMINECTOMY LUMBAR THREE-FOUR,LUMBAR FOUR-FIVE,LEFT LUMBAR FIVE-SACRAL ONE HEMILAMINECTOMY AND DECOMPRESSION;  Surgeon: Coletta Memos, MD;  Location: [facility] OR;  Service: Neurosurgery;  Laterality: N/A;  . THYROIDECTOMY     Family History  Problem Relation Age of Onset  . Diabetes Mother   . Arthritis Mother   . Hyperlipidemia Mother   . Mental illness Mother   . Heart disease Father   . Mental illness Sister   . Arthritis Maternal Grandmother   . Arthritis Maternal Grandfather   . Colon cancer Neg Hx   . Stomach cancer Neg Hx   . Esophageal cancer Neg Hx   . Rectal cancer Neg Hx     Allergies: Pravachol [pravastatin] Current Outpatient Medications on File Prior to Visit  Medication Sig Dispense Refill  . Acetaminophen-Codeine 300-30 MG tablet TAKE 1 TABLET EVERY 12 HOURS AS NEEDED FOR MODERATE PAIN 60 tablet 0  . albuterol (PROVENTIL HFA;VENTOLIN HFA) 108 (90 Base) MCG/ACT inhaler Inhale 2 puffs into the lungs every 6 (six) hours as needed for wheezing or shortness of breath. 1 Inhaler 2  . amitriptyline (ELAVIL) 75 MG tablet Take 2 tablets (150 mg total) by mouth at bedtime. 180 tablet 4  . ascorbic  acid (VITAMIN C) 500 MG tablet Take 500 mg by mouth daily.    . BD INSULIN SYRINGE U/F 31G X 5/16" 1 ML MISC USE AS DIRECTED TO ADMINISTER INSULIN 100 each 5  . Cholecalciferol (VITAMIN D) 2000 units tablet Take 2,000 Units by mouth daily.     . Continuous Blood Gluc Sensor (FREESTYLE LIBRE 14 DAY SENSOR) MISC 1 Package by Does not apply route every 14 (fourteen) days. 6 each 3  . CRANBERRY PO Take 1 tablet by mouth every Monday, Wednesday, and Friday.    . cyanocobalamin (,VITAMIN B-12,) 1000 MCG/ML injection INJECT WEEKLY FOR 4 WEEKS THEN INJECT MONTHLY (Patient taking differently: MONTHLY) 3 mL 5  . docusate  sodium (COLACE) 100 MG capsule Take 1 capsule (100 mg total) by mouth 2 (two) times daily. (Patient taking differently: Take 100 mg by mouth every other day. ) 10 capsule 0  . ferrous sulfate 325 (65 FE) MG EC tablet TAKE 1 TABLET (325 MG TOTAL) BY MOUTH 3 (THREE) TIMES DAILY WITH MEALS. (Patient taking differently: Take 325 mg by mouth every other day. ) 90 tablet 1  . gabapentin (NEURONTIN) 100 MG capsule TAKE 2 CAPSULES THREE TIMES A DAY 180 capsule 11  . glucagon (GLUCAGON EMERGENCY) 1 MG injection Inject 1 mg into the vein once as needed. For hypoglycemic episodes. E11.42 1 each 3  . Insulin Glargine (LANTUS SOLOSTAR) 100 UNIT/ML Solostar Pen Inject 8 Units into the skin at bedtime. 15 mL 11  . insulin regular (HUMULIN R) 100 units/mL injection Inject 0.06 mLs (6 Units total) into the skin 3 (three) times daily before meals. 30 mL 3  . Insulin Syringe-Needle U-100 (BD INSULIN SYRINGE U/F) 31G X 5/16" 0.3 ML MISC USE FOUR TIMES A DAY 360 each 3  . levothyroxine (SYNTHROID) 150 MCG tablet TAKE 1 TABLET DAILY 90 tablet 3  . lisinopril (ZESTRIL) 2.5 MG tablet Take 1 tablet (2.5 mg total) by mouth daily. Take for blood pressure greater than 140/90 90 tablet 1  . Misc. Devices (BARIATRIC ROLLATOR) MISC Use as needed 1 each 0  . omeprazole (PRILOSEC) 40 MG capsule TAKE 1 CAPSULE TWICE A DAY 180 capsule 3  . ondansetron (ZOFRAN ODT) 4 MG disintegrating tablet Take 1 tablet (4 mg total) by mouth every 8 (eight) hours as needed for nausea or vomiting. 30 tablet 0  . pyridOXINE (VITAMIN B-6) 100 MG tablet Take 100 mg by mouth daily.    . riboflavin (VITAMIN B-2) 100 MG TABS tablet Take 100 mg by mouth daily.    . simvastatin (ZOCOR) 40 MG tablet TAKE 1 TABLET AT BEDTIME (Patient taking differently: Take 40 mg by mouth 3 (three) times a week. (at bedtime)) 90 tablet 3  . Turmeric 500 MG TABS Take 750 mg by mouth 2 (two) times daily.     No current facility-administered medications on file prior to visit.      Social History   Tobacco Use  . Smoking status: Never Smoker  . Smokeless tobacco: Never Used  Vaping Use  . Vaping Use: Never used  Substance Use Topics  . Alcohol use: No  . Drug use: No    Review of Systems    Objective:    BP 130/74 (BP Location: Left Arm, Cuff Size: Normal)   Pulse 66   Temp (!) 97.5 F (36.4 C)   Ht 5\' 2"  (1.575 Calhoun)   Wt 138 lb 6.4 oz (62.8 kg)   SpO2 96%   BMI  25.31 kg/Calhoun  BP Readings from Last 3 Encounters:  10/14/19 130/74  09/22/19 (!) 176/77  09/16/19 110/60   Wt Readings from Last 3 Encounters:  10/14/19 138 lb 6.4 oz (62.8 kg)  09/22/19 138 lb (62.6 kg)  09/16/19 138 lb 8 oz (62.8 kg)    Physical Exam     Assessment & Plan:   Problem List Items Addressed This Visit      Cardiovascular and Mediastinum   HTN (hypertension), malignant - Primary    Stable. Continue lisinopril 2.5mg       Relevant Orders   Comprehensive metabolic panel (Completed)   Urinalysis, Routine w reflex microscopic (Completed)     Nervous and Auditory   Weakness of both lower extremities    Concerned with falls, complicated by neuropathy, hard of hearing.Pending Home health evaluation as it pertains to safety and assessment as well as work with balance, strength with home exercises, walking. Will follow        Other   Chronic pain    Chronic, stable. Pain not interfering with QOL nor appears to be affecting walking, balance. Will continue tyleonol #3 and again discussed with patient concern for this contributing to balance concerns, falls. Patient and daughter do not feel that medication is causing grogginess and feel she is using appropriately which I agree. Will continue to prescribe and patient understands to follow up with me every 3 months as controlled substance. I continued to reiterate the importance of storing medication in safe location as well.       Relevant Orders   Ambulatory referral to Physical Therapy   Pain Management Screening  Profile (10S)   Depression    Controlled on elavil 75mg . I had discussed this medication with pharmacy Catie , and jointly agreed that this medication is high risk in the persons of her age. I counseled both patient and daughter that I would like to wean off and use a safer medication, such as trazodone or even considering of cymbalta due to chronic pain. QT interval normal range 01/2019. She been well controlled on this medication in regards to depression, and she also has neuropathy. We opted to continue medication as she has been on for 40years and very hesitent to change.       Hyperlipidemia    Stable, continue simvastatin 40mg  qd      Relevant Orders   Lipid panel (Completed)       I have discontinued Amy Calhoun's BD Pen Needle Nano U/F. I am also having her maintain her pyridOXINE, riboflavin, glucagon, docusate sodium, CRANBERRY PO, albuterol, Vitamin D, Turmeric, BD Insulin Syringe U/F, ondansetron, simvastatin, Lantus SoloStar, insulin regular, amitriptyline, levothyroxine, omeprazole, cyanocobalamin, gabapentin, lisinopril, Bariatric Rollator, ferrous sulfate, Insulin Syringe-Needle U-100, FreeStyle Libre 14 Day Sensor, Acetaminophen-Codeine, and ascorbic acid.   No orders of the defined types were placed in this encounter.   Return precautions given.   Risks, benefits, and alternatives of the medications and treatment plan prescribed today were discussed, and patient expressed understanding.   Education regarding symptom management and diagnosis given to patient on AVS.  Continue to follow with , FNP for routine health maintenance.   Amy Calhoun and I agreed with plan.   Amy Grana, FNP

## 2019-10-13 ENCOUNTER — Telehealth: Payer: Self-pay | Admitting: Gastroenterology

## 2019-10-13 NOTE — Telephone Encounter (Signed)
The stricture at EGD 1 month ago was not very tight at all. She needs to really be careful when she eats, soft foods only, chewing very well.  Offer hew my next available EGD at [facility] for repeat EGD, dilation.  Thanks

## 2019-10-13 NOTE — Telephone Encounter (Signed)
The pt had EGD on 9/7 and was advised to call with an update.  The pt daughter states that since the EGD she has had 3 episodes of food getting stuck.  The last incident this past Friday lasted about 3 hours and caused her to throw up mucous.  She is able to breathe during the episodes but nothing will go down. She tried warm liquids, applesauce, and water.  Eventually the food passed and no other problems since this past Friday.  She was advised to eat smaller amounts and only soft foods.  I will forward to Dr Christella Hartigan for further req's.

## 2019-10-13 NOTE — Telephone Encounter (Signed)
previsit scheduled for 10/8 at 10 am and 10/13 4 pm EGD at the [facility].with Dr Christella Hartigan.   The patient has been notified of this information and all questions answered.

## 2019-10-13 NOTE — Telephone Encounter (Signed)
Pt's daughter is wanting to inform the nurse that the pt is doing ok but she had an incident last Friday where something was stuck in her throat which lasted a few hours, throwing up phlegm. Caller would like to discuss further with the nurse.

## 2019-10-14 ENCOUNTER — Other Ambulatory Visit: Payer: Self-pay

## 2019-10-14 ENCOUNTER — Ambulatory Visit (INDEPENDENT_AMBULATORY_CARE_PROVIDER_SITE_OTHER): Payer: Medicare HMO | Admitting: Family

## 2019-10-14 ENCOUNTER — Encounter: Payer: Self-pay | Admitting: Family

## 2019-10-14 VITALS — BP 130/74 | HR 66 | Temp 97.5°F | Ht 62.0 in | Wt 138.4 lb

## 2019-10-14 DIAGNOSIS — E785 Hyperlipidemia, unspecified: Secondary | ICD-10-CM

## 2019-10-14 DIAGNOSIS — R29898 Other symptoms and signs involving the musculoskeletal system: Secondary | ICD-10-CM

## 2019-10-14 DIAGNOSIS — F329 Major depressive disorder, single episode, unspecified: Secondary | ICD-10-CM

## 2019-10-14 DIAGNOSIS — G8929 Other chronic pain: Secondary | ICD-10-CM

## 2019-10-14 DIAGNOSIS — F32A Depression, unspecified: Secondary | ICD-10-CM

## 2019-10-14 DIAGNOSIS — I10 Essential (primary) hypertension: Secondary | ICD-10-CM

## 2019-10-14 MED ORDER — BD PEN NEEDLE MICRO U/F 32G X 6 MM MISC: 3 refills | Status: DC

## 2019-10-14 NOTE — Patient Instructions (Signed)
Referral for physical therapy  Nice to see you!

## 2019-10-15 LAB — COMPREHENSIVE METABOLIC PANEL
ALT: 14 U/L (ref 0–35)
AST: 14 U/L (ref 0–37)
Albumin: 4.4 g/dL (ref 3.5–5.2)
Alkaline Phosphatase: 78 U/L (ref 39–117)
BUN: 21 mg/dL (ref 6–23)
CO2: 32 mEq/L (ref 19–32)
Calcium: 8.6 mg/dL (ref 8.4–10.5)
Chloride: 98 mEq/L (ref 96–112)
Creatinine, Ser: 1.15 mg/dL (ref 0.40–1.20)
GFR: 44.42 mL/min — ABNORMAL LOW (ref >60.00)
Glucose, Bld: 313 mg/dL — ABNORMAL HIGH (ref 70–99)
Potassium: 4.4 mEq/L (ref 3.5–5.1)
Sodium: 138 mEq/L (ref 135–145)
Total Bilirubin: 0.5 mg/dL (ref 0.2–1.2)
Total Protein: 6.8 g/dL (ref 6.0–8.3)

## 2019-10-15 LAB — LIPID PANEL
Cholesterol: 160 mg/dL (ref 0–200)
HDL: 64.1 mg/dL (ref >39.00)
LDL Cholesterol: 78 mg/dL (ref 0–99)
NonHDL: 95.85
Total CHOL/HDL Ratio: 2
Triglycerides: 89 mg/dL (ref 0.0–149.0)
VLDL: 17.8 mg/dL (ref 0.0–40.0)

## 2019-10-15 LAB — URINALYSIS, ROUTINE W REFLEX MICROSCOPIC
Bilirubin Urine: NEGATIVE
Hgb urine dipstick: NEGATIVE
Ketones, ur: NEGATIVE
Nitrite: NEGATIVE
Specific Gravity, Urine: >=1.03 — AB (ref 1.000–1.030)
Total Protein, Urine: NEGATIVE
Urine Glucose: NEGATIVE
Urobilinogen, UA: 0.2 (ref 0.0–1.0)
pH: 5.5 (ref 5.0–8.0)

## 2019-10-16 ENCOUNTER — Other Ambulatory Visit: Payer: Self-pay | Admitting: Family

## 2019-10-16 ENCOUNTER — Telehealth: Payer: Self-pay | Admitting: Family

## 2019-10-16 DIAGNOSIS — R8290 Unspecified abnormal findings in urine: Secondary | ICD-10-CM

## 2019-10-16 DIAGNOSIS — G8929 Other chronic pain: Secondary | ICD-10-CM

## 2019-10-16 DIAGNOSIS — F32A Depression, unspecified: Secondary | ICD-10-CM | POA: Insufficient documentation

## 2019-10-16 DIAGNOSIS — R29898 Other symptoms and signs involving the musculoskeletal system: Secondary | ICD-10-CM | POA: Insufficient documentation

## 2019-10-16 LAB — PMP SCREEN PROFILE (10S), URINE
Amphetamine Scrn, Ur: NEGATIVE ng/mL
BARBITURATE SCREEN URINE: NEGATIVE ng/mL
BENZODIAZEPINE SCREEN, URINE: NEGATIVE ng/mL
CANNABINOIDS UR QL SCN: NEGATIVE ng/mL
Cocaine (Metab) Scrn, Ur: NEGATIVE ng/mL
Creatinine(Crt), U: 130.7 mg/dL (ref 20.0–300.0)
Methadone Screen, Urine: NEGATIVE ng/mL
OXYCODONE+OXYMORPHONE UR QL SCN: NEGATIVE ng/mL
Opiate Scrn, Ur: POSITIVE ng/mL — AB
Ph of Urine: 5.5 (ref 4.5–8.9)
Phencyclidine Qn, Ur: NEGATIVE ng/mL
Propoxyphene Scrn, Ur: NEGATIVE ng/mL

## 2019-10-16 NOTE — Assessment & Plan Note (Signed)
Stable. Continue lisinopril 2.5mg 

## 2019-10-16 NOTE — Assessment & Plan Note (Signed)
Controlled on elavil 75mg . I had discussed this medication with pharmacy Catie , and jointly agreed that this medication is high risk in the persons of her age. I counseled both patient and daughter that I would like to wean off and use a safer medication, such as trazodone or even considering of cymbalta due to chronic pain. QT interval normal range 01/2019. She been well controlled on this medication in regards to depression, and she also has neuropathy. We opted to continue medication as she has been on for 40years and very hesitent to change.

## 2019-10-16 NOTE — Assessment & Plan Note (Signed)
Stable, continue simvastatin 40mg  qd

## 2019-10-16 NOTE — Assessment & Plan Note (Signed)
Concerned with falls, complicated by neuropathy, hard of hearing.Pending Home health evaluation as it pertains to safety and assessment as well as work with balance, strength with home exercises, walking. Will follow

## 2019-10-16 NOTE — Telephone Encounter (Signed)
Amy Calhoun and Amy Calhoun, can we add on urine culture? Or too late?

## 2019-10-16 NOTE — Telephone Encounter (Signed)
Urine was collected on 9/29. Our lab discards the urine after 24 hours.

## 2019-10-16 NOTE — Assessment & Plan Note (Signed)
Chronic, stable. Pain not interfering with QOL nor appears to be affecting walking, balance. Will continue tyleonol #3 and again discussed with patient concern for this contributing to balance concerns, falls. Patient and daughter do not feel that medication is causing grogginess and feel she is using appropriately which I agree. Will continue to prescribe and patient understands to follow up with me every 3 months as controlled substance. I continued to reiterate the importance of storing medication in safe location as well.

## 2019-10-19 MED ORDER — SIMVASTATIN 40 MG PO TABS: 40.0000 mg | ORAL_TABLET | ORAL | 1 refills | Status: DC

## 2019-10-19 NOTE — Addendum Note (Signed)
Addended by: Hulan Fray on: 10/19/2019 03:29 PM   Modules accepted: Orders

## 2019-10-23 ENCOUNTER — Other Ambulatory Visit: Payer: Self-pay

## 2019-10-23 ENCOUNTER — Ambulatory Visit (AMBULATORY_SURGERY_CENTER): Payer: Self-pay | Admitting: *Deleted

## 2019-10-23 VITALS — Ht 62.0 in | Wt 138.0 lb

## 2019-10-23 DIAGNOSIS — R1310 Dysphagia, unspecified: Secondary | ICD-10-CM

## 2019-10-23 NOTE — Progress Notes (Signed)
[place] covid test 10-11  No egg or soy allergy known to patient  No issues with past sedation with any surgeries or procedures no intubation problems in the past  No FH of Malignant Hyperthermia No diet pills per patient No home 02 use per patient  No blood thinners per patient  Pt denies issues with constipation  No A fib or A flutter  EMMI video to pt or via MyChart  COVID 19 guidelines implemented in PV today with Pt and RN   Daughter in pv with pt today- pt uses cane and stood to weigh with no issues   Due to the COVID-19 pandemic we are asking patients to follow these guidelines. Please only bring one care partner. Please be aware that your care partner may wait in the car in the parking lot or if they feel like they will be too hot to wait in the car, they may wait in the lobby on the 4th floor. All care partners are required to wear a mask the entire time (we do not have any that we can provide them), they need to practice social distancing, and we will do a Covid check for all patient's and care partners when you arrive. Also we will check their temperature and your temperature. If the care partner waits in their car they need to stay in the parking lot the entire time and we will call them on their cell phone when the patient is ready for discharge so they can bring the car to the front of the building. Also all patient's will need to wear a mask into building.

## 2019-10-26 ENCOUNTER — Other Ambulatory Visit
Admission: RE | Admit: 2019-10-26 | Discharge: 2019-10-26 | Disposition: A | Discharge status: Home / Self Care | Payer: Medicare HMO | Source: Ambulatory Visit | Attending: Gastroenterology | Admitting: Gastroenterology

## 2019-10-26 ENCOUNTER — Other Ambulatory Visit: Payer: Self-pay

## 2019-10-26 DIAGNOSIS — Z20822 Contact with and (suspected) exposure to covid-19: Secondary | ICD-10-CM | POA: Insufficient documentation

## 2019-10-26 DIAGNOSIS — Z01818 Encounter for other preprocedural examination: Secondary | ICD-10-CM | POA: Diagnosis present

## 2019-10-26 LAB — SARS CORONAVIRUS 2 (TAT 6-24 HRS): SARS Coronavirus 2: NEGATIVE

## 2019-10-27 ENCOUNTER — Encounter: Payer: Self-pay | Admitting: Certified Registered Nurse Anesthetist

## 2019-10-27 ENCOUNTER — Other Ambulatory Visit: Payer: Self-pay | Admitting: Family

## 2019-10-28 ENCOUNTER — Ambulatory Visit (AMBULATORY_SURGERY_CENTER): Payer: Medicare HMO | Admitting: Gastroenterology

## 2019-10-28 ENCOUNTER — Encounter: Payer: Self-pay | Admitting: Gastroenterology

## 2019-10-28 ENCOUNTER — Other Ambulatory Visit: Payer: Self-pay

## 2019-10-28 VITALS — BP 172/68 | HR 69 | Temp 97.8°F | Resp 15 | Ht 62.0 in | Wt 138.0 lb

## 2019-10-28 DIAGNOSIS — K449 Diaphragmatic hernia without obstruction or gangrene: Secondary | ICD-10-CM

## 2019-10-28 DIAGNOSIS — K222 Esophageal obstruction: Secondary | ICD-10-CM

## 2019-10-28 DIAGNOSIS — R1310 Dysphagia, unspecified: Secondary | ICD-10-CM

## 2019-10-28 MED ORDER — SODIUM CHLORIDE 0.9 % IV SOLN: 500.0000 mL | Freq: Once | INTRAVENOUS | Status: DC

## 2019-10-28 NOTE — Progress Notes (Signed)
Called to room to assist during endoscopic procedure.  Patient ID and intended procedure confirmed with present staff. Received instructions for my participation in the procedure from the performing physician.  

## 2019-10-28 NOTE — Op Note (Signed)
[facility] Patient Name: Amy Calhoun Procedure Date: 10/28/2019 2:29 PM MRN: [id number] Endoscopist: Rachael Fee , MD Age: 86 Referring MD:  Date of Birth: 28-Aug-1928 Gender: Female Account #: [id number] Procedure:                Upper GI endoscopy Indications:              Dysphagia; Established care with [facility] 2017                            after move from [place].EGD 07/30/2016 with a                            benign-appearing esophageal stenosis dilated to                            24mm, patient did well for 10 months. Repeat EGD                            08/2017 found focal stricture again at GE junction,                            dilated to 17mm. Also small HH. EGD June 2020 same                            stricture was noted in the mid esophagus. Passage                            of the adult gastroscope cause minor bleeding,                            dilation of the stenosis, in addition a TTS balloon                            dilator was used to dilate the stricture to 13 mm.                            EGD 09/2019 dilation to 7mm Medicines:                Monitored Anesthesia Care Procedure:                Pre-Anesthesia Assessment:                           - Prior to the procedure, a History and Physical                            was performed, and patient medications and                            allergies were reviewed. The patient's tolerance of                            previous anesthesia was also reviewed. The risks  and benefits of the procedure and the sedation                            options and risks were discussed with the patient.                            All questions were answered, and informed consent                            was obtained. Prior Anticoagulants: The patient has                            taken no previous anticoagulant or antiplatelet                            agents. ASA Grade  Assessment: III - A patient with                            severe systemic disease. After reviewing the risks                            and benefits, the patient was deemed in                            satisfactory condition to undergo the procedure.                           After obtaining informed consent, the endoscope was                            passed under direct vision. Throughout the                            procedure, the patient's blood pressure, pulse, and                            oxygen saturations were monitored continuously. The                            Endoscope was introduced through the mouth, and                            advanced to the second part of duodenum. The upper                            GI endoscopy was accomplished without difficulty.                            The patient tolerated the procedure well. Scope In: Scope Out: Findings:                 One benign-appearing, intrinsic moderate stenosis  was found at the gastroesophageal junction (peptic                            stricture vs. thick Schatzki's ring). The stenosis                            was traversed. A TTS dilator was passed through the                            scope. Dilation with an 18-19-20 mm balloon dilator                            was performed to 20 mm. There was usual minor                            mucosal disruption and self limited oozing of blood                            after dilation.                           A small hiatal hernia was present.                           The exam was otherwise without abnormality. Complications:            No immediate complications. Estimated blood loss:                            None. Estimated Blood Loss:     Estimated blood loss: none. Impression:               - Benign-appearing esophageal stenosis. Dilated to                            36mm today..                           - Small hiatal  hernia.                           - The examination was otherwise normal. Recommendation:           - Patient has a contact number available for                            emergencies. The signs and symptoms of potential                            delayed complications were discussed with the                            patient. Return to normal activities tomorrow.                            Written discharge instructions were provided to the  patient.                           - Resume previous diet. Eat very slowly, chew your                            food well and take small bites.                           - Continue present medications. Rachael Feeaniel P Janeka Libman, MD 10/28/2019 2:48:49 PM This report has been signed electronically.

## 2019-10-28 NOTE — Progress Notes (Signed)
VS By CW  MD made aware of elevated BP- 205/81, and CBG- 289. No new orders given at this time   Pt's states no medical or surgical changes since previsit or office visit.

## 2019-10-28 NOTE — Progress Notes (Signed)
BP 212/83, Labetalol given IV, MD update, vss

## 2019-10-28 NOTE — Patient Instructions (Signed)
Follow a post-dilation diet: Clear liquids for 2 hours (until 4:45pm). Then a soft diet (see handout) for the rest of today.    Eat very slowly, chew your food well and take small bites. Make sure your dentures fit properly.   Handouts provided on hiatal hernia, esophageal stricture and post-dilation diet.   OU HAD AN ENDOSCOPIC PROCEDURE TODAY AT THE [facility]:   Refer to the procedure report that was given to you for any specific questions about what was found during the examination.  If the procedure report does not answer your questions, please call your gastroenterologist to clarify.  If you requested that your care partner not be given the details of your procedure findings, then the procedure report has been included in a sealed envelope for you to review at your convenience later.  YOU SHOULD EXPECT: Some feelings of bloating in the abdomen. Passage of more gas than usual.  Walking can help get rid of the air that was put into your GI tract during the procedure and reduce the bloating. If you had a lower endoscopy (such as a colonoscopy or flexible sigmoidoscopy) you may notice spotting of blood in your stool or on the toilet paper. If you underwent a bowel prep for your procedure, you may not have a normal bowel movement for a few days.  Please Note:  You might notice some irritation and congestion in your nose or some drainage.  This is from the oxygen used during your procedure.  There is no need for concern and it should clear up in a day or so.  SYMPTOMS TO REPORT IMMEDIATELY:   Following upper endoscopy (EGD)  Vomiting of blood or coffee ground material  New chest pain or pain under the shoulder blades  Painful or persistently difficult swallowing  New shortness of breath  Fever of 100F or higher  Black, tarry-looking stools  For urgent or emergent issues, a gastroenterologist can be reached at any hour by calling [phone]. Do not use MyChart messaging for  urgent concerns.    DIET: Follow a post-dilation diet: Clear liquids for 2 hours (until 4:45pm). Then a soft diet (see handout) for the rest of today. Drink plenty of fluids but you should avoid alcoholic beverages for 24 hours.  ACTIVITY:  You should plan to take it easy for the rest of today and you should NOT DRIVE or use heavy machinery until tomorrow (because of the sedation medicines used during the test).    FOLLOW UP: Our staff will call the number listed on your records 48-72 hours following your procedure to check on you and address any questions or concerns that you may have regarding the information given to you following your procedure. If we do not reach you, we will leave a message.  We will attempt to reach you two times.  During this call, we will ask if you have developed any symptoms of COVID 19. If you develop any symptoms (ie: fever, flu-like symptoms, shortness of breath, cough etc.) before then, please call [phone].  If you test positive for Covid 19 in the 2 weeks post procedure, please call and report this information to [place].    If any biopsies were taken you will be contacted by phone or by letter within the next 1-3 weeks.  Please call us at [phone] if you have not heard about the biopsies in 3 weeks.    SIGNATURES/CONFIDENTIALITY: You and/or your care partner have signed paperwork which will be  entered into your electronic medical record.  These signatures attest to the fact that that the information above on your After Visit Summary has been reviewed and is understood.  Full responsibility of the confidentiality of this discharge information lies with you and/or your care-partner.

## 2019-10-28 NOTE — Progress Notes (Signed)
Report given to PACU, vss 

## 2019-10-30 ENCOUNTER — Telehealth: Payer: Self-pay

## 2019-10-30 NOTE — Telephone Encounter (Signed)
  Follow up Call-  Call back number 10/28/2019 09/22/2019 06/27/2018 08/16/2017  Post procedure Call Back phone  # [phone] #[phone] Amy Calhoun Pt's daughter [phone]  Permission to leave phone message Yes Yes Yes Yes  Some recent data might be hidden     Patient questions:  Do you have a fever, pain , or abdominal swelling? No. Pain Score  0 *  Have you tolerated food without any problems? Yes.    Have you been able to return to your normal activities? Yes.    Do you have any questions about your discharge instructions: Diet   No. Medications  No. Follow up visit  No.  Do you have questions or concerns about your Care? No.  Actions: * If pain score is 4 or above: No action needed, pain <4.  1. Have you developed a fever since your procedure? no  2.   Have you had an respiratory symptoms (SOB or cough) since your procedure? no  3.   Have you tested positive for COVID 19 since your procedure no  4.   Have you had any family members/close contacts diagnosed with the COVID 19 since your procedure?  no   If yes to any of these questions please route to Laverna Peace, RN and Karlton Lemon, RN

## 2019-10-30 NOTE — Telephone Encounter (Signed)
  Follow up Call-  Call back number 10/28/2019 09/22/2019 06/27/2018 08/16/2017  Post procedure Call Back phone  # [phone] #[phone] [name] Pt's daughter [phone]  Permission to leave phone message Yes Yes Yes Yes  Some recent data might be hidden     Left message

## 2019-11-05 ENCOUNTER — Other Ambulatory Visit: Payer: Self-pay | Admitting: Family

## 2019-11-05 DIAGNOSIS — M1612 Unilateral primary osteoarthritis, left hip: Secondary | ICD-10-CM

## 2019-11-06 NOTE — Telephone Encounter (Signed)
Lat fill 09/29/19 and last OV 10/14/19

## 2019-11-13 ENCOUNTER — Encounter: Payer: Self-pay | Admitting: Internal Medicine

## 2019-11-13 ENCOUNTER — Ambulatory Visit (INDEPENDENT_AMBULATORY_CARE_PROVIDER_SITE_OTHER): Payer: Medicare HMO | Admitting: Internal Medicine

## 2019-11-13 ENCOUNTER — Other Ambulatory Visit: Payer: Self-pay

## 2019-11-13 ENCOUNTER — Other Ambulatory Visit: Payer: Self-pay | Admitting: Internal Medicine

## 2019-11-13 VITALS — BP 166/70 | HR 72 | Ht 62.0 in | Wt 142.1 lb

## 2019-11-13 DIAGNOSIS — N1831 Chronic kidney disease, stage 3a: Secondary | ICD-10-CM | POA: Diagnosis not present

## 2019-11-13 DIAGNOSIS — E1122 Type 2 diabetes mellitus with diabetic chronic kidney disease: Secondary | ICD-10-CM | POA: Diagnosis not present

## 2019-11-13 DIAGNOSIS — Z794 Long term (current) use of insulin: Secondary | ICD-10-CM

## 2019-11-13 DIAGNOSIS — E1165 Type 2 diabetes mellitus with hyperglycemia: Secondary | ICD-10-CM

## 2019-11-13 DIAGNOSIS — E1142 Type 2 diabetes mellitus with diabetic polyneuropathy: Secondary | ICD-10-CM | POA: Diagnosis not present

## 2019-11-13 LAB — POCT GLYCOSYLATED HEMOGLOBIN (HGB A1C): Hemoglobin A1C: 8.3 % — AB (ref 4.0–5.6)

## 2019-11-13 MED ORDER — NOVOLOG FLEXPEN 100 UNIT/ML ~~LOC~~ SOPN: 6.0000 [IU] | PEN_INJECTOR | Freq: Three times a day (TID) | SUBCUTANEOUS | 11 refills | Status: DC

## 2019-11-13 MED ORDER — INSULIN PEN NEEDLE 32G X 4 MM MISC: 1.0000 | Freq: Four times a day (QID) | 11 refills | Status: DC

## 2019-11-13 NOTE — Patient Instructions (Addendum)
-   Lantus 8 Units bedtime  - STOP Regular insulin   - Start Novolog 6 units with each meal  - Novolog correctional insulin:  Use the scale below to help guide you: ( Can be added before the meal) or if you forget your meal dose of Novolog   Blood sugar before meal Number of units to inject  Less than 190 0 unit  191 -  250 1 units  251 -  310 2 units  311 -  370 3 units  371 -  430 4 units  431 -  490 5 units  491 -  550 6 units         HOW TO TREAT LOW BLOOD SUGARS (Blood sugar LESS THAN 70 MG/DL)  Please follow the RULE OF 15 for the treatment of hypoglycemia treatment (when your (blood sugars are less than 70 mg/dL)    STEP 1: Take 15 grams of carbohydrates when your blood sugar is low, which includes:   3-4 GLUCOSE TABS  OR  3-4 OZ OF JUICE OR REGULAR SODA OR  ONE TUBE OF GLUCOSE GEL     STEP 2: RECHECK blood sugar in 15 MINUTES STEP 3: If your blood sugar is still low at the 15 minute recheck --> then, go back to STEP 1 and treat AGAIN with another 15 grams of carbohydrates.

## 2019-11-13 NOTE — Progress Notes (Signed)
Name: Amy Calhoun  Age/ Sex: 86 y.o., female   MRN/ DOB: [id number], 1928-12-23     PCP: Allegra Grana, FNP   Reason for Endocrinology Evaluation: Type 2 Diabetes Mellitus  Initial Endocrine Consultative Visit: 04/15/2018    PATIENT IDENTIFIER: Amy Calhoun is a 86 y.o. female with a past medical history of T2DM, Dyslipidemia, Hypothyroidism and HTN . The patient has followed with Endocrinology clinic since 04/15/2018 for consultative assistance with management of her diabetes.   Patient used to be cared for by her other daughter in [place] but daughter had passed due to a motorcycle accident and Amy Calhoun brought mother to [facility] in 2017.   DIABETIC HISTORY:  Amy Calhoun was diagnosed with DM at age 27. She has been on insulin since her diagnosis.She was briefly on glipizide but this was stopped in March, 2020. Pt did not recall any other oral glycemic agents. Her hemoglobin A1c has ranged from 6.5 % in 2018, peaking at 8.9%  In 2018.  Daughter would like to explore putting mother on glycemic agents, she is concerned about having MDI regimen as mother gets older and worried about future dexterity.   On her initial visit to our clinic, she was on Humulin-N BID, Humulin-R per SS and Tradjenta.  We stopped Tradjenta, started Trulicity in March, 2020, with reduction in insulin doses.   In 09/2018 Replaced Humulin-N with Lantus   SUBJECTIVE:   During the last visit (09/02/2019): Virtual visit , continued MDI regimen   Today (11/13/2019): Amy Calhoun is here with daughter Amy Calhoun for a   follow up on diabetes .  She checks her blood sugars multiple times a day through the freestyle libre. The patient has had hypoglycemic episodes since the last clinic visit The pt has been noted to have hypoglycemia between 7-9 pm . Per Amy Calhoun , pt forgets to take prandial insulin for lunch, she typically takes it when she remembers ~ 4 pm, she takes her supper prandial dose at ~ 6 pm.   Pt tells me she  takes 8 units of regular insulin ( prescribed 6 units) but then again she said she takes 6 units for Breakfast ?   S/P esophageal dilatation 10/2019   HOME DIABETES REGIMEN:   Lantus 8 units daily   Humulin-R 6 units TID QAC- Pt stated taking 8 units    CONTINUOUS GLUCOSE MONITORING RECORD INTERPRETATION    Dates of Recording: 10/16-10/29/2021  Sensor description:Freestyle Libre  Results statistics:   CGM use % of time 90  Average and SD 249/39  Time in range   22%  % Time Above 180 29  % Time above 250 46  % Time Below target 2    Glycemic patterns summary: Hyperglycemia during the day, hypoglycemia around 7 pm   Hypoglycemic episodes occurred frequently between 7-9 pm  Overnight periods: stable     HISTORY:  Past Medical History:  Past Medical History:  Diagnosis Date  . Allergy   . Ambulates with cane   . Anemia   . At high risk for falls   . Cataract    removed both eyes  . Chronic kidney disease 08/07/2016   Chronic renal failure, stage III  . Depression   . Esophageal dysphagia   . Gastric ulcer   . GERD (gastroesophageal reflux disease)   . Hyperlipidemia   . Hypertension   . Hypothyroidism   . IDDM (insulin dependent diabetes mellitus)   . Ischemic colitis (HCC)   .  Osteoarthritis   . Ulcer of esophagus    Past Surgical History:  Past Surgical History:  Procedure Laterality Date  . ABDOMINAL HYSTERECTOMY    . CATARACT EXTRACTION, BILATERAL    . CHOLECYSTECTOMY    . COLONOSCOPY  2010  . ESOPHAGEAL DILATION  08/07/2016   usually a couple times a year, last time 07/30/16  . GALLBLADDER SURGERY    . LUMBAR LAMINECTOMY/DECOMPRESSION MICRODISCECTOMY N/A 08/10/2016   Procedure: BILATERAL HEMILAMINECTOMY LUMBAR THREE-FOUR,LUMBAR FOUR-FIVE,LEFT LUMBAR FIVE-SACRAL ONE HEMILAMINECTOMY AND DECOMPRESSION;  Surgeon: Coletta Memos, MD;  Location: [facility] OR;  Service: Neurosurgery;  Laterality: N/A;  . THYROIDECTOMY    . UPPER GASTROINTESTINAL ENDOSCOPY       Social History:  reports that she has never smoked. She has never used smokeless tobacco. She reports that she does not drink alcohol and does not use drugs. Family History:  Family History  Problem Relation Age of Onset  . Diabetes Mother   . Arthritis Mother   . Hyperlipidemia Mother   . Mental illness Mother   . Heart disease Father   . Mental illness Sister   . Arthritis Maternal Grandmother   . Arthritis Maternal Grandfather   . Colon cancer Neg Hx   . Stomach cancer Neg Hx   . Esophageal cancer Neg Hx   . Rectal cancer Neg Hx   . Colon polyps Neg Hx      HOME MEDICATIONS: Allergies as of 11/13/2019      Reactions   Pravachol [pravastatin] Other (See Comments)   States she refused due to side effects      Medication List       Accurate as of November 13, 2019  7:22 AM. If you have any questions, ask your nurse or doctor.        Acetaminophen-Codeine 300-30 MG tablet TAKE 1 TABLET EVERY 12 HOURS AS NEEDED FOR MODERATE PAIN   albuterol 108 (90 Base) MCG/ACT inhaler Commonly known as: VENTOLIN HFA Inhale 2 puffs into the lungs every 6 (six) hours as needed for wheezing or shortness of breath.   amitriptyline 75 MG tablet Commonly known as: ELAVIL Take 2 tablets (150 mg total) by mouth at bedtime.   ascorbic acid 500 MG tablet Commonly known as: VITAMIN C Take 500 mg by mouth daily.   Bariatric Rollator Misc Use as needed   BD Insulin Syringe U/F 31G X 5/16" 1 ML Misc Generic drug: Insulin Syringe-Needle U-100 USE AS DIRECTED TO ADMINISTER INSULIN   Insulin Syringe-Needle U-100 31G X 5/16" 0.3 ML Misc Commonly known as: BD Insulin Syringe U/F USE FOUR TIMES A DAY   BD Pen Needle Micro U/F 32G X 6 MM Misc Generic drug: Insulin Pen Needle Used to give daily insulin injections.   CRANBERRY PO Take 1 tablet by mouth every Monday, Wednesday, and Friday.   cyanocobalamin 1000 MCG/ML injection Commonly known as: (VITAMIN B-12) INJECT WEEKLY FOR  4 WEEKS THEN INJECT MONTHLY What changed: additional instructions   docusate sodium 100 MG capsule Commonly known as: COLACE Take 1 capsule (100 mg total) by mouth 2 (two) times daily. What changed: when to take this   ferrous sulfate 325 (65 FE) MG EC tablet TAKE 1 TABLET (325 MG TOTAL) BY MOUTH 3 (THREE) TIMES DAILY WITH MEALS. What changed: See the new instructions.   FreeStyle Libre 14 Day Sensor Misc 1 Package by Does not apply route every 14 (fourteen) days.   gabapentin 100 MG capsule Commonly known as: NEURONTIN TAKE 2  CAPSULES THREE TIMES A DAY   glucagon 1 MG injection Inject 1 mg into the vein once as needed. For hypoglycemic episodes. E11.42   insulin regular 100 units/mL injection Commonly known as: HumuLIN R Inject 0.06 mLs (6 Units total) into the skin 3 (three) times daily before meals.   Lantus SoloStar 100 UNIT/ML Solostar Pen Generic drug: insulin glargine Inject 8 Units into the skin at bedtime.   levothyroxine 150 MCG tablet Commonly known as: SYNTHROID TAKE 1 TABLET DAILY   lisinopril 2.5 MG tablet Commonly known as: ZESTRIL Take 1 tablet (2.5 mg total) by mouth daily. Take for blood pressure greater than 140/90 What changed: when to take this   omeprazole 40 MG capsule Commonly known as: PRILOSEC TAKE 1 CAPSULE TWICE A DAY   ondansetron 4 MG disintegrating tablet Commonly known as: Zofran ODT Take 1 tablet (4 mg total) by mouth every 8 (eight) hours as needed for nausea or vomiting.   pyridOXINE 100 MG tablet Commonly known as: VITAMIN B-6 Take 100 mg by mouth daily.   riboflavin 100 MG Tabs tablet Commonly known as: VITAMIN B-2 Take 100 mg by mouth daily.   simvastatin 40 MG tablet Commonly known as: ZOCOR Take 1 tablet (40 mg total) by mouth 4 (four) times a week. (at bedtime)   Turmeric 500 MG Tabs Take 750 mg by mouth 2 (two) times daily.   Vitamin D 50 MCG (2000 UT) tablet Take 2,000 Units by mouth daily.         OBJECTIVE:   Vital Signs: BP (!) 166/70   Pulse 72   Ht 5\' 2"  (1.575 m)   Wt 142 lb 2 oz (64.5 kg)   SpO2 96%   BMI 26.00 kg/m   Wt Readings from Last 3 Encounters:  10/28/19 138 lb (62.6 kg)  10/23/19 138 lb (62.6 kg)  10/14/19 138 lb 6.4 oz (62.8 kg)     Exam: General: Pt appears well and is in NAD  Lungs: Clear with good BS bilat with no rales, rhonchi, or wheezes  Heart: RRR with normal S1 and S2 and no gallops; no murmurs; no rub  Extremities: No pretibial edema.  Neuro: MS is good with appropriate affect, pt is alert and Ox3   DM FOOT EXAM 10/07/2018 The skin of the feet is intact without sores or ulcerations. The pedal pulses are decreased. The sensation is decreased to a screening 5.07, 10 gram monofilament bilaterally    DATA REVIEWED:  Lab Results  Component Value Date   HGBA1C 7.4 (A) 01/01/2019   HGBA1C 9.0 (A) 10/07/2018   HGBA1C 7.9 (H) 04/04/2018   Lab Results  Component Value Date   MICROALBUR 0.8 01/02/2017   LDLCALC 78 10/14/2019   CREATININE 1.15 10/14/2019   Lab Results  Component Value Date   MICRALBCREAT 31.7 (H) 12/23/2017     Lab Results  Component Value Date   CHOL 160 10/14/2019   HDL 64.10 10/14/2019   LDLCALC 78 10/14/2019   TRIG 89.0 10/14/2019   CHOLHDL 2 10/14/2019         ASSESSMENT / PLAN / RECOMMENDATIONS:   1) Insulin-Dependent, Optimally controlled, With CKD III and neuropathic complications - Most recent A1c of 8.3 %. Goal A1c < 7.5 %.    - In review of CGM download,pt noted with hypoglycemic episodes typically between 7-9 pm, this is due to insulin stacking as she would take her lunch prandial dose late ( forgets to take it on time) followed by supper  dose 2 hrs later.  - I am also concerned about the use of vials, we discussed Rubbie BattiestOvolog /Humalog is a safer option for her, we also discussed assisted living that way she has more supervision with insulin administration but pt would like to keep her  independence.  - Will make the following changes  - She will also be provided with correctional insulin if needed   MEDICATIONS:   Continue  Lantus 8 Units QHS  Stop Novolin- R  Start Novolog 6 units with each meal   CF: Novolog ( BG- 130/60)  EDUCATION / INSTRUCTIONS:  BG monitoring instructions: Patient is instructed to check her blood sugars 4 times a day, before meals and bedtime.  Call [facility] if: BG persistently < 70  . I reviewed the Rule of 15 for the treatment of hypoglycemia in detail with the patient. Literature supplied.     F/U in 4 months    Signed electronically by: Lyndle HerrlichAbby Jaralla Tayleigh Wetherell, MD  [facility]  [facility] [address] [place], [place] [address] Phone: [phone] FAX: [phone]   CC: Allegra Granarnett, Margaret G, FNP [address] [place] [address] Phone: [phone]  Fax: [phone]  Return to Endocrinology clinic as below: Future Appointments  Date Time Provider Department Center  11/13/2019  9:50 AM Naira Standiford, Konrad DoloresIbtehal Jaralla, MD [facility] None  08/18/2020  1:30 PM O'Brien-Blaney, Denisa L, LPN [facility]

## 2019-11-16 ENCOUNTER — Encounter: Payer: Self-pay | Admitting: Internal Medicine

## 2019-11-16 ENCOUNTER — Telehealth: Payer: Self-pay | Admitting: Internal Medicine

## 2019-11-16 DIAGNOSIS — E1122 Type 2 diabetes mellitus with diabetic chronic kidney disease: Secondary | ICD-10-CM | POA: Insufficient documentation

## 2019-11-16 DIAGNOSIS — E1142 Type 2 diabetes mellitus with diabetic polyneuropathy: Secondary | ICD-10-CM | POA: Insufficient documentation

## 2019-11-16 DIAGNOSIS — E1165 Type 2 diabetes mellitus with hyperglycemia: Secondary | ICD-10-CM | POA: Insufficient documentation

## 2019-11-16 DIAGNOSIS — Z794 Long term (current) use of insulin: Secondary | ICD-10-CM | POA: Insufficient documentation

## 2019-11-16 DIAGNOSIS — N1831 Chronic kidney disease, stage 3a: Secondary | ICD-10-CM | POA: Insufficient documentation

## 2019-11-16 NOTE — Telephone Encounter (Signed)
Patient's daughter [name] called re: New RX for insulin aspart (NOVOLOG FLEXPEN) 100 UNIT/ML FlexPen requires a PA per  [id number] [place], [place] - [address] Phone:  [phone]  Fax:  [phone]     Who told Patient that they have been trying to contact Dr. Lonzo Cloud about the above since 11/13/19 with no response. Therefore, the above RX has not been picked and [name] requests to be called at ph# [phone] to be advised on the following question: Should patient continue with regular Humalin.

## 2019-11-16 NOTE — Telephone Encounter (Signed)
Please advise. Okay to change to [employer] per pharmacy.

## 2019-11-17 NOTE — Telephone Encounter (Signed)
Dr Lonzo Cloud have already addressed this morning.

## 2019-12-22 DIAGNOSIS — E785 Hyperlipidemia, unspecified: Secondary | ICD-10-CM

## 2019-12-22 DIAGNOSIS — G8929 Other chronic pain: Secondary | ICD-10-CM

## 2019-12-22 DIAGNOSIS — E1122 Type 2 diabetes mellitus with diabetic chronic kidney disease: Secondary | ICD-10-CM | POA: Diagnosis not present

## 2019-12-22 DIAGNOSIS — E039 Hypothyroidism, unspecified: Secondary | ICD-10-CM

## 2019-12-22 DIAGNOSIS — Z9181 History of falling: Secondary | ICD-10-CM

## 2019-12-22 DIAGNOSIS — N183 Chronic kidney disease, stage 3 unspecified: Secondary | ICD-10-CM

## 2019-12-22 DIAGNOSIS — R1319 Other dysphagia: Secondary | ICD-10-CM

## 2019-12-22 DIAGNOSIS — K559 Vascular disorder of intestine, unspecified: Secondary | ICD-10-CM

## 2019-12-22 DIAGNOSIS — G629 Polyneuropathy, unspecified: Secondary | ICD-10-CM

## 2019-12-22 DIAGNOSIS — I129 Hypertensive chronic kidney disease with stage 1 through stage 4 chronic kidney disease, or unspecified chronic kidney disease: Secondary | ICD-10-CM

## 2019-12-22 DIAGNOSIS — H9193 Unspecified hearing loss, bilateral: Secondary | ICD-10-CM

## 2019-12-22 DIAGNOSIS — K219 Gastro-esophageal reflux disease without esophagitis: Secondary | ICD-10-CM

## 2019-12-22 DIAGNOSIS — Z794 Long term (current) use of insulin: Secondary | ICD-10-CM

## 2020-01-15 ENCOUNTER — Other Ambulatory Visit: Payer: Self-pay | Admitting: Family

## 2020-01-15 DIAGNOSIS — M1612 Unilateral primary osteoarthritis, left hip: Secondary | ICD-10-CM

## 2020-01-18 ENCOUNTER — Telehealth: Payer: Self-pay | Admitting: Family

## 2020-01-18 NOTE — Telephone Encounter (Signed)
I called & spoke's with pt's daughter. Patient scheduled 2/23//22.

## 2020-01-18 NOTE — Telephone Encounter (Signed)
Call pt   I have refilled your acetaminophen -codeine  However I wanted to remind you that this is controlled substance.   In order for me to prescribe medication,  patients must be seen every 3 months.   Please make follow-up appointment this month for any further refills.    I looked up patient on [place] Controlled Substances Reporting System and saw no activity that raised concern of inappropriate use.

## 2020-01-21 ENCOUNTER — Telehealth: Payer: Self-pay | Admitting: Family

## 2020-01-21 NOTE — Telephone Encounter (Signed)
Patient's daughter called in wanted to know if Amy Calhoun could help with getting a aide to come a few hours to help with mother call her daughter [phone]

## 2020-01-25 NOTE — Telephone Encounter (Signed)
I spoke with patient's daughter [name] & she said her mom needed help with more ADLS. Like dressing & getting breakfast etc. She didn't need home health. She will look independently to see what may be able to be done for patient that she herself can help her improve with.

## 2020-01-25 NOTE — Telephone Encounter (Signed)
Call daughter The way I can help is through a home  Health referral which would include a nurse What types of activities in  Particular does she help with?  She may prefer to not go through insurance I would look for caregiver ; I dont place referrals for that, only home health

## 2020-01-28 ENCOUNTER — Other Ambulatory Visit: Payer: Self-pay | Admitting: Family

## 2020-02-06 ENCOUNTER — Other Ambulatory Visit: Payer: Self-pay | Admitting: Family

## 2020-02-06 DIAGNOSIS — D649 Anemia, unspecified: Secondary | ICD-10-CM

## 2020-02-17 ENCOUNTER — Ambulatory Visit: Payer: Medicare HMO | Admitting: Internal Medicine

## 2020-02-17 NOTE — Progress Notes (Deleted)
Name: Amy Calhoun  Age/ Sex: 87 y.o., female   MRN/ DOB: [id number], [birth year]     PCP: Amy Grana, FNP   Reason for Endocrinology Evaluation: Type 2 Diabetes Mellitus  Initial Endocrine Consultative Visit: 04/15/2018    PATIENT IDENTIFIER: [name] is a 87 y.o. female with a past medical history of T2DM, Dyslipidemia, Hypothyroidism and HTN . The patient has followed with Endocrinology clinic since 04/15/2018 for consultative assistance with management of her diabetes.   Patient used to be cared for by her other daughter in [place] but daughter had passed due to a motorcycle accident and Amy Calhoun brought mother to [facility] in 2017.   DIABETIC HISTORY:  Amy Calhoun was diagnosed with DM at age 34. She has been on insulin since her diagnosis.She was briefly on glipizide but this was stopped in March, 2020. Pt did not recall any other oral glycemic agents. Her hemoglobin A1c has ranged from 6.5 % in 2018, peaking at 8.9%  In 2018.  Daughter would like to explore putting mother on glycemic agents, she is concerned about having MDI regimen as mother gets older and worried about future dexterity.   On her initial visit to our clinic, she was on Humulin-N BID, Humulin-R per SS and Tradjenta.  We stopped Tradjenta, started Trulicity in March, 2020, with reduction in insulin doses.   In 09/2018 Replaced Humulin-N with Lantus   SUBJECTIVE:   During the last visit (09/02/2019): Virtual visit , continued MDI regimen   Today (02/17/2020): Amy Calhoun is here with daughter Amy Calhoun for a   follow up on diabetes .  She checks her blood sugars multiple times a day through the freestyle libre. The patient has had hypoglycemic episodes since the last clinic visit    S/P esophageal dilatation 10/2019   HOME DIABETES REGIMEN:   Lantus 8 units daily   Humulin-R 6 units TID QAC  CF: Novolog ( BG- 130/60)   CONTINUOUS GLUCOSE MONITORING RECORD INTERPRETATION    Dates of Recording:  10/16-10/29/2021  Sensor description:Freestyle Libre  Results statistics:   CGM use % of time 90  Average and SD 249/39  Time in range   22%  % Time Above 180 29  % Time above 250 46  % Time Below target 2    Glycemic patterns summary: Hyperglycemia during the day, hypoglycemia around 7 pm   Hypoglycemic episodes occurred frequently between 7-9 pm  Overnight periods: stable     HISTORY:  Past Medical History:  Past Medical History:  Diagnosis Date  . Allergy   . Ambulates with cane   . Anemia   . At high risk for falls   . Cataract    removed both eyes  . Chronic kidney disease 08/07/2016   Chronic renal failure, stage III  . Depression   . Esophageal dysphagia   . Gastric ulcer   . GERD (gastroesophageal reflux disease)   . Hyperlipidemia   . Hypertension   . Hypothyroidism   . IDDM (insulin dependent diabetes mellitus)   . Ischemic colitis (HCC)   . Osteoarthritis   . Ulcer of esophagus    Past Surgical History:  Past Surgical History:  Procedure Laterality Date  . ABDOMINAL HYSTERECTOMY    . CATARACT EXTRACTION, BILATERAL    . CHOLECYSTECTOMY    . COLONOSCOPY  2010  . ESOPHAGEAL DILATION  08/07/2016   usually a couple times a year, last time 07/30/16  . GALLBLADDER SURGERY    . LUMBAR  LAMINECTOMY/DECOMPRESSION MICRODISCECTOMY N/A 08/10/2016   Procedure: BILATERAL HEMILAMINECTOMY LUMBAR THREE-FOUR,LUMBAR FOUR-FIVE,LEFT LUMBAR FIVE-SACRAL ONE HEMILAMINECTOMY AND DECOMPRESSION;  Surgeon: Amy Memos, MD;  Location: [facility] OR;  Service: Neurosurgery;  Laterality: N/A;  . THYROIDECTOMY    . UPPER GASTROINTESTINAL ENDOSCOPY      Social History:  reports that she has never smoked. She has never used smokeless tobacco. She reports that she does not drink alcohol and does not use drugs. Family History:  Family History  Problem Relation Age of Onset  . Diabetes Mother   . Arthritis Mother   . Hyperlipidemia Mother   . Mental illness Mother   . Heart disease  Father   . Mental illness Sister   . Arthritis Maternal Grandmother   . Arthritis Maternal Grandfather   . Colon cancer Neg Hx   . Stomach cancer Neg Hx   . Esophageal cancer Neg Hx   . Rectal cancer Neg Hx   . Colon polyps Neg Hx      HOME MEDICATIONS: Allergies as of 02/17/2020      Reactions   Pravachol [pravastatin] Other (See Comments)   States she refused due to side effects      Medication List       Accurate as of February 17, 2020  7:33 AM. If you have any questions, ask your nurse or doctor.        Acetaminophen-Codeine 300-30 MG tablet TAKE 1 TABLET EVERY 12 HOURS AS NEEDED FOR MODERATE PAIN   albuterol 108 (90 Base) MCG/ACT inhaler Commonly known as: VENTOLIN HFA Inhale 2 puffs into the lungs every 6 (six) hours as needed for wheezing or shortness of breath.   amitriptyline 75 MG tablet Commonly known as: ELAVIL Take 2 tablets (150 mg total) by mouth at bedtime.   ascorbic acid 500 MG tablet Commonly known as: VITAMIN C Take 500 mg by mouth daily.   Bariatric Rollator Misc Use as needed   CRANBERRY PO Take 1 tablet by mouth every Monday, Wednesday, and Friday.   cyanocobalamin 1000 MCG/ML injection Commonly known as: (VITAMIN B-12) INJECT WEEKLY FOR 4 WEEKS THEN INJECT MONTHLY What changed: additional instructions   docusate sodium 100 MG capsule Commonly known as: COLACE Take 1 capsule (100 mg total) by mouth 2 (two) times daily. What changed: when to take this   ferrous sulfate 325 (65 FE) MG EC tablet TAKE 1 TABLET (325 MG TOTAL) BY MOUTH 3 (THREE) TIMES DAILY WITH MEALS.   FreeStyle Libre 14 Day Sensor Misc 1 Package by Does not apply route every 14 (fourteen) days.   gabapentin 100 MG capsule Commonly known as: NEURONTIN TAKE 2 CAPSULES THREE TIMES A DAY   glucagon 1 MG injection Inject 1 mg into the vein once as needed. For hypoglycemic episodes. E11.42   insulin lispro 100 UNIT/ML KwikPen Commonly known as: HumaLOG  KwikPen Inject 6 Units into the skin 3 (three) times daily.   Insulin Pen Needle 32G X 4 MM Misc 1 Device by Does not apply route in the morning, at noon, in the evening, and at bedtime.   Lantus SoloStar 100 UNIT/ML Solostar Pen Generic drug: insulin glargine Inject 8 Units into the skin at bedtime.   levothyroxine 150 MCG tablet Commonly known as: SYNTHROID TAKE 1 TABLET DAILY   lisinopril 2.5 MG tablet Commonly known as: ZESTRIL Take 1 tablet (2.5 mg total) by mouth daily. Take for blood pressure greater than 140/90 What changed: when to take this   omeprazole 40 MG  capsule Commonly known as: PRILOSEC TAKE 1 CAPSULE TWICE A DAY   ondansetron 4 MG disintegrating tablet Commonly known as: Zofran ODT Take 1 tablet (4 mg total) by mouth every 8 (eight) hours as needed for nausea or vomiting.   pyridOXINE 100 MG tablet Commonly known as: VITAMIN B-6 Take 100 mg by mouth daily.   riboflavin 100 MG Tabs tablet Commonly known as: VITAMIN B-2 Take 100 mg by mouth daily.   simvastatin 40 MG tablet Commonly known as: ZOCOR Take 1 tablet (40 mg total) by mouth 4 (four) times a week. (at bedtime)   Turmeric 500 MG Tabs Take 750 mg by mouth 2 (two) times daily.   Vitamin D 50 MCG (2000 UT) tablet Take 2,000 Units by mouth daily.        OBJECTIVE:   Vital Signs: There were no vitals taken for this visit.  Wt Readings from Last 3 Encounters:  11/13/19 142 lb 2 oz (64.5 kg)  10/28/19 138 lb (62.6 kg)  10/23/19 138 lb (62.6 kg)     Exam: General: Pt appears well and is in NAD  Lungs: Clear with good BS bilat with no rales, rhonchi, or wheezes  Heart: RRR with normal S1 and S2 and no gallops; no murmurs; no rub  Extremities: No pretibial edema.  Neuro: MS is good with appropriate affect, pt is alert and Ox3   DM FOOT EXAM 10/07/2018 The skin of the feet is intact without sores or ulcerations. The pedal pulses are decreased. The sensation is decreased to a  screening 5.07, 10 gram monofilament bilaterally    DATA REVIEWED:  Lab Results  Component Value Date   HGBA1C 8.3 (A) 11/13/2019   HGBA1C 7.4 (A) 01/01/2019   HGBA1C 9.0 (A) 10/07/2018   Lab Results  Component Value Date   MICROALBUR 0.8 01/02/2017   LDLCALC 78 10/14/2019   CREATININE 1.15 10/14/2019   Lab Results  Component Value Date   MICRALBCREAT 31.7 (H) 12/23/2017     Lab Results  Component Value Date   CHOL 160 10/14/2019   HDL 64.10 10/14/2019   LDLCALC 78 10/14/2019   TRIG 89.0 10/14/2019   CHOLHDL 2 10/14/2019         ASSESSMENT / PLAN / RECOMMENDATIONS:   1) Insulin-Dependent, Optimally controlled, With CKD III and neuropathic complications - Most recent A1c of 8.3 %. Goal A1c < 7.5 %.    - In review of CGM download,pt noted with hypoglycemic episodes typically between 7-9 pm, this is due to insulin stacking as she would take her lunch prandial dose late ( forgets to take it on time) followed by supper dose 2 hrs later.  - I am also concerned about the use of vials, we discussed Rubbie Battiest /Humalog is a safer option for her, we also discussed assisted living that way she has more supervision with insulin administration but pt would like to keep her independence.  - Will make the following changes  - She will also be provided with correctional insulin if needed   MEDICATIONS:   Continue  Lantus 8 Units QHS  Start Novolog 6 units with each meal   CF: Novolog ( BG- 130/60)  EDUCATION / INSTRUCTIONS:  BG monitoring instructions: Patient is instructed to check her blood sugars 4 times a day, before meals and bedtime.  Call [facility] if: BG persistently < 70  . I reviewed the Rule of 15 for the treatment of hypoglycemia in detail with the patient. Literature supplied.  F/U in 4 months    Signed electronically by: Lyndle Herrlich, MD  [facility] Endocrinology  [facility] [address] [place], [place] [address] Phone: [phone] FAX: [phone]   CC: Amy Grana, FNP [address] [place] [address] Phone: [phone]  Fax: [phone]  Return to Endocrinology clinic as below: Future Appointments  Date Time Provider Department Center  02/17/2020 10:10 AM , Konrad Dolores, MD [facility] None  03/09/2020  1:30 PM Amy Grana, FNP [facility]  08/18/2020  1:30 PM O'Brien-Blaney, Denisa L, LPN [facility]

## 2020-02-19 ENCOUNTER — Other Ambulatory Visit: Payer: Self-pay | Admitting: Family

## 2020-02-19 DIAGNOSIS — D649 Anemia, unspecified: Secondary | ICD-10-CM

## 2020-03-09 ENCOUNTER — Ambulatory Visit (INDEPENDENT_AMBULATORY_CARE_PROVIDER_SITE_OTHER): Payer: Medicare HMO | Admitting: Family

## 2020-03-09 ENCOUNTER — Encounter: Payer: Self-pay | Admitting: Family

## 2020-03-09 ENCOUNTER — Telehealth: Payer: Self-pay | Admitting: Family

## 2020-03-09 ENCOUNTER — Telehealth: Payer: Self-pay

## 2020-03-09 ENCOUNTER — Other Ambulatory Visit: Payer: Self-pay

## 2020-03-09 VITALS — BP 140/80 | HR 80 | Temp 97.8°F | Resp 16 | Wt 141.0 lb

## 2020-03-09 DIAGNOSIS — G8929 Other chronic pain: Secondary | ICD-10-CM

## 2020-03-09 DIAGNOSIS — E1129 Type 2 diabetes mellitus with other diabetic kidney complication: Secondary | ICD-10-CM

## 2020-03-09 DIAGNOSIS — E785 Hyperlipidemia, unspecified: Secondary | ICD-10-CM

## 2020-03-09 DIAGNOSIS — R29898 Other symptoms and signs involving the musculoskeletal system: Secondary | ICD-10-CM

## 2020-03-09 DIAGNOSIS — D649 Anemia, unspecified: Secondary | ICD-10-CM

## 2020-03-09 DIAGNOSIS — IMO0002 Reserved for concepts with insufficient information to code with codable children: Secondary | ICD-10-CM

## 2020-03-09 DIAGNOSIS — I10 Essential (primary) hypertension: Secondary | ICD-10-CM | POA: Diagnosis not present

## 2020-03-09 DIAGNOSIS — E1165 Type 2 diabetes mellitus with hyperglycemia: Secondary | ICD-10-CM

## 2020-03-09 DIAGNOSIS — E538 Deficiency of other specified B group vitamins: Secondary | ICD-10-CM

## 2020-03-09 LAB — CBC WITH DIFFERENTIAL/PLATELET
Basophils Absolute: 0.1 10*3/uL (ref 0.0–0.1)
Basophils Relative: 0.7 % (ref 0.0–3.0)
Eosinophils Absolute: 0.1 10*3/uL (ref 0.0–0.7)
Eosinophils Relative: 1.5 % (ref 0.0–5.0)
HCT: 36.2 % (ref 36.0–46.0)
Hemoglobin: 11.6 g/dL — ABNORMAL LOW (ref 12.0–15.0)
Lymphocytes Relative: 22.3 % (ref 12.0–46.0)
Lymphs Abs: 1.5 10*3/uL (ref 0.7–4.0)
MCHC: 32.2 g/dL (ref 30.0–36.0)
MCV: 92 fl (ref 78.0–100.0)
Monocytes Absolute: 0.5 10*3/uL (ref 0.1–1.0)
Monocytes Relative: 8 % (ref 3.0–12.0)
Neutro Abs: 4.6 10*3/uL (ref 1.4–7.7)
Neutrophils Relative %: 67.5 % (ref 43.0–77.0)
Platelets: 209 10*3/uL (ref 150.0–400.0)
RBC: 3.93 Mil/uL (ref 3.87–5.11)
RDW: 13.5 % (ref 11.5–15.5)
WBC: 6.9 10*3/uL (ref 4.0–10.5)

## 2020-03-09 LAB — COMPREHENSIVE METABOLIC PANEL
ALT: 14 U/L (ref 0–35)
AST: 14 U/L (ref 0–37)
Albumin: 4.1 g/dL (ref 3.5–5.2)
Alkaline Phosphatase: 99 U/L (ref 39–117)
BUN: 32 mg/dL — ABNORMAL HIGH (ref 6–23)
CO2: 31 mEq/L (ref 19–32)
Calcium: 8.6 mg/dL (ref 8.4–10.5)
Chloride: 95 mEq/L — ABNORMAL LOW (ref 96–112)
Creatinine, Ser: 1.18 mg/dL (ref 0.40–1.20)
GFR: 40.96 mL/min — ABNORMAL LOW (ref >60.00)
Glucose, Bld: 549 mg/dL (ref 70–99)
Potassium: 5.2 mEq/L — ABNORMAL HIGH (ref 3.5–5.1)
Sodium: 133 mEq/L — ABNORMAL LOW (ref 135–145)
Total Bilirubin: 0.6 mg/dL (ref 0.2–1.2)
Total Protein: 6.7 g/dL (ref 6.0–8.3)

## 2020-03-09 LAB — MICROALBUMIN / CREATININE URINE RATIO
Creatinine,U: 40.2 mg/dL
Microalb Creat Ratio: 4.5 mg/g (ref 0.0–30.0)
Microalb, Ur: 1.8 mg/dL (ref 0.0–1.9)

## 2020-03-09 MED ORDER — FERROUS SULFATE 325 (65 FE) MG PO TBEC
325.0000 mg | DELAYED_RELEASE_TABLET | ORAL | 0 refills | Status: DC
Start: 2020-03-09 — End: 2020-05-20

## 2020-03-09 NOTE — Telephone Encounter (Signed)
CRITICAL VALUE STICKER  CRITICAL VALUE: Glucose 549  RECEIVER (on-site recipient of call): Stirling Orton  DATE & TIME NOTIFIED: 03/09/20; 4:40  MESSENGER (representative from lab): Sy  MD NOTIFIED: Darrick Huntsman   TIME OF NOTIFICATION: 4:45  RESPONSE: see other phone note patient daughter called stating her sugars are high when she arrived home and treated it with insulin

## 2020-03-09 NOTE — Telephone Encounter (Signed)
This patient's diabetes is managed by Dr Lonzo Cloud  , who is on call for Endocrinology this week.

## 2020-03-09 NOTE — Progress Notes (Unsigned)
Subjective:    Patient ID: Amy Calhoun, female    DOB: [birth year], 87 y.o.   MRN: [id number]  CC: Amy Calhoun is a 87 y.o. female who presents today for follow up.   HPI: Accompanied by daughter  No regular exercise. She is less mobile and balance continues to be poor.  Completed PT 4-5 months ago which was helpful.A fall one month ago in which she tripped over carpet  . No head injury. She has bilateral peripheral neuropathy bottom of feet. Using walker, cane at home.    Chronic low back pain- Describes as a stiffness. taking tylenol #3  Twice per day. She doesn't feel groggy on this medication.   Continues to do b12 injections at home and can tell a difference in engergy. Would like to continue.   DM- continues to follow with endocrine for regimen of lantus, humalog. Last a1c 8.3%.  HLD- compliant with zocor.   Hypothyroidism- compliant with synthroid  Takes ferrous sulfate 325mg  every other day.   HTN- compliant with lisinopril 2.5mg  only as needed. At home 138/62. No cp, sob.    Dysphagia- Dilatation of esophagus with Dr 09/2019 and then again 10/2019  HISTORY:  Past Medical History:  Diagnosis Date  . Allergy   . Ambulates with cane   . Anemia   . At high risk for falls   . Cataract    removed both eyes  . Chronic kidney disease 08/07/2016   Chronic renal failure, stage III  . Depression   . Esophageal dysphagia   . Gastric ulcer   . GERD (gastroesophageal reflux disease)   . Hyperlipidemia   . Hypertension   . Hypothyroidism   . IDDM (insulin dependent diabetes mellitus)   . Ischemic colitis (HCC)   . Osteoarthritis   . Ulcer of esophagus    Past Surgical History:  Procedure Laterality Date  . ABDOMINAL HYSTERECTOMY    . CATARACT EXTRACTION, BILATERAL    . CHOLECYSTECTOMY    . COLONOSCOPY  2010  . ESOPHAGEAL DILATION  08/07/2016   usually a couple times a year, last time 07/30/16  . GALLBLADDER SURGERY    . LUMBAR  LAMINECTOMY/DECOMPRESSION MICRODISCECTOMY N/A 08/10/2016   Procedure: BILATERAL HEMILAMINECTOMY LUMBAR THREE-FOUR,LUMBAR FOUR-FIVE,LEFT LUMBAR FIVE-SACRAL ONE HEMILAMINECTOMY AND DECOMPRESSION;  Surgeon: 08/12/2016, MD;  Location: [facility] OR;  Service: Neurosurgery;  Laterality: N/A;  . THYROIDECTOMY    . UPPER GASTROINTESTINAL ENDOSCOPY     Family History  Problem Relation Age of Onset  . Diabetes Mother   . Arthritis Mother   . Hyperlipidemia Mother   . Mental illness Mother   . Heart disease Father   . Mental illness Sister   . Arthritis Maternal Grandmother   . Arthritis Maternal Grandfather   . Colon cancer Neg Hx   . Stomach cancer Neg Hx   . Esophageal cancer Neg Hx   . Rectal cancer Neg Hx   . Colon polyps Neg Hx     Allergies: Pravachol [pravastatin] Current Outpatient Medications on File Prior to Visit  Medication Sig Dispense Refill  . Acetaminophen-Codeine 300-30 MG tablet TAKE 1 TABLET EVERY 12 HOURS AS NEEDED FOR MODERATE PAIN 60 tablet 1  . albuterol (PROVENTIL HFA;VENTOLIN HFA) 108 (90 Base) MCG/ACT inhaler Inhale 2 puffs into the lungs every 6 (six) hours as needed for wheezing or shortness of breath. 1 Inhaler 2  . ascorbic acid (VITAMIN C) 500 MG tablet Take 500 mg by mouth daily.    Coletta Memos  Cholecalciferol (VITAMIN D) 2000 units tablet Take 2,000 Units by mouth daily.     . Continuous Blood Gluc Sensor (FREESTYLE LIBRE 14 DAY SENSOR) MISC 1 Package by Does not apply route every 14 (fourteen) days. 6 each 3  . CRANBERRY PO Take 1 tablet by mouth every Monday, Wednesday, and Friday.    . cyanocobalamin (,VITAMIN B-12,) 1000 MCG/ML injection INJECT 1ML WEEKLY FOR 4 WEEKS THEN INJECT 1ML MONTHLY (Patient taking differently: 1ML MONTHLY) 3 mL 5  . docusate sodium (COLACE) 100 MG capsule Take 1 capsule (100 mg total) by mouth 2 (two) times daily. (Patient taking differently: Take 100 mg by mouth every other day. ) 10 capsule 0  . gabapentin (NEURONTIN) 100 MG capsule TAKE 2  CAPSULES THREE TIMES A DAY 180 capsule 11  . glucagon (GLUCAGON EMERGENCY) 1 MG injection Inject 1 mg into the vein once as needed. For hypoglycemic episodes. E11.42 (Patient not taking: Reported on 10/28/2019) 1 each 3  . Insulin Glargine (LANTUS SOLOSTAR) 100 UNIT/ML Solostar Pen Inject 8 Units into the skin at bedtime. 15 mL 11  . insulin lispro (HUMALOG KWIKPEN) 100 UNIT/ML KwikPen Inject 6 Units into the skin 3 (three) times daily. 15 mL 11  . Insulin Pen Needle 32G X 4 MM MISC 1 Device by Does not apply route in the morning, at noon, in the evening, and at bedtime. 150 each 11  . levothyroxine (SYNTHROID) 150 MCG tablet TAKE 1 TABLET DAILY 90 tablet 3  . lisinopril (ZESTRIL) 2.5 MG tablet Take 1 tablet (2.5 mg total) by mouth daily. Take for blood pressure greater than 140/90 (Patient taking differently: Take 2.5 mg by mouth as directed. Take for blood pressure greater than 140/90) 90 tablet 1  . Misc. Devices (BARIATRIC ROLLATOR) MISC Use as needed 1 each 0  . omeprazole (PRILOSEC) 40 MG capsule TAKE 1 CAPSULE TWICE A DAY 180 capsule 3  . ondansetron (ZOFRAN ODT) 4 MG disintegrating tablet Take 1 tablet (4 mg total) by mouth every 8 (eight) hours as needed for nausea or vomiting. 30 tablet 0  . pyridOXINE (VITAMIN B-6) 100 MG tablet Take 100 mg by mouth daily.    . riboflavin (VITAMIN B-2) 100 MG TABS tablet Take 100 mg by mouth daily.    . simvastatin (ZOCOR) 40 MG tablet Take 1 tablet (40 mg total) by mouth 4 (four) times a week. (at bedtime) 48 tablet 1  . Turmeric 500 MG TABS Take 750 mg by mouth 2 (two) times daily.     No current facility-administered medications on file prior to visit.    Social History   Tobacco Use  . Smoking status: Never Smoker  . Smokeless tobacco: Never Used  Vaping Use  . Vaping Use: Never used  Substance Use Topics  . Alcohol use: No  . Drug use: No    Review of Systems  Constitutional: Negative for chills and fever.  HENT: Positive for trouble  swallowing.   Respiratory: Negative for cough.   Cardiovascular: Negative for chest pain and palpitations.  Gastrointestinal: Negative for nausea and vomiting.      Objective:    BP 140/80   Pulse 80   Temp 97.8 F (36.6 C) (Oral)   Resp 16   Wt 141 lb (64 kg)   SpO2 95%   BMI 25.79 kg/m  BP Readings from Last 3 Encounters:  03/09/20 140/80  11/13/19 (!) 166/70  10/28/19 (!) 172/68   Wt Readings from Last 3 Encounters:  03/09/20 141  lb (64 kg)  11/13/19 142 lb 2 oz (64.5 kg)  10/28/19 138 lb (62.6 kg)    Physical Exam Vitals reviewed.  Constitutional:      Appearance: She is well-developed and well-nourished.  Eyes:     Conjunctiva/sclera: Conjunctivae normal.  Cardiovascular:     Rate and Rhythm: Normal rate and regular rhythm.     Pulses: Normal pulses.     Heart sounds: Normal heart sounds.  Pulmonary:     Effort: Pulmonary effort is normal.     Breath sounds: Normal breath sounds. No wheezing, rhonchi or rales.  Skin:    General: Skin is warm and dry.  Neurological:     Mental Status: She is alert.  Psychiatric:        Mood and Affect: Mood and affect normal.        Speech: Speech normal.        Behavior: Behavior normal.        Thought Content: Thought content normal.        Assessment & Plan:   Problem List Items Addressed This Visit      Cardiovascular and Mediastinum   HTN (hypertension), malignant - Primary    Elevated today. Advised lisinopril 2.5mg . Daughter reports better control at home. Will follow.       Relevant Orders   CBC with Differential/Platelet (Completed)   Comprehensive metabolic panel (Completed)   Microalbumin / creatinine urine ratio (Completed)     Endocrine   DM (diabetes mellitus), type 2, uncontrolled, with renal complications (HCC)    Uncontrolled. Patient is fragile with multiple comorbidities. With age, I worry about ability to dose and administer insulin. Will discuss future planning with patient and daughter at  follow up.   Advised to maintain very close follow up with endocrine. Will follow.         Nervous and Auditory   Weakness of both lower extremities    Gradually worsening secondary to deconditioning, lack of physical exercise, peripheral neuropathy. Referral for PT. Will follow.         Other   Anemia   Relevant Medications   ferrous sulfate 325 (65 FE) MG EC tablet   B12 deficiency    Symptom improvement of fatigue with IM b12 at home. Will continue.      Chronic pain    Low back pain at baseline. Continue to discuss safety as it related to tylenol #3. She denies grogginess or falls related to medication. Will continue to closely monitor to ensure safety. Continue regimen.       Hyperlipidemia    Stable.continue zocor 40mg           I have changed Sharnette M. Rosenstock's ferrous sulfate. I am also having her maintain her pyridOXINE, riboflavin, glucagon, docusate sodium, CRANBERRY PO, albuterol, Vitamin D, Turmeric, ondansetron, Lantus SoloStar, omeprazole, cyanocobalamin, gabapentin, lisinopril, Bariatric Rollator, FreeStyle Libre 14 Day Sensor, ascorbic acid, simvastatin, Insulin Pen Needle, insulin lispro, Acetaminophen-Codeine, and levothyroxine.   Meds ordered this encounter  Medications  . ferrous sulfate 325 (65 FE) MG EC tablet    Sig: Take 1 tablet (325 mg total) by mouth every other day.    Dispense:  45 tablet    Refill:  0    Return precautions given.   Risks, benefits, and alternatives of the medications and treatment plan prescribed today were discussed, and patient expressed understanding.   Education regarding symptom management and diagnosis given to patient on AVS.  Continue to follow with  G, FNP for routine health maintenance.   Mikya Mae [facility] and I agreed with plan.   Rennie Plowman, FNP

## 2020-03-09 NOTE — Telephone Encounter (Signed)
If BS is > 350 at 6 pm.  Needs to take mom to ER because se become very dehdyrated and lethargic.

## 2020-03-09 NOTE — Telephone Encounter (Signed)
Spoke to daughter prior to message from Dr. Darrick Huntsman daughter had checked CBG on arrival home from appointment and gave humalog form sliding scale due to CBG checked high no reading just high. Advised daughter of Dr. Darrick Huntsman message that if CBG was >350 need for ER, daughter agreed and stated she would take to ER if >350.

## 2020-03-09 NOTE — Telephone Encounter (Signed)
Patient 's daughter called in to let Claris Che know that her mother blood sugar was high when she got home but she treated it with insulin and she also sent the information Claris Che ask for

## 2020-03-09 NOTE — Patient Instructions (Signed)
Nice to see you both!

## 2020-03-10 ENCOUNTER — Other Ambulatory Visit: Payer: Self-pay | Admitting: Internal Medicine

## 2020-03-10 DIAGNOSIS — E1142 Type 2 diabetes mellitus with diabetic polyneuropathy: Secondary | ICD-10-CM

## 2020-03-10 NOTE — Telephone Encounter (Cosign Needed Addendum)
Spoke with DPR CBG are down per daughter she could not remember range, I advised her to notify her Endocrinologist if CBG is over 350.I also mentioned CGM and patient has a Josephine Igo the first one which still requires going to her mothers to get a reading the new Libre freestyle 2 reading could possibly be attained and daughter could receive alerts when CBG to high or to low. Libre freestyle 2 even detects trends and an notify patient and daughter CBG is trending down or up. Patient sees Endo on 03/16/20.

## 2020-03-10 NOTE — Telephone Encounter (Signed)
See phone note DPR aware to call endo for CBG >350 and daughter reports CBG down under 200 today.

## 2020-03-11 ENCOUNTER — Other Ambulatory Visit: Payer: Self-pay | Admitting: Family

## 2020-03-11 DIAGNOSIS — E538 Deficiency of other specified B group vitamins: Secondary | ICD-10-CM | POA: Insufficient documentation

## 2020-03-11 DIAGNOSIS — R29898 Other symptoms and signs involving the musculoskeletal system: Secondary | ICD-10-CM

## 2020-03-11 NOTE — Telephone Encounter (Signed)
Noted  

## 2020-03-11 NOTE — Assessment & Plan Note (Signed)
Symptom improvement of fatigue with IM b12 at home. Will continue.

## 2020-03-11 NOTE — Assessment & Plan Note (Signed)
Stable.continue zocor 40mg 

## 2020-03-11 NOTE — Assessment & Plan Note (Signed)
Uncontrolled. Patient is fragile with multiple comorbidities. With age, I worry about ability to dose and administer insulin. Will discuss future planning with patient and daughter at follow up.   Advised to maintain very close follow up with endocrine. Will follow.

## 2020-03-11 NOTE — Assessment & Plan Note (Signed)
Elevated today. Advised lisinopril 2.5mg . Daughter reports better control at home. Will follow.

## 2020-03-11 NOTE — Telephone Encounter (Signed)
Noted Agree with ED evaluation if BG > 350  for appt with endocrine 03/16/20

## 2020-03-11 NOTE — Assessment & Plan Note (Signed)
Low back pain at baseline. Continue to discuss safety as it related to tylenol #3. She denies grogginess or falls related to medication. Will continue to closely monitor to ensure safety. Continue regimen.

## 2020-03-11 NOTE — Assessment & Plan Note (Signed)
Gradually worsening secondary to deconditioning, lack of physical exercise, peripheral neuropathy. Referral for PT. Will follow.

## 2020-03-16 ENCOUNTER — Encounter: Payer: Self-pay | Admitting: Internal Medicine

## 2020-03-16 ENCOUNTER — Ambulatory Visit (INDEPENDENT_AMBULATORY_CARE_PROVIDER_SITE_OTHER): Payer: Medicare HMO | Admitting: Internal Medicine

## 2020-03-16 ENCOUNTER — Other Ambulatory Visit: Payer: Self-pay

## 2020-03-16 ENCOUNTER — Telehealth: Payer: Self-pay | Admitting: Family

## 2020-03-16 VITALS — BP 142/82 | HR 60 | Resp 98 | Ht 62.0 in | Wt 138.1 lb

## 2020-03-16 DIAGNOSIS — E1142 Type 2 diabetes mellitus with diabetic polyneuropathy: Secondary | ICD-10-CM

## 2020-03-16 DIAGNOSIS — E1122 Type 2 diabetes mellitus with diabetic chronic kidney disease: Secondary | ICD-10-CM

## 2020-03-16 DIAGNOSIS — E1165 Type 2 diabetes mellitus with hyperglycemia: Secondary | ICD-10-CM | POA: Diagnosis not present

## 2020-03-16 DIAGNOSIS — Z794 Long term (current) use of insulin: Secondary | ICD-10-CM

## 2020-03-16 DIAGNOSIS — N1831 Chronic kidney disease, stage 3a: Secondary | ICD-10-CM

## 2020-03-16 LAB — BASIC METABOLIC PANEL
BUN: 28 mg/dL — ABNORMAL HIGH (ref 6–23)
CO2: 31 mEq/L (ref 19–32)
Calcium: 8.8 mg/dL (ref 8.4–10.5)
Chloride: 98 mEq/L (ref 96–112)
Creatinine, Ser: 1.12 mg/dL (ref 0.40–1.20)
GFR: 43.6 mL/min — ABNORMAL LOW (ref >60.00)
Glucose, Bld: 259 mg/dL — ABNORMAL HIGH (ref 70–99)
Potassium: 4.8 mEq/L (ref 3.5–5.1)
Sodium: 134 mEq/L — ABNORMAL LOW (ref 135–145)

## 2020-03-16 LAB — POCT GLYCOSYLATED HEMOGLOBIN (HGB A1C): Hemoglobin A1C: 8.6 % — AB (ref 4.0–5.6)

## 2020-03-16 NOTE — Progress Notes (Signed)
Name: Amy Calhoun  Age/ Sex: 87 y.o., female   MRN/ DOB: [id number], 04-19-1928     PCP: Allegra Grana, FNP   Reason for Endocrinology Evaluation: Type 2 Diabetes Mellitus  Initial Endocrine Consultative Visit: 04/15/2018    PATIENT IDENTIFIER: Amy Calhoun is a 87 y.o. female with a past medical history of T2DM, Dyslipidemia, Hypothyroidism and HTN . The patient has followed with Endocrinology clinic since 04/15/2018 for consultative assistance with management of her diabetes.   Patient used to be cared for by her other daughter in [place] but daughter had passed due to a motorcycle accident and Amy Calhoun brought mother to [facility] in 2017.   DIABETIC HISTORY:  Amy Calhoun was diagnosed with DM at age 77. She has been on insulin since her diagnosis.She was briefly on glipizide but this was stopped in March, 2020. Pt did not recall any other oral glycemic agents. Her hemoglobin A1c has ranged from 6.5 % in 2018, peaking at 8.9%  In 2018.  Daughter would like to explore putting mother on glycemic agents, she is concerned about having MDI regimen as mother gets older and worried about future dexterity.   On her initial visit to our clinic, she was on Humulin-N BID, Humulin-R per SS and Tradjenta.  We stopped Tradjenta, started Trulicity in March, 2020, with reduction in insulin doses.   In 09/2018 Replaced Humulin-N with Lantus   SUBJECTIVE:   During the last visit (09/02/2019): Virtual visit , continued MDI regimen      Today (03/16/2020): Amy Calhoun is here with daughter Amy Calhoun for a follow up on diabetes .  She checks her blood sugars multiple times a day through the freestyle libre. The patient has not hypoglycemic episodes since the last clinic visit.    S/P esophageal dilatation 10/2019 She is having tingling of both arms when she goes to bed at night     HOME DIABETES REGIMEN:   Lantus 8 units daily   Novolog 6 units TID QAC  CF: Novolog ( BG-  130/60)   CONTINUOUS GLUCOSE MONITORING RECORD INTERPRETATION    Dates of Recording: 2/17-03/16/2020  Sensor description:Freestyle Libre  Results statistics:   CGM use % of time 80  Average and SD 280/30.5  Time in range  11 %  % Time Above 180 30  % Time above 250 59  % Time Below target 0     Glycemic patterns summary: Hyperglycemia during the day,and night  Hypoglycemic episodes occurred none  Overnight periods: stable but high     HISTORY:  Past Medical History:  Past Medical History:  Diagnosis Date  . Allergy   . Ambulates with cane   . Anemia   . At high risk for falls   . Cataract    removed both eyes  . Chronic kidney disease 08/07/2016   Chronic renal failure, stage III  . Depression   . Esophageal dysphagia   . Gastric ulcer   . GERD (gastroesophageal reflux disease)   . Hyperlipidemia   . Hypertension   . Hypothyroidism   . IDDM (insulin dependent diabetes mellitus)   . Ischemic colitis (HCC)   . Osteoarthritis   . Ulcer of esophagus    Past Surgical History:  Past Surgical History:  Procedure Laterality Date  . ABDOMINAL HYSTERECTOMY    . CATARACT EXTRACTION, BILATERAL    . CHOLECYSTECTOMY    . COLONOSCOPY  2010  . ESOPHAGEAL DILATION  08/07/2016   usually a couple times a  year, last time 07/30/16  . GALLBLADDER SURGERY    . LUMBAR LAMINECTOMY/DECOMPRESSION MICRODISCECTOMY N/A 08/10/2016   Procedure: BILATERAL HEMILAMINECTOMY LUMBAR THREE-FOUR,LUMBAR FOUR-FIVE,LEFT LUMBAR FIVE-SACRAL ONE HEMILAMINECTOMY AND DECOMPRESSION;  Surgeon: Coletta Memos, MD;  Location: [facility] OR;  Service: Neurosurgery;  Laterality: N/A;  . THYROIDECTOMY    . UPPER GASTROINTESTINAL ENDOSCOPY      Social History:  reports that she has never smoked. She has never used smokeless tobacco. She reports that she does not drink alcohol and does not use drugs. Family History:  Family History  Problem Relation Age of Onset  . Diabetes Mother   . Arthritis Mother   .  Hyperlipidemia Mother   . Mental illness Mother   . Heart disease Father   . Mental illness Sister   . Arthritis Maternal Grandmother   . Arthritis Maternal Grandfather   . Colon cancer Neg Hx   . Stomach cancer Neg Hx   . Esophageal cancer Neg Hx   . Rectal cancer Neg Hx   . Colon polyps Neg Hx      HOME MEDICATIONS: Allergies as of 03/16/2020      Reactions   Pravachol [pravastatin] Other (See Comments)   States she refused due to side effects      Medication List       Accurate as of March 16, 2020  2:34 PM. If you have any questions, ask your nurse or doctor.        Acetaminophen-Codeine 300-30 MG tablet TAKE 1 TABLET EVERY 12 HOURS AS NEEDED FOR MODERATE PAIN   albuterol 108 (90 Base) MCG/ACT inhaler Commonly known as: VENTOLIN HFA Inhale 2 puffs into the lungs every 6 (six) hours as needed for wheezing or shortness of breath.   amitriptyline 75 MG tablet Commonly known as: ELAVIL TAKE 2 TABLETS AT BEDTIME   ascorbic acid 500 MG tablet Commonly known as: VITAMIN C Take 500 mg by mouth daily.   Bariatric Rollator Misc Use as needed   CRANBERRY PO Take 1 tablet by mouth every Monday, Wednesday, and Friday.   cyanocobalamin 1000 MCG/ML injection Commonly known as: (VITAMIN B-12) INJECT WEEKLY FOR 4 WEEKS THEN INJECT MONTHLY What changed: additional instructions   docusate sodium 100 MG capsule Commonly known as: COLACE Take 1 capsule (100 mg total) by mouth 2 (two) times daily. What changed: when to take this   ferrous sulfate 325 (65 FE) MG EC tablet Take 1 tablet (325 mg total) by mouth every other day.   FreeStyle Libre 14 Day Sensor Misc 1 Package by Does not apply route every 14 (fourteen) days.   gabapentin 100 MG capsule Commonly known as: NEURONTIN TAKE 2 CAPSULES THREE TIMES A DAY   glucagon 1 MG injection Inject 1 mg into the vein once as needed. For hypoglycemic episodes. E11.42   insulin lispro 100 UNIT/ML KwikPen Commonly  known as: HumaLOG KwikPen Inject 6 Units into the skin 3 (three) times daily.   Insulin Pen Needle 32G X 4 MM Misc 1 Device by Does not apply route in the morning, at noon, in the evening, and at bedtime.   Lantus SoloStar 100 UNIT/ML Solostar Pen Generic drug: insulin glargine Inject 8 Units into the skin at bedtime.   levothyroxine 150 MCG tablet Commonly known as: SYNTHROID TAKE 1 TABLET DAILY   lisinopril 2.5 MG tablet Commonly known as: ZESTRIL Take 1 tablet (2.5 mg total) by mouth daily. Take for blood pressure greater than 140/90 What changed: when to take this  omeprazole 40 MG capsule Commonly known as: PRILOSEC TAKE 1 CAPSULE TWICE A DAY   ondansetron 4 MG disintegrating tablet Commonly known as: Zofran ODT Take 1 tablet (4 mg total) by mouth every 8 (eight) hours as needed for nausea or vomiting.   pyridOXINE 100 MG tablet Commonly known as: VITAMIN B-6 Take 100 mg by mouth daily.   riboflavin 100 MG Tabs tablet Commonly known as: VITAMIN B-2 Take 100 mg by mouth daily.   simvastatin 40 MG tablet Commonly known as: ZOCOR Take 1 tablet (40 mg total) by mouth 4 (four) times a week. (at bedtime)   Turmeric 500 MG Tabs Take 750 mg by mouth 2 (two) times daily.   Vitamin D 50 MCG (2000 UT) tablet Take 2,000 Units by mouth daily.        OBJECTIVE:   Vital Signs: BP (!) 142/82   Pulse 60   Resp (!) 98   Ht 5\' 2"  (1.575 m)   Wt 138 lb 2 oz (62.7 kg)   BMI 25.26 kg/m   Wt Readings from Last 3 Encounters:  03/16/20 138 lb 2 oz (62.7 kg)  03/09/20 141 lb (64 kg)  11/13/19 142 lb 2 oz (64.5 kg)     Exam: General: Pt appears well and is in NAD  Lungs: Clear with good BS bilat with no rales, rhonchi, or wheezes  Heart: RRR with normal S1 and S2 and no gallops; no murmurs; no rub  Extremities: No pretibial edema.  Neuro: MS is good with appropriate affect, pt is alert and Ox3   DM FOOT EXAM 10/07/2018 The skin of the feet is intact without sores  or ulcerations. The pedal pulses are decreased. The sensation is decreased to a screening 5.07, 10 gram monofilament bilaterally    DATA REVIEWED:  Lab Results  Component Value Date   HGBA1C 8.6 (A) 03/16/2020   HGBA1C 8.3 (A) 11/13/2019   HGBA1C 7.4 (A) 01/01/2019   Lab Results  Component Value Date   MICROALBUR 1.8 03/09/2020   LDLCALC 78 10/14/2019   CREATININE 1.18 03/09/2020   Lab Results  Component Value Date   MICRALBCREAT 4.5 03/09/2020     Lab Results  Component Value Date   CHOL 160 10/14/2019   HDL 64.10 10/14/2019   LDLCALC 78 10/14/2019   TRIG 89.0 10/14/2019   CHOLHDL 2 10/14/2019       Results for Amy Calhoun, Amy Calhoun (MRN [name]) as of 03/17/2020 07:30  Ref. Range 03/16/2020 14:41  Sodium Latest Ref Range: 135 - 145 mEq/L 134 (L)  Potassium Latest Ref Range: 3.5 - 5.1 mEq/L 4.8  Chloride Latest Ref Range: 96 - 112 mEq/L 98  CO2 Latest Ref Range: 19 - 32 mEq/L 31  Glucose Latest Ref Range: 70 - 99 mg/dL 05/16/2020 (H)  BUN Latest Ref Range: 6 - 23 mg/dL 28 (H)  Creatinine Latest Ref Range: 0.40 - 1.20 mg/dL 277  Calcium Latest Ref Range: 8.4 - 10.5 mg/dL 8.8  GFR Latest Ref Range: >60.00 mL/min 43.60 (L)    ASSESSMENT / PLAN / RECOMMENDATIONS:   1) Insulin-Dependent, Suboptimally  controlled, With CKD III and neuropathic complications - Most recent A1c of 8.6 %. Goal A1c < 8.0%.    - In review of the CGM, the pt has NOT had any hypoglycemic episodes which has been a frequent occurrence in the past. Pt with severe fear of hypoglycemia and the goal is to keep BG's less then 250 mg/dL and avoid hypoglycemia at this advanced age.  -  Daughter admits pt was on vacation and was eating donuts and other sweets, they did not use the correction scale on vacation  - Last night her Bg was 155 at bedtime, she ate a big bag of chips , and was scared to take Lantus but daughter gave her 4 units. This AM Bg's was 224 mg/dL/ I have explained the importance of Lantus intake  daily, in the future if pt is so scared of it , she may take 6 units but 4 units is a bit low  - Today she took 6 units with lunch and post-prandial was 218 mg/dL . No changes as she has less lantus on board  - We discussed the importance of checking glucose before each meal, taking prandial insulin and using correction scale if needed before the meal and NOT after the meal  - BMP looks better   MEDICATIONS:   Continue  Lantus 8 Units QHS  Continue Novolog 6 units with each meal   CF: Novolog ( BG- 130/60)  EDUCATION / INSTRUCTIONS:  BG monitoring instructions: Patient is instructed to check her blood sugars 4 times a day, before meals and bedtime.  Call [facility] if: BG persistently < 70  . I reviewed the Rule of 15 for the treatment of hypoglycemia in detail with the patient. Literature supplied. I spent 25 minutes preparing to see the patient by review of recent labs, imaging and procedures, obtaining and reviewing separately obtained history, communicating with the patient/family or caregiver, ordering medications, tests or procedures, and documenting clinical information in the EHR including the differential Dx, treatment, and any further evaluation and other management      F/U in 6 months    Signed electronically by: Lyndle HerrlichAbby Jaralla Breydon Senters, MD  [facility]  [facility] [address] [place], [place] [address] Phone: [phone] FAX: [phone]   CC: Allegra Granarnett, Margaret G, FNP [address] [place] [address] Phone: [phone]  Fax: [phone]  Return to Endocrinology clinic as below: Future Appointments  Date Time Provider Department Center  08/18/2020  1:30 PM O'Brien-Blaney, Denisa L, LPN [id number]

## 2020-03-16 NOTE — Telephone Encounter (Signed)
Please advise 

## 2020-03-16 NOTE — Patient Instructions (Addendum)
-   Continue  Lantus 8 Units bedtime  - Continue Novolog 6 units with each meal  - Novolog correctional insulin:  Use the scale below to help guide you: ( Can be added before the meal) or if you forget your meal dose of Novolog   Blood sugar before meal Number of units to inject  Less than 190 0 unit  191 -  250 1 units  251 -  310 2 units  311 -  370 3 units  371 -  430 4 units  431 -  490 5 units  491 -  550 6 units         HOW TO TREAT LOW BLOOD SUGARS (Blood sugar LESS THAN 70 MG/DL)  Please follow the RULE OF 15 for the treatment of hypoglycemia treatment (when your (blood sugars are less than 70 mg/dL)    STEP 1: Take 15 grams of carbohydrates when your blood sugar is low, which includes:   3-4 GLUCOSE TABS  OR  3-4 OZ OF JUICE OR REGULAR SODA OR  ONE TUBE OF GLUCOSE GEL     STEP 2: RECHECK blood sugar in 15 MINUTES STEP 3: If your blood sugar is still low at the 15 minute recheck --> then, go back to STEP 1 and treat AGAIN with another 15 grams of carbohydrates.

## 2020-03-16 NOTE — Telephone Encounter (Signed)
Pt daughter called she wanted to know if her mother needed to keep taking iron

## 2020-03-18 ENCOUNTER — Ambulatory Visit (INDEPENDENT_AMBULATORY_CARE_PROVIDER_SITE_OTHER): Payer: Medicare HMO

## 2020-03-18 ENCOUNTER — Other Ambulatory Visit: Payer: Self-pay | Admitting: Family

## 2020-03-18 ENCOUNTER — Encounter: Payer: Self-pay | Admitting: Family

## 2020-03-18 ENCOUNTER — Other Ambulatory Visit: Payer: Self-pay

## 2020-03-18 ENCOUNTER — Ambulatory Visit (INDEPENDENT_AMBULATORY_CARE_PROVIDER_SITE_OTHER): Payer: Medicare HMO | Admitting: Family

## 2020-03-18 VITALS — BP 140/56 | HR 59 | Temp 97.6°F | Ht 62.0 in | Wt 137.6 lb

## 2020-03-18 DIAGNOSIS — M1612 Unilateral primary osteoarthritis, left hip: Secondary | ICD-10-CM | POA: Diagnosis not present

## 2020-03-18 DIAGNOSIS — M25531 Pain in right wrist: Secondary | ICD-10-CM | POA: Diagnosis not present

## 2020-03-18 MED ORDER — ACETAMINOPHEN-CODEINE 300-30 MG PO TABS: 1.0000 | ORAL_TABLET | Freq: Two times a day (BID) | ORAL | 1 refills | Status: DC | PRN

## 2020-03-18 NOTE — Assessment & Plan Note (Addendum)
Acute onset. Presentation isnt consistent with gout, infection. Pending labs, xray. Patient well appearing nontoxic. Discussed osteoarthritis. Agreed conservative management with heat or ice, as well as topical therapy with topical lidocaine, biofreeze or limited use of voltaren  Gel. She will let me know she is doing.

## 2020-03-18 NOTE — Progress Notes (Signed)
Subjective:    Patient ID: Amy Calhoun, female    DOB: 9/1/Amy Beam1930, 87 y.o.   MRN: [id number]  CC: Amy Calhoun Amy Calhoun is a 87 y.o. female who presents today for an acute visit.    HPI: Accompanied by daughter Patient complains of right wrist  Pain started yesterday. Notes swelling which had become more noticeable on dorsal aspect of right hand, however improved as the day has progressed.  She is unable to close her right fist Denies fall or injury She has slept the last 2 nights on right side  She has taken tylenol arthritis with topical lidocaine without relief. Tylenol #3 which she has for back pain which has been helpful. She also asks for a refill of this.  In the past heat has felt good on hand pain. She doesn't care for ice.  No fever, N, V, numbness. Skin intact.  No h/o gout. She hasnt lifted anything heavy. She has been working on puzzle yesterday.  Right handed.       HISTORY:  Past Medical History:  Diagnosis Date   Allergy    Ambulates with cane    Anemia    At high risk for falls    Cataract    removed both eyes   Chronic kidney disease 08/07/2016   Chronic renal failure, stage III   Depression    Esophageal dysphagia    Gastric ulcer    GERD (gastroesophageal reflux disease)    Hyperlipidemia    Hypertension    Hypothyroidism    IDDM (insulin dependent diabetes mellitus)    Ischemic colitis (HCC)    Osteoarthritis    Ulcer of esophagus    Past Surgical History:  Procedure Laterality Date   ABDOMINAL HYSTERECTOMY     CATARACT EXTRACTION, BILATERAL     CHOLECYSTECTOMY     COLONOSCOPY  2010   ESOPHAGEAL DILATION  08/07/2016   usually a couple times a year, last time 07/30/16   GALLBLADDER SURGERY     LUMBAR LAMINECTOMY/DECOMPRESSION MICRODISCECTOMY N/A 08/10/2016   Procedure: BILATERAL HEMILAMINECTOMY LUMBAR THREE-FOUR,LUMBAR FOUR-FIVE,LEFT LUMBAR FIVE-SACRAL ONE HEMILAMINECTOMY AND DECOMPRESSION;  Surgeon: Coletta Memosabbell, Kyle,  MD;  Location: [facility] OR;  Service: Neurosurgery;  Laterality: N/A;   THYROIDECTOMY     UPPER GASTROINTESTINAL ENDOSCOPY     Family History  Problem Relation Age of Onset   Diabetes Mother    Arthritis Mother    Hyperlipidemia Mother    Mental illness Mother    Heart disease Father    Mental illness Sister    Arthritis Maternal Grandmother    Arthritis Maternal Grandfather    Colon cancer Neg Hx    Stomach cancer Neg Hx    Esophageal cancer Neg Hx    Rectal cancer Neg Hx    Colon polyps Neg Hx     Allergies: Pravachol [pravastatin] Current Outpatient Medications on File Prior to Visit  Medication Sig Dispense Refill   albuterol (PROVENTIL HFA;VENTOLIN HFA) 108 (90 Base) MCG/ACT inhaler Inhale 2 puffs into the lungs every 6 (six) hours as needed for wheezing or shortness of breath. 1 Inhaler 2   amitriptyline (ELAVIL) 75 MG tablet TAKE 2 TABLETS AT BEDTIME 180 tablet 3   ascorbic acid (VITAMIN C) 500 MG tablet Take 500 mg by mouth daily.     Cholecalciferol (VITAMIN D) 2000 units tablet Take 2,000 Units by mouth daily.      Continuous Blood Gluc Sensor (FREESTYLE LIBRE 14 DAY SENSOR) MISC 1 Package by Does not apply route  every 14 (fourteen) days. 6 each 3   CRANBERRY PO Take 1 tablet by mouth every Monday, Wednesday, and Friday.     cyanocobalamin (,VITAMIN B-12,) 1000 MCG/ML injection INJECT WEEKLY FOR 4 WEEKS THEN INJECT MONTHLY (Patient taking differently: MONTHLY) 3 mL 5   docusate sodium (COLACE) 100 MG capsule Take 1 capsule (100 mg total) by mouth 2 (two) times daily. (Patient taking differently: Take 100 mg by mouth every other day.) 10 capsule 0   ferrous sulfate 325 (65 FE) MG EC tablet Take 1 tablet (325 mg total) by mouth every other day. 45 tablet 0   gabapentin (NEURONTIN) 100 MG capsule TAKE 2 CAPSULES THREE TIMES A DAY 180 capsule 11   glucagon (GLUCAGON EMERGENCY) 1 MG injection Inject 1 mg into the vein once as needed. For  hypoglycemic episodes. E11.42 1 each 3   Insulin Glargine (LANTUS SOLOSTAR) 100 UNIT/ML Solostar Pen Inject 8 Units into the skin at bedtime. 15 mL 11   insulin lispro (HUMALOG KWIKPEN) 100 UNIT/ML KwikPen Inject 6 Units into the skin 3 (three) times daily. 15 mL 11   Insulin Pen Needle 32G X 4 MM MISC 1 Device by Does not apply route in the morning, at noon, in the evening, and at bedtime. 150 each 11   levothyroxine (SYNTHROID) 150 MCG tablet TAKE 1 TABLET DAILY 90 tablet 3   lisinopril (ZESTRIL) 2.5 MG tablet Take 1 tablet (2.5 mg total) by mouth daily. Take for blood pressure greater than 140/90 (Patient taking differently: Take 2.5 mg by mouth as directed. Take for blood pressure greater than 140/90) 90 tablet 1   Misc. Devices (BARIATRIC ROLLATOR) MISC Use as needed 1 each 0   omeprazole (PRILOSEC) 40 MG capsule TAKE 1 CAPSULE TWICE A DAY 180 capsule 3   ondansetron (ZOFRAN ODT) 4 MG disintegrating tablet Take 1 tablet (4 mg total) by mouth every 8 (eight) hours as needed for nausea or vomiting. 30 tablet 0   pyridOXINE (VITAMIN B-6) 100 MG tablet Take 100 mg by mouth daily.     riboflavin (VITAMIN B-2) 100 MG TABS tablet Take 100 mg by mouth daily.     simvastatin (ZOCOR) 40 MG tablet Take 1 tablet (40 mg total) by mouth 4 (four) times a week. (at bedtime) 48 tablet 1   Turmeric 500 MG TABS Take 750 mg by mouth 2 (two) times daily.     No current facility-administered medications on file prior to visit.    Social History   Tobacco Use   Smoking status: Never Smoker   Smokeless tobacco: Never Used  Vaping Use   Vaping Use: Never used  Substance Use Topics   Alcohol use: No   Drug use: No    Review of Systems  Constitutional: Negative for chills and fever.  Respiratory: Negative for cough.   Cardiovascular: Negative for chest pain and palpitations.  Gastrointestinal: Negative for nausea and vomiting.  Musculoskeletal: Positive for arthralgias (right wrist) and  back pain (chronic).  Skin: Negative for rash and wound.  Neurological: Negative for numbness.      Objective:    BP (!) 140/56    Pulse (!) 59    Temp 97.6 F (36.4 C)    Ht 5\' 2"  (1.575 m)    Wt 137 lb 9.6 oz (62.4 kg)    SpO2 97%    BMI 25.17 kg/m    Physical Exam Vitals reviewed.  Constitutional:      Appearance: She is well-developed  and well-nourished.  Eyes:     Conjunctiva/sclera: Conjunctivae normal.  Cardiovascular:     Rate and Rhythm: Normal rate and regular rhythm.     Pulses: Normal pulses.     Heart sounds: Normal heart sounds.  Pulmonary:     Effort: Pulmonary effort is normal.     Breath sounds: Normal breath sounds. No wheezing, rhonchi or rales.  Musculoskeletal:     Right elbow: Normal. No swelling. Normal range of motion. No tenderness.     Left elbow: Normal. No swelling. Normal range of motion. No tenderness.     Right forearm: Normal. No swelling.     Left forearm: Normal. No swelling.     Right wrist: Tenderness present. No swelling or deformity. Decreased range of motion. Normal pulse.     Left wrist: No swelling, deformity or effusion. Normal range of motion. Normal pulse.     Right hand: Tenderness present. No swelling. Normal strength. Normal sensation.     Left hand: Tenderness present. No swelling. Normal strength. Normal sensation.     Comments: Grip strength normal. Palpable radial pulses and sensation intact.  Right wrist No pain or limited ROM with  okay sign. Tenderness of CMC.No tenderness or bony step off along ulnar or radial border of wrist.  Pain with resisted wrist dorsiflexion. Unable to fully clasp right fingers around to make fist as she is able to do with left hand.  No erythema, increased warmth, edema or rash. Skin intact.    Skin:    General: Skin is warm and dry.  Neurological:     Mental Status: She is alert.  Psychiatric:        Mood and Affect: Mood and affect normal.        Speech: Speech normal.        Behavior:  Behavior normal.        Thought Content: Thought content normal.        Assessment & Plan:   Problem List Items Addressed This Visit      Musculoskeletal and Integument   Primary osteoarthritis of left hip   Relevant Medications   Acetaminophen-Codeine 300-30 MG tablet     Other   Right wrist pain - Primary    Acute onset. Presentation isnt consistent with gout, infection. Pending labs, xray. Patient well appearing nontoxic. Discussed osteoarthritis. Agreed conservative management with heat or ice, as well as topical therapy with topical lidocaine, biofreeze or limited use of voltaren  Gel. She will let me know she is doing.      Relevant Orders   C-reactive protein   Sedimentation rate   DG Wrist Complete Right   CBC with Differential/Platelet   Uric acid        I have changed Eshaal M. Palau's Acetaminophen-Codeine. I am also having her maintain her pyridOXINE, riboflavin, glucagon, docusate sodium, CRANBERRY PO, albuterol, Vitamin D, Turmeric, ondansetron, Lantus SoloStar, omeprazole, cyanocobalamin, gabapentin, lisinopril, Bariatric Rollator, FreeStyle Libre 14 Day Sensor, ascorbic acid, simvastatin, Insulin Pen Needle, insulin lispro, levothyroxine, ferrous sulfate, and amitriptyline.   Meds ordered this encounter  Medications   Acetaminophen-Codeine 300-30 MG tablet    Sig: Take 1 tablet by mouth every 12 (twelve) hours as needed for pain (moderate to severe pain).    Dispense:  60 tablet    Refill:  1    Not to exceed 4 additional fills before 05/04/2020 DX Code Needed  .    Order Specific Question:   Supervising Provider  Answer:   Sherlene Shams [[id number]]    Return precautions given.   Risks, benefits, and alternatives of the medications and treatment plan prescribed today were discussed, and patient expressed understanding.   Education regarding symptom management and diagnosis given to patient on AVS.  Continue to follow with Allegra Grana, FNP for  routine health maintenance.   Amiley [facility] and I agreed with plan.   Rennie Plowman, FNP

## 2020-03-18 NOTE — Patient Instructions (Addendum)
We will look at labs, xray today If the above normal, I suspect arthritis playing a role.  Ice for 20 minutes 3 times per day; if heat is preferred and therapeutic, heat is fine as well  Topical voltaren gel in moderation as this is an NSAID and can affect kidney function  If your symptom do not improve, you may go to walk in orthopedic clinic. Information below:  [facility] [address]  Monday-Friday 8am-9pm Saturday and Sunday 9am- 9pm   [phone]

## 2020-03-18 NOTE — Telephone Encounter (Signed)
Call daughter, pt Ever so slight anemia, we will monitor.  I would recommend that she continues iron if she can. We can recheck in 3 months time and please ask daughter to remind me to draw iron labs at follow up as likely we can discontinue then as long as stable

## 2020-03-18 NOTE — Telephone Encounter (Signed)
I called and advised patient's daughter, [name] on below. She was agreeable to this & will check at f/u.

## 2020-03-18 NOTE — Progress Notes (Signed)
Amy Calhoun has called to cancel tylenol 3 at [facility] to [facility]

## 2020-03-19 LAB — CBC WITH DIFFERENTIAL/PLATELET
Absolute Monocytes: 656 cells/uL (ref 200–950)
Basophils Absolute: 16 cells/uL (ref 0–200)
Basophils Relative: 0.2 %
Eosinophils Absolute: 123 cells/uL (ref 15–500)
Eosinophils Relative: 1.5 %
HCT: 37.8 % (ref 35.0–45.0)
Hemoglobin: 12.7 g/dL (ref 11.7–15.5)
Lymphs Abs: 1829 cells/uL (ref 850–3900)
MCH: 30.1 pg (ref 27.0–33.0)
MCHC: 33.6 g/dL (ref 32.0–36.0)
MCV: 89.6 fL (ref 80.0–100.0)
MPV: 11.7 fL (ref 7.5–12.5)
Monocytes Relative: 8 %
Neutro Abs: 5576 cells/uL (ref 1500–7800)
Neutrophils Relative %: 68 %
Platelets: 210 10*3/uL (ref 140–400)
RBC: 4.22 10*6/uL (ref 3.80–5.10)
RDW: 11.7 % (ref 11.0–15.0)
Total Lymphocyte: 22.3 %
WBC: 8.2 10*3/uL (ref 3.8–10.8)

## 2020-03-19 LAB — URIC ACID: Uric Acid, Serum: 4.3 mg/dL (ref 2.5–7.0)

## 2020-03-19 LAB — C-REACTIVE PROTEIN: CRP: 2.4 mg/L (ref <8.0)

## 2020-03-19 LAB — SEDIMENTATION RATE: Sed Rate: 14 mm/h (ref 0–30)

## 2020-03-23 ENCOUNTER — Telehealth: Payer: Self-pay | Admitting: Family

## 2020-03-23 NOTE — Telephone Encounter (Signed)
-----   Message from Lourena Simmonds, RPH-CPP sent at 03/21/2020  9:59 AM EST ----- Was running a high A1c report. I know this patient follows w/ Dr. Lonzo Cloud, but looks like a complicated living/social situation and I wonder if she would benefit from chronic care management support for more education, check ins, etc. I work really well with Dr. Lonzo Cloud with some other complex patients.   Just wanted to pass along the thought!  Catie

## 2020-03-25 DIAGNOSIS — E785 Hyperlipidemia, unspecified: Secondary | ICD-10-CM

## 2020-03-25 DIAGNOSIS — I129 Hypertensive chronic kidney disease with stage 1 through stage 4 chronic kidney disease, or unspecified chronic kidney disease: Secondary | ICD-10-CM | POA: Diagnosis not present

## 2020-03-25 DIAGNOSIS — E1142 Type 2 diabetes mellitus with diabetic polyneuropathy: Secondary | ICD-10-CM | POA: Diagnosis not present

## 2020-03-25 DIAGNOSIS — Z794 Long term (current) use of insulin: Secondary | ICD-10-CM

## 2020-03-25 DIAGNOSIS — G8929 Other chronic pain: Secondary | ICD-10-CM

## 2020-03-25 DIAGNOSIS — K219 Gastro-esophageal reflux disease without esophagitis: Secondary | ICD-10-CM

## 2020-03-25 DIAGNOSIS — Z9181 History of falling: Secondary | ICD-10-CM

## 2020-03-25 DIAGNOSIS — M545 Low back pain, unspecified: Secondary | ICD-10-CM

## 2020-03-25 DIAGNOSIS — F1314 Sedative, hypnotic or anxiolytic abuse with sedative, hypnotic or anxiolytic-induced mood disorder: Secondary | ICD-10-CM

## 2020-03-25 DIAGNOSIS — N183 Chronic kidney disease, stage 3 unspecified: Secondary | ICD-10-CM | POA: Diagnosis not present

## 2020-03-25 DIAGNOSIS — E039 Hypothyroidism, unspecified: Secondary | ICD-10-CM

## 2020-03-25 DIAGNOSIS — E1122 Type 2 diabetes mellitus with diabetic chronic kidney disease: Secondary | ICD-10-CM | POA: Diagnosis not present

## 2020-03-31 ENCOUNTER — Telehealth: Payer: Self-pay | Admitting: Family

## 2020-03-31 NOTE — Telephone Encounter (Signed)
[facility] called to report that patient BP today was 150/70. No other symptoms

## 2020-03-31 NOTE — Telephone Encounter (Signed)
Since not a critical reading forwarding as a FYI.

## 2020-04-01 NOTE — Telephone Encounter (Signed)
BP Readings from Last 3 Encounters:  03/18/20 (!) 140/56  03/16/20 (!) 142/82  03/09/20 140/80   In our office BP has been well controlled.   Please call pt and advise BP appt if BP is consistently in 150/70

## 2020-04-01 NOTE — Telephone Encounter (Signed)
Just FYI I called patient's daughter [name] & she said that day her mom had PT in the morning. She usually does in the afternoon & it flustered her bc she is slow moving in the mornings. She said she thinks that caused her BP to be higher at the time. She checks regularly & systolic usually runs on the 130's. She will let us know if any changes or BP starts consistently running higher.

## 2020-04-14 ENCOUNTER — Telehealth: Payer: Self-pay | Admitting: Family

## 2020-04-14 NOTE — Telephone Encounter (Signed)
FYI

## 2020-04-14 NOTE — Telephone Encounter (Signed)
Physical therapist called in to let Amy Calhoun know that patient missed her visit today she was not at home

## 2020-04-22 ENCOUNTER — Other Ambulatory Visit: Payer: Self-pay

## 2020-04-22 MED ORDER — GABAPENTIN 100 MG PO CAPS: 200.0000 mg | ORAL_CAPSULE | Freq: Two times a day (BID) | ORAL | 3 refills | Status: DC

## 2020-05-18 ENCOUNTER — Telehealth: Payer: Self-pay | Admitting: Family

## 2020-05-18 ENCOUNTER — Other Ambulatory Visit: Payer: Self-pay | Admitting: Family

## 2020-05-18 DIAGNOSIS — M1612 Unilateral primary osteoarthritis, left hip: Secondary | ICD-10-CM

## 2020-05-18 NOTE — Telephone Encounter (Signed)
Last Seen:03-18-20 Last ordered:03-18-20

## 2020-05-18 NOTE — Telephone Encounter (Signed)
Are you okay with my changing patients prescription for iron bc she takes every other day. She does not take daily & keeps getting too quickly bc [facility] automatically refills. Pt scheduled today f/u 6/15 with you.

## 2020-05-18 NOTE — Telephone Encounter (Signed)
Call pt I refilled tylenol #3 sch 3 month f/u 3 months from last visit

## 2020-05-20 NOTE — Addendum Note (Signed)
Addended by: Allegra Grana on: 05/20/2020 12:45 PM   Modules accepted: Orders

## 2020-05-20 NOTE — Telephone Encounter (Signed)
Call pt Anemia resolved per last lab 3/22  I have dced iron all together; she can focus on healthy diet which includes iron sources

## 2020-05-20 NOTE — Telephone Encounter (Signed)
LM with daughter [name] that patient could stop taking the iron & that Claris Che had d/c off her chart. I stated that anemia had resolved. I asked that she call back if any questions.

## 2020-05-26 ENCOUNTER — Ambulatory Visit (INDEPENDENT_AMBULATORY_CARE_PROVIDER_SITE_OTHER): Payer: Medicare HMO | Admitting: Internal Medicine

## 2020-05-26 ENCOUNTER — Other Ambulatory Visit: Payer: Self-pay

## 2020-05-26 ENCOUNTER — Encounter: Payer: Self-pay | Admitting: Internal Medicine

## 2020-05-26 VITALS — Temp 97.8°F | Ht 62.0 in | Wt 136.0 lb

## 2020-05-26 DIAGNOSIS — R2689 Other abnormalities of gait and mobility: Secondary | ICD-10-CM | POA: Diagnosis not present

## 2020-05-26 DIAGNOSIS — R42 Dizziness and giddiness: Secondary | ICD-10-CM | POA: Diagnosis not present

## 2020-05-26 DIAGNOSIS — R269 Unspecified abnormalities of gait and mobility: Secondary | ICD-10-CM

## 2020-05-26 DIAGNOSIS — S91302A Unspecified open wound, left foot, initial encounter: Secondary | ICD-10-CM

## 2020-05-26 DIAGNOSIS — S91309A Unspecified open wound, unspecified foot, initial encounter: Secondary | ICD-10-CM

## 2020-05-26 DIAGNOSIS — R296 Repeated falls: Secondary | ICD-10-CM

## 2020-05-26 DIAGNOSIS — N3000 Acute cystitis without hematuria: Secondary | ICD-10-CM

## 2020-05-26 DIAGNOSIS — E611 Iron deficiency: Secondary | ICD-10-CM

## 2020-05-26 MED ORDER — IRON (FERROUS SULFATE) 325 (65 FE) MG PO TABS: 325.0000 mg | ORAL_TABLET | ORAL | 2 refills | Status: DC

## 2020-05-26 NOTE — Patient Instructions (Addendum)
Do no take lisinopril 2.5 mg daily  prevagen for memory  Multivitamin for women centrum or nature made   [facility] [place] [address] [phone]   Fall Prevention in the Home, Adult Falls can cause injuries and can affect people from all age groups. There are many simple things that you can do to make your home safe and to help prevent falls. Ask for help when making these changes, if needed. What actions can I take to prevent falls? General instructions  Use good lighting in all rooms. Replace any light bulbs that burn out.  Turn on lights if it is dark. Use night-lights.  Place frequently used items in easy-to-reach places. Lower the shelves around your home if necessary.  Set up furniture so that there are clear paths around it. Avoid moving your furniture around.  Remove throw rugs and other tripping hazards from the floor.  Avoid walking on wet floors.  Fix any uneven floor surfaces.  Add color or contrast paint or tape to grab bars and handrails in your home. Place contrasting color strips on the first and last steps of stairways.  When you use a stepladder, make sure that it is completely opened and that the sides are firmly locked. Have someone hold the ladder while you are using it. Do not climb a closed stepladder.  Be aware of any and all pets. What can I do in the bathroom?  Keep the floor dry. Immediately clean up any water that spills onto the floor.  Remove soap buildup in the tub or shower on a regular basis.  Use non-skid mats or decals on the floor of the tub or shower.  Attach bath mats securely with double-sided, non-slip rug tape.  If you need to sit down while you are in the shower, use a plastic, non-slip stool.  Install grab bars by the toilet and in the tub and shower. Do not use towel bars as grab bars.      What can I do in the bedroom?  Make sure that a bedside light is easy to reach.  Do not use oversized bedding that  drapes onto the floor.  Have a firm chair that has side arms to use for getting dressed. What can I do in the kitchen?  Clean up any spills right away.  If you need to reach for something above you, use a sturdy step stool that has a grab bar.  Keep electrical cables out of the way.  Do not use floor polish or wax that makes floors slippery. If you must use wax, make sure that it is non-skid floor wax. What can I do in the stairways?  Do not leave any items on the stairs.  Make sure that you have a light switch at the top of the stairs and the bottom of the stairs. Have them installed if you do not have them.  Make sure that there are handrails on both sides of the stairs. Fix handrails that are broken or loose. Make sure that handrails are as long as the stairways.  Install non-slip stair treads on all stairs in your home.  Avoid having throw rugs at the top or bottom of stairways, or secure the rugs with carpet tape to prevent them from moving.  Choose a carpet design that does not hide the edge of steps on the stairway.  Check any carpeting to make sure that it is firmly attached to the stairs. Fix any carpet that  is loose or worn. What can I do on the outside of my home?  Use bright outdoor lighting.  Regularly repair the edges of walkways and driveways and fix any cracks.  Remove high doorway thresholds.  Trim any shrubbery on the main path into your home.  Regularly check that handrails are securely fastened and in good repair. Both sides of any steps should have handrails.  Install guardrails along the edges of any raised decks or porches.  Clear walkways of debris and clutter, including tools and rocks.  Have leaves, snow, and ice cleared regularly.  Use sand or salt on walkways during winter months.  In the garage, clean up any spills right away, including grease or oil spills. What other actions can I take?  Wear closed-toe shoes that fit well and support  your feet. Wear shoes that have rubber soles or low heels.  Use mobility aids as needed, such as canes, walkers, scooters, and crutches.  Review your medicines with your health care provider. Some medicines can cause dizziness or changes in blood pressure, which increase your risk of falling. Talk with your health care provider about other ways that you can decrease your risk of falls. This may include working with a physical therapist or trainer to improve your strength, balance, and endurance. Where to find more information  Centers for Disease Control and Prevention, [employer]: TVDivision.uy  [employer] on Aging: RingConnections.si Contact a health care provider if:  You are afraid of falling at home.  You feel weak, drowsy, or dizzy at home.  You fall at home. Summary  There are many simple things that you can do to make your home safe and to help prevent falls.  Ways to make your home safe include removing tripping hazards and installing grab bars in the bathroom.  Ask for help when making these changes in your home. This information is not intended to replace advice given to you by your health care provider. Make sure you discuss any questions you have with your health care provider. Document Revised: 12/14/2016 Document Reviewed: 08/16/2016 Elsevier Patient Education  2021 Elsevier Inc.   Dizziness Dizziness is a common problem. It is a feeling of unsteadiness or light-headedness. You may feel like you are about to faint. Dizziness can lead to injury if you stumble or fall. Anyone can become dizzy, but dizziness is more common in older adults. This condition can be caused by a number of things, including medicines, dehydration, or illness. Follow these instructions at home: Eating and drinking  Drink enough fluid to keep your urine clear or pale yellow. This helps to keep you from becoming dehydrated. Try to drink more clear fluids, such as water.  Do  not drink alcohol.  Limit your caffeine intake if told to do so by your health care provider. Check ingredients and nutrition facts to see if a food or beverage contains caffeine.  Limit your salt (sodium) intake if told to do so by your health care provider. Check ingredients and nutrition facts to see if a food or beverage contains sodium. Activity  Avoid making quick movements. ? Rise slowly from chairs and steady yourself until you feel okay. ? In the morning, first sit up on the side of the bed. When you feel okay, stand slowly while you hold onto something until you know that your balance is fine.  If you need to stand in one place for a long time, move your legs often. Tighten and relax the muscles in  your legs while you are standing.  Do not drive or use heavy machinery if you feel dizzy.  Avoid bending down if you feel dizzy. Place items in your home so that they are easy for you to reach without leaning over. Lifestyle  Do not use any products that contain nicotine or tobacco, such as cigarettes and e-cigarettes. If you need help quitting, ask your health care provider.  Try to reduce your stress level by using methods such as yoga or meditation. Talk with your health care provider if you need help to manage your stress. General instructions  Watch your dizziness for any changes.  Take over-the-counter and prescription medicines only as told by your health care provider. Talk with your health care provider if you think that your dizziness is caused by a medicine that you are taking.  Tell a friend or a family member that you are feeling dizzy. If he or she notices any changes in your behavior, have this person call your health care provider.  Keep all follow-up visits as told by your health care provider. This is important. Contact a health care provider if:  Your dizziness does not go away.  Your dizziness or light-headedness gets worse.  You feel nauseous.  You have  reduced hearing.  You have new symptoms.  You are unsteady on your feet or you feel like the room is spinning. Get help right away if:  You vomit or have diarrhea and are unable to eat or drink anything.  You have problems talking, walking, swallowing, or using your arms, hands, or legs.  You feel generally weak.  You are not thinking clearly or you have trouble forming sentences. It may take a friend or family member to notice this.  You have chest pain, abdominal pain, shortness of breath, or sweating.  Your vision changes.  You have any bleeding.  You have a severe headache.  You have neck pain or a stiff neck.  You have a fever. These symptoms may represent a serious problem that is an emergency. Do not wait to see if the symptoms will go away. Get medical help right away. Call your local emergency services (911 in the [place] Do not drive yourself to the hospital. Summary  Dizziness is a feeling of unsteadiness or light-headedness. This condition can be caused by a number of things, including medicines, dehydration, or illness.  Anyone can become dizzy, but dizziness is more common in older adults.  Drink enough fluid to keep your urine clear or pale yellow. Do not drink alcohol.  Avoid making quick movements if you feel dizzy. Monitor your dizziness for any changes. This information is not intended to replace advice given to you by your health care provider. Make sure you discuss any questions you have with your health care provider. Document Revised: 01/04/2017 Document Reviewed: 02/04/2016 Elsevier Patient Education  2021 [employer].

## 2020-05-26 NOTE — Progress Notes (Signed)
Chief Complaint  Patient presents with  . Dizziness  . Balance issues  . Foot Injury    Sore on the left foot   F/u with daughter emma rose  1. Dizziness and loss of balance more in the am orthostatics checked lying down 124/76 hr 69, sitting 120/64 and was dizzy/lightheaded hr was 70, standing 108/54 hr 65 negative orthostatics  She has h/o falls and has fallend 1-2 x this month and done PT before now [facility] and daughter asking if PT can come in the home there. Pt already walks with cane, walker and has a scooter  2. Left foot ulcer painful 5/10 will refer to pain clinic 3. Low normal BP daughter already holding lis 2.5 mg qd and only takes if BP >140/>90    Review of Systems  Constitutional: Negative for weight loss.  HENT: Positive for hearing loss.   Eyes: Negative for blurred vision.  Respiratory: Negative for shortness of breath.   Cardiovascular: Negative for chest pain.  Musculoskeletal: Positive for falls.  Skin: Negative for rash.  Neurological: Positive for dizziness.  Psychiatric/Behavioral: Positive for memory loss.   Past Medical History:  Diagnosis Date  . Allergy   . Ambulates with cane   . Anemia   . At high risk for falls   . Cataract    removed both eyes  . Chronic kidney disease 08/07/2016   Chronic renal failure, stage III  . Depression   . Esophageal dysphagia   . Gastric ulcer   . GERD (gastroesophageal reflux disease)   . Hyperlipidemia   . Hypertension   . Hypothyroidism   . IDDM (insulin dependent diabetes mellitus)   . Ischemic colitis (HCC)   . Osteoarthritis   . Ulcer of esophagus    Past Surgical History:  Procedure Laterality Date  . ABDOMINAL HYSTERECTOMY    . CATARACT EXTRACTION, BILATERAL    . CHOLECYSTECTOMY    . COLONOSCOPY  2010  . ESOPHAGEAL DILATION  08/07/2016   usually a couple times a year, last time 07/30/16  . GALLBLADDER SURGERY    . LUMBAR LAMINECTOMY/DECOMPRESSION  MICRODISCECTOMY N/A 08/10/2016   Procedure: BILATERAL HEMILAMINECTOMY LUMBAR THREE-FOUR,LUMBAR FOUR-FIVE,LEFT LUMBAR FIVE-SACRAL ONE HEMILAMINECTOMY AND DECOMPRESSION;  Surgeon: Coletta Memosabbell, Kyle, MD;  Location: [facility] OR;  Service: Neurosurgery;  Laterality: N/A;  . THYROIDECTOMY    . UPPER GASTROINTESTINAL ENDOSCOPY     Family History  Problem Relation Age of Onset  . Diabetes Mother   . Arthritis Mother   . Hyperlipidemia Mother   . Mental illness Mother   . Heart disease Father   . Mental illness Sister   . Arthritis Maternal Grandmother   . Arthritis Maternal Grandfather   . Colon cancer Neg Hx   . Stomach cancer Neg Hx   . Esophageal cancer Neg Hx   . Rectal cancer Neg Hx   . Colon polyps Neg Hx    Social History   Socioeconomic History  . Marital status: Widowed    Spouse name: Not on file  . Number of children: Not on file  . Years of education: Not on file  . Highest education level: Not on file  Occupational History  . Occupation: retired  Tobacco Use  . Smoking status: Never Smoker  . Smokeless tobacco: Never Used  Vaping Use  . Vaping Use: Never used  Substance and Sexual Activity  . Alcohol use: No  . Drug use: No  .  Sexual activity: Not Currently    Partners: Male  Other Topics Concern  . Not on file  Social History Narrative   [facility] senior complex, independent living.    Provide her with cleaning, one meal per day.    Social Determinants of Health   Financial Resource Strain: Low Risk   . Difficulty of Paying Living Expenses: Not hard at all  Food Insecurity: No Food Insecurity  . Worried About Programme researcher, broadcasting/film/video in the Last Year: Never true  . Ran Out of Food in the Last Year: Never true  Transportation Needs: No Transportation Needs  . Lack of Transportation (Medical): No  . Lack of Transportation (Non-Medical): No  Physical Activity: Not on file  Stress: No Stress Concern Present  . Feeling of Stress : Not at all  Social Connections: Not on  file  Intimate Partner Violence: Not on file   Current Meds  Medication Sig  . Acetaminophen-Codeine 300-30 MG tablet TAKE 1 TABLET BY MOUTH EVERY 12 (TWELVE) HOURS AS NEEDED FOR PAIN (MODERATE TO SEVERE PAIN).  Marland Kitchen albuterol (PROVENTIL HFA;VENTOLIN HFA) 108 (90 Base) MCG/ACT inhaler Inhale 2 puffs into the lungs every 6 (six) hours as needed for wheezing or shortness of breath.  Marland Kitchen amitriptyline (ELAVIL) 75 MG tablet TAKE 2 TABLETS AT BEDTIME  . ascorbic acid (VITAMIN C) 500 MG tablet Take 500 mg by mouth daily.  . Cholecalciferol (VITAMIN D) 2000 units tablet Take 2,000 Units by mouth daily.   . Continuous Blood Gluc Sensor (FREESTYLE LIBRE 14 DAY SENSOR) MISC 1 Package by Does not apply route every 14 (fourteen) days.  Marland Kitchen CRANBERRY PO Take 1 tablet by mouth every Monday, Wednesday, and Friday.  . cyanocobalamin (,VITAMIN B-12,) 1000 MCG/ML injection INJECT WEEKLY FOR 4 WEEKS THEN INJECT MONTHLY (Patient taking differently: MONTHLY)  . docusate sodium (COLACE) 100 MG capsule Take 1 capsule (100 mg total) by mouth 2 (two) times daily. (Patient taking differently: Take 100 mg by mouth every other day.)  . gabapentin (NEURONTIN) 100 MG capsule Take 2 capsules (200 mg total) by mouth 2 (two) times daily.  Marland Kitchen glucagon (GLUCAGON EMERGENCY) 1 MG injection Inject 1 mg into the vein once as needed. For hypoglycemic episodes. E11.42  . Insulin Glargine (LANTUS SOLOSTAR) 100 UNIT/ML Solostar Pen Inject 8 Units into the skin at bedtime.  . insulin lispro (HUMALOG KWIKPEN) 100 UNIT/ML KwikPen Inject 6 Units into the skin 3 (three) times daily.  . Insulin Pen Needle 32G X 4 MM MISC 1 Device by Does not apply route in the morning, at noon, in the evening, and at bedtime.  . Iron, Ferrous Sulfate, 325 (65 Fe) MG TABS Take 325 mg by mouth every other day.  . levothyroxine (SYNTHROID) 150 MCG tablet TAKE 1 TABLET DAILY  . Misc. Devices (BARIATRIC ROLLATOR) MISC Use as needed  . omeprazole (PRILOSEC) 40  MG capsule TAKE 1 CAPSULE TWICE A DAY  . ondansetron (ZOFRAN ODT) 4 MG disintegrating tablet Take 1 tablet (4 mg total) by mouth every 8 (eight) hours as needed for nausea or vomiting.  . pyridOXINE (VITAMIN B-6) 100 MG tablet Take 100 mg by mouth daily.  . riboflavin (VITAMIN B-2) 100 MG TABS tablet Take 100 mg by mouth daily.  . simvastatin (ZOCOR) 40 MG tablet Take 1 tablet (40 mg total) by mouth 4 (four) times a week. (at bedtime)  . Turmeric 500 MG TABS Take 750 mg by mouth 2 (two) times daily.   Allergies  Allergen Reactions  . Pravachol [Pravastatin] Other (See Comments)    States she refused due to side effects   Recent Results (from the past 2160 hour(s))  CBC with Differential/Platelet     Status: Abnormal   Collection Time: 03/09/20  2:04 PM  Result Value Ref Range   WBC 6.9 4.0 - 10.5 K/uL   RBC 3.93 3.87 - 5.11 Mil/uL   Hemoglobin 11.6 (L) 12.0 - 15.0 g/dL   HCT 34.1 93.7 - 90.2 %   MCV 92.0 78.0 - 100.0 fl   MCHC 32.2 30.0 - 36.0 g/dL   RDW 40.9 73.5 - 32.9 %   Platelets 209.0 150.0 - 400.0 K/uL   Neutrophils Relative % 67.5 43.0 - 77.0 %   Lymphocytes Relative 22.3 12.0 - 46.0 %   Monocytes Relative 8.0 3.0 - 12.0 %   Eosinophils Relative 1.5 0.0 - 5.0 %   Basophils Relative 0.7 0.0 - 3.0 %   Neutro Abs 4.6 1.4 - 7.7 K/uL   Lymphs Abs 1.5 0.7 - 4.0 K/uL   Monocytes Absolute 0.5 0.1 - 1.0 K/uL   Eosinophils Absolute 0.1 0.0 - 0.7 K/uL   Basophils Absolute 0.1 0.0 - 0.1 K/uL  Comprehensive metabolic panel     Status: Abnormal   Collection Time: 03/09/20  2:04 PM  Result Value Ref Range   Sodium 133 (L) 135 - 145 mEq/L   Potassium 5.2 (H) 3.5 - 5.1 mEq/L   Chloride 95 (L) 96 - 112 mEq/L   CO2 31 19 - 32 mEq/L   Glucose, Bld 549 (HH) 70 - 99 mg/dL   BUN 32 (H) 6 - 23 mg/dL   Creatinine, Ser 9.24 0.40 - 1.20 mg/dL   Total Bilirubin 0.6 0.2 - 1.2 mg/dL   Alkaline Phosphatase 99 39 - 117 U/L   AST 14 0 - 37 U/L   ALT 14 0 - 35 U/L   Total Protein 6.7 6.0 - 8.3  g/dL   Albumin 4.1 3.5 - 5.2 g/dL   GFR 26.83 (L) >41.96 mL/min    Comment: Calculated using the CKD-EPI Creatinine Equation (2021)   Calcium 8.6 8.4 - 10.5 mg/dL  Microalbumin / creatinine urine ratio     Status: None   Collection Time: 03/09/20  2:13 PM  Result Value Ref Range   Microalb, Ur 1.8 0.0 - 1.9 mg/dL   Creatinine,U 22.2 mg/dL   Microalb Creat Ratio 4.5 0.0 - 30.0 mg/g  POCT HgB A1C     Status: Abnormal   Collection Time: 03/16/20  2:14 PM  Result Value Ref Range   Hemoglobin A1C 8.6 (A) 4.0 - 5.6 %   HbA1c POC (<> result, manual entry)     HbA1c, POC (prediabetic range)     HbA1c, POC (controlled diabetic range)    Basic metabolic panel     Status: Abnormal   Collection Time: 03/16/20  2:41 PM  Result Value Ref Range   Sodium 134 (L) 135 - 145 mEq/L   Potassium 4.8 3.5 - 5.1 mEq/L   Chloride 98 96 - 112 mEq/L   CO2 31 19 - 32 mEq/L   Glucose, Bld 259 (H) 70 - 99 mg/dL   BUN 28 (H) 6 - 23 mg/dL   Creatinine, Ser 9.79 0.40 - 1.20 mg/dL   GFR 89.21 (L) >19.41 mL/min    Comment: Calculated using the CKD-EPI Creatinine Equation (2021)   Calcium 8.8 8.4 - 10.5 mg/dL  C-reactive protein     Status: None  Collection Time: 03/18/20  2:51 PM  Result Value Ref Range   CRP 2.4 <8.0 mg/L  Sedimentation rate     Status: None   Collection Time: 03/18/20  2:51 PM  Result Value Ref Range   Sed Rate 14 0 - 30 mm/h  CBC with Differential/Platelet     Status: None   Collection Time: 03/18/20  2:51 PM  Result Value Ref Range   WBC 8.2 3.8 - 10.8 Thousand/uL   RBC 4.22 3.80 - 5.10 Million/uL   Hemoglobin 12.7 11.7 - 15.5 g/dL   HCT 40.9 81.1 - 91.4 %   MCV 89.6 80.0 - 100.0 fL   MCH 30.1 27.0 - 33.0 pg   MCHC 33.6 32.0 - 36.0 g/dL   RDW 78.2 95.6 - 21.3 %   Platelets 210 140 - 400 Thousand/uL   MPV 11.7 7.5 - 12.5 fL   Neutro Abs 5,576 1,500 - 7,800 cells/uL   Lymphs Abs 1,829 850 - 3,900 cells/uL   Absolute Monocytes 656 200 - 950 cells/uL   Eosinophils Absolute 123 15  - 500 cells/uL   Basophils Absolute 16 0 - 200 cells/uL   Neutrophils Relative % 68 %   Total Lymphocyte 22.3 %   Monocytes Relative 8.0 %   Eosinophils Relative 1.5 %   Basophils Relative 0.2 %  Uric acid     Status: None   Collection Time: 03/18/20  3:02 PM  Result Value Ref Range   Uric Acid, Serum 4.3 2.5 - 7.0 mg/dL    Comment: Therapeutic target for gout patients: <6.0 mg/dL .    Objective  Body mass index is 24.87 kg/m. Wt Readings from Last 3 Encounters:  05/26/20 136 lb (61.7 kg)  03/18/20 137 lb 9.6 oz (62.4 kg)  03/16/20 138 lb 2 oz (62.7 kg)   Temp Readings from Last 3 Encounters:  05/26/20 97.8 F (36.6 C) (Oral)  03/18/20 97.6 F (36.4 C)  03/09/20 97.8 F (36.6 C) (Oral)   BP Readings from Last 3 Encounters:  03/18/20 (!) 140/56  03/16/20 (!) 142/82  03/09/20 140/80   Pulse Readings from Last 3 Encounters:  03/18/20 (!) 59  03/16/20 60  03/09/20 80    Physical Exam Vitals and nursing note reviewed.  Constitutional:      Appearance: Normal appearance. She is well-developed and well-groomed.  HENT:     Head: Normocephalic and atraumatic.  Cardiovascular:     Rate and Rhythm: Normal rate and regular rhythm.     Heart sounds: Normal heart sounds. No murmur heard.   Pulmonary:     Effort: Pulmonary effort is normal.     Breath sounds: Normal breath sounds.  Skin:    General: Skin is warm and dry.       Neurological:     General: No focal deficit present.     Mental Status: She is alert and oriented to person, place, and time. Mental status is at baseline.     Gait: Gait abnormal.  Psychiatric:        Attention and Perception: Attention and perception normal.        Mood and Affect: Mood and affect normal.        Speech: Speech normal.        Behavior: Behavior normal. Behavior is cooperative.        Thought Content: Thought content normal.        Cognition and Memory: Cognition and memory normal.        Judgment:  Judgment normal.      Assessment  Plan  Abnormal gait orthostatics negative today could be physical deconditioning r/o stroke - Plan: CT Head Wo Contrast Dizzinessr/o stroke - Plan: CT Head Wo Contrast Balance problem - Plan: CT Head Wo Contrast Recurrent falls - Plan: CT Head Wo Contrast Rx PT on Rx for [facility]   Non-healing open wound of heel, initial encounter - Plan: Ambulatory referral to Wound Clinic Non-healing open wound of left heel, initial encounter - Plan: Ambulatory referral to Wound Clinic  Iron deficiency - Plan: Iron, Ferrous Sulfate, 325 (65 Fe) MG TABS  Acute cystitis without hematuria - Plan: Urinalysis, Routine w reflex microscopic, Urine Culture  Daughter to bring back urine to r/o UTI   Provider: Dr. French Ana McLean-Scocuzza-Internal Medicine

## 2020-06-02 ENCOUNTER — Other Ambulatory Visit: Payer: Self-pay

## 2020-06-02 ENCOUNTER — Encounter: Payer: Medicare HMO | Attending: Physician Assistant | Admitting: Physician Assistant

## 2020-06-02 DIAGNOSIS — E11621 Type 2 diabetes mellitus with foot ulcer: Secondary | ICD-10-CM | POA: Diagnosis not present

## 2020-06-02 DIAGNOSIS — L97429 Non-pressure chronic ulcer of left heel and midfoot with unspecified severity: Secondary | ICD-10-CM | POA: Diagnosis present

## 2020-06-02 DIAGNOSIS — R262 Difficulty in walking, not elsewhere classified: Secondary | ICD-10-CM | POA: Insufficient documentation

## 2020-06-02 DIAGNOSIS — L97422 Non-pressure chronic ulcer of left heel and midfoot with fat layer exposed: Secondary | ICD-10-CM | POA: Insufficient documentation

## 2020-06-02 NOTE — Progress Notes (Addendum)
MYRLENE, RIERA ([id number]) Visit Report for 06/02/2020 Allergy List Details Patient Name: Amy Calhoun, Amy Calhoun. Date of Service: 06/02/2020 12:45 PM Medical Record Number: [id number] Patient Account Number: [id number] Date of Birth/Sex: 1928/04/23 (87 y.o. F) Treating RN: Hansel Feinstein Primary Care Khai Torbert: Rennie Plowman Other Clinician: Referring Amanat Hackel: McLean-Scocuzza, French Ana Treating Jessie Schrieber/Extender: Allen Derry Weeks in Treatment: 0 Allergies Active Allergies pravastatin Reaction: gi Severity: Mild Allergy Notes Electronic Signature(s) Signed: 06/02/2020 3:56:48 PM By: Hansel Feinstein Entered By: Hansel Feinstein on 06/02/2020 13:08:41 Pigue, Dyanne Carrel ([id number]) -------------------------------------------------------------------------------- Arrival Information Details Patient Name: Amy Calhoun. Date of Service: 06/02/2020 12:45 PM Medical Record Number: [id number] Patient Account Number: [id number] Date of Birth/Sex: [birth year] (87 y.o. F) Treating RN: Hansel Feinstein Primary Care Kenlee Maler: Rennie Plowman Other Clinician: Referring Kassadi Presswood: McLean-Scocuzza, French Ana Treating Adelfa Lozito/Extender: Rowan Blase in Treatment: 0 Visit Information Patient Arrived: Danella Maiers Time: 12:59 Accompanied By: daughter Transfer Assistance: EasyPivot Patient Lift Patient Identification Verified: Yes Secondary Verification Process Completed: Yes Patient Has Alerts: Yes Patient Alerts: DIABETIC Electronic Signature(s) Signed: 06/02/2020 3:56:48 PM By: Hansel Feinstein Entered By: Hansel Feinstein on 06/02/2020 13:05:10 Pedone, Dyanne Carrel ([id number]) -------------------------------------------------------------------------------- Clinic Level of Care Assessment Details Patient Name: Amy Calhoun. Date of Service: 06/02/2020 12:45 PM Medical Record Number: [id number] Patient Account Number: [id number] Date of Birth/Sex: 06/11/1928 (87 y.o. F) Treating RN: Rogers Blocker Primary Care Camrynn Mcclintic:  Rennie Plowman Other Clinician: Referring Erik Nessel: McLean-Scocuzza, French Ana Treating Daveda Larock/Extender: Rowan Blase in Treatment: 0 Clinic Level of Care Assessment Items TOOL 1 Quantity Score X - Use when EandM and Procedure is performed on INITIAL visit 1 0 ASSESSMENTS - Nursing Assessment / Reassessment X - General Physical Exam (combine w/ comprehensive assessment (listed just below) when performed on new 1 20 pt. evals) X- 1 25 Comprehensive Assessment (HX, ROS, Risk Assessments, Wounds Hx, etc.) ASSESSMENTS - Wound and Skin Assessment / Reassessment []  - Dermatologic / Skin Assessment (not related to wound area) 0 ASSESSMENTS - Ostomy and/or Continence Assessment and Care []  - Incontinence Assessment and Management 0 []  - 0 Ostomy Care Assessment and Management (repouching, etc.) PROCESS - Coordination of Care X - Simple Patient / Family Education for ongoing care 1 15 []  - 0 Complex (extensive) Patient / Family Education for ongoing care []  - 0 Staff obtains , Records, Test Results / Process Orders []  - 0 Staff telephones HHA, Nursing Homes / Clarify orders / etc []  - 0 Routine Transfer to another Facility (non-emergent condition) []  - 0 Routine Hospital Admission (non-emergent condition) []  - 0 New Admissions / / Ordering NPWT, Apligraf, etc. []  - 0 Emergency Hospital Admission (emergent condition) PROCESS - Special Needs []  - Pediatric / Minor Patient Management 0 []  - 0 Isolation Patient Management []  - 0 Hearing / Language / Visual special needs []  - 0 Assessment of Community assistance (transportation, D/C planning, etc.) []  - 0 Additional assistance / Altered mentation []  - 0 Support Surface(s) Assessment (bed, cushion, seat, etc.) INTERVENTIONS - Miscellaneous []  - External ear exam 0 []  - 0 Patient Transfer (multiple staff / / Similar devices) []  - 0 Simple Staple / Suture removal (25 or less) []  -  0 Complex Staple / Suture removal (26 or more) []  - 0 Hypo/Hyperglycemic Management (do not check if billed separately) X- 1 15 Ankle / Brachial Index (ABI) - do not check if billed separately Has the patient been seen at the hospital within the last three years: Yes Total Score: 75 Level Of Care:  New/Established - Level 2 HITOMI, SLAPE ([id number]) Electronic Signature(s) Signed: 06/02/2020 5:24:26 PM By: Phillis Haggis, Dondra Prader RN Entered By: Phillis Haggis, Dondra Prader on 06/02/2020 14:29:23 Amy Calhoun ([id number]) -------------------------------------------------------------------------------- Lower Extremity Assessment Details Patient Name: Amy Calhoun, Amy Calhoun. Date of Service: 06/02/2020 12:45 PM Medical Record Number: [id number] Patient Account Number: [id number] Date of Birth/Sex: 07-08-1928 (87 y.o. F) Treating RN: Hansel Feinstein Primary Care Sharmain Lastra: Rennie Plowman Other Clinician: Referring Elizzie Westergard: McLean-Scocuzza, French Ana Treating Christna Kulick/Extender: Rowan Blase in Treatment: 0 Edema Assessment Assessed: [Left: Yes] [Right: No] Edema: [Left: N] [Right: o] Calf Left: Right: Point of Measurement: 29 cm From Medial Instep 33 cm Ankle Left: Right: Point of Measurement: 10 cm From Medial Instep 20.7 cm Knee To Floor Left: Right: From Medial Instep 34 cm Vascular Assessment Pulses: Dorsalis Pedis Palpable: [Left:Yes] Blood Pressure: Brachial: [Left:150] Dorsalis Pedis: 50 Ankle: Posterior Tibial: 100 Ankle Brachial Index: [Left:0.67] Electronic Signature(s) Signed: 06/02/2020 3:56:48 PM By: Hansel Feinstein Entered ByHansel Feinstein on 06/02/2020 13:38:48 Godina, Jacklyn Judie Petit ([id number]) -------------------------------------------------------------------------------- Multi Wound Chart Details Patient Name: Amy Calhoun. Date of Service: 06/02/2020 12:45 PM Medical Record Number: [id number] Patient Account Number: [id number] Date of Birth/Sex: 12/14/1928 (87 y.o.  F) Treating RN: Rogers Blocker Primary Care Catriona Dillenbeck: Rennie Plowman Other Clinician: Referring Milissa Fesperman: McLean-Scocuzza, French Ana Treating Breland Trouten/Extender: Rowan Blase in Treatment: 0 Vital Signs Height(in): 62 Pulse(bpm): 68 Weight(lbs): 136 Blood Pressure(mmHg): 167/59 Body Mass Index(BMI): 25 Temperature(F): 98 Respiratory Rate(breaths/min): 18 Photos: [N/A:N/A] Wound Location: Left Calcaneus N/A N/A Wounding Event: Gradually Appeared N/A N/A Primary Etiology: Diabetic Wound/Ulcer of the Lower N/A N/A Extremity Comorbid History: Cataracts, Anemia, Type II N/A N/A Diabetes, Osteoarthritis Date Acquired: 05/09/2020 N/A N/A Weeks of Treatment: 0 N/A N/A Wound Status: Open N/A N/A Measurements L x W x D (cm) 0.4x0.4x0.1 N/A N/A Area (cm) : 0.126 N/A N/A Volume (cm) : 0.013 N/A N/A Classification: Grade 1 N/A N/A Exudate Amount: None Present N/A N/A Granulation Amount: Medium (34-66%) N/A N/A Necrotic Amount: Medium (34-66%) N/A N/A Exposed Structures: Fat Layer (Subcutaneous Tissue): N/A N/A Yes Fascia: No Tendon: No Muscle: No Joint: No Bone: No Treatment Notes Electronic Signature(s) Signed: 06/02/2020 5:24:26 PM By: Phillis Haggis, Dondra Prader RN Entered By: Phillis Haggis, Dondra Prader on 06/02/2020 14:24:20 Amy Calhoun ([id number]) -------------------------------------------------------------------------------- Multi-Disciplinary Care Plan Details Patient Name: Amy Calhoun. Date of Service: 06/02/2020 12:45 PM Medical Record Number: [id number] Patient Account Number: [id number] Date of Birth/Sex: 1928-05-23 (87 y.o. F) Treating RN: Rogers Blocker Primary Care Marvina Danner: Rennie Plowman Other Clinician: Referring Tymel Conely: McLean-Scocuzza, French Ana Treating Elsia Lasota/Extender: Rowan Blase in Treatment: 0 Active Inactive Electronic Signature(s) Signed: 06/17/2020 4:27:20 PM By: Elliot Gurney, BSN, RN, CWS, Kim RN, BSN Signed: 07/08/2020 4:29:37 PM By: Lajean Manes RN Previous Signature: 06/17/2020 4:26:51 PM Version By: Elliot Gurney BSN, RN, CWS, Kim RN, BSN Previous Signature: 06/02/2020 5:24:26 PM Version By: Phillis Haggis, Dondra Prader RN Entered By: Elliot Gurney, BSN, RN, CWS, Kim on 06/17/2020 16:27:19 Amy Calhoun ([id number]) -------------------------------------------------------------------------------- Pain Assessment Details Patient Name: Amy Calhoun, Amy Calhoun. Date of Service: 06/02/2020 12:45 PM Medical Record Number: [id number] Patient Account Number: [id number] Date of Birth/Sex: 1928-03-15 (87 y.o. F) Treating RN: Hansel Feinstein Primary Care Avory Rahimi: Rennie Plowman Other Clinician: Referring Michoel Kunin: McLean-Scocuzza, French Ana Treating Wilber Fini/Extender: Rowan Blase in Treatment: 0 Active Problems Location of Pain Severity and Description of Pain Patient Has Paino No Site Locations Rate the pain. Current Pain Level: 0 Pain Management and Medication Current Pain Management: Electronic Signature(s) Signed: 06/02/2020 3:56:48 PM By: Hansel Feinstein  Entered By: Hansel Feinstein on 06/02/2020 13:06:32 Amy Calhoun ([id number]) -------------------------------------------------------------------------------- Patient/Caregiver Education Details Patient Name: Amy Calhoun. Date of Service: 06/02/2020 12:45 PM Medical Record Number: [id number] Patient Account Number: [id number] Date of Birth/Gender: [birth year] (87 y.o. F) Treating RN: Rogers Blocker Primary Care Physician: Rennie Plowman Other Clinician: Referring Physician: McLean-Scocuzza, French Ana Treating Physician/Extender: Rowan Blase in Treatment: 0 Education Assessment Education Provided To: Patient Education Topics Provided [facility]: Methods: Explain/Verbal Responses: State content correctly Wound/Skin Impairment: Methods: Explain/Verbal Responses: State content correctly Electronic Signature(s) Signed: 06/02/2020 5:24:26 PM By: Phillis Haggis,  Dondra Prader RN Entered By: Phillis Haggis, Dondra Prader on 06/02/2020 14:37:22 Crute, Dyanne Carrel ([id number]) -------------------------------------------------------------------------------- Wound Assessment Details Patient Name: Amy Calhoun. Date of Service: 06/02/2020 12:45 PM Medical Record Number: [id number] Patient Account Number: [id number] Date of Birth/Sex: 1928-08-02 (87 y.o. F) Treating RN: Hansel Feinstein Primary Care Gigi Onstad: Rennie Plowman Other Clinician: Referring Latham Kinzler: McLean-Scocuzza, French Ana Treating Yarixa Lightcap/Extender: Rowan Blase in Treatment: 0 Wound Status Wound Number: 1 Primary Etiology: Diabetic Wound/Ulcer of the Lower Extremity Wound Location: Left Calcaneus Wound Status: Open Wounding Event: Gradually Appeared Comorbid History: Cataracts, Anemia, Type II Diabetes, Osteoarthritis Date Acquired: 05/09/2020 Weeks Of Treatment: 0 Clustered Wound: No Photos Wound Measurements Length: (cm) 0.4 Width: (cm) 0.4 Depth: (cm) 0.1 Area: (cm) 0.126 Volume: (cm) 0.013 % Reduction in Area: 0% % Reduction in Volume: 0% Epithelialization: None Tunneling: No Undermining: No Wound Description Classification: Grade 1 Exudate Amount: Medium Exudate Type: Serous Exudate Color: amber Foul Odor After Cleansing: No Slough/Fibrino Yes Wound Bed Granulation Amount: Medium (34-66%) Exposed Structure Necrotic Amount: Medium (34-66%) Fascia Exposed: No Necrotic Quality: Adherent Slough Fat Layer (Subcutaneous Tissue) Exposed: Yes Tendon Exposed: No Muscle Exposed: No Joint Exposed: No Bone Exposed: No Electronic Signature(s) Signed: 06/02/2020 3:56:48 PM By: Hansel Feinstein Signed: 06/02/2020 5:24:26 PM By: Phillis Haggis, Dondra Prader RN Entered By: Phillis Haggis, Dondra Prader on 06/02/2020 14:30:15 Eilts, Dyanne Carrel ([id number]) -------------------------------------------------------------------------------- Vitals Details Patient Name: Amy Calhoun. Date of Service:  06/02/2020 12:45 PM Medical Record Number: [id number] Patient Account Number: [id number] Date of Birth/Sex: 09-09-1928 (87 y.o. F) Treating RN: Hansel Feinstein Primary Care Rolande Moe: Rennie Plowman Other Clinician: Referring Hasnain Manheim: McLean-Scocuzza, French Ana Treating Daeja Helderman/Extender: Rowan Blase in Treatment: 0 Vital Signs Time Taken: 13:05 Temperature (F): 98 Height (in): 62 Pulse (bpm): 68 Source: Stated Respiratory Rate (breaths/min): 18 Weight (lbs): 136 Blood Pressure (mmHg): 167/59 Source: Measured Reference Range: 80 - 120 mg / dl Body Mass Index (BMI): 24.9 Electronic Signature(s) Signed: 06/02/2020 3:56:48 PM By: Hansel Feinstein Entered ByHansel Feinstein on 06/02/2020 13:08:13

## 2020-06-02 NOTE — Progress Notes (Signed)
Amy Calhoun, Amy Calhoun ([id number]) Visit Report for 06/02/2020 Abuse/Suicide Risk Screen Details Patient Name: Amy Calhoun, Amy Calhoun. Date of Service: 06/02/2020 12:45 PM Medical Record Number: [id number] Patient Account Number: [id number] Date of Birth/Sex: July 02, 1928 (87 y.o. F) Treating RN: Hansel Feinstein Primary Care Amy Calhoun: Amy Calhoun Other Clinician: Referring Amy Calhoun: Amy Calhoun Treating Amy Calhoun/Extender: Amy Calhoun in Treatment: 0 Abuse/Suicide Risk Screen Items Answer ABUSE RISK SCREEN: Has anyone close to you tried to hurt or harm you recentlyo No Do you feel uncomfortable with anyone in your familyo No Has anyone forced you do things that you didnot want to doo No Electronic Signature(s) Signed: 06/02/2020 3:56:48 PM By: Hansel Feinstein Entered By: Hansel Feinstein on 06/02/2020 13:12:56 Amy Calhoun ([id number]) -------------------------------------------------------------------------------- Activities of Daily Living Details Patient Name: Amy Calhoun. Date of Service: 06/02/2020 12:45 PM Medical Record Number: [id number] Patient Account Number: [id number] Date of Birth/Sex: 1928/07/10 (87 y.o. F) Treating RN: Hansel Feinstein Primary Care Kalif Kattner: Amy Calhoun Other Clinician: Referring Amy Calhoun: Amy Calhoun Treating Amy Calhoun/Extender: Amy Calhoun in Treatment: 0 Activities of Daily Living Items Answer Activities of Daily Living (Please select one for each item) Drive Automobile Not Able Take Medications Need Assistance Use Telephone Completely Able Care for Appearance Completely Able Use Toilet Completely Able Bath / Shower Completely Able Dress Self Completely Able Feed Self Completely Able Walk Completely Able Get In / Out Bed Completely Able Housework Need Assistance Prepare Meals Not Able Handle Money Need Assistance Shop for Self Need Assistance Electronic Signature(s) Signed: 06/02/2020 3:56:48 PM By: Hansel Feinstein Entered  By: Hansel Feinstein on 06/02/2020 13:10:08 Amy Calhoun ([id number]) -------------------------------------------------------------------------------- Education Screening Details Patient Name: Amy Calhoun. Date of Service: 06/02/2020 12:45 PM Medical Record Number: [id number] Patient Account Number: [id number] Date of Birth/Sex: February 20, 1928 (87 y.o. F) Treating RN: Hansel Feinstein Primary Care Shady Bradish: Amy Calhoun Other Clinician: Referring Zadaya Cuadra: Amy Calhoun Treating Amy Calhoun/Extender: Amy Calhoun in Treatment: 0 Primary Learner Assessed: Patient Learning Preferences/Education Level/Primary Language Learning Preference: Explanation Highest Education Level: High School Preferred Language: English Cognitive Barrier Language Barrier: No Translator Needed: No Memory Deficit: No Emotional Barrier: No Cultural/Religious Beliefs Affecting Medical Care: No Physical Barrier Impaired Vision: No Impaired Hearing: Yes Hearing Aid, HOH with aides Decreased Hand dexterity: No Knowledge/Comprehension Knowledge Level: Medium Comprehension Level: Medium Ability to understand written instructions: Medium Ability to understand verbal instructions: Medium Motivation Anxiety Level: Calm Cooperation: Cooperative Education Importance: Acknowledges Need Interest in Health Problems: Asks Questions Perception: Coherent Willingness to Engage in Self-Management Medium Activities: Readiness to Engage in Self-Management Medium Activities: Electronic Signature(s) Signed: 06/02/2020 3:56:48 PM By: Hansel Feinstein Entered ByHansel Feinstein on 06/02/2020 13:11:22 Amy Calhoun ([id number]) -------------------------------------------------------------------------------- Fall Risk Assessment Details Patient Name: Amy Calhoun. Date of Service: 06/02/2020 12:45 PM Medical Record Number: [id number] Patient Account Number: [id number] Date of Birth/Sex: 17-Aug-1928 (87 y.o.  F) Treating RN: Hansel Feinstein Primary Care Amy Calhoun: Amy Calhoun Other Clinician: Referring Amy Calhoun: Amy Calhoun Treating Amy Calhoun/Extender: Amy Calhoun in Treatment: 0 Fall Risk Assessment Items Have you had 2 or more falls in the last 12 monthso 0 Yes Have you had any fall that resulted in injury in the last 12 monthso 0 Yes FALLS RISK SCREEN History of falling - immediate or within 3 months 25 Yes Secondary diagnosis (Do you have 2 or more medical diagnoseso) 0 No Ambulatory aid None/bed rest/wheelchair/nurse 0 No Crutches/cane/walker 15 Yes Furniture 0 No Intravenous therapy Access/Saline/Heparin Lock 0 No Gait/Transferring Normal/ bed rest/ wheelchair 0 No Weak (short steps  with or without shuffle, stooped but able to lift head while walking, may 10 Yes seek support from furniture) Impaired (short steps with shuffle, may have difficulty arising from chair, head down, impaired 0 No balance) Mental Status Oriented to own ability 0 Yes Electronic Signature(s) Signed: 06/02/2020 3:56:48 PM By: Hansel Feinstein Entered By: Hansel Feinstein on 06/02/2020 13:14:22 Amy Calhoun ([id number]) -------------------------------------------------------------------------------- Foot Assessment Details Patient Name: Amy Calhoun. Date of Service: 06/02/2020 12:45 PM Medical Record Number: [id number] Patient Account Number: [id number] Date of Birth/Sex: Apr 07, 1928 (87 y.o. F) Treating RN: Hansel Feinstein Primary Care Amy Calhoun: Amy Calhoun Other Clinician: Referring Amy Calhoun: Amy Calhoun Treating Amy Calhoun/Extender: Amy Calhoun in Treatment: 0 Foot Assessment Items Site Locations + = Sensation present, - = Sensation absent, C = Callus, U = Ulcer R = Redness, W = Warmth, M = Maceration, PU = Pre-ulcerative lesion F = Fissure, S = Swelling, D = Dryness Assessment Right: Left: Other Deformity: No No Prior Foot Ulcer: No No Prior Amputation: No  No Charcot Joint: No No Ambulatory Status: Ambulatory With Help Gait: Unsteady Electronic Signature(s) Signed: 06/02/2020 3:56:48 PM By: Hansel Feinstein Entered ByHansel Feinstein on 06/02/2020 13:29:15 Follett, Dyanne Calhoun ([id number]) -------------------------------------------------------------------------------- Nutrition Risk Screening Details Patient Name: Amy Calhoun. Date of Service: 06/02/2020 12:45 PM Medical Record Number: [id number] Patient Account Number: [id number] Date of Birth/Sex: 12-14-1928 (87 y.o. F) Treating RN: Hansel Feinstein Primary Care Nadirah Socorro: Amy Calhoun Other Clinician: Referring Shonte Soderlund: Amy Calhoun Treating Anmol Paschen/Extender: Amy Calhoun in Treatment: 0 Height (in): 62 Weight (lbs): 136 Body Mass Index (BMI): 24.9 Nutrition Risk Screening Items Score Screening NUTRITION RISK SCREEN: I have an illness or condition that made me change the kind and/or amount of food I eat 0 No I eat fewer than two meals per day 0 No I eat few fruits and vegetables, or milk products 0 No I have three or more drinks of beer, liquor or wine almost every day 0 No I have tooth or mouth problems that make it hard for me to eat 0 No I don't always have enough money to buy the food I need 0 No I eat alone most of the time 0 No I take three or more different prescribed or over-the-counter drugs a day 1 Yes Without wanting to, I have lost or gained 10 pounds in the last six months 0 No I am not always physically able to shop, cook and/or feed myself 0 No Nutrition Protocols Good Risk Protocol 0 No interventions needed Moderate Risk Protocol High Risk Proctocol Risk Level: Good Risk Score: 1 Notes [place] [address] and meals provided each day Electronic Signature(s) Signed: 06/02/2020 3:56:48 PM By: Hansel Feinstein Entered ByHansel Feinstein on 06/02/2020 13:12:38

## 2020-06-02 NOTE — Progress Notes (Signed)
Amy Calhoun, MOW ([id number]) Visit Report for 06/02/2020 Chief Complaint Document Details Patient Name: Amy Calhoun. Date of Service: 06/02/2020 12:45 PM Medical Record Number: [id number] Patient Account Number: [id number] Date of Birth/Sex: 08/30/1928 (87 y.o. F) Treating RN: Dolan Amen Primary Care Provider: Mable Paris Other Clinician: Referring Provider: McLean-Scocuzza, Olivia Mackie Treating Provider/Extender: Skipper Cliche in Treatment: 0 Information Obtained from: Patient Chief Complaint Left heel ulcer Electronic Signature(s) Signed: 06/02/2020 2:21:02 PM By: Worthy Keeler PA-C Entered By: Worthy Keeler on 06/02/2020 14:21:02 Amy Calhoun ([id number]) -------------------------------------------------------------------------------- Debridement Details Patient Name: Amy Calhoun. Date of Service: 06/02/2020 12:45 PM Medical Record Number: [id number] Patient Account Number: [id number] Date of Birth/Sex: [birth year] (87 y.o. F) Treating RN: Dolan Amen Primary Care Provider: Mable Paris Other Clinician: Referring Provider: McLean-Scocuzza, Olivia Mackie Treating Provider/Extender: Skipper Cliche in Treatment: 0 Debridement Performed for Wound #1 Left Calcaneus Assessment: Performed By: Physician Tommie Sams., PA-C Debridement Type: Debridement Severity of Tissue Pre Debridement: Fat layer exposed Level of Consciousness (Pre- Awake and Alert procedure): Pre-procedure Verification/Time Out Yes - 14:25 Taken: Start Time: 14:25 Total Area Debrided (L x W): 1 (cm) x 1 (cm) = 1 (cm) Tissue and other material Non-Viable, Skin: Epidermis debrided: Level: Skin/Epidermis Debridement Description: Selective/Open Wound Instrument: Forceps, Scissors Bleeding: None Response to Treatment: Procedure was tolerated well Level of Consciousness (Post- Awake and Alert procedure): Post Debridement Measurements of Total Wound Length: (cm) 0.4 Width: (cm)  0.4 Depth: (cm) 0.1 Volume: (cm) 0.013 Character of Wound/Ulcer Post Debridement: Stable Severity of Tissue Post Debridement: Fat layer exposed Post Procedure Diagnosis Same as Pre-procedure Electronic Signature(s) Signed: 06/02/2020 5:03:18 PM By: Worthy Keeler PA-C Signed: 06/02/2020 5:24:26 PM By: Georges Mouse, Minus Breeding RN Entered By: Georges Mouse, Minus Breeding on 06/02/2020 14:27:17 Snead, Bluford Kaufmann ([id number]) -------------------------------------------------------------------------------- HPI Details Patient Name: Amy Calhoun. Date of Service: 06/02/2020 12:45 PM Medical Record Number: [id number] Patient Account Number: [id number] Date of Birth/Sex: [birth year] (87 y.o. F) Treating RN: Dolan Amen Primary Care Provider: Mable Paris Other Clinician: Referring Provider: McLean-Scocuzza, Olivia Mackie Treating Provider/Extender: Skipper Cliche in Treatment: 0 History of Present Illness HPI Description: 06/02/2020 upon evaluation today patient appears to be doing somewhat poorly in regard to her heel ulcer upon evaluation today. Fortunately there does not appear to be any signs of active infection at this time which is great news. This is stated to have been an area that rubbed on her shoe causing a painful lesion and what appears to be somewhat of a blister. There is a lot of callused skin which is somewhat hanging off that we need to clear away today. Fortunately there is no evidence of active infection systemically at this point which is great news and locally also appears to be doing great. Right now nothing is really been put on the area as far as any dressings to cover they have just been leaving it mainly open to air. The patient does have a history of diabetes mellitus type 2. She also has a hemoglobin A1c of 8.6 which was in February 2022. Her ABI today was 0.67 obviously this is not optimal but nonetheless I think it may be consistent with her being able to heal considering  the small nature of the wound and the fact that is already seeing evidence of healing. Electronic Signature(s) Signed: 06/02/2020 2:56:00 PM By: Worthy Keeler PA-C Entered By: Worthy Keeler on 06/02/2020 14:56:00 Naron, Bluford Kaufmann ([id number]) -------------------------------------------------------------------------------- Physical Exam Details Patient Name: Amy Calhoun, Amy Calhoun. Date  of Service: 06/02/2020 12:45 PM Medical Record Number: [id number] Patient Account Number: [id number] Date of Birth/Sex: [birth year] (87 y.o. F) Treating RN: Dolan Amen Primary Care Provider: Mable Paris Other Clinician: Referring Provider: McLean-Scocuzza, Olivia Mackie Treating Provider/Extender: Skipper Cliche in Treatment: 0 Constitutional patient is hypertensive.. pulse regular and within target range for patient.Marland Kitchen respirations regular, non-labored and within target range for patient.Marland Kitchen temperature within target range for patient.. Well-nourished and well-hydrated in no acute distress. Eyes conjunctiva clear no eyelid edema noted. pupils equal round and reactive to light and accommodation. Ears, Nose, Mouth, and Throat no gross abnormality of ear auricles or external auditory canals. patient has hearing loss. mucus membranes moist. Respiratory normal breathing without difficulty. Cardiovascular 1+ dorsalis pedis/posterior tibialis pulses. no clubbing, cyanosis, significant edema, <3 sec cap refill. Musculoskeletal Patient unable to walk without assistance. Psychiatric this patient is able to make decisions and demonstrates good insight into disease process. Patient is oriented to person only. pleasant and cooperative. Notes Upon inspection patient's wound bed actually showed signs of good granulation epithelization at this point. There does not appear to be any evidence of active infection which is great news and overall I am extremely pleased with where things stand today. I did have to perform  some debridement to remove some of the callus/blistered skin that was hanging over the surface of the wound. At this was removed the actual wound is fairly tiny and there really was no significant slough buildup that could not be cleared away just with saline and gauze which I did clear away today. The patient is not having any pain which is great news. Electronic Signature(s) Signed: 06/02/2020 2:58:17 PM By: Worthy Keeler PA-C Entered By: Worthy Keeler on 06/02/2020 14:58:17 Nale, Bluford Kaufmann ([id number]) -------------------------------------------------------------------------------- Physician Orders Details Patient Name: Amy Calhoun. Date of Service: 06/02/2020 12:45 PM Medical Record Number: [id number] Patient Account Number: [id number] Date of Birth/Sex: 01-16-1928 (87 y.o. F) Treating RN: Dolan Amen Primary Care Provider: Mable Paris Other Clinician: Referring Provider: McLean-Scocuzza, Olivia Mackie Treating Provider/Extender: Skipper Cliche in Treatment: 0 Verbal / Phone Orders: No Diagnosis Coding ICD-10 Coding Code Description E11.621 Type 2 diabetes mellitus with foot ulcer L97.422 Non-pressure chronic ulcer of left heel and midfoot with fat layer exposed Follow-up Appointments o Return Appointment in 2 weeks. Bathing/ Shower/ Hygiene o May shower; gently cleanse wound with antibacterial soap, rinse and pat dry prior to dressing wounds Wound Treatment Wound #1 - Calcaneus Wound Laterality: Left Cleanser: Byram Ancillary Kit - 15 Day Supply (DME) (Generic) 3 x Per Week/15 Days Discharge Instructions: Use supplies as instructed; Kit contains: (15) Saline Bullets; (15) 3x3 Gauze; 15 pr Gloves Cleanser: Soap and Water 3 x Per Week/15 Days Discharge Instructions: Gently cleanse wound with antibacterial soap, rinse and pat dry prior to dressing wounds Primary Dressing: Prisma 4.34 (in) 3 x Per Week/15 Days Discharge Instructions: Moisten w/normal saline or sterile  water; Cover wound as directed. Do not remove from wound bed. (extra sent with patient) Secondary Dressing: Mepilex Border Flex, 4x4 (in/in) (DME) (Generic) 3 x Per Week/15 Days Discharge Instructions: Apply to wound as directed. Do not cut. Electronic Signature(s) Signed: 06/02/2020 5:03:18 PM By: Worthy Keeler PA-C Signed: 06/02/2020 5:24:26 PM By: Georges Mouse, Minus Breeding RN Entered By: Georges Mouse, Minus Breeding on 06/02/2020 14:30:44 Medearis, Bluford Kaufmann ([id number]) -------------------------------------------------------------------------------- Problem List Details Patient Name: Amy Calhoun, Amy Calhoun. Date of Service: 06/02/2020 12:45 PM Medical Record Number: [id number] Patient Account Number: [id number] Date of Birth/Sex: 1928/06/30 (87 y.o. F) Treating  RN: Dolan Amen Primary Care Provider: Mable Paris Other Clinician: Referring Provider: McLean-Scocuzza, Olivia Mackie Treating Provider/Extender: Skipper Cliche in Treatment: 0 Active Problems ICD-10 Encounter Code Description Active Date MDM Diagnosis E11.621 Type 2 diabetes mellitus with foot ulcer 06/02/2020 No Yes L97.422 Non-pressure chronic ulcer of left heel and midfoot with fat layer 06/02/2020 No Yes exposed Inactive Problems Resolved Problems Electronic Signature(s) Signed: 06/02/2020 2:20:47 PM By: Worthy Keeler PA-C Entered By: Worthy Keeler on 06/02/2020 14:20:47 Watkinson, Bluford Kaufmann ([id number]) -------------------------------------------------------------------------------- Progress Note Details Patient Name: Amy Calhoun. Date of Service: 06/02/2020 12:45 PM Medical Record Number: [id number] Patient Account Number: [id number] Date of Birth/Sex: Nov 01, 1928 (87 y.o. F) Treating RN: Dolan Amen Primary Care Provider: Mable Paris Other Clinician: Referring Provider: McLean-Scocuzza, Olivia Mackie Treating Provider/Extender: Skipper Cliche in Treatment: 0 Subjective Chief Complaint Information obtained from  Patient Left heel ulcer History of Present Illness (HPI) 06/02/2020 upon evaluation today patient appears to be doing somewhat poorly in regard to her heel ulcer upon evaluation today. Fortunately there does not appear to be any signs of active infection at this time which is great news. This is stated to have been an area that rubbed on her shoe causing a painful lesion and what appears to be somewhat of a blister. There is a lot of callused skin which is somewhat hanging off that we need to clear away today. Fortunately there is no evidence of active infection systemically at this point which is great news and locally also appears to be doing great. Right now nothing is really been put on the area as far as any dressings to cover they have just been leaving it mainly open to air. The patient does have a history of diabetes mellitus type 2. She also has a hemoglobin A1c of 8.6 which was in February 2022. Her ABI today was 0.67 obviously this is not optimal but nonetheless I think it may be consistent with her being able to heal considering the small nature of the wound and the fact that is already seeing evidence of healing. Patient History Information obtained from Patient. Allergies pravastatin (Severity: Mild, Reaction: gi) Social History Never smoker, Marital Status - Widowed, Alcohol Use - Never, Drug Use - No History, Caffeine Use - Daily. Medical History Eyes Patient has history of Cataracts - hx surgery Hematologic/Lymphatic Patient has history of Anemia Endocrine Patient has history of Type II Diabetes - 1975 Musculoskeletal Patient has history of Osteoarthritis Patient is treated with Insulin, Oral Agents. Blood sugar is tested. Review of Systems (ROS) Constitutional Symptoms (General Health) Denies complaints or symptoms of Fatigue, Fever, Chills, Marked Weight Change. Eyes Complains or has symptoms of Glasses / Contacts. Ear/Nose/Mouth/Throat stricture-has stretching done  routinely Respiratory Denies complaints or symptoms of Chronic or frequent coughs, Shortness of Breath. Cardiovascular Denies complaints or symptoms of Chest pain, LE edema. Endocrine Complains or has symptoms of Thyroid disease - hx thyroidectomy, A1C 8.6 on 03/09/20 Genitourinary Complains or has symptoms of Incontinence/dribbling, cystitis Immunological Denies complaints or symptoms of Hives, Itching. Integumentary (Skin) Denies complaints or symptoms of Wounds, Bleeding or bruising tendency, Breakdown, Swelling. Neurologic Denies complaints or symptoms of Numbness/parasthesias, Focal/Weakness. Psychiatric Denies complaints or symptoms of Anxiety, Claustrophobia. TAHRA, HITZEMAN. ([id number]) Objective Constitutional patient is hypertensive.. pulse regular and within target range for patient.Marland Kitchen respirations regular, non-labored and within target range for patient.Marland Kitchen temperature within target range for patient.. Well-nourished and well-hydrated in no acute distress. Vitals Time Taken: 1:05 PM, Height: 62 in, Source: Stated, Weight:  136 lbs, Source: Measured, BMI: 24.9, Temperature: 98 F, Pulse: 68 bpm, Respiratory Rate: 18 breaths/min, Blood Pressure: 167/59 mmHg. Eyes conjunctiva clear no eyelid edema noted. pupils equal round and reactive to light and accommodation. Ears, Nose, Mouth, and Throat no gross abnormality of ear auricles or external auditory canals. patient has hearing loss. mucus membranes moist. Respiratory normal breathing without difficulty. Cardiovascular 1+ dorsalis pedis/posterior tibialis pulses. no clubbing, cyanosis, significant edema, Musculoskeletal Patient unable to walk without assistance. Psychiatric this patient is able to make decisions and demonstrates good insight into disease process. Patient is oriented to person only. pleasant and cooperative. General Notes: Upon inspection patient's wound bed actually showed signs of good granulation  epithelization at this point. There does not appear to be any evidence of active infection which is great news and overall I am extremely pleased with where things stand today. I did have to perform some debridement to remove some of the callus/blistered skin that was hanging over the surface of the wound. At this was removed the actual wound is fairly tiny and there really was no significant slough buildup that could not be cleared away just with saline and gauze which I did clear away today. The patient is not having any pain which is great news. Integumentary (Hair, Skin) Wound #1 status is Open. Original cause of wound was Gradually Appeared. The date acquired was: 05/09/2020. The wound is located on the Left Calcaneus. The wound measures 0.4cm length x 0.4cm width x 0.1cm depth; 0.126cm^2 area and 0.013cm^3 volume. There is Fat Layer (Subcutaneous Tissue) exposed. There is no tunneling or undermining noted. There is a medium amount of serous drainage noted. There is medium (34-66%) granulation within the wound bed. There is a medium (34-66%) amount of necrotic tissue within the wound bed including Adherent Slough. Assessment Active Problems ICD-10 Type 2 diabetes mellitus with foot ulcer Non-pressure chronic ulcer of left heel and midfoot with fat layer exposed Procedures Wound #1 Pre-procedure diagnosis of Wound #1 is a Diabetic Wound/Ulcer of the Lower Extremity located on the Left Calcaneus .Severity of Tissue Pre Debridement is: Fat layer exposed. There was a Selective/Open Wound Skin/Epidermis Debridement with a total area of 1 sq cm performed by Tommie Sams., PA-C. With the following instrument(s): Forceps, and Scissors to remove Non-Viable tissue/material. Material removed includes Skin: Epidermis. A time out was conducted at 14:25, prior to the start of the procedure. There was no bleeding. The procedure was tolerated well. Post Debridement Measurements: 0.4cm length x 0.4cm width  x 0.1cm depth; 0.013cm^3 volume. Character of Wound/Ulcer Post Debridement is stable. Severity of Tissue Post Debridement is: Fat layer exposed. Post procedure Diagnosis Wound #1: Same as Pre-Procedure ARELI, FRARY. ([id number]) Plan Follow-up Appointments: Return Appointment in 2 weeks. Bathing/ Shower/ Hygiene: May shower; gently cleanse wound with antibacterial soap, rinse and pat dry prior to dressing wounds WOUND #1: - Calcaneus Wound Laterality: Left Cleanser: Byram Ancillary Kit - 15 Day Supply (DME) (Generic) 3 x Per Week/15 Days Discharge Instructions: Use supplies as instructed; Kit contains: (15) Saline Bullets; (15) 3x3 Gauze; 15 pr Gloves Cleanser: Soap and Water 3 x Per Week/15 Days Discharge Instructions: Gently cleanse wound with antibacterial soap, rinse and pat dry prior to dressing wounds Primary Dressing: Prisma 4.34 (in) 3 x Per Week/15 Days Discharge Instructions: Moisten w/normal saline or sterile water; Cover wound as directed. Do not remove from wound bed. (extra sent with patient) Secondary Dressing: Mepilex Border Flex, 4x4 (in/in) (DME) (Generic) 3 x Per  Week/15 Days Discharge Instructions: Apply to wound as directed. Do not cut. 1. Would recommend currently that we going to continue with the recommendations that I made over the next 2 weeks for the patient. This is good to be that we go ahead and utilize silver collagen which I think would be a good option for her at this point. 2. I am also can recommend that we have the patient use a border foam dressing which will both protect as well as keep the collagen in place. I think this will give her some additional benefit both from a pain perspective as well as from a pressure perspective. 3. I am also can recommend the patient should try to use bedroom slippers or something like that to make sure nothing is rubbing on the back of her heel while this heals I am hoping that it will heal over the next couple of  weeks. 4. Her ABI was significantly low but nonetheless I think she may have enough blood flow based on what I am seeing physically to allow this area to heal. We will see how things go. She may require referral for formal arterial studies if things do not seem to be improving appropriately but I am hopeful she will heal without complication. We will see patient back for reevaluation in 1 week here in the clinic. If anything worsens or changes patient will contact our office for additional recommendations. Electronic Signature(s) Signed: 06/02/2020 2:59:27 PM By: Worthy Keeler PA-C Entered By: Worthy Keeler on 06/02/2020 14:59:27 Myhand, Bluford Kaufmann ([id number]) -------------------------------------------------------------------------------- ROS/PFSH Details Patient Name: Amy Calhoun. Date of Service: 06/02/2020 12:45 PM Medical Record Number: [id number] Patient Account Number: [id number] Date of Birth/Sex: 11/05/1928 (87 y.o. F) Treating RN: Donnamarie Poag Primary Care Provider: Mable Paris Other Clinician: Referring Provider: McLean-Scocuzza, Olivia Mackie Treating Provider/Extender: Skipper Cliche in Treatment: 0 Information Obtained From Patient Constitutional Symptoms (General Health) Complaints and Symptoms: Negative for: Fatigue; Fever; Chills; Marked Weight Change Eyes Complaints and Symptoms: Positive for: Glasses / Contacts Medical History: Positive for: Cataracts - hx surgery Respiratory Complaints and Symptoms: Negative for: Chronic or frequent coughs; Shortness of Breath Cardiovascular Complaints and Symptoms: Negative for: Chest pain; LE edema Endocrine Complaints and Symptoms: Positive for: Thyroid disease - hx thyroidectomy Review of System Notes: A1C 8.6 on 03/09/20 Medical History: Positive for: Type II Diabetes - 1975 Time with diabetes: 1975 Treated with: Insulin, Oral agents Blood sugar tested every day: Yes Tested : has in arm to  monitor Genitourinary Complaints and Symptoms: Positive for: Incontinence/dribbling Review of System Notes: cystitis Immunological Complaints and Symptoms: Negative for: Hives; Itching Integumentary (Skin) Complaints and Symptoms: Negative for: Wounds; Bleeding or bruising tendency; Breakdown; Swelling Neurologic Jenison, Kinsey M. ([id number]) Complaints and Symptoms: Negative for: Numbness/parasthesias; Focal/Weakness Psychiatric Complaints and Symptoms: Negative for: Anxiety; Claustrophobia Ear/Nose/Mouth/Throat Complaints and Symptoms: Review of System Notes: stricture-has stretching done routinely Hematologic/Lymphatic Medical History: Positive for: Anemia Musculoskeletal Medical History: Positive for: Osteoarthritis Oncologic HBO Extended History Items Eyes: Cataracts Immunizations Pneumococcal Vaccine: Received Pneumococcal Vaccination: Yes Implantable Devices None Family and Social History Never smoker; Marital Status - Widowed; Alcohol Use: Never; Drug Use: No History; Caffeine Use: Daily; Financial Concerns: No; Food, Clothing or Shelter Needs: No; Support System Lacking: No; Transportation Concerns: No Electronic Signature(s) Signed: 06/02/2020 3:56:48 PM By: Donnamarie Poag Signed: 06/02/2020 5:03:18 PM By: Worthy Keeler PA-C Entered By: Donnamarie Poag on 06/02/2020 13:22:00 Amy Calhoun ([id number]) -------------------------------------------------------------------------------- [place] Details Patient Name: Amy Calhoun. Date of  Service: 06/02/2020 Medical Record Number: [id number] Patient Account Number: [id number] Date of Birth/Sex: 1928/02/06 (87 y.o. F) Treating RN: Dolan Amen Primary Care Provider: Mable Paris Other Clinician: Referring Provider: McLean-Scocuzza, Olivia Mackie Treating Provider/Extender: Skipper Cliche in Treatment: 0 Diagnosis Coding ICD-10 Codes Code Description E11.621 Type 2 diabetes mellitus with foot ulcer L97.422  Non-pressure chronic ulcer of left heel and midfoot with fat layer exposed Facility Procedures CPT4 Code: [id number] Description: [phone] - WOUND CARE VISIT-LEV 2 EST PT Modifier: Quantity: 1 CPT4 Code: [id number] Description: 24401 - DEBRIDE WOUND 1ST 20 SQ CM OR < Modifier: Quantity: 1 CPT4 Code: Description: ICD-10 Diagnosis Description L97.422 Non-pressure chronic ulcer of left heel and midfoot with fat layer expos Modifier: ed Quantity: Physician Procedures CPT4 Code: [id number] Description: WC PHYS LEVEL 3 o NEW PT Modifier: 25 Quantity: 1 CPT4 Code: Description: ICD-10 Diagnosis Description E11.621 Type 2 diabetes mellitus with foot ulcer L97.422 Non-pressure chronic ulcer of left heel and midfoot with fat layer expos Modifier: ed Quantity: CPT4 Code: [id number] Description: 97597 - WC PHYS DEBR WO ANESTH 20 SQ CM Modifier: Quantity: 1 CPT4 Code: Description: ICD-10 Diagnosis Description L97.422 Non-pressure chronic ulcer of left heel and midfoot with fat layer expos Modifier: ed Quantity: Electronic Signature(s) Signed: 06/02/2020 2:59:41 PM By: Worthy Keeler PA-C Entered By: Worthy Keeler on 06/02/2020 14:59:41

## 2020-06-06 DIAGNOSIS — R42 Dizziness and giddiness: Secondary | ICD-10-CM | POA: Insufficient documentation

## 2020-06-06 DIAGNOSIS — R296 Repeated falls: Secondary | ICD-10-CM | POA: Insufficient documentation

## 2020-06-06 DIAGNOSIS — R269 Unspecified abnormalities of gait and mobility: Secondary | ICD-10-CM | POA: Insufficient documentation

## 2020-06-06 DIAGNOSIS — R2689 Other abnormalities of gait and mobility: Secondary | ICD-10-CM | POA: Insufficient documentation

## 2020-06-08 ENCOUNTER — Telehealth: Payer: Self-pay | Admitting: Family

## 2020-06-08 NOTE — Telephone Encounter (Signed)
FYI mclean  Amy Calhoun,  What is status of CT head being scheduled?

## 2020-06-08 NOTE — Telephone Encounter (Signed)
Would not cover CT head ordered Mri instead

## 2020-06-08 NOTE — Telephone Encounter (Signed)
noted 

## 2020-06-08 NOTE — Telephone Encounter (Signed)
Was CT head approved ?   I received denial letter today

## 2020-06-08 NOTE — Addendum Note (Signed)
Addended by: Quentin Ore on: 06/08/2020 12:48 PM   Modules accepted: Orders

## 2020-06-16 ENCOUNTER — Ambulatory Visit: Payer: Medicare HMO | Admitting: Physician Assistant

## 2020-06-16 ENCOUNTER — Telehealth: Payer: Self-pay | Admitting: Family

## 2020-06-16 NOTE — Telephone Encounter (Signed)
lft vm for pt daughter to call ofc to sch MRI. thanks

## 2020-06-17 ENCOUNTER — Ambulatory Visit: Payer: Medicare HMO | Admitting: Physician Assistant

## 2020-06-27 ENCOUNTER — Telehealth: Payer: Self-pay | Admitting: Family

## 2020-06-27 NOTE — Telephone Encounter (Signed)
PT daughter called in regards to the MRI that is schedule for Wednesday. They received a letter stating that insurance will not cover the MRI and would like to find out why and what can be done in regards to it.

## 2020-06-28 NOTE — Telephone Encounter (Signed)
Do you have any idea about this?  

## 2020-06-29 ENCOUNTER — Ambulatory Visit: Payer: Medicare HMO

## 2020-06-29 ENCOUNTER — Encounter: Payer: Self-pay | Admitting: Family

## 2020-06-29 ENCOUNTER — Ambulatory Visit (INDEPENDENT_AMBULATORY_CARE_PROVIDER_SITE_OTHER): Payer: Medicare HMO | Admitting: Family

## 2020-06-29 ENCOUNTER — Other Ambulatory Visit: Payer: Self-pay

## 2020-06-29 VITALS — BP 140/58 | HR 74 | Temp 98.1°F | Ht 62.0 in | Wt 137.4 lb

## 2020-06-29 DIAGNOSIS — E538 Deficiency of other specified B group vitamins: Secondary | ICD-10-CM | POA: Diagnosis not present

## 2020-06-29 DIAGNOSIS — E785 Hyperlipidemia, unspecified: Secondary | ICD-10-CM

## 2020-06-29 DIAGNOSIS — E1165 Type 2 diabetes mellitus with hyperglycemia: Secondary | ICD-10-CM

## 2020-06-29 DIAGNOSIS — R42 Dizziness and giddiness: Secondary | ICD-10-CM | POA: Diagnosis not present

## 2020-06-29 DIAGNOSIS — IMO0002 Reserved for concepts with insufficient information to code with codable children: Secondary | ICD-10-CM

## 2020-06-29 DIAGNOSIS — Z23 Encounter for immunization: Secondary | ICD-10-CM

## 2020-06-29 DIAGNOSIS — G8929 Other chronic pain: Secondary | ICD-10-CM

## 2020-06-29 DIAGNOSIS — L539 Erythematous condition, unspecified: Secondary | ICD-10-CM

## 2020-06-29 DIAGNOSIS — E1129 Type 2 diabetes mellitus with other diabetic kidney complication: Secondary | ICD-10-CM

## 2020-06-29 MED ORDER — EZETIMIBE 10 MG PO TABS: 10.0000 mg | ORAL_TABLET | Freq: Every day | ORAL | 3 refills | Status: DC

## 2020-06-29 NOTE — Assessment & Plan Note (Signed)
Reassuring exam. No evidence of infection nor tenderness. Patient declines labs for inflammation, uric acid, or xray of toe/foot at this time. She will let me know of any concerns.

## 2020-06-29 NOTE — Assessment & Plan Note (Signed)
Uncontrolled. Follows with endocrine. Discussed if patient would benefit from nurses to come to home / home health. Daughter declines at this time.

## 2020-06-29 NOTE — Assessment & Plan Note (Signed)
Stable, chronic. Continue b12.

## 2020-06-29 NOTE — Assessment & Plan Note (Signed)
Controlled on zocor 40mg . Daughter's concern for memory changes and would like to trial stop zocor and star zetia. Discussed patient's elevated risk of ASCVD in setting of age, HTN, DM and specifically concern for CVA. Daughter aware however would like to briefly see if cognition improves.

## 2020-06-29 NOTE — Assessment & Plan Note (Signed)
Chronic, stable. Continue tylenol #3.

## 2020-06-29 NOTE — Assessment & Plan Note (Addendum)
Lower extremity weakness improving with PT. No recurrent fall since PT. Reassuring neurologic exam. Discussed differentials of dizziness including dehydration, tia, medication side effect, inner ear ( ? Related to hearing loss, tinnitus).  She declines MRI brain and I advised to also have MRA head with bilateral US carotids. She declines. Discussed dizziness as of a symptom of CVA and counseled her on symptoms, signs of CVA. She will let me know how she is doing.

## 2020-06-29 NOTE — Patient Instructions (Signed)
Stop zocor  Start zetia  Let me know if dizziness worsens in any way and remember signs of stroke as we discussed.     Stroke Prevention Some medical conditions and behaviors are associated with a higher chance of having a stroke. You can help prevent a stroke by making nutrition, lifestyle,and other changes, including managing any medical conditions you may have. What nutrition changes can be made?  Eat healthy foods. You can do this by: Choosing foods high in fiber, such as fresh fruits and vegetables and whole grains. Eating at least 5 or more servings of fruits and vegetables a day. Try to fill half of your plate at each meal with fruits and vegetables. Choosing lean protein foods, such as lean cuts of meat, poultry without skin, fish, tofu, beans, and nuts. Eating low-fat dairy products. Avoiding foods that are high in salt (sodium). This can help lower blood pressure. Avoiding foods that have saturated fat, trans fat, and cholesterol. This can help prevent high cholesterol. Avoiding processed and premade foods. Follow your health care provider's specific guidelines for losing weight, controlling high blood pressure (hypertension), lowering high cholesterol, and managing diabetes. These may include: Reducing your daily calorie intake. Limiting your daily sodium intake to 1,500 milligrams (mg). Using only healthy fats for cooking, such as olive oil, canola oil, or sunflower oil. Counting your daily carbohydrate intake. What lifestyle changes can be made? Maintain a healthy weight. Talk to your health care provider about your ideal weight. Get at least 30 minutes of moderate physical activity at least 5 days a week. Moderate activity includes brisk walking, biking, and swimming. Do not use any products that contain nicotine or tobacco, such as cigarettes and e-cigarettes. If you need help quitting, ask your health care provider. It may also be helpful to avoid exposure to secondhand  smoke. Limit alcohol intake to no more than 1 drink a day for nonpregnant women and 2 drinks a day for men. One drink equals 12 oz of beer, 5 oz of wine, or 1 oz of hard liquor. Stop any illegal drug use. Avoid taking birth control pills. Talk to your health care provider about the risks of taking birth control pills if: You are over 70 years old. You smoke. You get migraines. You have ever had a blood clot. What other changes can be made? Manage your cholesterol levels. Eating a healthy diet is important for preventing high cholesterol. If cholesterol cannot be managed through diet alone, you may also need to take medicines. Take any prescribed medicines to control your cholesterol as told by your health care provider. Manage your diabetes. Eating a healthy diet and exercising regularly are important parts of managing your blood sugar. If your blood sugar cannot be managed through diet and exercise, you may need to take medicines. Take any prescribed medicines to control your diabetes as told by your health care provider. Control your hypertension. To reduce your risk of stroke, try to keep your blood pressure below 130/80. Eating a healthy diet and exercising regularly are an important part of controlling your blood pressure. If your blood pressure cannot be managed through diet and exercise, you may need to take medicines. Take any prescribed medicines to control hypertension as told by your health care provider. Ask your health care provider if you should monitor your blood pressure at home. Have your blood pressure checked every year, even if your blood pressure is normal. Blood pressure increases with age and some medical conditions. Get evaluated for  sleep disorders (sleep apnea). Talk to your health care provider about getting a sleep evaluation if you snore a lot or have excessive sleepiness. Take over-the-counter and prescription medicines only as told by your health care provider.  Aspirin or blood thinners (antiplatelets or anticoagulants) may be recommended to reduce your risk of forming blood clots that can lead to stroke. Make sure that any other medical conditions you have, such as atrial fibrillation or atherosclerosis, are managed. What are the warning signs of a stroke? The warning signs of a stroke can be easily remembered as BEFAST. B is for balance. Signs include: Dizziness. Loss of balance or coordination. Sudden trouble walking. E is for eyes. Signs include: A sudden change in vision. Trouble seeing. F is for face. Signs include: Sudden weakness or numbness of the face. The face or eyelid drooping to one side. A is for arms. Signs include: Sudden weakness or numbness of the arm, usually on one side of the body. S is for speech. Signs include: Trouble speaking (aphasia). Trouble understanding. T is for time. These symptoms may represent a serious problem that is an emergency. Do not wait to see if the symptoms will go away. Get medical help right away. Call your local emergency services (911 in the [place] Do not drive yourself to the hospital. Other signs of stroke may include: A sudden, severe headache with no known cause. Nausea or vomiting. Seizure. Where to find more information For more information, visit: American Stroke Association: www.strokeassociation.org National Stroke Association: www.stroke.org Summary You can prevent a stroke by eating healthy, exercising, not smoking, limiting alcohol intake, and managing any medical conditions you may have. Do not use any products that contain nicotine or tobacco, such as cigarettes and e-cigarettes. If you need help quitting, ask your health care provider. It may also be helpful to avoid exposure to secondhand smoke. Remember BEFAST for warning signs of stroke. Get help right away if you or a loved one has any of these signs. This information is not intended to replace advice given to you by your  health care provider. Make sure you discuss any questions you have with your healthcare provider. Document Revised: 12/14/2016 Document Reviewed: 02/07/2016 Elsevier Patient Education  2021 [employer].

## 2020-06-29 NOTE — Progress Notes (Signed)
Subjective:    Patient ID: [name], female    DOB: [birth year], 87 y.o.   MRN: [id number]  CC: Maison Jaima Janney is a 87 y.o. female who presents today for follow up.   HPI: Accompanied by daughter, [name]  Complains left great toe redness 6 days, improving. Skin intact.  Hasnt tried any medication for this. Had pedicure 2 weeks ago.  Has bilateral numbness ventral aspect of feet. No fever, chills.   [name] canceled MRI brain as insurance didn't cover. Per phone call with rasheedah 2 days ago, [name] is waiting on letter from insurance to ensure it is covered.   Complains of dizziness when getting out bed, occurs most days. Only occurs in the morning and not in the afternoons.  Dizziness occurs in the morning prior to taking tylenol #3.   No syncope, CP,  vertigo, ear pain,  SOB, HA, vision changes, facial numbness, confusion, mental status changes.   Hears 'motor running' in ears.  She has started PT 3 times per week which has been helpful for weakness in legs. Per [name], she didn't qualify for OT. Using cane or walker. No falls since PT has started.    HTN- compliant with lisinopril 2.5mg  B12 deficiency- compliant with monthly b12 injectios and continues to find helpful.   Chronic pain- low back pain is at baseline.  Compliant with tylenol #3   HLD- compliant with zocor 40mg    DM- follows with Follows with Dr . Compliant with CGM. Last a1c 8.6.    Seen by Dr Lonzo Cloud last month for abnormal  gait, dizziness.  MRI brain was ordered, not scheduled PT [place] Left heel wound. She was referred to wound care whom she saw and left heel ulcer has healed   ?home health    Due PPSV23   HISTORY:  Past Medical History:  Diagnosis Date   Allergy    Ambulates with cane    Anemia    At high risk for falls    Cataract    removed both eyes   Chronic kidney disease 08/07/2016   Chronic renal failure, stage III   Depression    Esophageal dysphagia    Gastric  ulcer    GERD (gastroesophageal reflux disease)    Hyperlipidemia    Hypertension    Hypothyroidism    IDDM (insulin dependent diabetes mellitus)    Ischemic colitis (HCC)    Osteoarthritis    Ulcer of esophagus    Past Surgical History:  Procedure Laterality Date   ABDOMINAL HYSTERECTOMY     CATARACT EXTRACTION, BILATERAL     CHOLECYSTECTOMY     COLONOSCOPY  2010   ESOPHAGEAL DILATION  08/07/2016   usually a couple times a year, last time 07/30/16   GALLBLADDER SURGERY     LUMBAR LAMINECTOMY/DECOMPRESSION MICRODISCECTOMY N/A 08/10/2016   Procedure: BILATERAL HEMILAMINECTOMY LUMBAR THREE-FOUR,LUMBAR FOUR-FIVE,LEFT LUMBAR FIVE-SACRAL ONE HEMILAMINECTOMY AND DECOMPRESSION;  Surgeon: 08/12/2016, MD;  Location: [facility] OR;  Service: Neurosurgery;  Laterality: N/A;   THYROIDECTOMY     UPPER GASTROINTESTINAL ENDOSCOPY     Family History  Problem Relation Age of Onset   Diabetes Mother    Arthritis Mother    Hyperlipidemia Mother    Mental illness Mother    Heart disease Father    Mental illness Sister    Arthritis Maternal Grandmother    Arthritis Maternal Grandfather    Colon cancer Neg Hx    Stomach cancer Neg Hx    Esophageal cancer Neg  Hx    Rectal cancer Neg Hx    Colon polyps Neg Hx     Allergies: Pravachol [pravastatin] Current Outpatient Medications on File Prior to Visit  Medication Sig Dispense Refill   Acetaminophen-Codeine 300-30 MG tablet TAKE 1 TABLET BY MOUTH EVERY 12 (TWELVE) HOURS AS NEEDED FOR PAIN (MODERATE TO SEVERE PAIN). 60 tablet 1   albuterol (PROVENTIL HFA;VENTOLIN HFA) 108 (90 Base) MCG/ACT inhaler Inhale 2 puffs into the lungs every 6 (six) hours as needed for wheezing or shortness of breath. 1 Inhaler 2   amitriptyline (ELAVIL) 75 MG tablet TAKE 2 TABLETS AT BEDTIME 180 tablet 3   ascorbic acid (VITAMIN C) 500 MG tablet Take 500 mg by mouth daily.     Cholecalciferol (VITAMIN D) 2000 units tablet Take 2,000 Units by mouth daily.      Continuous Blood  Gluc Sensor (FREESTYLE LIBRE 14 DAY SENSOR) MISC 1 Package by Does not apply route every 14 (fourteen) days. 6 each 3   CRANBERRY PO Take 1 tablet by mouth every Monday, Wednesday, and Friday.     cyanocobalamin (,VITAMIN B-12,) 1000 MCG/ML injection INJECT 1ML WEEKLY FOR 4 WEEKS THEN INJECT 1ML MONTHLY (Patient taking differently: 1ML MONTHLY) 3 mL 5   docusate sodium (COLACE) 100 MG capsule Take 1 capsule (100 mg total) by mouth 2 (two) times daily. (Patient taking differently: Take 100 mg by mouth every other day.) 10 capsule 0   gabapentin (NEURONTIN) 100 MG capsule Take 2 capsules (200 mg total) by mouth 2 (two) times daily. 540 capsule 3   glucagon (GLUCAGON EMERGENCY) 1 MG injection Inject 1 mg into the vein once as needed. For hypoglycemic episodes. E11.42 1 each 3   Insulin Glargine (LANTUS SOLOSTAR) 100 UNIT/ML Solostar Pen Inject 8 Units into the skin at bedtime. 15 mL 11   insulin lispro (HUMALOG KWIKPEN) 100 UNIT/ML KwikPen Inject 6 Units into the skin 3 (three) times daily. 15 mL 11   Insulin Pen Needle 32G X 4 MM MISC 1 Device by Does not apply route in the morning, at noon, in the evening, and at bedtime. 150 each 11   Iron, Ferrous Sulfate, 325 (65 Fe) MG TABS Take 325 mg by mouth every other day. 90 tablet 2   levothyroxine (SYNTHROID) 150 MCG tablet TAKE 1 TABLET DAILY 90 tablet 3   lisinopril (ZESTRIL) 2.5 MG tablet Take 1 tablet (2.5 mg total) by mouth daily. Take for blood pressure greater than 140/90 90 tablet 1   Misc. Devices (BARIATRIC ROLLATOR) MISC Use as needed 1 each 0   omeprazole (PRILOSEC) 40 MG capsule TAKE 1 CAPSULE TWICE A DAY 180 capsule 3   ondansetron (ZOFRAN ODT) 4 MG disintegrating tablet Take 1 tablet (4 mg total) by mouth every 8 (eight) hours as needed for nausea or vomiting. 30 tablet 0   pyridOXINE (VITAMIN B-6) 100 MG tablet Take 100 mg by mouth daily.     riboflavin (VITAMIN B-2) 100 MG TABS tablet Take 100 mg by mouth daily.     Turmeric 500 MG TABS  Take 750 mg by mouth 2 (two) times daily.     No current facility-administered medications on file prior to visit.    Social History   Tobacco Use   Smoking status: Never   Smokeless tobacco: Never  Vaping Use   Vaping Use: Never used  Substance Use Topics   Alcohol use: No   Drug use: No    Review of Systems  Constitutional:  Negative for  appetite change, chills and fever.  HENT:  Positive for hearing loss. Negative for ear pain.   Respiratory:  Negative for cough.   Cardiovascular:  Negative for chest pain and palpitations.  Gastrointestinal:  Negative for nausea and vomiting.  Musculoskeletal:  Positive for back pain.  Neurological:  Positive for dizziness and weakness (lower extremity). Negative for syncope, facial asymmetry, speech difficulty, numbness and headaches.     Objective:    BP (!) 140/58 (BP Location: Left Arm, Patient Position: Sitting, Cuff Size: Large)   Pulse 74   Temp 98.1 F (36.7 C) (Oral)   Ht 5\' 2"  (1.575 m)   Wt 137 lb 6.4 oz (62.3 kg)   SpO2 95%   BMI 25.13 kg/m  BP Readings from Last 3 Encounters:  06/29/20 (!) 140/58  03/18/20 (!) 140/56  03/16/20 (!) 142/82   Wt Readings from Last 3 Encounters:  06/29/20 137 lb 6.4 oz (62.3 kg)  05/26/20 136 lb (61.7 kg)  03/18/20 137 lb 9.6 oz (62.4 kg)    Physical Exam Vitals reviewed.  Constitutional:      Appearance: She is well-developed.  HENT:     Mouth/Throat:     Pharynx: Uvula midline.  Eyes:     Conjunctiva/sclera: Conjunctivae normal.     Pupils: Pupils are equal, round, and reactive to light.     Comments: Fundus normal bilaterally.   Cardiovascular:     Rate and Rhythm: Normal rate and regular rhythm.     Pulses: Normal pulses.     Heart sounds: Normal heart sounds.     Comments: No carotid bruit bilaterally. Pulmonary:     Effort: Pulmonary effort is normal.     Breath sounds: Normal breath sounds. No wheezing, rhonchi or rales.  Musculoskeletal:       Feet:  Skin:     General: Skin is warm and dry.  Neurological:     Mental Status: She is alert.     Cranial Nerves: No cranial nerve deficit.     Sensory: No sensory deficit.     Deep Tendon Reflexes:     Reflex Scores:      Bicep reflexes are 2+ on the right side and 2+ on the left side.      Patellar reflexes are 2+ on the right side and 2+ on the left side.    Comments: Grip equal and strong bilateral upper extremities. Able to perform finger to nose without difficulty.    Psychiatric:        Speech: Speech normal.        Behavior: Behavior normal.        Thought Content: Thought content normal.       Assessment & Plan:   Problem List Items Addressed This Visit       Endocrine   DM (diabetes mellitus), type 2, uncontrolled, with renal complications (HCC)    Uncontrolled. Follows with endocrine. Discussed if patient would benefit from nurses to come to home / home health. Daughter declines at this time.          Other   B12 deficiency    Stable, chronic. Continue b12.        Chronic pain    Chronic, stable. Continue tylenol #3.        Dizziness    Lower extremity weakness improving with PT. No recurrent fall since PT. Reassuring neurologic exam. Discussed differentials of dizziness including dehydration, tia, medication side effect, inner ear ( ? Related to hearing loss).  She declines MRI brain and I advised to also have MRA head with bilateral US carotids. She declines. Discussed dizziness as of a symptom of CVA and counseled her on symptoms, signs of CVA. She will let me know how she is doing.        Hyperlipidemia - Primary    Controlled on zocor 40mg . Daughter's concern for memory changes and would like to trial stop zocor and star zetia. Discussed patient's elevated risk of ASCVD in setting of age, HTN, DM and specifically concern for CVA. Daughter aware however would like to briefly see if cognition improves.        Relevant Medications   ezetimibe (ZETIA) 10 MG tablet    Other Relevant Orders   Lipid panel   Redness of skin    Reassuring exam. No evidence of infection nor tenderness. Patient declines labs for inflammation, uric acid, or xray of toe/foot at this time. She will let me know of any concerns.        Other Visit Diagnoses     Need for 23-polyvalent pneumococcal polysaccharide vaccine       Relevant Orders   Pneumococcal polysaccharide vaccine 23-valent greater than or equal to 2yo subcutaneous/IM (Completed)        I have discontinued Dicy M. Withrow's simvastatin. I am also having her start on ezetimibe. Additionally, I am having her maintain her pyridOXINE, riboflavin, glucagon, docusate sodium, CRANBERRY PO, albuterol, Vitamin D, Turmeric, ondansetron, Lantus SoloStar, omeprazole, cyanocobalamin, lisinopril, Bariatric Rollator, FreeStyle Libre 14 Day Sensor, ascorbic acid, Insulin Pen Needle, insulin lispro, levothyroxine, amitriptyline, gabapentin, Acetaminophen-Codeine, and Iron (Ferrous Sulfate).   Meds ordered this encounter  Medications   ezetimibe (ZETIA) 10 MG tablet    Sig: Take 1 tablet (10 mg total) by mouth daily.    Dispense:  90 tablet    Refill:  3    Order Specific Question:   Supervising Provider    Answer:   M [[id number]]     Return precautions given.   Risks, benefits, and alternatives of the medications and treatment plan prescribed today were discussed, and patient expressed understanding.   Education regarding symptom management and diagnosis given to patient on AVS.  Continue to follow with Sherlene Shams, FNP for routine health maintenance.   Dima Mae Allegra Grana and I agreed with plan.   [place], FNP  I have spent 45 minutes with a patient including precharting, exam, reviewing medical records, and discussion plan of care.

## 2020-07-07 ENCOUNTER — Telehealth: Payer: Self-pay | Admitting: Family

## 2020-07-07 NOTE — Telephone Encounter (Signed)
PT daughter called to advise that the big toe has become more swollen and red. They reached out to the Foot Dr and Lowella Grip be seen till next week 6/28 and the Dr Lowella Grip give any advise till then. They would like to see what Arnett would recommend to do such as soaking or ointment on the toe till the PT is seen next week.

## 2020-07-07 NOTE — Telephone Encounter (Signed)
I called and advised patient's daughter, Amy Calhoun that Amy Calhoun was out of office until Monday. No appointments available here in office. I asked that she please take patient to UC for emergent evaluation. Amy Calhoun stated that she was going to soak her mom's foot in epsom salt & if no relief that she would take her to be seen at an UC.

## 2020-07-12 ENCOUNTER — Ambulatory Visit (INDEPENDENT_AMBULATORY_CARE_PROVIDER_SITE_OTHER): Payer: Medicare HMO | Admitting: Podiatry

## 2020-07-12 ENCOUNTER — Other Ambulatory Visit: Payer: Self-pay

## 2020-07-12 DIAGNOSIS — E1122 Type 2 diabetes mellitus with diabetic chronic kidney disease: Secondary | ICD-10-CM

## 2020-07-12 DIAGNOSIS — N1831 Chronic kidney disease, stage 3a: Secondary | ICD-10-CM

## 2020-07-12 DIAGNOSIS — I999 Unspecified disorder of circulatory system: Secondary | ICD-10-CM | POA: Diagnosis not present

## 2020-07-12 DIAGNOSIS — L89891 Pressure ulcer of other site, stage 1: Secondary | ICD-10-CM | POA: Diagnosis not present

## 2020-07-12 DIAGNOSIS — Z794 Long term (current) use of insulin: Secondary | ICD-10-CM

## 2020-07-12 MED ORDER — DOXYCYCLINE HYCLATE 100 MG PO TABS: 100.0000 mg | ORAL_TABLET | Freq: Two times a day (BID) | ORAL | 0 refills | Status: DC

## 2020-07-13 ENCOUNTER — Other Ambulatory Visit: Payer: Self-pay | Admitting: Family

## 2020-07-13 ENCOUNTER — Encounter: Payer: Self-pay | Admitting: Podiatry

## 2020-07-13 DIAGNOSIS — R5383 Other fatigue: Secondary | ICD-10-CM

## 2020-07-13 NOTE — Progress Notes (Signed)
Subjective:  Patient ID: Amy Calhoun, female    DOB: 03/30/1928,  MRN: [id number]  Chief Complaint  Patient presents with   Toe Pain    Left hallux toe has a place on the tip of the toe     87 y.o. female presents with the above complaint.  Patient presents with complaint of pain to the left distal aspect of the hallux.  Patient states that there is a sore spot that seems to be painful to touch.  Patient went to [facility] had an x-ray done which was negative for any kind of bone infection.  Patient does not have any ulceration present yet.  She is a diabetic with last A1c of 8.6.  She would like to get this evaluated she has not seen anyone else prior to me in terms of foot and ankle specialist.  She has not had any ABIs PVRs done in the past.  She has not been doing any kind of wound care to the area.  Her pain scale is 5 out of 10 sharp shooting in nature to the distal tip of the hallux.   Review of Systems: Negative except as noted in the HPI. Denies N/V/F/Ch.  Past Medical History:  Diagnosis Date   Allergy    Ambulates with cane    Anemia    At high risk for falls    Cataract    removed both eyes   Chronic kidney disease 08/07/2016   Chronic renal failure, stage III   Depression    Esophageal dysphagia    Gastric ulcer    GERD (gastroesophageal reflux disease)    Hyperlipidemia    Hypertension    Hypothyroidism    IDDM (insulin dependent diabetes mellitus)    Ischemic colitis (HCC)    Osteoarthritis    Ulcer of esophagus     Current Outpatient Medications:    doxycycline (VIBRA-TABS) 100 MG tablet, Take 1 tablet (100 mg total) by mouth 2 (two) times daily., Disp: 20 tablet, Rfl: 0   Acetaminophen-Codeine 300-30 MG tablet, TAKE 1 TABLET BY MOUTH EVERY 12 (TWELVE) HOURS AS NEEDED FOR PAIN (MODERATE TO SEVERE PAIN)., Disp: 60 tablet, Rfl: 1   albuterol (PROVENTIL HFA;VENTOLIN HFA) 108 (90 Base) MCG/ACT inhaler, Inhale 2 puffs into the lungs every 6 (six) hours as  needed for wheezing or shortness of breath., Disp: 1 Inhaler, Rfl: 2   amitriptyline (ELAVIL) 75 MG tablet, TAKE 2 TABLETS AT BEDTIME, Disp: 180 tablet, Rfl: 3   ascorbic acid (VITAMIN C) 500 MG tablet, Take 500 mg by mouth daily., Disp: , Rfl:    Cholecalciferol (VITAMIN D) 2000 units tablet, Take 2,000 Units by mouth daily. , Disp: , Rfl:    Continuous Blood Gluc Sensor (FREESTYLE LIBRE 14 DAY SENSOR) MISC, 1 Package by Does not apply route every 14 (fourteen) days., Disp: 6 each, Rfl: 3   CRANBERRY PO, Take 1 tablet by mouth every Monday, Wednesday, and Friday., Disp: , Rfl:    cyanocobalamin (,VITAMIN B-12,) 1000 MCG/ML injection, INJECT WEEKLY FOR 4 WEEKS THEN INJECT MONTHLY (Patient taking differently: MONTHLY), Disp: 3 mL, Rfl: 5   docusate sodium (COLACE) 100 MG capsule, Take 1 capsule (100 mg total) by mouth 2 (two) times daily. (Patient taking differently: Take 100 mg by mouth every other day.), Disp: 10 capsule, Rfl: 0   ezetimibe (ZETIA) 10 MG tablet, Take 1 tablet (10 mg total) by mouth daily., Disp: 90 tablet, Rfl: 3   gabapentin (NEURONTIN) 100 MG  capsule, Take 2 capsules (200 mg total) by mouth 2 (two) times daily., Disp: 540 capsule, Rfl: 3   glucagon (GLUCAGON EMERGENCY) 1 MG injection, Inject 1 mg into the vein once as needed. For hypoglycemic episodes. E11.42, Disp: 1 each, Rfl: 3   Insulin Glargine (LANTUS SOLOSTAR) 100 UNIT/ML Solostar Pen, Inject 8 Units into the skin at bedtime., Disp: 15 mL, Rfl: 11   insulin lispro (HUMALOG KWIKPEN) 100 UNIT/ML KwikPen, Inject 6 Units into the skin 3 (three) times daily., Disp: 15 mL, Rfl: 11   Insulin Pen Needle 32G X 4 MM MISC, 1 Device by Does not apply route in the morning, at noon, in the evening, and at bedtime., Disp: 150 each, Rfl: 11   Iron, Ferrous Sulfate, 325 (65 Fe) MG TABS, Take 325 mg by mouth every other day., Disp: 90 tablet, Rfl: 2   levothyroxine (SYNTHROID) 150 MCG tablet, TAKE 1 TABLET DAILY, Disp: 90 tablet,  Rfl: 3   lisinopril (ZESTRIL) 2.5 MG tablet, Take 1 tablet (2.5 mg total) by mouth daily. Take for blood pressure greater than 140/90, Disp: 90 tablet, Rfl: 1   Misc. Devices (BARIATRIC ROLLATOR) MISC, Use as needed, Disp: 1 each, Rfl: 0   omeprazole (PRILOSEC) 40 MG capsule, TAKE 1 CAPSULE TWICE A DAY, Disp: 180 capsule, Rfl: 3   ondansetron (ZOFRAN ODT) 4 MG disintegrating tablet, Take 1 tablet (4 mg total) by mouth every 8 (eight) hours as needed for nausea or vomiting., Disp: 30 tablet, Rfl: 0   pyridOXINE (VITAMIN B-6) 100 MG tablet, Take 100 mg by mouth daily., Disp: , Rfl:    riboflavin (VITAMIN B-2) 100 MG TABS tablet, Take 100 mg by mouth daily., Disp: , Rfl:    Turmeric 500 MG TABS, Take 750 mg by mouth 2 (two) times daily., Disp: , Rfl:   Social History   Tobacco Use  Smoking Status Never  Smokeless Tobacco Never    Allergies  Allergen Reactions   Pravachol [Pravastatin] Other (See Comments)    States she refused due to side effects   Objective:  There were no vitals filed for this visit. There is no height or weight on file to calculate BMI. Constitutional Well developed. Well nourished.  Vascular Dorsalis pedis pulses non palpable bilaterally. Posterior tibial pulses non palpable bilaterally. Capillary refill normal to all digits.  No cyanosis or clubbing noted. Pedal hair growth normal.  Neurologic Normal speech. Oriented to person, place, and time. Epicritic sensation to light touch grossly present bilaterally.  Dermatologic Nails well groomed and normal in appearance. No open wounds. No skin lesions.  Orthopedic: Mild pain on palpation to the distal tip of the left hallux.  No extensor or flexor tendinitis noted.  Good range of motion over the IPJ and MPJ of the first.  No ulceration noted.  Beginning signs of pressure sores noted.   Radiographs: Prior radiographs from [facility] were reviewed: No signs of osteomyelitis or fractures noted.  No bony  abnormalities identified. Assessment:   1. Vascular abnormality   2. Pressure injury of toe of left foot, stage 1   3. Type 2 diabetes mellitus with stage 3a chronic kidney disease, with long-term current use of insulin (HCC)    Plan:  Patient was evaluated and treated and all questions answered.  Left hallux distal tip pressure injury/pressure sore -I explained the patient the etiology of pressure injury and various treatment options were extensively discussed.  Given that patient is an uncontrolled diabetic in the setting of questionable flow  to the lower extremity this is likely a pressure injury from either close toed shoes or top of this she putting excessive pressure to the tip of the toe.  I discussed with the patient in extensive detail about open toed shoes.  She will benefit from a surgical shoe.  Surgical shoe was dispensed.  For now I have asked her to keep her triple antibiotic and a Band-Aid to keep it covered.  At this time clinically there is very mild erythema I will place her on antibiotics for 10 days.  Doxycycline was dispensed. -ABIs PVRs were ordered to assess the vascular flow  No follow-ups on file.

## 2020-07-15 ENCOUNTER — Telehealth: Payer: Self-pay | Admitting: Podiatry

## 2020-07-15 NOTE — Telephone Encounter (Signed)
Patient's  daughter called the office wanting to know how often does she need to wear her surgical shoe. I told her at least until the next visit or either she can wear it as needed. I told her if she had anymore questions or concerns to please give [place] a call back.

## 2020-07-22 ENCOUNTER — Telehealth: Payer: Self-pay | Admitting: Podiatry

## 2020-07-22 NOTE — Telephone Encounter (Signed)
Pts daughter left message on my phone on 7.1.22 stating pt seen Dr Allena Katz and was placed in a orthopedic shoe and pt is in physical therapy 3 times a week and they are asking how long should pt wear the shoe.  It looks like it was previously answered but just wanted to document.

## 2020-07-25 ENCOUNTER — Other Ambulatory Visit: Payer: Self-pay | Admitting: Family

## 2020-07-25 ENCOUNTER — Other Ambulatory Visit: Payer: Self-pay | Admitting: Gastroenterology

## 2020-07-25 DIAGNOSIS — K222 Esophageal obstruction: Secondary | ICD-10-CM

## 2020-07-25 DIAGNOSIS — M1612 Unilateral primary osteoarthritis, left hip: Secondary | ICD-10-CM

## 2020-07-25 DIAGNOSIS — R1310 Dysphagia, unspecified: Secondary | ICD-10-CM

## 2020-07-26 NOTE — Telephone Encounter (Signed)
RX Refill:tylenol #3 Last Seen:06-29-20 Last ordered:05-18-20

## 2020-07-26 NOTE — Telephone Encounter (Signed)
I looked up patient on [place] Controlled Substances Reporting System PMP AWARE and saw no activity that raised concern of inappropriate use.   

## 2020-07-27 ENCOUNTER — Encounter: Payer: Self-pay | Admitting: Gastroenterology

## 2020-07-27 ENCOUNTER — Telehealth: Payer: Self-pay | Admitting: Gastroenterology

## 2020-07-27 NOTE — Telephone Encounter (Signed)
EGD scheduled on 09/28/20 at 10:00am.

## 2020-07-27 NOTE — Telephone Encounter (Signed)
Dr Christella Hartigan please advise if she can be direct or if she needs office visit.

## 2020-07-27 NOTE — Telephone Encounter (Signed)
Pt's daughter and POA [name] called looking to scheduling direct EGD with dilation for pt. She stated that Dr. Christella Hartigan advised her to call and schedule EGD directly if pt was having trouble swallowing. Pls advise if it is ok to proceed with direct booking.

## 2020-07-27 NOTE — Telephone Encounter (Signed)
Cathy please call the pt to set up direct EGD in the [facility].  Thank you

## 2020-07-27 NOTE — Telephone Encounter (Signed)
Yes, direct [facility] EGD for dysphagia, balloon dilation is OK  Thanks

## 2020-08-09 ENCOUNTER — Ambulatory Visit: Payer: Medicare HMO | Admitting: Podiatry

## 2020-08-18 ENCOUNTER — Ambulatory Visit (INDEPENDENT_AMBULATORY_CARE_PROVIDER_SITE_OTHER): Payer: Medicare HMO

## 2020-08-18 VITALS — Ht 62.0 in | Wt 137.0 lb

## 2020-08-18 DIAGNOSIS — Z0000 Encounter for general adult medical examination without abnormal findings: Secondary | ICD-10-CM

## 2020-08-18 NOTE — Progress Notes (Signed)
Subjective:   Amy Calhoun is a 87 y.o. female who presents for Medicare Annual (Subsequent) preventive examination.  Review of Systems    No ROS.  Medicare Wellness Virtual Visit.  Visual/audio telehealth visit, UTA vital signs.   See social history for additional risk factors.   Cardiac Risk Factors include: advanced age (>7255men, 51>65 women);hypertension;diabetes mellitus     Objective:    Today's Vitals   08/18/20 1540  Weight: 137 lb (62.1 kg)  Height: 5\' 2"  (1.575 m)   Body mass index is 25.06 kg/m.  Advanced Directives 08/18/2020 08/18/2019 01/29/2019 01/28/2019 04/02/2018 03/06/2017 08/07/2016  Does Patient Have a Medical Advance Directive? Yes Yes Yes Yes Yes Yes Yes  Type of Estate agentAdvance Directive Healthcare Power of [place];Living will Healthcare Power of [place];Living will Healthcare Power of [place];Living will Living will Healthcare Power of [place];Living will Healthcare Power of [place];Living will Healthcare Power of [place];Living will  Does patient want to make changes to medical advance directive? No - Patient declined No - Patient declined No - Patient declined - No - Patient declined No - Patient declined No - Patient declined  Copy of Healthcare Power of Attorney in Chart? No - copy requested Yes - validated most recent copy scanned in chart (See row information) No - copy requested - Yes - validated most recent copy scanned in chart (See row information) Yes -  Would patient like information on creating a medical advance directive? - - - - - - -    Current Medications (verified) Outpatient Encounter Medications as of 08/18/2020  Medication Sig   Acetaminophen-Codeine 300-30 MG tablet TAKE 1 TABLET BY MOUTH EVERY 12 (TWELVE) HOURS AS NEEDED FOR PAIN (MODERATE TO SEVERE PAIN).   albuterol (PROVENTIL HFA;VENTOLIN HFA) 108 (90 Base) MCG/ACT inhaler Inhale 2 puffs into the lungs every 6 (six) hours as needed for wheezing or shortness of breath.   amitriptyline  (ELAVIL) 75 MG tablet TAKE 2 TABLETS AT BEDTIME   ascorbic acid (VITAMIN C) 500 MG tablet Take 500 mg by mouth daily.   Cholecalciferol (VITAMIN D) 2000 units tablet Take 2,000 Units by mouth daily.    Continuous Blood Gluc Sensor (FREESTYLE LIBRE 14 DAY SENSOR) MISC 1 Package by Does not apply route every 14 (fourteen) days.   CRANBERRY PO Take 1 tablet by mouth every Monday, Wednesday, and Friday.   cyanocobalamin (,VITAMIN B-12,) 1000 MCG/ML injection INJECT 1ML WEEKLY FOR 4 WEEKS THEN INJECT 1ML MONTHLY   docusate sodium (COLACE) 100 MG capsule Take 1 capsule (100 mg total) by mouth 2 (two) times daily. (Patient taking differently: Take 100 mg by mouth every other day.)   doxycycline (VIBRA-TABS) 100 MG tablet Take 1 tablet (100 mg total) by mouth 2 (two) times daily.   ezetimibe (ZETIA) 10 MG tablet Take 1 tablet (10 mg total) by mouth daily.   gabapentin (NEURONTIN) 100 MG capsule Take 2 capsules (200 mg total) by mouth 2 (two) times daily.   glucagon (GLUCAGON EMERGENCY) 1 MG injection Inject 1 mg into the vein once as needed. For hypoglycemic episodes. E11.42   Insulin Glargine (LANTUS SOLOSTAR) 100 UNIT/ML Solostar Pen Inject 8 Units into the skin at bedtime.   insulin lispro (HUMALOG KWIKPEN) 100 UNIT/ML KwikPen Inject 6 Units into the skin 3 (three) times daily.   Insulin Pen Needle 32G X 4 MM MISC 1 Device by Does not apply route in the morning, at noon, in the evening, and at bedtime.   Iron, Ferrous Sulfate, 325 (65 Fe)  MG TABS Take 325 mg by mouth every other day.   levothyroxine (SYNTHROID) 150 MCG tablet TAKE 1 TABLET DAILY   lisinopril (ZESTRIL) 2.5 MG tablet Take 1 tablet (2.5 mg total) by mouth daily. Take for blood pressure greater than 140/90   Misc. Devices (BARIATRIC ROLLATOR) MISC Use as needed   omeprazole (PRILOSEC) 40 MG capsule TAKE 1 CAPSULE TWICE A DAY   ondansetron (ZOFRAN ODT) 4 MG disintegrating tablet Take 1 tablet (4 mg total) by mouth every 8 (eight) hours as  needed for nausea or vomiting.   pyridOXINE (VITAMIN B-6) 100 MG tablet Take 100 mg by mouth daily.   riboflavin (VITAMIN B-2) 100 MG TABS tablet Take 100 mg by mouth daily.   Turmeric 500 MG TABS Take 750 mg by mouth 2 (two) times daily.   No facility-administered encounter medications on file as of 08/18/2020.    Allergies (verified) Pravachol [pravastatin]   History: Past Medical History:  Diagnosis Date   Allergy    Ambulates with cane    Anemia    At high risk for falls    Cataract    removed both eyes   Chronic kidney disease 08/07/2016   Chronic renal failure, stage III   Depression    Esophageal dysphagia    Gastric ulcer    GERD (gastroesophageal reflux disease)    Hyperlipidemia    Hypertension    Hypothyroidism    IDDM (insulin dependent diabetes mellitus)    Ischemic colitis (HCC)    Osteoarthritis    Ulcer of esophagus    Past Surgical History:  Procedure Laterality Date   ABDOMINAL HYSTERECTOMY     CATARACT EXTRACTION, BILATERAL     CHOLECYSTECTOMY     COLONOSCOPY  2010   ESOPHAGEAL DILATION  08/07/2016   usually a couple times a year, last time 07/30/16   GALLBLADDER SURGERY     LUMBAR LAMINECTOMY/DECOMPRESSION MICRODISCECTOMY N/A 08/10/2016   Procedure: BILATERAL HEMILAMINECTOMY LUMBAR THREE-FOUR,LUMBAR FOUR-FIVE,LEFT LUMBAR FIVE-SACRAL ONE HEMILAMINECTOMY AND DECOMPRESSION;  Surgeon: Coletta Memos, MD;  Location: [facility] OR;  Service: Neurosurgery;  Laterality: N/A;   THYROIDECTOMY     UPPER GASTROINTESTINAL ENDOSCOPY     Family History  Problem Relation Age of Onset   Diabetes Mother    Arthritis Mother    Hyperlipidemia Mother    Mental illness Mother    Heart disease Father    Mental illness Sister    Arthritis Maternal Grandmother    Arthritis Maternal Grandfather    Colon cancer Neg Hx    Stomach cancer Neg Hx    Esophageal cancer Neg Hx    Rectal cancer Neg Hx    Colon polyps Neg Hx    Social History   Socioeconomic History   Marital  status: Widowed    Spouse name: Not on file   Number of children: Not on file   Years of education: Not on file   Highest education level: Not on file  Occupational History   Occupation: retired  Tobacco Use   Smoking status: Never   Smokeless tobacco: Never  Vaping Use   Vaping Use: Never used  Substance and Sexual Activity   Alcohol use: No   Drug use: No   Sexual activity: Not Currently    Partners: Male  Other Topics Concern   Not on file  Social History Narrative   [facility] senior complex, independent living.    Provide her with cleaning, one meal per day.    Social Determinants of Health  Financial Resource Strain: Low Risk    Difficulty of Paying Living Expenses: Not hard at all  Food Insecurity: No Food Insecurity   Worried About Programme researcher, broadcasting/film/video in the Last Year: Never true   Ran Out of Food in the Last Year: Never true  Transportation Needs: No Transportation Needs   Lack of Transportation (Medical): No   Lack of Transportation (Non-Medical): No  Physical Activity: Sufficiently Active   Days of Exercise per Week: 3 days   Minutes of Exercise per Session: 50 min  Stress: No Stress Concern Present   Feeling of Stress : Not at all  Social Connections: Unknown   Frequency of Communication with Friends and Family: Not on file   Frequency of Social Gatherings with Friends and Family: More than three times a week   Attends Religious Services: Not on Scientist, clinical (histocompatibility and immunogenetics) or Organizations: Not on file   Attends Banker Meetings: Not on file   Marital Status: Not on file    Tobacco Counseling Counseling given: Not Answered   Clinical Intake:  Pre-visit preparation completed: Yes        Diabetes: Yes (Followed by pcp)  How often do you need to have someone help you when you read instructions, pamphlets, or other written materials from your doctor or pharmacy?: 4 - Often  Nutrition Risk Assessment:  Has the patient had any  N/V/D within the last 2 months?  No  Does the patient have any non-healing wounds?  No  Has the patient had any unintentional weight loss or weight gain?  No   Financial Strains and Diabetes Management: Are you having any financial strains with the device, your supplies or your medication? No .  Does the patient want to be seen by Chronic Care Management for management of their diabetes?  No  Would the patient like to be referred to a Nutritionist or for Diabetic Management?  No        Activities of Daily Living In your present state of health, do you have any difficulty performing the following activities: 08/18/2020  Hearing? Y  Vision? N  Difficulty concentrating or making decisions? Y  Walking or climbing stairs? Y  Dressing or bathing? Y  Doing errands, shopping? Y  Preparing Food and eating ? Y  Comment Staff assist  Using the Toilet? N  In the past six months, have you accidently leaked urine? Y  Comment Managed with daily pad  Do you have problems with loss of bowel control? N  Managing your Medications? Y  Comment Daughter assist as needed  Managing your Finances? Y  Comment Daughter assist as needed  Housekeeping or managing your Housekeeping? Y  Comment Maid assist  Some recent data might be hidden    Patient Care Team: Allegra Grana, FNP as PCP - General (Family Medicine)  Indicate any recent Medical Services you may have received from other than [facility] providers in the past year (date may be approximate).     Assessment:   This is a routine wellness examination for Amy Calhoun.  I connected with Amy Calhoun today by telephone and verified that I am speaking with the correct person using two identifiers. Location patient: home Location provider: work Persons participating in the virtual visit: patient, Engineer, civil (consulting).    I discussed the limitations, risks, security and privacy concerns of performing an evaluation and management service by telephone and the availability  of in person appointments. The patient expressed understanding and verbally  consented to this telephonic visit.    Interactive audio and video telecommunications were attempted between this provider and patient, however failed, due to patient having technical difficulties OR patient did not have access to video capability.  We continued and completed visit with audio only.  Some vital signs may be absent or patient reported.   Hearing/Vision screen Hearing Screening - Comments::  Hearing aid, bilateral  Vision Screening - Comments:: Wears corrective lenses  No retinopathy reported  Cataract extraction, bilateral  They have regular follow up with the ophthalmologist  Dietary issues and exercise activities discussed: Current Exercise Habits: Home exercise routine   Goals Addressed             This Visit's Progress    Maintain Healthy Lifestyle       Stay hydrated Stay active       Regular diet Good water intake  Depression Screen PHQ 2/9 Scores 08/18/2020 08/18/2019 02/13/2019 08/21/2018 04/02/2018 03/06/2017 10/03/2016  PHQ - 2 Score 0 0 0 0 0 0 0    Fall Risk Fall Risk  05/26/2020 08/18/2019 07/14/2019 02/13/2019 09/04/2018  Falls in the past year? 1 - 1 0 1  Number falls in past yr: 1 - 1 0 -  Injury with Fall? 0 - 0 1 -  Risk Factor Category  - - - - -  Risk for fall due to : Impaired balance/gait;History of fall(s);Impaired mobility - - - -  Risk for fall due to: Comment - - - - -  Follow up Falls evaluation completed Falls evaluation completed Falls evaluation completed Falls evaluation completed Falls evaluation completed    FALL RISK PREVENTION PERTAINING TO THE HOME: Adequate lighting in your home to reduce risk of falls? Yes   ASSISTIVE DEVICES UTILIZED TO PREVENT FALLS: Life alert? Yes  Use of a cane, walker or w/c? Yes   TIMED UP AND GO: Was the test performed? No .   Cognitive Function: MMSE - Mini Mental State Exam 08/18/2019 03/27/2017  Not completed: Unable to  complete -  Orientation to time - 5  Orientation to Place - 4  Registration - 3  Attention/ Calculation - 5  Recall - 1  Language- name 2 objects - 2  Language- repeat - 1  Language- follow 3 step command - 3  Language- read & follow direction - 1  Write a sentence - 1  Copy design - 1  Total score - 27     6CIT Screen 03/06/2017  What Year? 0 points  What month? 0 points  What time? 0 points  Count back from 20 0 points  Months in reverse 0 points    Immunizations Immunization History  Administered Date(s) Administered   Moderna Sars-Covid-2 Vaccination 05/25/2019   Pneumococcal Conjugate-13 10/14/2017   Pneumococcal Polysaccharide-23 06/29/2020    TDAP status: Due, Education has been provided regarding the importance of this vaccine. Advised may receive this vaccine at local pharmacy or Health Dept. Aware to provide a copy of the vaccination record if obtained from local pharmacy or Health Dept. Verbalized acceptance and understanding. Deferred.  Shingles vaccine- Due, Education has been provided regarding the importance of this vaccine. Advised may receive this vaccine at local pharmacy or Health Dept. Aware to provide a copy of the vaccination record if obtained from local pharmacy or Health Dept. Verbalized acceptance and understanding. Deferred.  Health Maintenance Health Maintenance  Topic Date Due   INFLUENZA VACCINE  10/04/2020 (Originally 08/15/2020)   Zoster Vaccines- Shingrix (1 of  2) 11/18/2020 (Originally 09/15/1980)   TETANUS/TDAP  08/18/2021 (Originally 09/15/1947)   HEMOGLOBIN A1C  09/16/2020   FOOT EXAM  10/13/2020   OPHTHALMOLOGY EXAM  01/27/2021   DEXA SCAN  Completed   PNA vac Low Risk Adult  Completed   HPV VACCINES  Aged Out   COVID-19 Vaccine  Discontinued   Lung Cancer Screening: (Low Dose CT Chest recommended if Age 48-80 years, 30 pack-year currently smoking OR have quit w/in 15years.) does not qualify.   Hepatitis C Screening: does not  qualify  Vision Screening: Recommended annual ophthalmology exams for early detection of glaucoma and other disorders of the eye.  Dental Screening: Recommended annual dental exams for proper oral hygiene  Community Resource Referral / Chronic Care Management: CRR required this visit?  No   CCM required this visit?  No      Plan:   Keep all routine maintenance appointments.   I have personally reviewed and noted the following in the patient's chart:   Medical and social history Use of alcohol, tobacco or illicit drugs  Current medications and supplements including opioid prescriptions. Patient is not currently taking opioid.  Functional ability and status Nutritional status Physical activity Advanced directives List of other physicians Hospitalizations, surgeries, and ER visits in previous 12 months Vitals Screenings to include cognitive, depression, and falls Referrals and appointments  In addition, I have reviewed and discussed with patient certain preventive protocols, quality metrics, and best practice recommendations. A written personalized care plan for preventive services as well as general preventive health recommendations were provided to patient via mychart.     Ashok Pall, LPN   02/19/537

## 2020-08-18 NOTE — Patient Instructions (Addendum)
  Amy Calhoun , Thank you for taking time to come for your Medicare Wellness Visit. I appreciate your ongoing commitment to your health goals. Please review the following plan we discussed and let me know if I can assist you in the future.   These are the goals we discussed:  Goals      Maintain Healthy Lifestyle     Stay hydrated Stay active        This is a list of the screening recommended for you and due dates:  Health Maintenance  Topic Date Due   Flu Shot  10/04/2020*   Zoster (Shingles) Vaccine (1 of 2) 11/18/2020*   Tetanus Vaccine  08/18/2021*   Hemoglobin A1C  09/16/2020   Complete foot exam   10/13/2020   Eye exam for diabetics  01/27/2021   DEXA scan (bone density measurement)  Completed   Pneumonia vaccines  Completed   HPV Vaccine  Aged Out   COVID-19 Vaccine  Discontinued  *Topic was postponed. The date shown is not the original due date.

## 2020-09-07 ENCOUNTER — Ambulatory Visit (AMBULATORY_SURGERY_CENTER): Payer: Medicare HMO | Admitting: *Deleted

## 2020-09-07 ENCOUNTER — Other Ambulatory Visit: Payer: Self-pay

## 2020-09-07 VITALS — Ht 62.0 in | Wt 138.0 lb

## 2020-09-07 DIAGNOSIS — R1310 Dysphagia, unspecified: Secondary | ICD-10-CM

## 2020-09-07 NOTE — Progress Notes (Signed)
Pt's previsit is done over the phone and all paperwork (prep instructions, blank consent form to just read over) sent to patient.  Pt's name and DOB verified at the beginning of the previsit.   Pt is very HOH, daughter [name] assisted with PV.  She is able to ambulated with a cane and walker.   No trouble with anesthesia, denies being told they were difficult to intubate, or hx/fam hx of malignant hyperthermia per pt   No egg or soy allergy  No home oxygen use   No medications for weight loss taken

## 2020-09-21 ENCOUNTER — Other Ambulatory Visit: Payer: Self-pay

## 2020-09-21 ENCOUNTER — Encounter: Payer: Self-pay | Admitting: Internal Medicine

## 2020-09-21 ENCOUNTER — Ambulatory Visit (INDEPENDENT_AMBULATORY_CARE_PROVIDER_SITE_OTHER): Payer: Medicare HMO | Admitting: Internal Medicine

## 2020-09-21 VITALS — BP 136/64 | HR 66 | Ht 62.0 in | Wt 132.4 lb

## 2020-09-21 DIAGNOSIS — E1142 Type 2 diabetes mellitus with diabetic polyneuropathy: Secondary | ICD-10-CM

## 2020-09-21 DIAGNOSIS — E1165 Type 2 diabetes mellitus with hyperglycemia: Secondary | ICD-10-CM

## 2020-09-21 DIAGNOSIS — E1122 Type 2 diabetes mellitus with diabetic chronic kidney disease: Secondary | ICD-10-CM | POA: Diagnosis not present

## 2020-09-21 DIAGNOSIS — E1129 Type 2 diabetes mellitus with other diabetic kidney complication: Secondary | ICD-10-CM

## 2020-09-21 DIAGNOSIS — Z794 Long term (current) use of insulin: Secondary | ICD-10-CM

## 2020-09-21 DIAGNOSIS — IMO0002 Reserved for concepts with insufficient information to code with codable children: Secondary | ICD-10-CM

## 2020-09-21 DIAGNOSIS — N1831 Chronic kidney disease, stage 3a: Secondary | ICD-10-CM

## 2020-09-21 LAB — POCT GLYCOSYLATED HEMOGLOBIN (HGB A1C): Hemoglobin A1C: 9.4 % — AB (ref 4.0–5.6)

## 2020-09-21 MED ORDER — INSULIN LISPRO (1 UNIT DIAL) 100 UNIT/ML (KWIKPEN): 8.0000 [IU] | PEN_INJECTOR | Freq: Three times a day (TID) | SUBCUTANEOUS | 3 refills | Status: DC

## 2020-09-21 NOTE — Patient Instructions (Addendum)
-   Continue  Lantus 8 Units bedtime  - Increase  Novolog 8 units with each meal  - Novolog correctional insulin:  Use the scale below to help guide you: ( Can be added before the meal) or if you forget your meal dose of Novolog   Blood sugar before meal Number of units to inject  Less than 190 0 unit  191 -  250 1 units  251 -  310 2 units  311 -  370 3 units  371 -  430 4 units  431 -  490 5 units  491 -  550 6 units        HOW TO TREAT LOW BLOOD SUGARS (Blood sugar LESS THAN 70 MG/DL) Please follow the RULE OF 15 for the treatment of hypoglycemia treatment (when your (blood sugars are less than 70 mg/dL)   STEP 1: Take 15 grams of carbohydrates when your blood sugar is low, which includes:  3-4 GLUCOSE TABS  OR 3-4 OZ OF JUICE OR REGULAR SODA OR ONE TUBE OF GLUCOSE GEL    STEP 2: RECHECK blood sugar in 15 MINUTES STEP 3: If your blood sugar is still low at the 15 minute recheck --> then, go back to STEP 1 and treat AGAIN with another 15 grams of carbohydrates.

## 2020-09-21 NOTE — Progress Notes (Signed)
Name: Amy Calhoun  Age/ Sex: 87 y.o., female   MRN/ DOB: [id number], Apr 16, 1928     PCP: Allegra Grana, FNP   Reason for Endocrinology Evaluation: Type 2 Diabetes Mellitus  Initial Endocrine Consultative Visit: 04/15/2018    PATIENT IDENTIFIER: Amy Calhoun is a 87 y.o. female with a past medical history of T2DM, Dyslipidemia, Hypothyroidism and HTN . The patient has followed with Endocrinology clinic since 04/15/2018 for consultative assistance with management of her diabetes.   Patient used to be cared for by her other daughter in [place] but daughter had passed due to a motorcycle accident and [name] brought mother to [facility] in 2017.   DIABETIC HISTORY:  [name] was diagnosed with DM at age 89. She has been on insulin since her diagnosis.She was briefly on glipizide but this was stopped in March, 2020. Pt did not recall any other oral glycemic agents. Her hemoglobin A1c has ranged from 6.5 % in 2018, peaking at 8.9%  In 2018.  Daughter would like to explore putting mother on glycemic agents, she is concerned about having MDI regimen as mother gets older and worried about future dexterity.   On her initial visit to our clinic, she was on Humulin-N BID, Humulin-R per SS and Tradjenta.  We stopped Tradjenta, started Trulicity in March, 2020, with reduction in insulin doses.   In 09/2018 Replaced Humulin-N with Lantus   SUBJECTIVE:   During the last visit (03/16/2020): A1c 8.6% no changes     Today (09/21/2020): Ms. [name] is here with daughter [name] for a follow up on diabetes .  She checks her blood sugars multiple times a day through the freestyle libre. The patient has not hypoglycemic episodes since the last clinic visit.  She has a hx of fall , the last one was 6 weeks ago , decreasing in frequency with PT  Continues with balance issues She continues with dysphagia, has hx of multiple esophageal dilatations - pending GI follow up , continues with weight loss   She  took a trip to [place] last week, had left lower extremity edema for 2 days then resolved per daughter, followed by left hand swelling which also has resolved    HOME DIABETES REGIMEN:  Lantus 8 units daily  Humalog 6 units TID QAC CF: Novolog ( BG- 130/60)   CONTINUOUS GLUCOSE MONITORING RECORD INTERPRETATION    Dates of Recording: 8/25-09/21/2020  Sensor description:Freestyle Libre  Results statistics:   CGM use % of time 82  Average and SD 254/38.7  Time in range  29 %  % Time Above 180 25  % Time above 250 46  % Time Below target 0     Glycemic patterns summary: Hyperglycemia during the day Hypoglycemic episodes occurred none  Overnight periods: trends down      HISTORY:  Past Medical History:  Past Medical History:  Diagnosis Date   Allergy    Ambulates with cane    Anemia    At high risk for falls    Cataract    removed both eyes   Chronic kidney disease 08/07/2016   Chronic renal failure, stage III   Depression    Esophageal dysphagia    Gastric ulcer    GERD (gastroesophageal reflux disease)    Hyperlipidemia    Hypertension    Hypothyroidism    IDDM (insulin dependent diabetes mellitus)    Ischemic colitis (HCC)    Neuromuscular disorder (HCC)    neuropathy   Osteoarthritis  Ulcer of esophagus    Past Surgical History:  Past Surgical History:  Procedure Laterality Date   ABDOMINAL HYSTERECTOMY     CATARACT EXTRACTION, BILATERAL     CHOLECYSTECTOMY     COLONOSCOPY  2010   ESOPHAGEAL DILATION  08/07/2016   usually a couple times a year, last time 07/30/16   GALLBLADDER SURGERY     LUMBAR LAMINECTOMY/DECOMPRESSION MICRODISCECTOMY N/A 08/10/2016   Procedure: BILATERAL HEMILAMINECTOMY LUMBAR THREE-FOUR,LUMBAR FOUR-FIVE,LEFT LUMBAR FIVE-SACRAL ONE HEMILAMINECTOMY AND DECOMPRESSION;  Surgeon: Coletta Memos, MD;  Location: [facility] OR;  Service: Neurosurgery;  Laterality: N/A;   THYROIDECTOMY     UPPER GASTROINTESTINAL ENDOSCOPY     Social History:   reports that she has never smoked. She has never used smokeless tobacco. She reports that she does not drink alcohol and does not use drugs. Family History:  Family History  Problem Relation Age of Onset   Diabetes Mother    Arthritis Mother    Hyperlipidemia Mother    Mental illness Mother    Heart disease Father    Mental illness Sister    Arthritis Maternal Grandmother    Arthritis Maternal Grandfather    Colon cancer Neg Hx    Stomach cancer Neg Hx    Esophageal cancer Neg Hx    Rectal cancer Neg Hx    Colon polyps Neg Hx      HOME MEDICATIONS: Allergies as of 09/21/2020       Reactions   Pravachol [pravastatin] Other (See Comments)   States she refused due to side effects        Medication List        Accurate as of September 21, 2020  2:02 PM. If you have any questions, ask your nurse or doctor.          Acetaminophen-Codeine 300-30 MG tablet TAKE 1 TABLET BY MOUTH EVERY 12 (TWELVE) HOURS AS NEEDED FOR PAIN (MODERATE TO SEVERE PAIN). What changed: additional instructions   albuterol 108 (90 Base) MCG/ACT inhaler Commonly known as: VENTOLIN HFA Inhale 2 puffs into the lungs every 6 (six) hours as needed for wheezing or shortness of breath.   amitriptyline 75 MG tablet Commonly known as: ELAVIL TAKE 2 TABLETS AT BEDTIME   ascorbic acid 500 MG tablet Commonly known as: VITAMIN C Take 500 mg by mouth daily.   Bariatric Rollator Misc Use as needed   CRANBERRY PO Take 1 tablet by mouth every Monday, Wednesday, and Friday.   cyanocobalamin 1000 MCG/ML injection Commonly known as: (VITAMIN B-12) INJECT WEEKLY FOR 4 WEEKS THEN INJECT MONTHLY   docusate sodium 100 MG capsule Commonly known as: COLACE Take 1 capsule (100 mg total) by mouth 2 (two) times daily.   doxycycline 100 MG tablet Commonly known as: VIBRA-TABS Take 1 tablet (100 mg total) by mouth 2 (two) times daily.   ezetimibe 10 MG tablet Commonly known as: ZETIA Take 1 tablet (10  mg total) by mouth daily.   FreeStyle Libre 14 Day Sensor Misc 1 Package by Does not apply route every 14 (fourteen) days.   gabapentin 100 MG capsule Commonly known as: NEURONTIN Take 2 capsules (200 mg total) by mouth 2 (two) times daily.   glucagon 1 MG injection Inject 1 mg into the vein once as needed. For hypoglycemic episodes. E11.42   insulin lispro 100 UNIT/ML KwikPen Commonly known as: HumaLOG KwikPen Inject 6 Units into the skin 3 (three) times daily.   Insulin Pen Needle 32G X 4 MM Misc 1  Device by Does not apply route in the morning, at noon, in the evening, and at bedtime.   Iron (Ferrous Sulfate) 325 (65 Fe) MG Tabs Take 325 mg by mouth every other day. What changed: additional instructions   Lantus SoloStar 100 UNIT/ML Solostar Pen Generic drug: insulin glargine Inject 8 Units into the skin at bedtime.   levothyroxine 150 MCG tablet Commonly known as: SYNTHROID TAKE 1 TABLET DAILY   lisinopril 2.5 MG tablet Commonly known as: ZESTRIL Take 1 tablet (2.5 mg total) by mouth daily. Take for blood pressure greater than 140/90   omeprazole 40 MG capsule Commonly known as: PRILOSEC TAKE 1 CAPSULE TWICE A DAY   ondansetron 4 MG disintegrating tablet Commonly known as: Zofran ODT Take 1 tablet (4 mg total) by mouth every 8 (eight) hours as needed for nausea or vomiting.   pyridOXINE 100 MG tablet Commonly known as: VITAMIN B-6 Take 100 mg by mouth daily.   riboflavin 100 MG Tabs tablet Commonly known as: VITAMIN B-2 Take 100 mg by mouth daily.   Turmeric 500 MG Tabs Take 750 mg by mouth 2 (two) times daily.   Vitamin D 50 MCG (2000 UT) tablet Take 2,000 Units by mouth daily.         OBJECTIVE:   Vital Signs: BP 136/64 (BP Location: Left Arm, Patient Position: Sitting, Cuff Size: Small)   Pulse 66   Ht 5\' 2"  (1.575 m)   Wt 132 lb 6.4 oz (60.1 kg)   SpO2 95%   BMI 24.22 kg/m   Wt Readings from Last 3 Encounters:  09/21/20 132 lb 6.4 oz  (60.1 kg)  09/07/20 138 lb (62.6 kg)  08/18/20 137 lb (62.1 kg)     Exam: General: Pt appears well and is in NAD  Lungs: Clear with good BS bilat   Heart: RRR   Extremities: No pretibial edema.  Neuro: MS is good with appropriate affect, pt is alert and Ox3   DM FOOT EXAM 09/21/2020 The skin of the feet is intact without sores or ulcerations. The pedal pulses are decreased. The sensation is absent  to a screening 5.07, 10 gram monofilament bilaterally    DATA REVIEWED:  Lab Results  Component Value Date   HGBA1C 9.4 (A) 09/21/2020   HGBA1C 8.6 (A) 03/16/2020   HGBA1C 8.3 (A) 11/13/2019   Lab Results  Component Value Date   MICROALBUR 1.8 03/09/2020   LDLCALC 78 10/14/2019   CREATININE 1.12 03/16/2020   Lab Results  Component Value Date   MICRALBCREAT 4.5 03/09/2020     Lab Results  Component Value Date   CHOL 160 10/14/2019   HDL 64.10 10/14/2019   LDLCALC 78 10/14/2019   TRIG 89.0 10/14/2019   CHOLHDL 2 10/14/2019       Results for Asher MuirMASON, [place] MAE (MRN [id number]) as of 03/17/2020 07:30  Ref. Range 03/16/2020 14:41  Sodium Latest Ref Range: 135 - 145 mEq/L 134 (L)  Potassium Latest Ref Range: 3.5 - 5.1 mEq/L 4.8  Chloride Latest Ref Range: 96 - 112 mEq/L 98  CO2 Latest Ref Range: 19 - 32 mEq/L 31  Glucose Latest Ref Range: 70 - 99 mg/dL 409259 (H)  BUN Latest Ref Range: 6 - 23 mg/dL 28 (H)  Creatinine Latest Ref Range: 0.40 - 1.20 mg/dL 8.111.12  Calcium Latest Ref Range: 8.4 - 10.5 mg/dL 8.8  GFR Latest Ref Range: >60.00 mL/min 43.60 (L)    ASSESSMENT / PLAN / RECOMMENDATIONS:   1) Insulin-Dependent, Poorly controlled,  With CKD III and neuropathic complications - Most recent A1c of 9.4 %. Goal A1c < 8.0%.    - In review of her CGM download she has been noted with severe hyperglycemia as soon as the day starts, it appears that there is inconsistent intake of prandial insulin, we used today as an example and daughter assures me that the mother took 6 units of  NovoLog with breakfast and 6 units of NovoLog with lunch with repeat BG close to 300 mg/DL.  Based on that information I will increase her prandial insulin -We again discussed ways of improving prandial insulin intake    MEDICATIONS:  Continue  Lantus 8 Units QHS Increase Humalog 8 units with each meal  CF: Novolog ( BG- 130/60)  EDUCATION / INSTRUCTIONS: BG monitoring instructions: Patient is instructed to check her blood sugars 4 times a day, before meals and bedtime. Call [facility] if: BG persistently < 70  I reviewed the Rule of 15 for the treatment of hypoglycemia in detail with the patient. Literature supplied.    F/U in 4 months    Signed electronically by: Lyndle Herrlich, MD  [facility] End[facility]  [facility] [address] [place] [place], [place] [address] Phone: [phone] FAX: [phone]   CC: Allegra Grana, FNP [address] [place] [address] Phone: [phone]  Fax: [phone]  Return to Endocrinology clinic as below: Future Appointments  Date Time Provider Department Center  09/28/2020 10:00 AM Rachael Fee, MD [facility]  10/19/2020  1:00 PM [facility] [place] [facility]  10/31/2020  9:00 AM [facility]  11/02/2020  3:30 PM Arnett, Lyn Records, FNP [facility]

## 2020-09-27 ENCOUNTER — Encounter: Payer: Self-pay | Admitting: Certified Registered Nurse Anesthetist

## 2020-09-27 ENCOUNTER — Other Ambulatory Visit: Payer: Self-pay

## 2020-09-27 ENCOUNTER — Telehealth: Payer: Self-pay | Admitting: Gastroenterology

## 2020-09-27 ENCOUNTER — Other Ambulatory Visit: Payer: Self-pay | Admitting: Family

## 2020-09-27 DIAGNOSIS — M1612 Unilateral primary osteoarthritis, left hip: Secondary | ICD-10-CM

## 2020-09-27 NOTE — Telephone Encounter (Signed)
Returned patient's daughters phone call.  Reviewed prep instructions and directions for insulin for upper endoscopy for tomorrow.

## 2020-09-27 NOTE — Telephone Encounter (Signed)
Sent tylenol #3 #60 no refills to pharmacy

## 2020-09-27 NOTE — Telephone Encounter (Signed)
Calling in as Patient needs this medication by tomorrow as she will be having her throat stretched in the morning.   Pended for your approval or denial.

## 2020-09-27 NOTE — Telephone Encounter (Signed)
Would you mind refilling to [facility] since Amy Calhoun is gone for the day? Last OV 06/29/20 Next OV 10/1920 Last refilled 07/26/20  Acetaminophen-Codeine 300-30 MG tablet

## 2020-09-27 NOTE — Telephone Encounter (Signed)
Patient daughter notified the medication was sent, but with no refills. She will call next month when patient is getting low on med.

## 2020-09-27 NOTE — Telephone Encounter (Signed)
Procedure is tomorrow- please route to admitting  thx

## 2020-09-28 ENCOUNTER — Encounter: Payer: Self-pay | Admitting: Gastroenterology

## 2020-09-28 ENCOUNTER — Ambulatory Visit (AMBULATORY_SURGERY_CENTER): Payer: Medicare HMO | Admitting: Gastroenterology

## 2020-09-28 ENCOUNTER — Other Ambulatory Visit: Payer: Self-pay

## 2020-09-28 VITALS — BP 183/77 | HR 73 | Temp 97.3°F | Resp 18 | Ht 62.0 in | Wt 138.0 lb

## 2020-09-28 DIAGNOSIS — R1310 Dysphagia, unspecified: Secondary | ICD-10-CM | POA: Diagnosis not present

## 2020-09-28 DIAGNOSIS — K222 Esophageal obstruction: Secondary | ICD-10-CM | POA: Diagnosis not present

## 2020-09-28 MED ORDER — SODIUM CHLORIDE 0.9 % IV SOLN
500.0000 mL | Freq: Once | INTRAVENOUS | Status: DC
Start: 2020-09-28 — End: 2020-09-28

## 2020-09-28 NOTE — Progress Notes (Signed)
Report given to PACU, vss 

## 2020-09-28 NOTE — Op Note (Signed)
[facility] Patient Name: Amy Calhoun Procedure Date: 09/28/2020 9:15 AM MRN: [id number] Endoscopist: Rachael Fee , MD Age: 87 Referring MD:  Date of Birth: 03-06-1928 Gender: Female Account #: [id number] Procedure:                Upper GI endoscopy Indications:              Dysphagia; Established care with [facility] 2017                            after move from [place].EGD 07/30/2016 with a                            benign-appearing esophageal stenosis dilated to                            40mm, patient did well for 10 months. Repeat EGD                            08/2017 found focal stricture again at GE junction,                            dilated to 24mm. Also small HH. EGD June 2020 same                            stricture was noted in the mid esophagus. Passage                            of the adult gastroscope cause minor bleeding,                            dilation of the stenosis, in addition a TTS balloon                            dilator was used to dilate the stricture to 13 mm.                            EGD 09/2019 dilation to 43mm. EGD 10/2019 dilation                            to 74mm, helped for about 8-9 months Medicines:                Monitored Anesthesia Care Procedure:                Pre-Anesthesia Assessment:                           - Prior to the procedure, a History and Physical                            was performed, and patient medications and                            allergies were reviewed. The patient's tolerance of  previous anesthesia was also reviewed. The risks                            and benefits of the procedure and the sedation                            options and risks were discussed with the patient.                            All questions were answered, and informed consent                            was obtained. Prior Anticoagulants: The patient has                            taken no  previous anticoagulant or antiplatelet                            agents. ASA Grade Assessment: III - A patient with                            severe systemic disease. After reviewing the risks                            and benefits, the patient was deemed in                            satisfactory condition to undergo the procedure.                           After obtaining informed consent, the endoscope was                            passed under direct vision. Throughout the                            procedure, the patient's blood pressure, pulse, and                            oxygen saturations were monitored continuously. The                            Endoscope was introduced through the mouth, and                            advanced to the second part of duodenum. The upper                            GI endoscopy was accomplished without difficulty.                            The patient tolerated the procedure well. Scope In: Scope Out: Findings:  One benign-appearing, intrinsic mild stenosis was                            found at the gastroesophageal junction (thick                            Schatzki's ring likely). Dilation with a TTS                            18-19-20 mm balloon dilator was performed to 20 mm.                            There was no mucosal disruption or bleeding                            following one minute balloon dilation.                           The exam was otherwise without abnormality. Complications:            No immediate complications. Estimated blood loss:                            None. Estimated Blood Loss:     Estimated blood loss: none. Impression:               - GE junction stenosis, likely a thick Schatzki's                            ring, dilated again to 55mm today.                           - The examination was otherwise normal. Recommendation:           - Patient has a contact number available for                             emergencies. The signs and symptoms of potential                            delayed complications were discussed with the                            patient. Return to normal activities tomorrow.                            Written discharge instructions were provided to the                            patient.                           - Resume previous diet. Absolutely imperative that                            she wear her dentures whenever she  eats, it is hard                            to have normal swallowing without chewing food well.                           - Continue present medications. Rachael Fee, MD 09/28/2020 9:44:10 AM This report has been signed electronically.

## 2020-09-28 NOTE — Progress Notes (Signed)
0926 Robinul 0.1 mg IV given due large amount of secretions upon assessment.  MD made aware, vss  °

## 2020-09-28 NOTE — Progress Notes (Signed)
Called to room to assist during endoscopic procedure.  Patient ID and intended procedure confirmed with present staff. Received instructions for my participation in the procedure from the performing physician.  

## 2020-09-28 NOTE — Progress Notes (Signed)
HPI: This is a woman with esophageal stricture, recurrent dysphagia   ROS: complete GI ROS as described in HPI, all other review negative.  Constitutional:  No unintentional weight loss   Past Medical History:  Diagnosis Date   Allergy    Ambulates with cane    Anemia    At high risk for falls    Cataract    removed both eyes   Chronic kidney disease 08/07/2016   Chronic renal failure, stage III   Depression    Esophageal dysphagia    Gastric ulcer    GERD (gastroesophageal reflux disease)    Hyperlipidemia    Hypertension    Hypothyroidism    IDDM (insulin dependent diabetes mellitus)    Ischemic colitis (HCC)    Neuromuscular disorder (HCC)    neuropathy   Osteoarthritis    Ulcer of esophagus     Past Surgical History:  Procedure Laterality Date   ABDOMINAL HYSTERECTOMY     CATARACT EXTRACTION, BILATERAL     CHOLECYSTECTOMY     COLONOSCOPY  2010   ESOPHAGEAL DILATION  08/07/2016   usually a couple times a year, last time 07/30/16   GALLBLADDER SURGERY     LUMBAR LAMINECTOMY/DECOMPRESSION MICRODISCECTOMY N/A 08/10/2016   Procedure: BILATERAL HEMILAMINECTOMY LUMBAR THREE-FOUR,LUMBAR FOUR-FIVE,LEFT LUMBAR FIVE-SACRAL ONE HEMILAMINECTOMY AND DECOMPRESSION;  Surgeon: Coletta Memos, MD;  Location: [facility] OR;  Service: Neurosurgery;  Laterality: N/A;   THYROIDECTOMY     UPPER GASTROINTESTINAL ENDOSCOPY      Current Outpatient Medications  Medication Sig Dispense Refill   Acetaminophen-Codeine 300-30 MG tablet TAKE 1 TABLET BY MOUTH EVERY 12 (TWELVE) HOURS AS NEEDED FOR PAIN (MODERATE TO SEVERE PAIN). 60 tablet 0   amitriptyline (ELAVIL) 75 MG tablet TAKE 2 TABLETS AT BEDTIME 180 tablet 3   ascorbic acid (VITAMIN C) 500 MG tablet Take 500 mg by mouth daily.     Cholecalciferol (VITAMIN D) 2000 units tablet Take 2,000 Units by mouth daily.      Continuous Blood Gluc Sensor (FREESTYLE LIBRE 14 DAY SENSOR) MISC 1 Package by Does not apply route every 14 (fourteen) days. 6 each  3   docusate sodium (COLACE) 100 MG capsule Take 1 capsule (100 mg total) by mouth 2 (two) times daily. 10 capsule 0   ezetimibe (ZETIA) 10 MG tablet Take 1 tablet (10 mg total) by mouth daily. 90 tablet 3   gabapentin (NEURONTIN) 100 MG capsule Take 2 capsules (200 mg total) by mouth 2 (two) times daily. 540 capsule 3   Insulin Glargine (LANTUS SOLOSTAR) 100 UNIT/ML Solostar Pen Inject 8 Units into the skin at bedtime. 15 mL 11   insulin lispro (HUMALOG KWIKPEN) 100 UNIT/ML KwikPen Inject 8 Units into the skin 3 (three) times daily. Max Daily 30 units 30 mL 3   Insulin Pen Needle 32G X 4 MM MISC 1 Device by Does not apply route in the morning, at noon, in the evening, and at bedtime. 150 each 11   Iron, Ferrous Sulfate, 325 (65 Fe) MG TABS Take 325 mg by mouth every other day. (Patient taking differently: Take 325 mg by mouth every other day. Takes daily) 90 tablet 2   levothyroxine (SYNTHROID) 150 MCG tablet TAKE 1 TABLET DAILY 90 tablet 3   Misc. Devices (BARIATRIC ROLLATOR) MISC Use as needed 1 each 0   omeprazole (PRILOSEC) 40 MG capsule TAKE 1 CAPSULE TWICE A DAY 180 capsule 3   ondansetron (ZOFRAN ODT) 4 MG disintegrating tablet Take 1 tablet (4 mg total)  by mouth every 8 (eight) hours as needed for nausea or vomiting. 30 tablet 0   pyridOXINE (VITAMIN B-6) 100 MG tablet Take 100 mg by mouth daily.     riboflavin (VITAMIN B-2) 100 MG TABS tablet Take 100 mg by mouth daily.     Turmeric 500 MG TABS Take 750 mg by mouth 2 (two) times daily.     albuterol (PROVENTIL HFA;VENTOLIN HFA) 108 (90 Base) MCG/ACT inhaler Inhale 2 puffs into the lungs every 6 (six) hours as needed for wheezing or shortness of breath. 1 Inhaler 2   CRANBERRY PO Take 1 tablet by mouth every Monday, Wednesday, and Friday. (Patient not taking: Reported on 09/28/2020)     cyanocobalamin (,VITAMIN B-12,) 1000 MCG/ML injection INJECT WEEKLY FOR 4 WEEKS THEN INJECT MONTHLY 3 mL 5   doxycycline (VIBRA-TABS) 100 MG tablet  Take 1 tablet (100 mg total) by mouth 2 (two) times daily. (Patient not taking: Reported on 09/28/2020) 20 tablet 0   glucagon (GLUCAGON EMERGENCY) 1 MG injection Inject 1 mg into the vein once as needed. For hypoglycemic episodes. E11.42 (Patient not taking: Reported on 09/28/2020) 1 each 3   lisinopril (ZESTRIL) 2.5 MG tablet Take 1 tablet (2.5 mg total) by mouth daily. Take for blood pressure greater than 140/90 (Patient not taking: Reported on 09/28/2020) 90 tablet 1   Current Facility-Administered Medications  Medication Dose Route Frequency Provider Last Rate Last Admin   0.9 %  sodium chloride infusion  500 mL Intravenous Once Rachael Fee, MD        Allergies as of 09/28/2020 - Review Complete 09/28/2020  Allergen Reaction Noted   Pravachol [pravastatin] Other (See Comments) 12/21/2015    Family History  Problem Relation Age of Onset   Diabetes Mother    Arthritis Mother    Hyperlipidemia Mother    Mental illness Mother    Heart disease Father    Mental illness Sister    Arthritis Maternal Grandmother    Arthritis Maternal Grandfather    Colon cancer Neg Hx    Stomach cancer Neg Hx    Esophageal cancer Neg Hx    Rectal cancer Neg Hx    Colon polyps Neg Hx     Social History   Socioeconomic History   Marital status: Widowed    Spouse name: Not on file   Number of children: Not on file   Years of education: Not on file   Highest education level: Not on file  Occupational History   Occupation: retired  Tobacco Use   Smoking status: Never   Smokeless tobacco: Never  Vaping Use   Vaping Use: Never used  Substance and Sexual Activity   Alcohol use: No   Drug use: No   Sexual activity: Not Currently    Partners: Male  Other Topics Concern   Not on file  Social History Narrative   [facility] senior complex, independent living.    Provide her with cleaning, one meal per day.    Social Determinants of Health   Financial Resource Strain: Low Risk    Difficulty  of Paying Living Expenses: Not hard at all  Food Insecurity: No Food Insecurity   Worried About Programme researcher, broadcasting/film/video in the Last Year: Never true   Ran Out of Food in the Last Year: Never true  Transportation Needs: No Transportation Needs   Lack of Transportation (Medical): No   Lack of Transportation (Non-Medical): No  Physical Activity: Sufficiently Active  Days of Exercise per Week: 3 days   Minutes of Exercise per Session: 50 min  Stress: No Stress Concern Present   Feeling of Stress : Not at all  Social Connections: Unknown   Frequency of Communication with Friends and Family: Not on file   Frequency of Social Gatherings with Friends and Family: More than three times a week   Attends Religious Services: Not on Scientist, clinical (histocompatibility and immunogenetics) or Organizations: Not on file   Attends Banker Meetings: Not on file   Marital Status: Not on file  Intimate Partner Violence: Not At Risk   Fear of Current or Ex-Partner: No   Emotionally Abused: No   Physically Abused: No   Sexually Abused: No     Physical Exam: BP (!) 150/70   Pulse 75   Temp (!) 97.3 F (36.3 C) (Skin)   Ht 5\' 2"  (1.575 m)   Wt 138 lb (62.6 kg)   SpO2 98%   BMI 25.24 kg/m  Constitutional: generally well-appearing Psychiatric: alert and oriented x3 Lungs: CTA bilaterally Heart: no MCR  Assessment and plan: 87 y.o. female with esohpageal stricture, recurrent dysphagia  For EGD with dilation today  Care is appropriate for the ambulatory setting.  95, MD  Gastroenterology 09/28/2020, 9:23 AM

## 2020-09-28 NOTE — Patient Instructions (Signed)
Handout on stricture given to you today  Be sure to wear dentures and chew food very well when eating  YOU HAD AN ENDOSCOPIC PROCEDURE TODAY AT THE [facility]:   Refer to the procedure report that was given to you for any specific questions about what was found during the examination.  If the procedure report does not answer your questions, please call your gastroenterologist to clarify.  If you requested that your care partner not be given the details of your procedure findings, then the procedure report has been included in a sealed envelope for you to review at your convenience later.  YOU SHOULD EXPECT: Some feelings of bloating in the abdomen. Passage of more gas than usual.  Walking can help get rid of the air that was put into your GI tract during the procedure and reduce the bloating. If you had a lower endoscopy (such as a colonoscopy or flexible sigmoidoscopy) you may notice spotting of blood in your stool or on the toilet paper. If you underwent a bowel prep for your procedure, you may not have a normal bowel movement for a few days.  Please Note:  You might notice some irritation and congestion in your nose or some drainage.  This is from the oxygen used during your procedure.  There is no need for concern and it should clear up in a day or so.  SYMPTOMS TO REPORT IMMEDIATELY:  Following upper endoscopy (EGD)  Vomiting of blood or coffee ground material  New chest pain or pain under the shoulder blades  Painful or persistently difficult swallowing  New shortness of breath  Fever of 100F or higher  Black, tarry-looking stools  For urgent or emergent issues, a gastroenterologist can be reached at any hour by calling [phone]. Do not use MyChart messaging for urgent concerns.    DIET:  We do recommend a small meal at first, but then you may proceed to your regular diet.  Drink plenty of fluids but you should avoid alcoholic beverages for 24 hours.  ACTIVITY:   You should plan to take it easy for the rest of today and you should NOT DRIVE or use heavy machinery until tomorrow (because of the sedation medicines used during the test).    FOLLOW UP: Our staff will call the number listed on your records 48-72 hours following your procedure to check on you and address any questions or concerns that you may have regarding the information given to you following your procedure. If we do not reach you, we will leave a message.  We will attempt to reach you two times.  During this call, we will ask if you have developed any symptoms of COVID 19. If you develop any symptoms (ie: fever, flu-like symptoms, shortness of breath, cough etc.) before then, please call [phone].  If you test positive for Covid 19 in the 2 weeks post procedure, please call and report this information to [place].     SIGNATURES/CONFIDENTIALITY: You and/or your care partner have signed paperwork which will be entered into your electronic medical record.  These signatures attest to the fact that that the information above on your After Visit Summary has been reviewed and is understood.  Full responsibility of the confidentiality of this discharge information lies with you and/or your care-partner.

## 2020-09-30 ENCOUNTER — Telehealth: Payer: Self-pay

## 2020-09-30 NOTE — Telephone Encounter (Signed)
First post procedure follow up call, no answer 

## 2020-09-30 NOTE — Telephone Encounter (Signed)
  Follow up Call-  Call back number 09/28/2020 10/28/2019 09/22/2019 06/27/2018  Post procedure Call Back phone  # [phone] #[phone] [name] - Pt's daughter [phone]  Permission to leave phone message Yes Yes Yes Yes  Some recent data might be hidden     Patient questions:  Do you have a fever, pain , or abdominal swelling? No. Pain Score  0 *  Have you tolerated food without any problems? Yes.    Have you been able to return to your normal activities? Yes.    Do you have any questions about your discharge instructions: Diet   No. Medications  No. Follow up visit  No.  Do you have questions or concerns about your Care? No.  Actions: * If pain score is 4 or above: No action needed, pain <4.

## 2020-10-05 ENCOUNTER — Encounter: Payer: Self-pay | Admitting: Internal Medicine

## 2020-10-10 ENCOUNTER — Emergency Department: Payer: Medicare HMO

## 2020-10-10 ENCOUNTER — Other Ambulatory Visit: Payer: Self-pay | Admitting: Family

## 2020-10-10 ENCOUNTER — Inpatient Hospital Stay
Admission: EM | Admit: 2020-10-10 | Discharge: 2020-10-17 | DRG: 242 | Disposition: A | Discharge status: Home Health | Payer: Medicare HMO | Attending: Internal Medicine | Admitting: Internal Medicine

## 2020-10-10 DIAGNOSIS — R001 Bradycardia, unspecified: Secondary | ICD-10-CM

## 2020-10-10 DIAGNOSIS — N183 Chronic kidney disease, stage 3 unspecified: Secondary | ICD-10-CM | POA: Diagnosis present

## 2020-10-10 DIAGNOSIS — Z794 Long term (current) use of insulin: Secondary | ICD-10-CM

## 2020-10-10 DIAGNOSIS — I130 Hypertensive heart and chronic kidney disease with heart failure and stage 1 through stage 4 chronic kidney disease, or unspecified chronic kidney disease: Secondary | ICD-10-CM | POA: Diagnosis present

## 2020-10-10 DIAGNOSIS — E1122 Type 2 diabetes mellitus with diabetic chronic kidney disease: Secondary | ICD-10-CM | POA: Diagnosis present

## 2020-10-10 DIAGNOSIS — R29898 Other symptoms and signs involving the musculoskeletal system: Secondary | ICD-10-CM | POA: Diagnosis present

## 2020-10-10 DIAGNOSIS — N179 Acute kidney failure, unspecified: Secondary | ICD-10-CM | POA: Diagnosis not present

## 2020-10-10 DIAGNOSIS — I160 Hypertensive urgency: Secondary | ICD-10-CM | POA: Diagnosis present

## 2020-10-10 DIAGNOSIS — N170 Acute kidney failure with tubular necrosis: Secondary | ICD-10-CM | POA: Diagnosis present

## 2020-10-10 DIAGNOSIS — E1165 Type 2 diabetes mellitus with hyperglycemia: Secondary | ICD-10-CM

## 2020-10-10 DIAGNOSIS — E1142 Type 2 diabetes mellitus with diabetic polyneuropathy: Secondary | ICD-10-CM

## 2020-10-10 DIAGNOSIS — R269 Unspecified abnormalities of gait and mobility: Secondary | ICD-10-CM

## 2020-10-10 DIAGNOSIS — N1831 Chronic kidney disease, stage 3a: Secondary | ICD-10-CM | POA: Diagnosis not present

## 2020-10-10 DIAGNOSIS — F32A Depression, unspecified: Secondary | ICD-10-CM | POA: Diagnosis present

## 2020-10-10 DIAGNOSIS — Z9841 Cataract extraction status, right eye: Secondary | ICD-10-CM

## 2020-10-10 DIAGNOSIS — R5383 Other fatigue: Secondary | ICD-10-CM | POA: Diagnosis present

## 2020-10-10 DIAGNOSIS — K219 Gastro-esophageal reflux disease without esophagitis: Secondary | ICD-10-CM | POA: Diagnosis present

## 2020-10-10 DIAGNOSIS — I951 Orthostatic hypotension: Secondary | ICD-10-CM | POA: Diagnosis not present

## 2020-10-10 DIAGNOSIS — Z6824 Body mass index (BMI) 24.0-24.9, adult: Secondary | ICD-10-CM

## 2020-10-10 DIAGNOSIS — R059 Cough, unspecified: Secondary | ICD-10-CM

## 2020-10-10 DIAGNOSIS — E039 Hypothyroidism, unspecified: Secondary | ICD-10-CM | POA: Diagnosis present

## 2020-10-10 DIAGNOSIS — R55 Syncope and collapse: Secondary | ICD-10-CM | POA: Diagnosis not present

## 2020-10-10 DIAGNOSIS — Z950 Presence of cardiac pacemaker: Secondary | ICD-10-CM

## 2020-10-10 DIAGNOSIS — E785 Hyperlipidemia, unspecified: Secondary | ICD-10-CM | POA: Diagnosis present

## 2020-10-10 DIAGNOSIS — Z79890 Hormone replacement therapy (postmenopausal): Secondary | ICD-10-CM

## 2020-10-10 DIAGNOSIS — Z8249 Family history of ischemic heart disease and other diseases of the circulatory system: Secondary | ICD-10-CM

## 2020-10-10 DIAGNOSIS — Z20822 Contact with and (suspected) exposure to covid-19: Secondary | ICD-10-CM | POA: Diagnosis present

## 2020-10-10 DIAGNOSIS — R0600 Dyspnea, unspecified: Secondary | ICD-10-CM

## 2020-10-10 DIAGNOSIS — E1169 Type 2 diabetes mellitus with other specified complication: Secondary | ICD-10-CM | POA: Diagnosis present

## 2020-10-10 DIAGNOSIS — E440 Moderate protein-calorie malnutrition: Secondary | ICD-10-CM | POA: Insufficient documentation

## 2020-10-10 DIAGNOSIS — I5033 Acute on chronic diastolic (congestive) heart failure: Secondary | ICD-10-CM | POA: Diagnosis not present

## 2020-10-10 DIAGNOSIS — M961 Postlaminectomy syndrome, not elsewhere classified: Secondary | ICD-10-CM | POA: Diagnosis present

## 2020-10-10 DIAGNOSIS — Z9842 Cataract extraction status, left eye: Secondary | ICD-10-CM

## 2020-10-10 DIAGNOSIS — E875 Hyperkalemia: Secondary | ICD-10-CM | POA: Diagnosis not present

## 2020-10-10 DIAGNOSIS — E871 Hypo-osmolality and hyponatremia: Secondary | ICD-10-CM | POA: Diagnosis not present

## 2020-10-10 DIAGNOSIS — Z8261 Family history of arthritis: Secondary | ICD-10-CM

## 2020-10-10 DIAGNOSIS — Z888 Allergy status to other drugs, medicaments and biological substances status: Secondary | ICD-10-CM

## 2020-10-10 DIAGNOSIS — E538 Deficiency of other specified B group vitamins: Secondary | ICD-10-CM | POA: Diagnosis present

## 2020-10-10 DIAGNOSIS — F419 Anxiety disorder, unspecified: Secondary | ICD-10-CM | POA: Diagnosis present

## 2020-10-10 DIAGNOSIS — Z833 Family history of diabetes mellitus: Secondary | ICD-10-CM

## 2020-10-10 DIAGNOSIS — I10 Essential (primary) hypertension: Secondary | ICD-10-CM | POA: Diagnosis present

## 2020-10-10 DIAGNOSIS — J9611 Chronic respiratory failure with hypoxia: Secondary | ICD-10-CM | POA: Diagnosis present

## 2020-10-10 DIAGNOSIS — K224 Dyskinesia of esophagus: Secondary | ICD-10-CM | POA: Diagnosis present

## 2020-10-10 DIAGNOSIS — I442 Atrioventricular block, complete: Secondary | ICD-10-CM | POA: Diagnosis not present

## 2020-10-10 DIAGNOSIS — Z79899 Other long term (current) drug therapy: Secondary | ICD-10-CM

## 2020-10-10 DIAGNOSIS — R739 Hyperglycemia, unspecified: Secondary | ICD-10-CM

## 2020-10-10 DIAGNOSIS — I25118 Atherosclerotic heart disease of native coronary artery with other forms of angina pectoris: Secondary | ICD-10-CM | POA: Diagnosis present

## 2020-10-10 DIAGNOSIS — Z83438 Family history of other disorder of lipoprotein metabolism and other lipidemia: Secondary | ICD-10-CM

## 2020-10-10 LAB — CBC WITH DIFFERENTIAL/PLATELET
Abs Immature Granulocytes: 0.02 10*3/uL (ref 0.00–0.07)
Basophils Absolute: 0 10*3/uL (ref 0.0–0.1)
Basophils Relative: 1 %
Eosinophils Absolute: 0.1 10*3/uL (ref 0.0–0.5)
Eosinophils Relative: 1 %
HCT: 36.4 % (ref 36.0–46.0)
Hemoglobin: 12 g/dL (ref 12.0–15.0)
Immature Granulocytes: 0 %
Lymphocytes Relative: 33 %
Lymphs Abs: 2.9 10*3/uL (ref 0.7–4.0)
MCH: 30.5 pg (ref 26.0–34.0)
MCHC: 33 g/dL (ref 30.0–36.0)
MCV: 92.4 fL (ref 80.0–100.0)
Monocytes Absolute: 0.7 10*3/uL (ref 0.1–1.0)
Monocytes Relative: 8 %
Neutro Abs: 4.9 10*3/uL (ref 1.7–7.7)
Neutrophils Relative %: 57 %
Platelets: 184 10*3/uL (ref 150–400)
RBC: 3.94 MIL/uL (ref 3.87–5.11)
RDW: 13 % (ref 11.5–15.5)
WBC: 8.6 10*3/uL (ref 4.0–10.5)
nRBC: 0 % (ref 0.0–0.2)

## 2020-10-10 LAB — COMPREHENSIVE METABOLIC PANEL
ALT: 17 U/L (ref 0–44)
AST: 21 U/L (ref 15–41)
Albumin: 3.9 g/dL (ref 3.5–5.0)
Alkaline Phosphatase: 65 U/L (ref 38–126)
Anion gap: 8 (ref 5–15)
BUN: 37 mg/dL — ABNORMAL HIGH (ref 8–23)
CO2: 27 mmol/L (ref 22–32)
Calcium: 8.5 mg/dL — ABNORMAL LOW (ref 8.9–10.3)
Chloride: 100 mmol/L (ref 98–111)
Creatinine, Ser: 1.12 mg/dL — ABNORMAL HIGH (ref 0.44–1.00)
GFR, Estimated: 47 mL/min — ABNORMAL LOW (ref >60)
Glucose, Bld: 360 mg/dL — ABNORMAL HIGH (ref 70–99)
Potassium: 4.5 mmol/L (ref 3.5–5.1)
Sodium: 135 mmol/L (ref 135–145)
Total Bilirubin: 0.5 mg/dL (ref 0.3–1.2)
Total Protein: 6.9 g/dL (ref 6.5–8.1)

## 2020-10-10 LAB — VITAMIN B12: Vitamin B-12: 912 pg/mL (ref 180–914)

## 2020-10-10 LAB — URINALYSIS, ROUTINE W REFLEX MICROSCOPIC
Bilirubin Urine: NEGATIVE
Glucose, UA: 50 mg/dL — AB
Hgb urine dipstick: NEGATIVE
Ketones, ur: NEGATIVE mg/dL
Leukocytes,Ua: NEGATIVE
Nitrite: NEGATIVE
Protein, ur: NEGATIVE mg/dL
Specific Gravity, Urine: 1.01 (ref 1.005–1.030)
pH: 5 (ref 5.0–8.0)

## 2020-10-10 LAB — CBG MONITORING, ED
Glucose-Capillary: 248 mg/dL — ABNORMAL HIGH (ref 70–99)
Glucose-Capillary: 211 mg/dL — ABNORMAL HIGH (ref 70–99)

## 2020-10-10 LAB — RESP PANEL BY RT-PCR (FLU A&B, COVID) ARPGX2
Influenza A by PCR: NEGATIVE
Influenza B by PCR: NEGATIVE
SARS Coronavirus 2 by RT PCR: NEGATIVE

## 2020-10-10 LAB — MAGNESIUM: Magnesium: 1.9 mg/dL (ref 1.7–2.4)

## 2020-10-10 LAB — BETA-HYDROXYBUTYRIC ACID: Beta-Hydroxybutyric Acid: 0.14 mmol/L (ref 0.05–0.27)

## 2020-10-10 LAB — TROPONIN I (HIGH SENSITIVITY)
Troponin I (High Sensitivity): 8 ng/L (ref <18)
Troponin I (High Sensitivity): 9 ng/L (ref <18)

## 2020-10-10 LAB — TSH: TSH: 3.281 u[IU]/mL (ref 0.350–4.500)

## 2020-10-10 LAB — T4, FREE: Free T4: 1.06 ng/dL (ref 0.61–1.12)

## 2020-10-10 LAB — D-DIMER, QUANTITATIVE: D-Dimer, Quant: 0.7 ug/mL-FEU — ABNORMAL HIGH (ref 0.00–0.50)

## 2020-10-10 LAB — PROCALCITONIN: Procalcitonin: <0.1 ng/mL

## 2020-10-10 MED ORDER — SODIUM CHLORIDE 0.9 % IV BOLUS: 1000.0000 mL | Freq: Once | INTRAVENOUS | Status: DC

## 2020-10-10 MED ORDER — EZETIMIBE 10 MG PO TABS
10.0000 mg | ORAL_TABLET | Freq: Every day | ORAL | Status: DC
  Administered 2020-10-10 – 2020-10-17 (×7): 10 mg via ORAL
  Filled 2020-10-10 (×8): qty 1

## 2020-10-10 MED ORDER — FERROUS SULFATE 325 (65 FE) MG PO TABS
325.0000 mg | ORAL_TABLET | Freq: Every day | ORAL | Status: DC
  Administered 2020-10-11 – 2020-10-17 (×5): 325 mg via ORAL
  Filled 2020-10-10 (×6): qty 1

## 2020-10-10 MED ORDER — DOCUSATE SODIUM 100 MG PO CAPS
100.0000 mg | ORAL_CAPSULE | Freq: Two times a day (BID) | ORAL | Status: DC
  Administered 2020-10-10 – 2020-10-17 (×11): 100 mg via ORAL
  Filled 2020-10-10 (×12): qty 1

## 2020-10-10 MED ORDER — DIPHENHYDRAMINE HCL 12.5 MG/5ML PO ELIX
12.5000 mg | ORAL_SOLUTION | Freq: Once | ORAL | Status: DC
Start: 2020-10-10 — End: 2020-10-10

## 2020-10-10 MED ORDER — INSULIN ASPART 100 UNIT/ML IJ SOLN
0.0000 [IU] | Freq: Three times a day (TID) | INTRAMUSCULAR | Status: DC
  Administered 2020-10-11: 8 [IU] via SUBCUTANEOUS
  Administered 2020-10-11: 5 [IU] via SUBCUTANEOUS
  Administered 2020-10-12: 11 [IU] via SUBCUTANEOUS
  Administered 2020-10-12: 15 [IU] via SUBCUTANEOUS
  Filled 2020-10-10 (×4): qty 1

## 2020-10-10 MED ORDER — HYDRALAZINE HCL 50 MG PO TABS: 25.0000 mg | ORAL_TABLET | Freq: Four times a day (QID) | ORAL | Status: DC | PRN

## 2020-10-10 MED ORDER — HYDROCERIN EX CREA
TOPICAL_CREAM | Freq: Two times a day (BID) | CUTANEOUS | Status: AC
  Filled 2020-10-10 (×2): qty 113

## 2020-10-10 MED ORDER — ALBUTEROL SULFATE (2.5 MG/3ML) 0.083% IN NEBU
3.0000 mL | INHALATION_SOLUTION | Freq: Four times a day (QID) | RESPIRATORY_TRACT | Status: DC | PRN
  Administered 2020-10-14 – 2020-10-16 (×3): 3 mL via RESPIRATORY_TRACT
  Filled 2020-10-10 (×3): qty 3

## 2020-10-10 MED ORDER — PANTOPRAZOLE SODIUM 40 MG PO TBEC
80.0000 mg | DELAYED_RELEASE_TABLET | Freq: Every day | ORAL | Status: DC
  Administered 2020-10-10 – 2020-10-17 (×8): 80 mg via ORAL
  Filled 2020-10-10 (×8): qty 2

## 2020-10-10 MED ORDER — ENOXAPARIN SODIUM 40 MG/0.4ML IJ SOSY: 40.0000 mg | PREFILLED_SYRINGE | INTRAMUSCULAR | Status: DC

## 2020-10-10 MED ORDER — VITAMIN B-6 50 MG PO TABS
100.0000 mg | ORAL_TABLET | Freq: Every day | ORAL | Status: DC
  Administered 2020-10-11 – 2020-10-17 (×6): 100 mg via ORAL
  Filled 2020-10-10 (×7): qty 2

## 2020-10-10 MED ORDER — INSULIN GLARGINE-YFGN 100 UNIT/ML ~~LOC~~ SOLN
8.0000 [IU] | Freq: Every day | SUBCUTANEOUS | Status: DC
  Administered 2020-10-10 – 2020-10-11 (×2): 8 [IU] via SUBCUTANEOUS
  Filled 2020-10-10 (×3): qty 0.08

## 2020-10-10 MED ORDER — INSULIN ASPART 100 UNIT/ML IJ SOLN
0.0000 [IU] | Freq: Every day | INTRAMUSCULAR | Status: DC
  Administered 2020-10-11: 4 [IU] via SUBCUTANEOUS
  Filled 2020-10-10: qty 1

## 2020-10-10 MED ORDER — LISINOPRIL 5 MG PO TABS
2.5000 mg | ORAL_TABLET | Freq: Every day | ORAL | Status: DC
  Administered 2020-10-10 – 2020-10-12 (×2): 2.5 mg via ORAL
  Filled 2020-10-10 (×3): qty 1

## 2020-10-10 MED ORDER — LEVOTHYROXINE SODIUM 50 MCG PO TABS
150.0000 ug | ORAL_TABLET | Freq: Every day | ORAL | Status: DC
  Administered 2020-10-11 – 2020-10-17 (×7): 150 ug via ORAL
  Filled 2020-10-10: qty 1
  Filled 2020-10-10: qty 3
  Filled 2020-10-10 (×5): qty 1
  Filled 2020-10-10: qty 3

## 2020-10-10 MED ORDER — ENOXAPARIN SODIUM 30 MG/0.3ML IJ SOSY
30.0000 mg | PREFILLED_SYRINGE | INTRAMUSCULAR | Status: DC
Start: 2020-10-10 — End: 2020-10-11
  Administered 2020-10-10: 30 mg via SUBCUTANEOUS
  Filled 2020-10-10: qty 0.3

## 2020-10-10 MED ORDER — HYDROCORTISONE 1 % EX CREA
TOPICAL_CREAM | Freq: Two times a day (BID) | CUTANEOUS | Status: DC
  Filled 2020-10-10: qty 28

## 2020-10-10 MED ORDER — SODIUM CHLORIDE 0.9% FLUSH
3.0000 mL | Freq: Two times a day (BID) | INTRAVENOUS | Status: DC
  Administered 2020-10-10 – 2020-10-17 (×11): 3 mL via INTRAVENOUS

## 2020-10-10 MED ORDER — AMITRIPTYLINE HCL 25 MG PO TABS
150.0000 mg | ORAL_TABLET | Freq: Every day | ORAL | Status: DC
  Administered 2020-10-10 – 2020-10-16 (×7): 150 mg via ORAL
  Filled 2020-10-10: qty 2
  Filled 2020-10-10: qty 6
  Filled 2020-10-10 (×3): qty 2
  Filled 2020-10-10: qty 3
  Filled 2020-10-10: qty 6

## 2020-10-10 MED ORDER — SODIUM CHLORIDE 0.9 % IV BOLUS
500.0000 mL | Freq: Once | INTRAVENOUS | Status: AC
  Administered 2020-10-10: 500 mL via INTRAVENOUS

## 2020-10-10 NOTE — Progress Notes (Signed)
PHARMACIST - PHYSICIAN COMMUNICATION  CONCERNING:  Enoxaparin (Lovenox) for DVT Prophylaxis    RECOMMENDATION: Patient was prescribed enoxaprin 40mg  q24 hours for VTE prophylaxis.   Filed Weights   10/10/20 1620  Weight: 55 kg (121 lb 4.1 oz)    Body mass index is 18.99 kg/m.  Estimated Creatinine Clearance: 29 mL/min (A) (by C-G formula based on SCr of 1.12 mg/dL (H)).    Patient is candidate for enoxaparin 30mg  every 24 hours based on CrCl <39ml/min   DESCRIPTION: Pharmacy has adjusted enoxaparin dose per [facility] policy.  Patient is now receiving enoxaparin 30 mg every 24 hours   31m, PharmD, BCPS Clinical Pharmacist 10/10/2020 7:26 PM

## 2020-10-10 NOTE — ED Provider Notes (Signed)
[facility] Provider Note  ____________________________________________   Event Date/Time   First MD Initiated Contact with Patient 10/10/20 1618     (approximate)  I have reviewed the triage vital signs and the nursing notes.   HISTORY  Chief Complaint Weakness (Blood sugar 355, weakness more than normal, near syncopal, brady )    HPI Amy [facility] is a 87 y.o. female with history of CKD, depression, hypertension, hyperlipidemia who comes in with concerns for weakness.  Patient reportedly had a near syncopal episode witnessed by family.  They noted that she was more weak than normal, unclear onset, nothing makes it better or worse.  Heart rates have been lower than normal was in the 40s but they state that her normals are in the 50s to 60s.  They have also noted some elevated sugars as well in the 300s.  Patient herself is alert and oriented x3 but she states that her daughter knows more about what happened today.  She denies any falls and hitting her head, chest pain, shortness of breath, abdominal pain, leg swelling.          Past Medical History:  Diagnosis Date   Allergy    Ambulates with cane    Anemia    At high risk for falls    Cataract    removed both eyes   Chronic kidney disease 08/07/2016   Chronic renal failure, stage III   Depression    Esophageal dysphagia    Gastric ulcer    GERD (gastroesophageal reflux disease)    Hyperlipidemia    Hypertension    Hypothyroidism    IDDM (insulin dependent diabetes mellitus)    Ischemic colitis (HCC)    Neuromuscular disorder (HCC)    neuropathy   Osteoarthritis    Ulcer of esophagus     Patient Active Problem List   Diagnosis Date Noted   Redness of skin 06/29/2020   Recurrent falls 06/06/2020   Abnormal gait 06/06/2020   Dizziness 06/06/2020   Right wrist pain 03/18/2020   B12 deficiency 03/11/2020   Type 2 diabetes mellitus with hyperglycemia, with  long-term current use of insulin (HCC) 11/16/2019   Type 2 diabetes mellitus with stage 3a chronic kidney disease, with long-term current use of insulin (HCC) 11/16/2019   DM type 2 with diabetic peripheral neuropathy (HCC) 11/16/2019   Depression    Weakness of both lower extremities    Fatigue 02/13/2019   Head injury 02/04/2019   Acute hypoxemic respiratory failure due to COVID-19 (HCC) 01/28/2019   Gastroesophageal reflux disease without esophagitis    Insomnia 01/05/2019   Constipation 06/02/2018   Acute pain of left shoulder 05/23/2018   Diarrhea 05/23/2018   Pneumatosis coli 07/29/2017   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   Memory disturbance 03/27/2017   Recurrent urinary tract infection 10/17/2016   Lumbar stenosis with neurogenic claudication 08/10/2016   Anemia 07/06/2016   Shortness of breath 03/31/2016   Osteoarthritis of spine with radiculopathy, lumbar region 03/16/2016   History of colitis 03/02/2016   Primary osteoarthritis of left hip 03/02/2016   CKD (chronic kidney disease) stage 3, GFR 30-59 ml/min (HCC) 02/21/2016   DM (diabetes mellitus), type 2, uncontrolled, with renal complications (HCC) 01/19/2016   Hyperlipidemia 01/19/2016   Hypothyroidism 01/19/2016   HTN (hypertension), malignant 01/19/2016   Chronic pain 01/19/2016   Esophageal dysmotility 12/21/2015    Past Surgical History:  Procedure Laterality Date   ABDOMINAL HYSTERECTOMY     CATARACT EXTRACTION,  BILATERAL     CHOLECYSTECTOMY     COLONOSCOPY  2010   ESOPHAGEAL DILATION  08/07/2016   usually a couple times a year, last time 07/30/16   GALLBLADDER SURGERY     LUMBAR LAMINECTOMY/DECOMPRESSION MICRODISCECTOMY N/A 08/10/2016   Procedure: BILATERAL HEMILAMINECTOMY LUMBAR THREE-FOUR,LUMBAR FOUR-FIVE,LEFT LUMBAR FIVE-SACRAL ONE HEMILAMINECTOMY AND DECOMPRESSION;  Surgeon: Coletta Memos, MD;  Location: [facility] OR;  Service: Neurosurgery;  Laterality: N/A;   THYROIDECTOMY     UPPER GASTROINTESTINAL ENDOSCOPY       Prior to Admission medications   Medication Sig Start Date End Date Taking? Authorizing Provider  Acetaminophen-Codeine 300-30 MG tablet TAKE 1 TABLET BY MOUTH EVERY 12 (TWELVE) HOURS AS NEEDED FOR PAIN (MODERATE TO SEVERE PAIN). 09/27/20   Dale Washakie, MD  albuterol (PROVENTIL HFA;VENTOLIN HFA) 108 (90 Base) MCG/ACT inhaler Inhale 2 puffs into the lungs every 6 (six) hours as needed for wheezing or shortness of breath. 01/07/17   Allegra Grana, FNP  amitriptyline (ELAVIL) 75 MG tablet TAKE 2 TABLETS AT BEDTIME 03/11/20   Allegra Grana, FNP  ascorbic acid (VITAMIN C) 500 MG tablet Take 500 mg by mouth daily.    [provider]  Cholecalciferol (VITAMIN D) 2000 units tablet Take 2,000 Units by mouth daily.     [provider]  Continuous Blood Gluc Sensor (FREESTYLE LIBRE 14 DAY SENSOR) MISC 1 Package by Does not apply route every 14 (fourteen) days. 09/03/19   Shamleffer, Konrad Dolores, MD  CRANBERRY PO Take 1 tablet by mouth every Monday, Wednesday, and Friday. Patient not taking: Reported on 09/28/2020    [provider]  cyanocobalamin (,VITAMIN B-12,) 1000 MCG/ML injection INJECT WEEKLY FOR 4 WEEKS THEN INJECT MONTHLY 07/13/20   Allegra Grana, FNP  docusate sodium (COLACE) 100 MG capsule Take 1 capsule (100 mg total) by mouth 2 (two) times daily. 03/31/16   Katharina Caper, MD  doxycycline (VIBRA-TABS) 100 MG tablet Take 1 tablet (100 mg total) by mouth 2 (two) times daily. Patient not taking: Reported on 09/28/2020 07/12/20   Candelaria Stagers, DPM  ezetimibe (ZETIA) 10 MG tablet Take 1 tablet (10 mg total) by mouth daily. 06/29/20   Allegra Grana, FNP  gabapentin (NEURONTIN) 100 MG capsule Take 2 capsules (200 mg total) by mouth 2 (two) times daily. 04/22/20   Allegra Grana, FNP  glucagon (GLUCAGON EMERGENCY) 1 MG injection Inject 1 mg into the vein once as needed. For hypoglycemic episodes. E11.42 Patient not taking: Reported on  09/28/2020 03/13/16   Tommie Sams, DO  Insulin Glargine (LANTUS SOLOSTAR) 100 UNIT/ML Solostar Pen Inject 8 Units into the skin at bedtime. 01/01/19   Shamleffer, Konrad Dolores, MD  insulin lispro (HUMALOG KWIKPEN) 100 UNIT/ML KwikPen Inject 8 Units into the skin 3 (three) times daily. Max Daily 30 units 09/21/20   Shamleffer, Konrad Dolores, MD  Insulin Pen Needle 32G X 4 MM MISC 1 Device by Does not apply route in the morning, at noon, in the evening, and at bedtime. 11/13/19   Shamleffer, Konrad Dolores, MD  Iron, Ferrous Sulfate, 325 (65 Fe) MG TABS Take 325 mg by mouth every other day. Patient taking differently: Take 325 mg by mouth every other day. Takes daily 05/26/20   McLean-Scocuzza, Pasty Spillers, MD  levothyroxine (SYNTHROID) 150 MCG tablet TAKE 1 TABLET DAILY 01/28/20   Allegra Grana, FNP  lisinopril (ZESTRIL) 2.5 MG tablet Take 1 tablet (2.5 mg total) by mouth daily. Take for blood pressure greater  than 140/90 Patient not taking: Reported on 09/28/2020 07/14/19   Allegra Grana, FNP  Misc. Devices (BARIATRIC ROLLATOR) MISC Use as needed 07/31/19   Allegra Grana, FNP  omeprazole (PRILOSEC) 40 MG capsule TAKE 1 CAPSULE TWICE A DAY 07/25/20   Rachael Fee, MD  ondansetron (ZOFRAN ODT) 4 MG disintegrating tablet Take 1 tablet (4 mg total) by mouth every 8 (eight) hours as needed for nausea or vomiting. 08/21/18   Tracey Harries, FNP  pyridOXINE (VITAMIN B-6) 100 MG tablet Take 100 mg by mouth daily.    [provider]  riboflavin (VITAMIN B-2) 100 MG TABS tablet Take 100 mg by mouth daily.    [provider]  Turmeric 500 MG TABS Take 750 mg by mouth 2 (two) times daily.    [provider]    Allergies Pravachol [pravastatin]  Family History  Problem Relation Age of Onset   Diabetes Mother    Arthritis Mother    Hyperlipidemia Mother    Mental illness Mother    Heart disease Father    Mental illness Sister    Arthritis Maternal Grandmother     Arthritis Maternal Grandfather    Colon cancer Neg Hx    Stomach cancer Neg Hx    Esophageal cancer Neg Hx    Rectal cancer Neg Hx    Colon polyps Neg Hx     Social History Social History   Tobacco Use   Smoking status: Never   Smokeless tobacco: Never  Vaping Use   Vaping Use: Never used  Substance Use Topics   Alcohol use: No   Drug use: No      Review of Systems Constitutional: No fever/chills, weakness, near syncope Eyes: No visual changes. ENT: No sore throat. Cardiovascular: Denies chest pain. Respiratory: Denies shortness of breath. Gastrointestinal: No abdominal pain.  No nausea, no vomiting.  No diarrhea.  No constipation. Genitourinary: Negative for dysuria. Musculoskeletal: Negative for back pain. Skin: Negative for rash. Neurological: Negative for headaches, focal weakness or numbness. All other ROS negative ____________________________________________   PHYSICAL EXAM:  VITAL SIGNS: ED Triage Vitals  Enc Vitals Group     BP 10/10/20 1622 136/75     Pulse Rate 10/10/20 1618 (!) 42     Resp 10/10/20 1618 17     Temp 10/10/20 1618 98.3 F (36.8 C)     Temp Source 10/10/20 1618 Oral     SpO2 10/10/20 1618 95 %     Weight 10/10/20 1620 121 lb 4.1 oz (55 kg)     Height 10/10/20 1620 5\' 7"  (1.702 m)     Head Circumference --      Peak Flow --      Pain Score 10/10/20 1620 0     Pain Loc --      Pain Edu? --      Excl. in GC? --     Constitutional: Alert and oriented. Well appearing and in no acute distress.  Elderly female Eyes: Conjunctivae are normal. EOMI. right pupil slightly larger than left pupil Head: Atraumatic. Nose: No congestion/rhinnorhea. Mouth/Throat: Mucous membranes are moist.   Neck: No stridor. Trachea Midline. FROM Cardiovascular: Bradycardic, regular rhythm. Grossly normal heart sounds.  Good peripheral circulation. Respiratory: Normal respiratory effort.  No retractions. Lungs CTAB. Gastrointestinal: Soft and nontender. No  distention. No abdominal bruits.  Musculoskeletal: No lower extremity tenderness nor edema.  No joint effusions. Neurologic:  Normal speech and language. No gross focal neurologic deficits are appreciated.  Skin:  Skin is warm, dry and intact. No rash noted. Psychiatric: Mood and affect are normal. Speech and behavior are normal. GU: Deferred   ____________________________________________   LABS (all labs ordered are listed, but only abnormal results are displayed)  Labs Reviewed  RESP PANEL BY RT-PCR (FLU A&B, COVID) ARPGX2  CBC WITH DIFFERENTIAL/PLATELET  COMPREHENSIVE METABOLIC PANEL  TSH  T4, FREE  BETA-HYDROXYBUTYRIC ACID  URINALYSIS, ROUTINE W REFLEX MICROSCOPIC  MAGNESIUM  TROPONIN I (HIGH SENSITIVITY)   ____________________________________________   ED ECG REPORT I, Concha Se, the attending physician, personally viewed and interpreted this ECG.  Sinus bradycardia rate of 42 without any ST elevation or T wave inversions except for V2 and aVL with right bundle branch block ____________________________________________  RADIOLOGY I, Concha Se, personally viewed and evaluated these images (plain radiographs) as part of my medical decision making, as well as reviewing the written report by the radiologist.  ED MD interpretation: no PNA   Official radiology report(s): DG Chest Portable 1 View  Result Date: 10/10/2020 CLINICAL DATA:  Weakness. Bradycardia. Blood sugar 355. Near syncope. EXAM: PORTABLE CHEST 1 VIEW COMPARISON:  None. FINDINGS: The heart and mediastinal contours are unchanged. Aortic calcification. Similar-appearing left base atelectasis versus scarring. No focal consolidation. No pulmonary edema. No pleural effusion. No pneumothorax. No acute osseous abnormality.  Old healed left rib fractures. IMPRESSION: No active disease. Electronically Signed   By: Tish Frederickson M.D.   On: 10/10/2020 17:31     ____________________________________________   PROCEDURES  Procedure(s) performed (including Critical Care):  .1-3 Lead EKG Interpretation Performed by: Concha Se, MD Authorized by: Concha Se, MD     Interpretation: abnormal     ECG rate:  40s   ECG rate assessment: bradycardic     Rhythm: sinus rhythm     Ectopy: none     Conduction: normal     ____________________________________________   INITIAL IMPRESSION / ASSESSMENT AND PLAN / ED COURSE  [facility] was evaluated in [facility] on 10/10/2020 for the symptoms described in the history of present illness. She was evaluated in the context of the global COVID-19 pandemic, which necessitated consideration that the patient might be at risk for infection with the SARS-CoV-2 virus that causes COVID-19. Institutional protocols and algorithms that pertain to the evaluation of patients at risk for COVID-19 are in a state of rapid change based on information released by regulatory bodies including the CDC and federal and state organizations. These policies and algorithms were followed during the patient's care in the ED.    Patient reportedly is coming in for weakness, near syncopal episode, hyperglycemia and low heart rates.  Patient's blood pressure at this time is stable and patient is in a sinus rhythm therefore we will hold off on any atropine.  Labs ordered to evaluate for any electrolyte abnormalities, AKI, UTI.  Patient does have some pupil asymmetry therefore will get CT head to make sure of intercranial hemorrhage.  This could be from prior eye surgery.  Chest x-ray ordered evaluate for any pneumonia and COVID swab.  Will get labs to evaluate for any evidence of DKA and thyroid dysfunction given the bradycardia  Patient will be given 500 cc of fluid for her elevated sugar.  6:44 PM patient's daughter is at bedside and states that she was told that she had 2 syncopal episodes where she passed out into her  food and another one that the daughter witnessed when she was out for a  few seconds.  She does report a little bit of jerking but no history of seizures.  Patient's heart rates were in the 40s but after some fluids the heart rates have gone up to the 70s.  Discussed with them goals of care.  Recommended admission for syncopal work-up, monitoring for bradycardia but obviously given patient's age if they do not want to have anything done then I could discharge patient.  They are open to admission      ____________________________________________   FINAL CLINICAL IMPRESSION(S) / ED DIAGNOSES   Final diagnoses:  Symptomatic bradycardia  Syncope, unspecified syncope type  Hyperglycemia      MEDICATIONS GIVEN DURING THIS VISIT:  Medications  sodium chloride flush (NS) 0.9 % injection 3 mL (has no administration in time range)  enoxaparin (LOVENOX) injection 40 mg (has no administration in time range)  insulin aspart (novoLOG) injection 0-15 Units (has no administration in time range)  insulin aspart (novoLOG) injection 0-5 Units (has no administration in time range)  hydrocortisone cream 1 % (has no administration in time range)  sodium chloride 0.9 % bolus 500 mL (0 mLs Intravenous Stopped 10/10/20 1756)     ED Discharge Orders     None        Note:  This document was prepared using Dragon voice recognition software and may include unintentional dictation errors.    Concha Se, MD 10/10/20 Jerene Bears

## 2020-10-10 NOTE — ED Triage Notes (Signed)
Brady , weakness, blood sugar 355, nea syncopal,

## 2020-10-10 NOTE — ED Notes (Signed)
MRI on the phone with the patients daughter.

## 2020-10-10 NOTE — H&P (Signed)
History and Physical   Amy Calhoun NWG:[id number] DOB: Dec 11, 1928 DOA: 10/10/2020  PCP: Amy Grana, FNP  Outpatient Specialists: Dr. Rob Calhoun, gastroenterology Patient coming from: [facility]   I have personally briefly reviewed patient's old medical records in [facility] EMR.  Chief Concern: Syncope  HPI: Amy Calhoun is a 87 y.o. female with medical history significant for hyperlipidemia, hypertension, insulin-dependent diabetes mellitus, hypothyroid, GERD, who presents to the emergency department for chief concerns of presyncopal episode.  Per daughter over the phone, patient had her first episode of syncope/presyncope during dining room approximately at approximately 12:15PM during lunch.  It was reported that she lost consciousness, for about 4-5 seconds, her head fell on her plate.  There was no abnormal movements noted at this time.  Patient was helped to her room for rest.  The second epsisode, was when patient was laying in bed, per daughter, her body stiffened up, patient started snoring.   She had two episodes of pre-syncope today, 10/10/20.  She has never experienced this before.  Patient states she was aware of the second episode.  Patient states that she was on the bed, she felt herself stiffen up, and like she was floating. She endorses blurry vision that started earlier today.  The blurry vision has now resolved.  She had a 30 pound unintentional weight lost.  Patient states the weight loss was in last 6 months; granddaughter, at bedside, states the 30 pound unintentional weight loss was since the pandemic started which was over 2 years ago.  At bedside, she is able to tell me her name, age, current location of hopsital and granddaughter at bedside, Amy Calhoun.   Social history: She lives at [facility] independent living.  ROS: Constitutional: no weight change, no fever ENT/Mouth: no sore throat, no rhinorrhea Eyes: no eye pain, no  vision changes Cardiovascular: no chest pain, no dyspnea,  no edema, no palpitations Respiratory: no cough, no sputum, no wheezing Gastrointestinal: no nausea, no vomiting, no diarrhea, no constipation Genitourinary: no urinary incontinence, no dysuria, no hematuria Musculoskeletal: no arthralgias, no myalgias Skin: no skin lesions, no pruritus, Neuro: + weakness, + loss of consciousness, + syncope Psych: no anxiety, no depression, + decrease appetite Heme/Lymph: no bruising, no bleeding  ED Course: Discussed with emergency medicine provider, patient requiring hospitalization for chief concerns of syncope.  Labs in the emergency department was remarkable for sodium 135, potassium 4.5, chloride 100, bicarb 27, BUN of 37, serum creatinine of 1.12, nonfasting blood glucose 360, WBC 8.6, hemoglobin 12, platelets 184, GFR 47.  High-sensitivity troponin is 8.  COVID/influenza A/influenza B PCR were negative.  TSH was 3.281.  Beta hydroxybutyrate is 0.14.  Magnesium 1.9.  High sensitive troponin was 8 and 9 respectively  In the emergency department patient was given sodium chloride 1 L bolus and additional 500 mL bolus.  Assessment/Plan  Principal Problem:   Syncope Active Problems:   Esophageal dysmotility   Hyperlipidemia   Hypothyroidism   HTN (hypertension), malignant   CKD (chronic kidney disease) stage 3, GFR 30-59 ml/min (HCC)   Gastroesophageal reflux disease without esophagitis   Fatigue   Depression   Weakness of both lower extremities   Type 2 diabetes mellitus with hyperglycemia, with long-term current use of insulin (HCC)   B12 deficiency   Abnormal gait   Symptomatic bradycardia   # Syncope/near syncope - Differentials include symptomatic bradycardia, sick sinus syndrome, polypharmacy, dehydration, stroke, carcinoma including brain lesions, CVA, TIA, infectious etiology,  seizure, including atypical pneumonia - Acetaminophen-codeine 300-30 mg as needed, gabapentin 200 mg  twice daily have been held at this time - Check MRI, EEG, complete echo, B12, check D-dimer - Check procalcitonin, urine analysis - Lower extremity ultrasound to assess for DVTs ordered given positive D-dimer - A.m. team to consider CTA of the chest to assess for pulmonary embolism if appropriate - I recommend on discharge patient be discharged home with a Holter monitor/Zio patch - Fall precautions - MedSurg, observation, telemetry for 3 days ordered - A.m. team to consider neurology and/or cardiology consult as appropriate  # Possible polypharmacy-holding Tylenol 3, gabapentin at this time # History of hypertension-Home medications includes lisinopril 2.5 mg daily # GERD-PPI # Neuropathy-gabapentin 200 mg p.o. twice daily # Anxiety/depression-resumed home amitriptyline 75 mg daily nightly # Rash-hydrocortisone cream 1%, topical, twice daily # Insulin-dependent diabetes mellitus-insulin SSI with at bedtime coverage ordered # Hypothyroid-levothyroxine 150 mcg daily resumed, TSH 3.281, T4 1.06 # History of esophageal dysmotility-continue follow-up with gastroenterology outpatient # History of B12 deficiency-patient gets B12 subcutaneous injections given by her daughter  Chart reviewed.   DVT prophylaxis: Enoxaparin 40 mg subcutaneous Code Status: full code   Diet: Heart healthy Family Communication: discussed with daughter and granddaughter Disposition Plan: Pending clinical course Consults called: not at this  Admission status: MedSurg, no telemetry, observation at this time  Past Medical History:  Diagnosis Date   Allergy    Ambulates with cane    Anemia    At high risk for falls    Cataract    removed both eyes   Chronic kidney disease 08/07/2016   Chronic renal failure, stage III   Depression    Esophageal dysphagia    Gastric ulcer    GERD (gastroesophageal reflux disease)    Hyperlipidemia    Hypertension    Hypothyroidism    IDDM (insulin dependent diabetes  mellitus)    Ischemic colitis (HCC)    Neuromuscular disorder (HCC)    neuropathy   Osteoarthritis    Ulcer of esophagus    Past Surgical History:  Procedure Laterality Date   ABDOMINAL HYSTERECTOMY     CATARACT EXTRACTION, BILATERAL     CHOLECYSTECTOMY     COLONOSCOPY  2010   ESOPHAGEAL DILATION  08/07/2016   usually a couple times a year, last time 07/30/16   GALLBLADDER SURGERY     LUMBAR LAMINECTOMY/DECOMPRESSION MICRODISCECTOMY N/A 08/10/2016   Procedure: BILATERAL HEMILAMINECTOMY LUMBAR THREE-FOUR,LUMBAR FOUR-FIVE,LEFT LUMBAR FIVE-SACRAL ONE HEMILAMINECTOMY AND DECOMPRESSION;  Surgeon: Coletta Memos, MD;  Location: [facility] OR;  Service: Neurosurgery;  Laterality: N/A;   THYROIDECTOMY     UPPER GASTROINTESTINAL ENDOSCOPY     Social History:  reports that she has never smoked. She has never used smokeless tobacco. She reports that she does not drink alcohol and does not use drugs.  Allergies  Allergen Reactions   Pravachol [Pravastatin] Other (See Comments)    States she refused due to side effects   Family History  Problem Relation Age of Onset   Diabetes Mother    Arthritis Mother    Hyperlipidemia Mother    Mental illness Mother    Heart disease Father    Mental illness Sister    Arthritis Maternal Grandmother    Arthritis Maternal Grandfather    Colon cancer Neg Hx    Stomach cancer Neg Hx    Esophageal cancer Neg Hx    Rectal cancer Neg Hx    Colon polyps Neg Hx    Family history:  Family history reviewed and not pertinent  Prior to Admission medications   Medication Sig Start Date End Date Taking? Authorizing Provider  Acetaminophen-Codeine 300-30 MG tablet TAKE 1 TABLET BY MOUTH EVERY 12 (TWELVE) HOURS AS NEEDED FOR PAIN (MODERATE TO SEVERE PAIN). 09/27/20  Yes Dale Manning, MD  amitriptyline (ELAVIL) 75 MG tablet TAKE 2 TABLETS AT BEDTIME 03/11/20  Yes Amy Grana, FNP  ascorbic acid (VITAMIN C) 500 MG tablet Take 500 mg by mouth daily.   Yes [provider]  Cholecalciferol (VITAMIN D) 2000 units tablet Take 2,000 Units by mouth daily.    Yes [provider]  ezetimibe (ZETIA) 10 MG tablet Take 1 tablet (10 mg total) by mouth daily. 06/29/20  Yes Arnett, Lyn Records, FNP  gabapentin (NEURONTIN) 100 MG capsule Take 2 capsules (200 mg total) by mouth 2 (two) times daily. 04/22/20  Yes Arnett, Lyn Records, FNP  Insulin Glargine (LANTUS SOLOSTAR) 100 UNIT/ML Solostar Pen Inject 8 Units into the skin at bedtime. 01/01/19  Yes Shamleffer, Konrad Dolores, MD  insulin lispro (HUMALOG KWIKPEN) 100 UNIT/ML KwikPen Inject 8 Units into the skin 3 (three) times daily. Max Daily 30 units 09/21/20  Yes Shamleffer, Konrad Dolores, MD  Iron, Ferrous Sulfate, 325 (65 Fe) MG TABS Take 325 mg by mouth every other day. Patient taking differently: Take 325 mg by mouth every other day. Takes daily 05/26/20  Yes McLean-Scocuzza, Pasty Spillers, MD  levothyroxine (SYNTHROID) 150 MCG tablet TAKE 1 TABLET DAILY 01/28/20  Yes Amy Grana, FNP  omeprazole (PRILOSEC) 40 MG capsule TAKE 1 CAPSULE TWICE A DAY 07/25/20  Yes Rachael Fee, MD  pyridOXINE (VITAMIN B-6) 100 MG tablet Take 100 mg by mouth daily.   Yes [provider]  albuterol (PROVENTIL HFA;VENTOLIN HFA) 108 (90 Base) MCG/ACT inhaler Inhale 2 puffs into the lungs every 6 (six) hours as needed for wheezing or shortness of breath. 01/07/17   Amy Grana, FNP  Continuous Blood Gluc Sensor (FREESTYLE LIBRE 14 DAY SENSOR) MISC 1 Package by Does not apply route every 14 (fourteen) days. 09/03/19   Shamleffer, Konrad Dolores, MD  CRANBERRY PO Take 1 tablet by mouth every Monday, Wednesday, and Friday. Patient not taking: No sig reported    [provider]  cyanocobalamin (,VITAMIN B-12,) 1000 MCG/ML injection INJECT WEEKLY FOR 4 WEEKS THEN INJECT MONTHLY 07/13/20   Amy Grana, FNP  docusate sodium (COLACE) 100 MG capsule Take 1 capsule (100 mg total) by mouth 2 (two)  times daily. 03/31/16   Katharina Caper, MD  doxycycline (VIBRA-TABS) 100 MG tablet Take 1 tablet (100 mg total) by mouth 2 (two) times daily. Patient not taking: No sig reported 07/12/20   Candelaria Stagers, DPM  glucagon (GLUCAGON EMERGENCY) 1 MG injection Inject 1 mg into the vein once as needed. For hypoglycemic episodes. E11.42 Patient not taking: No sig reported 03/13/16   Tommie Sams, DO  Insulin Pen Needle 32G X 4 MM MISC 1 Device by Does not apply route in the morning, at noon, in the evening, and at bedtime. 11/13/19   Shamleffer, Konrad Dolores, MD  lisinopril (ZESTRIL) 2.5 MG tablet Take 1 tablet (2.5 mg total) by mouth daily. Take for blood pressure greater than 140/90 Patient not taking: No sig reported 07/14/19   Amy Grana, FNP  Misc. Devices (BARIATRIC ROLLATOR) MISC Use as needed 07/31/19   Amy Grana, FNP  ondansetron (ZOFRAN ODT) 4 MG disintegrating tablet Take 1 tablet (  4 mg total) by mouth every 8 (eight) hours as needed for nausea or vomiting. 08/21/18   Tracey Harries, FNP  riboflavin (VITAMIN B-2) 100 MG TABS tablet Take 100 mg by mouth daily. Patient not taking: No sig reported    [provider]  Turmeric 500 MG TABS Take 750 mg by mouth 2 (two) times daily. Patient not taking: No sig reported    [provider]   Physical Exam: Vitals:   10/10/20 1735 10/10/20 1817 10/10/20 1830 10/10/20 1900  BP: (!) 178/70 (!) 165/74 (!) 186/85 (!) 191/82  Pulse: 78 73 73 75  Resp: 16 20 13 14   Temp:  98.8 F (37.1 C)    TempSrc:  Oral    SpO2: 97% 98% 97% 96%  Weight:      Height:       Constitutional: appears younger than chronological age, appropriately frail, given patient's age, NAD, calm, comfortable Eyes: PERRL, lids and conjunctivae normal ENMT: Mucous membranes are moist. Posterior pharynx clear of any exudate or lesions. Age-appropriate dentition. Hearing appropriate Neck: normal, supple, no masses, no thyromegaly Respiratory: clear to  auscultation bilaterally, no wheezing, no crackles. Normal respiratory effort. No accessory muscle use.  Cardiovascular: Regular rate and rhythm, no murmurs / rubs / gallops. No extremity edema. 2+ pedal pulses. No carotid bruits.  Abdomen: no tenderness, no masses palpated, no hepatosplenomegaly. Bowel sounds positive.  Musculoskeletal: no clubbing / cyanosis. No joint deformity upper and lower extremities. Good ROM, no contractures, no atrophy. Normal muscle tone.  Skin: no rashes, lesions, ulcers. No induration Neurologic: Sensation intact. Strength 5/5 in all 4.  Psychiatric: Normal judgment and insight. Alert and oriented x 3. Normal mood.   EKG: independently reviewed, showing sinus bradycardia with rate of 42, QTc 414, right bundle branch block  Chest x-ray on Admission: I personally reviewed and I agree with radiologist reading as below.  DG Chest Portable 1 View  Result Date: 10/10/2020 CLINICAL DATA:  Weakness. Bradycardia. Blood sugar 355. Near syncope. EXAM: PORTABLE CHEST 1 VIEW COMPARISON:  None. FINDINGS: The heart and mediastinal contours are unchanged. Aortic calcification. Similar-appearing left base atelectasis versus scarring. No focal consolidation. No pulmonary edema. No pleural effusion. No pneumothorax. No acute osseous abnormality.  Old healed left rib fractures. IMPRESSION: No active disease. Electronically Signed   By: 10/12/2020 M.D.   On: 10/10/2020 17:31    Labs on Admission: I have personally reviewed following labs  CBC: Recent Labs  Lab 10/10/20 1624  WBC 8.6  NEUTROABS 4.9  HGB 12.0  HCT 36.4  MCV 92.4  PLT 184   Basic Metabolic Panel: Recent Labs  Lab 10/10/20 1624  NA 135  K 4.5  CL 100  CO2 27  GLUCOSE 360*  BUN 37*  CREATININE 1.12*  CALCIUM 8.5*  MG 1.9   GFR: Estimated Creatinine Clearance: 29 mL/min (A) (by C-G formula based on SCr of 1.12 mg/dL (H)).  Liver Function Tests: Recent Labs  Lab 10/10/20 1624  AST 21  ALT 17   ALKPHOS 65  BILITOT 0.5  PROT 6.9  ALBUMIN 3.9   CBG: Recent Labs  Lab 10/10/20 1855  GLUCAP 248*   Thyroid Function Tests: Recent Labs    10/10/20 1624  TSH 3.281  FREET4 1.06   Urine analysis:    Component Value Date/Time   COLORURINE YELLOW 10/14/2019 1426   APPEARANCEUR Sl Cloudy (A) 10/14/2019 1426   LABSPEC >=1.030 (A) 10/14/2019 1426   PHURINE 5.5 10/14/2019 1426  GLUCOSEU NEGATIVE 10/14/2019 1426   HGBUR NEGATIVE 10/14/2019 1426   BILIRUBINUR NEGATIVE 10/14/2019 1426   BILIRUBINUR neg 09/04/2018 0955   KETONESUR NEGATIVE 10/14/2019 1426   PROTEINUR 30 (A) 01/28/2019 2056   UROBILINOGEN 0.2 10/14/2019 1426   NITRITE NEGATIVE 10/14/2019 1426   LEUKOCYTESUR MODERATE (A) 10/14/2019 1426   Dr. Sedalia Muta [facility]  If 7PM-7AM, please contact overnight-coverage provider If 7AM-7PM, please contact day coverage provider [email or web address]  10/10/2020, 7:14 PM

## 2020-10-11 ENCOUNTER — Observation Stay: Payer: Medicare HMO

## 2020-10-11 ENCOUNTER — Encounter
Admission: EM | Disposition: A | Discharge status: Home Health | Payer: Self-pay | Source: Home / Self Care | Attending: Internal Medicine

## 2020-10-11 ENCOUNTER — Observation Stay (HOSPITAL_BASED_OUTPATIENT_CLINIC_OR_DEPARTMENT_OTHER)
Admit: 2020-10-11 | Discharge: 2020-10-11 | Disposition: A | Discharge status: Home / Self Care | Payer: Medicare HMO | Attending: Internal Medicine | Admitting: Internal Medicine

## 2020-10-11 DIAGNOSIS — R634 Abnormal weight loss: Secondary | ICD-10-CM

## 2020-10-11 DIAGNOSIS — R079 Chest pain, unspecified: Secondary | ICD-10-CM | POA: Diagnosis not present

## 2020-10-11 DIAGNOSIS — R55 Syncope and collapse: Secondary | ICD-10-CM

## 2020-10-11 DIAGNOSIS — I2510 Atherosclerotic heart disease of native coronary artery without angina pectoris: Secondary | ICD-10-CM | POA: Diagnosis not present

## 2020-10-11 DIAGNOSIS — I442 Atrioventricular block, complete: Principal | ICD-10-CM

## 2020-10-11 HISTORY — PX: LEFT HEART CATH AND CORONARY ANGIOGRAPHY: CATH118249

## 2020-10-11 HISTORY — PX: TEMPORARY PACEMAKER: CATH118268

## 2020-10-11 LAB — BASIC METABOLIC PANEL
Anion gap: 10 (ref 5–15)
BUN: 26 mg/dL — ABNORMAL HIGH (ref 8–23)
CO2: 27 mmol/L (ref 22–32)
Calcium: 8.4 mg/dL — ABNORMAL LOW (ref 8.9–10.3)
Chloride: 100 mmol/L (ref 98–111)
Creatinine, Ser: 0.74 mg/dL (ref 0.44–1.00)
GFR, Estimated: >60 mL/min (ref >60)
Glucose, Bld: 226 mg/dL — ABNORMAL HIGH (ref 70–99)
Potassium: 4.2 mmol/L (ref 3.5–5.1)
Sodium: 137 mmol/L (ref 135–145)

## 2020-10-11 LAB — ECHOCARDIOGRAM COMPLETE
AR max vel: 1.68 cm2
AV Area VTI: 2.02 cm2
AV Area mean vel: 1.58 cm2
AV Mean grad: 4.5 mmHg
AV Peak grad: 7.8 mmHg
Ao pk vel: 1.4 m/s
Area-P 1/2: 2.26 cm2
Height: 67 in
MV VTI: 1.53 cm2
S' Lateral: 2.1 cm
Weight: 1940.05 oz

## 2020-10-11 LAB — CBC
HCT: 35.2 % — ABNORMAL LOW (ref 36.0–46.0)
Hemoglobin: 12.1 g/dL (ref 12.0–15.0)
MCH: 31.4 pg (ref 26.0–34.0)
MCHC: 34.4 g/dL (ref 30.0–36.0)
MCV: 91.4 fL (ref 80.0–100.0)
Platelets: 163 10*3/uL (ref 150–400)
RBC: 3.85 MIL/uL — ABNORMAL LOW (ref 3.87–5.11)
RDW: 13.1 % (ref 11.5–15.5)
WBC: 8.1 10*3/uL (ref 4.0–10.5)
nRBC: 0 % (ref 0.0–0.2)

## 2020-10-11 LAB — GLUCOSE, CAPILLARY
Glucose-Capillary: 263 mg/dL — ABNORMAL HIGH (ref 70–99)
Glucose-Capillary: 326 mg/dL — ABNORMAL HIGH (ref 70–99)

## 2020-10-11 LAB — CBG MONITORING, ED
Glucose-Capillary: 208 mg/dL — ABNORMAL HIGH (ref 70–99)
Glucose-Capillary: 216 mg/dL — ABNORMAL HIGH (ref 70–99)
Glucose-Capillary: 264 mg/dL — ABNORMAL HIGH (ref 70–99)

## 2020-10-11 LAB — MRSA NEXT GEN BY PCR, NASAL: MRSA by PCR Next Gen: NOT DETECTED

## 2020-10-11 SURGERY — LEFT HEART CATH AND CORONARY ANGIOGRAPHY
Anesthesia: Moderate Sedation

## 2020-10-11 MED ORDER — LIDOCAINE HCL (PF) 1 % IJ SOLN
INTRAMUSCULAR | Status: DC | PRN
  Administered 2020-10-11 (×2): 2 mL

## 2020-10-11 MED ORDER — GLUCERNA SHAKE PO LIQD
237.0000 mL | Freq: Three times a day (TID) | ORAL | Status: DC
Start: 2020-10-11 — End: 2020-10-17
  Administered 2020-10-11 – 2020-10-17 (×12): 237 mL via ORAL

## 2020-10-11 MED ORDER — SODIUM CHLORIDE 0.9% FLUSH
3.0000 mL | INTRAVENOUS | Status: DC | PRN
  Administered 2020-10-14: 22:00:00 3 mL via INTRAVENOUS

## 2020-10-11 MED ORDER — VERAPAMIL HCL 2.5 MG/ML IV SOLN
INTRAVENOUS | Status: DC | PRN
  Administered 2020-10-11: 2.5 mg via INTRA_ARTERIAL

## 2020-10-11 MED ORDER — SODIUM CHLORIDE 0.9% FLUSH
3.0000 mL | Freq: Two times a day (BID) | INTRAVENOUS | Status: DC
  Administered 2020-10-11 – 2020-10-17 (×9): 3 mL via INTRAVENOUS

## 2020-10-11 MED ORDER — SODIUM CHLORIDE 0.9% FLUSH: 3.0000 mL | INTRAVENOUS | Status: DC | PRN

## 2020-10-11 MED ORDER — ACETAMINOPHEN-CODEINE #3 300-30 MG PO TABS
1.0000 | ORAL_TABLET | Freq: Two times a day (BID) | ORAL | Status: DC | PRN
Start: 2020-10-11 — End: 2020-10-17
  Administered 2020-10-11 – 2020-10-17 (×3): 1 via ORAL
  Filled 2020-10-11 (×4): qty 1

## 2020-10-11 MED ORDER — SODIUM CHLORIDE 0.9 % IV SOLN: 250.0000 mL | INTRAVENOUS | Status: DC | PRN

## 2020-10-11 MED ORDER — IOHEXOL 350 MG/ML SOLN
75.0000 mL | Freq: Once | INTRAVENOUS | Status: AC | PRN
  Administered 2020-10-11: 75 mL via INTRAVENOUS

## 2020-10-11 MED ORDER — FENTANYL CITRATE (PF) 100 MCG/2ML IJ SOLN
INTRAMUSCULAR | Status: AC
  Filled 2020-10-11: qty 2

## 2020-10-11 MED ORDER — HYDRALAZINE HCL 20 MG/ML IJ SOLN: 10.0000 mg | INTRAMUSCULAR | Status: AC | PRN

## 2020-10-11 MED ORDER — SODIUM CHLORIDE 0.9% FLUSH: 3.0000 mL | Freq: Two times a day (BID) | INTRAVENOUS | Status: DC

## 2020-10-11 MED ORDER — HEPARIN SODIUM (PORCINE) 1000 UNIT/ML IJ SOLN
INTRAMUSCULAR | Status: DC | PRN
  Administered 2020-10-11: 2500 [IU] via INTRAVENOUS

## 2020-10-11 MED ORDER — ACETAMINOPHEN 325 MG PO TABS
650.0000 mg | ORAL_TABLET | ORAL | Status: DC | PRN
  Administered 2020-10-13 – 2020-10-17 (×7): 650 mg via ORAL
  Filled 2020-10-11 (×7): qty 2

## 2020-10-11 MED ORDER — IOHEXOL 350 MG/ML SOLN
INTRAVENOUS | Status: DC | PRN
  Administered 2020-10-11: 25 mL

## 2020-10-11 MED ORDER — HEPARIN (PORCINE) IN NACL 1000-0.9 UT/500ML-% IV SOLN
INTRAVENOUS | Status: DC | PRN
  Administered 2020-10-11 (×2): 500 mL

## 2020-10-11 MED ORDER — MIDAZOLAM HCL 2 MG/2ML IJ SOLN
INTRAMUSCULAR | Status: AC
  Filled 2020-10-11: qty 2

## 2020-10-11 MED ORDER — HEPARIN SODIUM (PORCINE) 1000 UNIT/ML IJ SOLN
INTRAMUSCULAR | Status: AC
  Filled 2020-10-11: qty 1

## 2020-10-11 MED ORDER — CHLORHEXIDINE GLUCONATE CLOTH 2 % EX PADS
6.0000 | MEDICATED_PAD | Freq: Every day | CUTANEOUS | Status: DC
  Administered 2020-10-11 – 2020-10-13 (×3): 6 via TOPICAL

## 2020-10-11 MED ORDER — GABAPENTIN 100 MG PO CAPS
200.0000 mg | ORAL_CAPSULE | Freq: Two times a day (BID) | ORAL | Status: DC
  Administered 2020-10-11 – 2020-10-17 (×11): 200 mg via ORAL
  Filled 2020-10-11 (×12): qty 2

## 2020-10-11 MED ORDER — SODIUM CHLORIDE 0.9 % IV BOLUS
500.0000 mL | Freq: Once | INTRAVENOUS | Status: AC
  Administered 2020-10-11: 500 mL via INTRAVENOUS

## 2020-10-11 MED ORDER — ONDANSETRON HCL 4 MG/2ML IJ SOLN
4.0000 mg | Freq: Four times a day (QID) | INTRAMUSCULAR | Status: DC | PRN
  Administered 2020-10-12: 4 mg via INTRAVENOUS
  Filled 2020-10-11: qty 2

## 2020-10-11 MED ORDER — SODIUM CHLORIDE 0.9 % IV SOLN: INTRAVENOUS | Status: AC

## 2020-10-11 MED ORDER — SODIUM CHLORIDE 0.9 % IV SOLN: INTRAVENOUS | Status: DC

## 2020-10-11 MED ORDER — VERAPAMIL HCL 2.5 MG/ML IV SOLN
INTRAVENOUS | Status: AC
  Filled 2020-10-11: qty 2

## 2020-10-11 SURGICAL SUPPLY — 17 items
CABLE ADAPT PACING TEMP 12FT (ADAPTER) ×1 IMPLANT
CANNULA 5F STIFF (CANNULA) ×1 IMPLANT
CATH INFINITI 5 FR JL3.5 (CATHETERS) ×1 IMPLANT
CATH INFINITI JR4 5F (CATHETERS) ×1 IMPLANT
CATH S G BIP PACING (CATHETERS) ×1 IMPLANT
DEVICE RAD TR BAND REGULAR (VASCULAR PRODUCTS) ×1 IMPLANT
DRAPE BRACHIAL (DRAPES) ×2 IMPLANT
GLIDESHEATH SLEND SS 6F .021 (SHEATH) ×1 IMPLANT
GUIDEWIRE INQWIRE 1.5J.035X260 (WIRE) IMPLANT
INQWIRE 1.5J .035X260CM (WIRE) ×2
KIT SYRINGE INJ CVI SPIKEX1 (MISCELLANEOUS) ×1 IMPLANT
PACK CARDIAC CATH (CUSTOM PROCEDURE TRAY) ×2 IMPLANT
PROTECTION STATION PRESSURIZED (MISCELLANEOUS) ×2
SET ATX SIMPLICITY (MISCELLANEOUS) ×1 IMPLANT
SHEATH AVANTI 6FR X 11CM (SHEATH) ×1 IMPLANT
SLEEVE REPOSITIONING LENGTH 30 (MISCELLANEOUS) ×1 IMPLANT
STATION PROTECTION PRESSURIZED (MISCELLANEOUS) IMPLANT

## 2020-10-11 NOTE — ED Notes (Signed)
Patient off the floor for testing

## 2020-10-11 NOTE — H&P (View-Only) (Signed)
Cardiology Consultation:   Patient ID: Amy Calhoun; [id number]; December 10, 1928   Admit date: 10/10/2020 Date of Consult: 10/11/2020  Primary Care Provider: Allegra Grana, FNP Primary Cardiologist: New to [facility] - consult by End Primary Electrophysiologist:  None   Patient Profile:   Amy Calhoun is a 87 y.o. female with a hx of insulin-dependent diabetes mellitus with peripheral neuropathy, COVID in 01/2018, unintentional 30 pound weight loss throughout the COVID pandemic, HTN, orthostatic hypotension, HLD, hypothyroidism, and GERD who is being seen today for the evaluation of presyncope at the request of Dr. Georgeann Oppenheim.  History of Present Illness:   Amy Calhoun has no previously known cardiac history.  Patient's daughter indicates the patient has previously had to stop lisinopril secondary to low BP with associated orthostasis.  Patient resides at a retirement facility and lives independently.  She was eating lunch on 9/26 when she suffered sudden onset of LOC that was witnessed by facility staff.  They have indicated that this episode lasted approximately 3 to 5 seconds.  Upon coming to, they checked her blood sugar and found it to be in the 200s.  She had stable BP.  They reported to family that the patient's heart rate was in the 40s bpm.  Patient was back to baseline shortly thereafter and actually drove her scooter back to her room, and was assisted by staff.  Later that afternoon, while the patient's daughter was present, the patient was laying in her bed when she developed a "stiffness" with associated blurry vision.  The patient's daughter indicated this episode lasted again for approximate 3 to 5 seconds.  Following this, the patient did note some mild chest discomfort.  No associated diaphoresis or dyspnea.  Since these episodes, the patient has felt like her vision has been cloudy.  Otherwise, she is without complaint.  Upon arrival to [facility], twelve-lead EKG showed 2-1 AV block  as outlined below.  Chest x-ray showed no active disease.  CT head was nonacute.  MRI brain showed no acute pathology with chronic microvascular age-related changes.  High-sensitivity troponin negative x2.  D-dimer mildly elevated at 0.7.  TSH and free T4 normal.  Potassium 4.5.  Glucose 360.  BUN/SCR 37/1.12 which appears to be her approximate baseline.  Hgb 12.0.  BP has been stable.  Telemetry has demonstrated sinus rhythm with intermittent Mobitz type II AV block with rates ranging from the 70s to 40s bpm.  She is not on any beta-blockers or calcium channel blockers.  In the ED, she received 2 separate 500 cc normal saline boluses.  Currently, she is without complaint.    Past Medical History:  Diagnosis Date   Allergy    Ambulates with cane    Anemia    At high risk for falls    Cataract    removed both eyes   Chronic kidney disease 08/07/2016   Chronic renal failure, stage III   Depression    Esophageal dysphagia    Gastric ulcer    GERD (gastroesophageal reflux disease)    Hyperlipidemia    Hypertension    Hypothyroidism    IDDM (insulin dependent diabetes mellitus)    Ischemic colitis (HCC)    Neuromuscular disorder (HCC)    neuropathy   Osteoarthritis    Ulcer of esophagus     Past Surgical History:  Procedure Laterality Date   ABDOMINAL HYSTERECTOMY     CATARACT EXTRACTION, BILATERAL     CHOLECYSTECTOMY     COLONOSCOPY  2010   ESOPHAGEAL DILATION  08/07/2016   usually a couple times a year, last time 07/30/16   GALLBLADDER SURGERY     LUMBAR LAMINECTOMY/DECOMPRESSION MICRODISCECTOMY N/A 08/10/2016   Procedure: BILATERAL HEMILAMINECTOMY LUMBAR THREE-FOUR,LUMBAR FOUR-FIVE,LEFT LUMBAR FIVE-SACRAL ONE HEMILAMINECTOMY AND DECOMPRESSION;  Surgeon: Coletta Memos, MD;  Location: [facility] OR;  Service: Neurosurgery;  Laterality: N/A;   THYROIDECTOMY     UPPER GASTROINTESTINAL ENDOSCOPY       Home Meds: Prior to Admission medications   Medication Sig Start Date End Date Taking?  Authorizing Provider  Acetaminophen-Codeine 300-30 MG tablet TAKE 1 TABLET BY MOUTH EVERY 12 (TWELVE) HOURS AS NEEDED FOR PAIN (MODERATE TO SEVERE PAIN). 09/27/20  Yes Dale , MD  amitriptyline (ELAVIL) 75 MG tablet TAKE 2 TABLETS AT BEDTIME 03/11/20  Yes Allegra Grana, FNP  ascorbic acid (VITAMIN C) 500 MG tablet Take 500 mg by mouth daily.   Yes [provider]  Cholecalciferol (VITAMIN D) 2000 units tablet Take 2,000 Units by mouth daily.    Yes [provider]  ezetimibe (ZETIA) 10 MG tablet Take 1 tablet (10 mg total) by mouth daily. 06/29/20  Yes Arnett, Lyn Records, FNP  gabapentin (NEURONTIN) 100 MG capsule Take 2 capsules (200 mg total) by mouth 2 (two) times daily. 04/22/20  Yes Arnett, Lyn Records, FNP  Insulin Glargine (LANTUS SOLOSTAR) 100 UNIT/ML Solostar Pen Inject 8 Units into the skin at bedtime. 01/01/19  Yes Shamleffer, Konrad Dolores, MD  insulin lispro (HUMALOG KWIKPEN) 100 UNIT/ML KwikPen Inject 8 Units into the skin 3 (three) times daily. Max Daily 30 units 09/21/20  Yes Shamleffer, Konrad Dolores, MD  Iron, Ferrous Sulfate, 325 (65 Fe) MG TABS Take 325 mg by mouth every other day. Patient taking differently: Take 325 mg by mouth every other day. Takes daily 05/26/20  Yes McLean-Scocuzza, Pasty Spillers, MD  levothyroxine (SYNTHROID) 150 MCG tablet TAKE 1 TABLET DAILY 01/28/20  Yes Allegra Grana, FNP  omeprazole (PRILOSEC) 40 MG capsule TAKE 1 CAPSULE TWICE A DAY 07/25/20  Yes Rachael Fee, MD  pyridOXINE (VITAMIN B-6) 100 MG tablet Take 100 mg by mouth daily.   Yes [provider]  albuterol (PROVENTIL HFA;VENTOLIN HFA) 108 (90 Base) MCG/ACT inhaler Inhale 2 puffs into the lungs every 6 (six) hours as needed for wheezing or shortness of breath. 01/07/17   Allegra Grana, FNP  Continuous Blood Gluc Sensor (FREESTYLE LIBRE 14 DAY SENSOR) MISC 1 Package by Does not apply route every 14 (fourteen) days. 09/03/19   Shamleffer, Konrad Dolores, MD   CRANBERRY PO Take 1 tablet by mouth every Monday, Wednesday, and Friday. Patient not taking: No sig reported    [provider]  cyanocobalamin (,VITAMIN B-12,) 1000 MCG/ML injection INJECT WEEKLY FOR 4 WEEKS THEN INJECT MONTHLY 07/13/20   Allegra Grana, FNP  docusate sodium (COLACE) 100 MG capsule Take 1 capsule (100 mg total) by mouth 2 (two) times daily. 03/31/16   Katharina Caper, MD  glucagon (GLUCAGON EMERGENCY) 1 MG injection Inject 1 mg into the vein once as needed. For hypoglycemic episodes. E11.42 Patient not taking: No sig reported 03/13/16   Tommie Sams, DO  Insulin Pen Needle 32G X 4 MM MISC 1 Device by Does not apply route in the morning, at noon, in the evening, and at bedtime. 11/13/19   Shamleffer, Konrad Dolores, MD  lisinopril (ZESTRIL) 2.5 MG tablet Take 1 tablet (2.5 mg total) by mouth daily. Take for blood pressure greater than 140/90  Patient not taking: No sig reported 07/14/19   Allegra Grana, FNP  Misc. Devices (BARIATRIC ROLLATOR) MISC Use as needed 07/31/19   Allegra Grana, FNP  ondansetron (ZOFRAN ODT) 4 MG disintegrating tablet Take 1 tablet (4 mg total) by mouth every 8 (eight) hours as needed for nausea or vomiting. 08/21/18   Tracey Harries, FNP  riboflavin (VITAMIN B-2) 100 MG TABS tablet Take 100 mg by mouth daily. Patient not taking: No sig reported    [provider]  Turmeric 500 MG TABS Take 750 mg by mouth 2 (two) times daily. Patient not taking: No sig reported    [provider]    Inpatient Medications: Scheduled Meds:  amitriptyline  150 mg Oral QHS   docusate sodium  100 mg Oral BID   enoxaparin (LOVENOX) injection  30 mg Subcutaneous Q24H   ezetimibe  10 mg Oral Daily   ferrous sulfate  325 mg Oral Daily   hydrocerin   Topical BID   insulin aspart  0-15 Units Subcutaneous TID WC   insulin aspart  0-5 Units Subcutaneous QHS   insulin glargine-yfgn  8 Units Subcutaneous QHS   levothyroxine  150 mcg  Oral Daily   lisinopril  2.5 mg Oral Daily   pantoprazole  80 mg Oral Daily   pyridOXINE  100 mg Oral Daily   sodium chloride flush  3 mL Intravenous Q12H   Continuous Infusions:  PRN Meds: albuterol, hydrALAZINE  Allergies:   Allergies  Allergen Reactions   Pravachol [Pravastatin] Other (See Comments)    States she refused due to side effects    Social History:   Social History   Socioeconomic History   Marital status: Widowed    Spouse name: Not on file   Number of children: Not on file   Years of education: Not on file   Highest education level: Not on file  Occupational History   Occupation: retired  Tobacco Use   Smoking status: Never   Smokeless tobacco: Never  Vaping Use   Vaping Use: Never used  Substance and Sexual Activity   Alcohol use: No   Drug use: No   Sexual activity: Not Currently    Partners: Male  Other Topics Concern   Not on file  Social History Narrative   [facility] senior complex, independent living.    Provide her with cleaning, one meal per day.    Social Determinants of Health   Financial Resource Strain: Low Risk    Difficulty of Paying Living Expenses: Not hard at all  Food Insecurity: No Food Insecurity   Worried About Programme researcher, broadcasting/film/video in the Last Year: Never true   Ran Out of Food in the Last Year: Never true  Transportation Needs: No Transportation Needs   Lack of Transportation (Medical): No   Lack of Transportation (Non-Medical): No  Physical Activity: Sufficiently Active   Days of Exercise per Week: 3 days   Minutes of Exercise per Session: 50 min  Stress: No Stress Concern Present   Feeling of Stress : Not at all  Social Connections: Unknown   Frequency of Communication with Friends and Family: Not on file   Frequency of Social Gatherings with Friends and Family: More than three times a week   Attends Religious Services: Not on file   Active Member of Clubs or Organizations: Not on file   Attends Tax inspector Meetings: Not on file   Marital Status: Not on file  Intimate  Partner Violence: Not At Risk   Fear of Current or Ex-Partner: No   Emotionally Abused: No   Physically Abused: No   Sexually Abused: No     Family History:   Family History  Problem Relation Age of Onset   Diabetes Mother    Arthritis Mother    Hyperlipidemia Mother    Mental illness Mother    Heart disease Father    Mental illness Sister    Arthritis Maternal Grandmother    Arthritis Maternal Grandfather    Colon cancer Neg Hx    Stomach cancer Neg Hx    Esophageal cancer Neg Hx    Rectal cancer Neg Hx    Colon polyps Neg Hx     ROS:  Review of Systems  Constitutional:  Positive for malaise/fatigue and weight loss. Negative for chills, diaphoresis and fever.  HENT:  Negative for congestion.   Eyes:  Positive for blurred vision. Negative for discharge and redness.  Respiratory:  Negative for cough, sputum production, shortness of breath and wheezing.   Cardiovascular:  Positive for chest pain. Negative for palpitations, orthopnea, claudication, leg swelling and PND.  Gastrointestinal:  Negative for abdominal pain, heartburn, nausea and vomiting.  Musculoskeletal:  Negative for falls and myalgias.  Skin:  Negative for rash.  Neurological:  Positive for loss of consciousness and weakness. Negative for dizziness, tingling, tremors, sensory change, speech change and focal weakness.  Endo/Heme/Allergies:  Does not bruise/bleed easily.  Psychiatric/Behavioral:  Negative for substance abuse. The patient is not nervous/anxious.   All other systems reviewed and are negative.    Physical Exam/Data:   Vitals:   10/11/20 0700 10/11/20 0800 10/11/20 1107 10/11/20 1221  BP: (!) 141/65 (!) 136/55 (!) 146/61 (!) 134/48  Pulse: 68 66 70 70  Resp: 15 14 14 15   Temp:      TempSrc:      SpO2: 96% 96% 96% 93%  Weight:      Height:        Intake/Output Summary (Last 24 hours) at 10/11/2020 1256 Last data  filed at 10/11/2020 0020 Gross per 24 hour  Intake 500 ml  Output 600 ml  Net -100 ml   Filed Weights   10/10/20 1620  Weight: 55 kg   Body mass index is 18.99 kg/m.   Physical Exam: General: Elderly appearing and hard of hearing, in no acute distress. Head: Normocephalic, atraumatic, sclera non-icteric, no xanthomas, nares without discharge.  Neck: Negative for carotid bruits. JVD not elevated. Lungs: Clear bilaterally to auscultation without wheezes, rales, or rhonchi. Breathing is unlabored. Heart: RRR with S1 S2. I/VI systolic murmur LSB, no rubs, or gallops appreciated. Abdomen: Soft, non-tender, non-distended with normoactive bowel sounds. No hepatomegaly. No rebound/guarding. No obvious abdominal masses. Msk:  Strength and tone appear normal for age. Extremities: No clubbing or cyanosis. No edema. Distal pedal pulses are 2+ and equal bilaterally. Neuro: Alert and oriented X 3. No facial asymmetry. No focal deficit. Moves all extremities spontaneously. Psych:  Responds to questions appropriately with a normal affect.   EKG:  The EKG was personally reviewed and demonstrates: 2-1 AV block, 42 bpm, RBBB Telemetry:  Telemetry was personally reviewed and demonstrates: Sinus rhythm with intermittent Mobitz type II AV block  Weights: Filed Weights   10/10/20 1620  Weight: 55 kg    Relevant CV Studies:  2D echo 10/11/2020: Pending   Laboratory Data:  Chemistry Recent Labs  Lab 10/10/20 1624 10/11/20 0626  NA 135 137  K 4.5  4.2  CL 100 100  CO2 27 27  GLUCOSE 360* 226*  BUN 37* 26*  CREATININE 1.12* 0.74  CALCIUM 8.5* 8.4*  GFRNONAA 47* >60  ANIONGAP 8 10    Recent Labs  Lab 10/10/20 1624  PROT 6.9  ALBUMIN 3.9  AST 21  ALT 17  ALKPHOS 65  BILITOT 0.5   Hematology Recent Labs  Lab 10/10/20 1624 10/11/20 0626  WBC 8.6 8.1  RBC 3.94 3.85*  HGB 12.0 12.1  HCT 36.4 35.2*  MCV 92.4 91.4  MCH 30.5 31.4  MCHC 33.0 34.4  RDW 13.0 13.1  PLT 184 163    Cardiac EnzymesNo results for input(s): TROPONINI in the last 168 hours. No results for input(s): TROPIPOC in the last 168 hours.  BNPNo results for input(s): BNP, PROBNP in the last 168 hours.  DDimer  Recent Labs  Lab 10/10/20 1624  DDIMER 0.70*    Radiology/Studies:  CT HEAD WO CONTRAST ( )  Result Date: 10/10/2020 IMPRESSION: Negative for acute traumatic injury. Electronically Signed   By: Tish Frederickson M.D.   On: 10/10/2020 17:37   MR BRAIN WO CONTRAST  Result Date: 10/11/2020 IMPRESSION: 1. No acute intracranial abnormality. 2. Findings of chronic ischemic microangiopathy and generalized volume loss. Electronically Signed   By: Deatra Robinson M.D.   On: 10/11/2020 01:26   DG Chest Portable 1 View  Result Date: 10/10/2020 IMPRESSION: No active disease. Electronically Signed   By: Tish Frederickson M.D.   On: 10/10/2020 17:31    Assessment and Plan:   1.  Syncope with associated 2-1 AV block with intermittent Mobitz type II AV block: -Appears to be the likely culprit for her syncopal episodes -No significant laboratory derangements -Not on beta-blocker or calcium channel blocker in the outpatient setting, continue to avoid use -Continue to monitor on telemetry -No indication for venous temp wire or transcutaneous pacing at this time given normal mentation and normal BP -Recommend pacer pads be placed at bedside -We will have EP evaluate the patient tomorrow, if possible, for consideration of PPM implantation  2.  Chest pain: -Currently chest pain-free -High-sensitivity troponin negative x2 -Await echo  3.  Unintentional weight loss: -Appears to possibly be in the setting of the COVID pandemic -Defer further management to internal medicine  4.  Elevated D-dimer: -Management per internal medicine  For questions or updates, please contact [facility] Please consult [email or web address] for contact info under Cardiology/STEMI.   Signed, Eula Listen, PA-C [facility] Pager: [phone] 10/11/2020, 12:56 PM

## 2020-10-11 NOTE — ED Notes (Signed)
Patient returns

## 2020-10-11 NOTE — ED Notes (Signed)
Pts soiled clothing removed and placed in a patients belonging bag - pt provided a new brief & non slip socks.

## 2020-10-11 NOTE — ED Notes (Signed)
Cardiology at bedside.

## 2020-10-11 NOTE — Consult Note (Signed)
Cardiology Consultation:   Patient ID: Amy Calhoun; [id number]; December 10, 1928   Admit date: 10/10/2020 Date of Consult: 10/11/2020  Primary Care Provider: Allegra Grana, FNP Primary Cardiologist: New to [facility] - consult by End Primary Electrophysiologist:  None   Patient Profile:   Amy Calhoun is a 87 y.o. female with a hx of insulin-dependent diabetes mellitus with peripheral neuropathy, COVID in 01/2018, unintentional 30 pound weight loss throughout the COVID pandemic, HTN, orthostatic hypotension, HLD, hypothyroidism, and GERD who is being seen today for the evaluation of presyncope at the request of Dr. Georgeann Oppenheim.  History of Present Illness:   [name] has no previously known cardiac history.  Patient's daughter indicates the patient has previously had to stop lisinopril secondary to low BP with associated orthostasis.  Patient resides at a retirement facility and lives independently.  She was eating lunch on 9/26 when she suffered sudden onset of LOC that was witnessed by facility staff.  They have indicated that this episode lasted approximately 3 to 5 seconds.  Upon coming to, they checked her blood sugar and found it to be in the 200s.  She had stable BP.  They reported to family that the patient's heart rate was in the 40s bpm.  Patient was back to baseline shortly thereafter and actually drove her scooter back to her room, and was assisted by staff.  Later that afternoon, while the patient's daughter was present, the patient was laying in her bed when she developed a "stiffness" with associated blurry vision.  The patient's daughter indicated this episode lasted again for approximate 3 to 5 seconds.  Following this, the patient did note some mild chest discomfort.  No associated diaphoresis or dyspnea.  Since these episodes, the patient has felt like her vision has been cloudy.  Otherwise, she is without complaint.  Upon arrival to [facility], twelve-lead EKG showed 2-1 AV block  as outlined below.  Chest x-ray showed no active disease.  CT head was nonacute.  MRI brain showed no acute pathology with chronic microvascular age-related changes.  High-sensitivity troponin negative x2.  D-dimer mildly elevated at 0.7.  TSH and free T4 normal.  Potassium 4.5.  Glucose 360.  BUN/SCR 37/1.12 which appears to be her approximate baseline.  Hgb 12.0.  BP has been stable.  Telemetry has demonstrated sinus rhythm with intermittent Mobitz type II AV block with rates ranging from the 70s to 40s bpm.  She is not on any beta-blockers or calcium channel blockers.  In the ED, she received 2 separate 500 cc normal saline boluses.  Currently, she is without complaint.    Past Medical History:  Diagnosis Date   Allergy    Ambulates with cane    Anemia    At high risk for falls    Cataract    removed both eyes   Chronic kidney disease 08/07/2016   Chronic renal failure, stage III   Depression    Esophageal dysphagia    Gastric ulcer    GERD (gastroesophageal reflux disease)    Hyperlipidemia    Hypertension    Hypothyroidism    IDDM (insulin dependent diabetes mellitus)    Ischemic colitis (HCC)    Neuromuscular disorder (HCC)    neuropathy   Osteoarthritis    Ulcer of esophagus     Past Surgical History:  Procedure Laterality Date   ABDOMINAL HYSTERECTOMY     CATARACT EXTRACTION, BILATERAL     CHOLECYSTECTOMY     COLONOSCOPY  2010   ESOPHAGEAL DILATION  08/07/2016   usually a couple times a year, last time 07/30/16   GALLBLADDER SURGERY     LUMBAR LAMINECTOMY/DECOMPRESSION MICRODISCECTOMY N/A 08/10/2016   Procedure: BILATERAL HEMILAMINECTOMY LUMBAR THREE-FOUR,LUMBAR FOUR-FIVE,LEFT LUMBAR FIVE-SACRAL ONE HEMILAMINECTOMY AND DECOMPRESSION;  Surgeon: Coletta Memos, MD;  Location: [facility] OR;  Service: Neurosurgery;  Laterality: N/A;   THYROIDECTOMY     UPPER GASTROINTESTINAL ENDOSCOPY       Home Meds: Prior to Admission medications   Medication Sig Start Date End Date Taking?  Authorizing Provider  Acetaminophen-Codeine 300-30 MG tablet TAKE 1 TABLET BY MOUTH EVERY 12 (TWELVE) HOURS AS NEEDED FOR PAIN (MODERATE TO SEVERE PAIN). 09/27/20  Yes Dale , MD  amitriptyline (ELAVIL) 75 MG tablet TAKE 2 TABLETS AT BEDTIME 03/11/20  Yes Allegra Grana, FNP  ascorbic acid (VITAMIN C) 500 MG tablet Take 500 mg by mouth daily.   Yes [provider]  Cholecalciferol (VITAMIN D) 2000 units tablet Take 2,000 Units by mouth daily.    Yes [provider]  ezetimibe (ZETIA) 10 MG tablet Take 1 tablet (10 mg total) by mouth daily. 06/29/20  Yes Arnett, Lyn Records, FNP  gabapentin (NEURONTIN) 100 MG capsule Take 2 capsules (200 mg total) by mouth 2 (two) times daily. 04/22/20  Yes Arnett, Lyn Records, FNP  Insulin Glargine (LANTUS SOLOSTAR) 100 UNIT/ML Solostar Pen Inject 8 Units into the skin at bedtime. 01/01/19  Yes Shamleffer, Konrad Dolores, MD  insulin lispro (HUMALOG KWIKPEN) 100 UNIT/ML KwikPen Inject 8 Units into the skin 3 (three) times daily. Max Daily 30 units 09/21/20  Yes Shamleffer, Konrad Dolores, MD  Iron, Ferrous Sulfate, 325 (65 Fe) MG TABS Take 325 mg by mouth every other day. Patient taking differently: Take 325 mg by mouth every other day. Takes daily 05/26/20  Yes McLean-Scocuzza, Pasty Spillers, MD  levothyroxine (SYNTHROID) 150 MCG tablet TAKE 1 TABLET DAILY 01/28/20  Yes Allegra Grana, FNP  omeprazole (PRILOSEC) 40 MG capsule TAKE 1 CAPSULE TWICE A DAY 07/25/20  Yes Rachael Fee, MD  pyridOXINE (VITAMIN B-6) 100 MG tablet Take 100 mg by mouth daily.   Yes [provider]  albuterol (PROVENTIL HFA;VENTOLIN HFA) 108 (90 Base) MCG/ACT inhaler Inhale 2 puffs into the lungs every 6 (six) hours as needed for wheezing or shortness of breath. 01/07/17   Allegra Grana, FNP  Continuous Blood Gluc Sensor (FREESTYLE LIBRE 14 DAY SENSOR) MISC 1 Package by Does not apply route every 14 (fourteen) days. 09/03/19   Shamleffer, Konrad Dolores, MD   CRANBERRY PO Take 1 tablet by mouth every Monday, Wednesday, and Friday. Patient not taking: No sig reported    [provider]  cyanocobalamin (,VITAMIN B-12,) 1000 MCG/ML injection INJECT WEEKLY FOR 4 WEEKS THEN INJECT MONTHLY 07/13/20   Allegra Grana, FNP  docusate sodium (COLACE) 100 MG capsule Take 1 capsule (100 mg total) by mouth 2 (two) times daily. 03/31/16   Katharina Caper, MD  glucagon (GLUCAGON EMERGENCY) 1 MG injection Inject 1 mg into the vein once as needed. For hypoglycemic episodes. E11.42 Patient not taking: No sig reported 03/13/16   Tommie Sams, DO  Insulin Pen Needle 32G X 4 MM MISC 1 Device by Does not apply route in the morning, at noon, in the evening, and at bedtime. 11/13/19   Shamleffer, Konrad Dolores, MD  lisinopril (ZESTRIL) 2.5 MG tablet Take 1 tablet (2.5 mg total) by mouth daily. Take for blood pressure greater than 140/90  Patient not taking: No sig reported 07/14/19   Arnett, Margaret G, FNP  Misc. Devices (BARIATRIC ROLLATOR) MISC Use as needed 07/31/19   Arnett, Margaret G, FNP  ondansetron (ZOFRAN ODT) 4 MG disintegrating tablet Take 1 tablet (4 mg total) by mouth every 8 (eight) hours as needed for nausea or vomiting. 08/21/18   Guse, Lauren M, FNP  riboflavin (VITAMIN B-2) 100 MG TABS tablet Take 100 mg by mouth daily. Patient not taking: No sig reported    [provider]  Turmeric 500 MG TABS Take 750 mg by mouth 2 (two) times daily. Patient not taking: No sig reported    [provider]    Inpatient Medications: Scheduled Meds:  amitriptyline  150 mg Oral QHS   docusate sodium  100 mg Oral BID   enoxaparin (LOVENOX) injection  30 mg Subcutaneous Q24H   ezetimibe  10 mg Oral Daily   ferrous sulfate  325 mg Oral Daily   hydrocerin   Topical BID   insulin aspart  0-15 Units Subcutaneous TID WC   insulin aspart  0-5 Units Subcutaneous QHS   insulin glargine-yfgn  8 Units Subcutaneous QHS   levothyroxine  150 mcg  Oral Daily   lisinopril  2.5 mg Oral Daily   pantoprazole  80 mg Oral Daily   pyridOXINE  100 mg Oral Daily   sodium chloride flush  3 mL Intravenous Q12H   Continuous Infusions:  PRN Meds: albuterol, hydrALAZINE  Allergies:   Allergies  Allergen Reactions   Pravachol [Pravastatin] Other (See Comments)    States she refused due to side effects    Social History:   Social History   Socioeconomic History   Marital status: Widowed    Spouse name: Not on file   Number of children: Not on file   Years of education: Not on file   Highest education level: Not on file  Occupational History   Occupation: retired  Tobacco Use   Smoking status: Never   Smokeless tobacco: Never  Vaping Use   Vaping Use: Never used  Substance and Sexual Activity   Alcohol use: No   Drug use: No   Sexual activity: Not Currently    Partners: Male  Other Topics Concern   Not on file  Social History Narrative   [facility], independent living.    Provide her with cleaning, one meal per day.    Social Determinants of Health   Financial Resource Strain: Low Risk    Difficulty of Paying Living Expenses: Not hard at all  Food Insecurity: No Food Insecurity   Worried About Running Out of Food in the Last Year: Never true   Ran Out of Food in the Last Year: Never true  Transportation Needs: No Transportation Needs   Lack of Transportation (Medical): No   Lack of Transportation (Non-Medical): No  Physical Activity: Sufficiently Active   Days of Exercise per Week: 3 days   Minutes of Exercise per Session: 50 min  Stress: No Stress Concern Present   Feeling of Stress : Not at all  Social Connections: Unknown   Frequency of Communication with Friends and Family: Not on file   Frequency of Social Gatherings with Friends and Family: More than three times a week   Attends Religious Services: Not on file   Active Member of Clubs or Organizations: Not on file   Attends Club or  Organization Meetings: Not on file   Marital Status: Not on file  Intimate   Partner Violence: Not At Risk   Fear of Current or Ex-Partner: No   Emotionally Abused: No   Physically Abused: No   Sexually Abused: No     Family History:   Family History  Problem Relation Age of Onset   Diabetes Mother    Arthritis Mother    Hyperlipidemia Mother    Mental illness Mother    Heart disease Father    Mental illness Sister    Arthritis Maternal Grandmother    Arthritis Maternal Grandfather    Colon cancer Neg Hx    Stomach cancer Neg Hx    Esophageal cancer Neg Hx    Rectal cancer Neg Hx    Colon polyps Neg Hx     ROS:  Review of Systems  Constitutional:  Positive for malaise/fatigue and weight loss. Negative for chills, diaphoresis and fever.  HENT:  Negative for congestion.   Eyes:  Positive for blurred vision. Negative for discharge and redness.  Respiratory:  Negative for cough, sputum production, shortness of breath and wheezing.   Cardiovascular:  Positive for chest pain. Negative for palpitations, orthopnea, claudication, leg swelling and PND.  Gastrointestinal:  Negative for abdominal pain, heartburn, nausea and vomiting.  Musculoskeletal:  Negative for falls and myalgias.  Skin:  Negative for rash.  Neurological:  Positive for loss of consciousness and weakness. Negative for dizziness, tingling, tremors, sensory change, speech change and focal weakness.  Endo/Heme/Allergies:  Does not bruise/bleed easily.  Psychiatric/Behavioral:  Negative for substance abuse. The patient is not nervous/anxious.   All other systems reviewed and are negative.    Physical Exam/Data:   Vitals:   10/11/20 0700 10/11/20 0800 10/11/20 1107 10/11/20 1221  BP: (!) 141/65 (!) 136/55 (!) 146/61 (!) 134/48  Pulse: 68 66 70 70  Resp: 15 14 14 15   Temp:      TempSrc:      SpO2: 96% 96% 96% 93%  Weight:      Height:        Intake/Output Summary (Last 24 hours) at 10/11/2020 1256 Last data  filed at 10/11/2020 0020 Gross per 24 hour  Intake 500 ml  Output 600 ml  Net -100 ml   Filed Weights   10/10/20 1620  Weight: 55 kg   Body mass index is 18.99 kg/m.   Physical Exam: General: Elderly appearing and hard of hearing, in no acute distress. Head: Normocephalic, atraumatic, sclera non-icteric, no xanthomas, nares without discharge.  Neck: Negative for carotid bruits. JVD not elevated. Lungs: Clear bilaterally to auscultation without wheezes, rales, or rhonchi. Breathing is unlabored. Heart: RRR with S1 S2. I/VI systolic murmur LSB, no rubs, or gallops appreciated. Abdomen: Soft, non-tender, non-distended with normoactive bowel sounds. No hepatomegaly. No rebound/guarding. No obvious abdominal masses. Msk:  Strength and tone appear normal for age. Extremities: No clubbing or cyanosis. No edema. Distal pedal pulses are 2+ and equal bilaterally. Neuro: Alert and oriented X 3. No facial asymmetry. No focal deficit. Moves all extremities spontaneously. Psych:  Responds to questions appropriately with a normal affect.   EKG:  The EKG was personally reviewed and demonstrates: 2-1 AV block, 42 bpm, RBBB Telemetry:  Telemetry was personally reviewed and demonstrates: Sinus rhythm with intermittent Mobitz type II AV block  Weights: Filed Weights   10/10/20 1620  Weight: 55 kg    Relevant CV Studies:  2D echo 10/11/2020: Pending   Laboratory Data:  Chemistry Recent Labs  Lab 10/10/20 1624 10/11/20 0626  NA 135 137  K 4.5  4.2  CL 100 100  CO2 27 27  GLUCOSE 360* 226*  BUN 37* 26*  CREATININE 1.12* 0.74  CALCIUM 8.5* 8.4*  GFRNONAA 47* >60  ANIONGAP 8 10    Recent Labs  Lab 10/10/20 1624  PROT 6.9  ALBUMIN 3.9  AST 21  ALT 17  ALKPHOS 65  BILITOT 0.5   Hematology Recent Labs  Lab 10/10/20 1624 10/11/20 0626  WBC 8.6 8.1  RBC 3.94 3.85*  HGB 12.0 12.1  HCT 36.4 35.2*  MCV 92.4 91.4  MCH 30.5 31.4  MCHC 33.0 34.4  RDW 13.0 13.1  PLT 184 163    Cardiac EnzymesNo results for input(s): TROPONINI in the last 168 hours. No results for input(s): TROPIPOC in the last 168 hours.  BNPNo results for input(s): BNP, PROBNP in the last 168 hours.  DDimer  Recent Labs  Lab 10/10/20 1624  DDIMER 0.70*    Radiology/Studies:  CT HEAD WO CONTRAST ( )  Result Date: 10/10/2020 IMPRESSION: Negative for acute traumatic injury. Electronically Signed   By: Tish Frederickson M.D.   On: 10/10/2020 17:37   MR BRAIN WO CONTRAST  Result Date: 10/11/2020 IMPRESSION: 1. No acute intracranial abnormality. 2. Findings of chronic ischemic microangiopathy and generalized volume loss. Electronically Signed   By: Deatra Robinson M.D.   On: 10/11/2020 01:26   DG Chest Portable 1 View  Result Date: 10/10/2020 IMPRESSION: No active disease. Electronically Signed   By: Tish Frederickson M.D.   On: 10/10/2020 17:31    Assessment and Plan:   1.  Syncope with associated 2-1 AV block with intermittent Mobitz type II AV block: -Appears to be the likely culprit for her syncopal episodes -No significant laboratory derangements -Not on beta-blocker or calcium channel blocker in the outpatient setting, continue to avoid use -Continue to monitor on telemetry -No indication for venous temp wire or transcutaneous pacing at this time given normal mentation and normal BP -Recommend pacer pads be placed at bedside -We will have EP evaluate the patient tomorrow, if possible, for consideration of PPM implantation  2.  Chest pain: -Currently chest pain-free -High-sensitivity troponin negative x2 -Await echo  3.  Unintentional weight loss: -Appears to possibly be in the setting of the COVID pandemic -Defer further management to internal medicine  4.  Elevated D-dimer: -Management per internal medicine  For questions or updates, please contact [facility] Please consult [email or web address] for contact info under Cardiology/STEMI.   Signed, Eula Listen, PA-C [facility] Pager: [phone] 10/11/2020, 12:56 PM

## 2020-10-11 NOTE — Progress Notes (Signed)
*  PRELIMINARY RESULTS* Echocardiogram 2D Echocardiogram has been performed.  Amy Calhoun 10/11/2020, 9:00 AM

## 2020-10-11 NOTE — ED Notes (Signed)
Patient assisted to toilet with use of walker. Patient required minimal assistance.

## 2020-10-11 NOTE — ED Notes (Signed)
Phlebotomy @ the bedside. 

## 2020-10-11 NOTE — ED Notes (Signed)
Called by daughter to bedside, states mom was shaking or having a seizure. Patient is alert but very pale, lips with a blue tinge. HR in the low 30s. Repeat EKG done and shown to attending.

## 2020-10-11 NOTE — ED Notes (Signed)
Pt back from MRI 

## 2020-10-11 NOTE — Progress Notes (Signed)
Eeg done 

## 2020-10-11 NOTE — ED Notes (Signed)
Pt to MRI

## 2020-10-11 NOTE — ED Notes (Signed)
Pts daughter took home her hearing aid.

## 2020-10-11 NOTE — Procedures (Signed)
Patient Name: Tamzin Bertling  MRN: [id number]  Epilepsy Attending: Charlsie Quest  Referring Physician/Provider: Dr Londell Moh Date: 10/11/2020 Duration: 20.38 mins  Patient history: 87 year old female with syncope.  EEG to evaluate for seizures.  Level of alertness: Awake  AEDs during EEG study: None  Technical aspects: This EEG study was done with scalp electrodes positioned according to the 10-20 International system of electrode placement. Electrical activity was acquired at a sampling rate of 500Hz  and reviewed with a high frequency filter of 70Hz  and a low frequency filter of 1Hz . EEG data were recorded continuously and digitally stored.   Description: The posterior dominant rhythm consists of 8-9 Hz activity of moderate voltage (25-35 uV) seen predominantly in posterior head regions, symmetric and reactive to eye opening and eye closing.  Intermittent left temporal 2-3Hz  delta slowing was also noted.  Physiologic photic driving was not seen during photic stimulation.  Hyperventilation was not performed.     ABNORMALITY -Intermittent slow, left temporal region  IMPRESSION: This study is suggestive of nonspecific cortical dysfunction in left temporal region.  No seizures or epileptiform discharges were seen throughout the recording.   Khristie Sak 

## 2020-10-11 NOTE — Interval H&P Note (Signed)
History and Physical Interval Note:  10/11/2020 5:05 PM  Amy Calhoun  has presented today for surgery, with the diagnosis of syncope and complete heart block.  The various methods of treatment have been discussed with the patient and family. After consideration of risks, benefits and other options for treatment, the patient has consented to  Procedure(s): LEFT HEART CATH AND CORONARY ANGIOGRAPHY (N/A) and temporary transvenous pacemaker placement as a surgical intervention.  The patient's history has been reviewed, patient examined, no change in status, stable for surgery.  I have reviewed the patient's chart and labs.  Questions were answered to the patient's satisfaction.    Cath Lab Visit (complete for each Cath Lab visit)  Clinical Evaluation Leading to the Procedure:   ACS: Yes.    Non-ACS:  N/A  Ettore Trebilcock

## 2020-10-11 NOTE — ED Notes (Signed)
Patient placed on bedside Zoll.Team notified

## 2020-10-11 NOTE — Progress Notes (Signed)
Inpatient Diabetes Program Recommendations  AACE/ADA: New Consensus Statement on Inpatient Glycemic Control   Target Ranges:  Prepandial:   less than 140 mg/dL      Peak postprandial:   less than 180 mg/dL (1-2 hours)      Critically ill patients:  140 - 180 mg/dL   Results for Amy Calhoun, Amy Calhoun (MRN [id number]) as of 10/11/2020 08:47  Ref. Range 10/10/2020 18:55 10/10/2020 21:16 10/11/2020 05:48  Glucose-Capillary Latest Ref Range: 70 - 99 mg/dL 967 (H) 591 (H)  Semglee 8 units   208 (H)    Review of Glycemic Control  Diabetes history: DM2 Outpatient Diabetes medications: Lantus 8 units QHS, Humalog 8 units TID with meals plus additional units for correction Current orders for Inpatient glycemic control: Semglee 8 units QHS, Novolog 0-15 units TID with meals, Novolog 0-5 units QHS  Inpatient Diabetes Program Recommendations:    HbgA1C:  A1C 9.4% on 09/21/20 at last visit with Endocrinologist.   NOTE: Per chart, patient sees Dr. Lonzo Cloud (Endocrinologist) for DM management and was last seen 09/21/20. Per office visit note on 09/21/20, patient was to continue Lantus 8 units QHS, Humalog increased from 6 to 8 units TID with meals, and Novolog 0-6 units for correction. No recommendations for insulin changes at this time. Will follow along.  Thanks, Orlando Penner, RN, MSN, CDE Diabetes Coordinator Inpatient Diabetes Program [phone] (Team Pager from 8am to 5pm)

## 2020-10-11 NOTE — Progress Notes (Signed)
    Notified by RN that the patient was shaking per daughter with heart rates in the 30s bpm. Patient was pale. She was alert and responsive. Pulse was not lost. EKG was performed which showed complete heart block. Upon review of telemetry showed a 6 second pause and complete heart block. BP stable in the 140s systolic. She remains in complete heart block at this time. Plan is for LHC and venous temp wire at this time. EP is aware of the patient and will see the patient in consult. Risks and benefits of cardiac catheterization have been discussed with the patient including risks of bleeding, bruising, infection, kidney damage, stroke, heart attack, and death. The patient understands these risks and is willing to proceed with the procedure. All questions have been answered and concerns listened to.

## 2020-10-11 NOTE — Progress Notes (Signed)
Amy NOTE    Amy Calhoun  CWC:[id number] DOB: 1928/04/01 DOA: 10/10/2020 PCP: Allegra Grana, FNP   Brief Narrative: 87 y.o. female with medical history significant for hyperlipidemia, hypertension, insulin-dependent diabetes mellitus, hypothyroid, GERD, who presents to the emergency department for chief concerns of presyncopal episode.   Per daughter over the phone, patient had her first episode of syncope/presyncope during dining room approximately at approximately 12:15PM during lunch.  It was reported that she lost consciousness, for about 4-5 seconds, her head fell on her plate.  There was no abnormal movements noted at this time.   Patient was helped to her room for rest.   The second epsisode, was when patient was laying in bed, per daughter, her body stiffened up, patient started snoring.    She had two episodes of pre-syncope today, 10/10/20.  She has never experienced this before.  Patient states she was aware of the second episode.   Patient states that she was on the bed, she felt herself stiffen up, and like she was floating. She endorses blurry vision that started earlier today.  The blurry vision has now resolved.  She had a 30 pound unintentional weight lost.  Patient states the weight loss was in last 6 months; granddaughter, at bedside, states the 30 pound unintentional weight loss was since the pandemic started which was over 2 years ago.  Cardiology consulted.  Findings suspicious for bradycardia and heart block as culprit for syncope/presyncope.  Pacer pads to be placed at bedside.  Cardiology will arrange for EP evaluation.   Assessment & Plan:   Principal Problem:   Syncope Active Problems:   Esophageal dysmotility   Hyperlipidemia   Hypothyroidism   HTN (hypertension), malignant   CKD (chronic kidney disease) stage 3, GFR 30-59 ml/min (HCC)   Gastroesophageal reflux disease without esophagitis   Fatigue   Depression   Weakness of both lower  extremities   Type 2 diabetes mellitus with hyperglycemia, with long-term current use of insulin (HCC)   B12 deficiency   Abnormal gait   Symptomatic bradycardia  Syncope/near syncope Likely culprit appears to be heart block/symptomatic bradycardia Cardiology consulted Patient not on any AV nodal blocking agents MRI negative EEG negative Plan: Pacer pads at bedside Cardiology to arrange for EP evaluation during hospitalization Fall precautions Follow-up TTE Continue telemetry Hold sedatives and anticholinergics  2:1 AV block with intermittent Mobitz type II AV block Symptomatic bradycardia See above for plan  Unintentional weight loss Unclear etiology Will request nutrition evaluation 3 times daily Glucerna  Elevated D-dimer Unclear etiology Will rule out PE in the setting of syncopal event CTA thorax ordered  Essential hypertension PTA Zestril  Hyperlipidemia PTA Zetia  Depression PTA Elavil  Insulin-dependent diabetes mellitus Semglee 8 units daily Moderate sliding scale Carb modified diet CBGs AC at bedtime  GERD PPI  DVT prophylaxis: SQ Lovenox Code Status: Full Family Communication: Daughter Amy Calhoun [phone] on 9/27 Disposition Plan: Status is: Observation  The patient will require care spanning > 2 midnights and should be moved to inpatient because: Inpatient level of care appropriate due to severity of illness  Dispo: The patient is from: ALF              Anticipated d/c is to: ALF              Patient currently is not medically stable to d/c.   Difficult to place patient No  Symptomatic bradycardia, AV block, near syncope.  Will likely need pacemaker.  Cardiology/EP  to follow-up     Level of care: Med-Surg  Consultants:  Cardiology-[facility]  Procedures:  None  Antimicrobials: None   Subjective: Seen and examined.  Resting comfortably in bed.  No visible distress.  No pain complaints.  Objective: Vitals:   10/11/20 0700  10/11/20 0800 10/11/20 1107 10/11/20 1221  BP: (!) 141/65 (!) 136/55 (!) 146/61 (!) 134/48  Pulse: 68 66 70 70  Resp: 15 14 14 15   Temp:      TempSrc:      SpO2: 96% 96% 96% 93%  Weight:      Height:        Intake/Output Summary (Last 24 hours) at 10/11/2020 1434 Last data filed at 10/11/2020 0020 Gross per 24 hour  Intake 500 ml  Output 600 ml  Net -100 ml   Filed Weights   10/10/20 1620  Weight: 55 kg    Examination:  General exam: Appears calm and comfortable  Respiratory system: Clear to auscultation. Respiratory effort normal. Cardiovascular system: S1-S2, bradycardic, regular rhythm, no murmurs, no pedal edema Gastrointestinal system: Abdomen is nondistended, soft and nontender. No organomegaly or masses felt. Normal bowel sounds heard. Central nervous system: Alert and oriented. No focal neurological deficits. Extremities: Symmetric 5 x 5 power. Skin: No rashes, lesions or ulcers Psychiatry: Judgement and insight appear normal. Mood & affect appropriate.     Data Reviewed: I have personally reviewed following labs and imaging studies  CBC: Recent Labs  Lab 10/10/20 1624 10/11/20 0626  WBC 8.6 8.1  NEUTROABS 4.9  --   HGB 12.0 12.1  HCT 36.4 35.2*  MCV 92.4 91.4  PLT 184 163   Basic Metabolic Panel: Recent Labs  Lab 10/10/20 1624 10/11/20 0626  NA 135 137  K 4.5 4.2  CL 100 100  CO2 27 27  GLUCOSE 360* 226*  BUN 37* 26*  CREATININE 1.12* 0.74  CALCIUM 8.5* 8.4*  MG 1.9  --    GFR: Estimated Creatinine Clearance: 40.6 mL/min (by C-G formula based on SCr of 0.74 mg/dL). Liver Function Tests: Recent Labs  Lab 10/10/20 1624  AST 21  ALT 17  ALKPHOS 65  BILITOT 0.5  PROT 6.9  ALBUMIN 3.9   No results for input(s): LIPASE, AMYLASE in the last 168 hours. No results for input(s): AMMONIA in the last 168 hours. Coagulation Profile: No results for input(s): INR, PROTIME in the last 168 hours. Cardiac Enzymes: No results for input(s):  CKTOTAL, CKMB, CKMBINDEX, TROPONINI in the last 168 hours. BNP (last 3 results) No results for input(s): PROBNP in the last 8760 hours. HbA1C: No results for input(s): HGBA1C in the last 72 hours. CBG: Recent Labs  Lab 10/10/20 1855 10/10/20 2116 10/11/20 0548 10/11/20 0909 10/11/20 1148  GLUCAP 248* 211* 208* 216* 264*   Lipid Profile: No results for input(s): CHOL, HDL, LDLCALC, TRIG, CHOLHDL, LDLDIRECT in the last 72 hours. Thyroid Function Tests: Recent Labs    10/10/20 1624  TSH 3.281  FREET4 1.06   Anemia Panel: Recent Labs    10/10/20 1924  VITAMINB12 912   Sepsis Labs: Recent Labs  Lab 10/10/20 1924  PROCALCITON <0.10    Recent Results (from the past 240 hour(s))  Resp Panel by RT-PCR (Flu A&B, Covid) Nasopharyngeal Swab     Status: None   Collection Time: 10/10/20  4:24 PM   Specimen: Nasopharyngeal Swab; Nasopharyngeal(NP) swabs in vial transport medium  Result Value Ref Range Status   SARS Coronavirus 2 by RT PCR NEGATIVE NEGATIVE Final  Comment: (NOTE) SARS-CoV-2 target nucleic acids are NOT DETECTED.  The SARS-CoV-2 RNA is generally detectable in upper respiratory specimens during the acute phase of infection. The lowest concentration of SARS-CoV-2 viral copies this assay can detect is 138 copies/mL. A negative result does not preclude SARS-Cov-2 infection and should not be used as the sole basis for treatment or other patient management decisions. A negative result may occur with  improper specimen collection/handling, submission of specimen other than nasopharyngeal swab, presence of viral mutation(s) within the areas targeted by this assay, and inadequate number of viral copies(<138 copies/mL). A negative result must be combined with clinical observations, patient history, and epidemiological information. The expected result is Negative.  Fact Sheet for Patients:  BloggerCourse.com  Fact Sheet for Healthcare  Providers:  SeriousBroker.it  This test is no t yet approved or cleared by the [place] FDA and  has been authorized for detection and/or diagnosis of SARS-CoV-2 by FDA under an Emergency Use Authorization (EUA). This EUA will remain  in effect (meaning this test can be used) for the duration of the COVID-19 declaration under Section 564(b)(1) of the Act, 21 U.S.C.section 360bbb-3(b)(1), unless the authorization is terminated  or revoked sooner.       Influenza A by PCR NEGATIVE NEGATIVE Final   Influenza B by PCR NEGATIVE NEGATIVE Final    Comment: (NOTE) The Xpert Xpress SARS-CoV-2/FLU/RSV plus assay is intended as an aid in the diagnosis of influenza from Nasopharyngeal swab specimens and should not be used as a sole basis for treatment. Nasal washings and aspirates are unacceptable for Xpert Xpress SARS-CoV-2/FLU/RSV testing.  Fact Sheet for Patients: BloggerCourse.com  Fact Sheet for Healthcare Providers: SeriousBroker.it  This test is not yet approved or cleared by the [place] FDA and has been authorized for detection and/or diagnosis of SARS-CoV-2 by FDA under an Emergency Use Authorization (EUA). This EUA will remain in effect (meaning this test can be used) for the duration of the COVID-19 declaration under Section 564(b)(1) of the Act, 21 U.S.C. section 360bbb-3(b)(1), unless the authorization is terminated or revoked.  Performed at [facility], [address], [place], [place] [address]          Radiology Studies: CT HEAD WO CONTRAST ( )  Result Date: 10/10/2020 CLINICAL DATA:  Dizziness. EXAM: CT HEAD WITHOUT CONTRAST TECHNIQUE: Contiguous axial images were obtained from the base of the skull through the vertex without intravenous contrast. COMPARISON:  CT head 03/01/2016 BRAIN: BRAIN Cerebral ventricle sizes are concordant with the degree of cerebral volume  loss. Patchy and confluent areas of decreased attenuation are noted throughout the deep and periventricular white matter of the cerebral hemispheres bilaterally, compatible with chronic microvascular ischemic disease. No evidence of large-territorial acute infarction. No parenchymal hemorrhage. No mass lesion. No extra-axial collection. No mass effect or midline shift. No hydrocephalus. Basilar cisterns are patent. Vascular: No hyperdense vessel. Atherosclerotic calcifications are present within the cavernous internal carotid and vertebral arteries. Skull: No acute fracture or focal lesion. Sinuses/Orbits: Paranasal sinuses and mastoid air cells are clear. The orbits are unremarkable. Other: None. IMPRESSION: Negative for acute traumatic injury. Electronically Signed   By: Tish Frederickson M.D.   On: 10/10/2020 17:37   MR BRAIN WO CONTRAST  Result Date: 10/11/2020 CLINICAL DATA:  Transient ischemic attack EXAM: MRI HEAD WITHOUT CONTRAST TECHNIQUE: Multiplanar, multiecho pulse sequences of the brain and surrounding structures were obtained without intravenous contrast. COMPARISON:  None. FINDINGS: Brain: No acute infarct, mass effect or extra-axial collection. No acute  or chronic hemorrhage. Hyperintense T2-weighted signal is moderately widespread throughout the white matter. Generalized volume loss without a clear lobar predilection. 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 The midline structures are normal. Vascular: Major flow voids are preserved. Skull and upper cervical spine: Normal calvarium and skull base. Visualized upper cervical spine and soft tissues are normal. Sinuses/Orbits:No paranasal sinus fluid levels or advanced mucosal thickening. No mastoid or middle ear effusion. Normal orbits. IMPRESSION: 1. No acute intracranial abnormality. 2. Findings of chronic ischemic microangiopathy and generalized volume loss. Electronically Signed   By: Deatra Robinson M.D.   On: 10/11/2020 01:26    DG Chest Portable 1 View  Result Date: 10/10/2020 CLINICAL DATA:  Weakness. Bradycardia. Blood sugar 355. Near syncope. EXAM: PORTABLE CHEST 1 VIEW COMPARISON:  None. FINDINGS: The heart and mediastinal contours are unchanged. Aortic calcification. Similar-appearing left base atelectasis versus scarring. No focal consolidation. No pulmonary edema. No pleural effusion. No pneumothorax. No acute osseous abnormality.  Old healed left rib fractures. IMPRESSION: No active disease. Electronically Signed   By: Tish Frederickson M.D.   On: 10/10/2020 17:31   EEG adult  Result Date: 10/11/2020 Charlsie Quest, MD     10/11/2020  1:39 PM Patient Name: Amy Calhoun MRN: [id number] Epilepsy Attending: Charlsie Quest Referring Physician/Provider: Dr Londell Moh Date: 10/11/2020 Duration: 20.38 mins Patient history: 87 year old female with syncope.  EEG to evaluate for seizures. Level of alertness: Awake AEDs during EEG study: None Technical aspects: This EEG study was done with scalp electrodes positioned according to the 10-20 International system of electrode placement. Electrical activity was acquired at a sampling rate of 500Hz  and reviewed with a high frequency filter of 70Hz  and a low frequency filter of 1Hz . EEG data were recorded continuously and digitally stored. Description: The posterior dominant rhythm consists of 8-9 Hz activity of moderate voltage (25-35 uV) seen predominantly in posterior head regions, symmetric and reactive to eye opening and eye closing.  Intermittent left temporal 2-3Hz  delta slowing was also noted.  Physiologic photic driving was not seen during photic stimulation.  Hyperventilation was not performed.   ABNORMALITY -Intermittent slow, left temporal region IMPRESSION: This study is suggestive of nonspecific cortical dysfunction in left temporal region.  No seizures or epileptiform discharges were seen throughout the recording. Charlsie Quest   ECHOCARDIOGRAM COMPLETE  Result  Date: 10/11/2020    ECHOCARDIOGRAM REPORT   Patient Name:   Amy Calhoun Date of Exam: 10/11/2020 Medical Rec #:  [id number]         Height:       67.0 in Accession #:    [id number]        Weight:       121.3 lb Date of Birth:  1928-08-15          BSA:          1.635 m Patient Age:    89 years          BP:           141/65 mmHg Patient Gender: F                 HR:           68 bpm. Exam Location:  [facility] Procedure: 2D Echo, Cardiac Doppler and Color Doppler Indications:     Syncope R55  History:         Patient has no prior history of Echocardiogram examinations.  Risk Factors:Hypertension and Dyslipidemia.  Sonographer:     Cristela Blue Referring Phys:  [id number] AMY N COX Diagnosing Phys: Cristal Deer End MD  Sonographer Comments: Suboptimal apical window and no subcostal window. IMPRESSIONS  1. Left ventricular ejection fraction, by estimation, is 60 to 65%. The left ventricle has normal function. Left ventricular endocardial border not optimally defined to evaluate regional wall motion. There is mild left ventricular hypertrophy. Left ventricular diastolic parameters are consistent with Grade I diastolic dysfunction (impaired relaxation). Elevated left atrial pressure.  2. Right ventricular systolic function is normal. The right ventricular size is normal. Tricuspid regurgitation signal is inadequate for assessing PA pressure.  3. Left atrial size was mildly dilated.  4. The mitral valve is degenerative. Trivial mitral valve regurgitation. No evidence of mitral stenosis. Moderate to severe mitral annular calcification.  5. The aortic valve is tricuspid. There is mild calcification of the aortic valve. There is mild thickening of the aortic valve. Aortic valve regurgitation is mild. Mild to moderate aortic valve sclerosis/calcification is present, without any evidence of aortic stenosis. FINDINGS  Left Ventricle: Left ventricular ejection fraction, by estimation, is 60 to 65%. The left ventricle has  normal function. Left ventricular endocardial border not optimally defined to evaluate regional wall motion. The left ventricular internal cavity size was normal in size. There is mild left ventricular hypertrophy. Left ventricular diastolic parameters are consistent with Grade I diastolic dysfunction (impaired relaxation). Elevated left atrial pressure. Right Ventricle: The right ventricular size is normal. No increase in right ventricular wall thickness. Right ventricular systolic function is normal. Tricuspid regurgitation signal is inadequate for assessing PA pressure. Left Atrium: Left atrial size was mildly dilated. Right Atrium: Right atrial size was normal in size. Pericardium: The pericardium was not well visualized. Mitral Valve: The mitral valve is degenerative in appearance. There is mild thickening of the mitral valve leaflet(s). There is mild calcification of the mitral valve leaflet(s). Moderate to severe mitral annular calcification. Trivial mitral valve regurgitation. No evidence of mitral valve stenosis. MV peak gradient, 11.3 mmHg. The mean mitral valve gradient is 4.0 mmHg. Tricuspid Valve: The tricuspid valve is normal in structure. Tricuspid valve regurgitation is trivial. Aortic Valve: The aortic valve is tricuspid. There is mild calcification of the aortic valve. There is mild thickening of the aortic valve. Aortic valve regurgitation is mild. Mild to moderate aortic valve sclerosis/calcification is present, without any evidence of aortic stenosis. Aortic valve mean gradient measures 4.5 mmHg. Aortic valve peak gradient measures 7.8 mmHg. Aortic valve area, by VTI measures 2.02 cm. Pulmonic Valve: The pulmonic valve was grossly normal. Pulmonic valve regurgitation is not visualized. No evidence of pulmonic stenosis. Aorta: The aortic root is normal in size and structure. Venous: The inferior vena cava was not well visualized. IAS/Shunts: The interatrial septum was not well visualized.  LEFT  VENTRICLE PLAX 2D LVIDd:         3.70 cm  Diastology LVIDs:         2.10 cm  LV e' medial:    3.70 cm/s LV PW:         1.00 cm  LV E/e' medial:  28.6 LV IVS:        1.09 cm  LV e' lateral:   4.90 cm/s LVOT diam:     1.95 cm  LV E/e' lateral: 21.6 LV SV:         60 LV SV Index:   37 LVOT Area:     2.99 cm  RIGHT VENTRICLE  RV Basal diam:  2.20 cm RV S prime:     8.38 cm/s TAPSE (M-mode): 1.7 cm LEFT ATRIUM             Index       RIGHT ATRIUM          Index LA diam:        3.10 cm 1.90 cm/m  RA Area:     6.90 cm LA Vol (A2C):   68.7 ml 42.02 ml/m RA Volume:   10.00 ml 6.12 ml/m LA Vol (A4C):   36.1 ml 22.08 ml/m LA Biplane Vol: 54.9 ml 33.58 ml/m  AORTIC VALVE                    PULMONIC VALVE AV Area (Vmax):    1.68 cm     PV Vmax:        0.98 m/s AV Area (Vmean):   1.58 cm     PV Peak grad:   3.9 mmHg AV Area (VTI):     2.02 cm     RVOT Peak grad: 4 mmHg AV Vmax:           139.50 cm/s AV Vmean:          100.450 cm/s AV VTI:            0.298 m AV Peak Grad:      7.8 mmHg AV Mean Grad:      4.5 mmHg LVOT Vmax:         78.30 cm/s LVOT Vmean:        53.300 cm/s LVOT VTI:          0.202 m LVOT/AV VTI ratio: 0.68  AORTA Ao Root diam: 3.40 cm MITRAL VALVE MV Area (PHT): 2.26 cm     SHUNTS MV Area VTI:   1.53 cm     Systemic VTI:  0.20 m MV Peak grad:  11.3 mmHg    Systemic Diam: 1.95 cm MV Mean grad:  4.0 mmHg MV Vmax:       1.68 m/s MV Vmean:      97.1 cm/s MV Decel Time: 336 msec MV E velocity: 106.00 cm/s MV A velocity: 141.00 cm/s MV E/A ratio:  0.75 Cristal Deer End MD Electronically signed by Yvonne Kendall MD Signature Date/Time: 10/11/2020/12:37:28 PM    Final         Scheduled Meds:  amitriptyline  150 mg Oral QHS   docusate sodium  100 mg Oral BID   enoxaparin (LOVENOX) injection  30 mg Subcutaneous Q24H   ezetimibe  10 mg Oral Daily   ferrous sulfate  325 mg Oral Daily   hydrocerin   Topical BID   insulin aspart  0-15 Units Subcutaneous TID WC   insulin aspart  0-5 Units Subcutaneous QHS    insulin glargine-yfgn  8 Units Subcutaneous QHS   levothyroxine  150 mcg Oral Daily   lisinopril  2.5 mg Oral Daily   pantoprazole  80 mg Oral Daily   pyridOXINE  100 mg Oral Daily   sodium chloride flush  3 mL Intravenous Q12H   Continuous Infusions:   LOS: 0 days    Time spent: 35 minutes    Tresa Moore, MD [facility] Pager [phone]  If 7PM-7AM, please contact night-coverage 10/11/2020, 2:34 PM

## 2020-10-12 ENCOUNTER — Encounter
Admission: EM | Disposition: A | Discharge status: Home Health | Payer: Self-pay | Source: Home / Self Care | Attending: Internal Medicine

## 2020-10-12 ENCOUNTER — Encounter: Payer: Self-pay | Admitting: Internal Medicine

## 2020-10-12 ENCOUNTER — Observation Stay: Payer: Medicare HMO

## 2020-10-12 DIAGNOSIS — E440 Moderate protein-calorie malnutrition: Secondary | ICD-10-CM | POA: Diagnosis present

## 2020-10-12 DIAGNOSIS — Z9841 Cataract extraction status, right eye: Secondary | ICD-10-CM | POA: Diagnosis not present

## 2020-10-12 DIAGNOSIS — K219 Gastro-esophageal reflux disease without esophagitis: Secondary | ICD-10-CM | POA: Diagnosis present

## 2020-10-12 DIAGNOSIS — E871 Hypo-osmolality and hyponatremia: Secondary | ICD-10-CM | POA: Diagnosis not present

## 2020-10-12 DIAGNOSIS — E1122 Type 2 diabetes mellitus with diabetic chronic kidney disease: Secondary | ICD-10-CM | POA: Diagnosis present

## 2020-10-12 DIAGNOSIS — J9611 Chronic respiratory failure with hypoxia: Secondary | ICD-10-CM | POA: Diagnosis present

## 2020-10-12 DIAGNOSIS — Z79890 Hormone replacement therapy (postmenopausal): Secondary | ICD-10-CM | POA: Diagnosis not present

## 2020-10-12 DIAGNOSIS — R001 Bradycardia, unspecified: Secondary | ICD-10-CM | POA: Diagnosis not present

## 2020-10-12 DIAGNOSIS — R110 Nausea: Secondary | ICD-10-CM

## 2020-10-12 DIAGNOSIS — F32A Depression, unspecified: Secondary | ICD-10-CM | POA: Diagnosis present

## 2020-10-12 DIAGNOSIS — N179 Acute kidney failure, unspecified: Secondary | ICD-10-CM | POA: Diagnosis not present

## 2020-10-12 DIAGNOSIS — M961 Postlaminectomy syndrome, not elsewhere classified: Secondary | ICD-10-CM | POA: Diagnosis present

## 2020-10-12 DIAGNOSIS — Z79899 Other long term (current) drug therapy: Secondary | ICD-10-CM | POA: Diagnosis not present

## 2020-10-12 DIAGNOSIS — R55 Syncope and collapse: Secondary | ICD-10-CM | POA: Diagnosis present

## 2020-10-12 DIAGNOSIS — Z950 Presence of cardiac pacemaker: Secondary | ICD-10-CM | POA: Diagnosis not present

## 2020-10-12 DIAGNOSIS — Z9842 Cataract extraction status, left eye: Secondary | ICD-10-CM | POA: Diagnosis not present

## 2020-10-12 DIAGNOSIS — I442 Atrioventricular block, complete: Secondary | ICD-10-CM | POA: Diagnosis present

## 2020-10-12 DIAGNOSIS — E1142 Type 2 diabetes mellitus with diabetic polyneuropathy: Secondary | ICD-10-CM | POA: Diagnosis present

## 2020-10-12 DIAGNOSIS — F419 Anxiety disorder, unspecified: Secondary | ICD-10-CM | POA: Diagnosis present

## 2020-10-12 DIAGNOSIS — E538 Deficiency of other specified B group vitamins: Secondary | ICD-10-CM | POA: Diagnosis present

## 2020-10-12 DIAGNOSIS — I951 Orthostatic hypotension: Secondary | ICD-10-CM | POA: Diagnosis not present

## 2020-10-12 DIAGNOSIS — E1165 Type 2 diabetes mellitus with hyperglycemia: Secondary | ICD-10-CM | POA: Diagnosis present

## 2020-10-12 DIAGNOSIS — N1831 Chronic kidney disease, stage 3a: Secondary | ICD-10-CM | POA: Diagnosis present

## 2020-10-12 DIAGNOSIS — I5033 Acute on chronic diastolic (congestive) heart failure: Secondary | ICD-10-CM | POA: Diagnosis present

## 2020-10-12 DIAGNOSIS — Z6824 Body mass index (BMI) 24.0-24.9, adult: Secondary | ICD-10-CM | POA: Diagnosis not present

## 2020-10-12 DIAGNOSIS — I130 Hypertensive heart and chronic kidney disease with heart failure and stage 1 through stage 4 chronic kidney disease, or unspecified chronic kidney disease: Secondary | ICD-10-CM | POA: Diagnosis present

## 2020-10-12 DIAGNOSIS — Z20822 Contact with and (suspected) exposure to covid-19: Secondary | ICD-10-CM | POA: Diagnosis present

## 2020-10-12 DIAGNOSIS — N170 Acute kidney failure with tubular necrosis: Secondary | ICD-10-CM | POA: Diagnosis present

## 2020-10-12 DIAGNOSIS — E785 Hyperlipidemia, unspecified: Secondary | ICD-10-CM | POA: Diagnosis present

## 2020-10-12 HISTORY — PX: PACEMAKER IMPLANT: EP1218

## 2020-10-12 LAB — ALBUMIN: Albumin: 2.7 g/dL — ABNORMAL LOW (ref 3.5–5.0)

## 2020-10-12 LAB — BASIC METABOLIC PANEL
Anion gap: 9 (ref 5–15)
Anion gap: 8 (ref 5–15)
BUN: 30 mg/dL — ABNORMAL HIGH (ref 8–23)
BUN: 37 mg/dL — ABNORMAL HIGH (ref 8–23)
CO2: 23 mmol/L (ref 22–32)
CO2: 25 mmol/L (ref 22–32)
Calcium: 7.9 mg/dL — ABNORMAL LOW (ref 8.9–10.3)
Calcium: 7.6 mg/dL — ABNORMAL LOW (ref 8.9–10.3)
Chloride: 101 mmol/L (ref 98–111)
Chloride: 101 mmol/L (ref 98–111)
Creatinine, Ser: 1.17 mg/dL — ABNORMAL HIGH (ref 0.44–1.00)
Creatinine, Ser: 1.5 mg/dL — ABNORMAL HIGH (ref 0.44–1.00)
GFR, Estimated: 44 mL/min — ABNORMAL LOW (ref >60)
GFR, Estimated: 33 mL/min — ABNORMAL LOW (ref >60)
Glucose, Bld: 475 mg/dL — ABNORMAL HIGH (ref 70–99)
Glucose, Bld: 227 mg/dL — ABNORMAL HIGH (ref 70–99)
Potassium: 5.7 mmol/L — ABNORMAL HIGH (ref 3.5–5.1)
Potassium: 4.7 mmol/L (ref 3.5–5.1)
Sodium: 133 mmol/L — ABNORMAL LOW (ref 135–145)
Sodium: 134 mmol/L — ABNORMAL LOW (ref 135–145)

## 2020-10-12 LAB — GLUCOSE, CAPILLARY
Glucose-Capillary: 403 mg/dL — ABNORMAL HIGH (ref 70–99)
Glucose-Capillary: 311 mg/dL — ABNORMAL HIGH (ref 70–99)
Glucose-Capillary: 182 mg/dL — ABNORMAL HIGH (ref 70–99)

## 2020-10-12 LAB — CBC
HCT: 39.3 % (ref 36.0–46.0)
Hemoglobin: 12.9 g/dL (ref 12.0–15.0)
MCH: 29.9 pg (ref 26.0–34.0)
MCHC: 32.8 g/dL (ref 30.0–36.0)
MCV: 91 fL (ref 80.0–100.0)
Platelets: 172 K/uL (ref 150–400)
RBC: 4.32 MIL/uL (ref 3.87–5.11)
RDW: 12.9 % (ref 11.5–15.5)
WBC: 11.8 K/uL — ABNORMAL HIGH (ref 4.0–10.5)
nRBC: 0 % (ref 0.0–0.2)

## 2020-10-12 LAB — MAGNESIUM: Magnesium: 1.9 mg/dL (ref 1.7–2.4)

## 2020-10-12 SURGERY — PACEMAKER IMPLANT
Anesthesia: Moderate Sedation

## 2020-10-12 MED ORDER — SODIUM CHLORIDE 0.9 % IV SOLN: INTRAVENOUS | Status: DC | PRN

## 2020-10-12 MED ORDER — ONDANSETRON HCL 4 MG/2ML IJ SOLN
INTRAMUSCULAR | Status: DC | PRN
  Administered 2020-10-12: 4 mg via INTRAVENOUS

## 2020-10-12 MED ORDER — ADULT MULTIVITAMIN W/MINERALS CH
1.0000 | ORAL_TABLET | Freq: Every day | ORAL | Status: DC
  Administered 2020-10-12 – 2020-10-17 (×6): 1 via ORAL
  Filled 2020-10-12 (×6): qty 1

## 2020-10-12 MED ORDER — INSULIN ASPART 100 UNIT/ML IJ SOLN
4.0000 [IU] | Freq: Three times a day (TID) | INTRAMUSCULAR | Status: DC
  Administered 2020-10-14 – 2020-10-17 (×10): 4 [IU] via SUBCUTANEOUS
  Filled 2020-10-12 (×10): qty 1

## 2020-10-12 MED ORDER — SODIUM CHLORIDE 0.9 % IV SOLN
80.0000 mg | INTRAVENOUS | Status: DC
  Administered 2020-10-12: 80 mg
  Filled 2020-10-12: qty 80

## 2020-10-12 MED ORDER — INSULIN ASPART 100 UNIT/ML IJ SOLN
0.0000 [IU] | Freq: Three times a day (TID) | INTRAMUSCULAR | Status: DC
  Administered 2020-10-12: 3 [IU] via SUBCUTANEOUS
  Administered 2020-10-13: 5 [IU] via SUBCUTANEOUS
  Administered 2020-10-13 (×2): 3 [IU] via SUBCUTANEOUS
  Administered 2020-10-13: 2 [IU] via SUBCUTANEOUS
  Administered 2020-10-14: 3 [IU] via SUBCUTANEOUS
  Administered 2020-10-14: 11 [IU] via SUBCUTANEOUS
  Administered 2020-10-14: 22:00:00 3 [IU] via SUBCUTANEOUS
  Administered 2020-10-14 – 2020-10-15 (×2): 8 [IU] via SUBCUTANEOUS
  Administered 2020-10-15: 18:00:00 5 [IU] via SUBCUTANEOUS
  Administered 2020-10-15: 13:00:00 3 [IU] via SUBCUTANEOUS
  Administered 2020-10-16: 2 [IU] via SUBCUTANEOUS
  Administered 2020-10-16: 22:00:00 8 [IU] via SUBCUTANEOUS
  Administered 2020-10-16: 5 [IU] via SUBCUTANEOUS
  Administered 2020-10-16 – 2020-10-17 (×3): 8 [IU] via SUBCUTANEOUS
  Filled 2020-10-12 (×18): qty 1

## 2020-10-12 MED ORDER — INSULIN GLARGINE-YFGN 100 UNIT/ML ~~LOC~~ SOLN
12.0000 [IU] | Freq: Every day | SUBCUTANEOUS | Status: DC
  Administered 2020-10-12 – 2020-10-16 (×5): 12 [IU] via SUBCUTANEOUS
  Filled 2020-10-12 (×6): qty 0.12

## 2020-10-12 MED ORDER — HEPARIN (PORCINE) IN NACL 1000-0.9 UT/500ML-% IV SOLN
INTRAVENOUS | Status: DC | PRN
  Administered 2020-10-12: 500 mL

## 2020-10-12 MED ORDER — LIDOCAINE HCL 1 % IJ SOLN
INTRAMUSCULAR | Status: AC
  Filled 2020-10-12: qty 40

## 2020-10-12 MED ORDER — LACTATED RINGERS IV SOLN: INTRAVENOUS | Status: DC

## 2020-10-12 MED ORDER — SODIUM CHLORIDE 0.9 % IV SOLN: INTRAVENOUS | Status: DC

## 2020-10-12 MED ORDER — CEFAZOLIN SODIUM-DEXTROSE 2-4 GM/100ML-% IV SOLN
2.0000 g | INTRAVENOUS | Status: DC
  Filled 2020-10-12: qty 100

## 2020-10-12 MED ORDER — CEFAZOLIN SODIUM-DEXTROSE 1-4 GM/50ML-% IV SOLN
INTRAVENOUS | Status: DC | PRN
  Administered 2020-10-12: 2 g via INTRAVENOUS

## 2020-10-12 MED ORDER — SODIUM CHLORIDE 0.9 % IV BOLUS
500.0000 mL | Freq: Once | INTRAVENOUS | Status: AC
  Administered 2020-10-12: 500 mL via INTRAVENOUS

## 2020-10-12 MED ORDER — LACTATED RINGERS IV BOLUS
500.0000 mL | Freq: Once | INTRAVENOUS | Status: AC
  Administered 2020-10-12: 500 mL via INTRAVENOUS

## 2020-10-12 MED ORDER — METOCLOPRAMIDE HCL 5 MG/ML IJ SOLN
10.0000 mg | Freq: Four times a day (QID) | INTRAMUSCULAR | Status: DC | PRN
  Administered 2020-10-12: 10 mg via INTRAVENOUS
  Filled 2020-10-12: qty 2

## 2020-10-12 MED ORDER — ONDANSETRON HCL 4 MG/2ML IJ SOLN
INTRAMUSCULAR | Status: AC
  Filled 2020-10-12: qty 2

## 2020-10-12 MED ORDER — LABETALOL HCL 5 MG/ML IV SOLN
20.0000 mg | INTRAVENOUS | Status: DC | PRN
  Administered 2020-10-12: 20 mg via INTRAVENOUS
  Filled 2020-10-12: qty 4

## 2020-10-12 MED ORDER — SODIUM CHLORIDE 0.9 % IV SOLN
12.5000 mg | Freq: Four times a day (QID) | INTRAVENOUS | Status: DC | PRN
  Administered 2020-10-12: 12.5 mg via INTRAVENOUS
  Filled 2020-10-12: qty 12.5

## 2020-10-12 MED ORDER — SODIUM ZIRCONIUM CYCLOSILICATE 5 G PO PACK
10.0000 g | PACK | Freq: Once | ORAL | Status: AC
  Administered 2020-10-12: 10 g via ORAL
  Filled 2020-10-12: qty 2

## 2020-10-12 MED ORDER — ENSURE MAX PROTEIN PO LIQD
11.0000 [oz_av] | Freq: Every day | ORAL | Status: DC
  Administered 2020-10-14 – 2020-10-16 (×2): 11 [oz_av] via ORAL
  Filled 2020-10-12: qty 330

## 2020-10-12 SURGICAL SUPPLY — 14 items
CABLE SURG 12 DISP A/V CHANNEL (MISCELLANEOUS) ×2 IMPLANT
COVER PROBE U/S 5X48 (MISCELLANEOUS) ×2 IMPLANT
COVER SURGICAL LIGHT HANDLE (MISCELLANEOUS) ×2 IMPLANT
DEVICE DSSCT PLSMBLD 3.0S LGHT (MISCELLANEOUS) ×1 IMPLANT
LEAD TENDRIL MRI 46CM LPA1200M (Lead) ×2 IMPLANT
LEAD TENDRIL MRI 52CM LPA1200M (Lead) ×2 IMPLANT
PACEMAKER ASSURITY DR-RF (Pacemaker) ×2 IMPLANT
PAD ELECT DEFIB RADIOL ZOLL (MISCELLANEOUS) ×2 IMPLANT
PLASMABLADE 3.0S W/LIGHT (MISCELLANEOUS) ×2
SHEATH 8FR PRELUDE SNAP 13 (SHEATH) ×4 IMPLANT
SLING ARM IMMOBILIZER MED (SOFTGOODS) ×2 IMPLANT
SUT VIC AB 2-0 CT1 27 (SUTURE) ×2
SUT VIC AB 2-0 CT1 TAPERPNT 27 (SUTURE) ×1 IMPLANT
TRAY PACEMAKER INSERTION (PACKS) ×2 IMPLANT

## 2020-10-12 NOTE — Progress Notes (Signed)
Upper Left Chest

## 2020-10-12 NOTE — Progress Notes (Signed)
Initial Nutrition Assessment  DOCUMENTATION CODES:  Non-severe (moderate) malnutrition in context of chronic illness  INTERVENTION:  Continue Glucerna shakes TID.  Add Ensure Max po daily, each supplement provides 150 kcal and 30 grams of protein.  Add MVI with minerals daily.  Encourage PO intake.  NUTRITION DIAGNOSIS:  Moderate Malnutrition related to chronic illness (uncontrolled T2DM) as evidenced by moderate fat depletion, moderate muscle depletion.  GOAL:  Patient will meet greater than or equal to 90% of their needs  MONITOR:  PO intake, Supplement acceptance, Labs, Weight trends, I & O's  REASON FOR ASSESSMENT:  Consult Assessment of nutrition requirement/status  ASSESSMENT:  87 yo female with a PMH of  insulin-dependent diabetes mellitus with peripheral neuropathy, unintentional 30 pound weight loss throughout the COVID pandemic, HTN, orthostatic hypotension, HLD, hypothyroidism, and GERD who is being seen today for the evaluation of presyncope. 9/27 - temporary pacemaker placement  Pt asleep during RD visit. Did not awaken to touch or voice.  Per Epic, pt has lost ~6 lbs (4.4%) in the last 2 weeks, which is significant and severe for the time frame.  Recommend continuing Glucerna shakes TID, and adding Ensure Max daily and MVI with minerals.  Supplements: Glucerna shakes TID  Medications: reviewed; colace BID, ferrous sulfate, SSI, Semglee, Synthroid, Protonix, Vitamin B6, Reglan QID PRN (given once today), Zofran PRN (given twice today)  Labs: reviewed; Na 133 (L), K 5.7 (H), CBG 216-403 (H) HbA1c: 9.4% (09/21/2020)  NUTRITION - FOCUSED PHYSICAL EXAM: Flowsheet Row Most Recent Value  Orbital Region Moderate depletion  Upper Arm Region Moderate depletion  Thoracic and Lumbar Region Mild depletion  Buccal Region Moderate depletion  Temple Region Mild depletion  Clavicle Bone Region Severe depletion  Clavicle and Acromion Bone Region Severe depletion   Scapular Bone Region Unable to assess  Dorsal Hand Moderate depletion  Patellar Region Moderate depletion  Anterior Thigh Region Moderate depletion  Posterior Calf Region Moderate depletion  Edema (RD Assessment) None  Hair Reviewed  Eyes Unable to assess  Mouth Unable to assess  Skin Reviewed  Nails Reviewed   Diet Order:   Diet Order             Diet Carb Modified Fluid consistency: Thin; Room service appropriate? Yes  Diet effective now                  EDUCATION NEEDS:  No education needs have been identified at this time  Skin:  Skin Assessment: Reviewed RN Assessment  Last BM:  10/11/20  Height:  Ht Readings from Last 1 Encounters:  10/11/20 5\' 2"  (1.575 m)   Weight:  Wt Readings from Last 1 Encounters:  10/11/20 59.7 kg   BMI:  Body mass index is 24.07 kg/m.  Estimated Nutritional Needs:  Kcal:  1600-1800 Protein:  65-80 grams Fluid:  >1.6 L  10/13/20, RD, LDN (she/her/hers) Registered Dietitian I After-Hours/Weekend Pager # in [place]

## 2020-10-12 NOTE — Progress Notes (Signed)
Order placed for 2-view CXR to be obtained 10/13/2020 at 0600 per EP recommendations.

## 2020-10-12 NOTE — Progress Notes (Addendum)
Progress Note    Amy Calhoun  ZOX:[id number] DOB: [birth year]  DOA: 10/10/2020 PCP: Allegra Grana, FNP      Brief Narrative:    Medical records reviewed and are as summarized below:  Amy Calhoun is a 87 y.o. female with medical history significant for insulin-dependent diabetes mellitus, hypertension, hyperlipidemia, hypothyroidism, GERD, who presented to the hospital because of syncope.  She was found to have intermittent high-grade AV block with up to 6-second pause on telemetry.  She underwent left heart cath with placement of transvenous pacemaker on 10/11/2020.  This was followed by placement of a permanent pacemaker on 10/12/2020.    Assessment/Plan:   Principal Problem:   Syncope Active Problems:   Esophageal dysmotility   Hyperlipidemia   Hypothyroidism   HTN (hypertension), malignant   CKD (chronic kidney disease) stage 3, GFR 30-59 ml/min (HCC)   Gastroesophageal reflux disease without esophagitis   Fatigue   Depression   Weakness of both lower extremities   Type 2 diabetes mellitus with hyperglycemia, with long-term current use of insulin (HCC)   B12 deficiency   Abnormal gait   Symptomatic bradycardia   Heart block AV complete (HCC)   Malnutrition of moderate degree   Body mass index is 24.07 kg/m.   Complete heart block, s/p syncope: S/p placement of transvenous pacemaker on 10/11/2020 and placement of permanent pacemaker on 10/12/2020. Follow up with cardiologist.   Insulin-dependent diabetes mellitus with hyperglycemia: Increase insulin glargine from 8 units to 12 units nightly.  Add NovoLog 4 units 3 times daily.  Continue NovoLog as needed for hyperglycemia. Hemoglobin A1c was 9.4 on 09/21/2020.  Hypotension: Blood pressure dropped to 81/38.  Give Ringer's lactate 500 mL bolus followed by Ringer's lactate infusion at 75 mL/h.  Monitor BP closely.  Hold lisinopril.  Hyperkalemia: Treat with Lokelma.  Monitor BMP.  Hold  lisinopril  Hyperglycemia induced hyponatremia: Treat with IV fluids.  Chronic diastolic dysfunction: 2D echo showed grade 1 diastolic dysfunction and EF estimated at 60 to 65%.   Diet Order             Diet Carb Modified Fluid consistency: Thin; Room service appropriate? Yes  Diet effective now                      Consultants: Cardiologist  Procedures: S/p transvenous pacemaker and subsequent permanent pacemaker placement    Medications:    amitriptyline  150 mg Oral QHS   Chlorhexidine Gluconate Cloth  6 each Topical Q0600   docusate sodium  100 mg Oral BID   ezetimibe  10 mg Oral Daily   feeding supplement (GLUCERNA SHAKE)  237 mL Oral TID BM   ferrous sulfate  325 mg Oral Daily   gabapentin  200 mg Oral BID   hydrocerin   Topical BID   insulin aspart  0-15 Units Subcutaneous TID WC   insulin aspart  0-5 Units Subcutaneous QHS   insulin aspart  4 Units Subcutaneous TID WC   insulin glargine-yfgn  12 Units Subcutaneous QHS   levothyroxine  150 mcg Oral Daily   multivitamin with minerals  1 tablet Oral Daily   pantoprazole  80 mg Oral Daily   Ensure Max Protein  11 oz Oral Daily   pyridOXINE  100 mg Oral Daily   sodium chloride flush  3 mL Intravenous Q12H   sodium chloride flush  3 mL Intravenous Q12H   sodium zirconium cyclosilicate  10 g Oral  Once   Continuous Infusions:  sodium chloride     sodium chloride 20 mL/hr at 10/11/20 1931   lactated ringers     lactated ringers     promethazine (PHENERGAN) injection (IM or IVPB) Stopped (10/12/20 0446)     Anti-infectives (From admission, onward)    Start     Dose/Rate Route Frequency Ordered Stop   10/12/20 0908  ceFAZolin 1 g / gentamicin 80 mg in NS 500 mL surgical irrigation  Status:  Discontinued          As needed 10/12/20 0908 10/12/20 1024   10/12/20 0845  gentamicin (GARAMYCIN) 80 mg in sodium chloride 0.9 % 500 mL irrigation  Status:  Discontinued        80 mg Irrigation On call 10/12/20  0756 10/12/20 1024   10/12/20 0845  ceFAZolin (ANCEF) IVPB 2g/100 mL premix  Status:  Discontinued        2 g 200 mL/hr over 30 Minutes Intravenous On call 10/12/20 0756 10/12/20 1024   10/12/20 0805  ceFAZolin (ANCEF) IVPB 1 g/50 mL premix  Status:  Discontinued        over 30 Minutes  Continuous PRN 10/12/20 0805 10/12/20 1024              Family Communication/Anticipated D/C date and plan/Code Status   DVT prophylaxis: SCD's Start: 10/11/20 1839 Place TED hose Start: 10/10/20 1904     Code Status: Full Code  Family Communication: None Disposition Plan:    Status is: Inpatient  Remains inpatient appropriate because:IV treatments appropriate due to intensity of illness or inability to take PO  Dispo: The patient is from: Home              Anticipated d/c is to: Home              Patient currently is not medically stable to d/c.   Difficult to place patient No           Subjective:   Interval events noted.  No chest pain, shortness of breath or dizziness.  Objective:    Vitals:   10/12/20 1123 10/12/20 1130 10/12/20 1145 10/12/20 1200  BP: (!) 152/67 (!) 149/69 (!) 139/58 (!) 125/53  Pulse:  79 78 78  Resp:  (!) Temp:    98.3 F (36.8 C)  TempSrc:    Oral  SpO2:  98% 98% 98%  Weight:      Height:       No data found.   Intake/Output Summary (Last 24 hours) at 10/12/2020 1628 Last data filed at 10/12/2020 1200 Gross per 24 hour  Intake 192.81 ml  Output --  Net 192.81 ml   Filed Weights   10/10/20 1620 10/11/20 1810  Weight: 55 kg 59.7 kg    Exam:  GEN: NAD SKIN: Warm and dry.  Dressing on pacemaker left upper chest is clean, dry and intact. EYES: EOMI ENT: MMM CV: RRR PULM: CTA B ABD: soft, ND, NT, +BS CNS: AAO x 3, non focal EXT: No edema or tenderness        Data Reviewed:   I have personally reviewed following labs and imaging studies:  Labs: Labs show the following:   Basic Metabolic Panel: Recent  Labs  Lab 10/10/20 1624 10/11/20 0626 10/12/20 1116  NA 135 137 133*  K 4.5 4.2 5.7*  CL 100 100 101  CO2 GLUCOSE 360* 226* 475*  BUN 37* 26* 30*  CREATININE 1.12* 0.74 1.17*  CALCIUM 8.5* 8.4* 7.9*  MG 1.9  --   --    GFR Estimated Creatinine Clearance: 25.3 mL/min (A) (by C-G formula based on SCr of 1.17 mg/dL (H)). Liver Function Tests: Recent Labs  Lab 10/10/20 1624  AST 21  ALT 17  ALKPHOS 65  BILITOT 0.5  PROT 6.9  ALBUMIN 3.9   No results for input(s): LIPASE, AMYLASE in the last 168 hours. No results for input(s): AMMONIA in the last 168 hours. Coagulation profile No results for input(s): INR, PROTIME in the last 168 hours.  CBC: Recent Labs  Lab 10/10/20 1624 10/11/20 0626 10/12/20 1116  WBC 8.6 8.1 11.8*  NEUTROABS 4.9  --   --   HGB 12.0 12.1 12.9  HCT 36.4 35.2* 39.3  MCV 92.4 91.4 91.0  PLT 184 163 172   Cardiac Enzymes: No results for input(s): CKTOTAL, CKMB, CKMBINDEX, TROPONINI in the last 168 hours. BNP (last 3 results) No results for input(s): PROBNP in the last 8760 hours. CBG: Recent Labs  Lab 10/11/20 0909 10/11/20 1148 10/11/20 1815 10/11/20 2123 10/12/20 1109  GLUCAP 216* 264* 263* 326* 403*   D-Dimer: Recent Labs    10/10/20 1624  DDIMER 0.70*   Hgb A1c: No results for input(s): HGBA1C in the last 72 hours. Lipid Profile: No results for input(s): CHOL, HDL, LDLCALC, TRIG, CHOLHDL, LDLDIRECT in the last 72 hours. Thyroid function studies: Recent Labs    10/10/20 1624  TSH 3.281   Anemia work up: Recent Labs    10/10/20 1924  VITAMINB12 912   Sepsis Labs: Recent Labs  Lab 10/10/20 1624 10/10/20 1924 10/11/20 0626 10/12/20 1116  PROCALCITON  --  <0.10  --   --   WBC 8.6  --  8.1 11.8*    Microbiology Recent Results (from the past 240 hour(s))  Resp Panel by RT-PCR (Flu A&B, Covid) Nasopharyngeal Swab     Status: None   Collection Time: 10/10/20  4:24 PM   Specimen: Nasopharyngeal Swab;  Nasopharyngeal(NP) swabs in vial transport medium  Result Value Ref Range Status   SARS Coronavirus 2 by RT PCR NEGATIVE NEGATIVE Final    Comment: (NOTE) SARS-CoV-2 target nucleic acids are NOT DETECTED.  The SARS-CoV-2 RNA is generally detectable in upper respiratory specimens during the acute phase of infection. The lowest concentration of SARS-CoV-2 viral copies this assay can detect is 138 copies/mL. A negative result does not preclude SARS-Cov-2 infection and should not be used as the sole basis for treatment or other patient management decisions. A negative result may occur with  improper specimen collection/handling, submission of specimen other than nasopharyngeal swab, presence of viral mutation(s) within the areas targeted by this assay, and inadequate number of viral copies(<138 copies/mL). A negative result must be combined with clinical observations, patient history, and epidemiological information. The expected result is Negative.  Fact Sheet for Patients:  BloggerCourse.com  Fact Sheet for Healthcare Providers:  SeriousBroker.it  This test is no t yet approved or cleared by the [place] FDA and  has been authorized for detection and/or diagnosis of SARS-CoV-2 by FDA under an Emergency Use Authorization (EUA). This EUA will remain  in effect (meaning this test can be used) for the duration of the COVID-19 declaration under Section 564(b)(1) of the Act, 21 U.S.C.section 360bbb-3(b)(1), unless the authorization is terminated  or revoked sooner.       Influenza A by PCR NEGATIVE NEGATIVE Final   Influenza B by PCR NEGATIVE NEGATIVE  Final    Comment: (NOTE) The Xpert Xpress SARS-CoV-2/FLU/RSV plus assay is intended as an aid in the diagnosis of influenza from Nasopharyngeal swab specimens and should not be used as a sole basis for treatment. Nasal washings and aspirates are unacceptable for Xpert Xpress  SARS-CoV-2/FLU/RSV testing.  Fact Sheet for Patients: BloggerCourse.com  Fact Sheet for Healthcare Providers: SeriousBroker.it  This test is not yet approved or cleared by the [place] FDA and has been authorized for detection and/or diagnosis of SARS-CoV-2 by FDA under an Emergency Use Authorization (EUA). This EUA will remain in effect (meaning this test can be used) for the duration of the COVID-19 declaration under Section 564(b)(1) of the Act, 21 U.S.C. section 360bbb-3(b)(1), unless the authorization is terminated or revoked.  Performed at [facility], [address] [place], [place] [address]   MRSA Next Gen by PCR, Nasal     Status: None   Collection Time: 10/11/20  6:10 PM   Specimen: Nasal Mucosa; Nasal Swab  Result Value Ref Range Status   MRSA by PCR Next Gen NOT DETECTED NOT DETECTED Final    Comment: (NOTE) The GeneXpert MRSA Assay (FDA approved for NASAL specimens only), is one component of a comprehensive MRSA colonization surveillance program. It is not intended to diagnose MRSA infection nor to guide or monitor treatment for MRSA infections. Test performance is not FDA approved in patients less than 52 years old. Performed at [facility], [address] [place], [place] [address]     Procedures and diagnostic studies:  DG Chest 2 View  Result Date: 10/12/2020 CLINICAL DATA:  Evaluate pacemaker EXAM: CHEST - 2 VIEW COMPARISON:  Chest x-ray dated October 11, 2020 FINDINGS: Interval placement of left chest wall dual lead pacemaker with leads overlying the expected area of the right atrium and right ventricle. Cardiac and mediastinal contours are unchanged and within normal limits. Decreased bilateral heterogeneous opacities. Lateral view is limited due to arms down position. No large pleural effusion or evidence of pneumothorax. IMPRESSION: Interval placement of left chest wall dual  lead pacemaker. No evidence of pleural effusion or pneumothorax. Decreased bilateral heterogeneous pulmonary opacities, likely due to improved pulmonary edema. Electronically Signed   By: Allegra Lai M.D.   On: 10/12/2020 11:31   CT HEAD WO CONTRAST ( )  Result Date: 10/10/2020 CLINICAL DATA:  Dizziness. EXAM: CT HEAD WITHOUT CONTRAST TECHNIQUE: Contiguous axial images were obtained from the base of the skull through the vertex without intravenous contrast. COMPARISON:  CT head 03/01/2016 BRAIN: BRAIN Cerebral ventricle sizes are concordant with the degree of cerebral volume loss. Patchy and confluent areas of decreased attenuation are noted throughout the deep and periventricular white matter of the cerebral hemispheres bilaterally, compatible with chronic microvascular ischemic disease. No evidence of large-territorial acute infarction. No parenchymal hemorrhage. No mass lesion. No extra-axial collection. No mass effect or midline shift. No hydrocephalus. Basilar cisterns are patent. Vascular: No hyperdense vessel. Atherosclerotic calcifications are present within the cavernous internal carotid and vertebral arteries. Skull: No acute fracture or focal lesion. Sinuses/Orbits: Paranasal sinuses and mastoid air cells are clear. The orbits are unremarkable. Other: None. IMPRESSION: Negative for acute traumatic injury. Electronically Signed   By: Tish Frederickson M.D.   On: 10/10/2020 17:37   CT Angio Chest Pulmonary Embolism (PE) W or WO Contrast  Result Date: 10/11/2020 CLINICAL DATA:  Presyncope, hyperlipidemia, hypertension, diabetes EXAM: CT ANGIOGRAPHY CHEST WITH CONTRAST TECHNIQUE: Multidetector CT imaging of the chest was performed using the standard protocol  during bolus administration of intravenous contrast. Multiplanar CT image reconstructions and MIPs were obtained to evaluate the vascular anatomy. CONTRAST:  75mL OMNIPAQUE IOHEXOL 350 MG/ML SOLN COMPARISON:  10/10/2020 FINDINGS:  Cardiovascular: This is a technically adequate evaluation of the pulmonary vasculature. No filling defects or pulmonary emboli. The heart is unremarkable without pericardial effusion. Mild calcification of the mitral annulus. No evidence of thoracic aortic aneurysm or dissection. There is extensive atherosclerosis of the thoracic aorta and coronary vasculature. Mediastinum/Nodes: No enlarged mediastinal, hilar, or axillary lymph nodes. Thyroid gland, trachea, and esophagus demonstrate no significant findings. Lungs/Pleura: No acute airspace disease, effusion, or pneumothorax. Central airways are patent. Upper Abdomen: No acute abnormality. Musculoskeletal: No acute or destructive bony lesions. Reconstructed images demonstrate no additional findings. Review of the MIP images confirms the above findings. IMPRESSION: 1. No evidence of pulmonary embolus. 2. No acute intrathoracic process. 3. Aortic Atherosclerosis (ICD10-I70.0). Coronary artery atherosclerosis. Electronically Signed   By: Sharlet Salina M.D.   On: 10/11/2020 15:43   MR BRAIN WO CONTRAST  Result Date: 10/11/2020 CLINICAL DATA:  Transient ischemic attack EXAM: MRI HEAD WITHOUT CONTRAST TECHNIQUE: Multiplanar, multiecho pulse sequences of the brain and surrounding structures were obtained without intravenous contrast. COMPARISON:  None. FINDINGS: Brain: No acute infarct, mass effect or extra-axial collection. No acute or chronic hemorrhage. Hyperintense T2-weighted signal is moderately widespread throughout the white matter. Generalized volume loss without a clear lobar predilection. 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 The midline structures are normal. Vascular: Major flow voids are preserved. Skull and upper cervical spine: Normal calvarium and skull base. Visualized upper cervical spine and soft tissues are normal. Sinuses/Orbits:No paranasal sinus fluid levels or advanced mucosal thickening. No mastoid or middle ear  effusion. Normal orbits. IMPRESSION: 1. No acute intracranial abnormality. 2. Findings of chronic ischemic microangiopathy and generalized volume loss. Electronically Signed   By: Deatra Robinson M.D.   On: 10/11/2020 01:26   CARDIAC CATHETERIZATION  Result Date: 10/11/2020 Conclusions: Severe single-vessel coronary artery disease with diffuse disease involving relatively small RCA extending from the ostium through the mid/distal vessel of up to 90%.  Mild-moderate disease is also noted in the left coronary artery. Mildly elevated left ventricular filling pressure (LVEDP 15-20 mmHg). Successful placement of 60F temporary transvenous pacing wire via the right internal jugular vein. Recommendations: Recommend medical therapy of relatively small and diffusely diseased RCA, which already fills via faint left-right collaterals to the PDA. Secondary prevention of coronary artery disease. STAT portable chest radiograph when patient arrives in ICU for evaluation of temporary transvenous pacemaker placement. EP consultation with plans for permanent pacemaker tomorrow morning with Dr. Lalla Brothers. Yvonne Kendall, MD [facility]  DG Chest Port 1 View  Result Date: 10/11/2020 CLINICAL DATA:  Complete heart block, right IJ line EXAM: PORTABLE CHEST 1 VIEW COMPARISON:  10/10/2020 FINDINGS: Right IJ temporary pacemaker lead overlies the right ventricle. Elevation of the right hemidiaphragm. Increased interstitial changes. No significant pleural effusion. No pneumothorax. Normal heart size. IMPRESSION: Right IJ temporary pacemaker lead overlies right ventricle. No pneumothorax. Persistent elevation of right hemidiaphragm. Increased interstitial changes probably reflect edema. Electronically Signed   By: Guadlupe Spanish M.D.   On: 10/11/2020 19:32   DG Chest Portable 1 View  Result Date: 10/10/2020 CLINICAL DATA:  Weakness. Bradycardia. Blood sugar 355. Near syncope. EXAM: PORTABLE CHEST 1 VIEW COMPARISON:  None. FINDINGS:  The heart and mediastinal contours are unchanged. Aortic calcification. Similar-appearing left base atelectasis versus scarring. No focal consolidation. No pulmonary edema.  No pleural effusion. No pneumothorax. No acute osseous abnormality.  Old healed left rib fractures. IMPRESSION: No active disease. Electronically Signed   By: Tish Frederickson M.D.   On: 10/10/2020 17:31   EEG adult  Result Date: 10/11/2020 Charlsie Quest, MD     10/11/2020  1:39 PM Patient Name: Amy Calhoun MRN: [id number] Epilepsy Attending: Charlsie Quest Referring Physician/Provider: Dr Londell Moh Date: 10/11/2020 Duration: 20.38 mins Patient history: 87 year old female with syncope.  EEG to evaluate for seizures. Level of alertness: Awake AEDs during EEG study: None Technical aspects: This EEG study was done with scalp electrodes positioned according to the 10-20 International system of electrode placement. Electrical activity was acquired at a sampling rate of 500Hz  and reviewed with a high frequency filter of 70Hz  and a low frequency filter of 1Hz . EEG data were recorded continuously and digitally stored. Description: The posterior dominant rhythm consists of 8-9 Hz activity of moderate voltage (25-35 uV) seen predominantly in posterior head regions, symmetric and reactive to eye opening and eye closing.  Intermittent left temporal 2-3Hz  delta slowing was also noted.  Physiologic photic driving was not seen during photic stimulation.  Hyperventilation was not performed.   ABNORMALITY -Intermittent slow, left temporal region IMPRESSION: This study is suggestive of nonspecific cortical dysfunction in left temporal region.  No seizures or epileptiform discharges were seen throughout the recording.   ECHOCARDIOGRAM COMPLETE  Result Date: 10/11/2020    ECHOCARDIOGRAM REPORT   Patient Name:   Amy Calhoun Date of Exam: 10/11/2020 Medical Rec #:  Charlsie Quest         Height:       67.0 in Accession #:    10/13/2020         Weight:       121.3 lb Date of Birth:  July 29, 1928          BSA:          1.635 m Patient Age:    89 years          BP:           141/65 mmHg Patient Gender: F                 HR:           68 bpm. Exam Location:  [facility] Procedure: 2D Echo, Cardiac Doppler and Color Doppler Indications:     Syncope R55  History:         Patient has no prior history of Echocardiogram examinations.                  Risk Factors:Hypertension and Dyslipidemia.  Sonographer:     [id number] Referring Phys:  [id number] AMY N COX Diagnosing Phys: 11/16/1928 End MD  Sonographer Comments: Suboptimal apical window and no subcostal window. IMPRESSIONS  1. Left ventricular ejection fraction, by estimation, is 60 to 65%. The left ventricle has normal function. Left ventricular endocardial border not optimally defined to evaluate regional wall motion. There is mild left ventricular hypertrophy. Left ventricular diastolic parameters are consistent with Grade I diastolic dysfunction (impaired relaxation). Elevated left atrial pressure.  2. Right ventricular systolic function is normal. The right ventricular size is normal. Tricuspid regurgitation signal is inadequate for assessing PA pressure.  3. Left atrial size was mildly dilated.  4. The mitral valve is degenerative. Trivial mitral valve regurgitation. No evidence of mitral stenosis. Moderate to severe mitral annular calcification.  5. The aortic valve is tricuspid.  There is mild calcification of the aortic valve. There is mild thickening of the aortic valve. Aortic valve regurgitation is mild. Mild to moderate aortic valve sclerosis/calcification is present, without any evidence of aortic stenosis. FINDINGS  Left Ventricle: Left ventricular ejection fraction, by estimation, is 60 to 65%. The left ventricle has normal function. Left ventricular endocardial border not optimally defined to evaluate regional wall motion. The left ventricular internal cavity size was normal in size. There is mild  left ventricular hypertrophy. Left ventricular diastolic parameters are consistent with Grade I diastolic dysfunction (impaired relaxation). Elevated left atrial pressure. Right Ventricle: The right ventricular size is normal. No increase in right ventricular wall thickness. Right ventricular systolic function is normal. Tricuspid regurgitation signal is inadequate for assessing PA pressure. Left Atrium: Left atrial size was mildly dilated. Right Atrium: Right atrial size was normal in size. Pericardium: The pericardium was not well visualized. Mitral Valve: The mitral valve is degenerative in appearance. There is mild thickening of the mitral valve leaflet(s). There is mild calcification of the mitral valve leaflet(s). Moderate to severe mitral annular calcification. Trivial mitral valve regurgitation. No evidence of mitral valve stenosis. MV peak gradient, 11.3 mmHg. The mean mitral valve gradient is 4.0 mmHg. Tricuspid Valve: The tricuspid valve is normal in structure. Tricuspid valve regurgitation is trivial. Aortic Valve: The aortic valve is tricuspid. There is mild calcification of the aortic valve. There is mild thickening of the aortic valve. Aortic valve regurgitation is mild. Mild to moderate aortic valve sclerosis/calcification is present, without any evidence of aortic stenosis. Aortic valve mean gradient measures 4.5 mmHg. Aortic valve peak gradient measures 7.8 mmHg. Aortic valve area, by VTI measures 2.02 cm. Pulmonic Valve: The pulmonic valve was grossly normal. Pulmonic valve regurgitation is not visualized. No evidence of pulmonic stenosis. Aorta: The aortic root is normal in size and structure. Venous: The inferior vena cava was not well visualized. IAS/Shunts: The interatrial septum was not well visualized.  LEFT VENTRICLE PLAX 2D LVIDd:         3.70 cm  Diastology LVIDs:         2.10 cm  LV e' medial:    3.70 cm/s LV PW:         1.00 cm  LV E/e' medial:  28.6 LV IVS:        1.09 cm  LV e'  lateral:   4.90 cm/s LVOT diam:     1.95 cm  LV E/e' lateral: 21.6 LV SV:         60 LV SV Index:   37 LVOT Area:     2.99 cm  RIGHT VENTRICLE RV Basal diam:  2.20 cm RV S prime:     8.38 cm/s TAPSE (M-mode): 1.7 cm LEFT ATRIUM             Index       RIGHT ATRIUM          Index LA diam:        3.10 cm 1.90 cm/m  RA Area:     6.90 cm LA Vol (A2C):   68.7 ml 42.02 ml/m RA Volume:   10.00 ml 6.12 ml/m LA Vol (A4C):   36.1 ml 22.08 ml/m LA Biplane Vol: 54.9 ml 33.58 ml/m  AORTIC VALVE                    PULMONIC VALVE AV Area (Vmax):    1.68 cm     PV Vmax:  0.98 m/s AV Area (Vmean):   1.58 cm     PV Peak grad:   3.9 mmHg AV Area (VTI):     2.02 cm     RVOT Peak grad: 4 mmHg AV Vmax:           139.50 cm/s AV Vmean:          100.450 cm/s AV VTI:            0.298 m AV Peak Grad:      7.8 mmHg AV Mean Grad:      4.5 mmHg LVOT Vmax:         78.30 cm/s LVOT Vmean:        53.300 cm/s LVOT VTI:          0.202 m LVOT/AV VTI ratio: 0.68  AORTA Ao Root diam: 3.40 cm MITRAL VALVE MV Area (PHT): 2.26 cm     SHUNTS MV Area VTI:   1.53 cm     Systemic VTI:  0.20 m MV Peak grad:  11.3 mmHg    Systemic Diam: 1.95 cm MV Mean grad:  4.0 mmHg MV Vmax:       1.68 m/s MV Vmean:      97.1 cm/s MV Decel Time: 336 msec MV E velocity: 106.00 cm/s MV A velocity: 141.00 cm/s MV E/A ratio:  0.75 Cristal Deer End MD Electronically signed by Yvonne Kendall MD Signature Date/Time: 10/11/2020/12:37:28 PM    Final                LOS: 0 days   Stepen Prins  [facility]   Pager on [email or web address]. If 7PM-7AM, please contact night-coverage at [email or web address]     10/12/2020, 4:28 PM

## 2020-10-12 NOTE — Progress Notes (Signed)
Inpatient Diabetes Program Recommendations  AACE/ADA: New Consensus Statement on Inpatient Glycemic Control   Target Ranges:  Prepandial:   less than 140 mg/dL      Peak postprandial:   less than 180 mg/dL (1-2 hours)      Critically ill patients:  140 - 180 mg/dL   Results for Amy Calhoun, Amy Calhoun (MRN [id number]) as of 10/12/2020 11:34  Ref. Range 10/11/2020 05:48 10/11/2020 09:09 10/11/2020 11:48 10/11/2020 18:15 10/11/2020 21:23  Glucose-Capillary Latest Ref Range: 70 - 99 mg/dL 366 (H) 440 (H) 347 (H) 263 (H) 326 (H)    Review of Glycemic Control  Diabetes history: DM2 Outpatient Diabetes medications: Lantus 8 units QHS, Humalog 8 units TID with meals plus additional units for correction Current orders for Inpatient glycemic control: Semglee 8 units QHS, Novolog 0-15 units TID with meals, Novolog 0-5 units QHS   Inpatient Diabetes Program Recommendations:     Insulin: No fasting CBG in chart; noted patient went to cath lab this morning at RN reports current glucose 403 mg/dl and patient was given Novolog 15 units at 11:25.   Please consider increasing Semglee to 12 units QHS and adding Novolog 4 units TID with meals for meal coverage if patient eats at least 50% of meals (start meal coverage with supper since patient has been given Novolog 15 units at 11:25).  HbgA1C:  A1C 9.4% on 09/21/20 at last visit with Endocrinologist.    NOTE: Per chart, patient sees Dr. Lonzo Cloud (Endocrinologist) for DM management and was last seen 09/21/20. Per office visit note on 09/21/20, patient was to continue Lantus 8 units QHS, Humalog increased from 6 to 8 units TID with meals, and Novolog 0-6 units for correction.    Thanks, Orlando Penner, RN, MSN, CDE Diabetes Coordinator Inpatient Diabetes Program [phone] (Team Pager from 8am to 5pm)

## 2020-10-12 NOTE — Consult Note (Signed)
Electrophysiology Consultation:   Patient ID: Amy Calhoun MRN: [id number]; DOB: [birth year]  Admit date: 10/10/2020 Date of Consult: 10/12/2020  PCP:  Allegra Grana, FNP      Patient Profile:   Amy Calhoun is a 87 y.o. female with a hx of DM, HTN, HLD, hypothyroidism, GERD, weight loss who is being seen 10/12/2020 for the evaluation of syncope at the request of Dr End.  History of Present Illness:   Amy Calhoun presented to the hospital 10/11/2020 with multiple episodes of syncope in the setting of high degree AV block. She was seen yesterday by Dr End who noted symptomatic CHB and pauses of > 6 seconds. She went for [facility] and temporary transvenous pacemaker yesterday.   This morning she is awake and participatory in the history. She tells me that she has experienced multiple passing out episodes. Over night she has experienced nausea without vomiting. Her daughter is also present during the discussion and reports history of multiple esophageal dilations. She does not take nodal blockers.   Past Medical History:  Diagnosis Date   Allergy    Ambulates with cane    Anemia    At high risk for falls    Cataract    removed both eyes   Chronic kidney disease 08/07/2016   Chronic renal failure, stage III   Depression    Esophageal dysphagia    Gastric ulcer    GERD (gastroesophageal reflux disease)    Hyperlipidemia    Hypertension    Hypothyroidism    IDDM (insulin dependent diabetes mellitus)    Ischemic colitis (HCC)    Neuromuscular disorder (HCC)    neuropathy   Osteoarthritis    Ulcer of esophagus     Past Surgical History:  Procedure Laterality Date   ABDOMINAL HYSTERECTOMY     CATARACT EXTRACTION, BILATERAL     CHOLECYSTECTOMY     COLONOSCOPY  2010   ESOPHAGEAL DILATION  08/07/2016   usually a couple times a year, last time 07/30/16   GALLBLADDER SURGERY     LEFT HEART CATH AND CORONARY ANGIOGRAPHY N/A 10/11/2020   Procedure: LEFT HEART CATH AND  CORONARY ANGIOGRAPHY;  Surgeon: Yvonne Kendall, MD;  Location: [facility] INVASIVE CV LAB;  Service: Cardiovascular;  Laterality: N/A;   LUMBAR LAMINECTOMY/DECOMPRESSION MICRODISCECTOMY N/A 08/10/2016   Procedure: BILATERAL HEMILAMINECTOMY LUMBAR THREE-FOUR,LUMBAR FOUR-FIVE,LEFT LUMBAR FIVE-SACRAL ONE HEMILAMINECTOMY AND DECOMPRESSION;  Surgeon: Coletta Memos, MD;  Location: [facility] OR;  Service: Neurosurgery;  Laterality: N/A;   TEMPORARY PACEMAKER N/A 10/11/2020   Procedure: TEMPORARY PACEMAKER;  Surgeon: Yvonne Kendall, MD;  Location: [facility] INVASIVE CV LAB;  Service: Cardiovascular;  Laterality: N/A;   THYROIDECTOMY     UPPER GASTROINTESTINAL ENDOSCOPY         Inpatient Medications: Scheduled Meds:  amitriptyline  150 mg Oral QHS   Chlorhexidine Gluconate Cloth  6 each Topical Q0600   docusate sodium  100 mg Oral BID   ezetimibe  10 mg Oral Daily   feeding supplement (GLUCERNA SHAKE)  237 mL Oral TID BM   ferrous sulfate  325 mg Oral Daily   gabapentin  200 mg Oral BID   hydrocerin   Topical BID   insulin aspart  0-15 Units Subcutaneous TID WC   insulin aspart  0-5 Units Subcutaneous QHS   insulin glargine-yfgn  8 Units Subcutaneous QHS   levothyroxine  150 mcg Oral Daily   lisinopril  2.5 mg Oral Daily   pantoprazole  80 mg Oral Daily   pyridOXINE  100 mg Oral Daily   sodium chloride flush  3 mL Intravenous Q12H   sodium chloride flush  3 mL Intravenous Q12H   Continuous Infusions:  sodium chloride     sodium chloride 20 mL/hr at 10/11/20 1931   promethazine (PHENERGAN) injection (IM or IVPB) Stopped (10/12/20 0446)   PRN Meds: sodium chloride, acetaminophen, acetaminophen-codeine, albuterol, hydrALAZINE, labetalol, metoCLOPramide (REGLAN) injection, ondansetron (ZOFRAN) IV, promethazine (PHENERGAN) injection (IM or IVPB), sodium chloride flush  Allergies:    Allergies  Allergen Reactions   Pravachol [Pravastatin] Other (See Comments)    States she refused due to side effects     Social History:   Social History   Socioeconomic History   Marital status: Widowed    Spouse name: Not on file   Number of children: Not on file   Years of education: Not on file   Highest education level: Not on file  Occupational History   Occupation: retired  Tobacco Use   Smoking status: Never   Smokeless tobacco: Never  Vaping Use   Vaping Use: Never used  Substance and Sexual Activity   Alcohol use: No   Drug use: No   Sexual activity: Not Currently    Partners: Male  Other Topics Concern   Not on file  Social History Narrative   [facility] senior complex, independent living.    Provide her with cleaning, one meal per day.    Social Determinants of Health   Financial Resource Strain: Low Risk    Difficulty of Paying Living Expenses: Not hard at all  Food Insecurity: No Food Insecurity   Worried About Programme researcher, broadcasting/film/video in the Last Year: Never true   Ran Out of Food in the Last Year: Never true  Transportation Needs: No Transportation Needs   Lack of Transportation (Medical): No   Lack of Transportation (Non-Medical): No  Physical Activity: Sufficiently Active   Days of Exercise per Week: 3 days   Minutes of Exercise per Session: 50 min  Stress: No Stress Concern Present   Feeling of Stress : Not at all  Social Connections: Unknown   Frequency of Communication with Friends and Family: Not on file   Frequency of Social Gatherings with Friends and Family: More than three times a week   Attends Religious Services: Not on Scientist, clinical (histocompatibility and immunogenetics) or Organizations: Not on file   Attends Banker Meetings: Not on file   Marital Status: Not on file  Intimate Partner Violence: Not At Risk   Fear of Current or Ex-Partner: No   Emotionally Abused: No   Physically Abused: No   Sexually Abused: No    Family History:    Family History  Problem Relation Age of Onset   Diabetes Mother    Arthritis Mother    Hyperlipidemia Mother    Mental  illness Mother    Heart disease Father    Mental illness Sister    Arthritis Maternal Grandmother    Arthritis Maternal Grandfather    Colon cancer Neg Hx    Stomach cancer Neg Hx    Esophageal cancer Neg Hx    Rectal cancer Neg Hx    Colon polyps Neg Hx      ROS:  Please see the history of present illness.   All other ROS reviewed and negative.     Physical Exam/Data:   Vitals:   10/12/20 0400 10/12/20 0500 10/12/20 0530 10/12/20 0600  BP: (!) 182/91 (!) 159/47  Marland Kitchen)  171/54  Pulse: (!) 58 (!) 59 (!) 59 60  Resp: 17 (!) 21 10 16   Temp: 98.6 F (37 C)     TempSrc: Oral     SpO2: 98% 98% 98% 95%  Weight:      Height:        Intake/Output Summary (Last 24 hours) at 10/12/2020 0743 Last data filed at 10/12/2020 0522 Gross per 24 hour  Intake 92.81 ml  Output --  Net 92.81 ml   Last 3 Weights 10/11/2020 10/10/2020 09/28/2020  Weight (lbs) 131 lb 9.8 oz 121 lb 4.1 oz 138 lb  Weight (kg) 59.7 kg 55 kg 62.596 kg     Body mass index is 24.07 kg/m.   General:  Elderly woman in mild distress due to nausea HEENT: normal. RIJ TVP. Neck: no JVD Vascular: No carotid bruits; Distal pulses 2+ bilaterally Cardiac:  warm extremities, regular rhythm with pacing.  Lungs:  clear to auscultation bilaterally, no wheezing, rhonchi or rales  Abd: soft, nontender, no hepatomegaly  Ext: no edema Musculoskeletal:  No deformities Skin: warm and dry  Neuro:  no focal abnormalities noted Psych:  anxious but appropriate   EKG:  The EKG was personally reviewed and demonstrates:   Mobitz II with sinus rhythm, RBBB   Telemetry:  Telemetry was personally reviewed and demonstrates:  pacing this morning.  I did turn down her pacemaker this morning. No ventricular escape present at VVI 30. Capture threshold < 1 mA.  Relevant CV Studies:  10/11/2020 Echo personally reviewed: LVEF 60%. No significant valvular abnormalities  10/11/2020 [facility] Conclusions: Severe single-vessel coronary artery  disease with diffuse disease involving relatively small RCA extending from the ostium through the mid/distal vessel of up to 90%.  Mild-moderate disease is also noted in the left coronary artery. Mildly elevated left ventricular filling pressure (LVEDP 15-20 mmHg). Successful placement of 49F temporary transvenous pacing wire via the right internal jugular vein.  Laboratory Data:  High Sensitivity Troponin:   Recent Labs  Lab 10/10/20 1624 10/10/20 1850  TROPONINIHS 8 9     Chemistry Recent Labs  Lab 10/10/20 1624 10/11/20 0626  NA 135 137  K 4.5 4.2  CL 100 100  CO2 27 27  GLUCOSE 360* 226*  BUN 37* 26*  CREATININE 1.12* 0.74  CALCIUM 8.5* 8.4*  MG 1.9  --   GFRNONAA 47* >60  ANIONGAP 8 10    Recent Labs  Lab 10/10/20 1624  PROT 6.9  ALBUMIN 3.9  AST 21  ALT 17  ALKPHOS 65  BILITOT 0.5   Lipids No results for input(s): CHOL, TRIG, HDL, LABVLDL, LDLCALC, CHOLHDL in the last 168 hours.  Hematology Recent Labs  Lab 10/10/20 1624 10/11/20 0626  WBC 8.6 8.1  RBC 3.94 3.85*  HGB 12.0 12.1  HCT 36.4 35.2*  MCV 92.4 91.4  MCH 30.5 31.4  MCHC 33.0 34.4  RDW 13.0 13.1  PLT 184 163   Thyroid  Recent Labs  Lab 10/10/20 1624  TSH 3.281  FREET4 1.06    BNPNo results for input(s): BNP, PROBNP in the last 168 hours.  DDimer  Recent Labs  Lab 10/10/20 1624  DDIMER 0.70*     Radiology/Studies:  CT HEAD WO CONTRAST (10/12/20)  Result Date: 10/10/2020 CLINICAL DATA:  Dizziness. EXAM: CT HEAD WITHOUT CONTRAST TECHNIQUE: Contiguous axial images were obtained from the base of the skull through the vertex without intravenous contrast. COMPARISON:  CT head 03/01/2016 BRAIN: BRAIN Cerebral ventricle sizes are concordant with the  degree of cerebral volume loss. Patchy and confluent areas of decreased attenuation are noted throughout the deep and periventricular white matter of the cerebral hemispheres bilaterally, compatible with chronic microvascular ischemic disease. No  evidence of large-territorial acute infarction. No parenchymal hemorrhage. No mass lesion. No extra-axial collection. No mass effect or midline shift. No hydrocephalus. Basilar cisterns are patent. Vascular: No hyperdense vessel. Atherosclerotic calcifications are present within the cavernous internal carotid and vertebral arteries. Skull: No acute fracture or focal lesion. Sinuses/Orbits: Paranasal sinuses and mastoid air cells are clear. The orbits are unremarkable. Other: None. IMPRESSION: Negative for acute traumatic injury. Electronically Signed   By: Tish Frederickson M.D.   On: 10/10/2020 17:37   CT Angio Chest Pulmonary Embolism (PE) W or WO Contrast  Result Date: 10/11/2020 CLINICAL DATA:  Presyncope, hyperlipidemia, hypertension, diabetes EXAM: CT ANGIOGRAPHY CHEST WITH CONTRAST TECHNIQUE: Multidetector CT imaging of the chest was performed using the standard protocol during bolus administration of intravenous contrast. Multiplanar CT image reconstructions and MIPs were obtained to evaluate the vascular anatomy. CONTRAST:  61mL OMNIPAQUE IOHEXOL 350 MG/ML SOLN COMPARISON:  10/10/2020 FINDINGS: Cardiovascular: This is a technically adequate evaluation of the pulmonary vasculature. No filling defects or pulmonary emboli. The heart is unremarkable without pericardial effusion. Mild calcification of the mitral annulus. No evidence of thoracic aortic aneurysm or dissection. There is extensive atherosclerosis of the thoracic aorta and coronary vasculature. Mediastinum/Nodes: No enlarged mediastinal, hilar, or axillary lymph nodes. Thyroid gland, trachea, and esophagus demonstrate no significant findings. Lungs/Pleura: No acute airspace disease, effusion, or pneumothorax. Central airways are patent. Upper Abdomen: No acute abnormality. Musculoskeletal: No acute or destructive bony lesions. Reconstructed images demonstrate no additional findings. Review of the MIP images confirms the above findings. IMPRESSION:  1. No evidence of pulmonary embolus. 2. No acute intrathoracic process. 3. Aortic Atherosclerosis (ICD10-I70.0). Coronary artery atherosclerosis. Electronically Signed   By: Sharlet Salina M.D.   On: 10/11/2020 15:43   MR BRAIN WO CONTRAST  Result Date: 10/11/2020 CLINICAL DATA:  Transient ischemic attack EXAM: MRI HEAD WITHOUT CONTRAST TECHNIQUE: Multiplanar, multiecho pulse sequences of the brain and surrounding structures were obtained without intravenous contrast. COMPARISON:  None. FINDINGS: Brain: No acute infarct, mass effect or extra-axial collection. No acute or chronic hemorrhage. Hyperintense T2-weighted signal is moderately widespread throughout the white matter. Generalized volume loss without a clear lobar predilection. 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 The midline structures are normal. Vascular: Major flow voids are preserved. Skull and upper cervical spine: Normal calvarium and skull base. Visualized upper cervical spine and soft tissues are normal. Sinuses/Orbits:No paranasal sinus fluid levels or advanced mucosal thickening. No mastoid or middle ear effusion. Normal orbits. IMPRESSION: 1. No acute intracranial abnormality. 2. Findings of chronic ischemic microangiopathy and generalized volume loss. Electronically Signed   By: Deatra Robinson M.D.   On: 10/11/2020 01:26   CARDIAC CATHETERIZATION  Result Date: 10/11/2020 Conclusions: Severe single-vessel coronary artery disease with diffuse disease involving relatively small RCA extending from the ostium through the mid/distal vessel of up to 90%.  Mild-moderate disease is also noted in the left coronary artery. Mildly elevated left ventricular filling pressure (LVEDP 15-20 mmHg). Successful placement of 56F temporary transvenous pacing wire via the right internal jugular vein. Recommendations: Recommend medical therapy of relatively small and diffusely diseased RCA, which already fills via faint left-right  collaterals to the PDA. Secondary prevention of coronary artery disease. STAT portable chest radiograph when patient arrives in ICU for evaluation of temporary transvenous pacemaker  placement. EP consultation with plans for permanent pacemaker tomorrow morning with Dr. Lalla Brothers. Yvonne Kendall, MD [facility]  DG Chest Port 1 View  Result Date: 10/11/2020 CLINICAL DATA:  Complete heart block, right IJ line EXAM: PORTABLE CHEST 1 VIEW COMPARISON:  10/10/2020 FINDINGS: Right IJ temporary pacemaker lead overlies the right ventricle. Elevation of the right hemidiaphragm. Increased interstitial changes. No significant pleural effusion. No pneumothorax. Normal heart size. IMPRESSION: Right IJ temporary pacemaker lead overlies right ventricle. No pneumothorax. Persistent elevation of right hemidiaphragm. Increased interstitial changes probably reflect edema. Electronically Signed   By: Guadlupe Spanish M.D.   On: 10/11/2020 19:32   DG Chest Portable 1 View  Result Date: 10/10/2020 CLINICAL DATA:  Weakness. Bradycardia. Blood sugar 355. Near syncope. EXAM: PORTABLE CHEST 1 VIEW COMPARISON:  None. FINDINGS: The heart and mediastinal contours are unchanged. Aortic calcification. Similar-appearing left base atelectasis versus scarring. No focal consolidation. No pulmonary edema. No pleural effusion. No pneumothorax. No acute osseous abnormality.  Old healed left rib fractures. IMPRESSION: No active disease. Electronically Signed   By: Tish Frederickson M.D.   On: 10/10/2020 17:31   EEG adult  Result Date: 10/11/2020 Charlsie Quest, MD     10/11/2020  1:39 PM Patient Name: Amy Calhoun MRN: [id number] Epilepsy Attending: Charlsie Quest Referring Physician/Provider: Dr Londell Moh Date: 10/11/2020 Duration: 20.38 mins Patient history: 88 year old female with syncope.  EEG to evaluate for seizures. Level of alertness: Awake AEDs during EEG study: None Technical aspects: This EEG study was done with scalp electrodes  positioned according to the 10-20 International system of electrode placement. Electrical activity was acquired at a sampling rate of  and reviewed with a high frequency filter of  and a low frequency filter of . EEG data were recorded continuously and digitally stored. Description: The posterior dominant rhythm consists of 8-9 Hz activity of moderate voltage (25-35 uV) seen predominantly in posterior head regions, symmetric and reactive to eye opening and eye closing.  Intermittent left temporal 2-3Hz  delta slowing was also noted.  Physiologic photic driving was not seen during photic stimulation.  Hyperventilation was not performed.   ABNORMALITY -Intermittent slow, left temporal region IMPRESSION: This study is suggestive of nonspecific cortical dysfunction in left temporal region.  No seizures or epileptiform discharges were seen throughout the recording. Charlsie Quest   ECHOCARDIOGRAM COMPLETE  Result Date: 10/11/2020    ECHOCARDIOGRAM REPORT   Patient Name:   Amy Calhoun Date of Exam: 10/11/2020 Medical Rec #:  [id number]         Height:       67.0 in Accession #:    [id number]        Weight:       121.3 lb Date of Birth:  [birth year]          BSA:          1.635 m Patient Age:    89 years          BP:           141/65 mmHg Patient Gender: F                 HR:           68 bpm. Exam Location:  [facility] Procedure: 2D Echo, Cardiac Doppler and Color Doppler Indications:     Syncope R55  History:         Patient has no prior history of Echocardiogram examinations.  Risk Factors:Hypertension and Dyslipidemia.  Sonographer:     Cristela Blue Referring Phys:  [id number] AMY N COX Diagnosing Phys: Cristal Deer End MD  Sonographer Comments: Suboptimal apical window and no subcostal window. IMPRESSIONS  1. Left ventricular ejection fraction, by estimation, is 60 to 65%. The left ventricle has normal function. Left ventricular endocardial border not optimally defined to evaluate regional wall  motion. There is mild left ventricular hypertrophy. Left ventricular diastolic parameters are consistent with Grade I diastolic dysfunction (impaired relaxation). Elevated left atrial pressure.  2. Right ventricular systolic function is normal. The right ventricular size is normal. Tricuspid regurgitation signal is inadequate for assessing PA pressure.  3. Left atrial size was mildly dilated.  4. The mitral valve is degenerative. Trivial mitral valve regurgitation. No evidence of mitral stenosis. Moderate to severe mitral annular calcification.  5. The aortic valve is tricuspid. There is mild calcification of the aortic valve. There is mild thickening of the aortic valve. Aortic valve regurgitation is mild. Mild to moderate aortic valve sclerosis/calcification is present, without any evidence of aortic stenosis. FINDINGS  Left Ventricle: Left ventricular ejection fraction, by estimation, is 60 to 65%. The left ventricle has normal function. Left ventricular endocardial border not optimally defined to evaluate regional wall motion. The left ventricular internal cavity size was normal in size. There is mild left ventricular hypertrophy. Left ventricular diastolic parameters are consistent with Grade I diastolic dysfunction (impaired relaxation). Elevated left atrial pressure. Right Ventricle: The right ventricular size is normal. No increase in right ventricular wall thickness. Right ventricular systolic function is normal. Tricuspid regurgitation signal is inadequate for assessing PA pressure. Left Atrium: Left atrial size was mildly dilated. Right Atrium: Right atrial size was normal in size. Pericardium: The pericardium was not well visualized. Mitral Valve: The mitral valve is degenerative in appearance. There is mild thickening of the mitral valve leaflet(s). There is mild calcification of the mitral valve leaflet(s). Moderate to severe mitral annular calcification. Trivial mitral valve regurgitation. No evidence  of mitral valve stenosis. MV peak gradient, 11.3 mmHg. The mean mitral valve gradient is 4.0 mmHg. Tricuspid Valve: The tricuspid valve is normal in structure. Tricuspid valve regurgitation is trivial. Aortic Valve: The aortic valve is tricuspid. There is mild calcification of the aortic valve. There is mild thickening of the aortic valve. Aortic valve regurgitation is mild. Mild to moderate aortic valve sclerosis/calcification is present, without any evidence of aortic stenosis. Aortic valve mean gradient measures 4.5 mmHg. Aortic valve peak gradient measures 7.8 mmHg. Aortic valve area, by VTI measures 2.02 cm. Pulmonic Valve: The pulmonic valve was grossly normal. Pulmonic valve regurgitation is not visualized. No evidence of pulmonic stenosis. Aorta: The aortic root is normal in size and structure. Venous: The inferior vena cava was not well visualized. IAS/Shunts: The interatrial septum was not well visualized.  LEFT VENTRICLE PLAX 2D LVIDd:         3.70 cm  Diastology LVIDs:         2.10 cm  LV e' medial:    3.70 cm/s LV PW:         1.00 cm  LV E/e' medial:  28.6 LV IVS:        1.09 cm  LV e' lateral:   4.90 cm/s LVOT diam:     1.95 cm  LV E/e' lateral: 21.6 LV SV:         60 LV SV Index:   37 LVOT Area:     2.99 cm  RIGHT VENTRICLE  RV Basal diam:  2.20 cm RV S prime:     8.38 cm/s TAPSE (M-mode): 1.7 cm LEFT ATRIUM             Index       RIGHT ATRIUM          Index LA diam:        3.10 cm 1.90 cm/m  RA Area:     6.90 cm LA Vol (A2C):   68.7 ml 42.02 ml/m RA Volume:   10.00 ml 6.12 ml/m LA Vol (A4C):   36.1 ml 22.08 ml/m LA Biplane Vol: 54.9 ml 33.58 ml/m  AORTIC VALVE                    PULMONIC VALVE AV Area (Vmax):    1.68 cm     PV Vmax:        0.98 m/s AV Area (Vmean):   1.58 cm     PV Peak grad:   3.9 mmHg AV Area (VTI):     2.02 cm     RVOT Peak grad: 4 mmHg AV Vmax:           139.50 cm/s AV Vmean:          100.450 cm/s AV VTI:            0.298 m AV Peak Grad:      7.8 mmHg AV Mean Grad:       4.5 mmHg LVOT Vmax:         78.30 cm/s LVOT Vmean:        53.300 cm/s LVOT VTI:          0.202 m LVOT/AV VTI ratio: 0.68  AORTA Ao Root diam: 3.40 cm MITRAL VALVE MV Area (PHT): 2.26 cm     SHUNTS MV Area VTI:   1.53 cm     Systemic VTI:  0.20 m MV Peak grad:  11.3 mmHg    Systemic Diam: 1.95 cm MV Mean grad:  4.0 mmHg MV Vmax:       1.68 m/s MV Vmean:      97.1 cm/s MV Decel Time: 336 msec MV E velocity: 106.00 cm/s MV A velocity: 141.00 cm/s MV E/A ratio:  0.75 Cristal Deer End MD Electronically signed by Yvonne Kendall MD Signature Date/Time: 10/11/2020/12:37:28 PM    Final      Assessment and Plan:   Complete Heart Block Pt with history of conduction system disease by ECG now with syncope and CHB. She has a TVP in place and will require PPM. I have discussed the PPM procedure in detail with the patient and her daughter including the risks, recovery and they wish to proceed. Will plan for DDD PPM.  Risks, benefits, alternatives to PPM implantation were discussed in detail with the patient today. The patient understands that the risks include but are not limited to bleeding, infection, pneumothorax, perforation, tamponade, vascular damage, renal failure, MI, stroke, death, and lead dislodgement and wishes to proceed.   2. Nausea Seems to be more of a chronic issue with history of esophageal dilations. Zofran/reglan improving symptoms this AM.   For questions or updates, please contact [facility] Please consult [email or web address] for contact info under    Signed, Lanier Prude, MD  10/12/2020 7:43 AM

## 2020-10-12 NOTE — Progress Notes (Signed)
Cross Cover Patient persistently hypotensive with some lethargy at shift onset. Patient is s/p PPM for CHB. Previous chem panel with low sodium, elevated K and AKI.  Previously given 500 cc LR for her hypotension prior to shift.  Total 1000 ml NS bolus given and fluids changed to NS, now at 125 ml/h secondary to no urine output.  Mentation and blood pressure improving.  Patient transfer to stepdown status

## 2020-10-12 NOTE — Progress Notes (Signed)
No sedation given.

## 2020-10-13 ENCOUNTER — Inpatient Hospital Stay: Payer: Medicare HMO

## 2020-10-13 ENCOUNTER — Telehealth: Payer: Self-pay | Admitting: Family

## 2020-10-13 DIAGNOSIS — R001 Bradycardia, unspecified: Secondary | ICD-10-CM | POA: Diagnosis not present

## 2020-10-13 DIAGNOSIS — I442 Atrioventricular block, complete: Secondary | ICD-10-CM | POA: Diagnosis not present

## 2020-10-13 DIAGNOSIS — Z794 Long term (current) use of insulin: Secondary | ICD-10-CM

## 2020-10-13 DIAGNOSIS — R29898 Other symptoms and signs involving the musculoskeletal system: Secondary | ICD-10-CM

## 2020-10-13 DIAGNOSIS — E1165 Type 2 diabetes mellitus with hyperglycemia: Secondary | ICD-10-CM

## 2020-10-13 DIAGNOSIS — N1831 Chronic kidney disease, stage 3a: Secondary | ICD-10-CM | POA: Diagnosis not present

## 2020-10-13 DIAGNOSIS — Z950 Presence of cardiac pacemaker: Secondary | ICD-10-CM | POA: Diagnosis not present

## 2020-10-13 DIAGNOSIS — I951 Orthostatic hypotension: Secondary | ICD-10-CM

## 2020-10-13 LAB — CBC WITH DIFFERENTIAL/PLATELET
Abs Immature Granulocytes: 0.03 10*3/uL (ref 0.00–0.07)
Basophils Absolute: 0 10*3/uL (ref 0.0–0.1)
Basophils Relative: 0 %
Eosinophils Absolute: 0.2 10*3/uL (ref 0.0–0.5)
Eosinophils Relative: 2 %
HCT: 27.8 % — ABNORMAL LOW (ref 36.0–46.0)
Hemoglobin: 9.3 g/dL — ABNORMAL LOW (ref 12.0–15.0)
Immature Granulocytes: 0 %
Lymphocytes Relative: 30 %
Lymphs Abs: 2.7 10*3/uL (ref 0.7–4.0)
MCH: 30.9 pg (ref 26.0–34.0)
MCHC: 33.5 g/dL (ref 30.0–36.0)
MCV: 92.4 fL (ref 80.0–100.0)
Monocytes Absolute: 0.8 10*3/uL (ref 0.1–1.0)
Monocytes Relative: 9 %
Neutro Abs: 5.4 10*3/uL (ref 1.7–7.7)
Neutrophils Relative %: 59 %
Platelets: 126 10*3/uL — ABNORMAL LOW (ref 150–400)
RBC: 3.01 MIL/uL — ABNORMAL LOW (ref 3.87–5.11)
RDW: 13.5 % (ref 11.5–15.5)
WBC: 9.2 10*3/uL (ref 4.0–10.5)
nRBC: 0 % (ref 0.0–0.2)

## 2020-10-13 LAB — COMPREHENSIVE METABOLIC PANEL
ALT: 9 U/L (ref 0–44)
AST: 20 U/L (ref 15–41)
Albumin: 3.2 g/dL — ABNORMAL LOW (ref 3.5–5.0)
Alkaline Phosphatase: 43 U/L (ref 38–126)
Anion gap: 6 (ref 5–15)
BUN: 42 mg/dL — ABNORMAL HIGH (ref 8–23)
CO2: 26 mmol/L (ref 22–32)
Calcium: 7.6 mg/dL — ABNORMAL LOW (ref 8.9–10.3)
Chloride: 105 mmol/L (ref 98–111)
Creatinine, Ser: 1.68 mg/dL — ABNORMAL HIGH (ref 0.44–1.00)
GFR, Estimated: 29 mL/min — ABNORMAL LOW (ref >60)
Glucose, Bld: 144 mg/dL — ABNORMAL HIGH (ref 70–99)
Potassium: 4.9 mmol/L (ref 3.5–5.1)
Sodium: 137 mmol/L (ref 135–145)
Total Bilirubin: 0.7 mg/dL (ref 0.3–1.2)
Total Protein: 5.3 g/dL — ABNORMAL LOW (ref 6.5–8.1)

## 2020-10-13 LAB — GLUCOSE, CAPILLARY
Glucose-Capillary: 128 mg/dL — ABNORMAL HIGH (ref 70–99)
Glucose-Capillary: 129 mg/dL — ABNORMAL HIGH (ref 70–99)
Glucose-Capillary: 167 mg/dL — ABNORMAL HIGH (ref 70–99)
Glucose-Capillary: 153 mg/dL — ABNORMAL HIGH (ref 70–99)
Glucose-Capillary: 208 mg/dL — ABNORMAL HIGH (ref 70–99)

## 2020-10-13 LAB — MAGNESIUM: Magnesium: 1.9 mg/dL (ref 1.7–2.4)

## 2020-10-13 MED ORDER — MIDODRINE HCL 5 MG PO TABS
10.0000 mg | ORAL_TABLET | Freq: Once | ORAL | Status: AC
  Administered 2020-10-13: 10 mg via ORAL
  Filled 2020-10-13: qty 2

## 2020-10-13 MED ORDER — ALBUMIN HUMAN 25 % IV SOLN: 25.0000 g | Freq: Once | INTRAVENOUS | Status: DC

## 2020-10-13 MED ORDER — ALBUMIN HUMAN 25 % IV SOLN
25.0000 g | Freq: Once | INTRAVENOUS | Status: AC
  Administered 2020-10-13: 25 g via INTRAVENOUS
  Filled 2020-10-13: qty 100

## 2020-10-13 MED ORDER — SODIUM CHLORIDE 0.9 % IV BOLUS
250.0000 mL | Freq: Once | INTRAVENOUS | Status: AC
  Administered 2020-10-13: 250 mL via INTRAVENOUS

## 2020-10-13 NOTE — Evaluation (Signed)
Physical Therapy Evaluation Patient Details Name: Amy Calhoun MRN: [id number] DOB: [birth year] Today's Date: 10/13/2020  History of Present Illness  87 y.o. female with medical history significant for insulin-dependent diabetes mellitus, hypertension, hyperlipidemia, hypothyroidism, GERD, who presented to the hospital because of syncope.     She was found to have intermittent high-grade AV block with up to 6-second pause on telemetry.  She underwent left heart cath with placement of transvenous pacemaker on 10/11/2020.  This was followed by placement of a permanent pacemaker on 10/12/2020.  Clinical Impression  Pt with baseline confusion/flat affect so most PLOF, etc gathered from granddaughter in the room.  She was able to do some limited in-room ambulation with QC (apparently at baseline she has QC, rollator and scooter) and while slow and guarded she did not show any overt safety concerns apart from general lack of awareness and needing repeated cuing t/o the effort.  Pt's daughter normally checks in daily and is able to to give more assist if needed.      Recommendations for follow up therapy are one component of a multi-disciplinary discharge planning process, led by the attending physician.  Recommendations may be updated based on patient status, additional functional criteria and insurance authorization.  Follow Up Recommendations Home health PT;Supervision/Assistance - 24 hour    Equipment Recommendations  None recommended by PT    Recommendations for Other Services       Precautions / Restrictions Precautions Precautions: Fall Restrictions Weight Bearing Restrictions: Yes LUE Weight Bearing:  (post pacemaker)      Mobility  Bed Mobility Overal bed mobility: Needs Assistance Bed Mobility: Supine to Sit     Supine to sit: Min assist;Mod assist     General bed mobility comments: Pt could not initiate movement toward EOB.  PT helped shift LEs and she was able to make some  move toward sitting but ultimately needed considerable assist with getting to upright    Transfers Overall transfer level: Needs assistance Equipment used: Quad cane Transfers: Sit to/from Stand Sit to Stand: Min assist         General transfer comment: Pt again slow to initiate movement and needed repeated cues but once she initiated she did not need a lot of assist to rise with QC and only light asist  Ambulation/Gait Ambulation/Gait assistance: Min guard Gait Distance (Feet): 15 Feet Assistive device: Quad cane       General Gait Details: Pt was able to manage a slow but safe small bout of ambulation in the room.  She had stable vitals (HR - predictably - in the 70s and O2 remaining in the 90s on room air), reports of minimal fatigue but need for constant cuing/guidance 2/2 confusion and safety awareness  Stairs            Wheelchair Mobility    Modified Rankin (Stroke Patients Only)       Balance Overall balance assessment: Needs assistance Sitting-balance support: Single extremity supported Sitting balance-Leahy Scale: Fair Sitting balance - Comments: Pt did have one episode of slowly drifting backward, but once readjusted was able to maintain sitting     Standing balance-Leahy Scale: Fair Standing balance comment: Pt cearly needing some UE support (QC in R hand) however did not display any LOBs or overt unsteadiness during guarded/slow in-room mobility.  Poor awareness made more formal testing untenable  Pertinent Vitals/Pain Pain Assessment: Faces Faces Pain Scale: No hurt    Home Living Family/patient expects to be discharged to:: Assisted living               Home Equipment: Walker - 4 wheels;Cane - quad      Prior Function Level of Independence: Needs assistance   Gait / Transfers Assistance Needed: apparently she is typically able to do some ambulation in the apartment with rollator of QC, uses scooter  to go to meals or any "prolonged" activity  ADL's / Homemaking Assistance Needed: daughter checks in daily, assists with bathing, meals (breakfast and dinner, group lunch at facility), apparently very limited OOH/community activity        Hand Dominance        Extremity/Trunk Assessment   Upper Extremity Assessment Upper Extremity Assessment: Difficult to assess due to impaired cognition (deferred L shoulder elevation/etc 2/2 pacer placement)    Lower Extremity Assessment Lower Extremity Assessment: Generalized weakness;Difficult to assess due to impaired cognition       Communication   Communication:  (flat affect with minimal verbalization)  Cognition Arousal/Alertness: Awake/alert Behavior During Therapy: Flat affect Overall Cognitive Status: Impaired/Different from baseline                                 General Comments: baseline confusion and HOH, granddaughter reports she is greatly improved from yesterday but not yet to baseline      General Comments      Exercises     Assessment/Plan    PT Assessment Patient needs continued PT services  PT Problem List Decreased strength;Decreased range of motion;Decreased activity tolerance;Decreased balance;Decreased mobility;Decreased cognition;Decreased knowledge of use of DME;Decreased safety awareness;Pain;Cardiopulmonary status limiting activity;Decreased knowledge of precautions       PT Treatment Interventions DME instruction;Gait training;Functional mobility training;Stair training;Therapeutic activities;Therapeutic exercise;Balance training;Neuromuscular re-education;Patient/family education;Cognitive remediation    PT Goals (Current goals can be found in the Care Plan section)  Acute Rehab PT Goals Patient Stated Goal: return to her facility PT Goal Formulation: With patient/family Time For Goal Achievement: 10/27/20 Potential to Achieve Goals: Fair    Frequency Min 2X/week   Barriers to  discharge        Co-evaluation               AM-PAC PT "6 Clicks" Mobility  Outcome Measure Help needed turning from your back to your side while in a flat bed without using bedrails?: A Little Help needed moving from lying on your back to sitting on the side of a flat bed without using bedrails?: A Little Help needed moving to and from a bed to a chair (including a wheelchair)?: A Little Help needed standing up from a chair using your arms (e.g., wheelchair or bedside chair)?: A Little Help needed to walk in hospital room?: A Little Help needed climbing 3-5 steps with a railing? : A Lot 6 Click Score: 17    End of Session Equipment Utilized During Treatment: Gait belt Activity Tolerance: Patient tolerated treatment well Patient left: in chair;with call bell/phone within reach;with nursing/sitter in room Nurse Communication: Mobility status PT Visit Diagnosis: Muscle weakness (generalized) (M62.81);Difficulty in walking, not elsewhere classified (R26.2);Unsteadiness on feet (R26.81);Pain Pain - Right/Left: Left Pain - part of body: Shoulder    Time: 9211-9417 PT Time Calculation (min) (ACUTE ONLY): 26 min   Charges:   PT Evaluation $PT Eval Low Complexity: 1 Low PT  Treatments $Gait Training: 8-22 mins        Malachi Pro, DPT 10/13/2020, 4:25 PM

## 2020-10-13 NOTE — Telephone Encounter (Signed)
I called and advised patient's daughter [name] on below. She will call us Monday to see when or if patient is discharged by Tuesday.

## 2020-10-13 NOTE — Discharge Instructions (Addendum)
After Your Pacemaker   You have a [employer] Pacemaker  ACTIVITY Do not lift your arm above shoulder height for 1 week after your procedure. After 7 days, you may progress as below.  You should remove your sling 24 hours after your procedure, unless otherwise instructed by your provider.     Thursday October 20, 2020  Friday October 21, 2020 Saturday October 22, 2020 Sunday October 23, 2020   Do not lift, push, pull, or carry anything over 10 pounds with the affected arm until 6 weeks (Thursday November 24, 2020 ) after your procedure.   You may drive AFTER your wound check, unless you have been told otherwise by your provider.   Ask your healthcare provider when you can go back to work   INCISION/Dressing  If large square, outer bandage is left in place, this can be removed after 24 hours from your procedure. Do not remove steri-strips or glue as below.   Monitor your Pacemaker site for redness, swelling, and drainage. Call the device clinic at [phone] if you experience these symptoms or fever/chills.  If your incision is sealed with Steri-strips or staples, you may shower 10 days after your procedure or when told by your provider. Do not remove the steri-strips or let the shower hit directly on your site. You may wash around your site with soap and water.    Avoid lotions, ointments, or perfumes over your incision until it is well-healed.  You may use a hot tub or a pool AFTER your wound check appointment if the incision is completely closed.  [employer]ce[employer] Alerts:  Some alerts are vibratory and others beep. These are NOT emergencies. Please call our office to let us know. If this occurs at night or on weekends, it can wait until the next business day. Send a remote transmission.  If your device is capable of reading fluid status (for heart failure), you will be offered monthly monitoring to review this with you.   DEVICE MANAGEMENT Remote monitoring is used to monitor your pacemaker  from home. This monitoring is scheduled every 91 days by our office. It allows [place] to keep an eye on the functioning of your device to ensure it is working properly. You will routinely see your Electrophysiologist annually (more often if necessary).   You should receive your ID card for your new device in 4-8 weeks. Keep this card with you at all times once received. Consider wearing a medical alert bracelet or necklace.  Your Pacemaker may be MRI compatible. This will be discussed at your next office visit/wound check.  You should avoid contact with strong electric or magnetic fields.   Do not use amateur (ham) radio equipment or electric (arc) welding torches. MP3 player headphones with magnets should not be used. Some devices are safe to use if held at least 12 inches (30 cm) from your Pacemaker. These include power tools, lawn mowers, and speakers. If you are unsure if something is safe to use, ask your health care provider.  When using your cell phone, hold it to the ear that is on the opposite side from the Pacemaker. Do not leave your cell phone in a pocket over the Pacemaker.  You may safely use electric blankets, heating pads, computers, and microwave ovens.  Call the office right away if: You have chest pain. You feel more short of breath than you have felt before. You feel more light-headed than you have felt before. Your incision starts to open up.  This information is not intended to replace advice given to you by your health care provider. Make sure you discuss any questions you have with your health care provider.

## 2020-10-13 NOTE — Progress Notes (Signed)
Discussed post PPM placement care instructions with [name], daughter, over the phone.  Went over restrictions in detail.  She expressed some concern regarding the limitations given her mother's reliance on a walker and cane.  Recommended her mother use a scooter if at all possible.  Care instructions provided in AVS as well.

## 2020-10-13 NOTE — Progress Notes (Signed)
Progress Note  Patient Name: Amy Calhoun Date of Encounter: 10/13/2020  Primary Cardiologist: [facility], Dr. Okey Dupre  Subjective   Somnolent when seen early this AM Spoke on the phone with daughter, Amy Calhoun to review the post PPM instructions.  Chest x-ray performed this morning with increasing interstitial opacities and right basilar opacity since the prior study and may reflect a combination of atelectasis versus asymmetric edema  Inpatient Medications    Scheduled Meds:  amitriptyline  150 mg Oral QHS   Chlorhexidine Gluconate Cloth  6 each Topical Q0600   docusate sodium  100 mg Oral BID   ezetimibe  10 mg Oral Daily   feeding supplement (GLUCERNA SHAKE)  237 mL Oral TID BM   ferrous sulfate  325 mg Oral Daily   gabapentin  200 mg Oral BID   insulin aspart  0-15 Units Subcutaneous TID AC & HS   insulin aspart  4 Units Subcutaneous TID WC   insulin glargine-yfgn  12 Units Subcutaneous QHS   levothyroxine  150 mcg Oral Daily   multivitamin with minerals  1 tablet Oral Daily   pantoprazole  80 mg Oral Daily   Ensure Max Protein  11 oz Oral Daily   pyridOXINE  100 mg Oral Daily   sodium chloride flush  3 mL Intravenous Q12H   sodium chloride flush  3 mL Intravenous Q12H   Continuous Infusions:  sodium chloride     sodium chloride 20 mL/hr at 10/11/20 1931   sodium chloride 75 mL/hr at 10/13/20 1117   promethazine (PHENERGAN) injection (IM or IVPB) Stopped (10/12/20 0446)   PRN Meds: sodium chloride, acetaminophen, acetaminophen-codeine, albuterol, metoCLOPramide (REGLAN) injection, ondansetron (ZOFRAN) IV, promethazine (PHENERGAN) injection (IM or IVPB), sodium chloride flush   Vital Signs    Vitals:   10/13/20 0200 10/13/20 0300 10/13/20 0500 10/13/20 0513  BP: (!) 101/58 (!) 104/43  (!) 104/49  Pulse: 68 71  71  Resp: 19   14  Temp: 98.6 F (37 C)     TempSrc: Oral     SpO2: 100% 97%  97%  Weight:   62.5 kg   Height:        Intake/Output Summary (Last 24  hours) at 10/13/2020 1133 Last data filed at 10/13/2020 0300 Gross per 24 hour  Intake 2140.74 ml  Output 600 ml  Net 1540.74 ml   Last 3 Weights 10/13/2020 10/11/2020 10/10/2020  Weight (lbs) 137 lb 12.6 oz 131 lb 9.8 oz 121 lb 4.1 oz  Weight (kg) 62.5 kg 59.7 kg 55 kg      Telemetry    paced rhythm s/p PPM insertion- Personally Reviewed  ECG    No new tracings - Personally Reviewed  Physical Exam   GEN: Elderly female.  No acute distress.  Somnolent Neck: No JVD Cardiac: RRR, no murmurs, rubs, or gallops.  Respiratory: Poor inspiratory effort. GI: Soft, nontender, non-distended  MS: No edema with SCDs in place; No deformity. Neuro:  Nonfocal  Psych: Normal affect   Labs    High Sensitivity Troponin:   Recent Labs  Lab 10/10/20 1624 10/10/20 1850  TROPONINIHS 8 9      Chemistry Recent Labs  Lab 10/10/20 1624 10/11/20 0626 10/12/20 1116 10/12/20 1954 10/13/20 0454  NA 135   < > 133* 134* 137  K 4.5   < > 5.7* 4.7 4.9  CL 100   < > 101 101 105  CO2 27   < > 23 25 26   GLUCOSE  360*   < > 475* 227* 144*  BUN 37*   < > 30* 37* 42*  CREATININE 1.12*   < > 1.17* 1.50* 1.68*  CALCIUM 8.5*   < > 7.9* 7.6* 7.6*  PROT 6.9  --   --   --  5.3*  ALBUMIN 3.9  --   --  2.7* 3.2*  AST 21  --   --   --  20  ALT 17  --   --   --  9  ALKPHOS 65  --   --   --  43  BILITOT 0.5  --   --   --  0.7  GFRNONAA 47*   < > 44* 33* 29*  ANIONGAP 8   < > 9 8 6    < > = values in this interval not displayed.     Hematology Recent Labs  Lab 10/11/20 0626 10/12/20 1116 10/13/20 0454  WBC 8.1 11.8* 9.2  RBC 3.85* 4.32 3.01*  HGB 12.1 12.9 9.3*  HCT 35.2* 39.3 27.8*  MCV 91.4 91.0 92.4  MCH 31.4 29.9 30.9  MCHC 34.4 32.8 33.5  RDW 13.1 12.9 13.5  PLT 163 172 126*    BNPNo results for input(s): BNP, PROBNP in the last 168 hours.   DDimer  Recent Labs  Lab 10/10/20 1624  DDIMER 0.70*     Radiology    DG Chest 2 View  Result Date: 10/12/2020 CLINICAL DATA:   Evaluate pacemaker EXAM: CHEST - 2 VIEW COMPARISON:  Chest x-ray dated October 11, 2020 FINDINGS: Interval placement of left chest wall dual lead pacemaker with leads overlying the expected area of the right atrium and right ventricle. Cardiac and mediastinal contours are unchanged and within normal limits. Decreased bilateral heterogeneous opacities. Lateral view is limited due to arms down position. No large pleural effusion or evidence of pneumothorax. IMPRESSION: Interval placement of left chest wall dual lead pacemaker. No evidence of pleural effusion or pneumothorax. Decreased bilateral heterogeneous pulmonary opacities, likely due to improved pulmonary edema. Electronically Signed   By: October 13, 2020 M.D.   On: 10/12/2020 11:31   CT Angio Chest Pulmonary Embolism (PE) W or WO Contrast  Result Date: 10/11/2020 CLINICAL DATA:  Presyncope, hyperlipidemia, hypertension, diabetes EXAM: CT ANGIOGRAPHY CHEST WITH CONTRAST TECHNIQUE: Multidetector CT imaging of the chest was performed using the standard protocol during bolus administration of intravenous contrast. Multiplanar CT image reconstructions and MIPs were obtained to evaluate the vascular anatomy. CONTRAST:  17mL OMNIPAQUE IOHEXOL 350 MG/ML SOLN COMPARISON:  10/10/2020 FINDINGS: Cardiovascular: This is a technically adequate evaluation of the pulmonary vasculature. No filling defects or pulmonary emboli. The heart is unremarkable without pericardial effusion. Mild calcification of the mitral annulus. No evidence of thoracic aortic aneurysm or dissection. There is extensive atherosclerosis of the thoracic aorta and coronary vasculature. Mediastinum/Nodes: No enlarged mediastinal, hilar, or axillary lymph nodes. Thyroid gland, trachea, and esophagus demonstrate no significant findings. Lungs/Pleura: No acute airspace disease, effusion, or pneumothorax. Central airways are patent. Upper Abdomen: No acute abnormality. Musculoskeletal: No acute or  destructive bony lesions. Reconstructed images demonstrate no additional findings. Review of the MIP images confirms the above findings. IMPRESSION: 1. No evidence of pulmonary embolus. 2. No acute intrathoracic process. 3. Aortic Atherosclerosis (ICD10-I70.0). Coronary artery atherosclerosis. Electronically Signed   By: 10/12/2020 M.D.   On: 10/11/2020 15:43   CARDIAC CATHETERIZATION  Result Date: 10/11/2020 Conclusions: Severe single-vessel coronary artery disease with diffuse disease involving relatively small RCA extending from the  ostium through the mid/distal vessel of up to 90%.  Mild-moderate disease is also noted in the left coronary artery. Mildly elevated left ventricular filling pressure (LVEDP 15-20 mmHg). Successful placement of 64F temporary transvenous pacing wire via the right internal jugular vein. Recommendations: Recommend medical therapy of relatively small and diffusely diseased RCA, which already fills via faint left-right collaterals to the PDA. Secondary prevention of coronary artery disease. STAT portable chest radiograph when patient arrives in ICU for evaluation of temporary transvenous pacemaker placement. EP consultation with plans for permanent pacemaker tomorrow morning with Dr. Lalla Brothers. Yvonne Kendall, MD [facility]  DG Chest Port 1 View  Result Date: 10/13/2020 CLINICAL DATA:  Evaluate pacemaker in a 87 year old female. EXAM: PORTABLE CHEST 1 VIEW COMPARISON:  Comparison made with CT of the chest October 11, 2020. FINDINGS: EKG leads project over the chest. Pads for defibrillation/pacing also project over the chest. Dual lead pacer device entering via LEFT-sided approach with power pack over the LEFT chest appears similar to the prior study. Trachea midline. Cardiomediastinal contours with top-normal heart size unchanged from previous imaging. RIGHT hemidiaphragm remains elevated. No lobar consolidation. Increased opacity at the RIGHT lung base with heterogeneous  opacities throughout both RIGHT and LEFT chest showing slight increase in both of these findings compared to previous imaging. No visible pneumothorax. On limited assessment there is no acute skeletal process. IMPRESSION: Dual lead pacer device entering via LEFT-sided approach with power pack over the LEFT chest. Increasing interstitial opacities and RIGHT basilar opacity since the prior study may reflect a combination of atelectasis and worsening asymmetric edema, would also correlate with any signs of infection. Electronically Signed   By: Donzetta Kohut M.D.   On: 10/13/2020 08:02   DG Chest Port 1 View  Result Date: 10/11/2020 CLINICAL DATA:  Complete heart block, right IJ line EXAM: PORTABLE CHEST 1 VIEW COMPARISON:  10/10/2020 FINDINGS: Right IJ temporary pacemaker lead overlies the right ventricle. Elevation of the right hemidiaphragm. Increased interstitial changes. No significant pleural effusion. No pneumothorax. Normal heart size. IMPRESSION: Right IJ temporary pacemaker lead overlies right ventricle. No pneumothorax. Persistent elevation of right hemidiaphragm. Increased interstitial changes probably reflect edema. Electronically Signed   By: Guadlupe Spanish M.D.   On: 10/11/2020 19:32   EEG adult  Result Date: 10/11/2020 Charlsie Quest, MD     10/11/2020  1:39 PM Patient Name: Keandra Medero MRN: [id number] Epilepsy Attending: Charlsie Quest Referring Physician/Provider: Dr Londell Moh Date: 10/11/2020 Duration: 20.38 mins Patient history: 87 year old female with syncope.  EEG to evaluate for seizures. Level of alertness: Awake AEDs during EEG study: None Technical aspects: This EEG study was done with scalp electrodes positioned according to the 10-20 International system of electrode placement. Electrical activity was acquired at a sampling rate of 500Hz  and reviewed with a high frequency filter of 70Hz  and a low frequency filter of 1Hz . EEG data were recorded continuously and digitally  stored. Description: The posterior dominant rhythm consists of 8-9 Hz activity of moderate voltage (25-35 uV) seen predominantly in posterior head regions, symmetric and reactive to eye opening and eye closing.  Intermittent left temporal 2-3Hz  delta slowing was also noted.  Physiologic photic driving was not seen during photic stimulation.  Hyperventilation was not performed.   ABNORMALITY -Intermittent slow, left temporal region IMPRESSION: This study is suggestive of nonspecific cortical dysfunction in left temporal region.  No seizures or epileptiform discharges were seen throughout the recording.    Cardiac Studies  LHC 10/11/20 Conclusions: Severe single-vessel coronary artery disease with diffuse disease involving relatively small RCA extending from the ostium through the mid/distal vessel of up to 90%.  Mild-moderate disease is also noted in the left coronary artery. Mildly elevated left ventricular filling pressure (LVEDP 15-20 mmHg). Successful placement of 51F temporary transvenous pacing wire via the right internal jugular vein. Recommendations: Recommend medical therapy of relatively small and diffusely diseased RCA, which already fills via faint left-right collaterals to the PDA. Secondary prevention of coronary artery disease. STAT portable chest radiograph when patient arrives in ICU for evaluation of temporary transvenous pacemaker placement. EP consultation with plans for permanent pacemaker tomorrow morning with Dr. Lalla Brothers.  Echo 10/11/20  1. Left ventricular ejection fraction, by estimation, is 60 to 65%. The  left ventricle has normal function. Left ventricular endocardial border  not optimally defined to evaluate regional wall motion. There is mild left  ventricular hypertrophy. Left  ventricular diastolic parameters are consistent with Grade I diastolic  dysfunction (impaired relaxation). Elevated left atrial pressure.   2. Right ventricular systolic  function is normal. The right ventricular  size is normal. Tricuspid regurgitation signal is inadequate for assessing  PA pressure.   3. Left atrial size was mildly dilated.   4. The mitral valve is degenerative. Trivial mitral valve regurgitation.  No evidence of mitral stenosis. Moderate to severe mitral annular  calcification.   5. The aortic valve is tricuspid. There is mild calcification of the  aortic valve. There is mild thickening of the aortic valve. Aortic valve  regurgitation is mild. Mild to moderate aortic valve  sclerosis/calcification is present, without any evidence  of aortic stenosis.    Patient Profile     87 y.o. female with hx of insulin-dependent diabetes mellitus with peripheral neuropathy, COVID in 01/2018, unintentional 30 pound weight loss throughout the COVID pandemic, HTN, orthostatic hypotension, HLD, hypothyroidism, and GERD who is being seen today after PPM.  Assessment & Plan    Complete heart block s/p [employer] PPM --PPM implantation after presented to the hospital 9/27 with multiple episodes of syncope in the setting of high degree AV block.  On telemetry, pauses greater than 6 seconds noted with LHC and temporary transvenous pacemaker inserted.  On 9/28, she underwent PPM insertion.  Today, reviewed post PPM recommendations with patient and family. Recommended she avoid her walker, given restrictions regarding the movement of her arm as in her AVS.  Follow-up as recommended by EP for wound care check and OP EP follow-up.  Severe single-vessel CAD --No chest pain reported today.  10/11/2020 echo with EF 60 to 65%, mild LVH, elevated LAP, G1 DD.  S/p LHC 9/27 as above with severe single-vessel CAD and mild to moderate disease in the left coronary artery.  Recommend medical therapy of diffusely diseased RCA with faint left-to-right collaterals to the PDA.  Risk factor modification recommended, including Zetia for LDL control.  Diastolic dysfunction --Appears  euvolemic and well compensated on exam.  Lisinopril was discontinued earlier due to softer pressures and orthostatic hypotension history with most recent pressures improved from that of previous.  Remainder per IM  For questions or updates, please contact [facility] Please consult [email or web address] for contact info under        Signed, Lennon Alstrom, PA-C  10/13/2020, 11:33 AM

## 2020-10-13 NOTE — Telephone Encounter (Signed)
Could we add patient on Tuesday possibly or wait until Monday 10/10?

## 2020-10-13 NOTE — Progress Notes (Addendum)
Progress Note    Amy Calhoun  FBP:[id number] DOB: May 25, 1928  DOA: 10/10/2020 PCP: Allegra Grana, FNP      Brief Narrative:    Medical records reviewed and are as summarized below:  Amy Calhoun is a 87 y.o. female with medical history significant for insulin-dependent diabetes mellitus, hypertension, hyperlipidemia, hypothyroidism, GERD, who presented to the hospital because of syncope.  She was found to have intermittent high-grade AV block with up to 6-second pause on telemetry.  She underwent left heart cath with placement of transvenous pacemaker on 10/11/2020.  This was followed by placement of a permanent pacemaker on 10/12/2020.    Assessment/Plan:   Principal Problem:   Syncope Active Problems:   Esophageal dysmotility   Hyperlipidemia   Hypothyroidism   HTN (hypertension), malignant   CKD (chronic kidney disease) stage 3, GFR 30-59 ml/min (HCC)   AKI (acute kidney injury) (HCC)   Gastroesophageal reflux disease without esophagitis   Fatigue   Depression   Weakness of both lower extremities   Type 2 diabetes mellitus with hyperglycemia, with long-term current use of insulin (HCC)   B12 deficiency   Abnormal gait   Symptomatic bradycardia   Heart block AV complete (HCC)   Malnutrition of moderate degree   Body mass index is 25.2 kg/m.   Complete heart block, s/p syncope: S/p placement of transvenous pacemaker on 10/11/2020 and placement of permanent pacemaker on 10/12/2020. Follow up with cardiologist.   Insulin-dependent diabetes mellitus with hyperglycemia: Continue insulin glargine and NovoLog.  Monitor glucose levels closely Hemoglobin A1c was 9.4 on 09/21/2020.  Hypotension: Lisinopril still on hold.  She has been started on midodrine to help with blood pressure  AKI: Creatinine has gone up from 1.17-1.68.  Continue IV fluids for hydration and monitor BMP. Of note, patient recently had CTA of the chest and left heart cath on 10/11/2020,  significant risk for contrast-induced AKI.  Peripheral neuropathy: Analgesics including Tylenol # 3 as needed for pain  Hyperkalemia and hyponatremia: Improved.  Chronic diastolic dysfunction: 2D echo showed grade 1 diastolic dysfunction and EF estimated at 60 to 65%.   Diet Order             Diet Carb Modified Fluid consistency: Thin; Room service appropriate? Yes  Diet effective now                      Consultants: Cardiologist  Procedures: S/p transvenous pacemaker and subsequent permanent pacemaker placement    Medications:    amitriptyline  150 mg Oral QHS   Chlorhexidine Gluconate Cloth  6 each Topical Q0600   docusate sodium  100 mg Oral BID   ezetimibe  10 mg Oral Daily   feeding supplement (GLUCERNA SHAKE)  237 mL Oral TID BM   ferrous sulfate  325 mg Oral Daily   gabapentin  200 mg Oral BID   insulin aspart  0-15 Units Subcutaneous TID AC & HS   insulin aspart  4 Units Subcutaneous TID WC   insulin glargine-yfgn  12 Units Subcutaneous QHS   levothyroxine  150 mcg Oral Daily   multivitamin with minerals  1 tablet Oral Daily   pantoprazole  80 mg Oral Daily   Ensure Max Protein  11 oz Oral Daily   pyridOXINE  100 mg Oral Daily   sodium chloride flush  3 mL Intravenous Q12H   sodium chloride flush  3 mL Intravenous Q12H   Continuous Infusions:  sodium chloride  sodium chloride 20 mL/hr at 10/11/20 1931   sodium chloride 75 mL/hr at 10/13/20 1143   promethazine (PHENERGAN) injection (IM or IVPB) Stopped (10/12/20 0446)     Anti-infectives (From admission, onward)    Start     Dose/Rate Route Frequency Ordered Stop   10/12/20 0908  ceFAZolin 1 g / gentamicin 80 mg in NS 500 mL surgical irrigation  Status:  Discontinued          As needed 10/12/20 0908 10/12/20 1024   10/12/20 0845  gentamicin (GARAMYCIN) 80 mg in sodium chloride 0.9 % 500 mL irrigation  Status:  Discontinued        80 mg Irrigation On call 10/12/20 0756 10/12/20 1024    10/12/20 0845  ceFAZolin (ANCEF) IVPB 2g/100 mL premix  Status:  Discontinued        2 g 200 mL/hr over 30 Minutes Intravenous On call 10/12/20 0756 10/12/20 1024   10/12/20 0805  ceFAZolin (ANCEF) IVPB 1 g/50 mL premix  Status:  Discontinued        over 30 Minutes  Continuous PRN 10/12/20 0805 10/12/20 1024              Family Communication/Anticipated D/C date and plan/Code Status   DVT prophylaxis: SCD's Start: 10/11/20 1839 Place TED hose Start: 10/10/20 1904     Code Status: Full Code  Family Communication: Plan discussed with her daughter and granddaughter at the bedside Disposition Plan:    Status is: Inpatient  Remains inpatient appropriate because:IV treatments appropriate due to intensity of illness or inability to take PO  Dispo: The patient is from: Home              Anticipated d/c is to: Home              Patient currently is not medically stable to d/c.   Difficult to place patient No           Subjective:   Interval events noted.  Patient required additional bolus of fluids and maintenance fluids last night for persistent hypotension.  Patient complains of pain in the lower extremities.  No chest pain, shortness of breath or dizziness.  Her daughter and granddaughter were at the bedside.  Daughter was concerned that patient was not making much urine. Daughter said patient has been having lower extremity pain from neuropathy and she usually takes Tylenol No. 3 on a daily basis.  Patient is hard of hearing but alert and communicative at the time of my visit.  Objective:    Vitals:   10/13/20 0800 10/13/20 0900 10/13/20 1000 10/13/20 1100  BP: (!) 121/54 (!) 120/56 (!) 114/51 (!) 127/54  Pulse: 66 66 69 70  Resp: 12     Temp: (!) 97.5 F (36.4 C)     TempSrc: Oral     SpO2: 100% 100% 98% 99%  Weight:      Height:       No data found.   Intake/Output Summary (Last 24 hours) at 10/13/2020 1346 Last data filed at 10/13/2020 1143 Gross per  24 hour  Intake 2595.69 ml  Output 600 ml  Net 1995.69 ml   Filed Weights   10/10/20 1620 10/11/20 1810 10/13/20 0500  Weight: 55 kg 59.7 kg 62.5 kg    Exam:  GEN: NAD SKIN: Warm and dry.  Dressing on pacemaker left upper chest is clean, dry and intact. EYES: No pallor or icterus ENT: MMM, hard of hearing CV: RRR PULM: CTA B  ABD: soft, ND, NT, +BS CNS: AAO x 3, non focal EXT: No edema or tenderness      Data Reviewed:   I have personally reviewed following labs and imaging studies:  Labs: Labs show the following:   Basic Metabolic Panel: Recent Labs  Lab 10/10/20 1624 10/11/20 0626 10/12/20 1116 10/12/20 1954 10/13/20 0454  NA 135 137 133* 134* 137  K 4.5 4.2 5.7* 4.7 4.9  CL 100 100 101 101 105  CO2 27 27 23 25 26   GLUCOSE 360* 226* 475* 227* 144*  BUN 37* 26* 30* 37* 42*  CREATININE 1.12* 0.74 1.17* 1.50* 1.68*  CALCIUM 8.5* 8.4* 7.9* 7.6* 7.6*  MG 1.9  --   --  1.9 1.9   GFR Estimated Creatinine Clearance: 19.4 mL/min (A) (by C-G formula based on SCr of 1.68 mg/dL (H)). Liver Function Tests: Recent Labs  Lab 10/10/20 1624 10/12/20 1954 10/13/20 0454  AST 21  --  20  ALT 17  --  9  ALKPHOS 65  --  43  BILITOT 0.5  --  0.7  PROT 6.9  --  5.3*  ALBUMIN 3.9 2.7* 3.2*   No results for input(s): LIPASE, AMYLASE in the last 168 hours. No results for input(s): AMMONIA in the last 168 hours. Coagulation profile No results for input(s): INR, PROTIME in the last 168 hours.  CBC: Recent Labs  Lab 10/10/20 1624 10/11/20 0626 10/12/20 1116 10/13/20 0454  WBC 8.6 8.1 11.8* 9.2  NEUTROABS 4.9  --   --  5.4  HGB 12.0 12.1 12.9 9.3*  HCT 36.4 35.2* 39.3 27.8*  MCV 92.4 91.4 91.0 92.4  PLT 184 163 172 126*   Cardiac Enzymes: No results for input(s): CKTOTAL, CKMB, CKMBINDEX, TROPONINI in the last 168 hours. BNP (last 3 results) No results for input(s): PROBNP in the last 8760 hours. CBG: Recent Labs  Lab 10/12/20 1625 10/12/20 2121  10/13/20 0737 10/13/20 0807 10/13/20 1113  GLUCAP 311* 182* 129* 128* 167*   D-Dimer: Recent Labs    10/10/20 1624  DDIMER 0.70*   Hgb A1c: No results for input(s): HGBA1C in the last 72 hours. Lipid Profile: No results for input(s): CHOL, HDL, LDLCALC, TRIG, CHOLHDL, LDLDIRECT in the last 72 hours. Thyroid function studies: Recent Labs    10/10/20 1624  TSH 3.281   Anemia work up: Recent Labs    10/10/20 1924  VITAMINB12 912   Sepsis Labs: Recent Labs  Lab 10/10/20 1624 10/10/20 1924 10/11/20 0626 10/12/20 1116 10/13/20 0454  PROCALCITON  --  <0.10  --   --   --   WBC 8.6  --  8.1 11.8* 9.2    Microbiology Recent Results (from the past 240 hour(s))  Resp Panel by RT-PCR (Flu A&B, Covid) Nasopharyngeal Swab     Status: None   Collection Time: 10/10/20  4:24 PM   Specimen: Nasopharyngeal Swab; Nasopharyngeal(NP) swabs in vial transport medium  Result Value Ref Range Status   SARS Coronavirus 2 by RT PCR NEGATIVE NEGATIVE Final    Comment: (NOTE) SARS-CoV-2 target nucleic acids are NOT DETECTED.  The SARS-CoV-2 RNA is generally detectable in upper respiratory specimens during the acute phase of infection. The lowest concentration of SARS-CoV-2 viral copies this assay can detect is 138 copies/mL. A negative result does not preclude SARS-Cov-2 infection and should not be used as the sole basis for treatment or other patient management decisions. A negative result may occur with  improper specimen collection/handling, submission of specimen other  than nasopharyngeal swab, presence of viral mutation(s) within the areas targeted by this assay, and inadequate number of viral copies(<138 copies/mL). A negative result must be combined with clinical observations, patient history, and epidemiological information. The expected result is Negative.  Fact Sheet for Patients:  BloggerCourse.com  Fact Sheet for Healthcare Providers:   SeriousBroker.it  This test is no t yet approved or cleared by the [place] FDA and  has been authorized for detection and/or diagnosis of SARS-CoV-2 by FDA under an Emergency Use Authorization (EUA). This EUA will remain  in effect (meaning this test can be used) for the duration of the COVID-19 declaration under Section 564(b)(1) of the Act, 21 U.S.C.section 360bbb-3(b)(1), unless the authorization is terminated  or revoked sooner.       Influenza A by PCR NEGATIVE NEGATIVE Final   Influenza B by PCR NEGATIVE NEGATIVE Final    Comment: (NOTE) The Xpert Xpress SARS-CoV-2/FLU/RSV plus assay is intended as an aid in the diagnosis of influenza from Nasopharyngeal swab specimens and should not be used as a sole basis for treatment. Nasal washings and aspirates are unacceptable for Xpert Xpress SARS-CoV-2/FLU/RSV testing.  Fact Sheet for Patients: BloggerCourse.com  Fact Sheet for Healthcare Providers: SeriousBroker.it  This test is not yet approved or cleared by the [place] FDA and has been authorized for detection and/or diagnosis of SARS-CoV-2 by FDA under an Emergency Use Authorization (EUA). This EUA will remain in effect (meaning this test can be used) for the duration of the COVID-19 declaration under Section 564(b)(1) of the Act, 21 U.S.C. section 360bbb-3(b)(1), unless the authorization is terminated or revoked.  Performed at [facility], [address] [place], [place] [address]   MRSA Next Gen by PCR, Nasal     Status: None   Collection Time: 10/11/20  6:10 PM   Specimen: Nasal Mucosa; Nasal Swab  Result Value Ref Range Status   MRSA by PCR Next Gen NOT DETECTED NOT DETECTED Final    Comment: (NOTE) The GeneXpert MRSA Assay (FDA approved for NASAL specimens only), is one component of a comprehensive MRSA colonization surveillance program. It is not intended to  diagnose MRSA infection nor to guide or monitor treatment for MRSA infections. Test performance is not FDA approved in patients less than 50 years old. Performed at [facility], [address] [place], [place] [address]     Procedures and diagnostic studies:  DG Chest 2 View  Result Date: 10/12/2020 CLINICAL DATA:  Evaluate pacemaker EXAM: CHEST - 2 VIEW COMPARISON:  Chest x-ray dated October 11, 2020 FINDINGS: Interval placement of left chest wall dual lead pacemaker with leads overlying the expected area of the right atrium and right ventricle. Cardiac and mediastinal contours are unchanged and within normal limits. Decreased bilateral heterogeneous opacities. Lateral view is limited due to arms down position. No large pleural effusion or evidence of pneumothorax. IMPRESSION: Interval placement of left chest wall dual lead pacemaker. No evidence of pleural effusion or pneumothorax. Decreased bilateral heterogeneous pulmonary opacities, likely due to improved pulmonary edema. Electronically Signed   By: Allegra Lai M.D.   On: 10/12/2020 11:31   CT Angio Chest Pulmonary Embolism (PE) W or WO Contrast  Result Date: 10/11/2020 CLINICAL DATA:  Presyncope, hyperlipidemia, hypertension, diabetes EXAM: CT ANGIOGRAPHY CHEST WITH CONTRAST TECHNIQUE: Multidetector CT imaging of the chest was performed using the standard protocol during bolus administration of intravenous contrast. Multiplanar CT image reconstructions and MIPs were obtained to evaluate the vascular anatomy. CONTRAST:  59mL OMNIPAQUE IOHEXOL 350 MG/ML SOLN COMPARISON:  10/10/2020 FINDINGS: Cardiovascular: This is a technically adequate evaluation of the pulmonary vasculature. No filling defects or pulmonary emboli. The heart is unremarkable without pericardial effusion. Mild calcification of the mitral annulus. No evidence of thoracic aortic aneurysm or dissection. There is extensive atherosclerosis of the thoracic aorta and  coronary vasculature. Mediastinum/Nodes: No enlarged mediastinal, hilar, or axillary lymph nodes. Thyroid gland, trachea, and esophagus demonstrate no significant findings. Lungs/Pleura: No acute airspace disease, effusion, or pneumothorax. Central airways are patent. Upper Abdomen: No acute abnormality. Musculoskeletal: No acute or destructive bony lesions. Reconstructed images demonstrate no additional findings. Review of the MIP images confirms the above findings. IMPRESSION: 1. No evidence of pulmonary embolus. 2. No acute intrathoracic process. 3. Aortic Atherosclerosis (ICD10-I70.0). Coronary artery atherosclerosis. Electronically Signed   By: Sharlet Salina M.D.   On: 10/11/2020 15:43   CARDIAC CATHETERIZATION  Result Date: 10/11/2020 Conclusions: Severe single-vessel coronary artery disease with diffuse disease involving relatively small RCA extending from the ostium through the mid/distal vessel of up to 90%.  Mild-moderate disease is also noted in the left coronary artery. Mildly elevated left ventricular filling pressure (LVEDP 15-20 mmHg). Successful placement of 52F temporary transvenous pacing wire via the right internal jugular vein. Recommendations: Recommend medical therapy of relatively small and diffusely diseased RCA, which already fills via faint left-right collaterals to the PDA. Secondary prevention of coronary artery disease. STAT portable chest radiograph when patient arrives in ICU for evaluation of temporary transvenous pacemaker placement. EP consultation with plans for permanent pacemaker tomorrow morning with Dr. Lalla Brothers. Yvonne Kendall, MD [facility]  DG Chest Port 1 View  Result Date: 10/13/2020 CLINICAL DATA:  Evaluate pacemaker in a 87 year old female. EXAM: PORTABLE CHEST 1 VIEW COMPARISON:  Comparison made with CT of the chest October 11, 2020. FINDINGS: EKG leads project over the chest. Pads for defibrillation/pacing also project over the chest. Dual lead pacer  device entering via LEFT-sided approach with power pack over the LEFT chest appears similar to the prior study. Trachea midline. Cardiomediastinal contours with top-normal heart size unchanged from previous imaging. RIGHT hemidiaphragm remains elevated. No lobar consolidation. Increased opacity at the RIGHT lung base with heterogeneous opacities throughout both RIGHT and LEFT chest showing slight increase in both of these findings compared to previous imaging. No visible pneumothorax. On limited assessment there is no acute skeletal process. IMPRESSION: Dual lead pacer device entering via LEFT-sided approach with power pack over the LEFT chest. Increasing interstitial opacities and RIGHT basilar opacity since the prior study may reflect a combination of atelectasis and worsening asymmetric edema, would also correlate with any signs of infection. Electronically Signed   By: Donzetta Kohut M.D.   On: 10/13/2020 08:02   DG Chest Port 1 View  Result Date: 10/11/2020 CLINICAL DATA:  Complete heart block, right IJ line EXAM: PORTABLE CHEST 1 VIEW COMPARISON:  10/10/2020 FINDINGS: Right IJ temporary pacemaker lead overlies the right ventricle. Elevation of the right hemidiaphragm. Increased interstitial changes. No significant pleural effusion. No pneumothorax. Normal heart size. IMPRESSION: Right IJ temporary pacemaker lead overlies right ventricle. No pneumothorax. Persistent elevation of right hemidiaphragm. Increased interstitial changes probably reflect edema. Electronically Signed   By: Guadlupe Spanish M.D.   On: 10/11/2020 19:32               LOS: 1 day   Laiylah Roettger  [facility]   Pager on [email or web address]. If 7PM-7AM, please contact night-coverage at [email or web address]     10/13/2020,  1:46 PM

## 2020-10-13 NOTE — Telephone Encounter (Signed)
Hold the 10/10 spot and we can add her on to tues if discharged on time.

## 2020-10-13 NOTE — Progress Notes (Signed)
Dr. Myriam Forehand at bedside to see patient.  Amy Calhoun is lethargic and barely responsive.  Have previously discussed with granddaughter and daughter I was holding Neurontin for sedation.  Dr. Myriam Forehand told this nurse to give her whatever she is asking for.  I explained to daughter we are trying to wake her up so she can work with therapy and get out of the hospital.  We agreed on giving patient Tylenol 650 mg for pain and we could give it every four hours.  If patient is more alert will gladly give Tylenol #3.

## 2020-10-13 NOTE — Telephone Encounter (Signed)
Patient's daughter calling in and states the Patient has been in the hospital since Monday. They had to put in a pacemaker and stated the Patient's kidneys are not doing well.   Patient is currently in the ICU with a tentative release date of this Friday. Wanting a hospital follow up next week however no one has any appointments available.   Please advise

## 2020-10-14 ENCOUNTER — Inpatient Hospital Stay: Payer: Medicare HMO

## 2020-10-14 DIAGNOSIS — N179 Acute kidney failure, unspecified: Secondary | ICD-10-CM | POA: Diagnosis not present

## 2020-10-14 DIAGNOSIS — I442 Atrioventricular block, complete: Secondary | ICD-10-CM | POA: Diagnosis not present

## 2020-10-14 LAB — BASIC METABOLIC PANEL
Anion gap: 7 (ref 5–15)
BUN: 44 mg/dL — ABNORMAL HIGH (ref 8–23)
CO2: 24 mmol/L (ref 22–32)
Calcium: 7.6 mg/dL — ABNORMAL LOW (ref 8.9–10.3)
Chloride: 106 mmol/L (ref 98–111)
Creatinine, Ser: 1.4 mg/dL — ABNORMAL HIGH (ref 0.44–1.00)
GFR, Estimated: 36 mL/min — ABNORMAL LOW (ref >60)
Glucose, Bld: 205 mg/dL — ABNORMAL HIGH (ref 70–99)
Potassium: 4.9 mmol/L (ref 3.5–5.1)
Sodium: 137 mmol/L (ref 135–145)

## 2020-10-14 LAB — CBC WITH DIFFERENTIAL/PLATELET
Abs Immature Granulocytes: 0.04 10*3/uL (ref 0.00–0.07)
Basophils Absolute: 0 10*3/uL (ref 0.0–0.1)
Basophils Relative: 0 %
Eosinophils Absolute: 0.2 10*3/uL (ref 0.0–0.5)
Eosinophils Relative: 2 %
HCT: 30.8 % — ABNORMAL LOW (ref 36.0–46.0)
Hemoglobin: 10.2 g/dL — ABNORMAL LOW (ref 12.0–15.0)
Immature Granulocytes: 0 %
Lymphocytes Relative: 20 %
Lymphs Abs: 2 10*3/uL (ref 0.7–4.0)
MCH: 30.8 pg (ref 26.0–34.0)
MCHC: 33.1 g/dL (ref 30.0–36.0)
MCV: 93.1 fL (ref 80.0–100.0)
Monocytes Absolute: 0.9 10*3/uL (ref 0.1–1.0)
Monocytes Relative: 8 %
Neutro Abs: 7.2 10*3/uL (ref 1.7–7.7)
Neutrophils Relative %: 70 %
Platelets: 140 10*3/uL — ABNORMAL LOW (ref 150–400)
RBC: 3.31 MIL/uL — ABNORMAL LOW (ref 3.87–5.11)
RDW: 13.3 % (ref 11.5–15.5)
WBC: 10.3 10*3/uL (ref 4.0–10.5)
nRBC: 0 % (ref 0.0–0.2)

## 2020-10-14 LAB — GLUCOSE, CAPILLARY
Glucose-Capillary: 160 mg/dL — ABNORMAL HIGH (ref 70–99)
Glucose-Capillary: 288 mg/dL — ABNORMAL HIGH (ref 70–99)
Glucose-Capillary: 313 mg/dL — ABNORMAL HIGH (ref 70–99)
Glucose-Capillary: 185 mg/dL — ABNORMAL HIGH (ref 70–99)

## 2020-10-14 MED ORDER — METOPROLOL TARTRATE 25 MG PO TABS
25.0000 mg | ORAL_TABLET | Freq: Two times a day (BID) | ORAL | Status: DC
  Administered 2020-10-14 – 2020-10-17 (×7): 25 mg via ORAL
  Filled 2020-10-14 (×7): qty 1

## 2020-10-14 MED ORDER — CHLORHEXIDINE GLUCONATE CLOTH 2 % EX PADS
6.0000 | MEDICATED_PAD | Freq: Every day | CUTANEOUS | Status: DC
  Administered 2020-10-16 – 2020-10-17 (×2): 6 via TOPICAL

## 2020-10-14 MED ORDER — HYDRALAZINE HCL 50 MG PO TABS
25.0000 mg | ORAL_TABLET | Freq: Four times a day (QID) | ORAL | Status: DC | PRN
  Administered 2020-10-14 – 2020-10-15 (×3): 25 mg via ORAL
  Filled 2020-10-14 (×3): qty 1

## 2020-10-14 MED ORDER — ALBUTEROL SULFATE HFA 108 (90 BASE) MCG/ACT IN AERS
1.0000 | INHALATION_SPRAY | Freq: Four times a day (QID) | RESPIRATORY_TRACT | Status: DC | PRN
  Filled 2020-10-14: qty 6.7

## 2020-10-14 MED ORDER — FUROSEMIDE 10 MG/ML IJ SOLN
20.0000 mg | Freq: Once | INTRAMUSCULAR | Status: AC
  Administered 2020-10-14: 20 mg via INTRAVENOUS
  Filled 2020-10-14: qty 2

## 2020-10-14 NOTE — Progress Notes (Signed)
Messaged prime doc regarding patients blood pressure 175/59. No PRN, awaiting for response. Continue to assess.

## 2020-10-14 NOTE — Plan of Care (Signed)
Plan of care was discussed with [name], her daughter.

## 2020-10-14 NOTE — Progress Notes (Signed)
Patients Hearing aid, glasses and clothing sent with patient room 115. Daughter called and notified.

## 2020-10-14 NOTE — Progress Notes (Signed)
BP 168/67, Per Dr. Myriam Forehand patient can be transferred to floor. He does not want to be to aggressive with patients blood pressure. Patient alert with confusion. Patient V paced. She is on room air. Pure-wick intact.Tolerating her diet. Patient up to chair this afternoon with assist. Daughter called and updated regarding tx. Continue to monitor.

## 2020-10-14 NOTE — Progress Notes (Signed)
Progress Note    Amy Calhoun  JSE:[id number] DOB: 08-Mar-1928  DOA: 10/10/2020 PCP: Allegra Grana, FNP      Brief Narrative:    Medical records reviewed and are as summarized below:  Amy Calhoun is a 87 y.o. female with medical history significant for insulin-dependent diabetes mellitus, hypertension, hyperlipidemia, hypothyroidism, GERD, who presented to the hospital because of syncope.  She was found to have intermittent high-grade AV block with up to 6-second pause on telemetry.  She underwent left heart cath with placement of transvenous pacemaker on 10/11/2020.  This was followed by placement of a permanent pacemaker on 10/12/2020.  She developed hypotension and acute kidney injury.  She was exposed to IV contrast from CTA of the chest and left heart catheterization.  Lisinopril was held and she was treated with IV fluids.   Assessment/Plan:   Principal Problem:   Syncope Active Problems:   Esophageal dysmotility   Hyperlipidemia   Hypothyroidism   HTN (hypertension), malignant   CKD (chronic kidney disease) stage 3, GFR 30-59 ml/min (HCC)   AKI (acute kidney injury) (HCC)   Gastroesophageal reflux disease without esophagitis   Fatigue   Depression   Weakness of both lower extremities   Type 2 diabetes mellitus with hyperglycemia, with long-term current use of insulin (HCC)   B12 deficiency   Abnormal gait   Symptomatic bradycardia   Heart block AV complete (HCC)   Malnutrition of moderate degree   Body mass index is 27.3 kg/m.   Complete heart block, s/p syncope: S/p placement of transvenous pacemaker on 10/11/2020 and placement of permanent pacemaker on 10/12/2020. Follow up with cardiologist.   Insulin-dependent diabetes mellitus with hyperglycemia: Continue insulin glargine and NovoLog.  Monitor glucose levels closely Hemoglobin A1c was 9.4 on 09/21/2020.  Hypotension: BP is better.  Continue to hold lisinopril.  AKI: Slowly improving.   Urine output has improved.  Discontinue IV fluids to avoid fluid overload.  Monitor BMP.   Of note, patient recently had CTA of the chest and left heart cath on 10/11/2020, significant risk for contrast-induced AKI.  Peripheral neuropathy: Analgesics including Tylenol # 3 as needed for pain  Hyperkalemia and hyponatremia: Improved.  Chronic diastolic dysfunction: 2D echo showed grade 1 diastolic dysfunction and EF estimated at 60 to 65%.  Transfer from stepdown unit to MedSurg   Diet Order             Diet Carb Modified Fluid consistency: Thin; Room service appropriate? Yes  Diet effective now                      Consultants: Cardiologist  Procedures: S/p transvenous pacemaker and subsequent permanent pacemaker placement    Medications:    amitriptyline  150 mg Oral QHS   Chlorhexidine Gluconate Cloth  6 each Topical Q0600   docusate sodium  100 mg Oral BID   ezetimibe  10 mg Oral Daily   feeding supplement (GLUCERNA SHAKE)  237 mL Oral TID BM   ferrous sulfate  325 mg Oral Daily   gabapentin  200 mg Oral BID   insulin aspart  0-15 Units Subcutaneous TID AC & HS   insulin aspart  4 Units Subcutaneous TID WC   insulin glargine-yfgn  12 Units Subcutaneous QHS   levothyroxine  150 mcg Oral Daily   multivitamin with minerals  1 tablet Oral Daily   pantoprazole  80 mg Oral Daily   Ensure Max Protein  11  oz Oral Daily   pyridOXINE  100 mg Oral Daily   sodium chloride flush  3 mL Intravenous Q12H   sodium chloride flush  3 mL Intravenous Q12H   Continuous Infusions:  sodium chloride     sodium chloride 20 mL/hr at 10/11/20 1931   promethazine (PHENERGAN) injection (IM or IVPB) Stopped (10/12/20 0446)     Anti-infectives (From admission, onward)    Start     Dose/Rate Route Frequency Ordered Stop   10/12/20 0908  ceFAZolin 1 g / gentamicin 80 mg in NS 500 mL surgical irrigation  Status:  Discontinued          As needed 10/12/20 0908 10/12/20 1024   10/12/20  0845  gentamicin (GARAMYCIN) 80 mg in sodium chloride 0.9 % 500 mL irrigation  Status:  Discontinued        80 mg Irrigation On call 10/12/20 0756 10/12/20 1024   10/12/20 0845  ceFAZolin (ANCEF) IVPB 2g/100 mL premix  Status:  Discontinued        2 g 200 mL/hr over 30 Minutes Intravenous On call 10/12/20 0756 10/12/20 1024   10/12/20 0805  ceFAZolin (ANCEF) IVPB 1 g/50 mL premix  Status:  Discontinued        over 30 Minutes  Continuous PRN 10/12/20 0805 10/12/20 1024              Family Communication/Anticipated D/C date and plan/Code Status   DVT prophylaxis: SCD's Start: 10/11/20 1839 Place TED hose Start: 10/10/20 1904     Code Status: Full Code  Family Communication: Plan discussed with her daughter and granddaughter at the bedside Disposition Plan:    Status is: Inpatient  Remains inpatient appropriate because:IV treatments appropriate due to intensity of illness or inability to take PO  Dispo: The patient is from: Home              Anticipated d/c is to: Home              Patient currently is not medically stable to d/c.   Difficult to place patient No           Subjective:   Interval events noted.  She has no complaints.  She feels better today.  Objective:    Vitals:   10/14/20 0454 10/14/20 0600 10/14/20 0800 10/14/20 1000  BP:  (!) 133/56 (!) 149/64 (!) 145/56  Pulse:  74 71 83  Resp:      Temp:   99 F (37.2 C)   TempSrc:      SpO2:  98% 98% 94%  Weight: 67.7 kg     Height:       No data found.   Intake/Output Summary (Last 24 hours) at 10/14/2020 1022 Last data filed at 10/14/2020 0847 Gross per 24 hour  Intake 2614.47 ml  Output 1718 ml  Net 896.47 ml   Filed Weights   10/11/20 1810 10/13/20 0500 10/14/20 0454  Weight: 59.7 kg 62.5 kg 67.7 kg    Exam:  GEN: NAD SKIN: Warm and dry.  Dressing on pacemaker on left upper chest is clean, dry and intact EYES: EOMI ENT: MMM CV: RRR PULM: CTA B ABD: soft, ND, NT, +BS CNS:  AAO x 3, non focal EXT: No edema or tenderness       Data Reviewed:   I have personally reviewed following labs and imaging studies:  Labs: Labs show the following:   Basic Metabolic Panel: Recent Labs  Lab 10/10/20 1624 10/11/20 6314  10/12/20 1116 10/12/20 1954 10/13/20 0454 10/14/20 0443  NA 135 137 133* 134* 137 137  K 4.5 4.2 5.7* 4.7 4.9 4.9  CL 100 100 101 101 105 106  CO2 27 27 23 25 26 24   GLUCOSE 360* 226* 475* 227* 144* 205*  BUN 37* 26* 30* 37* 42* 44*  CREATININE 1.12* 0.74 1.17* 1.50* 1.68* 1.40*  CALCIUM 8.5* 8.4* 7.9* 7.6* 7.6* 7.6*  MG 1.9  --   --  1.9 1.9  --    GFR Estimated Creatinine Clearance: 24.1 mL/min (A) (by C-G formula based on SCr of 1.4 mg/dL (H)). Liver Function Tests: Recent Labs  Lab 10/10/20 1624 10/12/20 1954 10/13/20 0454  AST 21  --  20  ALT 17  --  9  ALKPHOS 65  --  43  BILITOT 0.5  --  0.7  PROT 6.9  --  5.3*  ALBUMIN 3.9 2.7* 3.2*   No results for input(s): LIPASE, AMYLASE in the last 168 hours. No results for input(s): AMMONIA in the last 168 hours. Coagulation profile No results for input(s): INR, PROTIME in the last 168 hours.  CBC: Recent Labs  Lab 10/10/20 1624 10/11/20 0626 10/12/20 1116 10/13/20 0454 10/14/20 0443  WBC 8.6 8.1 11.8* 9.2 10.3  NEUTROABS 4.9  --   --  5.4 7.2  HGB 12.0 12.1 12.9 9.3* 10.2*  HCT 36.4 35.2* 39.3 27.8* 30.8*  MCV 92.4 91.4 91.0 92.4 93.1  PLT 184 163 172 126* 140*   Cardiac Enzymes: No results for input(s): CKTOTAL, CKMB, CKMBINDEX, TROPONINI in the last 168 hours. BNP (last 3 results) No results for input(s): PROBNP in the last 8760 hours. CBG: Recent Labs  Lab 10/13/20 0807 10/13/20 1113 10/13/20 1522 10/13/20 2134 10/14/20 0735  GLUCAP 128* 167* 153* 208* 160*   D-Dimer: No results for input(s): DDIMER in the last 72 hours.  Hgb A1c: No results for input(s): HGBA1C in the last 72 hours. Lipid Profile: No results for input(s): CHOL, HDL, LDLCALC,  TRIG, CHOLHDL, LDLDIRECT in the last 72 hours. Thyroid function studies: No results for input(s): TSH, T4TOTAL, T3FREE, THYROIDAB in the last 72 hours.  Invalid input(s): FREET3  Anemia work up: No results for input(s): VITAMINB12, FOLATE, FERRITIN, TIBC, IRON, RETICCTPCT in the last 72 hours.  Sepsis Labs: Recent Labs  Lab 10/10/20 1924 10/11/20 0626 10/12/20 1116 10/13/20 0454 10/14/20 0443  PROCALCITON <0.10  --   --   --   --   WBC  --  8.1 11.8* 9.2 10.3    Microbiology Recent Results (from the past 240 hour(s))  Resp Panel by RT-PCR (Flu A&B, Covid) Nasopharyngeal Swab     Status: None   Collection Time: 10/10/20  4:24 PM   Specimen: Nasopharyngeal Swab; Nasopharyngeal(NP) swabs in vial transport medium  Result Value Ref Range Status   SARS Coronavirus 2 by RT PCR NEGATIVE NEGATIVE Final    Comment: (NOTE) SARS-CoV-2 target nucleic acids are NOT DETECTED.  The SARS-CoV-2 RNA is generally detectable in upper respiratory specimens during the acute phase of infection. The lowest concentration of SARS-CoV-2 viral copies this assay can detect is 138 copies/mL. A negative result does not preclude SARS-Cov-2 infection and should not be used as the sole basis for treatment or other patient management decisions. A negative result may occur with  improper specimen collection/handling, submission of specimen other than nasopharyngeal swab, presence of viral mutation(s) within the areas targeted by this assay, and inadequate number of viral copies(<138 copies/mL). A negative  result must be combined with clinical observations, patient history, and epidemiological information. The expected result is Negative.  Fact Sheet for Patients:  BloggerCourse.com  Fact Sheet for Healthcare Providers:  SeriousBroker.it  This test is no t yet approved or cleared by the [place] FDA and  has been authorized for detection and/or  diagnosis of SARS-CoV-2 by FDA under an Emergency Use Authorization (EUA). This EUA will remain  in effect (meaning this test can be used) for the duration of the COVID-19 declaration under Section 564(b)(1) of the Act, 21 U.S.C.section 360bbb-3(b)(1), unless the authorization is terminated  or revoked sooner.       Influenza A by PCR NEGATIVE NEGATIVE Final   Influenza B by PCR NEGATIVE NEGATIVE Final    Comment: (NOTE) The Xpert Xpress SARS-CoV-2/FLU/RSV plus assay is intended as an aid in the diagnosis of influenza from Nasopharyngeal swab specimens and should not be used as a sole basis for treatment. Nasal washings and aspirates are unacceptable for Xpert Xpress SARS-CoV-2/FLU/RSV testing.  Fact Sheet for Patients: BloggerCourse.com  Fact Sheet for Healthcare Providers: SeriousBroker.it  This test is not yet approved or cleared by the [place] FDA and has been authorized for detection and/or diagnosis of SARS-CoV-2 by FDA under an Emergency Use Authorization (EUA). This EUA will remain in effect (meaning this test can be used) for the duration of the COVID-19 declaration under Section 564(b)(1) of the Act, 21 U.S.C. section 360bbb-3(b)(1), unless the authorization is terminated or revoked.  Performed at [facility], [address] [place], [place] [address]   MRSA Next Gen by PCR, Nasal     Status: None   Collection Time: 10/11/20  6:10 PM   Specimen: Nasal Mucosa; Nasal Swab  Result Value Ref Range Status   MRSA by PCR Next Gen NOT DETECTED NOT DETECTED Final    Comment: (NOTE) The GeneXpert MRSA Assay (FDA approved for NASAL specimens only), is one component of a comprehensive MRSA colonization surveillance program. It is not intended to diagnose MRSA infection nor to guide or monitor treatment for MRSA infections. Test performance is not FDA approved in patients less than 37 years old. Performed at  [facility], [address] [place], [place] [address]     Procedures and diagnostic studies:  DG Chest 2 View  Result Date: 10/12/2020 CLINICAL DATA:  Evaluate pacemaker EXAM: CHEST - 2 VIEW COMPARISON:  Chest x-ray dated October 11, 2020 FINDINGS: Interval placement of left chest wall dual lead pacemaker with leads overlying the expected area of the right atrium and right ventricle. Cardiac and mediastinal contours are unchanged and within normal limits. Decreased bilateral heterogeneous opacities. Lateral view is limited due to arms down position. No large pleural effusion or evidence of pneumothorax. IMPRESSION: Interval placement of left chest wall dual lead pacemaker. No evidence of pleural effusion or pneumothorax. Decreased bilateral heterogeneous pulmonary opacities, likely due to improved pulmonary edema. Electronically Signed   By: Allegra Lai M.D.   On: 10/12/2020 11:31   DG Chest Port 1 View  Result Date: 10/13/2020 CLINICAL DATA:  Evaluate pacemaker in a 87 year old female. EXAM: PORTABLE CHEST 1 VIEW COMPARISON:  Comparison made with CT of the chest October 11, 2020. FINDINGS: EKG leads project over the chest. Pads for defibrillation/pacing also project over the chest. Dual lead pacer device entering via LEFT-sided approach with power pack over the LEFT chest appears similar to the prior study. Trachea midline. Cardiomediastinal contours with top-normal heart size unchanged from previous imaging. RIGHT  hemidiaphragm remains elevated. No lobar consolidation. Increased opacity at the RIGHT lung base with heterogeneous opacities throughout both RIGHT and LEFT chest showing slight increase in both of these findings compared to previous imaging. No visible pneumothorax. On limited assessment there is no acute skeletal process. IMPRESSION: Dual lead pacer device entering via LEFT-sided approach with power pack over the LEFT chest. Increasing interstitial opacities and RIGHT  basilar opacity since the prior study may reflect a combination of atelectasis and worsening asymmetric edema, would also correlate with any signs of infection. Electronically Signed   By: Donzetta Kohut M.D.   On: 10/13/2020 08:02               LOS: 2 days   Denielle Bayard  [facility]   Pager on [email or web address]. If 7PM-7AM, please contact night-coverage at [email or web address]     10/14/2020, 10:22 AM

## 2020-10-14 NOTE — Progress Notes (Signed)
Pace maker box sent down as well.

## 2020-10-14 NOTE — Progress Notes (Signed)
Progress Note  Patient Name: Amy Calhoun Date of Encounter: 10/14/2020  Primary Cardiologist: [facility], Dr. Okey Dupre  Subjective   No chest pain or shortness of breath.    She reports some left upper chest tenderness and in the location of device implantation.  *Joined today by her daughter, [name], who requests clarification regarding the patient's device/[employer] device monitoring with representative from [employer] subsequently called --> informed RN that [employer] was contacted and daughter should receive a call from the device company later today.  Inpatient Medications    Scheduled Meds:  amitriptyline  150 mg Oral QHS   Chlorhexidine Gluconate Cloth  6 each Topical Q0600   docusate sodium  100 mg Oral BID   ezetimibe  10 mg Oral Daily   feeding supplement (GLUCERNA SHAKE)  237 mL Oral TID BM   ferrous sulfate  325 mg Oral Daily   furosemide  20 mg Intravenous Once   gabapentin  200 mg Oral BID   insulin aspart  0-15 Units Subcutaneous TID AC & HS   insulin aspart  4 Units Subcutaneous TID WC   insulin glargine-yfgn  12 Units Subcutaneous QHS   levothyroxine  150 mcg Oral Daily   metoprolol tartrate  25 mg Oral BID   multivitamin with minerals  1 tablet Oral Daily   pantoprazole  80 mg Oral Daily   Ensure Max Protein  11 oz Oral Daily   pyridOXINE  100 mg Oral Daily   sodium chloride flush  3 mL Intravenous Q12H   sodium chloride flush  3 mL Intravenous Q12H   Continuous Infusions:  sodium chloride     sodium chloride 20 mL/hr at 10/11/20 1931   promethazine (PHENERGAN) injection (IM or IVPB) Stopped (10/12/20 0446)   PRN Meds: sodium chloride, acetaminophen, acetaminophen-codeine, albuterol, albuterol, metoCLOPramide (REGLAN) injection, ondansetron (ZOFRAN) IV, promethazine (PHENERGAN) injection (IM or IVPB), sodium chloride flush   Vital Signs    Vitals:   10/14/20 0600 10/14/20 0800 10/14/20 1000 10/14/20 1200  BP: (!) 133/56 (!) 149/64 (S) (!) 145/56 (!) 160/62   Pulse: 74 71 (S) 83 87  Resp:      Temp:  99 F (37.2 C)    TempSrc:      SpO2: 98% 98% (S) 94% 96%  Weight:      Height:        Intake/Output Summary (Last 24 hours) at 10/14/2020 1338 Last data filed at 10/14/2020 1200 Gross per 24 hour  Intake 1579.52 ml  Output 2418 ml  Net -838.48 ml    Last 3 Weights 10/14/2020 10/13/2020 10/11/2020  Weight (lbs) 149 lb 4 oz 137 lb 12.6 oz 131 lb 9.8 oz  Weight (kg) 67.7 kg 62.5 kg 59.7 kg      Telemetry    Ventricular paced rhythm, 80-90s- Personally Reviewed  ECG    No new tracings - Personally Reviewed  Physical Exam   GEN: Elderly female.  No acute distress.  Joined by her daughter. Neck: No JVD Cardiac: RRR, no murmurs, rubs, or gallops.  Left upper chest area with clean, dry, intact bandage s/p PPM insertion.  Left upper extremity sling in place. Respiratory: rhonchi, coarse breath sounds bilaterally  GI: Soft, nontender, non-distended  MS: mild bilateral edema with SCDs in place; No deformity. Neuro:  Nonfocal  Psych: Normal affect   Labs    High Sensitivity Troponin:   Recent Labs  Lab 10/10/20 1624 10/10/20 1850  TROPONINIHS 8 9  Chemistry Recent Labs  Lab 10/10/20 1624 10/11/20 0626 10/12/20 1954 10/13/20 0454 10/14/20 0443  NA 135   < > 134* 137 137  K 4.5   < > 4.7 4.9 4.9  CL 100   < > 101 105 106  CO2 27   < > 25 26 24   GLUCOSE 360*   < > 227* 144* 205*  BUN 37*   < > 37* 42* 44*  CREATININE 1.12*   < > 1.50* 1.68* 1.40*  CALCIUM 8.5*   < > 7.6* 7.6* 7.6*  PROT 6.9  --   --  5.3*  --   ALBUMIN 3.9  --  2.7* 3.2*  --   AST 21  --   --  20  --   ALT 17  --   --  9  --   ALKPHOS 65  --   --  43  --   BILITOT 0.5  --   --  0.7  --   GFRNONAA 47*   < > 33* 29* 36*  ANIONGAP 8   < > 8 6 7    < > = values in this interval not displayed.      Hematology Recent Labs  Lab 10/12/20 1116 10/13/20 0454 10/14/20 0443  WBC 11.8* 9.2 10.3  RBC 4.32 3.01* 3.31*  HGB 12.9 9.3* 10.2*  HCT  39.3 27.8* 30.8*  MCV 91.0 92.4 93.1  MCH 29.9 30.9 30.8  MCHC 32.8 33.5 33.1  RDW 12.9 13.5 13.3  PLT 172 126* 140*     BNPNo results for input(s): BNP, PROBNP in the last 168 hours.   DDimer  Recent Labs  Lab 10/10/20 1624  DDIMER 0.70*      Radiology    DG Chest Port 1 View  Result Date: 10/13/2020 CLINICAL DATA:  Evaluate pacemaker in a 87 year old female. EXAM: PORTABLE CHEST 1 VIEW COMPARISON:  Comparison made with CT of the chest October 11, 2020. FINDINGS: EKG leads project over the chest. Pads for defibrillation/pacing also project over the chest. Dual lead pacer device entering via LEFT-sided approach with power pack over the LEFT chest appears similar to the prior study. Trachea midline. Cardiomediastinal contours with top-normal heart size unchanged from previous imaging. RIGHT hemidiaphragm remains elevated. No lobar consolidation. Increased opacity at the RIGHT lung base with heterogeneous opacities throughout both RIGHT and LEFT chest showing slight increase in both of these findings compared to previous imaging. No visible pneumothorax. On limited assessment there is no acute skeletal process. IMPRESSION: Dual lead pacer device entering via LEFT-sided approach with power pack over the LEFT chest. Increasing interstitial opacities and RIGHT basilar opacity since the prior study may reflect a combination of atelectasis and worsening asymmetric edema, would also correlate with any signs of infection. Electronically Signed   By: 95 M.D.   On: 10/13/2020 08:02    Cardiac Studies   LHC 10/11/20 Conclusions: Severe single-vessel coronary artery disease with diffuse disease involving relatively small RCA extending from the ostium through the mid/distal vessel of up to 90%.  Mild-moderate disease is also noted in the left coronary artery. Mildly elevated left ventricular filling pressure (LVEDP 15-20 mmHg). Successful placement of 4F temporary transvenous pacing  wire via the right internal jugular vein. Recommendations: Recommend medical therapy of relatively small and diffusely diseased RCA, which already fills via faint left-right collaterals to the PDA. Secondary prevention of coronary artery disease. STAT portable chest radiograph when patient arrives in ICU for evaluation of temporary transvenous  pacemaker placement. EP consultation with plans for permanent pacemaker tomorrow morning with Dr. Lalla Brothers.  Echo 10/11/20  1. Left ventricular ejection fraction, by estimation, is 60 to 65%. The  left ventricle has normal function. Left ventricular endocardial border  not optimally defined to evaluate regional wall motion. There is mild left  ventricular hypertrophy. Left  ventricular diastolic parameters are consistent with Grade I diastolic  dysfunction (impaired relaxation). Elevated left atrial pressure.   2. Right ventricular systolic function is normal. The right ventricular  size is normal. Tricuspid regurgitation signal is inadequate for assessing  PA pressure.   3. Left atrial size was mildly dilated.   4. The mitral valve is degenerative. Trivial mitral valve regurgitation.  No evidence of mitral stenosis. Moderate to severe mitral annular  calcification.   5. The aortic valve is tricuspid. There is mild calcification of the  aortic valve. There is mild thickening of the aortic valve. Aortic valve  regurgitation is mild. Mild to moderate aortic valve  sclerosis/calcification is present, without any evidence  of aortic stenosis.    Patient Profile     87 y.o. female with hx of insulin-dependent diabetes mellitus with peripheral neuropathy, COVID in 01/2018, unintentional 30 pound weight loss throughout the COVID pandemic, HTN, orthostatic hypotension, HLD, hypothyroidism, and GERD who is being seen today after insertion of PPM 9/28.  Assessment & Plan    Complete heart block s/p [employer] /[employer] PPM -- Reports mild tenderness around  PPM insertion but otherwise doing well. Presented to the hospital 9/27 after multiple episodes of syncope with telemetry showing pauses greater than 6 seconds/ high degree AVB. She underwent 9/27 LHC with temporary transvenous pacemaker inserted and subsequent 9/28 PPM insertion. Wound care instructions reviewed with family yesterday 9/29 and provided in AVS.  Outpatient wound check visit has been scheduled.  Reviewed she should avoid her walker and instead use her cane with her right arm for at least 1 month. PT requested by daughter and order placed today for outpatient PT.  [employer] representative contacted today at [phone] and will reach out to [facility] regarding her questions surrounding the device.  Follow-up as recommended by EP.  Severe single-vessel CAD --No chest pain.  10/11/2020 echo with EF 60 to 65%, mild LVH, elevated LAP, G1 DD.  S/p LHC 9/27 as above with severe single-vessel CAD and mild to moderate disease in the left coronary artery.  Recommend medical therapy of diffusely diseased RCA with faint left-to-right collaterals to the PDA.  Started on Lopressor 25 mg BID today.  Risk factor modification recommended, including Zetia / LDL control.  AOC HFpEF --Appears mildly volume up today after receiving IV fluids earlier in the admission.  Lisinopril was discontinued earlier due to softer pressures and orthostatic hypotension history with most recent pressures elevated.  IV Lasix 20 mg x 1 ordered today.  Chest x-ray ordered and pending.  Monitor I/os, daily weights.  Daily BMET.  Dysphagia --Reports frequent choking whenever trying to eat or drink. Consider swallow test given concern noted for aspiration pna if ongoing / if this persists with large amounts of fluid aspirated. CXR ordered and pending as above.   Deconditioning --Order placed for PT.  HTN --As above, lisinopril discontinued earlier in admission.  Given recent pressures, started metoprolol tartrate 25 mg BID.  IV Lasix  20 mg x 1 also ordered today given mild volume overload.  AKI, improving -- Kidney function slightly improved since earlier in admission.  Daily BMET. Monitor closely.  Remainder per IM  For questions or updates, please contact [facility] Please consult [email or web address] for contact info under        Signed, Lennon Alstrom, PA-C  10/14/2020, 1:38 PM

## 2020-10-14 NOTE — Plan of Care (Signed)

## 2020-10-15 ENCOUNTER — Inpatient Hospital Stay: Payer: Medicare HMO

## 2020-10-15 DIAGNOSIS — R55 Syncope and collapse: Secondary | ICD-10-CM

## 2020-10-15 DIAGNOSIS — I5033 Acute on chronic diastolic (congestive) heart failure: Secondary | ICD-10-CM | POA: Diagnosis not present

## 2020-10-15 LAB — COMPREHENSIVE METABOLIC PANEL
ALT: 59 U/L — ABNORMAL HIGH (ref 0–44)
AST: 84 U/L — ABNORMAL HIGH (ref 15–41)
Albumin: 3.5 g/dL (ref 3.5–5.0)
Alkaline Phosphatase: 74 U/L (ref 38–126)
Anion gap: 11 (ref 5–15)
BUN: 35 mg/dL — ABNORMAL HIGH (ref 8–23)
CO2: 25 mmol/L (ref 22–32)
Calcium: 8.5 mg/dL — ABNORMAL LOW (ref 8.9–10.3)
Chloride: 101 mmol/L (ref 98–111)
Creatinine, Ser: 0.92 mg/dL (ref 0.44–1.00)
GFR, Estimated: 59 mL/min — ABNORMAL LOW (ref >60)
Glucose, Bld: 235 mg/dL — ABNORMAL HIGH (ref 70–99)
Potassium: 3.9 mmol/L (ref 3.5–5.1)
Sodium: 137 mmol/L (ref 135–145)
Total Bilirubin: 1.1 mg/dL (ref 0.3–1.2)
Total Protein: 6.8 g/dL (ref 6.5–8.1)

## 2020-10-15 LAB — TROPONIN I (HIGH SENSITIVITY)
Troponin I (High Sensitivity): 31 ng/L — ABNORMAL HIGH (ref <18)
Troponin I (High Sensitivity): 77 ng/L — ABNORMAL HIGH (ref <18)

## 2020-10-15 LAB — TSH: TSH: 2.01 u[IU]/mL (ref 0.350–4.500)

## 2020-10-15 LAB — BASIC METABOLIC PANEL
Anion gap: 11 (ref 5–15)
BUN: 36 mg/dL — ABNORMAL HIGH (ref 8–23)
CO2: 25 mmol/L (ref 22–32)
Calcium: 8.2 mg/dL — ABNORMAL LOW (ref 8.9–10.3)
Chloride: 104 mmol/L (ref 98–111)
Creatinine, Ser: 1.03 mg/dL — ABNORMAL HIGH (ref 0.44–1.00)
GFR, Estimated: 52 mL/min — ABNORMAL LOW (ref >60)
Glucose, Bld: 133 mg/dL — ABNORMAL HIGH (ref 70–99)
Potassium: 4.1 mmol/L (ref 3.5–5.1)
Sodium: 140 mmol/L (ref 135–145)

## 2020-10-15 LAB — LACTIC ACID, PLASMA
Lactic Acid, Venous: 1.7 mmol/L (ref 0.5–1.9)
Lactic Acid, Venous: 1.9 mmol/L (ref 0.5–1.9)

## 2020-10-15 LAB — CBC WITH DIFFERENTIAL/PLATELET
Abs Immature Granulocytes: 0.12 10*3/uL — ABNORMAL HIGH (ref 0.00–0.07)
Basophils Absolute: 0 10*3/uL (ref 0.0–0.1)
Basophils Relative: 0 %
Eosinophils Absolute: 0.1 10*3/uL (ref 0.0–0.5)
Eosinophils Relative: 1 %
HCT: 34.6 % — ABNORMAL LOW (ref 36.0–46.0)
Hemoglobin: 11.6 g/dL — ABNORMAL LOW (ref 12.0–15.0)
Immature Granulocytes: 1 %
Lymphocytes Relative: 18 %
Lymphs Abs: 2.7 10*3/uL (ref 0.7–4.0)
MCH: 30.6 pg (ref 26.0–34.0)
MCHC: 33.5 g/dL (ref 30.0–36.0)
MCV: 91.3 fL (ref 80.0–100.0)
Monocytes Absolute: 1.1 10*3/uL — ABNORMAL HIGH (ref 0.1–1.0)
Monocytes Relative: 7 %
Neutro Abs: 10.7 10*3/uL — ABNORMAL HIGH (ref 1.7–7.7)
Neutrophils Relative %: 73 %
Platelets: 170 10*3/uL (ref 150–400)
RBC: 3.79 MIL/uL — ABNORMAL LOW (ref 3.87–5.11)
RDW: 13.4 % (ref 11.5–15.5)
WBC: 14.7 10*3/uL — ABNORMAL HIGH (ref 4.0–10.5)
nRBC: 0 % (ref 0.0–0.2)

## 2020-10-15 LAB — GLUCOSE, CAPILLARY
Glucose-Capillary: 185 mg/dL — ABNORMAL HIGH (ref 70–99)
Glucose-Capillary: 204 mg/dL — ABNORMAL HIGH (ref 70–99)
Glucose-Capillary: 285 mg/dL — ABNORMAL HIGH (ref 70–99)

## 2020-10-15 LAB — BRAIN NATRIURETIC PEPTIDE: B Natriuretic Peptide: 705.3 pg/mL — ABNORMAL HIGH (ref 0.0–100.0)

## 2020-10-15 LAB — PROCALCITONIN: Procalcitonin: <0.1 ng/mL

## 2020-10-15 MED ORDER — FUROSEMIDE 10 MG/ML IJ SOLN
20.0000 mg | Freq: Once | INTRAMUSCULAR | Status: AC
  Administered 2020-10-15: 18:00:00 20 mg via INTRAVENOUS
  Filled 2020-10-15: qty 2

## 2020-10-15 MED ORDER — FUROSEMIDE 10 MG/ML IJ SOLN
INTRAMUSCULAR | Status: AC
  Administered 2020-10-15: 11:00:00 40 mg
  Filled 2020-10-15: qty 4

## 2020-10-15 MED ORDER — FUROSEMIDE 10 MG/ML IJ SOLN: 40.0000 mg | Freq: Once | INTRAMUSCULAR | Status: AC

## 2020-10-15 MED ORDER — LISINOPRIL 5 MG PO TABS
2.5000 mg | ORAL_TABLET | Freq: Every day | ORAL | Status: DC
  Administered 2020-10-15 – 2020-10-17 (×3): 2.5 mg via ORAL
  Filled 2020-10-15 (×3): qty 1

## 2020-10-15 MED ORDER — MORPHINE SULFATE (PF) 2 MG/ML IV SOLN
2.0000 mg | Freq: Once | INTRAVENOUS | Status: AC
Start: 2020-10-15 — End: 2020-10-15

## 2020-10-15 MED ORDER — AMLODIPINE BESYLATE 5 MG PO TABS
5.0000 mg | ORAL_TABLET | Freq: Every day | ORAL | Status: DC
  Administered 2020-10-15: 5 mg via ORAL
  Filled 2020-10-15: qty 1

## 2020-10-15 MED ORDER — MORPHINE SULFATE (PF) 2 MG/ML IV SOLN
INTRAVENOUS | Status: AC
  Administered 2020-10-15: 2 mg via INTRAVENOUS
  Filled 2020-10-15: qty 1

## 2020-10-15 NOTE — TOC Progression Note (Signed)
Transition of Care [facility]) - Progression Note    Patient Details  Name: Amy Calhoun MRN: [id number] Date of Birth: Sep 22, 1928  Transition of [facility]) CM/SW Contact  Caryn Section, RN Phone Number: 10/15/2020, 3:59 PM  Clinical Narrative:   TOC left message with daughter, patient sleeping after rapid response.  Awaiting call back from daughter.         Expected Discharge Plan and Services                                                 Social Determinants of Health (SDOH) Interventions    Readmission Risk Interventions No flowsheet data found.

## 2020-10-15 NOTE — Progress Notes (Addendum)
Progress Note    Amy Calhoun  DXI:[id number] DOB: [birth year]  DOA: 10/10/2020 PCP: Allegra Grana, FNP      Brief Narrative:    Medical records reviewed and are as summarized below:  Amy Calhoun is a 87 y.o. female with medical history significant for insulin-dependent diabetes mellitus, hypertension, hyperlipidemia, hypothyroidism, GERD, who presented to the hospital because of syncope.  She was found to have intermittent high-grade AV block with up to 6-second pause on telemetry.  She underwent left heart cath with placement of transvenous pacemaker on 10/11/2020.  This was followed by placement of a permanent pacemaker on 10/12/2020.  She developed hypotension and acute kidney injury.  She was exposed to IV contrast from CTA of the chest and left heart catheterization.  Lisinopril was held and she was treated with IV fluids.  She developed acute on chronic diastolic CHF requiring treatment with IV Lasix.    Assessment/Plan:   Principal Problem:   Syncope Active Problems:   Esophageal dysmotility   Hyperlipidemia   Hypothyroidism   HTN (hypertension), malignant   CKD (chronic kidney disease) stage 3, GFR 30-59 ml/min (HCC)   AKI (acute kidney injury) (HCC)   Gastroesophageal reflux disease without esophagitis   Fatigue   Depression   Weakness of both lower extremities   Type 2 diabetes mellitus with hyperglycemia, with long-term current use of insulin (HCC)   B12 deficiency   Abnormal gait   Symptomatic bradycardia   Complete heart block (HCC)   Malnutrition of moderate degree   Acute on chronic diastolic CHF (congestive heart failure) (HCC)   Body mass index is 27.3 kg/m.   Complete heart block, s/p syncope: S/p placement of transvenous pacemaker on 10/11/2020 and placement of permanent pacemaker on 10/12/2020. Follow up with cardiologist.   Chest pain, acute on chronic diastolic CHF: She was given 40 mg of IV Lasix and 2 mg of IV morphine this  morning for acute CHF and chest pain.  Chest x-ray ordered this morning showed infiltrates likely from pulmonary edema.  EKG showed ventricular-paced rhythm.  Lactic acid and procalcitonin levels were normal. Initial troponin was 31.  Repeat troponin is pending.  Hypertensive urgency: Add amlodipine.  Resume home dose of low-dose lisinopril.  Continue metoprolol.  Use oral hydralazine as needed for severe hypertension.  AKI: Improved. Of note, patient recently had CTA of the chest and left heart cath on 10/11/2020  Insulin-dependent diabetes mellitus with hyperglycemia: Continue insulin glargine and NovoLog.  Monitor glucose levels closely Hemoglobin A1c was 9.4 on 09/21/2020.  Peripheral neuropathy: Analgesics including Tylenol # 3 as needed for pain  Hyperkalemia and hyponatremia: Improved.  CODE STATUS was discussed with patient's daughter.  Patient remains a full code at this time.   Diet Order             DIET DYS 3 Room service appropriate? Yes; Fluid consistency: Thin  Diet effective now                      Consultants: Cardiologist  Procedures: S/p transvenous pacemaker and subsequent permanent pacemaker placement    Medications:    amitriptyline  150 mg Oral QHS   amLODipine  5 mg Oral Daily   Chlorhexidine Gluconate Cloth  6 each Topical Q0600   docusate sodium  100 mg Oral BID   ezetimibe  10 mg Oral Daily   feeding supplement (GLUCERNA SHAKE)  237 mL Oral TID BM   ferrous  sulfate  325 mg Oral Daily   gabapentin  200 mg Oral BID   insulin aspart  0-15 Units Subcutaneous TID AC & HS   insulin aspart  4 Units Subcutaneous TID WC   insulin glargine-yfgn  12 Units Subcutaneous QHS   levothyroxine  150 mcg Oral Daily   lisinopril  2.5 mg Oral Daily   metoprolol tartrate  25 mg Oral BID   multivitamin with minerals  1 tablet Oral Daily   pantoprazole  80 mg Oral Daily   Ensure Max Protein  11 oz Oral Daily   pyridOXINE  100 mg Oral Daily   sodium  chloride flush  3 mL Intravenous Q12H   sodium chloride flush  3 mL Intravenous Q12H   Continuous Infusions:  sodium chloride     sodium chloride 20 mL/hr at 10/11/20 1931   promethazine (PHENERGAN) injection (IM or IVPB) Stopped (10/12/20 0446)     Anti-infectives (From admission, onward)    Start     Dose/Rate Route Frequency Ordered Stop   10/12/20 0908  ceFAZolin 1 g / gentamicin 80 mg in NS 500 mL surgical irrigation  Status:  Discontinued          As needed 10/12/20 0908 10/12/20 1024   10/12/20 0845  gentamicin (GARAMYCIN) 80 mg in sodium chloride 0.9 % 500 mL irrigation  Status:  Discontinued        80 mg Irrigation On call 10/12/20 0756 10/12/20 1024   10/12/20 0845  ceFAZolin (ANCEF) IVPB 2g/100 mL premix  Status:  Discontinued        2 g 200 mL/hr over 30 Minutes Intravenous On call 10/12/20 0756 10/12/20 1024   10/12/20 0805  ceFAZolin (ANCEF) IVPB 1 g/50 mL premix  Status:  Discontinued        over 30 Minutes  Continuous PRN 10/12/20 0805 10/12/20 1024              Family Communication/Anticipated D/C date and plan/Code Status   DVT prophylaxis: SCD's Start: 10/11/20 1839 Place TED hose Start: 10/10/20 1904     Code Status: Full Code  Family Communication: Plan discussed with her daughter, [name] Disposition Plan:    Status is: Inpatient  Remains inpatient appropriate because:IV treatments appropriate due to intensity of illness or inability to take PO  Dispo: The patient is from: Home              Anticipated d/c is to: Home              Patient currently is not medically stable to d/c.   Difficult to place patient No           Subjective:   Interval events noted.  Patient complained of chest pain and shortness of breath.  Objective:    Vitals:   10/15/20 0836 10/15/20 1118 10/15/20 1230 10/15/20 1338  BP: (!) 185/76 (!) 203/86 (!) 179/113 (!) 162/57  Pulse: 75  79 66  Resp: 16 (!) 22 18   Temp: 98 F (36.7 C)  98.6 F (37 C)  98.1 F (36.7 C)  TempSrc:   Oral Oral  SpO2: 92% 93% 97% 100%  Weight:      Height:       No data found.   Intake/Output Summary (Last 24 hours) at 10/15/2020 1421 Last data filed at 10/15/2020 1342 Gross per 24 hour  Intake 237 ml  Output 1400 ml  Net -1163 ml   Filed Weights   10/11/20 1810 10/13/20  0500 10/14/20 0454  Weight: 59.7 kg 62.5 kg 67.7 kg    Exam:  GEN: NAD SKIN: Warm and dry.  Dressing on pacemaker on left upper chest is clean, dry and intact EYES: EOMI ENT: MMM CV: RRR PULM: Bibasilar rales, no wheezing ABD: soft, ND, NT, +BS CNS: AAO x 3, non focal EXT: No edema or tenderness        Data Reviewed:   I have personally reviewed following labs and imaging studies:  Labs: Labs show the following:   Basic Metabolic Panel: Recent Labs  Lab 10/10/20 1624 10/11/20 0626 10/12/20 1954 10/13/20 0454 10/14/20 0443 10/15/20 0453 10/15/20 1106  NA 135   < > 134* 137 137 140 137  K 4.5   < > 4.7 4.9 4.9 4.1 3.9  CL 100   < > 101 105 106 104 101  CO2 27   < > 25 26 24 25 25   GLUCOSE 360*   < > 227* 144* 205* 133* 235*  BUN 37*   < > 37* 42* 44* 36* 35*  CREATININE 1.12*   < > 1.50* 1.68* 1.40* 1.03* 0.92  CALCIUM 8.5*   < > 7.6* 7.6* 7.6* 8.2* 8.5*  MG 1.9  --  1.9 1.9  --   --   --    < > = values in this interval not displayed.   GFR Estimated Creatinine Clearance: 36.6 mL/min (by C-G formula based on SCr of 0.92 mg/dL). Liver Function Tests: Recent Labs  Lab 10/10/20 1624 10/12/20 1954 10/13/20 0454 10/15/20 1106  AST 21  --  20 84*  ALT 17  --  9 59*  ALKPHOS 65  --  43 74  BILITOT 0.5  --  0.7 1.1  PROT 6.9  --  5.3* 6.8  ALBUMIN 3.9 2.7* 3.2* 3.5   No results for input(s): LIPASE, AMYLASE in the last 168 hours. No results for input(s): AMMONIA in the last 168 hours. Coagulation profile No results for input(s): INR, PROTIME in the last 168 hours.  CBC: Recent Labs  Lab 10/10/20 1624 10/11/20 0626 10/12/20 1116  10/13/20 0454 10/14/20 0443 10/15/20 1106  WBC 8.6 8.1 11.8* 9.2 10.3 14.7*  NEUTROABS 4.9  --   --  5.4 7.2 10.7*  HGB 12.0 12.1 12.9 9.3* 10.2* 11.6*  HCT 36.4 35.2* 39.3 27.8* 30.8* 34.6*  MCV 92.4 91.4 91.0 92.4 93.1 91.3  PLT 184 163 172 126* 140* 170   Cardiac Enzymes: No results for input(s): CKTOTAL, CKMB, CKMBINDEX, TROPONINI in the last 168 hours. BNP (last 3 results) No results for input(s): PROBNP in the last 8760 hours. CBG: Recent Labs  Lab 10/14/20 0735 10/14/20 1218 10/14/20 1553 10/14/20 2045 10/15/20 1139  GLUCAP 160* 288* 313* 185* 185*   D-Dimer: No results for input(s): DDIMER in the last 72 hours.  Hgb A1c: No results for input(s): HGBA1C in the last 72 hours. Lipid Profile: No results for input(s): CHOL, HDL, LDLCALC, TRIG, CHOLHDL, LDLDIRECT in the last 72 hours. Thyroid function studies: No results for input(s): TSH, T4TOTAL, T3FREE, THYROIDAB in the last 72 hours.  Invalid input(s): FREET3  Anemia work up: No results for input(s): VITAMINB12, FOLATE, FERRITIN, TIBC, IRON, RETICCTPCT in the last 72 hours.  Sepsis Labs: Recent Labs  Lab 10/10/20 1924 10/11/20 0626 10/12/20 1116 10/13/20 0454 10/14/20 0443 10/15/20 1106 10/15/20 1339  PROCALCITON <0.10  --   --   --   --  <0.10  --   WBC  --    < >  11.8* 9.2 10.3 14.7*  --   LATICACIDVEN  --   --   --   --   --  1.7 1.9   < > = values in this interval not displayed.    Microbiology Recent Results (from the past 240 hour(s))  Resp Panel by RT-PCR (Flu A&B, Covid) Nasopharyngeal Swab     Status: None   Collection Time: 10/10/20  4:24 PM   Specimen: Nasopharyngeal Swab; Nasopharyngeal(NP) swabs in vial transport medium  Result Value Ref Range Status   SARS Coronavirus 2 by RT PCR NEGATIVE NEGATIVE Final    Comment: (NOTE) SARS-CoV-2 target nucleic acids are NOT DETECTED.  The SARS-CoV-2 RNA is generally detectable in upper respiratory specimens during the acute phase of infection.  The lowest concentration of SARS-CoV-2 viral copies this assay can detect is 138 copies/mL. A negative result does not preclude SARS-Cov-2 infection and should not be used as the sole basis for treatment or other patient management decisions. A negative result may occur with  improper specimen collection/handling, submission of specimen other than nasopharyngeal swab, presence of viral mutation(s) within the areas targeted by this assay, and inadequate number of viral copies(<138 copies/mL). A negative result must be combined with clinical observations, patient history, and epidemiological information. The expected result is Negative.  Fact Sheet for Patients:  BloggerCourse.com  Fact Sheet for Healthcare Providers:  SeriousBroker.it  This test is no t yet approved or cleared by the [place] FDA and  has been authorized for detection and/or diagnosis of SARS-CoV-2 by FDA under an Emergency Use Authorization (EUA). This EUA will remain  in effect (meaning this test can be used) for the duration of the COVID-19 declaration under Section 564(b)(1) of the Act, 21 U.S.C.section 360bbb-3(b)(1), unless the authorization is terminated  or revoked sooner.       Influenza A by PCR NEGATIVE NEGATIVE Final   Influenza B by PCR NEGATIVE NEGATIVE Final    Comment: (NOTE) The Xpert Xpress SARS-CoV-2/FLU/RSV plus assay is intended as an aid in the diagnosis of influenza from Nasopharyngeal swab specimens and should not be used as a sole basis for treatment. Nasal washings and aspirates are unacceptable for Xpert Xpress SARS-CoV-2/FLU/RSV testing.  Fact Sheet for Patients: BloggerCourse.com  Fact Sheet for Healthcare Providers: SeriousBroker.it  This test is not yet approved or cleared by the [place] FDA and has been authorized for detection and/or diagnosis of SARS-CoV-2 by FDA  under an Emergency Use Authorization (EUA). This EUA will remain in effect (meaning this test can be used) for the duration of the COVID-19 declaration under Section 564(b)(1) of the Act, 21 U.S.C. section 360bbb-3(b)(1), unless the authorization is terminated or revoked.  Performed at [facility], [address] [place], [place] [address]   MRSA Next Gen by PCR, Nasal     Status: None   Collection Time: 10/11/20  6:10 PM   Specimen: Nasal Mucosa; Nasal Swab  Result Value Ref Range Status   MRSA by PCR Next Gen NOT DETECTED NOT DETECTED Final    Comment: (NOTE) The GeneXpert MRSA Assay (FDA approved for NASAL specimens only), is one component of a comprehensive MRSA colonization surveillance program. It is not intended to diagnose MRSA infection nor to guide or monitor treatment for MRSA infections. Test performance is not FDA approved in patients less than 75 years old. Performed at [facility], [address], [place], [place] [address]     Procedures and diagnostic studies:  DG Chest [place] 1  View  Result Date: 10/15/2020 CLINICAL DATA:  Post rapid response. EXAM: PORTABLE CHEST 1 VIEW COMPARISON:  Chest radiograph October 14, 2020. FINDINGS: Multi lead pacer apparatus overlies the left hemithorax. Monitoring leads overlie the patient. Stable cardiac and mediastinal contours. Interval increase in diffuse bilateral airspace opacities. Elevation right hemidiaphragm. Small layering right pleural effusion. No definite pneumothorax. Thoracic spine degenerative changes. IMPRESSION: Worsening bilateral airspace opacities which may represent pulmonary edema versus infection. Probable layering right pleural effusion. Electronically Signed   By: Annia Belt M.D.   On: 10/15/2020 11:32   DG Chest Port 1 View  Result Date: 10/14/2020 CLINICAL DATA:  Cough.  Syncope. EXAM: PORTABLE CHEST 1 VIEW COMPARISON:  10/13/2020 FINDINGS: A dual lead pacemaker remains in place. The  cardiac silhouette remains borderline enlarged. There is unchanged elevation of the right hemidiaphragm. The interstitial markings remain mildly increased diffusely, and there are unchanged right greater than left basilar airspace opacities. No large pleural effusion or pneumothorax is identified. IMPRESSION: Unchanged right greater than left lung opacities which may reflect a combination of atelectasis and mild edema versus infection. Electronically Signed   By: Sebastian Ache M.D.   On: 10/14/2020 15:34               LOS: 3 days   Giulio Bertino  [facility]   Pager on [email or web address]. If 7PM-7AM, please contact night-coverage at [email or web address]     10/15/2020, 2:21 PM

## 2020-10-15 NOTE — Progress Notes (Signed)
   10/14/20 2021  Vitals  Temp 97.6 F (36.4 C)  BP (!) 180/66  MAP (mmHg) 99  BP Location Right Arm  BP Method Automatic  Patient Position (if appropriate) Lying  Pulse Rate 81  Pulse Rate Source Monitor  Resp 18  MEWS COLOR  MEWS Score Color Yellow  Oxygen Therapy  SpO2 99 %  O2 Device Room Air  Pain Assessment  Pain Scale 0-10  Pain Score 0  MEWS Score  MEWS Temp 0  MEWS Systolic 0  MEWS Pulse 0  MEWS RR 0  MEWS LOC 2  MEWS Score 2  Provider Notification  Provider Name/Title Elveria Rising -CN  Date Provider Notified 10/14/20  Time Provider Notified 2030  Notification Type Face-to-face  Notification Reason Other (Comment) (elev BP)  Provider response No new orders (prn BP to be given)  Note  Observations nad noted, alert , resting in bed, HOb elev, bed alarm on -  PRN hydralazine was given.

## 2020-10-15 NOTE — Progress Notes (Signed)
Pt calling for help c/o sob.  Respiratory called, nebulizer treatment done.  Pt reported feeling "a bit better".  Pt continues to call for help, now endorsing crushing chest pain, pt agitated, restless.  MD notified, rapid response called, ekg performed showing v-pacing, morphine 2mg  administered for pain, cxr performed showing b/l opacities in lower lobes R>L, administered lasix 40mg , foley catheter placed with initial output, pt placed on 2L O2 via [place].  Pt reports feeling better.  Approx 45 minutes of critical care provided to pt.

## 2020-10-15 NOTE — Significant Event (Signed)
Rapid Response Event Note   Reason for Call : called rapid response for CP, breathing difficulty...   Initial Focused Assessment: Laying in bed, post op from pacemaker placement. C/O CP, and noted audible coarse breath sounds.      Interventions: Dr Myriam Forehand to bedside. 2L [place], CXR, EKG, lasix and morphine given, foley placed.   Plan of Care: as above, Dr Myriam Forehand awaiting pts daughter for discussion. Carney Bern, RN and Melina Schools charge RN to notify if further assistance needed.    Event Summary: as above  MD Notified: [place] 1045 Call Time:1040 Arrival Time:1045 End Time:1120  Roselee Tayloe A, RN

## 2020-10-15 NOTE — Progress Notes (Signed)
Chaplain responded to Rapid Response. No family present. Pt unavailable. Continued support available per on call chaplain.

## 2020-10-16 LAB — CBC WITH DIFFERENTIAL/PLATELET
Abs Immature Granulocytes: 0.05 10*3/uL (ref 0.00–0.07)
Basophils Absolute: 0 10*3/uL (ref 0.0–0.1)
Basophils Relative: 0 %
Eosinophils Absolute: 0.2 10*3/uL (ref 0.0–0.5)
Eosinophils Relative: 2 %
HCT: 28.3 % — ABNORMAL LOW (ref 36.0–46.0)
Hemoglobin: 9.5 g/dL — ABNORMAL LOW (ref 12.0–15.0)
Immature Granulocytes: 1 %
Lymphocytes Relative: 25 %
Lymphs Abs: 2.4 10*3/uL (ref 0.7–4.0)
MCH: 30.5 pg (ref 26.0–34.0)
MCHC: 33.6 g/dL (ref 30.0–36.0)
MCV: 91 fL (ref 80.0–100.0)
Monocytes Absolute: 1.2 10*3/uL — ABNORMAL HIGH (ref 0.1–1.0)
Monocytes Relative: 12 %
Neutro Abs: 5.9 10*3/uL (ref 1.7–7.7)
Neutrophils Relative %: 60 %
Platelets: 151 10*3/uL (ref 150–400)
RBC: 3.11 MIL/uL — ABNORMAL LOW (ref 3.87–5.11)
RDW: 13.5 % (ref 11.5–15.5)
WBC: 9.7 10*3/uL (ref 4.0–10.5)
nRBC: 0 % (ref 0.0–0.2)

## 2020-10-16 LAB — BASIC METABOLIC PANEL
Anion gap: 6 (ref 5–15)
BUN: 42 mg/dL — ABNORMAL HIGH (ref 8–23)
CO2: 29 mmol/L (ref 22–32)
Calcium: 7.7 mg/dL — ABNORMAL LOW (ref 8.9–10.3)
Chloride: 101 mmol/L (ref 98–111)
Creatinine, Ser: 1.14 mg/dL — ABNORMAL HIGH (ref 0.44–1.00)
GFR, Estimated: 46 mL/min — ABNORMAL LOW (ref >60)
Glucose, Bld: 131 mg/dL — ABNORMAL HIGH (ref 70–99)
Potassium: 3.7 mmol/L (ref 3.5–5.1)
Sodium: 136 mmol/L (ref 135–145)

## 2020-10-16 LAB — GLUCOSE, CAPILLARY
Glucose-Capillary: 146 mg/dL — ABNORMAL HIGH (ref 70–99)
Glucose-Capillary: 201 mg/dL — ABNORMAL HIGH (ref 70–99)
Glucose-Capillary: 257 mg/dL — ABNORMAL HIGH (ref 70–99)
Glucose-Capillary: 256 mg/dL — ABNORMAL HIGH (ref 70–99)

## 2020-10-16 LAB — MAGNESIUM: Magnesium: 1.9 mg/dL (ref 1.7–2.4)

## 2020-10-16 LAB — PROCALCITONIN: Procalcitonin: 0.1 ng/mL

## 2020-10-16 MED ORDER — AMLODIPINE BESYLATE 5 MG PO TABS
2.5000 mg | ORAL_TABLET | Freq: Every day | ORAL | Status: DC
  Administered 2020-10-16 – 2020-10-17 (×2): 2.5 mg via ORAL
  Filled 2020-10-16 (×2): qty 1

## 2020-10-16 MED ORDER — FUROSEMIDE 10 MG/ML IJ SOLN
20.0000 mg | Freq: Once | INTRAMUSCULAR | Status: AC
  Administered 2020-10-16: 10:00:00 20 mg via INTRAVENOUS
  Filled 2020-10-16: qty 2

## 2020-10-16 NOTE — Progress Notes (Signed)
Per Dr Myriam Forehand ok to dc tele monitoring on pt

## 2020-10-16 NOTE — Progress Notes (Signed)
Physical Therapy Treatment Patient Details Name: Amy Calhoun MRN: [id number] DOB: June 30, 1928 Today's Date: 10/16/2020   History of Present Illness 87 y.o. female with medical history significant for insulin-dependent diabetes mellitus, hypertension, hyperlipidemia, hypothyroidism, GERD, who presented to the hospital because of syncope.     She was found to have intermittent high-grade AV block with up to 6-second pause on telemetry.  She underwent left heart cath with placement of transvenous pacemaker on 10/11/2020.  This was followed by placement of a permanent pacemaker on 10/12/2020.    PT Comments    Pt tolerated treatment well today, able to improve overall ambulation distance and assist levels since last session. Able to use RW for ambulation and 5x STS but had increased complaints of L shoulder pain due to increased WB through LUE. Continues to require increased cueing for safety and sequencing. Daughter at bedside and very involved in pt care. Pt and daughter verbalized understanding of all education/precautions. Able to maintain SpO2 >90% throughout session on RA. Pt will continue to benefit from skilled acute PT services to address deficits for return to baseline function. Will continue to recommend HHPT with 24/7 care at DC.     Recommendations for follow up therapy are one component of a multi-disciplinary discharge planning process, led by the attending physician.  Recommendations may be updated based on patient status, additional functional criteria and insurance authorization.  Follow Up Recommendations  Home health PT;Supervision/Assistance - 24 hour     Equipment Recommendations  None recommended by PT       Precautions / Restrictions Precautions Precautions: Fall Restrictions Weight Bearing Restrictions: Yes LUE Weight Bearing: Non weight bearing Other Position/Activity Restrictions: can use walker per pacer precautions     Mobility  Bed Mobility                General bed mobility comments: seated EOB upon entry with RN and daughter    Transfers Overall transfer level: Needs assistance Equipment used: Rolling walker (2 wheeled) Transfers: Sit to/from Stand Sit to Stand: Min assist         General transfer comment: for power to stand with RW, cues for safety and hand placement  Ambulation/Gait Ambulation/Gait assistance: Min guard Gait Distance (Feet): 20 Feet Assistive device: Rolling walker (2 wheeled)       General Gait Details: demonstrates decreased step length/foot clearance bil, decreased RW proximity, decreased RW negotiation, narrow BOS, and poor safety awareness. Verbal cues for safety/sequencing and RW proximity. SpO2 >90% throughout on RA.     Balance Overall balance assessment: Needs assistance Sitting-balance support: Single extremity supported Sitting balance-Leahy Scale: Fair       Standing balance-Leahy Scale: Fair Standing balance comment: BUE support on RW        Cognition Arousal/Alertness: Awake/alert Behavior During Therapy: Flat affect Overall Cognitive Status: Impaired/Different from baseline            General Comments: baseline confusion and HOH      Exercises Other Exercises Other Exercises: transfers, gait, and x5 STS with RW; CGA-min assist with cues for safety and sequencing. Other Exercises: Able to perform x15 BLE therex including: ankle pumps, heel slides, LAQ, seated marching, glute sets, quad sets, isometric hip ADD, and hip ABD/ADD. Cues for sequencing. Other Exercises: Pt and daughter educated re: PT role/POC, DC recommendations, pacer precautions, use of RW, sling management, and SpO2.        Pertinent Vitals/Pain Pain Assessment: Faces Faces Pain Scale: Hurts a little bit Pain Location:  R shoulder after using RW Pain Intervention(s): Limited activity within patient's tolerance;Monitored during session;Repositioned (sling donned)     PT Goals (current goals can now be  found in the care plan section) Acute Rehab PT Goals Patient Stated Goal: return to her facility PT Goal Formulation: With patient/family Time For Goal Achievement: 10/27/20 Potential to Achieve Goals: Fair Progress towards PT goals: Progressing toward goals    Frequency    Min 2X/week      PT Plan Current plan remains appropriate    AM-PAC PT "6 Clicks" Mobility   Outcome Measure  Help needed turning from your back to your side while in a flat bed without using bedrails?: A Little Help needed moving from lying on your back to sitting on the side of a flat bed without using bedrails?: A Little Help needed moving to and from a bed to a chair (including a wheelchair)?: A Little Help needed standing up from a chair using your arms (e.g., wheelchair or bedside chair)?: A Little Help needed to walk in hospital room?: A Little Help needed climbing 3-5 steps with a railing? : A Lot 6 Click Score: 17    End of Session Equipment Utilized During Treatment: Gait belt Activity Tolerance: Patient tolerated treatment well Patient left: in chair;with call bell/phone within reach;with nursing/sitter in room;with family/visitor present Nurse Communication: Mobility status PT Visit Diagnosis: Muscle weakness (generalized) (M62.81);Difficulty in walking, not elsewhere classified (R26.2);Unsteadiness on feet (R26.81);Pain Pain - Right/Left: Left Pain - part of body: Shoulder     Time: 6967-8938 PT Time Calculation (min) (ACUTE ONLY): 33 min  Charges:  $Therapeutic Exercise: 8-22 mins $Therapeutic Activity: 8-22 mins                     Vira Blanco, PT, DPT 12:42 PM,10/16/20

## 2020-10-16 NOTE — TOC Progression Note (Addendum)
Transition of Care [facility]) - Progression Note    Patient Details  Name: Amy Calhoun MRN: [id number] Date of Birth: [birth year]  Transition of Care [facility]) CM/SW [place], LCSW Phone Number: 10/16/2020, 12:35 PM  Clinical Narrative:   CSW met with patient and daughter/POA Amy Calhoun at bedside. Patient lives alone at [place]. PCP is Mable Paris, FNP. Pharmacy is [facility] or [facility]. Patient has a RW, cane, and scooter at home. Daughter provides transportation.   CSW explained PT recs for HHPT. [name] stated patient had PT in the past through [facility], that someone came from [facility] to patient's apartment to do therapy. CSW called [facility], spoke with Receptionist who confirmed they do this. She said TOC will have to call Monday to see if this can be arranged for patient again- need to ask for Therapy Director Aggie Hacker. Will leave handoff for weekday TOC to follow up. [name] also inquired about getting [facility] Aide services to help with personal care. Will need to see if [facility] can do this as well. Otherwise, can arrange HHPT and Aide through another [facility] agency.  [name] also had questions about patient's Psychologist, forensic. Notified MD and RN.         Expected Discharge Plan and Services                                                 Social Determinants of Health (SDOH) Interventions    Readmission Risk Interventions No flowsheet data found.

## 2020-10-16 NOTE — Progress Notes (Signed)
Progress Note    Amy Calhoun  ZOX:[id number] DOB: Jun 28, 1928  DOA: 10/10/2020 PCP: Allegra Grana, FNP      Brief Narrative:    Medical records reviewed and are as summarized below:  Amy Calhoun is a 87 y.o. female with medical history significant for insulin-dependent diabetes mellitus, hypertension, hyperlipidemia, hypothyroidism, GERD, who presented to the hospital because of syncope.  She was found to have intermittent high-grade AV block with up to 6-second pause on telemetry.  She underwent left heart cath with placement of transvenous pacemaker on 10/11/2020.  This was followed by placement of a permanent pacemaker on 10/12/2020.  She developed hypotension and acute kidney injury.  She was exposed to IV contrast from CTA of the chest and left heart catheterization.  Lisinopril was held and she was treated with IV fluids.  She developed acute on chronic diastolic CHF requiring treatment with IV Lasix.    Assessment/Plan:   Principal Problem:   Syncope Active Problems:   Esophageal dysmotility   Hyperlipidemia   Hypothyroidism   HTN (hypertension), malignant   CKD (chronic kidney disease) stage 3, GFR 30-59 ml/min (HCC)   AKI (acute kidney injury) (HCC)   Gastroesophageal reflux disease without esophagitis   Fatigue   Depression   Weakness of both lower extremities   Type 2 diabetes mellitus with hyperglycemia, with long-term current use of insulin (HCC)   B12 deficiency   Abnormal gait   Symptomatic bradycardia   Complete heart block (HCC)   Malnutrition of moderate degree   Acute on chronic diastolic CHF (congestive heart failure) (HCC)   Body mass index is 27.34 kg/m.   Complete heart block, s/p syncope: S/p placement of transvenous pacemaker on 10/11/2020 and placement of permanent pacemaker on 10/12/2020. Follow up with cardiologist.   Chest pain, acute on chronic diastolic CHF: Chest pain has resolved.  This was probably from acute CHF.   Continue IV Lasix and monitor BMP.  Patient had significant urine output with IV Lasix.  Elevated troponins: This is likely from demand ischemia.  Hypertensive urgency/hypertension: Continue antihypertensives  Insulin-dependent diabetes mellitus with hyperglycemia: Continue insulin glargine and NovoLog.  Monitor glucose levels closely Hemoglobin A1c was 9.4 on 09/21/2020.  Peripheral neuropathy: Analgesics including Tylenol # 3 as needed for pain  AKI, hyperkalemia and hyponatremia: Improved.  Possible discharge to home tomorrow  Diet Order             DIET DYS 3 Room service appropriate? Yes; Fluid consistency: Thin  Diet effective now                      Consultants: Cardiologist  Procedures: S/p transvenous pacemaker and subsequent permanent pacemaker placement    Medications:    amitriptyline  150 mg Oral QHS   amLODipine  2.5 mg Oral Daily   Chlorhexidine Gluconate Cloth  6 each Topical Q0600   docusate sodium  100 mg Oral BID   ezetimibe  10 mg Oral Daily   feeding supplement (GLUCERNA SHAKE)  237 mL Oral TID BM   ferrous sulfate  325 mg Oral Daily   gabapentin  200 mg Oral BID   insulin aspart  0-15 Units Subcutaneous TID AC & HS   insulin aspart  4 Units Subcutaneous TID WC   insulin glargine-yfgn  12 Units Subcutaneous QHS   levothyroxine  150 mcg Oral Daily   lisinopril  2.5 mg Oral Daily   metoprolol tartrate  25 mg  Oral BID   multivitamin with minerals  1 tablet Oral Daily   pantoprazole  80 mg Oral Daily   Ensure Max Protein  11 oz Oral Daily   pyridOXINE  100 mg Oral Daily   sodium chloride flush  3 mL Intravenous Q12H   sodium chloride flush  3 mL Intravenous Q12H   Continuous Infusions:  sodium chloride     sodium chloride 20 mL/hr at 10/11/20 1931   promethazine (PHENERGAN) injection (IM or IVPB) Stopped (10/12/20 0446)     Anti-infectives (From admission, onward)    Start     Dose/Rate Route Frequency Ordered Stop   10/12/20  0908  ceFAZolin 1 g / gentamicin 80 mg in NS 500 mL surgical irrigation  Status:  Discontinued          As needed 10/12/20 0908 10/12/20 1024   10/12/20 0845  gentamicin (GARAMYCIN) 80 mg in sodium chloride 0.9 % 500 mL irrigation  Status:  Discontinued        80 mg Irrigation On call 10/12/20 0756 10/12/20 1024   10/12/20 0845  ceFAZolin (ANCEF) IVPB 2g/100 mL premix  Status:  Discontinued        2 g 200 mL/hr over 30 Minutes Intravenous On call 10/12/20 0756 10/12/20 1024   10/12/20 0805  ceFAZolin (ANCEF) IVPB 1 g/50 mL premix  Status:  Discontinued        over 30 Minutes  Continuous PRN 10/12/20 0805 10/12/20 1024              Family Communication/Anticipated D/C date and plan/Code Status   DVT prophylaxis: SCD's Start: 10/11/20 1839 Place TED hose Start: 10/10/20 1904     Code Status: Full Code  Family Communication: None Disposition Plan:    Status is: Inpatient  Remains inpatient appropriate because:IV treatments appropriate due to intensity of illness or inability to take PO  Dispo: The patient is from: Home              Anticipated d/c is to: Home              Patient currently is not medically stable to d/c.   Difficult to place patient No           Subjective:   Interval events noted.  She feels better today.  No chest pain or shortness of breath.  Objective:    Vitals:   10/16/20 0500 10/16/20 0550 10/16/20 0556 10/16/20 0753  BP:    (!) 130/50  Pulse:    75  Resp:    17  Temp:    98.5 F (36.9 C)  TempSrc:    Oral  SpO2:  96% (!) 86% 98%  Weight: 67.8 kg     Height:       No data found.   Intake/Output Summary (Last 24 hours) at 10/16/2020 1020 Last data filed at 10/16/2020 1009 Gross per 24 hour  Intake 243 ml  Output 4250 ml  Net -4007 ml   Filed Weights   10/13/20 0500 10/14/20 0454 10/16/20 0500  Weight: 62.5 kg 67.7 kg 67.8 kg    Exam:  GEN: NAD SKIN: Warm and dry.  Dressing on pacemaker left upper chest is clean,  dry and intact. EYES: No pallor or icterus ENT: MMM CV: RRR PULM: Mild bibasilar rales.  No wheezing. ABD: soft, ND, NT, +BS CNS: AAO x 3, non focal EXT: No edema or tenderness      Data Reviewed:   I have personally  reviewed following labs and imaging studies:  Labs: Labs show the following:   Basic Metabolic Panel: Recent Labs  Lab 10/10/20 1624 10/11/20 0626 10/12/20 1954 10/13/20 0454 10/14/20 0443 10/15/20 0453 10/15/20 1106 10/16/20 0445  NA 135   < > 134* 137 137 140 137 136  K 4.5   < > 4.7 4.9 4.9 4.1 3.9 3.7  CL 100   < > 101 105 106 104 101 101  CO2 27   < > 25 26 24 25 25 29   GLUCOSE 360*   < > 227* 144* 205* 133* 235* 131*  BUN 37*   < > 37* 42* 44* 36* 35* 42*  CREATININE 1.12*   < > 1.50* 1.68* 1.40* 1.03* 0.92 1.14*  CALCIUM 8.5*   < > 7.6* 7.6* 7.6* 8.2* 8.5* 7.7*  MG 1.9  --  1.9 1.9  --   --   --  1.9   < > = values in this interval not displayed.   GFR Estimated Creatinine Clearance: 29.6 mL/min (A) (by C-G formula based on SCr of 1.14 mg/dL (H)). Liver Function Tests: Recent Labs  Lab 10/10/20 1624 10/12/20 1954 10/13/20 0454 10/15/20 1106  AST 21  --  20 84*  ALT 17  --  9 59*  ALKPHOS 65  --  43 74  BILITOT 0.5  --  0.7 1.1  PROT 6.9  --  5.3* 6.8  ALBUMIN 3.9 2.7* 3.2* 3.5   No results for input(s): LIPASE, AMYLASE in the last 168 hours. No results for input(s): AMMONIA in the last 168 hours. Coagulation profile No results for input(s): INR, PROTIME in the last 168 hours.  CBC: Recent Labs  Lab 10/10/20 1624 10/11/20 0626 10/12/20 1116 10/13/20 0454 10/14/20 0443 10/15/20 1106 10/16/20 0445  WBC 8.6   < > 11.8* 9.2 10.3 14.7* 9.7  NEUTROABS 4.9  --   --  5.4 7.2 10.7* 5.9  HGB 12.0   < > 12.9 9.3* 10.2* 11.6* 9.5*  HCT 36.4   < > 39.3 27.8* 30.8* 34.6* 28.3*  MCV 92.4   < > 91.0 92.4 93.1 91.3 91.0  PLT 184   < > 172 126* 140* 170 151   < > = values in this interval not displayed.   Cardiac Enzymes: No results  for input(s): CKTOTAL, CKMB, CKMBINDEX, TROPONINI in the last 168 hours. BNP (last 3 results) No results for input(s): PROBNP in the last 8760 hours. CBG: Recent Labs  Lab 10/14/20 2045 10/15/20 1139 10/15/20 1604 10/15/20 2102 10/16/20 0750  GLUCAP 185* 185* 204* 285* 146*   D-Dimer: No results for input(s): DDIMER in the last 72 hours.  Hgb A1c: No results for input(s): HGBA1C in the last 72 hours. Lipid Profile: No results for input(s): CHOL, HDL, LDLCALC, TRIG, CHOLHDL, LDLDIRECT in the last 72 hours. Thyroid function studies: Recent Labs    10/15/20 1339  TSH 2.010    Anemia work up: No results for input(s): VITAMINB12, FOLATE, FERRITIN, TIBC, IRON, RETICCTPCT in the last 72 hours.  Sepsis Labs: Recent Labs  Lab 10/10/20 1924 10/11/20 0626 10/13/20 0454 10/14/20 0443 10/15/20 1106 10/15/20 1339 10/16/20 0445  PROCALCITON <0.10  --   --   --  <0.10  --  0.10  WBC  --    < > 9.2 10.3 14.7*  --  9.7  LATICACIDVEN  --   --   --   --  1.7 1.9  --    < > = values  in this interval not displayed.    Microbiology Recent Results (from the past 240 hour(s))  Resp Panel by RT-PCR (Flu A&B, Covid) Nasopharyngeal Swab     Status: None   Collection Time: 10/10/20  4:24 PM   Specimen: Nasopharyngeal Swab; Nasopharyngeal(NP) swabs in vial transport medium  Result Value Ref Range Status   SARS Coronavirus 2 by RT PCR NEGATIVE NEGATIVE Final    Comment: (NOTE) SARS-CoV-2 target nucleic acids are NOT DETECTED.  The SARS-CoV-2 RNA is generally detectable in upper respiratory specimens during the acute phase of infection. The lowest concentration of SARS-CoV-2 viral copies this assay can detect is 138 copies/mL. A negative result does not preclude SARS-Cov-2 infection and should not be used as the sole basis for treatment or other patient management decisions. A negative result may occur with  improper specimen collection/handling, submission of specimen other than  nasopharyngeal swab, presence of viral mutation(s) within the areas targeted by this assay, and inadequate number of viral copies(<138 copies/mL). A negative result must be combined with clinical observations, patient history, and epidemiological information. The expected result is Negative.  Fact Sheet for Patients:  BloggerCourse.com  Fact Sheet for Healthcare Providers:  SeriousBroker.it  This test is no t yet approved or cleared by the [place] FDA and  has been authorized for detection and/or diagnosis of SARS-CoV-2 by FDA under an Emergency Use Authorization (EUA). This EUA will remain  in effect (meaning this test can be used) for the duration of the COVID-19 declaration under Section 564(b)(1) of the Act, 21 U.S.C.section 360bbb-3(b)(1), unless the authorization is terminated  or revoked sooner.       Influenza A by PCR NEGATIVE NEGATIVE Final   Influenza B by PCR NEGATIVE NEGATIVE Final    Comment: (NOTE) The Xpert Xpress SARS-CoV-2/FLU/RSV plus assay is intended as an aid in the diagnosis of influenza from Nasopharyngeal swab specimens and should not be used as a sole basis for treatment. Nasal washings and aspirates are unacceptable for Xpert Xpress SARS-CoV-2/FLU/RSV testing.  Fact Sheet for Patients: BloggerCourse.com  Fact Sheet for Healthcare Providers: SeriousBroker.it  This test is not yet approved or cleared by the [place] FDA and has been authorized for detection and/or diagnosis of SARS-CoV-2 by FDA under an Emergency Use Authorization (EUA). This EUA will remain in effect (meaning this test can be used) for the duration of the COVID-19 declaration under Section 564(b)(1) of the Act, 21 U.S.C. section 360bbb-3(b)(1), unless the authorization is terminated or revoked.  Performed at [facility], [address] [place], [place]  [address]   MRSA Next Gen by PCR, Nasal     Status: None   Collection Time: 10/11/20  6:10 PM   Specimen: Nasal Mucosa; Nasal Swab  Result Value Ref Range Status   MRSA by PCR Next Gen NOT DETECTED NOT DETECTED Final    Comment: (NOTE) The GeneXpert MRSA Assay (FDA approved for NASAL specimens only), is one component of a comprehensive MRSA colonization surveillance program. It is not intended to diagnose MRSA infection nor to guide or monitor treatment for MRSA infections. Test performance is not FDA approved in patients less than 29 years old. Performed at [facility], [address] [place], [place] [address]     Procedures and diagnostic studies:  DG Chest [facility] 1 View  Result Date: 10/15/2020 CLINICAL DATA:  Post rapid response. EXAM: PORTABLE CHEST 1 VIEW COMPARISON:  Chest radiograph October 14, 2020. FINDINGS: Multi lead pacer apparatus overlies the left hemithorax.  Monitoring leads overlie the patient. Stable cardiac and mediastinal contours. Interval increase in diffuse bilateral airspace opacities. Elevation right hemidiaphragm. Small layering right pleural effusion. No definite pneumothorax. Thoracic spine degenerative changes. IMPRESSION: Worsening bilateral airspace opacities which may represent pulmonary edema versus infection. Probable layering right pleural effusion. Electronically Signed   By: Annia Belt M.D.   On: 10/15/2020 11:32   DG Chest Port 1 View  Result Date: 10/14/2020 CLINICAL DATA:  Cough.  Syncope. EXAM: PORTABLE CHEST 1 VIEW COMPARISON:  10/13/2020 FINDINGS: A dual lead pacemaker remains in place. The cardiac silhouette remains borderline enlarged. There is unchanged elevation of the right hemidiaphragm. The interstitial markings remain mildly increased diffusely, and there are unchanged right greater than left basilar airspace opacities. No large pleural effusion or pneumothorax is identified. IMPRESSION: Unchanged right greater than left lung  opacities which may reflect a combination of atelectasis and mild edema versus infection. Electronically Signed   By: Sebastian Ache M.D.   On: 10/14/2020 15:34               LOS: 4 days   Cain Fitzhenry  [facility]   Pager on [email or web address]. If 7PM-7AM, please contact night-coverage at [email or web address]     10/16/2020, 10:20 AM

## 2020-10-17 LAB — BASIC METABOLIC PANEL
Anion gap: 7 (ref 5–15)
BUN: 46 mg/dL — ABNORMAL HIGH (ref 8–23)
CO2: 29 mmol/L (ref 22–32)
Calcium: 7.7 mg/dL — ABNORMAL LOW (ref 8.9–10.3)
Chloride: 100 mmol/L (ref 98–111)
Creatinine, Ser: 1.24 mg/dL — ABNORMAL HIGH (ref 0.44–1.00)
GFR, Estimated: 41 mL/min — ABNORMAL LOW (ref >60)
Glucose, Bld: 206 mg/dL — ABNORMAL HIGH (ref 70–99)
Potassium: 4.2 mmol/L (ref 3.5–5.1)
Sodium: 136 mmol/L (ref 135–145)

## 2020-10-17 LAB — MAGNESIUM: Magnesium: 1.9 mg/dL (ref 1.7–2.4)

## 2020-10-17 LAB — GLUCOSE, CAPILLARY
Glucose-Capillary: 255 mg/dL — ABNORMAL HIGH (ref 70–99)
Glucose-Capillary: 272 mg/dL — ABNORMAL HIGH (ref 70–99)

## 2020-10-17 LAB — PROCALCITONIN: Procalcitonin: <0.1 ng/mL

## 2020-10-17 MED ORDER — METOPROLOL TARTRATE 25 MG PO TABS: 25.0000 mg | ORAL_TABLET | Freq: Two times a day (BID) | ORAL | 0 refills | Status: DC

## 2020-10-17 MED ORDER — GABAPENTIN 100 MG PO CAPS: 200.0000 mg | ORAL_CAPSULE | Freq: Three times a day (TID) | ORAL | 3 refills | Status: DC

## 2020-10-17 MED ORDER — FUROSEMIDE 20 MG PO TABS: 10.0000 mg | ORAL_TABLET | Freq: Every day | ORAL | 0 refills | Status: DC

## 2020-10-17 NOTE — Progress Notes (Signed)
SATURATION QUALIFICATIONS: (This note is used to comply with regulatory documentation for home oxygen)  Patient Saturations on Room Air at Rest = 95%  Patient Saturations on Room Air while Ambulating = 86%  Patient Saturations on 1 Liters of oxygen while Ambulating = 92%  Please briefly explain why patient needs home oxygen: Pt was able to ambulate 50 ft with 1L [place] O2 without any SOB, orthopnea, or without increased work of breathing.

## 2020-10-17 NOTE — Progress Notes (Signed)
Pt being discharged at this time, discharge instructions reviewed with POA Emma, states understanding, pt with no complaints, o2 in place

## 2020-10-17 NOTE — Telephone Encounter (Signed)
Patient's daughter calling back in. Patient was discharged today. Spoke with PCP and was told to add Patient to Tuesday 10/18/20 schedule at 12. Done and Patient's daughter verbalized understanding.

## 2020-10-17 NOTE — Care Management Important Message (Signed)
Important Message  Patient Details  Name: Amy Calhoun MRN: [id number] Date of Birth: Jun 27, 1928   Medicare Important Message Given:  Yes     [name], Stephan Minister 10/17/2020, 2:11 PM

## 2020-10-17 NOTE — TOC Progression Note (Signed)
Transition of Care [facility]) - Progression Note    Patient Details  Name: Nolah Krenzer MRN: [id number] Date of Birth: 29-Jun-1928  Transition [facility]) CM/SW Contact  Allayne Butcher, RN Phone Number: 10/17/2020, 1:56 PM  Clinical Narrative:    Oxygen has been delivered to the room.  Blaine Hamper with [employer] reviewed oxygen instructions with patient's daughter via phone.  Daughter will be coming to pick up patient soon.      Barriers to Discharge: Barriers Resolved  Expected Discharge Plan and Services           Expected Discharge Date: 10/17/20               DME Arranged: Oxygen DME Agency: [facility] Date DME Agency Contacted: 10/17/20 Time DME Agency Contacted: 1130 Representative spoke with at DME Agency: Bjorn Loser HH Arranged: RN, PT [facility] Agency: [facility] Date [facility] Agency Contacted: 10/17/20 Time HH Agency Contacted: 1322 Representative spoke with at [facility] Agency: Cala Bradford   Social Determinants of Health (SDOH) Interventions    Readmission Risk Interventions No flowsheet data found.

## 2020-10-17 NOTE — Progress Notes (Signed)
Inpatient Diabetes Program Recommendations  AACE/ADA: New Consensus Statement on Inpatient Glycemic Control (2015)  Target Ranges:  Prepandial:   less than 140 mg/dL      Peak postprandial:   less than 180 mg/dL (1-2 hours)      Critically ill patients:  140 - 180 mg/dL   Lab Results  Component Value Date   GLUCAP 255 (H) 10/17/2020   HGBA1C 9.4 (A) 09/21/2020    Review of Glycemic Control Results for Amy Calhoun, Amy Calhoun (MRN [id number]) as of 10/17/2020 09:19  Ref. Range 10/16/2020 07:50 10/16/2020 11:18 10/16/2020 16:40 10/16/2020 21:20 10/17/2020 08:12  Glucose-Capillary Latest Ref Range: 70 - 99 mg/dL 749 (H) 449 (H) 675 (H) 256 (H) 255 (H)   Diabetes history: DM2 Outpatient Diabetes medications: Lantus 8 units QHS, Humalog 8 units TID with meals plus additional units for correction Current orders for Inpatient glycemic control: Semglee 12 units, Novolog 4 units tid meal coverage, Novolog correction 0-15 units qid  Inpatient Diabetes Program Recommendations:   Consider: -Increase Novolog meal coverage to 5 units tid if eats 50% Secure chat sent to Dr. Myriam Calhoun.  Thank you, Amy Calhoun. Amy Caraveo, RN, MSN, CDE  Diabetes Coordinator Inpatient Glycemic Control Team Team Pager [phone] (8am-5pm) 10/17/2020 9:22 AM

## 2020-10-17 NOTE — TOC Transition Note (Signed)
Transition of Care [facility]) - CM/SW Discharge Note   Patient Details  Name: Amy Calhoun MRN: [id number] Date of Birth: 1928/06/07  Transition of Care [facility]) CM/SW Contact:  Allayne Butcher, RN Phone Number: 10/17/2020, 1:22 PM   Clinical Narrative:    Patient medically cleared for discharge home with home health services today.  Patient also need home oxygen.  Oxygen has been ordered from [employer] and will be delivered to the patient's room before discharge.  Patient's daughter will be taking her home today.  RNCM reached out to [facility] but no one has returned call so far.  Home Health RN and PT arranged with [facility]     Final next level of care: Home w Home Health Services Barriers to Discharge: Barriers Resolved   Patient Goals and CMS Choice Patient states their goals for this hospitalization and ongoing recovery are:: Patient agrees with home health services at discharge CMS Medicare.gov Compare Post Acute Care list provided to:: Patient    Discharge Placement                       Discharge Plan and Services                DME Arranged: Oxygen DME Agency: [facility] Date DME Agency Contacted: 10/17/20 Time DME Agency Contacted: 1130 Representative spoke with at DME Agency: Bjorn Loser HH Arranged: RN, PT [facility] Agency: [facility] Date [facility] Agency Contacted: 10/17/20 Time HH Agency Contacted: 1322 Representative spoke with at [facility] Agency: Cala Bradford  Social Determinants of Health (SDOH) Interventions     Readmission Risk Interventions No flowsheet data found.

## 2020-10-17 NOTE — Discharge Summary (Signed)
Physician Discharge Summary  Amy Calhoun PJK:[id number] DOB: December 05, 1928 DOA: 10/10/2020  PCP: Allegra Grana, FNP  Admit date: 10/10/2020 Discharge date: 10/17/2020  Discharge disposition: Home with home health therapy   Recommendations for Outpatient Follow-Up:   Follow-up with PCP in 1 week. Follow-up with cardiologist as scheduled.   Discharge Diagnosis:   Principal Problem:   Syncope Active Problems:   Esophageal dysmotility   Hyperlipidemia   Hypothyroidism   HTN (hypertension), malignant   CKD (chronic kidney disease) stage 3, GFR 30-59 ml/min (HCC)   AKI (acute kidney injury) (HCC)   Gastroesophageal reflux disease without esophagitis   Fatigue   Depression   Weakness of both lower extremities   Type 2 diabetes mellitus with hyperglycemia, with long-term current use of insulin (HCC)   B12 deficiency   Abnormal gait   Symptomatic bradycardia   Complete heart block (HCC)   Malnutrition of moderate degree   Acute on chronic diastolic CHF (congestive heart failure) (HCC)    Discharge Condition: Stable.  Diet recommendation:  Diet Order             Diet - low sodium heart healthy           Diet Carb Modified           DIET DYS 3 Room service appropriate? Yes; Fluid consistency: Thin  Diet effective now                     Code Status: Full Code     Hospital Course:   Amy Calhoun is a 87 y.o. female with medical history significant for insulin-dependent diabetes mellitus, hypertension, hyperlipidemia, hypothyroidism, GERD, who presented to the hospital because of syncope.   She was found to have intermittent high-grade AV block with up to 6-second pause on telemetry.  She underwent left heart cath with placement of transvenous pacemaker on 10/11/2020.  This was followed by placement of a permanent pacemaker on 10/12/2020.   She developed hypotension and acute kidney injury.  She was exposed to IV contrast from CTA of the chest  and left heart catheterization.  Lisinopril was held and she was treated with IV fluids.   She developed acute on chronic diastolic CHF requiring treatment with IV Lasix.  Her oxygen saturation was 86% on room air with ambulation and it improved to 92% on 1 L per minute oxygen via nasal cannula.  She will be discharged on 1 L/min oxygen via nasal cannula for chronic hypoxemic respiratory failure.  Her condition has improved and she is deemed stable for discharge to home.  Discharge plan was discussed with the patient, her daughter [name]) and her granddaughter at the bedside.        Medical Consultants:   Cardiologist   Discharge Exam:    Vitals:   10/16/20 2004 10/16/20 2144 10/17/20 0040 10/17/20 0748  BP: (!) 140/52 (!) 151/60 (!) 134/55 (!) 159/77  Pulse: 74 75 65 78  Resp: 16  17 19   Temp: 98.8 F (37.1 C)  99.2 F (37.3 C) 98.7 F (37.1 C)  TempSrc:    Oral  SpO2: 91%  94% 96%  Weight:      Height:         GEN: NAD SKIN: Warm and dry EYES: No pallor or icterus ENT: MMM CV: RRR PULM: Mild bibasilar rales.  No wheezing ABD: soft, ND, NT, +BS CNS: AAO x 3, non focal EXT: No edema or tenderness  The results of significant diagnostics from this hospitalization (including imaging, microbiology, ancillary and laboratory) are listed below for reference.     Procedures and Diagnostic Studies:   DG Chest 2 View  Result Date: 10/12/2020 CLINICAL DATA:  Evaluate pacemaker EXAM: CHEST - 2 VIEW COMPARISON:  Chest x-ray dated October 11, 2020 FINDINGS: Interval placement of left chest wall dual lead pacemaker with leads overlying the expected area of the right atrium and right ventricle. Cardiac and mediastinal contours are unchanged and within normal limits. Decreased bilateral heterogeneous opacities. Lateral view is limited due to arms down position. No large pleural effusion or evidence of pneumothorax. IMPRESSION: Interval placement of left chest wall dual lead  pacemaker. No evidence of pleural effusion or pneumothorax. Decreased bilateral heterogeneous pulmonary opacities, likely due to improved pulmonary edema. Electronically Signed   By: Allegra Lai M.D.   On: 10/12/2020 11:31   CT Angio Chest Pulmonary Embolism (PE) W or WO Contrast  Result Date: 10/11/2020 CLINICAL DATA:  Presyncope, hyperlipidemia, hypertension, diabetes EXAM: CT ANGIOGRAPHY CHEST WITH CONTRAST TECHNIQUE: Multidetector CT imaging of the chest was performed using the standard protocol during bolus administration of intravenous contrast. Multiplanar CT image reconstructions and MIPs were obtained to evaluate the vascular anatomy. CONTRAST:  75mL OMNIPAQUE IOHEXOL 350 MG/ML SOLN COMPARISON:  10/10/2020 FINDINGS: Cardiovascular: This is a technically adequate evaluation of the pulmonary vasculature. No filling defects or pulmonary emboli. The heart is unremarkable without pericardial effusion. Mild calcification of the mitral annulus. No evidence of thoracic aortic aneurysm or dissection. There is extensive atherosclerosis of the thoracic aorta and coronary vasculature. Mediastinum/Nodes: No enlarged mediastinal, hilar, or axillary lymph nodes. Thyroid gland, trachea, and esophagus demonstrate no significant findings. Lungs/Pleura: No acute airspace disease, effusion, or pneumothorax. Central airways are patent. Upper Abdomen: No acute abnormality. Musculoskeletal: No acute or destructive bony lesions. Reconstructed images demonstrate no additional findings. Review of the MIP images confirms the above findings. IMPRESSION: 1. No evidence of pulmonary embolus. 2. No acute intrathoracic process. 3. Aortic Atherosclerosis (ICD10-I70.0). Coronary artery atherosclerosis. Electronically Signed   By: Sharlet Salina M.D.   On: 10/11/2020 15:43   MR BRAIN WO CONTRAST  Result Date: 10/11/2020 CLINICAL DATA:  Transient ischemic attack EXAM: MRI HEAD WITHOUT CONTRAST TECHNIQUE: Multiplanar, multiecho  pulse sequences of the brain and surrounding structures were obtained without intravenous contrast. COMPARISON:  None. FINDINGS: Brain: No acute infarct, mass effect or extra-axial collection. No acute or chronic hemorrhage. Hyperintense T2-weighted signal is moderately widespread throughout the white matter. Generalized volume loss without a clear lobar predilection. 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 The midline structures are normal. Vascular: Major flow voids are preserved. Skull and upper cervical spine: Normal calvarium and skull base. Visualized upper cervical spine and soft tissues are normal. Sinuses/Orbits:No paranasal sinus fluid levels or advanced mucosal thickening. No mastoid or middle ear effusion. Normal orbits. IMPRESSION: 1. No acute intracranial abnormality. 2. Findings of chronic ischemic microangiopathy and generalized volume loss. Electronically Signed   By: Deatra Robinson M.D.   On: 10/11/2020 01:26   CARDIAC CATHETERIZATION  Result Date: 10/11/2020 Conclusions: Severe single-vessel coronary artery disease with diffuse disease involving relatively small RCA extending from the ostium through the mid/distal vessel of up to 90%.  Mild-moderate disease is also noted in the left coronary artery. Mildly elevated left ventricular filling pressure (LVEDP 15-20 mmHg). Successful placement of 57F temporary transvenous pacing wire via the right internal jugular vein. Recommendations: Recommend medical therapy of relatively small and diffusely  diseased RCA, which already fills via faint left-right collaterals to the PDA. Secondary prevention of coronary artery disease. STAT portable chest radiograph when patient arrives in ICU for evaluation of temporary transvenous pacemaker placement. EP consultation with plans for permanent pacemaker tomorrow morning with Dr. Lalla Brothers. Yvonne Kendall, MD [facility]  DG Chest Port 1 View  Result Date: 10/11/2020 CLINICAL DATA:   Complete heart block, right IJ line EXAM: PORTABLE CHEST 1 VIEW COMPARISON:  10/10/2020 FINDINGS: Right IJ temporary pacemaker lead overlies the right ventricle. Elevation of the right hemidiaphragm. Increased interstitial changes. No significant pleural effusion. No pneumothorax. Normal heart size. IMPRESSION: Right IJ temporary pacemaker lead overlies right ventricle. No pneumothorax. Persistent elevation of right hemidiaphragm. Increased interstitial changes probably reflect edema. Electronically Signed   By: Guadlupe Spanish M.D.   On: 10/11/2020 19:32   EEG adult  Result Date: 10/11/2020 Charlsie Quest, MD     10/11/2020  1:39 PM Patient Name: Tyla Burgner MRN: [id number] Epilepsy Attending: Charlsie Quest Referring Physician/Provider: Dr Londell Moh Date: 10/11/2020 Duration: 20.38 mins Patient history: 87 year old female with syncope.  EEG to evaluate for seizures. Level of alertness: Awake AEDs during EEG study: None Technical aspects: This EEG study was done with scalp electrodes positioned according to the 10-20 International system of electrode placement. Electrical activity was acquired at a sampling rate of 500Hz  and reviewed with a high frequency filter of 70Hz  and a low frequency filter of 1Hz . EEG data were recorded continuously and digitally stored. Description: The posterior dominant rhythm consists of 8-9 Hz activity of moderate voltage (25-35 uV) seen predominantly in posterior head regions, symmetric and reactive to eye opening and eye closing.  Intermittent left temporal 2-3Hz  delta slowing was also noted.  Physiologic photic driving was not seen during photic stimulation.  Hyperventilation was not performed.   ABNORMALITY -Intermittent slow, left temporal region IMPRESSION: This study is suggestive of nonspecific cortical dysfunction in left temporal region.  No seizures or epileptiform discharges were seen throughout the recording.   ECHOCARDIOGRAM COMPLETE  Result  Date: 10/11/2020    ECHOCARDIOGRAM REPORT   Patient Name:   Jaylan MAE Nigh Date of Exam: 10/11/2020 Medical Rec #:  Charlsie Quest         Height:       67.0 in Accession #:    10/13/2020        Weight:       121.3 lb Date of Birth:  10-07-1928          BSA:          1.635 m Patient Age:    87 years          BP:           141/65 mmHg Patient Gender: F                 HR:           68 bpm. Exam Location:  [facility] Procedure: 2D Echo, Cardiac Doppler and Color Doppler Indications:     Syncope R55  History:         Patient has no prior history of Echocardiogram examinations.                  Risk Factors:Hypertension and Dyslipidemia.  Sonographer:     [id number] Referring Phys:  [id number] AMY N COX Diagnosing Phys: 11/16/1928 End MD  Sonographer Comments: Suboptimal apical window and no subcostal window. IMPRESSIONS  1. Left ventricular  ejection fraction, by estimation, is 60 to 65%. The left ventricle has normal function. Left ventricular endocardial border not optimally defined to evaluate regional wall motion. There is mild left ventricular hypertrophy. Left ventricular diastolic parameters are consistent with Grade I diastolic dysfunction (impaired relaxation). Elevated left atrial pressure.  2. Right ventricular systolic function is normal. The right ventricular size is normal. Tricuspid regurgitation signal is inadequate for assessing PA pressure.  3. Left atrial size was mildly dilated.  4. The mitral valve is degenerative. Trivial mitral valve regurgitation. No evidence of mitral stenosis. Moderate to severe mitral annular calcification.  5. The aortic valve is tricuspid. There is mild calcification of the aortic valve. There is mild thickening of the aortic valve. Aortic valve regurgitation is mild. Mild to moderate aortic valve sclerosis/calcification is present, without any evidence of aortic stenosis. FINDINGS  Left Ventricle: Left ventricular ejection fraction, by estimation, is 60 to 65%. The left ventricle has  normal function. Left ventricular endocardial border not optimally defined to evaluate regional wall motion. The left ventricular internal cavity size was normal in size. There is mild left ventricular hypertrophy. Left ventricular diastolic parameters are consistent with Grade I diastolic dysfunction (impaired relaxation). Elevated left atrial pressure. Right Ventricle: The right ventricular size is normal. No increase in right ventricular wall thickness. Right ventricular systolic function is normal. Tricuspid regurgitation signal is inadequate for assessing PA pressure. Left Atrium: Left atrial size was mildly dilated. Right Atrium: Right atrial size was normal in size. Pericardium: The pericardium was not well visualized. Mitral Valve: The mitral valve is degenerative in appearance. There is mild thickening of the mitral valve leaflet(s). There is mild calcification of the mitral valve leaflet(s). Moderate to severe mitral annular calcification. Trivial mitral valve regurgitation. No evidence of mitral valve stenosis. MV peak gradient, 11.3 mmHg. The mean mitral valve gradient is 4.0 mmHg. Tricuspid Valve: The tricuspid valve is normal in structure. Tricuspid valve regurgitation is trivial. Aortic Valve: The aortic valve is tricuspid. There is mild calcification of the aortic valve. There is mild thickening of the aortic valve. Aortic valve regurgitation is mild. Mild to moderate aortic valve sclerosis/calcification is present, without any evidence of aortic stenosis. Aortic valve mean gradient measures 4.5 mmHg. Aortic valve peak gradient measures 7.8 mmHg. Aortic valve area, by VTI measures 2.02 cm. Pulmonic Valve: The pulmonic valve was grossly normal. Pulmonic valve regurgitation is not visualized. No evidence of pulmonic stenosis. Aorta: The aortic root is normal in size and structure. Venous: The inferior vena cava was not well visualized. IAS/Shunts: The interatrial septum was not well visualized.  LEFT  VENTRICLE PLAX 2D LVIDd:         3.70 cm  Diastology LVIDs:         2.10 cm  LV e' medial:    3.70 cm/s LV PW:         1.00 cm  LV E/e' medial:  28.6 LV IVS:        1.09 cm  LV e' lateral:   4.90 cm/s LVOT diam:     1.95 cm  LV E/e' lateral: 21.6 LV SV:         60 LV SV Index:   37 LVOT Area:     2.99 cm  RIGHT VENTRICLE RV Basal diam:  2.20 cm RV S prime:     8.38 cm/s TAPSE (M-mode): 1.7 cm LEFT ATRIUM             Index  RIGHT ATRIUM          Index LA diam:        3.10 cm 1.90 cm/m  RA Area:     6.90 cm LA Vol (A2C):   68.7 ml 42.02 ml/m RA Volume:   10.00 ml 6.12 ml/m LA Vol (A4C):   36.1 ml 22.08 ml/m LA Biplane Vol: 54.9 ml 33.58 ml/m  AORTIC VALVE                    PULMONIC VALVE AV Area (Vmax):    1.68 cm     PV Vmax:        0.98 m/s AV Area (Vmean):   1.58 cm     PV Peak grad:   3.9 mmHg AV Area (VTI):     2.02 cm     RVOT Peak grad: 4 mmHg AV Vmax:           139.50 cm/s AV Vmean:          100.450 cm/s AV VTI:            0.298 m AV Peak Grad:      7.8 mmHg AV Mean Grad:      4.5 mmHg LVOT Vmax:         78.30 cm/s LVOT Vmean:        53.300 cm/s LVOT VTI:          0.202 m LVOT/AV VTI ratio: 0.68  AORTA Ao Root diam: 3.40 cm MITRAL VALVE MV Area (PHT): 2.26 cm     SHUNTS MV Area VTI:   1.53 cm     Systemic VTI:  0.20 m MV Peak grad:  11.3 mmHg    Systemic Diam: 1.95 cm MV Mean grad:  4.0 mmHg MV Vmax:       1.68 m/s MV Vmean:      97.1 cm/s MV Decel Time: 336 msec MV E velocity: 106.00 cm/s MV A velocity: 141.00 cm/s MV E/A ratio:  0.75 Christopher End MD Electronically signed by Yvonne Kendall MD Signature Date/Time: 10/11/2020/12:37:28 PM    Final      Labs:   Basic Metabolic Panel: Recent Labs  Lab 10/10/20 1624 10/11/20 4010 10/12/20 1954 10/13/20 0454 10/14/20 0443 10/15/20 0453 10/15/20 1106 10/16/20 0445 10/17/20 0426  NA 135   < > 134* 137 137 140 137 136 136  K 4.5   < > 4.7 4.9 4.9 4.1 3.9 3.7 4.2  CL 100   < > 101 105 106 104 101 101 100  CO2 27   < > 25 26 24  25 25 29 29   GLUCOSE 360*   < > 227* 144* 205* 133* 235* 131* 206*  BUN 37*   < > 37* 42* 44* 36* 35* 42* 46*  CREATININE 1.12*   < > 1.50* 1.68* 1.40* 1.03* 0.92 1.14* 1.24*  CALCIUM 8.5*   < > 7.6* 7.6* 7.6* 8.2* 8.5* 7.7* 7.7*  MG 1.9  --  1.9 1.9  --   --   --  1.9 1.9   < > = values in this interval not displayed.   GFR Estimated Creatinine Clearance: 27.2 mL/min (A) (by C-G formula based on SCr of 1.24 mg/dL (H)). Liver Function Tests: Recent Labs  Lab 10/10/20 1624 10/12/20 1954 10/13/20 0454 10/15/20 1106  AST 21  --  20 84*  ALT 17  --  9 59*  ALKPHOS 65  --  43 74  BILITOT 0.5  --  0.7 1.1  PROT 6.9  --  5.3* 6.8  ALBUMIN 3.9 2.7* 3.2* 3.5   No results for input(s): LIPASE, AMYLASE in the last 168 hours. No results for input(s): AMMONIA in the last 168 hours. Coagulation profile No results for input(s): INR, PROTIME in the last 168 hours.  CBC: Recent Labs  Lab 10/10/20 1624 10/11/20 0626 10/12/20 1116 10/13/20 0454 10/14/20 0443 10/15/20 1106 10/16/20 0445  WBC 8.6   < > 11.8* 9.2 10.3 14.7* 9.7  NEUTROABS 4.9  --   --  5.4 7.2 10.7* 5.9  HGB 12.0   < > 12.9 9.3* 10.2* 11.6* 9.5*  HCT 36.4   < > 39.3 27.8* 30.8* 34.6* 28.3*  MCV 92.4   < > 91.0 92.4 93.1 91.3 91.0  PLT 184   < > 172 126* 140* 170 151   < > = values in this interval not displayed.   Cardiac Enzymes: No results for input(s): CKTOTAL, CKMB, CKMBINDEX, TROPONINI in the last 168 hours. BNP: Invalid input(s): POCBNP CBG: Recent Labs  Lab 10/16/20 0750 10/16/20 1118 10/16/20 1640 10/16/20 2120 10/17/20 0812  GLUCAP 146* 201* 257* 256* 255*   D-Dimer No results for input(s): DDIMER in the last 72 hours. Hgb A1c No results for input(s): HGBA1C in the last 72 hours. Lipid Profile No results for input(s): CHOL, HDL, LDLCALC, TRIG, CHOLHDL, LDLDIRECT in the last 72 hours. Thyroid function studies Recent Labs    10/15/20 1339  TSH 2.010   Anemia work up No results for input(s):  VITAMINB12, FOLATE, FERRITIN, TIBC, IRON, RETICCTPCT in the last 72 hours. Microbiology Recent Results (from the past 240 hour(s))  Resp Panel by RT-PCR (Flu A&B, Covid) Nasopharyngeal Swab     Status: None   Collection Time: 10/10/20  4:24 PM   Specimen: Nasopharyngeal Swab; Nasopharyngeal(NP) swabs in vial transport medium  Result Value Ref Range Status   SARS Coronavirus 2 by RT PCR NEGATIVE NEGATIVE Final    Comment: (NOTE) SARS-CoV-2 target nucleic acids are NOT DETECTED.  The SARS-CoV-2 RNA is generally detectable in upper respiratory specimens during the acute phase of infection. The lowest concentration of SARS-CoV-2 viral copies this assay can detect is 138 copies/mL. A negative result does not preclude SARS-Cov-2 infection and should not be used as the sole basis for treatment or other patient management decisions. A negative result may occur with  improper specimen collection/handling, submission of specimen other than nasopharyngeal swab, presence of viral mutation(s) within the areas targeted by this assay, and inadequate number of viral copies(<138 copies/mL). A negative result must be combined with clinical observations, patient history, and epidemiological information. The expected result is Negative.  Fact Sheet for Patients:  BloggerCourse.com  Fact Sheet for Healthcare Providers:  SeriousBroker.it  This test is no t yet approved or cleared by the [place] FDA and  has been authorized for detection and/or diagnosis of SARS-CoV-2 by FDA under an Emergency Use Authorization (EUA). This EUA will remain  in effect (meaning this test can be used) for the duration of the COVID-19 declaration under Section 564(b)(1) of the Act, 21 U.S.C.section 360bbb-3(b)(1), unless the authorization is terminated  or revoked sooner.       Influenza A by PCR NEGATIVE NEGATIVE Final   Influenza B by PCR NEGATIVE NEGATIVE Final     Comment: (NOTE) The Xpert Xpress SARS-CoV-2/FLU/RSV plus assay is intended as an aid in the diagnosis of influenza from Nasopharyngeal swab specimens and should not be used as a sole basis  for treatment. Nasal washings and aspirates are unacceptable for Xpert Xpress SARS-CoV-2/FLU/RSV testing.  Fact Sheet for Patients: BloggerCourse.com  Fact Sheet for Healthcare Providers: SeriousBroker.it  This test is not yet approved or cleared by the [place] FDA and has been authorized for detection and/or diagnosis of SARS-CoV-2 by FDA under an Emergency Use Authorization (EUA). This EUA will remain in effect (meaning this test can be used) for the duration of the COVID-19 declaration under Section 564(b)(1) of the Act, 21 U.S.C. section 360bbb-3(b)(1), unless the authorization is terminated or revoked.  Performed at [facility], [address] [place], [place] [address]   MRSA Next Gen by PCR, Nasal     Status: None   Collection Time: 10/11/20  6:10 PM   Specimen: Nasal Mucosa; Nasal Swab  Result Value Ref Range Status   MRSA by PCR Next Gen NOT DETECTED NOT DETECTED Final    Comment: (NOTE) The GeneXpert MRSA Assay (FDA approved for NASAL specimens only), is one component of a comprehensive MRSA colonization surveillance program. It is not intended to diagnose MRSA infection nor to guide or monitor treatment for MRSA infections. Test performance is not FDA approved in patients less than 48 years old. Performed at [facility], [address] [place], [place] [address]      Discharge Instructions:   Discharge Instructions     Diet - low sodium heart healthy   Complete by: As directed    Diet Carb Modified   Complete by: As directed    Discharge wound care:   Complete by: As directed    Follow-up with cardiologist as scheduled.   Increase activity slowly   Complete by: As directed        Allergies as of 10/17/2020       Reactions   Pravachol [pravastatin] Other (See Comments)   States she refused due to side effects        Medication List     STOP taking these medications    CRANBERRY PO   riboflavin 100 MG Tabs tablet Commonly known as: VITAMIN B-2   Turmeric 500 MG Tabs       TAKE these medications    Acetaminophen-Codeine 300-30 MG tablet TAKE 1 TABLET BY MOUTH EVERY 12 (TWELVE) HOURS AS NEEDED FOR PAIN (MODERATE TO SEVERE PAIN).   albuterol 108 (90 Base) MCG/ACT inhaler Commonly known as: VENTOLIN HFA Inhale 2 puffs into the lungs every 6 (six) hours as needed for wheezing or shortness of breath.   amitriptyline 75 MG tablet Commonly known as: ELAVIL TAKE 2 TABLETS AT BEDTIME   ascorbic acid 500 MG tablet Commonly known as: VITAMIN C Take 500 mg by mouth daily.   Bariatric Rollator Misc Use as needed   cyanocobalamin 1000 MCG/ML injection Commonly known as: (VITAMIN B-12) INJECT WEEKLY FOR 4 WEEKS THEN INJECT MONTHLY   docusate sodium 100 MG capsule Commonly known as: COLACE Take 1 capsule (100 mg total) by mouth 2 (two) times daily.   ezetimibe 10 MG tablet Commonly known as: ZETIA Take 1 tablet (10 mg total) by mouth daily.   FreeStyle Libre 14 Day Sensor Misc 1 Package by Does not apply route every 14 (fourteen) days.   furosemide 20 MG tablet Commonly known as: Lasix Take 0.5 tablets (10 mg total) by mouth daily.   gabapentin 100 MG capsule Commonly known as: NEURONTIN Take 2 capsules (200 mg total) by mouth 3 (three) times daily. What changed: when to take this  glucagon 1 MG injection Inject 1 mg into the vein once as needed. For hypoglycemic episodes. E11.42   insulin lispro 100 UNIT/ML KwikPen Commonly known as: HumaLOG KwikPen Inject 8 Units into the skin 3 (three) times daily. Max Daily 30 units   Insulin Pen Needle 32G X 4 MM Misc 1 Device by Does not apply route in the morning, at noon, in the  evening, and at bedtime.   Iron (Ferrous Sulfate) 325 (65 Fe) MG Tabs Take 325 mg by mouth every other day. What changed: additional instructions   Lantus SoloStar 100 UNIT/ML Solostar Pen Generic drug: insulin glargine Inject 8 Units into the skin at bedtime.   levothyroxine 150 MCG tablet Commonly known as: SYNTHROID TAKE 1 TABLET DAILY   lisinopril 2.5 MG tablet Commonly known as: ZESTRIL Take 1 tablet (2.5 mg total) by mouth daily. Take for blood pressure greater than 140/90   metoprolol tartrate 25 MG tablet Commonly known as: LOPRESSOR Take 1 tablet (25 mg total) by mouth 2 (two) times daily.   omeprazole 40 MG capsule Commonly known as: PRILOSEC TAKE 1 CAPSULE TWICE A DAY   ondansetron 4 MG disintegrating tablet Commonly known as: Zofran ODT Take 1 tablet (4 mg total) by mouth every 8 (eight) hours as needed for nausea or vomiting.   pyridOXINE 100 MG tablet Commonly known as: VITAMIN B-6 Take 100 mg by mouth daily.   Vitamin D 50 MCG (2000 UT) tablet Take 2,000 Units by mouth daily.               Durable Medical Equipment  (From admission, onward)           Start     Ordered   10/17/20 1053  DME Oxygen  Once       Question Answer Comment  Length of Need Lifetime   Mode or (Route) Nasal cannula   Liters per Minute 1   Frequency Continuous (stationary and portable oxygen unit needed)   Oxygen delivery system Gas      10/17/20 1054              Discharge Care Instructions  (From admission, onward)           Start     Ordered   10/17/20 0000  Discharge wound care:       Comments: Follow-up with cardiologist as scheduled.   10/17/20 1054               If you experience worsening of your admission symptoms, develop shortness of breath, life threatening emergency, suicidal or homicidal thoughts you must seek medical attention immediately by calling 911 or calling your MD immediately  if symptoms less severe.   You must read  complete instructions/literature along with all the possible adverse reactions/side effects for all the medicines you take and that have been prescribed to you. Take any new medicines after you have completely understood and accept all the possible adverse reactions/side effects.    Please note   You were cared for by a hospitalist during your hospital stay. If you have any questions about your discharge medications or the care you received while you were in the hospital after you are discharged, you can call the unit and asked to speak with the hospitalist on call if the hospitalist that took care of you is not available. Once you are discharged, your primary care physician will handle any further medical issues. Please note that NO REFILLS for any discharge medications will  be authorized once you are discharged, as it is imperative that you return to your primary care physician (or establish a relationship with a primary care physician if you do not have one) for your aftercare needs so that they can reassess your need for medications and monitor your lab values.       Time coordinating discharge: 38 minutes  Signed:  Jakaila Norment  [facility] 10/17/2020, 10:54 AM   Pager on [email or web address]. If 7PM-7AM, please contact night-coverage at [email or web address]

## 2020-10-18 ENCOUNTER — Telehealth: Payer: Self-pay | Admitting: Family

## 2020-10-18 ENCOUNTER — Other Ambulatory Visit: Payer: Self-pay

## 2020-10-18 ENCOUNTER — Ambulatory Visit (INDEPENDENT_AMBULATORY_CARE_PROVIDER_SITE_OTHER): Payer: Medicare HMO | Admitting: Family

## 2020-10-18 ENCOUNTER — Encounter: Payer: Self-pay | Admitting: Family

## 2020-10-18 ENCOUNTER — Telehealth: Payer: Self-pay | Admitting: Cardiology

## 2020-10-18 VITALS — BP 118/76 | HR 69 | Ht 62.01 in

## 2020-10-18 DIAGNOSIS — I442 Atrioventricular block, complete: Secondary | ICD-10-CM

## 2020-10-18 DIAGNOSIS — I10 Essential (primary) hypertension: Secondary | ICD-10-CM

## 2020-10-18 DIAGNOSIS — N179 Acute kidney failure, unspecified: Secondary | ICD-10-CM

## 2020-10-18 DIAGNOSIS — E1129 Type 2 diabetes mellitus with other diabetic kidney complication: Secondary | ICD-10-CM

## 2020-10-18 DIAGNOSIS — Z794 Long term (current) use of insulin: Secondary | ICD-10-CM

## 2020-10-18 MED ORDER — LISINOPRIL 2.5 MG PO TABS: 2.5000 mg | ORAL_TABLET | Freq: Every day | ORAL | 1 refills | Status: DC

## 2020-10-18 MED ORDER — FUROSEMIDE 20 MG PO TABS: 10.0000 mg | ORAL_TABLET | Freq: Every day | ORAL | 1 refills | Status: DC

## 2020-10-18 MED ORDER — METOPROLOL TARTRATE 25 MG PO TABS: 25.0000 mg | ORAL_TABLET | Freq: Two times a day (BID) | ORAL | 1 refills | Status: DC

## 2020-10-18 NOTE — Telephone Encounter (Signed)
Call [facility] Pt needs an appt dr Lalla Brothers after pacemaker placed  How soon can she see him?

## 2020-10-18 NOTE — Telephone Encounter (Signed)
Spoke with [facility], Charity fundraiser. She stated that Amy Calhoun had reached out earlier today & that she had been called back, but message was left. She said that number for device clinic was in her VM & she would be able to schedule f/u with Dr. Lalla Brothers as well as a wound check. Since patient was in office I wrote number to device clinic down & provided patient's daughter with this.

## 2020-10-18 NOTE — Telephone Encounter (Signed)
When you call [facility] , please nurse to call pt as Amy Calhoun ( daughter) has questions about pacemaker incision site.   Per discharge paperwork, she cannot shower for 10 days until 10/22/20. Amy Calhoun isnt sure what type of incision or if has steri strips , staples.  Does she recover incision site if comes off? Does she remove if not off on 10/22/20?  How long does she use left arm brace and limit left arm activity? Paperworks states do not lift left arm above head for 1 week. It also states can remove sling 24 hours after procedure?

## 2020-10-18 NOTE — Telephone Encounter (Signed)
LMOVM for pt to call the device clinic. I was going to walk her through sending a transmission. It looks like the patient just need a nurse to call with discharge help and follow up appointments and wants to know when to shower.   Amy Calhoun the patient do not have a follow up appointment with Dr. Lalla Brothers or a wound check appointment.

## 2020-10-18 NOTE — Progress Notes (Signed)
Subjective:    Patient ID: Amy Calhoun, female    DOB: 01-10-1929, 87 y.o.   MRN: [id number]  CC: Amy Calhoun is a 87 y.o. female who presents today for follow up.   HPI: Accompanied by daughter, [name] No new concerns today.  Discharged yesterday from hospital.  Daughter does have questions about left arm sling and when she can take shower Eating and swallowing better since she been home. [name] giving her propel ( low sugar version).   Social worker has called [name] and order for PT has been placed.   She is on 02 and daughter unsure of length of time. Daughter states that O2 drops when walking. No sob with talking.  She is currently on 2L.    Weight today is stable. Using scooter currently. She is using walker very little. She is not using cane.   Compliant with Lasix 10 mg daily, lisinopril 2.5 mg daily, Toprol tartrate 25 mg twice daily  DM-FBG 248 this morning. [name] states eating is off and decreased. she continues to follow with Dr. Tedd Sias.   Compliant with lantus 8 units, lispro TID 8 units.  Denies hypoglycemic episodes    Admitted to hospital for bradycardia , syncope.  Discharged 10/17/2020 Diagnosis high grade AV block Left heart cath with placement of transvenous pacemaker 10/11/2020 followed by permanent pacemaker 10/12/2020.  Hospital course significant for AKI ( crt yesterday 1.24, prior 1.14) due to IV contrast from CTA and left heart catheterization. CXR 10/15/20  Worsening bilateral airspace opacities which may represent pulmonary edema versus infection. Echocardiogram 10/11/20 EF 60-65% CT angio 10/11/2020 negative for pulmonary embolism; shows aortic atherosclerosis, coronary artery atherosclerosis MRI brain 10/11/2020 no acute intracranial abnormality, findings of chronic ischemic microangiopathy EEG 10/11/20- no seizures. PT referral placed, not scheduled.  Discharged on 1 L/min oxygen for chronic hypoxemic respiratory failure.   10/10/2020 and discharged  yesterday   Permanent and temporary pacemaker placed by Dr. Lalla Brothers 10/12/2020 Left arm in sling, immobile.  Dr End added ASA 81mg  for secondary prevention and added metoprolol tartrate 25mg  bid   L[facility] at [facility]  Follow up cardiology 11/17/20 , Dr End; Dr [facility] 01/18/21 HISTORY:  Past Medical History:  Diagnosis Date   Allergy    Ambulates with cane    Anemia    At high risk for falls    Cataract    removed both eyes   Chronic kidney disease 08/07/2016   Chronic renal failure, stage III   Depression    Esophageal dysphagia    Gastric ulcer    GERD (gastroesophageal reflux disease)    Hyperlipidemia    Hypertension    Hypothyroidism    IDDM (insulin dependent diabetes mellitus)    Ischemic colitis (HCC)    Neuromuscular disorder (HCC)    neuropathy   Osteoarthritis    Ulcer of esophagus    Past Surgical History:  Procedure Laterality Date   ABDOMINAL HYSTERECTOMY     CATARACT EXTRACTION, BILATERAL     CHOLECYSTECTOMY     COLONOSCOPY  2010   ESOPHAGEAL DILATION  08/07/2016   usually a couple times a year, last time 07/30/16   GALLBLADDER SURGERY     LEFT HEART CATH AND CORONARY ANGIOGRAPHY N/A 10/11/2020   Procedure: LEFT HEART CATH AND CORONARY ANGIOGRAPHY;  Surgeon: 08/01/16, MD;  Location: [facility] INVASIVE CV LAB;  Service: Cardiovascular;  Laterality: N/A;   LUMBAR LAMINECTOMY/DECOMPRESSION MICRODISCECTOMY N/A 08/10/2016   Procedure: BILATERAL HEMILAMINECTOMY LUMBAR THREE-FOUR,LUMBAR FOUR-FIVE,LEFT LUMBAR FIVE-SACRAL  ONE HEMILAMINECTOMY AND DECOMPRESSION;  Surgeon: Coletta Memos, MD;  Location: [facility] OR;  Service: Neurosurgery;  Laterality: N/A;   PACEMAKER IMPLANT N/A 10/12/2020   Procedure: PACEMAKER IMPLANT;  Surgeon: Lanier Prude, MD;  Location: [facility] INVASIVE CV LAB;  Service: Cardiovascular;  Laterality: N/A;   TEMPORARY PACEMAKER N/A 10/11/2020   Procedure: TEMPORARY PACEMAKER;  Surgeon: Yvonne Kendall, MD;  Location: [facility] INVASIVE CV LAB;  Service:  Cardiovascular;  Laterality: N/A;   THYROIDECTOMY     UPPER GASTROINTESTINAL ENDOSCOPY     Family History  Problem Relation Age of Onset   Diabetes Mother    Arthritis Mother    Hyperlipidemia Mother    Mental illness Mother    Heart disease Father    Mental illness Sister    Arthritis Maternal Grandmother    Arthritis Maternal Grandfather    Colon cancer Neg Hx    Stomach cancer Neg Hx    Esophageal cancer Neg Hx    Rectal cancer Neg Hx    Colon polyps Neg Hx     Allergies: Pravachol [pravastatin] Current Outpatient Medications on File Prior to Visit  Medication Sig Dispense Refill   Acetaminophen-Codeine 300-30 MG tablet TAKE 1 TABLET BY MOUTH EVERY 12 (TWELVE) HOURS AS NEEDED FOR PAIN (MODERATE TO SEVERE PAIN). 60 tablet 0   albuterol (PROVENTIL HFA;VENTOLIN HFA) 108 (90 Base) MCG/ACT inhaler Inhale 2 puffs into the lungs every 6 (six) hours as needed for wheezing or shortness of breath. 1 Inhaler 2   amitriptyline (ELAVIL) 75 MG tablet TAKE 2 TABLETS AT BEDTIME 180 tablet 3   ascorbic acid (VITAMIN C) 500 MG tablet Take 500 mg by mouth daily.     Cholecalciferol (VITAMIN D) 2000 units tablet Take 2,000 Units by mouth daily.      Continuous Blood Gluc Sensor (FREESTYLE LIBRE 14 DAY SENSOR) MISC 1 Package by Does not apply route every 14 (fourteen) days. 6 each 3   cyanocobalamin (,VITAMIN B-12,) 1000 MCG/ML injection INJECT WEEKLY FOR 4 WEEKS THEN INJECT MONTHLY 3 mL 5   docusate sodium (COLACE) 100 MG capsule Take 1 capsule (100 mg total) by mouth 2 (two) times daily. 10 capsule 0   ezetimibe (ZETIA) 10 MG tablet Take 1 tablet (10 mg total) by mouth daily. 90 tablet 3   gabapentin (NEURONTIN) 100 MG capsule Take 2 capsules (200 mg total) by mouth 3 (three) times daily. 540 capsule 3   glucagon (GLUCAGON EMERGENCY) 1 MG injection Inject 1 mg into the vein once as needed. For hypoglycemic episodes. E11.42 1 each 3   Insulin Glargine (LANTUS SOLOSTAR) 100 UNIT/ML Solostar  Pen Inject 8 Units into the skin at bedtime. 15 mL 11   insulin lispro (HUMALOG KWIKPEN) 100 UNIT/ML KwikPen Inject 8 Units into the skin 3 (three) times daily. Max Daily 30 units 30 mL 3   Insulin Pen Needle 32G X 4 MM MISC 1 Device by Does not apply route in the morning, at noon, in the evening, and at bedtime. 150 each 11   Iron, Ferrous Sulfate, 325 (65 Fe) MG TABS Take 325 mg by mouth every other day. (Patient taking differently: Take 325 mg by mouth every other day. Takes daily) 90 tablet 2   levothyroxine (SYNTHROID) 150 MCG tablet TAKE 1 TABLET DAILY 90 tablet 3   Misc. Devices (BARIATRIC ROLLATOR) MISC Use as needed 1 each 0   omeprazole (PRILOSEC) 40 MG capsule TAKE 1 CAPSULE TWICE A DAY 180 capsule 3   ondansetron (ZOFRAN  ODT) 4 MG disintegrating tablet Take 1 tablet (4 mg total) by mouth every 8 (eight) hours as needed for nausea or vomiting. 30 tablet 0   pyridOXINE (VITAMIN B-6) 100 MG tablet Take 100 mg by mouth daily.     No current facility-administered medications on file prior to visit.    Social History   Tobacco Use   Smoking status: Never   Smokeless tobacco: Never  Vaping Use   Vaping Use: Never used  Substance Use Topics   Alcohol use: No   Drug use: No    Review of Systems  Constitutional:  Negative for chills and fever.  Respiratory:  Negative for cough and shortness of breath.   Cardiovascular:  Negative for chest pain and palpitations.  Gastrointestinal:  Negative for nausea and vomiting.     Objective:    BP 118/76   Pulse 69   Ht 5' 2.01" (1.575 m)   SpO2 97%   BMI 27.33 kg/m  BP Readings from Last 3 Encounters:  10/18/20 118/76  10/17/20 (!) 125/53  09/28/20 (!) 183/77   Wt Readings from Last 3 Encounters:  10/16/20 149 lb 7.6 oz (67.8 kg)  09/28/20 138 lb (62.6 kg)  09/21/20 132 lb 6.4 oz (60.1 kg)    Physical Exam Vitals reviewed.  Constitutional:      Appearance: She is well-developed.  Eyes:     Conjunctiva/sclera: Conjunctivae  normal.  Cardiovascular:     Rate and Rhythm: Normal rate and regular rhythm.     Pulses: Normal pulses.     Heart sounds: Normal heart sounds.     Comments: Wearing left arm sling. Pulmonary:     Effort: Pulmonary effort is normal.     Breath sounds: Normal breath sounds. No wheezing, rhonchi or rales.  Skin:    General: Skin is warm and dry.  Neurological:     Mental Status: She is alert.  Psychiatric:        Speech: Speech normal.        Behavior: Behavior normal.        Thought Content: Thought content normal.       Assessment & Plan:   Problem List Items Addressed This Visit       Cardiovascular and Mediastinum   Complete heart block (HCC) - Primary    Hospitalization course reviewed with daughter and patient for pacemaker placement.  Medications reconciled.  Patient swallowing without difficulty. No fluid volume overload. PT referral placed at discharge and social work has been in touch with daughter. Currently she is on 2 L oxygen and call outpatient now that discharge summary has posted ( after HFU with patient), I will call to patient to decrease to 1 L and continue  monitoring SaO2 at home.  Discharge summary didn't agrees 81mg  asa that Dr End had started in progress note 10/14/20. Will call patient to address as well.     Time spent coordinating care with cardiology as appointments not scheduled and patient had questions after pacemaker placed.   She has upcoming appointment with Dr. 10/16/20, cardiology next month and with Dr. Okey Dupre, EP January 2023. Blood pressure is well controlled . Continue Lasix 10 mg daily, lisinopril 2.5 mg daily, Toprol tartrate 25 mg twice daily.Pending recheck of renal indices in setting of AKI in 2 weeks time.       Relevant Medications   lisinopril (ZESTRIL) 2.5 MG tablet   metoprolol tartrate (LOPRESSOR) 25 MG tablet   furosemide (LASIX) 20 MG tablet  Other Relevant Orders   Basic metabolic panel   HTN (hypertension), malignant   Relevant  Medications   lisinopril (ZESTRIL) 2.5 MG tablet   metoprolol tartrate (LOPRESSOR) 25 MG tablet   furosemide (LASIX) 20 MG tablet     Endocrine   DM (diabetes mellitus), type 2 with renal complications (HCC)    Lab Results  Component Value Date   HGBA1C 9.4 (A) 09/21/2020  Uncontrolled. She is following with Dr Tedd Sias. Complicated by co morbities and labile blood sugar. Advised to maintain close follow up with Dr Tedd Sias. Continue lantus and lispro as prescribed by Dr Tedd Sias.      Relevant Medications   lisinopril (ZESTRIL) 2.5 MG tablet     Genitourinary   AKI (acute kidney injury) (HCC)   Relevant Orders   Basic metabolic panel     I am having [facility] maintain her pyridOXINE, glucagon, docusate sodium, albuterol, Vitamin D, ondansetron, Lantus SoloStar, Bariatric Rollator, FreeStyle Libre 14 Day Sensor, ascorbic acid, Insulin Pen Needle, levothyroxine, amitriptyline, Iron (Ferrous Sulfate), ezetimibe, cyanocobalamin, omeprazole, insulin lispro, Acetaminophen-Codeine, gabapentin, lisinopril, metoprolol tartrate, and furosemide.   Meds ordered this encounter  Medications   lisinopril (ZESTRIL) 2.5 MG tablet    Sig: Take 1 tablet (2.5 mg total) by mouth daily. Take for blood pressure greater than 140/90    Dispense:  90 tablet    Refill:  1    Order Specific Question:   Supervising Provider    Answer:   Duncan Dull L [[id number]]   metoprolol tartrate (LOPRESSOR) 25 MG tablet    Sig: Take 1 tablet (25 mg total) by mouth 2 (two) times daily.    Dispense:  120 tablet    Refill:  1    Order Specific Question:   Supervising Provider    Answer:   Duncan Dull L [[id number]]   furosemide (LASIX) 20 MG tablet    Sig: Take 0.5 tablets (10 mg total) by mouth daily.    Dispense:  30 tablet    Refill:  1    Order Specific Question:   Supervising Provider    Answer:   Sherlene Shams [[id number]]     Return precautions given.   Risks, benefits, and alternatives of the medications and  treatment plan prescribed today were discussed, and patient expressed understanding.   Education regarding symptom management and diagnosis given to patient on AVS.  Continue to follow with Allegra Grana, FNP for routine health maintenance.   Lakyn Mae [employer] and I agreed with plan.   Rennie Plowman, FNP

## 2020-10-18 NOTE — Telephone Encounter (Signed)
Patient daughter calling  Would like to know discharge information  If they will need a follow up appointment Would also like to know when patient can shower Please call to discuss

## 2020-10-18 NOTE — Telephone Encounter (Signed)
Amy Calhoun from [facility], [phone]. She set up a time with patient this Thursday, it is a 1 day out of the 48 hour range and just needed to notify provider.

## 2020-10-18 NOTE — Telephone Encounter (Signed)
FYI

## 2020-10-18 NOTE — Patient Instructions (Addendum)
Labs in one week  Gentle hydration as discussed.   We are working on appointment with Dr Lalla Brothers .   Let me know of any concerns

## 2020-10-19 ENCOUNTER — Ambulatory Visit (INDEPENDENT_AMBULATORY_CARE_PROVIDER_SITE_OTHER): Payer: Medicare HMO

## 2020-10-19 ENCOUNTER — Telehealth: Payer: Self-pay | Admitting: Family

## 2020-10-19 ENCOUNTER — Other Ambulatory Visit: Payer: Self-pay | Admitting: Podiatry

## 2020-10-19 DIAGNOSIS — I999 Unspecified disorder of circulatory system: Secondary | ICD-10-CM

## 2020-10-19 DIAGNOSIS — L89891 Pressure ulcer of other site, stage 1: Secondary | ICD-10-CM | POA: Diagnosis not present

## 2020-10-19 NOTE — Telephone Encounter (Signed)
noted 

## 2020-10-19 NOTE — Telephone Encounter (Signed)
I called and spoke with Amy Calhoun to let her know I called back [employer]. I spoke with Bjorn Loser & she should be reaching back out to [facility] to get delivery for tanks & concentrator set up. I also advised that she should try to reach out to social worker from the hospital that was assigned her mom's case. She stated that she did not feel that would be helpful just because they stated that her mom did not meet the qualifications. Amy Calhoun said that she was going to see if the home health nurse they had coming out would be able to help her mom more. Amy Calhoun was very busy trying to ger her mom to her appointment today & I said that if she needed anything on our end that she could reach back out.

## 2020-10-19 NOTE — Telephone Encounter (Signed)
I called and spoke with [name], patient's daughter. She was displeased with the fact that her mom could not carry a sling with small oxygen tank on her shoulder as well as use a cane with her left arm already in a sling. She said that she rides her scooter to the dining hall at assisted living facility daily & leaves scooter outside to walk with cane to get her food. I asked could she possible talk to patient's assisted living facility to have some type of accomodation made until patient had f/u with cardiologist & arm was out of the sling. She stated that she had talked to the manager, Tresa Endo at assisted living facility & they both felt that patient should have gone to some type of rehab before returning home to avoid this issue. She stated that Tresa Endo was going to try to contact someone at the hospital to see if she could be admitted to [facility] in front of her assisted living facility for rehab. She asked that I call [employer] to have them bring back out the concentrator as well as small tanks since this was all that was really available. There is nothing that doesn't requite either being carried or pushed. Per Claris Che she asked that I call [name] & suggest that she contact social worker from the hospital to sere if in patient rehab could be arranged.

## 2020-10-19 NOTE — Telephone Encounter (Signed)
Patient was given a large tank of oxygen when being discharged from the hospital.   They were supposed to deliver tanks for the house and a portable oxygen as well. They delivered yesterday a bunch of small tanks with a sling for these to be carried in and no portable oxygen. Patient's arm is also in a sling and the other arm holds her cane.   Because the daughter would not take the small tanks they did not leave the large home tank either.   The company is [employer]. They state they need a new order for the tank for the home and lightweight portable oxygen. States the hospital order states portable oxygen tanks.   Please advise

## 2020-10-20 ENCOUNTER — Telehealth: Payer: Self-pay | Admitting: Family

## 2020-10-20 NOTE — Telephone Encounter (Signed)
I spoke with Neysa Bonito from [place] & I advised that I really did not think you would mind patient having labs drawn at home. She is scheduled for fasting labs, but only BMP is ordered. What all is needed & I can call to give verbal.

## 2020-10-20 NOTE — Telephone Encounter (Signed)
Needing to know if okay to do the Patient's upcoming fasting labs 10/31/20 at home instead?   Christy with [facility].

## 2020-10-20 NOTE — Telephone Encounter (Signed)
LVM for patient's daughter [name].  Provided device clinic # to return call if she should still have questions.  It appears follow-up appt have been scheduled and  Discharge ppwk does include appropriate instructions.

## 2020-10-21 ENCOUNTER — Telehealth: Payer: Self-pay | Admitting: Cardiology

## 2020-10-21 ENCOUNTER — Telehealth: Payer: Self-pay

## 2020-10-21 NOTE — Telephone Encounter (Signed)
Discussed with Dr. Okey Dupre.  Per Dr. Leory Plowman should see gen cards in 3-4 weeks.  Scheduling aware.

## 2020-10-21 NOTE — Telephone Encounter (Signed)
PCP nurse called and states patient needs a hosp follow up within a week. Please advise if this appt is needed.

## 2020-10-21 NOTE — Telephone Encounter (Signed)
I called and spoke with Sabrina at [facility]. She stated that her 91 day f/u was protocol per insurance as well as pacemaker implant. Dr. Lovena Neighbours note did not reflect that follow-up in office was needed. She will message his nurse however to see if this is needed so that patient may scheduled accordingly.

## 2020-10-21 NOTE — Telephone Encounter (Signed)
Returned daughters call. Daughter states patient is doing well however she was concerned that pulse ox reading recorded patient's HR in the 40s. Discussed with daughter that pulse oximeters are not always accurate and that patients HR should never drop below 60 with current pacemaker settings. Daughter admits to being unsure and insecure with caring for mother and new device. Provided reassurance that remote transmission was normal. Daughter appreciative of call and education.

## 2020-10-21 NOTE — Telephone Encounter (Signed)
The patient daughter states her mom pulse has been up and down today. With the pulse ox machine Its been at 44 bpm, 107 bpm and she also checked the pulse with the blood pressure machine. I had her send a transmission with the monitor for the nurse to review. Transmission received. Amy called the patient yesterday to go over discharge instructions.

## 2020-10-21 NOTE — Telephone Encounter (Signed)
Amy Calhoun, call and please give verbal for CBC with differential as well as BMP I was able to see discharge notes from hospitalization and was advised she sees cardiology within a week.  Can you please call over to [facility] and see if there is any sooner appointments with another provider?  Please let Amy Calhoun know as well.

## 2020-10-23 ENCOUNTER — Telehealth: Payer: Self-pay | Admitting: Family

## 2020-10-23 NOTE — Assessment & Plan Note (Signed)
Lab Results  Component Value Date   HGBA1C 9.4 (A) 09/21/2020   Uncontrolled. She is following with Dr Tedd Sias. Complicated by co morbities and labile blood sugar. Advised to maintain close follow up with Dr Tedd Sias. Continue lantus and lispro as prescribed by Dr Tedd Sias.

## 2020-10-23 NOTE — Telephone Encounter (Signed)
Call patient Please let her know of the hospital discharge summary posted after visit and I have reviewed in detail.    Please confirm that she is on 81mg  asa; dr end cardiology started in hospital however wasn't sure if she was on  Also, discharge note states 1 L o2 not 2L. Patient can decrease as long as sa02 > 90% closer to 94%

## 2020-10-23 NOTE — Assessment & Plan Note (Addendum)
Hospitalization course reviewed with daughter and patient for pacemaker placement.  Medications reconciled.  Patient swallowing without difficulty. No fluid volume overload. PT referral placed at discharge and social work has been in touch with daughter. Currently she is on 2 L oxygen and call outpatient now that discharge summary has posted ( after HFU with patient), I will call to patient to decrease to 1 L and continue  monitoring SaO2 at home.  Discharge summary didn't agrees 81mg  asa that Dr End had started in progress note 10/14/20. Will call patient to address as well.     Time spent coordinating care with cardiology as appointments not scheduled and patient had questions after pacemaker placed.   She has upcoming appointment with Dr. 10/16/20, cardiology next month and with Dr. Okey Dupre, EP January 2023. Blood pressure is well controlled . Continue Lasix 10 mg daily, lisinopril 2.5 mg daily, Toprol tartrate 25 mg twice daily.Pending recheck of renal indices in setting of AKI in 2 weeks time.

## 2020-10-24 ENCOUNTER — Encounter: Payer: Self-pay | Admitting: Family

## 2020-10-24 NOTE — Telephone Encounter (Signed)
noted 

## 2020-10-24 NOTE — Telephone Encounter (Signed)
Noted  

## 2020-10-24 NOTE — Telephone Encounter (Signed)
FYI I spoke with daughter & patient had been on 1L of O2. I had forgotten to ask about ASA 81mg  when I asked for nurse at [facility] phone number. When I called her home health nurse [facility] to give her verbal for labs I asked about patient's current meds & she also did not have the aspirin listed, so she does not believe patient is taking. I did give her verbal orders & lab should be drawn tomorrow. Pt is scheduled with Dr. Neysa Bonito 11/3.

## 2020-10-27 ENCOUNTER — Ambulatory Visit (INDEPENDENT_AMBULATORY_CARE_PROVIDER_SITE_OTHER): Payer: Medicare HMO

## 2020-10-27 ENCOUNTER — Telehealth: Payer: Self-pay | Admitting: Internal Medicine

## 2020-10-27 ENCOUNTER — Encounter: Payer: Self-pay | Admitting: Internal Medicine

## 2020-10-27 DIAGNOSIS — I442 Atrioventricular block, complete: Secondary | ICD-10-CM | POA: Diagnosis not present

## 2020-10-27 LAB — CUP PACEART INCLINIC DEVICE CHECK
Battery Remaining Longevity: 85 mo
Battery Voltage: 3.01 V
Brady Statistic RA Percent Paced: 4.2 %
Brady Statistic RV Percent Paced: 99.95 %
Date Time Interrogation Session: 20221013134819
Implantable Lead Implant Date: 20220928
Implantable Lead Implant Date: 20220928
Implantable Lead Location: 753859
Implantable Lead Location: 753860
Implantable Pulse Generator Implant Date: 20220928
Lead Channel Impedance Value: 475 Ohm
Lead Channel Impedance Value: 550 Ohm
Lead Channel Pacing Threshold Amplitude: 0.75 V
Lead Channel Pacing Threshold Amplitude: 0.75 V
Lead Channel Pacing Threshold Amplitude: 1 V
Lead Channel Pacing Threshold Amplitude: 1 V
Lead Channel Pacing Threshold Pulse Width: 0.4 ms
Lead Channel Pacing Threshold Pulse Width: 0.4 ms
Lead Channel Pacing Threshold Pulse Width: 0.4 ms
Lead Channel Pacing Threshold Pulse Width: 0.4 ms
Lead Channel Sensing Intrinsic Amplitude: 2 mV
Lead Channel Setting Pacing Amplitude: 3.5 V
Lead Channel Setting Pacing Amplitude: 3.5 V
Lead Channel Setting Pacing Pulse Width: 0.4 ms
Lead Channel Setting Sensing Sensitivity: 4 mV
Pulse Gen Model: 2272
Pulse Gen Serial Number: 3942268

## 2020-10-27 NOTE — Telephone Encounter (Signed)
Appt for pt established for 10/21. Glucose meter information downloaded.

## 2020-10-27 NOTE — Telephone Encounter (Signed)
Pt is present, per daughter pt was admitted to the hospital diagnosed with CHF had a pacemaker placed. Wants to download meter while here and possible wants mom to be seen sooner than est Jan appt.

## 2020-10-27 NOTE — Patient Instructions (Addendum)
   After Your Pacemaker   Monitor your pacemaker site for redness, swelling, and drainage. Call the device clinic at [phone] if you experience these symptoms or fever/chills.  Your incision was closed with Steri-strips or staples:  You may shower 7 days after your procedure and wash your incision with soap and water. Avoid lotions, ointments, or perfumes over your incision until it is well-healed.  You may use a hot tub or a pool after your wound check appointment if the incision is completely closed.  Do not lift, push or pull greater than 10 pounds with the affected arm until 6 weeks after your procedure. There are no other restrictions in arm movement after your wound check appointment. November 9th     You may drive, unless driving has been restricted by your healthcare providers.  Your Pacemaker is MRI compatible.  Remote monitoring is used to monitor your pacemaker from home. This monitoring is scheduled every 91 days by our office. It allows [place] to keep an eye on the functioning of your device to ensure it is working properly. You will routinely see your Electrophysiologist annually (more often if necessary).

## 2020-10-27 NOTE — Progress Notes (Signed)
Wound check appointment. Steri-strips removed. Wound without redness or edema. Incision edges approximated, wound well healed. Normal device function. Thresholds, sensing, and impedances consistent with implant measurements. Device programmed at 3.5V/auto capture programmed on for extra safety margin until 3 month visit. Histogram distribution appropriate for patient and level of activity. No mode switches or high ventricular rates noted. Patient educated about wound care, arm mobility, lifting restrictions. ROV 01/18/2021 with Jeanie Cooks

## 2020-10-28 NOTE — Telephone Encounter (Signed)
Patient moved to 11/24/2020 and daughter states she has been taking medication correctly.

## 2020-10-31 ENCOUNTER — Other Ambulatory Visit: Payer: Medicare HMO

## 2020-11-02 ENCOUNTER — Ambulatory Visit: Payer: Medicare HMO | Admitting: Family

## 2020-11-03 ENCOUNTER — Encounter: Payer: Self-pay | Admitting: Family

## 2020-11-03 ENCOUNTER — Telehealth: Payer: Self-pay | Admitting: Family

## 2020-11-03 DIAGNOSIS — I442 Atrioventricular block, complete: Secondary | ICD-10-CM

## 2020-11-03 DIAGNOSIS — R29898 Other symptoms and signs involving the musculoskeletal system: Secondary | ICD-10-CM

## 2020-11-03 NOTE — Telephone Encounter (Signed)
Patients daughter is returning your call,please call her at [phone].

## 2020-11-03 NOTE — Telephone Encounter (Signed)
I called daughter to see what phone call was regarding. Amy Calhoun stated that it has been 17 days since patient was released from hospital & things are not going well. She has fallen twice, is very forgetful & goes to dining hall without her cane or teeth, she also is very forgetful & not taking her morning meds. Amy Calhoun has been sick this week so patient's week has been even more rough. Tresa Endo the manager at the assisted living place where she lives is still trying to see if patient can go to rehab at [facility], which is directly in front of assisted living area patient currently lives. So far nothing has happened with this. I asked if she has reached out to social worker assigned at hospital to patient. She said that she did try to call & was just told that she this social worker had gone home for the day. She wants patient to go to rehab for 30 days to help her get stronger. I stated that I was unsure what could or needed to be done on our end or if a referral for social work needed to be placed? Amy Calhoun hope that something can be done since it has not been but 17 days since she was seen. Please advise on thoughts?

## 2020-11-03 NOTE — Telephone Encounter (Signed)
Patients daughter is calling in about her mother.Asked patient the reason for the call,patients daughter did not state the reason.She is requesting a call back at [phone].

## 2020-11-04 ENCOUNTER — Ambulatory Visit: Payer: Medicare HMO | Admitting: Internal Medicine

## 2020-11-04 ENCOUNTER — Telehealth: Payer: Self-pay | Admitting: Family

## 2020-11-04 NOTE — Telephone Encounter (Signed)
noted 

## 2020-11-04 NOTE — Telephone Encounter (Signed)
LMTCB

## 2020-11-04 NOTE — Telephone Encounter (Signed)
[name], [place] called to let her know that patient is falling 2x in the last 2 weeks and confusion. She has to move to another place with higher care. Also, no one has called about patient's labs from last Friday, 10/28/2020. Drawn by home health nurse an dropped of at [facility]. Please call [name] at [phone].

## 2020-11-04 NOTE — Telephone Encounter (Signed)
FYI. I spoke with patient's daughter [name], she stated that Tresa Endo the Production designer, theatre/television/film at [facility] was able to get the ball rolling on patient getting into rehab there. There is an open bed & [facility] has been contacted, but they are awaiting their response to get patient in ASAP. I let her know that you had placed referral & someone from [facility] would be reaching out, but was unsure of this time frame or if this would even be needed.

## 2020-11-04 NOTE — Telephone Encounter (Signed)
Amy Calhoun  Daughter looks to have called [place] back. Can you call her and let her know that I placed referral to social work to assistant in rehab placement  [name], how soon can we get social work involved?

## 2020-11-05 ENCOUNTER — Emergency Department: Payer: Medicare HMO

## 2020-11-05 ENCOUNTER — Other Ambulatory Visit: Payer: Self-pay

## 2020-11-05 ENCOUNTER — Emergency Department
Admission: EM | Admit: 2020-11-05 | Discharge: 2020-11-08 | Disposition: A | Discharge status: Home / Self Care | Payer: Medicare HMO | Attending: Emergency Medicine | Admitting: Emergency Medicine

## 2020-11-05 DIAGNOSIS — N1831 Chronic kidney disease, stage 3a: Secondary | ICD-10-CM | POA: Insufficient documentation

## 2020-11-05 DIAGNOSIS — E1122 Type 2 diabetes mellitus with diabetic chronic kidney disease: Secondary | ICD-10-CM | POA: Diagnosis not present

## 2020-11-05 DIAGNOSIS — Z950 Presence of cardiac pacemaker: Secondary | ICD-10-CM | POA: Insufficient documentation

## 2020-11-05 DIAGNOSIS — Z79899 Other long term (current) drug therapy: Secondary | ICD-10-CM | POA: Diagnosis not present

## 2020-11-05 DIAGNOSIS — W010XXA Fall on same level from slipping, tripping and stumbling without subsequent striking against object, initial encounter: Secondary | ICD-10-CM | POA: Insufficient documentation

## 2020-11-05 DIAGNOSIS — R0789 Other chest pain: Secondary | ICD-10-CM | POA: Insufficient documentation

## 2020-11-05 DIAGNOSIS — Z794 Long term (current) use of insulin: Secondary | ICD-10-CM | POA: Insufficient documentation

## 2020-11-05 DIAGNOSIS — E039 Hypothyroidism, unspecified: Secondary | ICD-10-CM | POA: Diagnosis not present

## 2020-11-05 DIAGNOSIS — I130 Hypertensive heart and chronic kidney disease with heart failure and stage 1 through stage 4 chronic kidney disease, or unspecified chronic kidney disease: Secondary | ICD-10-CM | POA: Insufficient documentation

## 2020-11-05 DIAGNOSIS — I5033 Acute on chronic diastolic (congestive) heart failure: Secondary | ICD-10-CM | POA: Diagnosis not present

## 2020-11-05 DIAGNOSIS — N390 Urinary tract infection, site not specified: Secondary | ICD-10-CM | POA: Insufficient documentation

## 2020-11-05 DIAGNOSIS — Z8616 Personal history of COVID-19: Secondary | ICD-10-CM | POA: Insufficient documentation

## 2020-11-05 DIAGNOSIS — M25552 Pain in left hip: Secondary | ICD-10-CM | POA: Insufficient documentation

## 2020-11-05 DIAGNOSIS — W19XXXA Unspecified fall, initial encounter: Secondary | ICD-10-CM

## 2020-11-05 DIAGNOSIS — S0990XA Unspecified injury of head, initial encounter: Secondary | ICD-10-CM | POA: Diagnosis not present

## 2020-11-05 DIAGNOSIS — Z20822 Contact with and (suspected) exposure to covid-19: Secondary | ICD-10-CM | POA: Diagnosis not present

## 2020-11-05 LAB — CBC WITH DIFFERENTIAL/PLATELET
Abs Immature Granulocytes: 0.02 10*3/uL (ref 0.00–0.07)
Basophils Absolute: 0 10*3/uL (ref 0.0–0.1)
Basophils Relative: 1 %
Eosinophils Absolute: 0.2 10*3/uL (ref 0.0–0.5)
Eosinophils Relative: 2 %
HCT: 33.7 % — ABNORMAL LOW (ref 36.0–46.0)
Hemoglobin: 10.9 g/dL — ABNORMAL LOW (ref 12.0–15.0)
Immature Granulocytes: 0 %
Lymphocytes Relative: 25 %
Lymphs Abs: 2.2 10*3/uL (ref 0.7–4.0)
MCH: 30.7 pg (ref 26.0–34.0)
MCHC: 32.3 g/dL (ref 30.0–36.0)
MCV: 94.9 fL (ref 80.0–100.0)
Monocytes Absolute: 0.7 10*3/uL (ref 0.1–1.0)
Monocytes Relative: 8 %
Neutro Abs: 5.6 10*3/uL (ref 1.7–7.7)
Neutrophils Relative %: 64 %
Platelets: 208 10*3/uL (ref 150–400)
RBC: 3.55 MIL/uL — ABNORMAL LOW (ref 3.87–5.11)
RDW: 13.3 % (ref 11.5–15.5)
WBC: 8.6 10*3/uL (ref 4.0–10.5)
nRBC: 0 % (ref 0.0–0.2)

## 2020-11-05 LAB — TROPONIN I (HIGH SENSITIVITY)
Troponin I (High Sensitivity): 12 ng/L (ref <18)
Troponin I (High Sensitivity): 9 ng/L (ref <18)

## 2020-11-05 LAB — BASIC METABOLIC PANEL
Anion gap: 9 (ref 5–15)
BUN: 34 mg/dL — ABNORMAL HIGH (ref 8–23)
CO2: 27 mmol/L (ref 22–32)
Calcium: 8.4 mg/dL — ABNORMAL LOW (ref 8.9–10.3)
Chloride: 98 mmol/L (ref 98–111)
Creatinine, Ser: 1.13 mg/dL — ABNORMAL HIGH (ref 0.44–1.00)
GFR, Estimated: 46 mL/min — ABNORMAL LOW (ref >60)
Glucose, Bld: 310 mg/dL — ABNORMAL HIGH (ref 70–99)
Potassium: 4.9 mmol/L (ref 3.5–5.1)
Sodium: 134 mmol/L — ABNORMAL LOW (ref 135–145)

## 2020-11-05 LAB — RESP PANEL BY RT-PCR (FLU A&B, COVID) ARPGX2
Influenza A by PCR: NEGATIVE
Influenza B by PCR: NEGATIVE
SARS Coronavirus 2 by RT PCR: NEGATIVE

## 2020-11-05 LAB — T4, FREE: Free T4: 1.23 ng/dL — ABNORMAL HIGH (ref 0.61–1.12)

## 2020-11-05 LAB — TSH: TSH: 5.251 u[IU]/mL — ABNORMAL HIGH (ref 0.350–4.500)

## 2020-11-05 LAB — CBG MONITORING, ED: Glucose-Capillary: 181 mg/dL — ABNORMAL HIGH (ref 70–99)

## 2020-11-05 MED ORDER — ACETAMINOPHEN 500 MG PO TABS
1000.0000 mg | ORAL_TABLET | Freq: Once | ORAL | Status: AC
  Administered 2020-11-05: 1000 mg via ORAL
  Filled 2020-11-05: qty 2

## 2020-11-05 MED ORDER — AMITRIPTYLINE HCL 50 MG PO TABS
150.0000 mg | ORAL_TABLET | Freq: Every day | ORAL | Status: DC
  Administered 2020-11-05 – 2020-11-07 (×3): 150 mg via ORAL
  Filled 2020-11-05 (×3): qty 3

## 2020-11-05 MED ORDER — DOCUSATE SODIUM 100 MG PO CAPS
100.0000 mg | ORAL_CAPSULE | Freq: Two times a day (BID) | ORAL | Status: DC
  Administered 2020-11-05 – 2020-11-08 (×5): 100 mg via ORAL
  Filled 2020-11-05 (×5): qty 1

## 2020-11-05 MED ORDER — INSULIN ASPART 100 UNIT/ML IJ SOLN
8.0000 [IU] | Freq: Three times a day (TID) | INTRAMUSCULAR | Status: DC
  Administered 2020-11-05 – 2020-11-07 (×5): 8 [IU] via SUBCUTANEOUS
  Filled 2020-11-05 (×7): qty 1

## 2020-11-05 MED ORDER — LEVOTHYROXINE SODIUM 50 MCG PO TABS
150.0000 ug | ORAL_TABLET | Freq: Every day | ORAL | Status: DC
  Administered 2020-11-06 – 2020-11-08 (×3): 150 ug via ORAL
  Filled 2020-11-05 (×3): qty 3

## 2020-11-05 MED ORDER — ALBUTEROL SULFATE (2.5 MG/3ML) 0.083% IN NEBU: 2.5000 mg | INHALATION_SOLUTION | Freq: Four times a day (QID) | RESPIRATORY_TRACT | Status: DC | PRN

## 2020-11-05 MED ORDER — PANTOPRAZOLE SODIUM 40 MG PO TBEC
40.0000 mg | DELAYED_RELEASE_TABLET | Freq: Every day | ORAL | Status: DC
  Administered 2020-11-05 – 2020-11-08 (×4): 40 mg via ORAL
  Filled 2020-11-05 (×4): qty 1

## 2020-11-05 MED ORDER — ONDANSETRON 4 MG PO TBDP: 4.0000 mg | ORAL_TABLET | Freq: Three times a day (TID) | ORAL | Status: DC | PRN

## 2020-11-05 MED ORDER — GABAPENTIN 100 MG PO CAPS
200.0000 mg | ORAL_CAPSULE | Freq: Three times a day (TID) | ORAL | Status: DC
  Administered 2020-11-06 – 2020-11-08 (×7): 200 mg via ORAL
  Filled 2020-11-05 (×7): qty 2

## 2020-11-05 MED ORDER — FERROUS SULFATE 325 (65 FE) MG PO TABS
325.0000 mg | ORAL_TABLET | Freq: Every day | ORAL | Status: DC
  Administered 2020-11-06 – 2020-11-08 (×3): 325 mg via ORAL
  Filled 2020-11-05 (×3): qty 1

## 2020-11-05 MED ORDER — VITAMIN B-6 50 MG PO TABS
100.0000 mg | ORAL_TABLET | Freq: Every day | ORAL | Status: DC
  Administered 2020-11-06 – 2020-11-08 (×3): 100 mg via ORAL
  Filled 2020-11-05 (×4): qty 2

## 2020-11-05 MED ORDER — METOPROLOL TARTRATE 25 MG PO TABS
25.0000 mg | ORAL_TABLET | Freq: Two times a day (BID) | ORAL | Status: DC
  Administered 2020-11-06 – 2020-11-08 (×5): 25 mg via ORAL
  Filled 2020-11-05 (×5): qty 1

## 2020-11-05 MED ORDER — INSULIN GLARGINE-YFGN 100 UNIT/ML ~~LOC~~ SOLN
8.0000 [IU] | Freq: Every day | SUBCUTANEOUS | Status: DC
  Administered 2020-11-06: 8 [IU] via SUBCUTANEOUS
  Filled 2020-11-05 (×3): qty 0.08

## 2020-11-05 MED ORDER — ACETAMINOPHEN-CODEINE #3 300-30 MG PO TABS
1.0000 | ORAL_TABLET | Freq: Two times a day (BID) | ORAL | Status: DC | PRN
  Administered 2020-11-07: 1 via ORAL
  Filled 2020-11-05 (×3): qty 1

## 2020-11-05 MED ORDER — VITAMIN D 25 MCG (1000 UNIT) PO TABS
2000.0000 [IU] | ORAL_TABLET | Freq: Every day | ORAL | Status: DC
  Administered 2020-11-06 – 2020-11-08 (×3): 2000 [IU] via ORAL
  Filled 2020-11-05 (×3): qty 2

## 2020-11-05 MED ORDER — ASCORBIC ACID 500 MG PO TABS
500.0000 mg | ORAL_TABLET | Freq: Every day | ORAL | Status: DC
  Administered 2020-11-05 – 2020-11-08 (×4): 500 mg via ORAL
  Filled 2020-11-05 (×4): qty 1

## 2020-11-05 MED ORDER — LISINOPRIL 5 MG PO TABS
2.5000 mg | ORAL_TABLET | Freq: Every day | ORAL | Status: DC
  Administered 2020-11-06 – 2020-11-08 (×3): 2.5 mg via ORAL
  Filled 2020-11-05 (×3): qty 1

## 2020-11-05 MED ORDER — FUROSEMIDE 20 MG PO TABS
10.0000 mg | ORAL_TABLET | Freq: Every day | ORAL | Status: DC
  Administered 2020-11-06 – 2020-11-08 (×3): 10 mg via ORAL
  Filled 2020-11-05 (×4): qty 0.5

## 2020-11-05 MED ORDER — EZETIMIBE 10 MG PO TABS
10.0000 mg | ORAL_TABLET | Freq: Every day | ORAL | Status: DC
  Administered 2020-11-06 – 2020-11-08 (×3): 10 mg via ORAL
  Filled 2020-11-05 (×4): qty 1

## 2020-11-05 NOTE — TOC Initial Note (Addendum)
Transition of Care [facility]) - Initial/Assessment Note    Patient Details  Name: Amy Calhoun MRN: [id number] Date of Birth: 1928/06/15  [facility]) CM/SW Contact:    Elliot Gurney [place], [place] Phone Number:[phone] 11/05/2020, 4:58 PM  Clinical Narrative:                  CSW met with patient and daughter/POA Murlean Caller at bedside. Patient lives alone at [facility]. Patient's PCP is Mable Paris, FNP. Pharmacy [facility]. Patient has a RW, cane, and scooter at home. Patient's daughter available for transportation to medical appointments but is not able to provide 24 hour care due to her own medical issues. Patient and patient's daughter agreeable to SNF if recommended. Facility preference [facility].  Transition of Care to continue to follow  [facility], LCSW Transition of Care [phone]    Barriers to Discharge: Continued Medical Work up   Patient Goals and CMS Choice Patient states their goals for this hospitalization and ongoing recovery are:: "I guess I am ok with going to rehab" CMS Medicare.gov Compare Post Acute Care list provided to:: Patient    Expected Discharge Plan and Services   In-house Referral: Clinical Social Work   Post Acute Care Choice: [place] (PT/OT evaluation pending)                   DME Arranged:  (oxygen 1 liter-[facility])                    Prior Living Arrangements/Services   Lives with:: Self Patient language and need for interpreter reviewed:: Yes        Need for Family Participation in Patient Care: Yes (Comment) Care giver support system in place?: Yes (comment) Current home services: DME, Home PT, Home OT (cain, rollator waler, motorized scooter, bed side commode  [facility] through [place]) Criminal Activity/Legal Involvement Pertinent to Current Situation/Hospitalization: No - Comment as needed  Activities of  Daily Living      Permission Sought/Granted            Permission granted to share info w Relationship: Leane Para [phone]  Permission granted to share info w Contact Information: [phone]  Emotional Assessment Appearance:: Appears stated age Attitude/Demeanor/Rapport: Gracious Affect (typically observed): Adaptable, Accepting Orientation: : Oriented to Self, Oriented to Place, Oriented to  Time, Oriented to Situation Alcohol / Substance Use: Not Applicable Psych Involvement: No (comment)  Admission diagnosis:  Fall-L hip pain  Patient Active Problem List   Diagnosis Date Noted   Acute on chronic diastolic CHF (congestive heart failure) (HCC) 10/15/2020   Malnutrition of moderate degree 10/12/2020   Complete heart block (HCC)    Symptomatic bradycardia 10/10/2020   Syncope 10/10/2020   Redness of skin 06/29/2020   Recurrent falls 06/06/2020   Abnormal gait 06/06/2020   Dizziness 06/06/2020   Right wrist pain 03/18/2020   B12 deficiency 03/11/2020   Type 2 diabetes mellitus with hyperglycemia, with long-term current use of insulin ([place]) 11/16/2019   Type 2 diabetes mellitus with stage 3a chronic kidney disease, with long-term current use of insulin ([place]) 11/16/2019   DM type 2 with diabetic peripheral neuropathy ([place]) 11/16/2019   Depression    Weakness of both lower extremities    Fatigue 02/13/2019   Head injury 02/04/2019   Acute hypoxemic respiratory failure due to COVID-19 ([place]) 01/28/2019   Gastroesophageal reflux disease without esophagitis  Insomnia 01/05/2019   Constipation 06/02/2018   Acute pain of left shoulder 05/23/2018   Diarrhea 05/23/2018   Pneumatosis coli 07/29/2017   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   Memory disturbance 03/27/2017   Recurrent urinary tract infection 10/17/2016   Lumbar stenosis with neurogenic claudication 08/10/2016   Anemia 07/06/2016   AKI (acute kidney injury) ([place]) 04/05/2016   Shortness of breath 03/31/2016    Osteoarthritis of spine with radiculopathy, lumbar region 03/16/2016   History of colitis 03/02/2016   Primary osteoarthritis of left hip 03/02/2016   CKD (chronic kidney disease) stage 3, GFR 30-59 ml/min ([place]) 02/21/2016   DM (diabetes mellitus), type 2 with renal complications ([place]) 35/78/9784   Hyperlipidemia 01/19/2016   Hypothyroidism 01/19/2016   HTN (hypertension), malignant 01/19/2016   Chronic pain 01/19/2016   Esophageal dysmotility 12/21/2015   PCP:  Burnard Hawthorne, FNP [id number] [id number]: [phone]: [phone]    Social Determinants of Health (SDOH) Interventions    Readmission Risk Interventions No flowsheet data found.

## 2020-11-05 NOTE — ED Provider Notes (Signed)
Emergency Medicine Provider Triage Evaluation Note  Amy Calhoun , a 87 y.o. female  was evaluated in triage.  Pt complains of left rib pain and hip pain after fall while getting out of the shower this morning. She states she tripped. Denies loss of consciousness. Recent pacemaker insertion.  Review of Systems  Positive: Hip, rib pain Negative: Loss of consciousness.  Physical Exam  There were no vitals taken for this visit. Gen:   Awake, no distress   Resp:  Normal effort  MSK:   Moves extremities without difficulty  Other:   Medical Decision Making  Medically screening exam initiated at 12:02 PM.  Appropriate orders placed.  Amy Calhoun was informed that the remainder of the evaluation will be completed by another provider, this initial triage assessment does not replace that evaluation, and the importance of remaining in the ED until their evaluation is complete.   Chinita Pester, FNP 11/05/20 1205    Merwyn Katos, MD 11/05/20 [phone]

## 2020-11-05 NOTE — ED Provider Notes (Signed)
[facility] Emergency Department Provider Note  ____________________________________________   Event Date/Time   First MD Initiated Contact with Patient 11/05/20 1456     (approximate)  I have reviewed the triage vital signs and the nursing notes.   HISTORY  Chief Complaint Hip Pain    HPI Amy Calhoun is a 87 y.o. female with history of complete heart block status post pacemaker who comes in with concerns for mechanical fall.  Patient reports he had a shower and having a mechanical fall.  There is no dizziness or other factors preceding this fall.  She fell onto her left hip and rib area denies any numbness or tingling.  Patient reports that she is not sure why she fell.  Although the daughter is in the room and states that she thinks it was because she tripped over the lip of the shower.  But is not exactly sure.  Daughter is very dissatisfied stating that she was admitted to the hospital and discharged on 10/3 but was discharged back to home.  She states that since being home she has fallen 3 times and that she should never have been discharged home.  Her left arm was in a sling and she was placed on oxygen and daughter stated that it was very hard for her to ambulate with a cane with the portable oxygen.  She states that she is in an independent living facility and PT did not come for over a week to start with her sessions and that she needs to be at a place that can provide more care for her.  From the fall today patient is reporting some left-sided chest pain, constant, nothing makes it better, worse with palpation.  Unclear if she hit her head or if she lost consciousness     Past Medical History:  Diagnosis Date   Allergy    Ambulates with cane    Anemia    At high risk for falls    Cataract    removed both eyes   Chronic kidney disease 08/07/2016   Chronic renal failure, stage III   Depression    Esophageal dysphagia    Gastric ulcer     GERD (gastroesophageal reflux disease)    Hyperlipidemia    Hypertension    Hypothyroidism    IDDM (insulin dependent diabetes mellitus)    Ischemic colitis (HCC)    Neuromuscular disorder (HCC)    neuropathy   Osteoarthritis    Ulcer of esophagus     Patient Active Problem List   Diagnosis Date Noted   Acute on chronic diastolic CHF (congestive heart failure) (HCC) 10/15/2020   Malnutrition of moderate degree 10/12/2020   Complete heart block (HCC)    Symptomatic bradycardia 10/10/2020   Syncope 10/10/2020   Redness of skin 06/29/2020   Recurrent falls 06/06/2020   Abnormal gait 06/06/2020   Dizziness 06/06/2020   Right wrist pain 03/18/2020   B12 deficiency 03/11/2020   Type 2 diabetes mellitus with hyperglycemia, with long-term current use of insulin (HCC) 11/16/2019   Type 2 diabetes mellitus with stage 3a chronic kidney disease, with long-term current use of insulin (HCC) 11/16/2019   DM type 2 with diabetic peripheral neuropathy (HCC) 11/16/2019   Depression    Weakness of both lower extremities    Fatigue 02/13/2019   Head injury 02/04/2019   Acute hypoxemic respiratory failure due to COVID-19 (HCC) 01/28/2019   Gastroesophageal reflux disease without esophagitis    Insomnia 01/05/2019  Constipation 06/02/2018   Acute pain of left shoulder 05/23/2018   Diarrhea 05/23/2018   Pneumatosis coli 07/29/2017   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   Memory disturbance 03/27/2017   Recurrent urinary tract infection 10/17/2016   Lumbar stenosis with neurogenic claudication 08/10/2016   Anemia 07/06/2016   AKI (acute kidney injury) (HCC) 04/05/2016   Shortness of breath 03/31/2016   Osteoarthritis of spine with radiculopathy, lumbar region 03/16/2016   History of colitis 03/02/2016   Primary osteoarthritis of left hip 03/02/2016   CKD (chronic kidney disease) stage 3, GFR 30-59 ml/min (HCC) 02/21/2016   DM (diabetes mellitus), type 2 with renal complications (HCC) 01/19/2016    Hyperlipidemia 01/19/2016   Hypothyroidism 01/19/2016   HTN (hypertension), malignant 01/19/2016   Chronic pain 01/19/2016   Esophageal dysmotility 12/21/2015    Past Surgical History:  Procedure Laterality Date   ABDOMINAL HYSTERECTOMY     CATARACT EXTRACTION, BILATERAL     CHOLECYSTECTOMY     COLONOSCOPY  2010   ESOPHAGEAL DILATION  08/07/2016   usually a couple times a year, last time 07/30/16   GALLBLADDER SURGERY     LEFT HEART CATH AND CORONARY ANGIOGRAPHY N/A 10/11/2020   Procedure: LEFT HEART CATH AND CORONARY ANGIOGRAPHY;  Surgeon: Yvonne Kendall, MD;  Location: [facility] INVASIVE CV LAB;  Service: Cardiovascular;  Laterality: N/A;   LUMBAR LAMINECTOMY/DECOMPRESSION MICRODISCECTOMY N/A 08/10/2016   Procedure: BILATERAL HEMILAMINECTOMY LUMBAR THREE-FOUR,LUMBAR FOUR-FIVE,LEFT LUMBAR FIVE-SACRAL ONE HEMILAMINECTOMY AND DECOMPRESSION;  Surgeon: Coletta Memos, MD;  Location: [facility] OR;  Service: Neurosurgery;  Laterality: N/A;   PACEMAKER IMPLANT N/A 10/12/2020   Procedure: PACEMAKER IMPLANT;  Surgeon: Lanier Prude, MD;  Location: [facility] INVASIVE CV LAB;  Service: Cardiovascular;  Laterality: N/A;   TEMPORARY PACEMAKER N/A 10/11/2020   Procedure: TEMPORARY PACEMAKER;  Surgeon: Yvonne Kendall, MD;  Location: [facility] INVASIVE CV LAB;  Service: Cardiovascular;  Laterality: N/A;   THYROIDECTOMY     UPPER GASTROINTESTINAL ENDOSCOPY      Prior to Admission medications   Medication Sig Start Date End Date Taking? Authorizing Provider  Acetaminophen-Codeine 300-30 MG tablet TAKE 1 TABLET BY MOUTH EVERY 12 (TWELVE) HOURS AS NEEDED FOR PAIN (MODERATE TO SEVERE PAIN). 09/27/20   Dale Vivian, MD  albuterol (PROVENTIL HFA;VENTOLIN HFA) 108 (90 Base) MCG/ACT inhaler Inhale 2 puffs into the lungs every 6 (six) hours as needed for wheezing or shortness of breath. 01/07/17   Allegra Grana, FNP  amitriptyline (ELAVIL) 75 MG tablet TAKE 2 TABLETS AT BEDTIME 03/11/20   Allegra Grana, FNP   ascorbic acid (VITAMIN C) 500 MG tablet Take 500 mg by mouth daily.    [provider]  Cholecalciferol (VITAMIN D) 2000 units tablet Take 2,000 Units by mouth daily.     [provider]  Continuous Blood Gluc Sensor (FREESTYLE LIBRE 14 DAY SENSOR) MISC 1 Package by Does not apply route every 14 (fourteen) days. 09/03/19   Shamleffer, Konrad Dolores, MD  cyanocobalamin (,VITAMIN B-12,) 1000 MCG/ML injection INJECT WEEKLY FOR 4 WEEKS THEN INJECT MONTHLY 07/13/20   Allegra Grana, FNP  docusate sodium (COLACE) 100 MG capsule Take 1 capsule (100 mg total) by mouth 2 (two) times daily. 03/31/16   Katharina Caper, MD  ezetimibe (ZETIA) 10 MG tablet Take 1 tablet (10 mg total) by mouth daily. 06/29/20   Allegra Grana, FNP  furosemide (LASIX) 20 MG tablet Take 0.5 tablets (10 mg total) by mouth daily. 10/18/20 12/17/20  Allegra Grana, FNP  gabapentin (NEURONTIN) 100 MG  capsule Take 2 capsules (200 mg total) by mouth 3 (three) times daily. 10/17/20   Lurene Shadow, MD  glucagon (GLUCAGON EMERGENCY) 1 MG injection Inject 1 mg into the vein once as needed. For hypoglycemic episodes. E11.42 03/13/16   Tommie Sams, DO  Insulin Glargine (LANTUS SOLOSTAR) 100 UNIT/ML Solostar Pen Inject 8 Units into the skin at bedtime. 01/01/19   Shamleffer, Konrad Dolores, MD  insulin lispro (HUMALOG KWIKPEN) 100 UNIT/ML KwikPen Inject 8 Units into the skin 3 (three) times daily. Max Daily 30 units 09/21/20   Shamleffer, Konrad Dolores, MD  Insulin Pen Needle 32G X 4 MM MISC 1 Device by Does not apply route in the morning, at noon, in the evening, and at bedtime. 11/13/19   Shamleffer, Konrad Dolores, MD  Iron, Ferrous Sulfate, 325 (65 Fe) MG TABS Take 325 mg by mouth every other day. Patient taking differently: Take 325 mg by mouth every other day. Takes daily 05/26/20   McLean-Scocuzza, Pasty Spillers, MD  levothyroxine (SYNTHROID) 150 MCG tablet TAKE 1 TABLET DAILY 01/28/20   Allegra Grana, FNP   lisinopril (ZESTRIL) 2.5 MG tablet Take 1 tablet (2.5 mg total) by mouth daily. Take for blood pressure greater than 140/90 10/18/20   Allegra Grana, FNP  metoprolol tartrate (LOPRESSOR) 25 MG tablet Take 1 tablet (25 mg total) by mouth 2 (two) times daily. 10/18/20   Allegra Grana, FNP  Misc. Devices (BARIATRIC ROLLATOR) MISC Use as needed 07/31/19   Allegra Grana, FNP  omeprazole (PRILOSEC) 40 MG capsule TAKE 1 CAPSULE TWICE A DAY 07/25/20   Rachael Fee, MD  ondansetron (ZOFRAN ODT) 4 MG disintegrating tablet Take 1 tablet (4 mg total) by mouth every 8 (eight) hours as needed for nausea or vomiting. 08/21/18   Tracey Harries, FNP  pyridOXINE (VITAMIN B-6) 100 MG tablet Take 100 mg by mouth daily.    [provider]    Allergies Pravachol [pravastatin]  Family History  Problem Relation Age of Onset   Diabetes Mother    Arthritis Mother    Hyperlipidemia Mother    Mental illness Mother    Heart disease Father    Mental illness Sister    Arthritis Maternal Grandmother    Arthritis Maternal Grandfather    Colon cancer Neg Hx    Stomach cancer Neg Hx    Esophageal cancer Neg Hx    Rectal cancer Neg Hx    Colon polyps Neg Hx     Social History Social History   Tobacco Use   Smoking status: Never   Smokeless tobacco: Never  Vaping Use   Vaping Use: Never used  Substance Use Topics   Alcohol use: No   Drug use: No      Review of Systems Constitutional: No fever/chills, fall  Eyes: No visual changes. ENT: No sore throat. Cardiovascular: Chest wall pain Respiratory: Denies shortness of breath. Gastrointestinal: No abdominal pain.  No nausea, no vomiting.  No diarrhea.  No constipation. Genitourinary: Negative for dysuria. Musculoskeletal: Negative for back pain. Skin: Negative for rash. Neurological: Negative for headaches, focal weakness or numbness. All other ROS negative ____________________________________________   PHYSICAL  EXAM:  VITAL SIGNS: ED Triage Vitals  Enc Vitals Group     BP 11/05/20 1204 (!) 109/46     Pulse Rate 11/05/20 1204 60     Resp 11/05/20 1204 18     Temp 11/05/20 1204 97.7 F (36.5 C)     Temp Source 11/05/20 1204  Oral     SpO2 11/05/20 1204 99 %     Weight 11/05/20 1205 130 lb (59 kg)     Height 11/05/20 1205 5\' 4"  (1.626 m)     Head Circumference --      Peak Flow --      Pain Score 11/05/20 1204 8     Pain Loc --      Pain Edu? --      Excl. in GC? --     Constitutional: Alert and oriented. Well appearing and in no acute distress. Eyes: Conjunctivae are normal. EOMI. Head: Atraumatic. Nose: No congestion/rhinnorhea. Mouth/Throat: Mucous membranes are moist.   Neck: No stridor. Trachea Midline. FROM Cardiovascular: Normal rate, regular rhythm. Grossly normal heart sounds.  Good peripheral circulation.  Pacemaker noted on the left chest wall.  Tenderness noted on the left chest Respiratory: Normal respiratory effort.  No retractions. Lungs CTAB. Gastrointestinal: Soft and nontender. No distention. No abdominal bruits.  Musculoskeletal: Mild tenderness on the left hip but able to lift the leg up fully.  No lower extremity tenderness nor edema.  No joint effusions. Neurologic:  Normal speech and language. No gross focal neurologic deficits are appreciated.  Skin:  Skin is warm, dry and intact. No rash noted. Psychiatric: Mood and affect are normal. Speech and behavior are normal. GU: Deferred   ____________________________________________   LABS (all labs ordered are listed, but only abnormal results are displayed)  Labs Reviewed  BASIC METABOLIC PANEL - Abnormal; Notable for the following components:      Result Value   Sodium 134 (*)    Glucose, Bld 310 (*)    BUN 34 (*)    Creatinine, Ser 1.13 (*)    Calcium 8.4 (*)    GFR, Estimated 46 (*)    All other components within normal limits  CBC WITH DIFFERENTIAL/PLATELET - Abnormal; Notable for the following  components:   RBC 3.55 (*)    Hemoglobin 10.9 (*)    HCT 33.7 (*)    All other components within normal limits  URINALYSIS, ROUTINE W REFLEX MICROSCOPIC  TROPONIN I (HIGH SENSITIVITY)  TROPONIN I (HIGH SENSITIVITY)   ____________________________________________   ED ECG REPORT I, 11/07/20, the attending physician, personally viewed and interpreted this ECG.  Atrially sensed ventricularly paced at 60 bpm without any ST elevation, T wave version aVL ____________________________________________  RADIOLOGY Concha Se, personally viewed and evaluated these images (plain radiographs) as part of my medical decision making, as well as reviewing the written report by the radiologist.  ED MD interpretation: No new fractures  Official radiology report(s): DG Ribs Unilateral W/Chest Left  Result Date: 11/05/2020 CLINICAL DATA:  Pain after fall. EXAM: LEFT RIBS AND CHEST - 3+ VIEW COMPARISON:  10/15/2020 FINDINGS: Remote healed left posterior sixth, seventh and eighth rib fractures. No acute rib fractures noted. There is no evidence of pneumothorax or pleural effusion. Chronic asymmetric elevation of right hemidiaphragm. Lungs are clear. Heart size and mediastinal contours are within normal limits. IMPRESSION: 1. No acute cardiopulmonary abnormalities. 2. Remote healed left posterior sixth, seventh and eighth rib fractures. Electronically Signed   By: 12/15/2020 M.D.   On: 11/05/2020 12:55   CT Head Wo Contrast  Result Date: 11/05/2020 CLINICAL DATA:  Head trauma, minor (Age >= 65y) EXAM: CT HEAD WITHOUT CONTRAST TECHNIQUE: Contiguous axial images were obtained from the base of the skull through the vertex without intravenous contrast. COMPARISON:  October 10, 2020 FINDINGS: Brain: No evidence  of acute infarction, hemorrhage, hydrocephalus, extra-axial collection or mass lesion/mass effect. Global parenchymal volume loss. Periventricular white matter hypodensities consistent with  sequela of chronic microvascular ischemic disease. Vascular: Vascular calcifications. Skull: Normal. Negative for fracture or focal lesion. Sinuses/Orbits: No acute finding. Other: None. IMPRESSION: No acute intracranial abnormality. Electronically Signed   By: Meda Klinefelter M.D.   On: 11/05/2020 12:52   CT Chest Wo Contrast  Result Date: 11/05/2020 CLINICAL DATA:  Chest trauma, minor. Neck pain. History of pacemaker and chronic kidney disease. EXAM: CT CHEST WITHOUT CONTRAST TECHNIQUE: Multidetector CT imaging of the chest was performed following the standard protocol without IV contrast. COMPARISON:  CT chest 10/11/2020 and rib radiographs done earlier today. FINDINGS: Cardiovascular: Left subclavian pacemaker leads extend into the right atrium and right ventricle. Extensive atherosclerosis of the aorta, great vessels and coronary arteries. Blood pool hypodensity consistent with anemia. The heart size is normal. There is no pericardial effusion. Mediastinum/Nodes: There are no enlarged mediastinal, hilar or axillary lymph nodes.Stable calcified subcarinal lymph nodes. The thyroid gland, trachea and esophagus demonstrate no significant findings. Lungs/Pleura: No pleural effusion or pneumothorax. There is chronic lung disease with cylindrical bronchiectasis in both lower lobes, the lingula and right middle lobe. There is increased volume loss and peribronchial opacity within the right lower lobe compared with the prior study. There is new tree in bud nodularity in the right upper lobe. Chronic scarring in the lingula and left lower lobe appears unchanged. Upper abdomen: Grossly stable mild intrahepatic biliary dilatation status post cholecystectomy, likely physiologic. Musculoskeletal/Chest wall: Old left-sided rib fractures are again noted. No acute fractures are identified. IMPRESSION: 1. No definite acute posttraumatic findings within the chest. No evidence of mediastinal hematoma. 2. Increased right  lower lobe peribronchial opacities with tree-in-bud nodularity in the right upper lobe, probably inflammatory. This pattern is not typical for contusion. Recommend radiographic follow-up. 3. Otherwise stable chronic lung disease with bronchiectasis and peribronchial scarring in both lungs. 4. Extensive coronary and Aortic Atherosclerosis (ICD10-I70.0). Electronically Signed   By: Carey Bullocks M.D.   On: 11/05/2020 16:00   CT Cervical Spine Wo Contrast  Result Date: 11/05/2020 CLINICAL DATA:  Neck pain.  Trauma. EXAM: CT CERVICAL SPINE WITHOUT CONTRAST TECHNIQUE: Multidetector CT imaging of the cervical spine was performed without intravenous contrast. Multiplanar CT image reconstructions were also generated. COMPARISON:  None. FINDINGS: Alignment: Retrolisthesis of C3 on C4. Skull base and vertebrae: Multilevel severe degenerative changes of the spine. No associated severe osseous neural foraminal or central canal stenosis. Associated fusion of the C3 and C4 vertebral bodies. Partial fusion of the C4-C5 and C5-C6 vertebral bodies. No acute fracture. No aggressive appearing focal osseous lesion or focal pathologic process. Soft tissues and spinal canal: No prevertebral fluid or swelling. No visible canal hematoma. Upper chest: Unremarkable. Other: Atherosclerotic plaque of the carotid arteries. IMPRESSION: No acute displaced fracture or traumatic listhesis of the cervical spine. Electronically Signed   By: Tish Frederickson M.D.   On: 11/05/2020 15:53   DG Hip Unilat W or Wo Pelvis 2-3 Views Left  Result Date: 11/05/2020 CLINICAL DATA:  Left hip pain after fall while getting out of the shower this morning. EXAM: DG HIP (WITH OR WITHOUT PELVIS) 2-3V LEFT COMPARISON:  Hip radiograph 03/01/2016 FINDINGS: There is no evidence of hip fracture or dislocation. Mild osteoarthritis of the hip joints and pubic symphysis. Multilevel degenerative disc disease in the visualized lower lumbar levels. Vascular  calcifications in the pelvis and upper legs. IMPRESSION: 1. No acute  osseous abnormality of the pelvis or left hip. 2. Degenerative changes of the hip joints, pubic symphysis, and lower lumbar levels, similar to 03/01/2016. Electronically Signed   By: Sherron Ales M.D.   On: 11/05/2020 12:53    ____________________________________________   PROCEDURES  Procedure(s) performed (including Critical Care):  .1-3 Lead EKG Interpretation Performed by: Concha Se, MD Authorized by: Concha Se, MD     Interpretation: abnormal     ECG rate:  60s   ECG rate assessment: normal     Rhythm: paced     Ectopy: none     Conduction: normal     ____________________________________________   INITIAL IMPRESSION / ASSESSMENT AND PLAN / ED COURSE  Petrina Tara Rud was evaluated in Emergency Department on 11/05/2020 for the symptoms described in the history of present illness. She was evaluated in the context of the global COVID-19 pandemic, which necessitated consideration that the patient might be at risk for infection with the SARS-CoV-2 virus that causes COVID-19. Institutional protocols and algorithms that pertain to the evaluation of patients at risk for COVID-19 are in a state of rapid change based on information released by regulatory bodies including the CDC and federal and state organizations. These policies and algorithms were followed during the patient's care in the ED.    Patient comes in for a fall.  Suspect most likely mechanical according to the daughter but patient is stated that she is not sure and unclear if she had LOC.  We will make sure we interrogate her pacemaker to make sure it still functioning get repeat cardiac marker to evaluate for ACS to keep on the monitor to evaluate for any arrhythmia.  CT head was ordered from triage but patient is on a great historian so we will also order CT cervical as well as CT chest to rule out rib fractures.  She is able to lift up the left leg I  have low suspicion for a fracture of her hip.  Suspect that she most likely just bruised this.  She got good distal pulse in the foot.  Nurse attempted to ambulate the patient but the daughter declined stating that she is already been ambulatory since the fall therefore I have low suspicion for occult hip fracture.  Her CT scan of her cervical spine was negative Her CT chest shows a little bit of nodularity in the right upper lobe probably inflammatory in nature not typical for confusion they recommend outpatient follow-up to which I have discussed with family.  She is stable on her 1 L of oxygen.  Her cardiac markers are negative x2 I have restarted her home medications for her elevated sugar and placed a diabetes consult.   Social work has also been consulted to help facilitate with placement of patient per family request.  PT has also been placed.  Patient will be handed off pending placement    ____________________________________________   FINAL CLINICAL IMPRESSION(S) / ED DIAGNOSES   Final diagnoses:  Fall, initial encounter      MEDICATIONS GIVEN DURING THIS VISIT:  Medications  insulin aspart (novoLOG) injection 8 Units (has no administration in time range)  acetaminophen (TYLENOL) tablet 1,000 mg (1,000 mg Oral Given 11/05/20 1623)     ED Discharge Orders     None        Note:  This document was prepared using Dragon voice recognition software and may include unintentional dictation errors.    Concha Se, MD 11/05/20 [phone]

## 2020-11-05 NOTE — ED Notes (Signed)
Pt is asleep at this time with equal rise and fall of the chest. Purewic is draining properly.

## 2020-11-05 NOTE — ED Triage Notes (Signed)
Pt comes ems from home with left hip pain and left rib pain after mechanical fall. Pt tripped coming out of shower. Denies any dizziness, weakness, SOB, CP. States she tripped on the lip of her shower.

## 2020-11-05 NOTE — ED Notes (Signed)
Pt chronically on 1L [place].

## 2020-11-05 NOTE — ED Notes (Signed)
Pt rounded on and was resting comfortably at this time with monitoring of BP/ HR and oxygen levels.

## 2020-11-05 NOTE — ED Notes (Signed)
[employer] pacemaker interrogated per MD request.

## 2020-11-05 NOTE — ED Notes (Signed)
Pt educated to wait to eat until CBG can be rechecked.   Pt was found to be eating [employer] bread that family had brought.

## 2020-11-05 NOTE — ED Notes (Signed)
Pt at CT

## 2020-11-05 NOTE — ED Notes (Signed)
Pt presents to ED with c/o of having a mechanical fall today while getting out of the shower. Pt states there was no dizziness or factors preceding this fall. Pt states she has pain in the L hip and rib area. Pt denies numbness or tingling. Pt has L pedal pulse and can move extremity on demand.   Daughter states "if there isn't anything broken then can we go now". This RN apologized for the wait.

## 2020-11-06 LAB — CBG MONITORING, ED
Glucose-Capillary: 253 mg/dL — ABNORMAL HIGH (ref 70–99)
Glucose-Capillary: 291 mg/dL — ABNORMAL HIGH (ref 70–99)
Glucose-Capillary: 390 mg/dL — ABNORMAL HIGH (ref 70–99)
Glucose-Capillary: 337 mg/dL — ABNORMAL HIGH (ref 70–99)

## 2020-11-06 NOTE — NC FL2 (Signed)
[place] MEDICAID FL2 LEVEL OF CARE SCREENING TOOL     IDENTIFICATION  Patient Name: Amy Calhoun Birthdate: 03-25-1928 Sex: female Admission Date (Current Location): 11/05/2020  [facility] and [place] Number:  Chiropodist and Address:  [facility], [address], [place], [place] [address]      Provider Number:    Attending Physician Name and Address:  No att. providers found  Relative Name and Phone Number:  Driscilla Moats [phone]    Current Level of Care: Hospital Recommended Level of Care: Skilled Nursing Facility Prior Approval Number:    Date Approved/Denied:   PASRR Number: [id number]  Discharge Plan: SNF    Current Diagnoses: Patient Active Problem List   Diagnosis Date Noted   Acute on chronic diastolic CHF (congestive heart failure) (HCC) 10/15/2020   Malnutrition of moderate degree 10/12/2020   Complete heart block (HCC)    Symptomatic bradycardia 10/10/2020   Syncope 10/10/2020   Redness of skin 06/29/2020   Recurrent falls 06/06/2020   Abnormal gait 06/06/2020   Dizziness 06/06/2020   Right wrist pain 03/18/2020   B12 deficiency 03/11/2020   Type 2 diabetes mellitus with hyperglycemia, with long-term current use of insulin (HCC) 11/16/2019   Type 2 diabetes mellitus with stage 3a chronic kidney disease, with long-term current use of insulin (HCC) 11/16/2019   DM type 2 with diabetic peripheral neuropathy (HCC) 11/16/2019   Depression    Weakness of both lower extremities    Fatigue 02/13/2019   Head injury 02/04/2019   Acute hypoxemic respiratory failure due to COVID-19 (HCC) 01/28/2019   Gastroesophageal reflux disease without esophagitis    Insomnia 01/05/2019   Constipation 06/02/2018   Acute pain of left shoulder 05/23/2018   Diarrhea 05/23/2018   Pneumatosis coli 07/29/2017   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   Memory disturbance 03/27/2017   Recurrent urinary tract infection 10/17/2016   Lumbar  stenosis with neurogenic claudication 08/10/2016   Anemia 07/06/2016   AKI (acute kidney injury) (HCC) 04/05/2016   Shortness of breath 03/31/2016   Osteoarthritis of spine with radiculopathy, lumbar region 03/16/2016   History of colitis 03/02/2016   Primary osteoarthritis of left hip 03/02/2016   CKD (chronic kidney disease) stage 3, GFR 30-59 ml/min (HCC) 02/21/2016   DM (diabetes mellitus), type 2 with renal complications (HCC) 01/19/2016   Hyperlipidemia 01/19/2016   Hypothyroidism 01/19/2016   HTN (hypertension), malignant 01/19/2016   Chronic pain 01/19/2016   Esophageal dysmotility 12/21/2015    Orientation RESPIRATION BLADDER Height & Weight     Self, Situation, Place  Normal External catheter Weight: 130 lb (59 kg) Height:  5\' 4"  (162.6 cm)  BEHAVIORAL SYMPTOMS/MOOD NEUROLOGICAL BOWEL NUTRITION STATUS      Continent Diet  AMBULATORY STATUS COMMUNICATION OF NEEDS Skin   Limited Assist Verbally Normal                       Personal Care Assistance Level of Assistance  Bathing, Feeding, Dressing Bathing Assistance: Limited assistance Feeding assistance: Independent Dressing Assistance: Limited assistance     Functional Limitations Info  Sight, Hearing, Speech Sight Info: Adequate Hearing Info: Impaired Speech Info: Adequate    SPECIAL CARE FACTORS FREQUENCY  PT (By licensed PT), OT (By licensed OT)     PT Frequency: 5x per week OT Frequency: 5x per week            Contractures Contractures Info: Not present    Additional Factors Info  Code Status  Code Status Info: full code             Current Medications (11/06/2020):  This is the current hospital active medication list Current Facility-Administered Medications  Medication Dose Route Frequency Provider Last Rate Last Admin   acetaminophen-codeine (TYLENOL #3) 300-30 MG per tablet 1 tablet  1 tablet Oral Q12H PRN Concha Se, MD       albuterol (PROVENTIL) (2.5 MG/3ML) 0.083% nebulizer  solution 2.5 mg  2.5 mg Inhalation Q6H PRN Concha Se, MD       amitriptyline (ELAVIL) tablet 150 mg  150 mg Oral QHS Concha Se, MD   150 mg at 11/05/20 2209   ascorbic acid (VITAMIN C) tablet 500 mg  500 mg Oral Daily Concha Se, MD   500 mg at 11/06/20 1131   cholecalciferol (VITAMIN D3) tablet 2,000 Units  2,000 Units Oral Daily Concha Se, MD   2,000 Units at 11/06/20 1129   docusate sodium (COLACE) capsule 100 mg  100 mg Oral BID Concha Se, MD   100 mg at 11/06/20 1133   ezetimibe (ZETIA) tablet 10 mg  10 mg Oral Daily Concha Se, MD   10 mg at 11/06/20 1137   ferrous sulfate tablet 325 mg  325 mg Oral Daily Concha Se, MD   325 mg at 11/06/20 1132   furosemide (LASIX) tablet 10 mg  10 mg Oral Daily Concha Se, MD   10 mg at 11/06/20 1137   gabapentin (NEURONTIN) capsule 200 mg  200 mg Oral TID Concha Se, MD   200 mg at 11/06/20 1131   insulin aspart (novoLOG) injection 8 Units  8 Units Subcutaneous TID with meals Concha Se, MD   8 Units at 11/06/20 1147   insulin glargine-yfgn (SEMGLEE) injection 8 Units  8 Units Subcutaneous QHS Concha Se, MD   8 Units at 11/06/20 1138   levothyroxine (SYNTHROID) tablet 150 mcg  150 mcg Oral Daily Concha Se, MD   150 mcg at 11/06/20 0606   lisinopril (ZESTRIL) tablet 2.5 mg  2.5 mg Oral Daily Concha Se, MD   2.5 mg at 11/06/20 1132   metoprolol tartrate (LOPRESSOR) tablet 25 mg  25 mg Oral BID Concha Se, MD   25 mg at 11/06/20 1131   ondansetron (ZOFRAN-ODT) disintegrating tablet 4 mg  4 mg Oral Q8H PRN Concha Se, MD       pantoprazole (PROTONIX) EC tablet 40 mg  40 mg Oral Daily Concha Se, MD   40 mg at 11/06/20 1130   pyridOXINE (VITAMIN B-6) tablet 100 mg  100 mg Oral Daily Concha Se, MD   100 mg at 11/06/20 1135   Current Outpatient Medications  Medication Sig Dispense Refill   Acetaminophen-Codeine 300-30 MG tablet TAKE 1 TABLET BY MOUTH EVERY 12 (TWELVE) HOURS AS NEEDED FOR PAIN (MODERATE  TO SEVERE PAIN). 60 tablet 0   amitriptyline (ELAVIL) 75 MG tablet TAKE 2 TABLETS AT BEDTIME 180 tablet 3   ascorbic acid (VITAMIN C) 500 MG tablet Take 500 mg by mouth daily.     Cholecalciferol (VITAMIN D) 2000 units tablet Take 2,000 Units by mouth daily.      cyanocobalamin (,VITAMIN B-12,) 1000 MCG/ML injection INJECT WEEKLY FOR 4 WEEKS THEN INJECT MONTHLY 3 mL 5   ezetimibe (ZETIA) 10 MG tablet Take 1 tablet (10 mg total) by mouth daily. 90 tablet 3   furosemide (  LASIX) 20 MG tablet Take 0.5 tablets (10 mg total) by mouth daily. 30 tablet 1   gabapentin (NEURONTIN) 100 MG capsule Take 2 capsules (200 mg total) by mouth 3 (three) times daily. 540 capsule 3   Insulin Glargine (LANTUS SOLOSTAR) 100 UNIT/ML Solostar Pen Inject 8 Units into the skin at bedtime. 15 mL 11   insulin lispro (HUMALOG KWIKPEN) 100 UNIT/ML KwikPen Inject 8 Units into the skin 3 (three) times daily. Max Daily 30 units 30 mL 3   Iron, Ferrous Sulfate, 325 (65 Fe) MG TABS Take 325 mg by mouth every other day. (Patient taking differently: Take 325 mg by mouth every other day. Takes daily) 90 tablet 2   levothyroxine (SYNTHROID) 150 MCG tablet TAKE 1 TABLET DAILY 90 tablet 3   lisinopril (ZESTRIL) 2.5 MG tablet Take 1 tablet (2.5 mg total) by mouth daily. Take for blood pressure greater than 140/90 90 tablet 1   metoprolol tartrate (LOPRESSOR) 25 MG tablet Take 1 tablet (25 mg total) by mouth 2 (two) times daily. 120 tablet 1   omeprazole (PRILOSEC) 40 MG capsule TAKE 1 CAPSULE TWICE A DAY 180 capsule 3   pyridOXINE (VITAMIN B-6) 100 MG tablet Take 100 mg by mouth daily.     albuterol (PROVENTIL HFA;VENTOLIN HFA) 108 (90 Base) MCG/ACT inhaler Inhale 2 puffs into the lungs every 6 (six) hours as needed for wheezing or shortness of breath. 1 Inhaler 2   Continuous Blood Gluc Sensor (FREESTYLE LIBRE 14 DAY SENSOR) MISC 1 Package by Does not apply route every 14 (fourteen) days. 6 each 3   docusate sodium (COLACE) 100 MG  capsule Take 1 capsule (100 mg total) by mouth 2 (two) times daily. 10 capsule 0   glucagon (GLUCAGON EMERGENCY) 1 MG injection Inject 1 mg into the vein once as needed. For hypoglycemic episodes. E11.42 1 each 3   Insulin Pen Needle 32G X 4 MM MISC 1 Device by Does not apply route in the morning, at noon, in the evening, and at bedtime. 150 each 11   Misc. Devices (BARIATRIC ROLLATOR) MISC Use as needed 1 each 0   ondansetron (ZOFRAN ODT) 4 MG disintegrating tablet Take 1 tablet (4 mg total) by mouth every 8 (eight) hours as needed for nausea or vomiting. 30 tablet 0     Discharge Medications: Please see discharge summary for a list of discharge medications.  Relevant Imaging Results:  Relevant Lab Results:   Additional Information soc sec # [id number]  Verna Czech [place], [place]

## 2020-11-06 NOTE — ED Notes (Signed)
Pt on call light requesting to have BM - This RN and ERT Melody at bedside to assist. Pt independently ambulates to toilet in bathroom, able to have BM and void sans complications. Linens replaced, new purewick placed for comfort. Pt given warm blankets. Denies other needs at this time.    Awaiting medications from pharmacy.

## 2020-11-06 NOTE — TOC Progression Note (Signed)
Transition of Care [facility]) - Progression Note    Patient Details  Name: Amy Calhoun MRN: [id number] Date of Birth: October 31, 1928  Transition of Care [facility]) CM/SW Contact  Kenaz Olafson, [place], [place] Phone Number: 11/06/2020, 1:32 PM  Clinical Narrative:     Bed search started, patient's mother prefers [facility] as first choice, however is open to expanding bed search.  Transition of Care to continue to follow  [facility], LCSW Transition of Care [phone]     Barriers to Discharge: Continued Medical Work up  Expected Discharge Plan and Services   In-house Referral: Clinical Social Work   Post Acute Care Choice: Skilled Nursing Facility (PT/OT evaluation pending)                   DME Arranged:  (oxygen 1 liter-Adapt)                     Social Determinants of Health (SDOH) Interventions    Readmission Risk Interventions No flowsheet data found.

## 2020-11-06 NOTE — Evaluation (Addendum)
Physical Therapy Evaluation Patient Details Name: Amy Calhoun MRN: [id number] DOB: Feb 26, 1928 Today's Date: 11/06/2020  History of Present Illness  Pt is a 87 y/o F who presented to ED following a fall onto her L hip & rib. All imaging from 10/22 was negative. Of note, pt was recently admitted to the hospital & d/c back home on 10/3 with daughter noting pt has fallen 3 times since then. PMH: complete heart block s/p pacemaker (placed on 10/12/20), CKD stage 3, depression, GERD, HLD, HTN, IDDM, ischemic colitis, neuropathy, OA   Clinical Impression  Pt seen for PT evaluation with pt only AxO to self & superficial location of "hospital". Pt with decrease awareness/ability to maintain NWB LUE 2/2 pacemaker precautions. Pt requires min/mod assist for bed mobility but is able to ambulate 30-40 ft with RW & CGA. Upon PT arrival pt found with nasal cannula around chin & SpO2 98% on room air so pt left on room air during session. Pt's fingers noted to be extremely cold & during ambulation with poor pleth waveform but during first bout of ambulation SpO2 dropped to 73% with pt noting "only a little" SOB, dropping to 83% during 2nd gait trial but pt then monitor reading >90% so unsure of accuracy of SPO2 during gait as pt did not appear in distress. Pt left on 1L/min at end of session with SpO2 93-100% -- nurse notified of O2 readings. Upon d/c from acute setting pt would benefit from STR d/c to maximize independence with functional mobility & reduce fall risk prior to return home.        Recommendations for follow up therapy are one component of a multi-disciplinary discharge planning process, led by the attending physician.  Recommendations may be updated based on patient status, additional functional criteria and insurance authorization.  Follow Up Recommendations SNF;Supervision/Assistance - 24 hour    Equipment Recommendations   (TBD in next venue)    Recommendations for Other Services        Precautions / Restrictions Precautions Precautions: Fall Restrictions Weight Bearing Restrictions: Yes LUE Weight Bearing: Non weight bearing (2/2 pacemaker placed on 10/12/20) Other Position/Activity Restrictions: can use walker per pacer precautions      Mobility  Bed Mobility Overal bed mobility: Needs Assistance Bed Mobility: Supine to Sit;Sit to Supine     Supine to sit: Min assist (HOB flat) Sit to supine: Mod assist (HOB flat, to elevate BLE onto bed)        Transfers Overall transfer level: Needs assistance Equipment used: Rolling walker (2 wheeled) Transfers: Sit to/from Stand Sit to Stand: Min guard         General transfer comment: PT attempts to cue pt for hand placement  Ambulation/Gait Ambulation/Gait assistance: Min guard Gait Distance (Feet):  (30 ft + 40 ft) Assistive device: Rolling walker (2 wheeled)          Stairs            Wheelchair Mobility    Modified Rankin (Stroke Patients Only)       Balance Overall balance assessment: Needs assistance Sitting-balance support: Bilateral upper extremity supported;Feet unsupported Sitting balance-Leahy Scale: Fair       Standing balance-Leahy Scale: Poor Standing balance comment: requires UE support during standing/gait                             Pertinent Vitals/Pain Pain Assessment: No/denies pain    Home Living Family/patient expects to be  discharged to::  (chart says both ALF & ILF, unsure which & pt unable to elaborate & no family present to confirm)               Home Equipment: Walker - 4 wheels;Cane - quad (per chart)      Prior Function     Gait / Transfers Assistance Needed: Per chart, pt has fallen 3-4 times in the past couple of weeks. Per chart, recommended pt to ambulate with RW.           Hand Dominance        Extremity/Trunk Assessment   Upper Extremity Assessment Upper Extremity Assessment: Overall WFL for tasks assessed     Lower Extremity Assessment Lower Extremity Assessment: Generalized weakness       Communication   Communication: HOH  Cognition Arousal/Alertness: Awake/alert Behavior During Therapy: Flat affect Overall Cognitive Status: No family/caregiver present to determine baseline cognitive functioning                                 General Comments: HOH & AxO to self & "hospital" otherwise not oriented, able to follow simple commands with extra time, pleasant      General Comments      Exercises     Assessment/Plan    PT Assessment Patient needs continued PT services  PT Problem List Decreased strength;Decreased range of motion;Decreased activity tolerance;Decreased balance;Decreased mobility;Decreased cognition;Decreased knowledge of use of DME;Decreased safety awareness;Cardiopulmonary status limiting activity;Decreased knowledge of precautions       PT Treatment Interventions DME instruction;Gait training;Functional mobility training;Stair training;Therapeutic activities;Therapeutic exercise;Balance training;Neuromuscular re-education;Patient/family education;Cognitive remediation    PT Goals (Current goals can be found in the Care Plan section)  Acute Rehab PT Goals PT Goal Formulation: Patient unable to participate in goal setting Time For Goal Achievement: 11/20/20 Potential to Achieve Goals: Fair    Frequency Min 2X/week   Barriers to discharge Decreased caregiver support      Co-evaluation               AM-PAC PT "6 Clicks" Mobility  Outcome Measure Help needed turning from your back to your side while in a flat bed without using bedrails?: A Little Help needed moving from lying on your back to sitting on the side of a flat bed without using bedrails?: A Little Help needed moving to and from a bed to a chair (including a wheelchair)?: A Little Help needed standing up from a chair using your arms (e.g., wheelchair or bedside chair)?: A  Little Help needed to walk in hospital room?: A Little Help needed climbing 3-5 steps with a railing? : A Lot 6 Click Score: 17    End of Session   Activity Tolerance: Patient tolerated treatment well Patient left: in bed;with call bell/phone within reach Nurse Communication: Mobility status (O2) PT Visit Diagnosis: Muscle weakness (generalized) (M62.81);Difficulty in walking, not elsewhere classified (R26.2);Unsteadiness on feet (R26.81);Pain    Time: 5732-2025 PT Time Calculation (min) (ACUTE ONLY): 15 min   Charges:   PT Evaluation $PT Eval Low Complexity: 1 Low          Aleda Grana, PT, DPT 11/06/20, 11:55 AM   Sandi Mariscal 11/06/2020, 11:51 AM

## 2020-11-06 NOTE — ED Notes (Signed)
Pharmacy messaged for early meds as per daughter, patient is beginning to sundown.

## 2020-11-06 NOTE — ED Notes (Signed)
Pt resting comfortably listening to care network, call light in reach. Denies any needs at this time.

## 2020-11-06 NOTE — Progress Notes (Signed)
Inpatient Diabetes Program Recommendations  AACE/ADA: New Consensus Statement on Inpatient Glycemic Control (2015)  Target Ranges:  Prepandial:   less than 140 mg/dL      Peak postprandial:   less than 180 mg/dL (1-2 hours)      Critically ill patients:  140 - 180 mg/dL   Lab Results  Component Value Date   GLUCAP 253 (H) 11/06/2020   HGBA1C 9.4 (A) 09/21/2020    Review of Glycemic Control  Diabetes history: type 2 Outpatient Diabetes medications: Lantus 8 units daily, Humalog 8 units TID with meals Current orders for Inpatient glycemic control: Semglee 8 units at HS, Novolog 8 units TID with meals  Inpatient Diabetes Program Recommendations:   Received diabetes coordinator consult. Noted that patient's blood sugar was 319 mg/dl on admission to ED. Patient is currently on home insulin dosages.  Recommend adding Novolog SENSITIVE correction scale TID if patient is admitted to the hospital. Also considering what treatments patient will have and how patient will be eating, may consider decreasing Novolog meal coverage to 4 units TID with meals, given if eating 50% of meal and correction scale is ordered.  Will continue to monitor blood sugars while in the hospital.  Smith Mince RN BSN CDE Diabetes Coordinator Pager: [phone]  8am-5pm

## 2020-11-07 LAB — BASIC METABOLIC PANEL
Anion gap: 10 (ref 5–15)
BUN: 36 mg/dL — ABNORMAL HIGH (ref 8–23)
CO2: 28 mmol/L (ref 22–32)
Calcium: 8.7 mg/dL — ABNORMAL LOW (ref 8.9–10.3)
Chloride: 97 mmol/L — ABNORMAL LOW (ref 98–111)
Creatinine, Ser: 1.12 mg/dL — ABNORMAL HIGH (ref 0.44–1.00)
GFR, Estimated: 47 mL/min — ABNORMAL LOW (ref >60)
Glucose, Bld: 240 mg/dL — ABNORMAL HIGH (ref 70–99)
Potassium: 4.6 mmol/L (ref 3.5–5.1)
Sodium: 135 mmol/L (ref 135–145)

## 2020-11-07 LAB — CBC WITH DIFFERENTIAL/PLATELET
Abs Immature Granulocytes: 0.04 10*3/uL (ref 0.00–0.07)
Basophils Absolute: 0.1 10*3/uL (ref 0.0–0.1)
Basophils Relative: 1 %
Eosinophils Absolute: 0.2 10*3/uL (ref 0.0–0.5)
Eosinophils Relative: 2 %
HCT: 36.2 % (ref 36.0–46.0)
Hemoglobin: 11.9 g/dL — ABNORMAL LOW (ref 12.0–15.0)
Immature Granulocytes: 0 %
Lymphocytes Relative: 28 %
Lymphs Abs: 3 10*3/uL (ref 0.7–4.0)
MCH: 30.9 pg (ref 26.0–34.0)
MCHC: 32.9 g/dL (ref 30.0–36.0)
MCV: 94 fL (ref 80.0–100.0)
Monocytes Absolute: 1 10*3/uL (ref 0.1–1.0)
Monocytes Relative: 9 %
Neutro Abs: 6.6 10*3/uL (ref 1.7–7.7)
Neutrophils Relative %: 60 %
Platelets: 218 10*3/uL (ref 150–400)
RBC: 3.85 MIL/uL — ABNORMAL LOW (ref 3.87–5.11)
RDW: 13.2 % (ref 11.5–15.5)
WBC: 10.9 10*3/uL — ABNORMAL HIGH (ref 4.0–10.5)
nRBC: 0 % (ref 0.0–0.2)

## 2020-11-07 LAB — CBG MONITORING, ED
Glucose-Capillary: 286 mg/dL — ABNORMAL HIGH (ref 70–99)
Glucose-Capillary: 288 mg/dL — ABNORMAL HIGH (ref 70–99)
Glucose-Capillary: 168 mg/dL — ABNORMAL HIGH (ref 70–99)
Glucose-Capillary: 234 mg/dL — ABNORMAL HIGH (ref 70–99)
Glucose-Capillary: 181 mg/dL — ABNORMAL HIGH (ref 70–99)

## 2020-11-07 MED ORDER — INSULIN ASPART 100 UNIT/ML IJ SOLN
0.0000 [IU] | Freq: Three times a day (TID) | INTRAMUSCULAR | Status: DC
  Administered 2020-11-07: 3 [IU] via SUBCUTANEOUS
  Administered 2020-11-07: 5 [IU] via SUBCUTANEOUS
  Administered 2020-11-08: 3 [IU] via SUBCUTANEOUS
  Administered 2020-11-08: 5 [IU] via SUBCUTANEOUS
  Filled 2020-11-07 (×4): qty 1

## 2020-11-07 MED ORDER — INSULIN GLARGINE-YFGN 100 UNIT/ML ~~LOC~~ SOLN
12.0000 [IU] | Freq: Every day | SUBCUTANEOUS | Status: DC
  Administered 2020-11-07: 12 [IU] via SUBCUTANEOUS
  Filled 2020-11-07 (×2): qty 0.12

## 2020-11-07 MED ORDER — INSULIN ASPART 100 UNIT/ML IJ SOLN
0.0000 [IU] | Freq: Every day | INTRAMUSCULAR | Status: DC
Start: 2020-11-07 — End: 2020-11-08

## 2020-11-07 NOTE — ED Notes (Signed)
Hold Lisinopril, Metropolol, Furosemide per EDP d/t BP and Pulse trending vitals.

## 2020-11-07 NOTE — ED Notes (Signed)
Pt upper denture and left hearing aid only visible upon shift assessment.

## 2020-11-07 NOTE — ED Notes (Signed)
This RN & 2nd RN Alycia performed daily care. Pt given modified bed bath, brief & purewick, suction tubing & cannister changed. Given new scrub bottoms, warm blankets. Fresh ice water supplied.   Denies other needs at this time.

## 2020-11-07 NOTE — ED Notes (Signed)
Per EDP, continue to hold furosemide, lisinopril, metropolol.  Will order labs to reassess.

## 2020-11-07 NOTE — ED Notes (Signed)
Pt eating from breakfast tray at this time.   

## 2020-11-07 NOTE — Progress Notes (Addendum)
Inpatient Diabetes Program Recommendations  AACE/ADA: New Consensus Statement on Inpatient Glycemic Control   Target Ranges:  Prepandial:   less than 140 mg/dL      Peak postprandial:   less than 180 mg/dL (1-2 hours)      Critically ill patients:  140 - 180 mg/dL   Results for Amy Calhoun, Amy Calhoun (MRN [id number]) as of 11/07/2020 07:48  Ref. Range 11/06/2020 07:40 11/06/2020 11:24 11/06/2020 16:56 11/06/2020 21:11  Glucose-Capillary Latest Ref Range: 70 - 99 mg/dL 245 (H) 809 (H) 983 (H) 337 (H)    Review of Glycemic Control  Diabetes history: DM2 Outpatient Diabetes medications: Lantus 8 units QHS, Humalog 8 units TID with meals plus additional units for correction Current orders for Inpatient glycemic control: Semglee 8 units QHS, Novolog 8 units TID with meals  Inpatient Diabetes Program Recommendations:    Insulin: Please consider increasing Semglee to 12 units QHS and ordering Novolog 0-9 units AC&HS.  NOTE: Per chart, patient sees Dr. Lonzo Cloud (Endocrinologist) for DM management and was last seen 09/21/20. Per office visit note on 09/21/20, patient was to continue Lantus 8 units QHS, Humalog increased from 6 to 8 units TID with meals, and Novolog for correction (130/60 - target glucose 130 mg/dl and 1 unit for every 60 mg/dl above target glucose).  Thanks, Orlando Penner, RN, MSN, CDE Diabetes Coordinator Inpatient Diabetes Program [phone] (Team Pager from 8am to 5pm)

## 2020-11-07 NOTE — ED Notes (Signed)
Pt in bed in semifowlers position, easily awakened to provide medication per [facility]. Denies other needs at this time. Call light in reach. Purewick functioning properly.

## 2020-11-07 NOTE — ED Notes (Signed)
Pt eating from dinner tray at this time. 

## 2020-11-07 NOTE — TOC Progression Note (Signed)
Transition of Care [facility]) - Progression Note    Patient Details  Name: Amy Calhoun MRN: [id number] Date of Birth: 1928-03-11  Transition [facility]) CM/SW Contact  Marina Goodell Phone Number: [phone] 11/07/2020, 12:18 PM  Clinical Narrative:     CSW spoke with Gavin Pound at [facility], she stated she is waiting for insurance authorization for placement confirmation.  Gavin Pound stated as soon as she has the authorization she will update this CSW.     Barriers to Discharge: Continued Medical Work up  Expected Discharge Plan and Services   In-house Referral: Clinical Social Work   Post Acute Care Choice: Skilled Nursing Facility (PT/OT evaluation pending)                   DME Arranged:  (oxygen 1 liter-Adapt)                     Social Determinants of Health (SDOH) Interventions    Readmission Risk Interventions No flowsheet data found.

## 2020-11-07 NOTE — ED Notes (Signed)
Pt switched to hospital bed at this time.  

## 2020-11-08 LAB — URINALYSIS, COMPLETE (UACMP) WITH MICROSCOPIC
Bilirubin Urine: NEGATIVE
Glucose, UA: NEGATIVE mg/dL
Hgb urine dipstick: NEGATIVE
Ketones, ur: NEGATIVE mg/dL
Nitrite: POSITIVE — AB
Protein, ur: NEGATIVE mg/dL
Specific Gravity, Urine: 1.009 (ref 1.005–1.030)
pH: 5 (ref 5.0–8.0)

## 2020-11-08 LAB — CBG MONITORING, ED
Glucose-Capillary: 199 mg/dL — ABNORMAL HIGH (ref 70–99)
Glucose-Capillary: 215 mg/dL — ABNORMAL HIGH (ref 70–99)

## 2020-11-08 LAB — RESP PANEL BY RT-PCR (FLU A&B, COVID) ARPGX2
Influenza A by PCR: NEGATIVE
Influenza B by PCR: NEGATIVE
SARS Coronavirus 2 by RT PCR: NEGATIVE

## 2020-11-08 MED ORDER — CEFTRIAXONE SODIUM 1 G IJ SOLR
1.0000 g | Freq: Once | INTRAMUSCULAR | Status: AC
  Administered 2020-11-08: 1 g via INTRAMUSCULAR
  Filled 2020-11-08: qty 10

## 2020-11-08 MED ORDER — CEPHALEXIN 500 MG PO CAPS: 500.0000 mg | ORAL_CAPSULE | Freq: Three times a day (TID) | ORAL | 0 refills | Status: DC

## 2020-11-08 MED ORDER — LIDOCAINE HCL (PF) 1 % IJ SOLN
INTRAMUSCULAR | Status: AC
  Administered 2020-11-08: 1.2 mL
  Filled 2020-11-08: qty 5

## 2020-11-08 MED ORDER — CEPHALEXIN 500 MG PO CAPS: 500.0000 mg | ORAL_CAPSULE | Freq: Two times a day (BID) | ORAL | 0 refills | Status: AC

## 2020-11-08 NOTE — Discharge Instructions (Signed)
Take the Keflex as prescribed for UTI

## 2020-11-08 NOTE — TOC Transition Note (Signed)
Transition of Care [facility]) - CM/SW Discharge Note   Patient Details  Name: Amy Calhoun MRN: [id number] Date of Birth: 10/03/1928  Transition of Care [facility]) CM/SW Contact:  Joseph Art, LCSWA Phone Number: 11/08/2020, 10:25 AM   Clinical Narrative:     Patient has insurance auth approval for SNF placement at [facility], [place] [id number].  CSW requested COVID test before discharge.  Room number 203B, B wing, report# [phone].  Please fax AVS to [phone]. EDP ED Staff updated.  Gavin Pound w/ [facility] will update family.    Barriers to Discharge: Continued Medical Work up   Patient Goals and CMS Choice Patient states their goals for this hospitalization and ongoing recovery are:: "I guess I am ok with going to rehab" CMS Medicare.gov Compare Post Acute Care list provided to:: Patient    Discharge Placement                       Discharge Plan and Services In-house Referral: Clinical Social Work   Post Acute Care Choice: Skilled Nursing Facility (PT/OT evaluation pending)          DME Arranged:  (oxygen 1 liter-Adapt)                    Social Determinants of Health (SDOH) Interventions     Readmission Risk Interventions No flowsheet data found.

## 2020-11-08 NOTE — ED Notes (Signed)
Patient set up and eating breakfast tray at this time

## 2020-11-08 NOTE — ED Notes (Signed)
Patient purwick checked and suction canister emptied. Patient dry at this time. Patient reports she is comfortable and does not need anything at the moment

## 2020-11-08 NOTE — ED Provider Notes (Signed)
Patient has been accepted to [facility].  She has been hemodynamically stable.  Nursing did note a foul-smelling urine this morning so UA was sent which does show possible UTI.  She was given a shot of Rocephin.  She is hemodynamically stable with no evidence to suggest sepsis or systemic illness.  Feel this is very reasonable to treat as an outpatient with Keflex and should not delay her SNF placement.     Shaune Pollack, MD 11/08/20 [phone]

## 2020-11-08 NOTE — TOC Transition Note (Signed)
Transition of Care [facility] - CM/SW Discharge Note   Patient Details  Name: Amy Calhoun MRN: [id number] Date of Birth: September 09, 1928  Transition of Care [facility]) CM/SW Contact:  Worth Cellar, RN Phone Number: 11/08/2020, 3:19 PM   Clinical Narrative:    Notified by ED RN patient has been cleared for discharge.   Updated Debra @ [facility] change of plans and patient will transfer to SNF today.      Barriers to Discharge: Continued Medical Work up   Patient Goals and CMS Choice Patient states their goals for this hospitalization and ongoing recovery are:: "I guess I am ok with going to rehab" CMS Medicare.gov Compare Post Acute Care list provided to:: Patient    Discharge Placement                       Discharge Plan and Services In-house Referral: Clinical Social Work   Post Acute Care Choice: Skilled Nursing Facility (PT/OT evaluation pending)          DME Arranged:  (oxygen 1 liter-Adapt)                    Social Determinants of Health (SDOH) Interventions     Readmission Risk Interventions No flowsheet data found.

## 2020-11-08 NOTE — ED Notes (Addendum)
Pt incontinent of urine, purwick malfunction. Full bed change performed, appropriate peri care, new brief - pt opts for no scrub pants at this time. Repositioned in bed.   Pt noted to require full staff assist to ambulate this AM. Decreased conditioning observed. Substantial change in strength from yesterdays shift.    MD notified of malodorous urine, increased weakness.

## 2020-11-08 NOTE — ED Notes (Signed)
Called [place] and gave report to [employer]

## 2020-11-08 NOTE — TOC Progression Note (Signed)
Transition of Care [facility]) - Progression Note    Patient Details  Name: Sirenity Shew MRN: [id number] Date of Birth: 03-Dec-1928  Transition of Care [facility]) CM/SW Contact  Joseph Art, [place] Phone Number: 11/08/2020, 8:12 AM  Clinical Narrative:     Pending [employer] insurance auth for [facility] SNF placement.  Verdin,Emma(POA) (Daughter) [phone] (Mobile), main contact.    Barriers to Discharge: Continued Medical Work up  Expected Discharge Plan and Services   In-house Referral: Clinical Social Work   Post Acute Care Choice: Skilled Nursing Facility (PT/OT evaluation pending)                   DME Arranged:  (oxygen 1 liter-Adapt)                     Social Determinants of Health (SDOH) Interventions    Readmission Risk Interventions No flowsheet data found.

## 2020-11-08 NOTE — TOC Progression Note (Signed)
Transition of Care [facility]) - Progression Note    Patient Details  Name: Amy Calhoun MRN: [id number] Date of Birth: 11-Jun-1928  Transition of Care [facility]) CM/SW Contact  Marina Goodell Phone Number: [phone] 11/08/2020, 7:48 AM  Clinical Narrative:     CSW spoke with patient's daughter Bing Quarry) (Daughter)  [phone] (Mobile) and her other daughter.  Ms. Layne Benton asked about the timeline for the insurance authorization.  CSW stated [employer] can take between 24-72 hours to run an Firefighter. Ms. Layne Benton stated she spoke with the insurance company and they had only received the insurance paperwork at 12:30PM.  CSW explained the facility was doing the insurance auth and [facility] sent the information to the facility over the weekend. CSW stated the facility would contact me as soon as they had a reply from the insurance company. Ms. Layne Benton expressed her displeasure about the patient being the hallway and getting a draft from the opening and closing of the doors.  I encouraged Ms. Verdin to speak with the Charge RN, and explained I do not make decisions about placement nor do I have influence on changing where they are.  CSW spoke with Consulting civil engineer, who stated she didn't have room anywhere else.  CSW encouraged Charge RN to speak directly to patient's family and inform them as well.    Barriers to Discharge: Continued Medical Work up  Expected Discharge Plan and Services   In-house Referral: Clinical Social Work   Post Acute Care Choice: Skilled Nursing Facility (PT/OT evaluation pending)                   DME Arranged:  (oxygen 1 liter-Adapt)                     Social Determinants of Health (SDOH) Interventions    Readmission Risk Interventions No flowsheet data found.

## 2020-11-08 NOTE — TOC Progression Note (Addendum)
Transition of Care [facility]) - Progression Note    Patient Details  Name: Amy Calhoun MRN: [id number] Date of Birth: November 21, 1928  Transition of Care [facility]) CM/SW Contact  Stonewall Cellar, RN Phone Number: 11/08/2020, 3:12 PM  Clinical Narrative:    SNF discharge delayed due to EDP requesting urine culture and results. Will reassess tomorrow morning and assist as needed.   Updated Debra @ [facility]      Barriers to Discharge: Continued Medical Work up  Expected Discharge Plan and Services   In-house Referral: Clinical Social Work   Post Acute Care Choice: Skilled Nursing Facility (PT/OT evaluation pending)                   DME Arranged:  (oxygen 1 liter-Adapt)                     Social Determinants of Health (SDOH) Interventions    Readmission Risk Interventions No flowsheet data found.

## 2020-11-09 ENCOUNTER — Telehealth: Payer: Self-pay | Admitting: Family

## 2020-11-09 ENCOUNTER — Telehealth: Payer: Self-pay | Admitting: Emergency Medicine

## 2020-11-09 NOTE — Telephone Encounter (Signed)
Patients daughter called in and she would like for Amy Calhoun to know that her mother was placed in rehab yesterday at [facility].Please call the patients daughter if you have any questions.

## 2020-11-09 NOTE — Telephone Encounter (Signed)
Family member Starla Link) of patient called to make [place] aware that the patient who was transported from [facility] ED via EMS on 11/08/20 to [facility].  Bed Bug(s) were noted on her today from [facility] staff.   [facility] ED has no current active known cases of Bed bugs in the ED.  I assured the family member that I would have our EVS personal look into assessing the areas this patient was in while in the ED.  EVS was informed and will make arrangements to have the 2 areas evaluated for bed bugs (room 1 and 1 hallway)

## 2020-11-10 ENCOUNTER — Telehealth: Payer: Self-pay | Admitting: Internal Medicine

## 2020-11-10 LAB — URINE CULTURE: Culture: >=100000 — AB

## 2020-11-10 NOTE — Telephone Encounter (Signed)
Pt is in [facility], [place], [place]. Nursing staff need sliding scale faxed [phone]

## 2020-11-11 NOTE — Telephone Encounter (Signed)
Sliding scale information faxed to number below

## 2020-11-14 ENCOUNTER — Telehealth: Payer: Self-pay | Admitting: Family

## 2020-11-14 NOTE — Telephone Encounter (Signed)
[facility] staff called to let us know that patient Amy Calhoun has been moved to a nursing skilled facility by her daughter and is no longer apart of their facility.

## 2020-11-17 ENCOUNTER — Other Ambulatory Visit: Payer: Self-pay

## 2020-11-17 ENCOUNTER — Ambulatory Visit (INDEPENDENT_AMBULATORY_CARE_PROVIDER_SITE_OTHER): Payer: Medicare HMO | Admitting: Internal Medicine

## 2020-11-17 ENCOUNTER — Encounter: Payer: Self-pay | Admitting: Internal Medicine

## 2020-11-17 VITALS — BP 140/60 | HR 60 | Ht 62.0 in | Wt 132.0 lb

## 2020-11-17 DIAGNOSIS — J9601 Acute respiratory failure with hypoxia: Secondary | ICD-10-CM

## 2020-11-17 DIAGNOSIS — E785 Hyperlipidemia, unspecified: Secondary | ICD-10-CM

## 2020-11-17 DIAGNOSIS — N179 Acute kidney failure, unspecified: Secondary | ICD-10-CM

## 2020-11-17 DIAGNOSIS — I2510 Atherosclerotic heart disease of native coronary artery without angina pectoris: Secondary | ICD-10-CM

## 2020-11-17 DIAGNOSIS — I10 Essential (primary) hypertension: Secondary | ICD-10-CM

## 2020-11-17 DIAGNOSIS — E1169 Type 2 diabetes mellitus with other specified complication: Secondary | ICD-10-CM

## 2020-11-17 DIAGNOSIS — I442 Atrioventricular block, complete: Secondary | ICD-10-CM

## 2020-11-17 NOTE — Patient Instructions (Signed)
Medication Instructions:   Your physician recommends that you continue on your current medications as directed. Please refer to the Current Medication list given to you today.  *If you need a refill on your cardiac medications before your next appointment, please call your pharmacy*   Lab Work:  Today: CMET, Lipid panel   If you have labs (blood work) drawn today and your tests are completely normal, you will receive your results only by: MyChart Message (if you have MyChart) OR A paper copy in the mail If you have any lab test that is abnormal or we need to change your treatment, we will call you to review the results.   Testing/Procedures:  None ordered   Follow-Up: At [facility], you and your health needs are our priority.  As part of our continuing mission to provide you with exceptional heart care, we have created designated Provider Care Teams.  These Care Teams include your primary Cardiologist (physician) and Advanced Practice Providers (APPs -  Physician Assistants and Nurse Practitioners) who all work together to provide you with the care you need, when you need it.  We recommend signing up for the patient portal called "MyChart".  Sign up information is provided on this After Visit Summary.  MyChart is used to connect with patients for Virtual Visits (Telemedicine).  Patients are able to view lab/test results, encounter notes, upcoming appointments, etc.  Non-urgent messages can be sent to your provider as well.   To learn more about what you can do with MyChart, go to [email or web address].    Your next appointment:    1 month(s) w/ APP  The format for your next appointment:   In Person  Provider:   You may see one of the following Advanced Practice Providers on your designated Care Team:   Nicolasa Ducking, NP Eula Listen, PA-C Marisue Ivan, PA-C [place], [place]

## 2020-11-17 NOTE — Progress Notes (Signed)
Follow-up Outpatient Visit Date: 11/17/2020  Primary Care Provider: Burnard Hawthorne, FNP [address] [place] [address]  Chief Complaint: Follow-up complete heart block and coronary artery disease  HPI:  Ms. Stiger is a 87 y.o. female with history of hypertension, hyperlipidemia, diabetes mellitus complicated by peripheral neuropathy, orthostatic hypotension, hypothyroidism, and GERD, who presents for follow-up of syncope with episodes of high-grade AV block.  She underwent cardiac catheterization and temporary transvenous pacemaker placement.  Catheterization showed severe single-vessel CAD involving a relatively small right coronary artery.  Permanent pacemaker was placed the following day by Dr. Quentin Ore.  Hospital course was complicated by acute kidney injury treated with aggressive hydration leading to iatrogenic volume overload and heart failure in the setting of preserved LVEF.  She was initially discharged home with supplemental oxygen but readmitted to the ED due to multiple falls.  She is now in rehab and has been told that she will be unable to return to her previous residence at an independent living facility.  From a heart standpoint, Ms. Weckwerth feels well.  She denies chest pain, shortness of breath, palpitations, and lightheadedness.  She has not had any further syncopal episodes.  Her pacemaker incision has healed well.  She noticed some edema in the left calf, which has improved.  Her daughter inquires about whether or not Ms. Marasigan needs to remain on a diuretic as well as if she needs supplemental oxygen and wear she will be able to live after being discharged from rehab.  --------------------------------------------------------------------------------------------------  Past Medical History:  Diagnosis Date   Allergy    Ambulates with cane    Anemia    At high risk for falls    Cataract    removed both eyes   Chronic kidney disease 08/07/2016   Chronic  renal failure, stage III   Depression    Esophageal dysphagia    Gastric ulcer    GERD (gastroesophageal reflux disease)    Hyperlipidemia    Hypertension    Hypothyroidism    IDDM (insulin dependent diabetes mellitus)    Ischemic colitis ([place])    Neuromuscular disorder ([place])    neuropathy   Osteoarthritis    Ulcer of esophagus    Past Surgical History:  Procedure Laterality Date   ABDOMINAL HYSTERECTOMY     CATARACT EXTRACTION, BILATERAL     CHOLECYSTECTOMY     COLONOSCOPY  2010   ESOPHAGEAL DILATION  08/07/2016   usually a couple times a year, last time 07/30/16   GALLBLADDER SURGERY     LEFT HEART CATH AND CORONARY ANGIOGRAPHY N/A 10/11/2020   Procedure: LEFT HEART CATH AND CORONARY ANGIOGRAPHY;  Surgeon: Nelva Bush, MD;  Location: [place] [facility];  Service: Cardiovascular;  Laterality: N/A;   LUMBAR LAMINECTOMY/DECOMPRESSION MICRODISCECTOMY N/A 08/10/2016   Procedure: BILATERAL HEMILAMINECTOMY LUMBAR THREE-FOUR,LUMBAR FOUR-FIVE,LEFT LUMBAR FIVE-SACRAL ONE HEMILAMINECTOMY AND DECOMPRESSION;  Surgeon: Ashok Pall, MD;  Location: [facility];  Service: Neurosurgery;  Laterality: N/A;   PACEMAKER IMPLANT N/A 10/12/2020   Procedure: PACEMAKER IMPLANT;  Surgeon: Vickie Epley, MD;  Location: [place] [facility];  Service: Cardiovascular;  Laterality: N/A;   TEMPORARY PACEMAKER N/A 10/11/2020   Procedure: TEMPORARY PACEMAKER;  Surgeon: Nelva Bush, MD;  Location: [place] [facility];  Service: Cardiovascular;  Laterality: N/A;   THYROIDECTOMY     UPPER GASTROINTESTINAL ENDOSCOPY       Current Meds  Medication Sig   Acetaminophen-Codeine 300-30 MG tablet TAKE 1 TABLET BY MOUTH EVERY 12 (TWELVE)  HOURS AS NEEDED FOR PAIN (MODERATE TO SEVERE PAIN).   albuterol (PROVENTIL HFA;VENTOLIN HFA) 108 (90 Base) MCG/ACT inhaler Inhale 2 puffs into the lungs every 6 (six) hours as needed for wheezing or shortness of breath.   amitriptyline (ELAVIL) 75 MG tablet TAKE 2 TABLETS AT  BEDTIME   ascorbic acid (VITAMIN C) 500 MG tablet Take 500 mg by mouth daily.   Cholecalciferol (VITAMIN D) 2000 units tablet Take 2,000 Units by mouth daily.    Continuous Blood Gluc Sensor (FREESTYLE LIBRE 14 DAY SENSOR) MISC 1 Package by Does not apply route every 14 (fourteen) days.   Cyanocobalamin (DODEX IJ) Inject as directed every 30 (thirty) days.   docusate sodium (COLACE) 100 MG capsule Take 1 capsule (100 mg total) by mouth 2 (two) times daily.   ezetimibe (ZETIA) 10 MG tablet Take 1 tablet (10 mg total) by mouth daily.   ferrous sulfate 324 MG TBEC Take 324 mg by mouth every other day.   furosemide (LASIX) 20 MG tablet Take 0.5 tablets (10 mg total) by mouth daily.   gabapentin (NEURONTIN) 100 MG capsule Take 2 capsules (200 mg total) by mouth 3 (three) times daily.   glucagon (GLUCAGON EMERGENCY) 1 MG injection Inject 1 mg into the vein once as needed. For hypoglycemic episodes. E11.42   Insulin Glargine (LANTUS SOLOSTAR [place]) Inject 16 Units into the skin at bedtime.   insulin lispro (HUMALOG KWIKPEN) 100 UNIT/ML KwikPen Inject 8 Units into the skin 3 (three) times daily. Max Daily 30 units   Insulin Pen Needle 32G X 4 MM MISC 1 Device by Does not apply route in the morning, at noon, in the evening, and at bedtime.   levothyroxine (SYNTHROID) 150 MCG tablet TAKE 1 TABLET DAILY   lisinopril (ZESTRIL) 2.5 MG tablet Take 1 tablet (2.5 mg total) by mouth daily. Take for blood pressure greater than 140/90   metoprolol tartrate (LOPRESSOR) 25 MG tablet Take 1 tablet (25 mg total) by mouth 2 (two) times daily.   Misc. Devices (BARIATRIC ROLLATOR) MISC Use as needed   omeprazole (PRILOSEC) 40 MG capsule TAKE 1 CAPSULE TWICE A DAY   ondansetron (ZOFRAN ODT) 4 MG disintegrating tablet Take 1 tablet (4 mg total) by mouth every 8 (eight) hours as needed for nausea or vomiting.   pyridOXINE (VITAMIN B-6) 100 MG tablet Take 100 mg by mouth daily.    Allergies: Pravachol [pravastatin]  Social  History   Tobacco Use   Smoking status: Never   Smokeless tobacco: Never  Vaping Use   Vaping Use: Never used  Substance Use Topics   Alcohol use: No   Drug use: No    Family History  Problem Relation Age of Onset   Diabetes Mother    Arthritis Mother    Hyperlipidemia Mother    Mental illness Mother    Heart disease Father    Mental illness Sister    Arthritis Maternal Grandmother    Arthritis Maternal Grandfather    Colon cancer Neg Hx    Stomach cancer Neg Hx    Esophageal cancer Neg Hx    Rectal cancer Neg Hx    Colon polyps Neg Hx     Review of Systems: A 12-system review of systems was performed and was negative except as noted in the HPI.  --------------------------------------------------------------------------------------------------  Physical Exam: BP 140/60 (BP Location: Right Arm, Patient Position: Sitting, Cuff Size: Normal)   Pulse 60   Ht 5\' 2"  (1.575 m)   Wt 132 lb (  59.9 kg)   SpO2 93%   BMI 24.14 kg/m   General:  NAD. Neck: No JVD or HJR. Lungs: Clear to auscultation bilaterally without wheezes or crackles. Heart: Regular rate and rhythm without murmurs, rubs, or gallops.  Left anterior chest pacemaker incision well-healed. Abdomen: Soft, nontender, nondistended. Extremities: Trace ankle edema bilaterally.  EKG: AV dual paced rhythm 60 bpm.  Lab Results  Component Value Date   WBC 10.9 (H) 11/07/2020   HGB 11.9 (L) 11/07/2020   HCT 36.2 11/07/2020   MCV 94.0 11/07/2020   PLT 218 11/07/2020    Lab Results  Component Value Date   NA 135 11/07/2020   K 4.6 11/07/2020   CL 97 (L) 11/07/2020   CO2 28 11/07/2020   BUN 36 (H) 11/07/2020   CREATININE 1.12 (H) 11/07/2020   GLUCOSE 240 (H) 11/07/2020   ALT 59 (H) 10/15/2020    Lab Results  Component Value Date   CHOL 160 10/14/2019   HDL 64.10 10/14/2019   LDLCALC 78 10/14/2019   TRIG 89.0 10/14/2019   CHOLHDL 2 10/14/2019     --------------------------------------------------------------------------------------------------  ASSESSMENT AND PLAN: Complete heart block status post pacemaker: EKG today demonstrates AV paced rhythm.  Ms. Hank denies syncope since leaving the hospital, though she had several mechanical falls due to generalized weakness and is now in acute rehab.  She should continue remote device monitoring and follow-up through the device clinic and Dr. Lalla Brothers.  Coronary artery disease and hyperlipidemia associated with type 2 diabetes mellitus: Catheterization at the time of hospitalization with complete heart block showed severe disease involving a relatively small RCA as well as mild-moderate left coronary disease.  As Ms. Paule has not had any angina, I think it is reasonable to continue with medical therapy.  She is currently only on ezetimibe, as her daughter is concerned about potential cognitive effects associated with statins.  We discussed the rationale behind statin therapy, particularly in the setting of secondary prevention with patients with established ASCVD.  We will plan to recheck a lipid panel today.  I would encourage Ms. Dimartino and her daughter to consider transitioning from ezetimibe to a statin, particularly if LDL is above 70.  Acute kidney injury: Likely a combination of hypoperfusion in the setting of complete heart block as well as contrast-induced nephropathy.  Creatinine had improved on last check and was almost back to baseline.  I will check a CMP today to reassess kidney function as well as electrolytes and LFTs with potential for addition of a statin.  Acute respiratory failure with hypoxia: Patient had some respiratory failure during her initial hospitalization due to volume overload in the setting of IV hydration for management of AKI.  Echocardiogram showed grade 1 diastolic dysfunction but normal LVEF.  I do not feel that she has overt heart failure and appears euvolemic  on exam today.  We will continue with low-dose furosemide.  Oxygen saturations today are reasonable on room air.  Hypertension: Blood pressure borderline today.  Blood pressure borderline elevated today.  As Ms. Amezcua continues to recover from her recent hospitalizations, we will defer making medication changes at this time, continuing with low-dose metoprolol and lisinopril.  Follow-up: Return to clinic in 1 month with APP.  Yvonne Kendall, MD 11/17/2020 1:56 PM

## 2020-11-18 ENCOUNTER — Encounter: Payer: Self-pay | Admitting: Internal Medicine

## 2020-11-18 DIAGNOSIS — I25118 Atherosclerotic heart disease of native coronary artery with other forms of angina pectoris: Secondary | ICD-10-CM | POA: Insufficient documentation

## 2020-11-18 DIAGNOSIS — I2510 Atherosclerotic heart disease of native coronary artery without angina pectoris: Secondary | ICD-10-CM | POA: Insufficient documentation

## 2020-11-18 LAB — COMPREHENSIVE METABOLIC PANEL
ALT: 13 IU/L (ref 0–32)
AST: 14 IU/L (ref 0–40)
Albumin/Globulin Ratio: 1.8 (ref 1.2–2.2)
Albumin: 4.2 g/dL (ref 3.5–4.6)
Alkaline Phosphatase: 90 IU/L (ref 44–121)
BUN/Creatinine Ratio: 17 (ref 12–28)
BUN: 16 mg/dL (ref 10–36)
Bilirubin Total: 0.3 mg/dL (ref 0.0–1.2)
CO2: 26 mmol/L (ref 20–29)
Calcium: 8.9 mg/dL (ref 8.7–10.3)
Chloride: 100 mmol/L (ref 96–106)
Creatinine, Ser: 0.95 mg/dL (ref 0.57–1.00)
Globulin, Total: 2.3 g/dL (ref 1.5–4.5)
Glucose: 202 mg/dL — ABNORMAL HIGH (ref 70–99)
Potassium: 4.7 mmol/L (ref 3.5–5.2)
Sodium: 141 mmol/L (ref 134–144)
Total Protein: 6.5 g/dL (ref 6.0–8.5)
eGFR: 57 mL/min/{1.73_m2} — ABNORMAL LOW (ref >59)

## 2020-11-18 LAB — LIPID PANEL
Chol/HDL Ratio: 2.8 ratio (ref 0.0–4.4)
Cholesterol, Total: 168 mg/dL (ref 100–199)
HDL: 59 mg/dL (ref >39)
LDL Chol Calc (NIH): 92 mg/dL (ref 0–99)
Triglycerides: 91 mg/dL (ref 0–149)
VLDL Cholesterol Cal: 17 mg/dL (ref 5–40)

## 2020-11-21 DIAGNOSIS — M48062 Spinal stenosis, lumbar region with neurogenic claudication: Secondary | ICD-10-CM

## 2020-11-21 DIAGNOSIS — F32A Depression, unspecified: Secondary | ICD-10-CM

## 2020-11-21 DIAGNOSIS — I959 Hypotension, unspecified: Secondary | ICD-10-CM

## 2020-11-21 DIAGNOSIS — N1831 Chronic kidney disease, stage 3a: Secondary | ICD-10-CM

## 2020-11-21 DIAGNOSIS — I442 Atrioventricular block, complete: Secondary | ICD-10-CM | POA: Diagnosis not present

## 2020-11-21 DIAGNOSIS — I130 Hypertensive heart and chronic kidney disease with heart failure and stage 1 through stage 4 chronic kidney disease, or unspecified chronic kidney disease: Secondary | ICD-10-CM

## 2020-11-21 DIAGNOSIS — E039 Hypothyroidism, unspecified: Secondary | ICD-10-CM

## 2020-11-21 DIAGNOSIS — E1142 Type 2 diabetes mellitus with diabetic polyneuropathy: Secondary | ICD-10-CM

## 2020-11-21 DIAGNOSIS — K219 Gastro-esophageal reflux disease without esophagitis: Secondary | ICD-10-CM

## 2020-11-21 DIAGNOSIS — Z950 Presence of cardiac pacemaker: Secondary | ICD-10-CM

## 2020-11-21 DIAGNOSIS — E1122 Type 2 diabetes mellitus with diabetic chronic kidney disease: Secondary | ICD-10-CM

## 2020-11-21 DIAGNOSIS — E440 Moderate protein-calorie malnutrition: Secondary | ICD-10-CM

## 2020-11-21 DIAGNOSIS — Z48812 Encounter for surgical aftercare following surgery on the circulatory system: Secondary | ICD-10-CM

## 2020-11-21 DIAGNOSIS — E785 Hyperlipidemia, unspecified: Secondary | ICD-10-CM

## 2020-11-21 DIAGNOSIS — Z9181 History of falling: Secondary | ICD-10-CM

## 2020-11-21 DIAGNOSIS — Z8616 Personal history of COVID-19: Secondary | ICD-10-CM

## 2020-11-21 DIAGNOSIS — Z9981 Dependence on supplemental oxygen: Secondary | ICD-10-CM

## 2020-11-21 DIAGNOSIS — F419 Anxiety disorder, unspecified: Secondary | ICD-10-CM

## 2020-11-21 DIAGNOSIS — E538 Deficiency of other specified B group vitamins: Secondary | ICD-10-CM

## 2020-11-21 DIAGNOSIS — G8929 Other chronic pain: Secondary | ICD-10-CM

## 2020-11-21 DIAGNOSIS — Z87440 Personal history of urinary (tract) infections: Secondary | ICD-10-CM

## 2020-11-21 DIAGNOSIS — M4726 Other spondylosis with radiculopathy, lumbar region: Secondary | ICD-10-CM

## 2020-11-21 DIAGNOSIS — I5033 Acute on chronic diastolic (congestive) heart failure: Secondary | ICD-10-CM

## 2020-11-21 DIAGNOSIS — E1165 Type 2 diabetes mellitus with hyperglycemia: Secondary | ICD-10-CM

## 2020-11-21 DIAGNOSIS — D631 Anemia in chronic kidney disease: Secondary | ICD-10-CM

## 2020-11-24 ENCOUNTER — Ambulatory Visit: Payer: Medicare HMO | Admitting: Internal Medicine

## 2020-11-30 ENCOUNTER — Telehealth: Payer: Self-pay | Admitting: Family

## 2020-11-30 DIAGNOSIS — M1612 Unilateral primary osteoarthritis, left hip: Secondary | ICD-10-CM

## 2020-11-30 MED ORDER — ACETAMINOPHEN-CODEINE 300-30 MG PO TABS: 1.0000 | ORAL_TABLET | Freq: Two times a day (BID) | ORAL | 0 refills | Status: DC | PRN

## 2020-11-30 NOTE — Telephone Encounter (Signed)
Call pt  I refilled tylenol #3 She had had refilled from [facility] - is he/she from skilled nursing?  I looked up patient on [place] Controlled Substances Reporting System PMP AWARE and saw no activity that raised concern of inappropriate use.

## 2020-11-30 NOTE — Telephone Encounter (Signed)
Pt daughter [name] called in stating that her mother was release from skilled care facility. Pt daughter schedule appointment for Pt on 12/07/2020 at 2:00pm. Pt daughter stated that Pt ran out of medication (Acetaminophen-Codeine 300-30 MG tablet). Pt daughter would like to know if NP Arnett can refill her medication until her upcoming appt.

## 2020-11-30 NOTE — Telephone Encounter (Signed)
Amy Calhoun was from skilled nursing from which she is now discharged & had given her limited number of tablets.  Pt's daughter aware that prescription was sent.

## 2020-12-07 ENCOUNTER — Ambulatory Visit (INDEPENDENT_AMBULATORY_CARE_PROVIDER_SITE_OTHER): Payer: Medicare HMO

## 2020-12-07 ENCOUNTER — Encounter: Payer: Self-pay | Admitting: Family

## 2020-12-07 ENCOUNTER — Ambulatory Visit (INDEPENDENT_AMBULATORY_CARE_PROVIDER_SITE_OTHER): Payer: Medicare HMO | Admitting: Family

## 2020-12-07 ENCOUNTER — Other Ambulatory Visit: Payer: Self-pay

## 2020-12-07 VITALS — BP 118/58 | HR 71 | Temp 96.0°F | Ht 62.0 in | Wt 133.0 lb

## 2020-12-07 DIAGNOSIS — I5033 Acute on chronic diastolic (congestive) heart failure: Secondary | ICD-10-CM

## 2020-12-07 DIAGNOSIS — K224 Dyskinesia of esophagus: Secondary | ICD-10-CM | POA: Diagnosis not present

## 2020-12-07 DIAGNOSIS — I10 Essential (primary) hypertension: Secondary | ICD-10-CM

## 2020-12-07 DIAGNOSIS — E1129 Type 2 diabetes mellitus with other diabetic kidney complication: Secondary | ICD-10-CM | POA: Diagnosis not present

## 2020-12-07 DIAGNOSIS — I442 Atrioventricular block, complete: Secondary | ICD-10-CM

## 2020-12-07 DIAGNOSIS — Z79899 Other long term (current) drug therapy: Secondary | ICD-10-CM

## 2020-12-07 DIAGNOSIS — Z794 Long term (current) use of insulin: Secondary | ICD-10-CM

## 2020-12-07 DIAGNOSIS — G4700 Insomnia, unspecified: Secondary | ICD-10-CM | POA: Diagnosis not present

## 2020-12-07 MED ORDER — METOPROLOL TARTRATE 25 MG PO TABS: 12.5000 mg | ORAL_TABLET | Freq: Two times a day (BID) | ORAL | 1 refills | Status: DC

## 2020-12-07 MED ORDER — GLUCAGON (RDNA) 1 MG IJ KIT: 1.0000 mg | PACK | Freq: Once | INTRAMUSCULAR | 3 refills | Status: DC | PRN

## 2020-12-07 MED ORDER — AMITRIPTYLINE HCL 100 MG PO TABS: 100.0000 mg | ORAL_TABLET | Freq: Every day | ORAL | 1 refills | Status: DC

## 2020-12-07 MED ORDER — FUROSEMIDE 20 MG PO TABS: 10.0000 mg | ORAL_TABLET | Freq: Every day | ORAL | 1 refills | Status: DC | PRN

## 2020-12-07 NOTE — Assessment & Plan Note (Signed)
Uncontrolled.  Recent episodes difficulty swallowing, taking medications.  It is certainly of concern as discussed with daughter today in the setting of diabetes and hypoglycemic episode.  Provided refill for glucagon IM today.  Advised patient to have follow-up with Dr. Christella Hartigan promptly to evaluate for dysmotility and need for esophageal esophageal diet dilatation again.  Daughter states that she will arrange. Continue Prilosec 40 mg twice daily.  Advised to let me know how she is doing.

## 2020-12-07 NOTE — Assessment & Plan Note (Addendum)
I  had a long discussion with daughter today and we jointly expressed our concern as it relates to polypharmacy.  Certainly patient is on multiple sedating medications including gabapentin 400mg  TID, amitriptyline 150mg  qhs , acetaminophen-codeine 300-30mg , ondansetron ODT ( rare use).  No overt symptoms to suggest fluid volume overload today and advised patient to slowly decrease use of Lasix 10mg  qd to as needed usage.  Furthermore in the recent history of dizziness, I think this is relevant.  Daughter will be careful to monitor for weight, shortness of breath, edema.   She is a great risk for recurrent falls.  We discussed risk of tricyclic antidepressants and my first recommendation was to slowly decrease amitriptyline to see if pain, headache or insomnia were to worsen. Decrease amitriptyline to 100mg .  We can gradually assess other sedating medications as well.  Placed a referral to clinical pharmacist to further help review medications and make recommendations of medications and dosing for which would be safest for patient.  Will maintain close follow-up

## 2020-12-07 NOTE — Progress Notes (Signed)
Subjective:    Patient ID: Amy Calhoun, female    DOB: 1928-04-09, 87 y.o.   MRN: [id number]  CC: Amy Calhoun is a 87 y.o. female who presents today for follow up.   HPI: HPI Accompanied by daughter , [name], today who is primary historian  She is no longer in rehab nursing facility and has been living with daughter for a couple of weeks. She is not going back to retirement community and daughter and her husband are currently considering plans for the future.   Daughter describes recent escalation of trouble swallowing with vomiting. Soups and bread are difficult. She has has periods having trouble taking medication and daughter is crushing pills in applesauce.  A particular concern is patient had a hypoglycemic episode and daughter was concerned her mother would not be able to correct with oral glucose due to trouble swallowing.  She has an expired glucagon IM and requests a refill today.  Esophageal dilatation 09/28/20. Compliant with prilosec 40mg  BID. No fever, SOB, cough.   H/o small chronic hiatal hernia seen ct c/p 07/24/2017.   Hypertension-compliant with lisinopril 2.5 mg,  daughter self decreased to half tablet of metoprolol tartrate 25 mg twice daily due to episode of dizziness 10 days ago. Dizziness has since improved  She has also decreased lasix 10mg  QOD due to dizziness and absence of LE edema.   CAD, complete heart block- Consult with Dr. Saunders Revel 11/17/2020. She has follow up with Dr Quentin [facility] 01/2021. He encouraged to start statin and discontinue zetia. He did not feel that she had overt heart failure.  He continued lasix 10mg   DM-compliant with Lantus 14 units, humalog 8 units with meal and SSI as prescribed by Dr. Gabriel Carina . Follow up scheduled 12/12/20. No hypoglycemic  since living with daughter and decreasing lantus from 16 units to 14 units.   Daughter expresses concern for polypharmacy and drug interactions.  She questions what medications may be causing  dizziness, fatigue.  She questions if any medications could be decreased  Recent h/o AKI . Crt 0.95 ( prior 1.12)  LDL 92 two weeks ago    HISTORY:  Past Medical History:  Diagnosis Date   Allergy    Ambulates with cane    Anemia    At high risk for falls    Cataract    removed both eyes   Chronic kidney disease 08/07/2016   Chronic renal failure, stage III   Depression    Esophageal dysphagia    Gastric ulcer    GERD (gastroesophageal reflux disease)    Hyperlipidemia    Hypertension    Hypothyroidism    IDDM (insulin dependent diabetes mellitus)    Ischemic colitis ([place])    Neuromuscular disorder ([place])    neuropathy   Osteoarthritis    Ulcer of esophagus    Past Surgical History:  Procedure Laterality Date   ABDOMINAL HYSTERECTOMY     CATARACT EXTRACTION, BILATERAL     CHOLECYSTECTOMY     COLONOSCOPY  2010   ESOPHAGEAL DILATION  08/07/2016   usually a couple times a year, last time 07/30/16   GALLBLADDER SURGERY     LEFT HEART CATH AND CORONARY ANGIOGRAPHY N/A 10/11/2020   Procedure: LEFT HEART CATH AND CORONARY ANGIOGRAPHY;  Surgeon: Nelva Bush, MD;  Location: [facility];  Service: Cardiovascular;  Laterality: N/A;   LUMBAR LAMINECTOMY/DECOMPRESSION MICRODISCECTOMY N/A 08/10/2016   Procedure: BILATERAL HEMILAMINECTOMY LUMBAR THREE-FOUR,LUMBAR FOUR-FIVE,LEFT LUMBAR FIVE-SACRAL ONE HEMILAMINECTOMY AND DECOMPRESSION;  Surgeon: Ashok Pall, MD;  Location: [facility];  Service: Neurosurgery;  Laterality: N/A;   PACEMAKER IMPLANT N/A 10/12/2020   Procedure: PACEMAKER IMPLANT;  Surgeon: Vickie Epley, MD;  Location: [facility];  Service: Cardiovascular;  Laterality: N/A;   TEMPORARY PACEMAKER N/A 10/11/2020   Procedure: TEMPORARY PACEMAKER;  Surgeon: Nelva Bush, MD;  Location: [facility];  Service: Cardiovascular;  Laterality: N/A;   THYROIDECTOMY     UPPER GASTROINTESTINAL ENDOSCOPY     Family History  Problem Relation Age of  Onset   Diabetes Mother    Arthritis Mother    Hyperlipidemia Mother    Mental illness Mother    Heart disease Father    Mental illness Sister    Arthritis Maternal Grandmother    Arthritis Maternal Grandfather    Colon cancer Neg Hx    Stomach cancer Neg Hx    Esophageal cancer Neg Hx    Rectal cancer Neg Hx    Colon polyps Neg Hx     Allergies: Pravachol [pravastatin] Current Outpatient Medications on File Prior to Visit  Medication Sig Dispense Refill   Acetaminophen-Codeine 300-30 MG tablet Take 1 tablet by mouth every 12 (twelve) hours as needed for pain (moderate to severe pain). 60 tablet 0   albuterol (PROVENTIL HFA;VENTOLIN HFA) 108 (90 Base) MCG/ACT inhaler Inhale 2 puffs into the lungs every 6 (six) hours as needed for wheezing or shortness of breath. 1 Inhaler 2   ascorbic acid (VITAMIN C) 500 MG tablet Take 500 mg by mouth daily.     Cholecalciferol (VITAMIN D) 2000 units tablet Take 2,000 Units by mouth daily.      Continuous Blood Gluc Sensor (FREESTYLE LIBRE 14 DAY SENSOR) MISC 1 Package by Does not apply route every 14 (fourteen) days. 6 each 3   Cyanocobalamin (DODEX IJ) Inject as directed every 30 (thirty) days.     docusate sodium (COLACE) 100 MG capsule Take 1 capsule (100 mg total) by mouth 2 (two) times daily. 10 capsule 0   ezetimibe (ZETIA) 10 MG tablet Take 1 tablet (10 mg total) by mouth daily. 90 tablet 3   ferrous sulfate 324 MG TBEC Take 324 mg by mouth every other day.     gabapentin (NEURONTIN) 100 MG capsule Take 2 capsules (200 mg total) by mouth 3 (three) times daily. 540 capsule 3   Insulin Glargine (LANTUS SOLOSTAR [place]) Inject 14 Units into the skin at bedtime.     insulin lispro (HUMALOG KWIKPEN) 100 UNIT/ML KwikPen Inject 8 Units into the skin 3 (three) times daily. Max Daily 30 units 30 mL 3   Insulin Pen Needle 32G X 4 MM MISC 1 Device by Does not apply route in the morning, at noon, in the evening, and at bedtime. 150 each 11   levothyroxine  (SYNTHROID) 150 MCG tablet TAKE 1 TABLET DAILY 90 tablet 3   lisinopril (ZESTRIL) 2.5 MG tablet Take 1 tablet (2.5 mg total) by mouth daily. Take for blood pressure greater than 140/90 90 tablet 1   Misc. Devices (BARIATRIC ROLLATOR) MISC Use as needed 1 each 0   omeprazole (PRILOSEC) 40 MG capsule TAKE 1 CAPSULE TWICE A DAY 180 capsule 3   ondansetron (ZOFRAN ODT) 4 MG disintegrating tablet Take 1 tablet (4 mg total) by mouth every 8 (eight) hours as needed for nausea or vomiting. 30 tablet 0   pyridOXINE (VITAMIN B-6) 100 MG tablet Take 100 mg by mouth daily.     No current  facility-administered medications on file prior to visit.    Social History   Tobacco Use   Smoking status: Never   Smokeless tobacco: Never  Vaping Use   Vaping Use: Never used  Substance Use Topics   Alcohol use: No   Drug use: No    Review of Systems  Constitutional:  Negative for chills and fever.  HENT:  Positive for hearing loss and trouble swallowing. Negative for congestion.   Respiratory:  Negative for cough, shortness of breath and wheezing.   Cardiovascular:  Negative for chest pain, palpitations and leg swelling.  Gastrointestinal:  Negative for nausea and vomiting.     Objective:    BP (!) 118/58   Pulse 71   Temp (!) 96 F (35.6 C) (Skin)   Ht 5\' 2"  (1.575 m)   Wt 133 lb (60.3 kg)   SpO2 97%   BMI 24.33 kg/m  BP Readings from Last 3 Encounters:  12/07/20 (!) 118/58  11/17/20 140/60  11/08/20 136/84   Wt Readings from Last 3 Encounters:  12/07/20 133 lb (60.3 kg)  11/17/20 132 lb (59.9 kg)  11/05/20 130 lb (59 kg)    Physical Exam Vitals reviewed.  Constitutional:      Appearance: She is well-developed.  Eyes:     Conjunctiva/sclera: Conjunctivae normal.  Cardiovascular:     Rate and Rhythm: Normal rate and regular rhythm.     Pulses: Normal pulses.     Heart sounds: Normal heart sounds.  Pulmonary:     Effort: Pulmonary effort is normal.     Breath sounds: Normal  breath sounds. No wheezing, rhonchi or rales.  Musculoskeletal:     Right lower leg: No edema.     Left lower leg: No edema.  Skin:    General: Skin is warm and dry.  Neurological:     Mental Status: She is alert.  Psychiatric:        Speech: Speech normal.        Behavior: Behavior normal.        Thought Content: Thought content normal.       Assessment & Plan:   Problem List Items Addressed This Visit       Cardiovascular and Mediastinum   Acute on chronic diastolic CHF (congestive heart failure) (HCC)   Relevant Medications   metoprolol tartrate (LOPRESSOR) 25 MG tablet   furosemide (LASIX) 20 MG tablet   Other Relevant Orders   AMB Referral to [facility]   Essential hypertension    Well-controlled.  Continue lisinopril 2.5 mg, metoprolol tartrate 12.5 mg twice daily .  In the absence of lower extremity edema, shortness of breath, overt symptoms of heart failure, and recent h/o dizziness, I have advised to slowly wean off of Lasix 10 mg by taking every other day and every third day, to stop.  Daughter will monitor for symptoms of orthopnea, edema, weight gain, shortness of breath.        Relevant Medications   metoprolol tartrate (LOPRESSOR) 25 MG tablet   furosemide (LASIX) 20 MG tablet     Digestive   Esophageal dysmotility - Primary    Uncontrolled.  Recent episodes difficulty swallowing, taking medications.  It is certainly of concern as discussed with daughter today in the setting of diabetes and hypoglycemic episode.  Provided refill for glucagon IM today.  Advised patient to have follow-up with Dr. [facility] promptly to evaluate for dysmotility and need for esophageal esophageal diet dilatation again.  Daughter states that  she will arrange. Continue Prilosec 40 mg twice daily.  Advised to let me know how she is doing.      Relevant Orders   DG Chest 2 View     Endocrine   DM (diabetes mellitus), type 2 with renal complications ([place])    Lab Results   Component Value Date   HGBA1C 9.4 (A) 09/21/2020  Uncontrolled which complicated by comorbidities and labile blood sugar.  Further complicated recently by trouble swallowing, choking.  Refilled glucagon IM today.  Patient has follow-up with Dr. Gabriel Carina next week and has had no further hypoglycemic episodes since she has been living with daughter, decreased Lantus from 16 units to 14 units.  Advised her to continue very close follow-up with Dr. Gabriel Carina.      Relevant Medications   glucagon 1 MG injection     Other   Insomnia   Relevant Medications   amitriptyline (ELAVIL) 100 MG tablet   Polypharmacy    I  had a long discussion with daughter today and we jointly expressed our concern as it relates to polypharmacy.  Certainly patient is on multiple sedating medications including gabapentin 400mg  TID, amitriptyline 150mg  qhs , acetaminophen-codeine 300-30mg , ondansetron ODT ( rare use).  No overt symptoms to suggest fluid volume overload today and advised patient to slowly decrease use of Lasix 10mg  qd to as needed usage.  Furthermore in the recent history of dizziness, I think this is relevant.  Daughter will be careful to monitor for weight, shortness of breath, edema.   She is a great risk for recurrent falls.  We discussed risk of tricyclic antidepressants and my first recommendation was to slowly decrease amitriptyline to see if pain, headache or insomnia were to worsen. Decrease amitriptyline to 100mg .  We can gradually assess other sedating medications as well.  Placed a referral to clinical pharmacist to further help review medications and make recommendations of medications and dosing for which would be safest for patient.  Will maintain close follow-up      Other Visit Diagnoses     HTN (hypertension), malignant       Relevant Medications   metoprolol tartrate (LOPRESSOR) 25 MG tablet   furosemide (LASIX) 20 MG tablet   Complete heart block (HCC)       Relevant Medications    metoprolol tartrate (LOPRESSOR) 25 MG tablet   furosemide (LASIX) 20 MG tablet        I have discontinued Amy Calhoun's glucagon, amitriptyline, and glucagon. I have also changed her metoprolol tartrate and furosemide. Additionally, I am having her start on amitriptyline and glucagon. Lastly, I am having her maintain her pyridOXINE, docusate sodium, albuterol, Vitamin D, ondansetron, Bariatric Rollator, FreeStyle Libre 14 Day Sensor, ascorbic acid, Insulin Pen Needle, levothyroxine, ezetimibe, omeprazole, insulin lispro, gabapentin, lisinopril, Cyanocobalamin (DODEX IJ), ferrous sulfate, Insulin Glargine (LANTUS SOLOSTAR [place]), and Acetaminophen-Codeine.   Meds ordered this encounter  Medications   amitriptyline (ELAVIL) 100 MG tablet    Sig: Take 1 tablet (100 mg total) by mouth at bedtime.    Dispense:  90 tablet    Refill:  1    Order Specific Question:   Supervising Provider    Answer:   Crecencio Mc [[id number]]   DISCONTD: glucagon 1 MG injection    Sig: Inject 1 mg into the vein once as needed for up to 1 dose. For hypoglycemic episodes. E11.42    Dispense:  2 each    Refill:  3    Order Specific  Question:   Supervising Provider    Answer:   Crecencio Mc [[id number]]   glucagon 1 MG injection    Sig: Inject 1 mg into the muscle once as needed for up to 1 dose. Hypoglycemia If she is not tolerating PO.    Dispense:  2 each    Refill:  3    Discontinue prior glucagon which had intravenous preparation    Order Specific Question:   Supervising Provider    Answer:   Deborra Medina L [[id number]]   metoprolol tartrate (LOPRESSOR) 25 MG tablet    Sig: Take 0.5 tablets (12.5 mg total) by mouth 2 (two) times daily.    Dispense:  120 tablet    Refill:  1    Order Specific Question:   Supervising Provider    Answer:   Deborra Medina L [[id number]]   furosemide (LASIX) 20 MG tablet    Sig: Take 0.5 tablets (10 mg total) by mouth daily as needed for edema.    Dispense:  30 tablet    Refill:  1     Order Specific Question:   Supervising Provider    Answer:   Crecencio Mc [[id number]]    Return precautions given.   Risks, benefits, and alternatives of the medications and treatment plan prescribed today were discussed, and patient expressed understanding.   Education regarding symptom management and diagnosis given to patient on AVS.  Continue to follow with Burnard Hawthorne, FNP for routine health maintenance.   [place] and I agreed with plan.   Mable Paris, FNP   I have spent 40 minutes with a patient including precharting, exam, reviewing medical records, medication reconciliation, and discussion plan of care.

## 2020-12-07 NOTE — Assessment & Plan Note (Signed)
Lab Results  Component Value Date   HGBA1C 9.4 (A) 09/21/2020   Uncontrolled which complicated by comorbidities and labile blood sugar.  Further complicated recently by trouble swallowing, choking.  Refilled glucagon IM today.  Patient has follow-up with Dr. Tedd Sias next week and has had no further hypoglycemic episodes since she has been living with daughter, decreased Lantus from 16 units to 14 units.  Advised her to continue very close follow-up with Dr. Tedd Sias.

## 2020-12-07 NOTE — Assessment & Plan Note (Addendum)
Well-controlled.  Continue lisinopril 2.5 mg, metoprolol tartrate 12.5 mg twice daily .  In the absence of lower extremity edema, shortness of breath, overt symptoms of heart failure, and recent h/o dizziness, I have advised to slowly wean off of Lasix 10 mg by taking every other day and every third day, to stop.  Daughter will monitor for symptoms of orthopnea, edema, weight gain, shortness of breath.

## 2020-12-07 NOTE — Patient Instructions (Addendum)
Call Dr Ardis Hughs to schedule follow up. Let me know if swallowing remains difficult or certainly if worsens.   Decrease nortriptyline to $RemoveBeforeDE'100mg'[id number]$  from $Rem'150mg'tlRI$  .   Please keep glucagon IM with you at all times to use an hypoglycemic episode in which you are not tolerating swallowing well.   Referral to pharmacy  Let us know if you dont hear back within a week in regards to an appointment being scheduled.   Glucagon Injection What is this medication? GLUCAGON (GLU ka gon) increases blood sugar. This medicine is used as an emergency treatment for severe low blood sugar in patients with diabetes. It is also used during certain diagnostic exams of the stomach and other digestive organs. This medicine may be used for other purposes; ask your health care provider or pharmacist if you have questions. COMMON BRAND NAME(S): GlucaGen, Glucagon, Gvoke, Gvoke HypoPen, Gvoke PFS What should I tell my care team before I take this medication? They need to know if you have any of these conditions: eating less due to illness, surgery, dieting, or any other reason heart disease low adrenal gland function pancreatic tumor pheochromocytoma poor nutrition an unusual or allergic reaction to glucagon, other medicines, foods, dyes, or preservatives pregnant or trying to get pregnant breast-feeding How should I use this medication? This medicine is injected under the skin or into a muscle. When this medicine is injected into a vein, it is given by a health care provider in a hospital or clinic setting. If you get this medicine at home, you will be taught how to prepare and give it. Use exactly as directed. Do not take your medicine more often than directed. It is important that you put your used injectors, needles, and syringes in a special sharps container. Do not put them in a trash can. If you do not have a sharps container, call your pharmacist or health care provider to get one. This medicine comes with  INSTRUCTIONS FOR USE. Ask your pharmacist for directions on how to use this medicine. Read the information carefully. Talk to your pharmacist or health care provider if you have questions. Talk to your pediatrician regarding the use of this medicine in children. While this medicine may be prescribed for selected conditions, precautions do apply. Overdosage: If you think you have taken too much of this medicine contact a poison control center or emergency room at once. NOTE: This medicine is only for you. Do not share this medicine with others. What if I miss a dose? This does not apply. This medicine is not for regular use. What may interact with this medication? Interactions are not expected. This list may not describe all possible interactions. Give your health care provider a list of all the medicines, herbs, non-prescription drugs, or dietary supplements you use. Also tell them if you smoke, drink alcohol, or use illegal drugs. Some items may interact with your medicine. What should I watch for while using this medication? If you receive this medicine as part of a diagnostic test, follow your health care providers advice for eating and drinking after the procedure. If you have this kit to help treat low blood sugar: Always get immediate medical help after receiving an injection of this medicine. This is very important. Do this even if you respond to the medicine and are alert. Keep this kit with you at all times. Wear a medical identification bracelet or chain to say you have diabetes, and carry a card that lists all your medications. Show your family  members and others where you keep this kit. Make sure that you and your family or caregiver know how to use this kit the right way before you need it. They need to know how to use it before you need it. Learn how to check your blood sugar. Learn the symptoms of low and high blood sugar and how to manage them. Always carry a quick-source of sugar  with you in case you have symptoms of low blood sugar. Examples include hard sugar candy or glucose tablets. Make sure others know that you can choke if you eat or drink when you develop serious symptoms of low blood sugar, such as seizures or unconsciousness. They must get medical help at once. Also, remind others that they may need to give you this medicine injection before medical help is available. A repeat injection may be needed while waiting for medical help. After you are alert and can swallow after an injection of this medicine, you should eat or drink some carbohydrates to prevent continued low blood sugar. Do not drive or operate machinery until you are alert and have eaten sugar or a sugar-sweetened product such as a regular soft drink or fruit juice. What side effects may I notice from receiving this medication? Side effects that you should report to your doctor or health care professional as soon as possible: allergic reactions (skin rash, itching or hives; swelling of the face, lips, or tongue) chest pain fast, irregular heartbeat increase in blood pressure low blood pressure (dizziness; feeling faint or lightheaded, falls; unusually weak or tired) low blood sugar (feeling anxious; confusion; dizziness; increased hunger; unusually weak or tired; increased sweating; shakiness; cold, clammy skin; irritable; headache; blurred vision; fast heartbeat; loss of consciousness) redness, blistering, peeling, or loosening of the skin, including inside the mouth trouble breathing Side effects that usually do not require medical attention (report to your doctor or health care professional if they continue or are bothersome): diarrhea dizziness headache nausea, vomiting pain, redness, or irritation at site where injected stomach pain This list may not describe all possible side effects. Call your doctor for medical advice about side effects. You may report side effects to FDA at  1-800-FDA-1088. Where should I keep my medication? Keep out of the reach of children and pets. Store at room temperature between 20 and 25 degrees C (68 and 77 degrees F). Store in the original container. Protect from light. Do not refrigerate or freeze. Get rid of any unused medicine after the expiration date. Always replace your medicine before it expires. Get rid of any unused medicine after opening and preparing for use. Do not store for later use. To get rid of medicines that are no longer needed or have expired: Take the medicine to a medicine take-back program. Check with your pharmacy or law enforcement to find a location. If you cannot return the medicine, ask your pharmacist or health care provider how to get rid of this medicine safely. NOTE: This sheet is a summary. It may not cover all possible information. If you have questions about this medicine, talk to your doctor, pharmacist, or health care provider.  2022 Elsevier/Gold Standard (2019-09-10 00:00:00)

## 2020-12-12 ENCOUNTER — Ambulatory Visit (INDEPENDENT_AMBULATORY_CARE_PROVIDER_SITE_OTHER): Payer: Medicare HMO | Admitting: Internal Medicine

## 2020-12-12 ENCOUNTER — Other Ambulatory Visit: Payer: Self-pay

## 2020-12-12 ENCOUNTER — Encounter: Payer: Self-pay | Admitting: Internal Medicine

## 2020-12-12 VITALS — BP 132/70 | HR 61 | Ht 62.0 in | Wt 133.6 lb

## 2020-12-12 DIAGNOSIS — Z794 Long term (current) use of insulin: Secondary | ICD-10-CM

## 2020-12-12 DIAGNOSIS — E1165 Type 2 diabetes mellitus with hyperglycemia: Secondary | ICD-10-CM | POA: Diagnosis not present

## 2020-12-12 LAB — POCT GLYCOSYLATED HEMOGLOBIN (HGB A1C): Hemoglobin A1C: 9.1 % — AB (ref 4.0–5.6)

## 2020-12-12 NOTE — Progress Notes (Signed)
Name: Amy Calhoun  Age/ Sex: 87 y.o., female   MRN/ DOB: [id number], October 21, 1928     PCP: Amy Grana, FNP   Reason for Endocrinology Evaluation: Type 2 Diabetes Mellitus  Initial Endocrine Consultative Visit: 04/15/2018    PATIENT IDENTIFIER: Amy Calhoun is a 87 y.o. female with a past medical history of T2DM, Dyslipidemia, Hypothyroidism , heart block S/P pacemaker and HTN . The patient has followed with Endocrinology clinic since 04/15/2018 for consultative assistance with management of her diabetes.   Patient used to be cared for by her other daughter in [place] but daughter had passed due to a motorcycle accident and Amy Calhoun brought mother to [facility] in 2017.   DIABETIC HISTORY:  Amy Calhoun was diagnosed with DM at age 40. She has been on insulin since her diagnosis.She was briefly on glipizide but this was stopped in March, 2020. Pt did not recall any other oral glycemic agents. Her hemoglobin A1c has ranged from 6.5 % in 2018, peaking at 8.9%  In 2018.  Daughter would like to explore putting mother on glycemic agents, she is concerned about having MDI regimen as mother gets older and worried about future dexterity.   On her initial visit to our clinic, she was on Humulin-N BID, Humulin-R per SS and Tradjenta.  We stopped Tradjenta, started Trulicity in March, 2020, with reduction in insulin doses.   In 09/2018 Replaced Humulin-N with Lantus   SUBJECTIVE:   During the last visit (03/16/2020): A1c 8.6% no changes     Today (12/12/2020): Amy Calhoun is here with daughter Amy Calhoun for a follow up on diabetes .  She checks her blood sugars multiple times a day through the freestyle libre. The patient has not hypoglycemic episodes since the last clinic visit.    She was hospitalized 10/10/2020 for heart block requiring pace maker , her course complicated by AKI  Saw cardiology 11/17/2020 She continues with dysphagia, has hx of multiple esophageal dilatations - saw PCP  11/2020  Presented to the ED 10/22 for a mechanical fall   She has been home for the past 2 weeks     HOME DIABETES REGIMEN:  Lantus 8 units daily - taking 14 units  Humalog 8 units TID QAC CF: Novolog ( BG- 130/60)   CONTINUOUS GLUCOSE MONITORING RECORD INTERPRETATION    Dates of Recording: 11/15-11/28/2022  Sensor description:Freestyle Libre  Results statistics:   CGM use % of time 87  Average and SD 224/36.1  Time in range  34%  % Time Above 180 33  % Time above 250 33  % Time Below target 0     Glycemic patterns summary: Hyperglycemia during the day and night  Hypoglycemic episodes occurred during the day  Overnight periods: trends down     HISTORY:  Past Medical History:  Past Medical History:  Diagnosis Date   Allergy    Ambulates with cane    Anemia    At high risk for falls    Cataract    removed both eyes   Chronic kidney disease 08/07/2016   Chronic renal failure, stage III   Depression    Esophageal dysphagia    Gastric ulcer    GERD (gastroesophageal reflux disease)    Hyperlipidemia    Hypertension    Hypothyroidism    IDDM (insulin dependent diabetes mellitus)    Ischemic colitis (HCC)    Neuromuscular disorder (HCC)    neuropathy   Osteoarthritis    Ulcer of esophagus  Past Surgical History:  Past Surgical History:  Procedure Laterality Date   ABDOMINAL HYSTERECTOMY     CATARACT EXTRACTION, BILATERAL     CHOLECYSTECTOMY     COLONOSCOPY  2010   ESOPHAGEAL DILATION  08/07/2016   usually a couple times a year, last time 07/30/16   GALLBLADDER SURGERY     LEFT HEART CATH AND CORONARY ANGIOGRAPHY N/A 10/11/2020   Procedure: LEFT HEART CATH AND CORONARY ANGIOGRAPHY;  Surgeon: Nelva Bush, MD;  Location: [facility];  Service: Cardiovascular;  Laterality: N/A;   LUMBAR LAMINECTOMY/DECOMPRESSION MICRODISCECTOMY N/A 08/10/2016   Procedure: BILATERAL HEMILAMINECTOMY LUMBAR THREE-FOUR,LUMBAR FOUR-FIVE,LEFT LUMBAR FIVE-SACRAL  ONE HEMILAMINECTOMY AND DECOMPRESSION;  Surgeon: Ashok Pall, MD;  Location: [facility];  Service: Neurosurgery;  Laterality: N/A;   PACEMAKER IMPLANT N/A 10/12/2020   Procedure: PACEMAKER IMPLANT;  Surgeon: Vickie Epley, MD;  Location: [facility];  Service: Cardiovascular;  Laterality: N/A;   TEMPORARY PACEMAKER N/A 10/11/2020   Procedure: TEMPORARY PACEMAKER;  Surgeon: Nelva Bush, MD;  Location: [facility];  Service: Cardiovascular;  Laterality: N/A;   THYROIDECTOMY     UPPER GASTROINTESTINAL ENDOSCOPY     Social History:  reports that she has never smoked. She has never used smokeless tobacco. She reports that she does not drink alcohol and does not use drugs. Family History:  Family History  Problem Relation Age of Onset   Diabetes Mother    Arthritis Mother    Hyperlipidemia Mother    Mental illness Mother    Heart disease Father    Mental illness Sister    Arthritis Maternal Grandmother    Arthritis Maternal Grandfather    Colon cancer Neg Hx    Stomach cancer Neg Hx    Esophageal cancer Neg Hx    Rectal cancer Neg Hx    Colon polyps Neg Hx      HOME MEDICATIONS: Allergies as of 12/12/2020       Reactions   Pravachol [pravastatin] Other (See Comments)   States she refused due to side effects        Medication List        Accurate as of December 12, 2020  4:37 PM. If you have any questions, ask your nurse or doctor.          Acetaminophen-Codeine 300-30 MG tablet Take 1 tablet by mouth every 12 (twelve) hours as needed for pain (moderate to severe pain).   albuterol 108 (90 Base) MCG/ACT inhaler Commonly known as: VENTOLIN HFA Inhale 2 puffs into the lungs every 6 (six) hours as needed for wheezing or shortness of breath.   amitriptyline 100 MG tablet Commonly known as: ELAVIL Take 1 tablet (100 mg total) by mouth at bedtime.   ascorbic acid 500 MG tablet Commonly known as: VITAMIN C Take 500 mg by mouth daily.   Bariatric  Rollator Misc Use as needed   docusate sodium 100 MG capsule Commonly known as: COLACE Take 1 capsule (100 mg total) by mouth 2 (two) times daily.   DODEX IJ Inject as directed every 30 (thirty) days.   ezetimibe 10 MG tablet Commonly known as: ZETIA Take 1 tablet (10 mg total) by mouth daily.   ferrous sulfate 324 MG Tbec Take 324 mg by mouth every other day.   FreeStyle Libre 14 Day Sensor Misc 1 Package by Does not apply route every 14 (fourteen) days.   furosemide 20 MG tablet Commonly known as: Lasix Take 0.5 tablets (10 mg total) by  mouth daily as needed for edema.   gabapentin 100 MG capsule Commonly known as: NEURONTIN Take 2 capsules (200 mg total) by mouth 3 (three) times daily.   glucagon 1 MG injection Inject 1 mg into the muscle once as needed for up to 1 dose. Hypoglycemia If she is not tolerating PO.   insulin lispro 100 UNIT/ML KwikPen Commonly known as: HumaLOG KwikPen Inject 8 Units into the skin 3 (three) times daily. Max Daily 30 units   Insulin Pen Needle 32G X 4 MM Misc 1 Device by Does not apply route in the morning, at noon, in the evening, and at bedtime.   LANTUS SOLOSTAR [place] Inject 14 Units into the skin at bedtime.   levothyroxine 150 MCG tablet Commonly known as: SYNTHROID TAKE 1 TABLET DAILY   lisinopril 2.5 MG tablet Commonly known as: ZESTRIL Take 1 tablet (2.5 mg total) by mouth daily. Take for blood pressure greater than 140/90 What changed: when to take this   metoprolol tartrate 25 MG tablet Commonly known as: LOPRESSOR Take 0.5 tablets (12.5 mg total) by mouth 2 (two) times daily.   omeprazole 40 MG capsule Commonly known as: PRILOSEC TAKE 1 CAPSULE TWICE A DAY   ondansetron 4 MG disintegrating tablet Commonly known as: Zofran ODT Take 1 tablet (4 mg total) by mouth every 8 (eight) hours as needed for nausea or vomiting.   pyridOXINE 100 MG tablet Commonly known as: VITAMIN B-6 Take 100 mg by mouth daily.   Vitamin  D 50 MCG (2000 UT) tablet Take 2,000 Units by mouth daily.         OBJECTIVE:   Vital Signs: BP 132/70 (BP Location: Right Arm, Patient Position: Sitting, Cuff Size: Small)   Pulse 61   Ht 5\' 2"  (1.575 m)   Wt 133 lb 9.6 oz (60.6 kg)   SpO2 92%   BMI 24.44 kg/m   Wt Readings from Last 3 Encounters:  12/12/20 133 lb 9.6 oz (60.6 kg)  12/07/20 133 lb (60.3 kg)  11/17/20 132 lb (59.9 kg)     Exam: General: Pt appears well and is in NAD  Lungs: Clear with good BS bilat   Heart: RRR   Extremities: No pretibial edema.   DM FOOT EXAM 09/21/2020 The skin of the feet is intact without sores or ulcerations. The pedal pulses are decreased. The sensation is absent  to a screening 5.07, 10 gram monofilament bilaterally    DATA REVIEWED:  Lab Results  Component Value Date   HGBA1C 9.1 (A) 12/12/2020   HGBA1C 9.4 (A) 09/21/2020   HGBA1C 8.6 (A) 03/16/2020   Lab Results  Component Value Date   MICROALBUR 1.8 03/09/2020   LDLCALC 92 11/17/2020   CREATININE 0.95 11/17/2020   Lab Results  Component Value Date   MICRALBCREAT 4.5 03/09/2020     Lab Results  Component Value Date   CHOL 168 11/17/2020   HDL 59 11/17/2020   LDLCALC 92 11/17/2020   TRIG 91 11/17/2020   CHOLHDL 2.8 11/17/2020        ASSESSMENT / PLAN / RECOMMENDATIONS:   1) Insulin-Dependent, Poorly controlled, With CKD III and neuropathic complications - Most recent A1c of 9.1 %. Goal A1c < 8.0%.    - In review of her CGM download she has been noted with hyperglycemia during the day and night for the past 2 weeks  - Pt is currently living with her daughter , daughter is nervous about hypoglycemia so she tends to not give  her prandial insulin with a late supper, she also adjusts basal insulin based on bedtime BG's or how much food she is going to eat etc  - We again discussed not adjusting basal insulin based on consumed meals, I have asked her not to adjust basal insulin up and down on daily basis as  this will affect glucose excursions  - We again discussed HUmalog is the prandial insulin and that could be reduced if she is going to eat less food then usual, Unfortunately there was an incident where the pt was given prandial insulin but the pt had a severe case of dysphagia and was unable to finish food which results in hypoglycemia the rest of that day, its ok to wait on the Humalog until the pt has completed her meal  - I have encouraged her to use the correction scale based on pre-meal glucose data rather then post meal  - She is on much higher dose of lantus then previously prescribed, I don;t see any evidence of hypoglycemia over night, so will continue for now  - At her advanced age the goal is to prevent hypoglycemia as well as severe hyperglycemia   MEDICATIONS:  Continue  Lantus 14 Units QHS Continue  Humalog 8 units with each meal  CF: Novolog ( BG- 130/60)  EDUCATION / INSTRUCTIONS: BG monitoring instructions: Patient is instructed to check her blood sugars 4 times a day, before meals and bedtime. Call [facility] if: BG persistently < 70  I reviewed the Rule of 15 for the treatment of hypoglycemia in detail with the patient. Literature supplied.    F/U in 4 months    Signed electronically by: Mack Guise, MD  [facility]  [address] [place],  [address] Phone: [phone] FAX: [phone]   CC: Burnard Hawthorne, FNP [address] [place] [address] Phone: [phone]  Fax: [phone]  Return to Endocrinology clinic as below: Future Appointments  Date Time Provider [place]  12/19/2020  3:10 PM Kathlen Mody, Cadence H, PA-C [facility]  01/11/2021  7:00 AM [facility] DEVICE REMOTES [facility]  01/18/2021 10:40 AM Vickie Epley, MD [facility]  01/25/2021  2:20 PM Melessa Cowell, Melanie Crazier, MD [facility] None   02/15/2021  1:30 PM Burnard Hawthorne, FNP [facility]  04/12/2021  7:00 AM [facility] DEVICE REMOTES [facility]  04/13/2021  1:00 PM Patrena Santalucia, Melanie Crazier, MD [facility] None  07/12/2021  7:00 AM [facility] DEVICE REMOTES [facility]  10/11/2021  7:00 AM [facility] DEVICE REMOTES [facility]  01/10/2022  7:00 AM [facility] DEVICE REMOTES [facility]  04/11/2022  7:00 AM [facility] DEVICE REMOTES [facility]

## 2020-12-12 NOTE — Patient Instructions (Addendum)
-   Continue  Lantus 14 Units bedtime ( This has nothing to do with eating )  -Continue Humalog 8 units with each meal  ( Needs this if she is going to eat regardless of the time ) - Humalog correctional insulin:  Use the scale below to help guide you: ( Can be added before the meal) or if you forget your meal dose of Novolog   Blood sugar before meal Number of units to inject  Less than 190 0 unit  191 -  250 1 units  251 -  310 2 units  311 -  370 3 units  371 -  430 4 units  431 -  490 5 units  491 -  550 6 units        HOW TO TREAT LOW BLOOD SUGARS (Blood sugar LESS THAN 70 MG/DL) Please follow the RULE OF 15 for the treatment of hypoglycemia treatment (when your (blood sugars are less than 70 mg/dL)   STEP 1: Take 15 grams of carbohydrates when your blood sugar is low, which includes:  3-4 GLUCOSE TABS  OR 3-4 OZ OF JUICE OR REGULAR SODA OR ONE TUBE OF GLUCOSE GEL    STEP 2: RECHECK blood sugar in 15 MINUTES STEP 3: If your blood sugar is still low at the 15 minute recheck --> then, go back to STEP 1 and treat AGAIN with another 15 grams of carbohydrates.

## 2020-12-13 ENCOUNTER — Ambulatory Visit (INDEPENDENT_AMBULATORY_CARE_PROVIDER_SITE_OTHER): Payer: Medicare HMO | Admitting: Adult Health

## 2020-12-13 ENCOUNTER — Encounter: Payer: Self-pay | Admitting: Adult Health

## 2020-12-13 VITALS — Ht 62.0 in

## 2020-12-13 DIAGNOSIS — R051 Acute cough: Secondary | ICD-10-CM

## 2020-12-13 DIAGNOSIS — J029 Acute pharyngitis, unspecified: Secondary | ICD-10-CM | POA: Diagnosis not present

## 2020-12-13 DIAGNOSIS — J069 Acute upper respiratory infection, unspecified: Secondary | ICD-10-CM

## 2020-12-13 MED ORDER — AZITHROMYCIN 250 MG PO TABS: ORAL_TABLET | ORAL | 0 refills | Status: DC

## 2020-12-13 MED ORDER — AZITHROMYCIN 250 MG PO TABS: ORAL_TABLET | ORAL | 0 refills | Status: AC

## 2020-12-13 MED ORDER — GUAIFENESIN-DM 100-10 MG/5ML PO SYRP: 10.0000 mL | ORAL_SOLUTION | ORAL | 0 refills | Status: DC | PRN

## 2020-12-13 NOTE — Progress Notes (Signed)
Virtual Visit via Telephone Note  I connected with Amy Calhoun on 12/13/20 at  2:30 PM EST by telephone and verified that I am speaking with the correct person using two identifiers.  Location: Patient: at home   Provider: Provider: Provider's office at  [facility], [place].      I discussed the limitations, risks, security and privacy concerns of performing an evaluation and management service by telephone and the availability of in person appointments. I also discussed with the patient that there may be a patient responsible charge related to this service. The patient expressed understanding and agreed to proceed.   History of Present Illness: Daughter Amy Calhoun on phone with patient.  Daughters husband had cold/ cough congestion last week that has cleared.  Patient reported to start with 12/09/20 with cough, nasal and chest congestion,non productive congestion. She just picked up Mucinex from pharmacy. Sore throat " hurts very bad".  Daughter denies any problem swallowing.  Patient has had cough now for 4 days. Denies any wheezing She denies any swelling.  Unable to find oxygen pulse ox. Daughter reports her color is good.  Patient's daughter is a retired Engineer, civil (consulting) and denies patient being in any distress. Patient  denies any fever, body aches,chills, rash, chest pain, shortness of breath, nausea, vomiting, or diarrhea.     Observations/Objective: No other vital signs available.   Patient is alert and oriented and responsive to daughter who is on phone.   Assessment and Plan: Acute cough - Plan: azithromycin (ZITHROMAX Z-PAK) 250 MG tablet, DG Chest 2 View  Viral upper respiratory tract infection - Plan: guaiFENesin-dextromethorphan (ROBITUSSIN DM) 100-10 MG/5ML syrup  Sore throat - Plan: azithromycin (ZITHROMAX Z-PAK) 250 MG tablet  Recommend COVID flu and RSV testing here in the office, patient's daughter politely declines testing and would like  symptom management.  Given patient's age and comorbidities we will send an medication as above.  Discussed red flags when to seek emergency medical treatment immediately if patient is worsening. Chest x-ray was ordered at [facility] walk[facility]in medical mall. Daughter does not desire any antiviral medications/and declines testing.  Hydration discussed.  She will switch to guaifenesin dextromethorphan as prescribed. Follow Up Instructions:   Return in about 3 days (around 12/16/2020), or if symptoms worsen or fail to improve, for at any time for any worsening symptoms, Go to Emergency room/ urgent care if worse.  I discussed the assessment and treatment plan with the patient. The patient was provided an opportunity to ask questions and all were answered. The patient agreed with the plan and demonstrated an understanding of the instructions.   The patient was advised to call back or seek an in-person evaluation if the symptoms worsen or if the condition fails to improve as anticipated.  I provided 20 minutes of non-face-to-face time during this encounter.   Jairo Ben, FNP

## 2020-12-13 NOTE — Addendum Note (Signed)
Addended by: Berniece Pap on: 12/13/2020 05:02 PM   Modules accepted: Orders

## 2020-12-13 NOTE — Patient Instructions (Signed)
Pharyngitis Pharyngitis is inflammation of the throat (pharynx). It is a very common cause of sore throat. Pharyngitis can be caused by a bacteria, but it is usually caused by a virus. Most cases of pharyngitis get better on their own without treatment. What are the causes? This condition may be caused by: Infection by viruses (viral). Viral pharyngitis spreads easily from person to person (is contagious) through coughing, sneezing, and sharing of personal items or utensils such as cups, forks, spoons, and toothbrushes. Infection by bacteria (bacterial). Bacterial pharyngitis may be spread by touching the nose or face after coming in contact with the bacteria, or through close contact, such as kissing. Allergies. Allergies can cause buildup of mucus in the throat (post-nasal drip), leading to inflammation and irritation. Allergies can also cause blocked nasal passages, forcing breathing through the mouth, which dries and irritates the throat. What increases the risk? You are more likely to develop this condition if: You are 36-35 years old. You are exposed to crowded environments such as daycare, school, or dormitory living. You live in a cold climate. You have a weakened disease-fighting (immune) system. What are the signs or symptoms? Symptoms of this condition vary by the cause. Common symptoms of this condition include: Sore throat. Fatigue. Low-grade fever. Stuffy nose (nasal congestion) and cough. Headache. Other symptoms may include: Glands in the neck (lymph nodes) that are swollen. Skin rashes. Plaque-like film on the throat or tonsils. This is often a symptom of bacterial pharyngitis. Vomiting. Red, itchy eyes (conjunctivitis). Loss of appetite. Joint pain and muscle aches. Enlarged tonsils. How is this diagnosed? This condition may be diagnosed based on your medical history and a physical exam. Your health care provider will ask you questions about your illness and your  symptoms. A swab of your throat may be done to check for bacteria (rapid strep test). Other lab tests may also be done, depending on the suspected cause, but these are rare. How is this treated? Many times, treatment is not needed for this condition. Pharyngitis usually gets better in 3-4 days without treatment. Bacterial pharyngitis may be treated with antibiotic medicines. Follow these instructions at home: Medicines Take over-the-counter and prescription medicines only as told by your health care provider. If you were prescribed an antibiotic medicine, take it as told by your health care provider. Do not stop taking the antibiotic even if you start to feel better. Use throat sprays to soothe your throat as told by your health care provider. Children can get pharyngitis. Do not give your child aspirin because of the association with Reye's syndrome. Managing pain To help with pain, try: Sipping warm liquids, such as broth, herbal tea, or warm water. Eating or drinking cold or frozen liquids, such as frozen ice pops. Gargling with a mixture of salt and water 3-4 times a day or as needed. To make salt water, completely dissolve -1 tsp (3-6 g) of salt in 1 cup (237 mL) of warm water. Sucking on hard candy or throat lozenges. Putting a cool-mist humidifier in your bedroom at night to moisten the air. Sitting in the bathroom with the door closed for 5-10 minutes while you run hot water in the shower.  General instructions  Do not use any products that contain nicotine or tobacco. These products include cigarettes, chewing tobacco, and vaping devices, such as e-cigarettes. If you need help quitting, ask your health care provider. Rest as told by your health care provider. Drink enough fluid to keep your urine pale yellow. How  is this prevented? To help prevent becoming infected or spreading infection: Wash your hands often with soap and water for at least 20 seconds. If soap and water are not  available, use hand sanitizer. Do not touch your eyes, nose, or mouth with unwashed hands, and wash hands after touching these areas. Do not share cups or eating utensils. Avoid close contact with people who are sick. Contact a health care provider if: You have large, tender lumps in your neck. You have a rash. You cough up green, yellow-brown, or bloody mucus. Get help right away if: Your neck becomes stiff. You drool or are unable to swallow liquids. You cannot drink or take medicines without vomiting. You have severe pain that does not go away, even after you take medicine. You have trouble breathing, and it is not caused by a stuffy nose. You have new pain and swelling in your joints such as the knees, ankles, wrists, or elbows. These symptoms may represent a serious problem that is an emergency. Do not wait to see if the symptoms will go away. Get medical help right away. Call your local emergency services (911 in the [place] Do not drive yourself to the hospital. Summary Pharyngitis is redness, pain, and swelling (inflammation) of the throat (pharynx). While pharyngitis can be caused by a bacteria, the most common causes are viral. Most cases of pharyngitis get better on their own without treatment. Bacterial pharyngitis is treated with antibiotic medicines. This information is not intended to replace advice given to you by your health care provider. Make sure you discuss any questions you have with your health care provider. Document Revised: 03/30/2020 Document Reviewed: 03/30/2020 Elsevier Patient Education  [place]. Cough, Adult A cough helps to clear your throat and lungs. A cough may be a sign of an illness or another medical condition. An acute cough may only last 2-3 weeks, while a chronic cough may last 8 or more weeks. Many things can cause a cough. They include: Germs (viruses or bacteria) that attack the airway. Breathing in things that bother (irritate) your  lungs. Allergies. Asthma. Mucus that runs down the back of your throat (postnasal drip). Smoking. Acid backing up from the stomach into the tube that moves food from the mouth to the stomach (gastroesophageal reflux). Some medicines. Lung problems. Other medical conditions, such as heart failure or a blood clot in the lung (pulmonary embolism). Follow these instructions at home: Medicines Take over-the-counter and prescription medicines only as told by your doctor. Talk with your doctor before you take medicines that stop a cough (cough suppressants). Lifestyle  Do not smoke, and try not to be around smoke. Do not use any products that contain nicotine or tobacco, such as cigarettes, e-cigarettes, and chewing tobacco. If you need help quitting, ask your doctor. Drink enough fluid to keep your pee (urine) pale yellow. Avoid caffeine. Do not drink alcohol if your doctor tells you not to drink. General instructions  Watch for any changes in your cough. Tell your doctor about them. Always cover your mouth when you cough. Stay away from things that make you cough, such as perfume, candles, campfire smoke, or cleaning products. If the air is dry, use a cool mist vaporizer or humidifier in your home. If your cough is worse at night, try using extra pillows to raise your head up higher while you sleep. Rest as needed. Keep all follow-up visits as told by your doctor. This is important. Contact a doctor if: You have  new symptoms. You cough up pus. Your cough does not get better after 2-3 weeks, or your cough gets worse. Cough medicine does not help your cough and you are not sleeping well. You have pain that gets worse or pain that is not helped with medicine. You have a fever. You are losing weight and you do not know why. You have night sweats. Get help right away if: You cough up blood. You have trouble breathing. Your heartbeat is very fast. These symptoms may be an emergency. Do  not wait to see if the symptoms will go away. Get medical help right away. Call your local emergency services (911 in the [place] Do not drive yourself to the hospital. Summary A cough helps to clear your throat and lungs. Many things can cause a cough. Take over-the-counter and prescription medicines only as told by your doctor. Always cover your mouth when you cough. Contact a doctor if you have new symptoms or you have a cough that does not get better or gets worse. This information is not intended to replace advice given to you by your health care provider. Make sure you discuss any questions you have with your health care provider. Document Revised: 02/20/2019 Document Reviewed: 01/20/2018 Elsevier Patient Education  2022 [employer]. Upper Respiratory Infection, Adult An upper respiratory infection (URI) affects the nose, throat, and upper airways that lead to the lungs. The most common type of URI is often called the common cold. URIs usually get better on their own, without medical treatment. What are the causes? A URI is caused by a germ (virus). You may catch these germs by: Breathing in droplets from an infected person's cough or sneeze. Touching something that has the germ on it (is contaminated) and then touching your mouth, nose, or eyes. What increases the risk? You are more likely to get a URI if: You are very young or very old. You have close contact with others, such as at work, school, or a health care facility. You smoke. You have long-term (chronic) heart or lung disease. You have a weakened disease-fighting system (immune system). You have nasal allergies or asthma. You have a lot of stress. You have poor nutrition. What are the signs or symptoms? Runny or stuffy (congested) nose. Cough. Sneezing. Sore throat. Headache. Feeling tired (fatigue). Fever. Not wanting to eat as much as usual. Pain in your forehead, behind your eyes, and over your cheekbones (sinus  pain). Muscle aches. Redness or irritation of the eyes. Pressure in the ears or face. How is this treated? URIs usually get better on their own within 7-10 days. Medicines cannot cure URIs, but your doctor may recommend certain medicines to help relieve symptoms, such as: Over-the-counter cold medicines. Medicines to reduce coughing (cough suppressants). Coughing is a type of defense against infection that helps to clear the nose, throat, windpipe, and lungs (respiratory system). Take these medicines only as told by your doctor. Medicines to lower your fever. Follow these instructions at home: Activity Rest as needed. If you have a fever, stay home from work or school until your fever is gone, or until your doctor says you may return to work or school. You should stay home until you cannot spread the infection anymore (you are not contagious). Your doctor may have you wear a face mask so you have less risk of spreading the infection. Relieving symptoms Rinse your mouth often with salt water. To make salt water, dissolve -1 tsp (3-6 g) of salt in 1  cup (237 mL) of warm water. Use a cool-mist humidifier to add moisture to the air. This can help you breathe more easily. Eating and drinking  Drink enough fluid to keep your pee (urine) pale yellow. Eat soups and other clear broths. General instructions  Take over-the-counter and prescription medicines only as told by your doctor. Do not smoke or use any products that contain nicotine or tobacco. If you need help quitting, ask your doctor. Avoid being where people are smoking (avoid secondhand smoke). Stay up to date on all your shots (immunizations), and get the flu shot every year. Keep all follow-up visits. How to prevent the spread of infection to others  Wash your hands with soap and water for at least 20 seconds. If you cannot use soap and water, use hand sanitizer. Avoid touching your mouth, face, eyes, or nose. Cough or sneeze  into a tissue or your sleeve or elbow. Do not cough or sneeze into your hand or into the air. Contact a doctor if: You are getting worse, not better. You have any of these: A fever or chills. Brown or red mucus in your nose. Yellow or brown fluid (discharge)coming from your nose. Pain in your face, especially when you bend forward. Swollen neck glands. Pain when you swallow. White areas in the back of your throat. Get help right away if: You have shortness of breath that gets worse. You have very bad or constant: Headache. Ear pain. Pain in your forehead, behind your eyes, and over your cheekbones (sinus pain). Chest pain. You have long-lasting (chronic) lung disease along with any of these: Making high-pitched whistling sounds when you breathe, most often when you breathe out (wheezing). Long-lasting cough (more than 14 days). Coughing up blood. A change in your usual mucus. You have a stiff neck. You have changes in your: Vision. Hearing. Thinking. Mood. These symptoms may be an emergency. Get help right away. Call 911. Do not wait to see if the symptoms will go away. Do not drive yourself to the hospital. Summary An upper respiratory infection (URI) is caused by a germ (virus). The most common type of URI is often called the common cold. URIs usually get better within 7-10 days. Take over-the-counter and prescription medicines only as told by your doctor. This information is not intended to replace advice given to you by your health care provider. Make sure you discuss any questions you have with your health care provider. Document Revised: 08/03/2020 Document Reviewed: 08/03/2020 Elsevier Patient Education  [place].

## 2020-12-15 ENCOUNTER — Telehealth: Payer: Self-pay

## 2020-12-15 NOTE — Chronic Care Management (AMB) (Signed)
  Chronic Care Management   Note  12/15/2020 Name: Jace Dowe MRN: [id number] DOB: Oct 10, 1928  Laurella Mae Albergo is a 87 y.o. year old female who is a primary care patient of Burnard Hawthorne, FNP. I reached out to [facility] [employer] by phone today in response to a referral sent by Ms. Alexis Schnider's PCP.  Ms. Supan was given information about Chronic Care Management services today including:  CCM service includes personalized support from designated clinical staff supervised by her physician, including individualized plan of care and coordination with other care providers 24/7 contact phone numbers for assistance for urgent and routine care needs. Service will only be billed when office clinical staff spend 20 minutes or more in a month to coordinate care. Only one practitioner may furnish and bill the service in a calendar month. The patient may stop CCM services at any time (effective at the end of the month) by phone call to the office staff. The patient is responsible for co-pay (up to 20% after annual deductible is met) if co-pay is required by the individual health plan.   Patient's daughter Jaquita Folds  agreed to services and verbal consent obtained.   Follow up plan: Telephone appointment with care management team member scheduled for:12/22/2020  Noreene Larsson, [address], [place], [place] [address] Direct Dial: [phone] [email or web address]: [email or web address]

## 2020-12-19 ENCOUNTER — Other Ambulatory Visit: Payer: Self-pay

## 2020-12-19 ENCOUNTER — Other Ambulatory Visit
Admission: RE | Admit: 2020-12-19 | Discharge: 2020-12-19 | Disposition: A | Discharge status: Home / Self Care | Payer: Medicare HMO | Source: Home / Self Care | Attending: Medical | Admitting: Medical

## 2020-12-19 ENCOUNTER — Ambulatory Visit
Admission: RE | Admit: 2020-12-19 | Discharge: 2020-12-19 | Disposition: A | Discharge status: Home / Self Care | Payer: Medicare HMO | Attending: Medical | Admitting: Medical

## 2020-12-19 ENCOUNTER — Encounter: Payer: Self-pay | Admitting: Medical

## 2020-12-19 ENCOUNTER — Ambulatory Visit
Admission: RE | Admit: 2020-12-19 | Discharge: 2020-12-19 | Disposition: A | Discharge status: Home / Self Care | Payer: Medicare HMO | Source: Ambulatory Visit | Attending: Medical | Admitting: Medical

## 2020-12-19 ENCOUNTER — Ambulatory Visit (INDEPENDENT_AMBULATORY_CARE_PROVIDER_SITE_OTHER): Payer: Medicare HMO | Admitting: Medical

## 2020-12-19 VITALS — BP 168/74 | HR 64 | Ht 64.0 in | Wt 134.0 lb

## 2020-12-19 DIAGNOSIS — I442 Atrioventricular block, complete: Secondary | ICD-10-CM

## 2020-12-19 DIAGNOSIS — R0989 Other specified symptoms and signs involving the circulatory and respiratory systems: Secondary | ICD-10-CM

## 2020-12-19 DIAGNOSIS — I2510 Atherosclerotic heart disease of native coronary artery without angina pectoris: Secondary | ICD-10-CM | POA: Diagnosis present

## 2020-12-19 DIAGNOSIS — I10 Essential (primary) hypertension: Secondary | ICD-10-CM | POA: Insufficient documentation

## 2020-12-19 DIAGNOSIS — R0602 Shortness of breath: Secondary | ICD-10-CM | POA: Diagnosis present

## 2020-12-19 DIAGNOSIS — E1169 Type 2 diabetes mellitus with other specified complication: Secondary | ICD-10-CM

## 2020-12-19 DIAGNOSIS — E785 Hyperlipidemia, unspecified: Secondary | ICD-10-CM

## 2020-12-19 LAB — CBC WITH DIFFERENTIAL/PLATELET
Abs Immature Granulocytes: 0.06 10*3/uL (ref 0.00–0.07)
Basophils Absolute: 0 10*3/uL (ref 0.0–0.1)
Basophils Relative: 0 %
Eosinophils Absolute: 0.2 10*3/uL (ref 0.0–0.5)
Eosinophils Relative: 3 %
HCT: 32.6 % — ABNORMAL LOW (ref 36.0–46.0)
Hemoglobin: 10.1 g/dL — ABNORMAL LOW (ref 12.0–15.0)
Immature Granulocytes: 1 %
Lymphocytes Relative: 21 %
Lymphs Abs: 1.7 10*3/uL (ref 0.7–4.0)
MCH: 28.9 pg (ref 26.0–34.0)
MCHC: 31 g/dL (ref 30.0–36.0)
MCV: 93.1 fL (ref 80.0–100.0)
Monocytes Absolute: 0.7 10*3/uL (ref 0.1–1.0)
Monocytes Relative: 8 %
Neutro Abs: 5.6 10*3/uL (ref 1.7–7.7)
Neutrophils Relative %: 67 %
Platelets: 335 10*3/uL (ref 150–400)
RBC: 3.5 MIL/uL — ABNORMAL LOW (ref 3.87–5.11)
RDW: 12.9 % (ref 11.5–15.5)
WBC: 8.3 10*3/uL (ref 4.0–10.5)
nRBC: 0 % (ref 0.0–0.2)

## 2020-12-19 LAB — BASIC METABOLIC PANEL
Anion gap: 9 (ref 5–15)
BUN: 35 mg/dL — ABNORMAL HIGH (ref 8–23)
CO2: 23 mmol/L (ref 22–32)
Calcium: 8.3 mg/dL — ABNORMAL LOW (ref 8.9–10.3)
Chloride: 103 mmol/L (ref 98–111)
Creatinine, Ser: 0.91 mg/dL (ref 0.44–1.00)
GFR, Estimated: 60 mL/min — ABNORMAL LOW (ref >60)
Glucose, Bld: 236 mg/dL — ABNORMAL HIGH (ref 70–99)
Potassium: 5.3 mmol/L — ABNORMAL HIGH (ref 3.5–5.1)
Sodium: 135 mmol/L (ref 135–145)

## 2020-12-19 LAB — BRAIN NATRIURETIC PEPTIDE: B Natriuretic Peptide: 459.9 pg/mL — ABNORMAL HIGH (ref 0.0–100.0)

## 2020-12-19 NOTE — Patient Instructions (Signed)
Medication Instructions:  - Your physician recommends that you continue on your current medications as directed. Please refer to the Current Medication list given to you today.  *If you need a refill on your cardiac medications before your next appointment, please call your pharmacy*   Lab Work: 1) Your physician recommends that you have lab work today: BMP/ CBC/ BNP  [facility] 1st desk on the right (REGISTRATION) to check in.     2) Your physician recommends that you return for FASTING lab work in: 6-8 weeks (just prior to your follow up appointment with Dr. Okey Dupre)- Lipid panel   If you have labs (blood work) drawn today and your tests are completely normal, you will receive your results only by: MyChart Message (if you have MyChart) OR A paper copy in the mail If you have any lab test that is abnormal or we need to change your treatment, we will call you to review the results.   Testing/Procedures: 1) Chest X-ray: today - A chest x-ray takes a picture of the organs and structures inside the chest, including the heart, lungs, and blood vessels. This test can show several things, including, whether the heart is enlarges; whether fluid is building up in the lungs; and whether pacemaker / defibrillator leads are still in place.  [facility] 1st desk on the right (REGISTRATION) to check in.    Follow-Up: At [facility], you and your health needs are our priority.  As part of our continuing mission to provide you with exceptional heart care, we have created designated Provider Care Teams.  These Care Teams include your primary Cardiologist (physician) and Advanced Practice Providers (APPs -  Physician Assistants and Nurse Practitioners) who all work together to provide you with the care you need, when you need it.  We recommend signing up for the patient portal called "MyChart".  Sign up information is provided on this After Visit Summary.  MyChart is  used to connect with patients for Virtual Visits (Telemedicine).  Patients are able to view lab/test results, encounter notes, upcoming appointments, etc.  Non-urgent messages can be sent to your provider as well.   To learn more about what you can do with MyChart, go to [email or web address].    Your next appointment:   2 month(s)  The format for your next appointment:   In Person  Provider:   Yvonne Kendall, MD    Other Instructions N/a

## 2020-12-19 NOTE — Progress Notes (Signed)
Cardiology Office Note:    Date:  12/19/2020   ID:  Amy Calhoun, DOB [birth year], MRN [id number]  PCP:  Burnard Hawthorne, FNP  [facility] Cardiologist:[place] Electrophysiologist:  None   Referring MD: Burnard Hawthorne, FNP   Chief Complaint: 1 month f/u  History of Present Illness:    Amy Calhoun is a 87 y.o. female with a hx of HTN, HLD, DM2 complicated by peripheral neuropathy, orthostatic hypotension, hypothyroidism, and GERD who presents for follow-up.  She underwent cardiac catheterization 10/11/20 and temporary transvenous pacemaker placement.  Catheterization showed severe single-vessel CAD involving a relatively small right coronary artery.  Permanent pacemaker was placed the following day by Dr. Quentin Ore.  Hospital course was complicated by acute kidney injury treated with aggressive hydration leading to iatrogenic volume overload and heart failure in the setting of preserved LVEF.  She was initially discharged home with supplemental oxygen but readmitted to the ED due to multiple falls.  She is now in rehab and has been told that she will be unable to return to her previous residence at an independent living facility.  She was last seen 11/17/20 and she felt well. No further syncopal episodes. Discussed addition of statin.   Today, discussed the addition of statin, however not wanting to start at this time. They will revisit with Dr. Saunders Revel at the next visit. They want Zetia more time to work, they are requesting repeat labs prior to the next visit with Dr. Aura Dials. She trying to maintain diabetic diet. Also eating low salt diet and elevating legs.Daughter reporting leg weakness as well as generalized weakness, some dizziness, and overall feeling poorly. Daughter feels all the patients symptoms are from the new mediations and has self-decreased and self-dosed the meds. She is taking lasix 10mg  every other day. She is taking lisinopril 2.5mg  daily. She takes lopressor  12.5mg  on the AM and 25mg  at the bedtime. Patient had a recent  viral illness, she was on a zpack. This was finished on Saturday. She is taking decongestant and mucinex. Still has lingering cough.   Past Medical History:  Diagnosis Date   Allergy    Ambulates with cane    Anemia    At high risk for falls    Cataract    removed both eyes   Chronic kidney disease 08/07/2016   Chronic renal failure, stage III   Depression    Esophageal dysphagia    Gastric ulcer    GERD (gastroesophageal reflux disease)    Hyperlipidemia    Hypertension    Hypothyroidism    IDDM (insulin dependent diabetes mellitus)    Ischemic colitis ([place])    Neuromuscular disorder ([place])    neuropathy   Osteoarthritis    Ulcer of esophagus     Past Surgical History:  Procedure Laterality Date   ABDOMINAL HYSTERECTOMY     CATARACT EXTRACTION, BILATERAL     CHOLECYSTECTOMY     COLONOSCOPY  2010   ESOPHAGEAL DILATION  08/07/2016   usually a couple times a year, last time 07/30/16   GALLBLADDER SURGERY     LEFT HEART CATH AND CORONARY ANGIOGRAPHY N/A 10/11/2020   Procedure: LEFT HEART CATH AND CORONARY ANGIOGRAPHY;  Surgeon: Nelva Bush, MD;  Location: [facility];  Service: Cardiovascular;  Laterality: N/A;   LUMBAR LAMINECTOMY/DECOMPRESSION MICRODISCECTOMY N/A 08/10/2016   Procedure: BILATERAL HEMILAMINECTOMY LUMBAR THREE-FOUR,LUMBAR FOUR-FIVE,LEFT LUMBAR FIVE-SACRAL ONE HEMILAMINECTOMY AND DECOMPRESSION;  Surgeon: Ashok Pall, MD;  Location: [facility];  Service: Neurosurgery;  Laterality: N/A;   PACEMAKER IMPLANT N/A 10/12/2020   Procedure: PACEMAKER IMPLANT;  Surgeon: Lanier Prude, MD;  Location: [facility];  Service: Cardiovascular;  Laterality: N/A;   TEMPORARY PACEMAKER N/A 10/11/2020   Procedure: TEMPORARY PACEMAKER;  Surgeon: Yvonne Kendall, MD;  Location: [facility];  Service: Cardiovascular;  Laterality: N/A;   THYROIDECTOMY     UPPER GASTROINTESTINAL ENDOSCOPY       Current Medications: Current Meds  Medication Sig   Acetaminophen-Codeine 300-30 MG tablet Take 1 tablet by mouth every 12 (twelve) hours as needed for pain (moderate to severe pain).   albuterol (PROVENTIL HFA;VENTOLIN HFA) 108 (90 Base) MCG/ACT inhaler Inhale 2 puffs into the lungs every 6 (six) hours as needed for wheezing or shortness of breath.   amitriptyline (ELAVIL) 100 MG tablet Take 1 tablet (100 mg total) by mouth at bedtime.   ascorbic acid (VITAMIN C) 500 MG tablet Take 500 mg by mouth daily.   Cholecalciferol (VITAMIN D) 2000 units tablet Take 2,000 Units by mouth daily.    Continuous Blood Gluc Sensor (FREESTYLE LIBRE 14 DAY SENSOR) MISC 1 Package by Does not apply route every 14 (fourteen) days.   Cyanocobalamin (DODEX IJ) Inject as directed every 30 (thirty) days.   docusate sodium (COLACE) 100 MG capsule Take 1 capsule (100 mg total) by mouth 2 (two) times daily.   ezetimibe (ZETIA) 10 MG tablet Take 1 tablet (10 mg total) by mouth daily.   ferrous sulfate 324 MG TBEC Take 324 mg by mouth every other day.   furosemide (LASIX) 20 MG tablet Take 10 mg by mouth. Every other day PRN   gabapentin (NEURONTIN) 100 MG capsule Take 2 capsules (200 mg total) by mouth 3 (three) times daily.   glucagon 1 MG injection Inject 1 mg into the muscle once as needed for up to 1 dose. Hypoglycemia If she is not tolerating PO.   Insulin Glargine (LANTUS SOLOSTAR [place]) Inject 14 Units into the skin at bedtime.   insulin lispro (HUMALOG KWIKPEN) 100 UNIT/ML KwikPen Inject 8 Units into the skin 3 (three) times daily. Max Daily 30 units   Insulin Pen Needle 32G X 4 MM MISC 1 Device by Does not apply route in the morning, at noon, in the evening, and at bedtime.   levothyroxine (SYNTHROID) 150 MCG tablet TAKE 1 TABLET DAILY   lisinopril (ZESTRIL) 2.5 MG tablet Take 2.5 mg by mouth every other day.   metoprolol tartrate (LOPRESSOR) 25 MG tablet Take 25 mg by mouth. 1/2 tablet in AM and 1 tablet in  the evening   Misc. Devices (BARIATRIC ROLLATOR) MISC Use as needed   omeprazole (PRILOSEC) 40 MG capsule TAKE 1 CAPSULE TWICE A DAY   ondansetron (ZOFRAN ODT) 4 MG disintegrating tablet Take 1 tablet (4 mg total) by mouth every 8 (eight) hours as needed for nausea or vomiting.   pyridOXINE (VITAMIN B-6) 100 MG tablet Take 100 mg by mouth daily.     Allergies:   Pravachol [pravastatin]   Social History   Socioeconomic History   Marital status: Widowed    Spouse name: Not on file   Number of children: Not on file   Years of education: Not on file   Highest education level: Not on file  Occupational History   Occupation: retired  Tobacco Use   Smoking status: Never   Smokeless tobacco: Never  Vaping Use   Vaping Use: Never used  Substance and Sexual Activity   Alcohol  use: No   Drug use: No   Sexual activity: Not Currently    Partners: Male  Other Topics Concern   Not on file  Social History Narrative   [facility] senior complex, independent living.    Provide her with cleaning, one meal per day.    Social Determinants of Health   Financial Resource Strain: Low Risk    Difficulty of Paying Living Expenses: Not hard at all  Food Insecurity: No Food Insecurity   Worried About Charity fundraiser in the Last Year: Never true   [place] in the Last Year: Never true  Transportation Needs: No Transportation Needs   Lack of Transportation (Medical): No   Lack of Transportation (Non-Medical): No  Physical Activity: Sufficiently Active   Days of Exercise per Week: 3 days   Minutes of Exercise per Session: 50 min  Stress: No Stress Concern Present   Feeling of Stress : Not at all  Social Connections: Unknown   Frequency of Communication with Friends and Family: Not on file   Frequency of Social Gatherings with Friends and Family: More than three times a week   Attends Religious Services: Not on Electrical engineer or Organizations: Not on file   Attends English as a second language teacher Meetings: Not on file   Marital Status: Not on file     Family History: The patient's family history includes Arthritis in her maternal grandfather, maternal grandmother, and mother; Diabetes in her mother; Heart disease in her father; Hyperlipidemia in her mother; Mental illness in her mother and sister. There is no history of Colon cancer, Stomach cancer, Esophageal cancer, Rectal cancer, or Colon polyps.  ROS:   Please see the history of present illness.     All other systems reviewed and are negative.  EKGs/Labs/Other Studies Reviewed:    The following studies were reviewed today:  Cardiac cath 10/11/20 Conclusions: Severe single-vessel coronary artery disease with diffuse disease involving relatively small RCA extending from the ostium through the mid/distal vessel of up to 90%.  Mild-moderate disease is also noted in the left coronary artery. Mildly elevated left ventricular filling pressure (LVEDP 15-20 mmHg). Successful placement of 9F temporary transvenous pacing wire via the right internal jugular vein.   Recommendations: Recommend medical therapy of relatively small and diffusely diseased RCA, which already fills via faint left-right collaterals to the PDA. Secondary prevention of coronary artery disease. STAT portable chest radiograph when patient arrives in ICU for evaluation of temporary transvenous pacemaker placement. EP consultation with plans for permanent pacemaker tomorrow morning with Dr. Quentin Ore.   Nelva Bush, MD [facility]  Echo 10/11/20  1. Left ventricular ejection fraction, by estimation, is 60 to 65%. The  left ventricle has normal function. Left ventricular endocardial border  not optimally defined to evaluate regional wall motion. There is mild left  ventricular hypertrophy. Left  ventricular diastolic parameters are consistent with Grade I diastolic  dysfunction (impaired relaxation). Elevated left atrial pressure.   2. Right  ventricular systolic function is normal. The right ventricular  size is normal. Tricuspid regurgitation signal is inadequate for assessing  PA pressure.   3. Left atrial size was mildly dilated.   4. The mitral valve is degenerative. Trivial mitral valve regurgitation.  No evidence of mitral stenosis. Moderate to severe mitral annular  calcification.   5. The aortic valve is tricuspid. There is mild calcification of the  aortic valve. There is mild thickening of the aortic valve.  Aortic valve  regurgitation is mild. Mild to moderate aortic valve  sclerosis/calcification is present, without any evidence  of aortic stenosis.   EKG:  EKG is not ordered today.    Recent Labs: 10/15/2020: B Natriuretic Peptide 705.3 10/17/2020: Magnesium 1.9 11/05/2020: TSH 5.251 11/07/2020: Hemoglobin 11.9; Platelets 218 11/17/2020: ALT 13; BUN 16; Creatinine, Ser 0.95; Potassium 4.7; Sodium 141  Recent Lipid Panel    Component Value Date/Time   CHOL 168 11/17/2020 1427   TRIG 91 11/17/2020 1427   HDL 59 11/17/2020 1427   CHOLHDL 2.8 11/17/2020 1427   CHOLHDL 2 10/14/2019 1425   VLDL 17.8 10/14/2019 1425   LDLCALC 92 11/17/2020 1427    Physical Exam:    VS:  BP (!) 168/74 (BP Location: Right Arm, Patient Position: Sitting, Cuff Size: Normal)   Pulse 64   Ht 5\' 4"  (1.626 m)   Wt 134 lb (60.8 kg)   BMI 23.00 kg/m     Wt Readings from Last 3 Encounters:  12/19/20 134 lb (60.8 kg)  12/12/20 133 lb 9.6 oz (60.6 kg)  12/07/20 133 lb (60.3 kg)     GEN:  Well nourished, well developed in no acute distress HEENT: Normal NECK: No JVD; No carotid bruits LYMPHATICS: No lymphadenopathy CARDIAC: RRR, no murmurs, rubs, gallops RESPIRATORY:  crackles at bases ABDOMEN: Soft, non-tender, non-distended MUSCULOSKELETAL:  Trace edema; No deformity  SKIN: Warm and dry NEUROLOGIC:  Alert and oriented x 3 PSYCHIATRIC:  Normal affect   ASSESSMENT:    1. Shortness of breath   2. Lung crackles   3.  Heart block AV complete ([place])   4. Coronary artery disease involving native coronary artery of native heart without angina pectoris   5. Essential hypertension   6. Hyperlipidemia associated with type 2 diabetes mellitus ([place])    PLAN:    In order of problems listed above:  Complete heart block s/p PPM Following with EP for pacemaker   CAD No chest pain reported. Prior cath showed severe disease involving the RCA as well as mild to moderate left coronary disease. Plan to continue medical therapy. Trying to convince family to start statin, however wanting to wait to speak with Dr. Saunders Revel. Continue ASA and Lopressor. No further ischemic work-up at this time.   HTN BP high, however has not had medications. BP at home reportedly 120s/60s. She takes lisinopril 2.5mg  daily and Lopressor 12.5mg  daily in the AM and 25mg  in the PM.   Recent viral illness Reports recent respiratory infection, now with lingering cough. She finished a Z-Pak per PCP. Crackles on exam. CXR today. Labs today. Continue Lasix for now. Will also check CBC, BMET and BNP  HLD Re-check of lipids showed LDL 92, goal <70. Patient's family member not wanting to start a statin just yet, would like to continue Zetia for now. Will order fasting lipid panel for 6-8 weeks. She will follow-up with Dr. Saunders Revel to further discuss statin.   HFpEF Echo showed LVEF 60-65%, mild LVH and G1DD. She is on lasix 10mg  every other day. Trace edema on exam an crackles at bases. CHF education discussed. BNP today a long with CXR as above. Continue Lopressor and lisinopril.    Disposition: Follow up in 2 month(s) with MD      Signed, Divine Hansley Ninfa Meeker, PA-C  12/19/2020 3:37 PM    [facility]

## 2020-12-21 ENCOUNTER — Telehealth: Payer: Self-pay

## 2020-12-21 ENCOUNTER — Telehealth (INDEPENDENT_AMBULATORY_CARE_PROVIDER_SITE_OTHER): Payer: Medicare HMO | Admitting: Family

## 2020-12-21 ENCOUNTER — Telehealth: Payer: Self-pay | Admitting: Family

## 2020-12-21 ENCOUNTER — Encounter: Payer: Self-pay | Admitting: Family

## 2020-12-21 ENCOUNTER — Other Ambulatory Visit: Payer: Self-pay

## 2020-12-21 VITALS — Ht 64.0 in

## 2020-12-21 DIAGNOSIS — J40 Bronchitis, not specified as acute or chronic: Secondary | ICD-10-CM

## 2020-12-21 DIAGNOSIS — D649 Anemia, unspecified: Secondary | ICD-10-CM

## 2020-12-21 DIAGNOSIS — L89156 Pressure-induced deep tissue damage of sacral region: Secondary | ICD-10-CM | POA: Diagnosis not present

## 2020-12-21 DIAGNOSIS — L8990 Pressure ulcer of unspecified site, unspecified stage: Secondary | ICD-10-CM | POA: Insufficient documentation

## 2020-12-21 DIAGNOSIS — R1310 Dysphagia, unspecified: Secondary | ICD-10-CM

## 2020-12-21 DIAGNOSIS — S91302A Unspecified open wound, left foot, initial encounter: Secondary | ICD-10-CM | POA: Diagnosis not present

## 2020-12-21 DIAGNOSIS — K224 Dyskinesia of esophagus: Secondary | ICD-10-CM

## 2020-12-21 DIAGNOSIS — S91309A Unspecified open wound, unspecified foot, initial encounter: Secondary | ICD-10-CM

## 2020-12-21 DIAGNOSIS — I10 Essential (primary) hypertension: Secondary | ICD-10-CM

## 2020-12-21 MED ORDER — ALBUTEROL SULFATE (2.5 MG/3ML) 0.083% IN NEBU: 2.5000 mg | INHALATION_SOLUTION | Freq: Four times a day (QID) | RESPIRATORY_TRACT | 1 refills | Status: DC | PRN

## 2020-12-21 MED ORDER — FLUTICASONE-SALMETEROL 100-50 MCG/ACT IN AEPB: 1.0000 | INHALATION_SPRAY | Freq: Two times a day (BID) | RESPIRATORY_TRACT | 3 refills | Status: DC

## 2020-12-21 MED ORDER — BUDESONIDE-FORMOTEROL FUMARATE 80-4.5 MCG/ACT IN AERO: 2.0000 | INHALATION_SPRAY | Freq: Two times a day (BID) | RESPIRATORY_TRACT | 12 refills | Status: DC

## 2020-12-21 MED ORDER — ALBUTEROL SULFATE HFA 108 (90 BASE) MCG/ACT IN AERS: 2.0000 | INHALATION_SPRAY | Freq: Four times a day (QID) | RESPIRATORY_TRACT | 2 refills | Status: DC | PRN

## 2020-12-21 NOTE — Telephone Encounter (Signed)
I called daughter & I have resent in neb solution as well as Proventil inhaler. However when I called pharmacy to make sure I was told by pharmacist that Symbicort is jot covered. The generic Advair is the Fluticasone Sameterol.

## 2020-12-21 NOTE — Telephone Encounter (Signed)
Able to reach pt's daughter Amy Calhoun regarding Amy Calhoun recent lab work, [name], [name] had a chance to review their results and advised   "Labs showed elevated potassium, stop lisinopril and re-check BMET in a week.  Also some volume overload by labs, it seems patient needs daily lasix dose. recommend take lasix 10mg  daily as opposed to every other day.  CXR showed bronchitis, this can be followed up by PCP. Labs did not show active infection."  reports Amy Calhoun seen PCP and they were able to address her labs at [facility], FNP advised her to stop eating bananas and was able to discuss her recent CXR, recently on Z-pack ABX.  Pt schedule to have repeat labs next Wed, advised will have our provider's review them so if additional changes need to be made.   Amy Calhoun reports she will have pt stop lisinopril for now and take lasix daily until further instructed otherwise  All questions or concerns were address and no additional concerns at this time. Agreeable to plan, will call back for anything further.

## 2020-12-21 NOTE — Addendum Note (Signed)
Addended by: Allegra Grana on: 12/21/2020 12:59 PM   Modules accepted: Orders

## 2020-12-21 NOTE — Progress Notes (Signed)
Verbal consent for services obtained from patient prior to services given to TELEPHONE visit:   Location of call:  provider at work patient at home  Names of all persons present for services: Rennie Plowman, NP and patient Chief complaint:  Unable to bring mother into office today Accompanied by daughter who is primary historian. Mother is hard of hearing so I only spoke with Amy Calhoun on the phone.   Complains of productive cough which is primary concern. Cough has improved during day but at night time , she describes cough as persistent.  Completed zpak 4 days ago and cough 'slowed down'. Sore throat resolved after antibiotic.  Started during OV 12/13/20.  Cough is constant. Endorses nasal congestion. She has used albuterol inhaler every 4-6 hours. Daughter is concerned for 'weak cough' and she isnt able to get congestion 'up.' She has nebulizer and would like nebulizer solution as think this would be more helpful.  No fever, sob, ha Never smoker. No h/o asthma. Sa02 at home is  92%  Taking mucinex, robitussin without relief.   Complains of red area on her coccyx x 3 weeks when nursing home .Describes sore and red.  She is using Xeroform ( non adherent petroleum dressing)  , wound care spray and interested in wound care.  Skin is intact. She wears pull ups and working to keep her dry.   Anemia- she has restarted ferrous sulfate 324mg  qd.  Continues to have difficulty swallowing. She cannot tolerate clear fluids.  Emma mixes everything in yogurt and applesauce and she can take medications without choking. She can oatmeal, eat soup and sandwiches ;she can eat soft foods.  Upcoming appointment with Dr 01/03/21  She is not taking potassium supplement however eats two bananas twice per day.   Decreased amitriptyline to 100mg  at last visit. No increase of HA.   Seen 12/5/ 2022 cardiology Furth, PA.   HTN- Compliant with  lisinopril 2.5mg  daily,lopressor 12.5mg  on the AM and 25mg  at  the bedtime. Lasix 10mg  QOD. No leg swelling. No weight gain.  BNP 459 two days ago K 5.3  Hemoglobin 10.9 B12 912 two months ago Crt  0.91 ( improved from prior 1.12)  CXR yesterday shows bronchitis pattern.  No consolidation, lobar collapse or effusion.    BP Readings from Last 3 Encounters:  12/19/20 (!) 168/74  12/12/20 132/70  12/07/20 (!) 118/58     A/P/next steps:  Problem List Items Addressed This Visit       Cardiovascular and Mediastinum   Essential hypertension    Labile. Hyperkalemic and cardiology, Cadence Furth, advised to hold lisinopril  2.5 until repeat lab. She appears volume overloaded with BNP, and cardiology also advised to increase lasix 10mg  to QD from QOD which had been taking. Continue ,lopressor 12.5mg  on the AM and 25mg  at the bedtime. BMP in one weeks time.         Respiratory   Bronchitis    No acute respiratory distress. Counseled daughter to remain hypervigilant and if sa02 < 90%, she needs to call 911 or go to ED. She has completed zpak with persistent cough. Cardiology advised lasix 10mg  qd.  Albuterol neb solution and Advair have been sent for patient. Daughter will let me know how she is doing      Relevant Orders   Basic metabolic panel   Magnesium     Digestive   Esophageal dysmotility    She is able to stay hydrated and eating if in yogurt, applesauce.  Compliant with medications. Upcoming appointment with Dr Christella Hartigan, will follow.        Other   Anemia - Primary    Normocytic. Pending iron studies, stool cards. H/o CKD. Will monitor.  Lab Results  Component Value Date   HGB 10.1 (L) 12/19/2020         Relevant Orders   Fecal occult blood, imunochemical   IBC + Ferritin   Pressure ulcer    Unable to assess over telephone visit. Wound supplies prescribed. Skin intact. Referral to home health for home care wound services. Daughter declines referral to wound care outpatient.       Relevant Orders   Ambulatory referral to  Home Health   For home use only DME Other see comment   Other Visit Diagnoses     Non-healing open wound of heel, initial encounter       Non-healing open wound of left heel, initial encounter       Relevant Orders   For home use only DME Other see comment   Dysphagia, unspecified type       Relevant Orders   For home use only DME Other see comment        I spent 25 min  discussing plan of care over the phone.

## 2020-12-21 NOTE — Telephone Encounter (Signed)
Patient  daughter called in  Albuterol for nebulizer [facility] advise didn't get order for it . Need to get prescriptions sent

## 2020-12-21 NOTE — Telephone Encounter (Signed)
I sent in advair

## 2020-12-21 NOTE — Patient Instructions (Addendum)
Labs in a couple of days  Stop eating bananas while we recheck potassium  Please return stool cards to the office in setting of anemia.   Referral to home health  Let us know if you dont hear back within a week in regards to an appointment being scheduled.   Start nebulizer and symbicort  For pressure sore: Reduce or eliminate underlying contributing factors by providing pressure redistribution with proper positioning and support surfaces. We will work on proper nutrition Keep area covered and dry ( from urine, feces soiling)  Monitor sa02 and if drops less 90% , please take her to emergency room or call 911.   Let me know how you are doing.

## 2020-12-21 NOTE — Telephone Encounter (Signed)
Pt daughter called in stating that Pt saw  cardiologist on Monday. Pt daughter stated after cardiologist view Pt labs, cardiologist made further changes. Pt stated that cardiologist would like Pt to stop medication (lisinopril (ZESTRIL) 2.5 MG tablet) until lab are redone. Pt daughter stated that cardiologist would like pt to take medication (furosemide (LASIX) 20 MG tablet) every morning until lab are repeated. Pt daughter stated that cardiologist is aware that pt has an upcoming lab schedule. Pt daughter stated that everything is in Pt chart.

## 2020-12-22 ENCOUNTER — Ambulatory Visit: Payer: Medicare HMO | Admitting: Pharmacist

## 2020-12-22 ENCOUNTER — Other Ambulatory Visit: Payer: Self-pay

## 2020-12-22 DIAGNOSIS — I10 Essential (primary) hypertension: Secondary | ICD-10-CM

## 2020-12-22 DIAGNOSIS — E1169 Type 2 diabetes mellitus with other specified complication: Secondary | ICD-10-CM

## 2020-12-22 DIAGNOSIS — G8929 Other chronic pain: Secondary | ICD-10-CM

## 2020-12-22 DIAGNOSIS — N1831 Chronic kidney disease, stage 3a: Secondary | ICD-10-CM

## 2020-12-22 DIAGNOSIS — E1129 Type 2 diabetes mellitus with other diabetic kidney complication: Secondary | ICD-10-CM

## 2020-12-22 DIAGNOSIS — J40 Bronchitis, not specified as acute or chronic: Secondary | ICD-10-CM

## 2020-12-22 MED ORDER — FLUTICASONE-SALMETEROL 100-50 MCG/ACT IN AEPB: 1.0000 | INHALATION_SPRAY | Freq: Two times a day (BID) | RESPIRATORY_TRACT | 3 refills | Status: DC

## 2020-12-22 NOTE — Chronic Care Management (AMB) (Signed)
Chronic Care Management CCM Pharmacy Note  12/22/2020 Name:  Amy Calhoun MRN:  [id number] DOB:  1928/08/19  Summary: - Cough improving. Has not picked up Advair yet.  - Starting lower dose of amitriptyline today  Recommendations/Changes made from today's visit: - Continue current regimen at this time - RN CM referral for home support, disease management support for caregiver  Subjective: Amy Calhoun is an 87 y.o. year old female who is a primary patient of Amy Grana, FNP.  The CCM team was consulted for assistance with disease management and care coordination needs.    Engaged with patient's daughter, Amy Calhoun, by telephone for initial visit for pharmacy case management and/or care coordination services.   Objective:  Medications Reviewed Today     Reviewed by Amy Calhoun, RPH-CPP (Pharmacist) on 12/22/20 at 1444  Med List Status: <None>   Medication Order Taking? Sig Documenting Provider Last Dose Status Informant  Acetaminophen-Codeine 300-30 MG tablet [id number] Yes Take 1 tablet by mouth every 12 (twelve) hours as needed for pain (moderate to severe pain). Amy Grana, FNP Taking Active   albuterol (PROVENTIL) (2.5 MG/3ML) 0.083% nebulizer solution [id number] Yes Take 3 mLs (2.5 mg total) by nebulization every 6 (six) hours as needed for wheezing or shortness of breath. Amy Grana, FNP Taking Active   albuterol (VENTOLIN HFA) 108 (90 Base) MCG/ACT inhaler [id number] Yes Inhale 2 puffs into the lungs every 6 (six) hours as needed for wheezing or shortness of breath. Amy Grana, FNP Taking Active   amitriptyline (ELAVIL) 100 MG tablet [id number] Yes Take 1 tablet (100 mg total) by mouth at bedtime. Amy Grana, FNP Taking Active   ascorbic acid (VITAMIN C) 500 MG tablet [id number] Yes Take 500 mg by mouth daily. [provider] Taking Active Child  Cholecalciferol (VITAMIN D) 2000 units tablet [id number] Yes Take 2,000 Units  by mouth daily.  [provider] Taking Active Child  Continuous Blood Gluc Sensor (FREESTYLE LIBRE 14 DAY SENSOR) [place] [id number] Yes 1 Package by Does not apply route every 14 (fourteen) days. Calhoun, Amy Dolores, MD Taking Active Child  Cyanocobalamin Amy Calhoun IJ) [id number] Yes Inject as directed every 30 (thirty) days. [provider] Taking Active   docusate sodium (COLACE) 100 MG capsule [id number] Yes Take 1 capsule (100 mg total) by mouth 2 (two) times daily. Amy Caper, MD Taking Active Child  ezetimibe (ZETIA) 10 MG tablet [id number] Yes Take 1 tablet (10 mg total) by mouth daily. Amy Grana, FNP Taking Active Child  ferrous sulfate 324 MG TBEC [id number] Yes Take 324 mg by mouth every other day. [provider] Taking Active   fluticasone-salmeterol (ADVAIR) 100-50 MCG/ACT AEPB [id number] Yes Inhale 1 puff into the lungs 2 (two) times daily. Amy Grana, FNP Taking Active   furosemide (LASIX) 20 MG tablet [id number] Yes Take 10 mg by mouth daily. [provider] Taking Active   gabapentin (NEURONTIN) 100 MG capsule [id number] Yes Take 2 capsules (200 mg total) by mouth 3 (three) times daily. Amy Shadow, MD Taking Active Child           Med Note Tyrone Nine Dec 22, 2020  2:37 PM) 2 QAM, 1 midday, 2 QPM  glucagon 1 MG injection [id number]  Inject 1 mg into the muscle once as needed for up to 1 dose. Hypoglycemia If she is not tolerating PO. Amy Grana, FNP  Active   Insulin Glargine (LANTUS SOLOSTAR [place])  [id number] Yes Inject 14 Units into the skin at bedtime. [provider] Taking Active   insulin lispro (HUMALOG KWIKPEN) 100 UNIT/ML KwikPen [id number] Yes Inject 8 Units into the skin 3 (three) times daily. Max Daily 30 units Calhoun, Amy Dolores, MD Taking Active Child  Insulin Pen Needle 32G X 4 MM MISC [id number] Yes 1 Device by Does not apply route in the morning, at noon, in the evening, and at  bedtime. Calhoun, Amy Dolores, MD Taking Active Child  levothyroxine (SYNTHROID) 150 MCG tablet [id number] Yes TAKE 1 TABLET DAILY Amy Grana, FNP Taking Active Child  lisinopril (ZESTRIL) 2.5 MG tablet [id number] No Take 2.5 mg by mouth every other day.  Patient not taking: Reported on 12/22/2020   [provider] Not Taking Active   metoprolol tartrate (LOPRESSOR) 25 MG tablet [id number] Yes Take 25 mg by mouth. 1/2 tablet in AM and 1 tablet in the evening [provider] Taking Active   Misc. Devices (BARIATRIC ROLLATOR) MISC [id number] Yes Use as needed Amy Grana, FNP Taking Active Child  omeprazole (PRILOSEC) 40 MG capsule [id number] Yes TAKE 1 CAPSULE TWICE A DAY Amy Fee, MD Taking Active Child  pyridOXINE (VITAMIN B-6) 100 MG tablet [id number] Yes Take 100 mg by mouth daily. [provider] Taking Active Child            Pertinent Labs:   Lab Results  Component Value Date   HGBA1C 9.1 (A) 12/12/2020   Lab Results  Component Value Date   CHOL 168 11/17/2020   HDL 59 11/17/2020   LDLCALC 92 11/17/2020   TRIG 91 11/17/2020   CHOLHDL 2.8 11/17/2020   Lab Results  Component Value Date   CREATININE 0.91 12/19/2020   BUN 35 (H) 12/19/2020   NA 135 12/19/2020   K 5.3 (H) 12/19/2020   CL 103 12/19/2020   CO2 23 12/19/2020    SDOH:  (Social Determinants of Health) assessments and interventions performed:  SDOH Interventions    Flowsheet Row Most Recent Value  SDOH Interventions   Financial Strain Interventions Intervention Not Indicated       CCM Care Plan  Review of patient past medical history, allergies, medications, health status, including review of consultants reports, laboratory and other test data, was performed as part of comprehensive evaluation and provision of chronic care management services.   Care Plan : Medication Monitoring  Updates made by Amy Calhoun, RPH-CPP since 12/22/2020 12:00 AM      Problem: Diabetes, HTN      Long-Range Goal: Disease Progression Prevention   Start Date: 12/22/2020  This Visit's Progress: On track  Priority: High  Note:   Current Barriers:  Complex patient with multiple comorbidities at high risk of exacerbation  Pharmacist Clinical Goal(s):  patient will achieve adherence to monitoring guidelines and medication adherence to achieve therapeutic efficacy through collaboration with PharmD and provider.    Interventions: 1:1 collaboration with Amy Grana, FNP regarding development and update of comprehensive plan of care as evidenced by provider attestation and co-signature Inter-disciplinary care team collaboration (see longitudinal plan of care) Comprehensive medication review performed; medication list updated in electronic medical record  Diabetes:  Uncontrolled, but with more relaxed goal given age, comorbidites; current treatment: Lantus 14 units daily, Humalog per sliding scale;  Current glucose readings: using Libre 14 day CGM Daughter reports concerns with elevated blood sugars sometimes. Again reviewed risks of low blood sugar episdoes Consider upgrade to [place] 2 whenever able (based upon  Medicare billing and when Libre 14 day was obtained). Recommended to continue current regimen at this time along with collaboration with Dr. Lonzo Cloud  Heart Failure:  Managed by cardiology; current treatment:  ARNI/ACEi/ARB: lisinopril 2.5 mg - being held right now Beta blocker: metoprolol tartrate 12.5 mg QAM, 25 mg QPM SGLT2: consider moving forward  Mineralocorticoid Receptor Antagonist: none Diuretic: furosemide 10 mg daily Current home vitals: averaging 140-150s/60s in the morning; afternoon ~130s/60s; today is 165/81, pulse 68 Denies hypotensive symptoms Continue to collaborate with cardiology. Follow lab work next week.  Could consider SGLT2 for HF moving forward.   Hyperlipidemia, CAD:   Uncontrolled; current treatment:  ezetimibe 10 mg daily;  Medications previously tried: pravastatin Cardiology previously recommended statin. They declined. Continue to follow and discuss benefit vs risk of additional pharmacotherapy  Neuropathy: Controlled; but with opportunity for optimization; current regimen: amitriptyline 100 mg QPM (just reduced today, it took a few weeks for the medication to come from the pharmacy); gabapentin 200 mg QAM, 100 mg midday, 200 mg QPM Follow for impact of amitriptyline dose reduction.  Consider gabapentin dose reduction vs continued amitriptyline reduction moving forward.   S/P Bronchitis: Improving but not at baseline; current regimen: Advair 100/50 mcg BID, but they have not picked up from the pharmacy; albuterol HFA, albuterol nebulizer;    Patient Goals/Self-Care Activities patient will:  - take medications as prescribed as evidenced by patient report and record review       Plan: Telephone follow up appointment with care management team member scheduled for:  8 weeks  Catie Feliz Beam, PharmD, [place], CPP Clinical Pharmacist [employer] at [facility] [phone]

## 2020-12-22 NOTE — Patient Instructions (Signed)
[name],   It was great talking with you today about Ms. Serrina.   Let's see how the reduced dose of amitriptyline does. We will work on authorization for the inhaler.   Take care,   Catie Darnelle Maffucci, PharmD   Visit Information   Following is a copy of your full care plan:  Care Plan : Medication Monitoring  Updates made by De Hollingshead, RPH-CPP since 12/22/2020 12:00 AM     Problem: Diabetes, HTN      Long-Range Goal: Disease Progression Prevention   Start Date: 12/22/2020  This Visit's Progress: On track  Priority: High  Note:   Current Barriers:  Complex patient with multiple comorbidities at high risk of exacerbation  Pharmacist Clinical Goal(s):  patient will achieve adherence to monitoring guidelines and medication adherence to achieve therapeutic efficacy through collaboration with PharmD and provider.    Interventions: 1:1 collaboration with Burnard Hawthorne, FNP regarding development and update of comprehensive plan of care as evidenced by provider attestation and co-signature Inter-disciplinary care team collaboration (see longitudinal plan of care) Comprehensive medication review performed; medication list updated in electronic medical record  Diabetes:  Uncontrolled, but with more relaxed goal given age, comorbidites; current treatment: Lantus 14 units daily, Humalog per sliding scale;  Current glucose readings: using Libre 14 day CGM Daughter reports concerns with elevated blood sugars sometimes. Again reviewed risks of low blood sugar episdoes Consider upgrade to [place] 2 whenever able (based upon Medicare billing and when [place] 14 day was obtained). Recommended to continue current regimen at this time along with collaboration with Dr. Kelton Pillar  Heart Failure:  Managed by cardiology; current treatment:  ARNI/ACEi/ARB: lisinopril 2.5 mg - being held right now Beta blocker: metoprolol tartrate 12.5 mg QAM, 25 mg QPM SGLT2: consider moving forward   Mineralocorticoid Receptor Antagonist: none Diuretic: furosemide 10 mg daily Current home vitals: averaging 140-150s/60s in the morning; afternoon ~130s/60s; today is 165/81, pulse 68 Denies hypotensive symptoms Continue to collaborate with cardiology. Follow lab work next week.  Could consider SGLT2 for HF moving forward.   Hyperlipidemia, CAD:   Uncontrolled; current treatment: ezetimibe 10 mg daily;  Medications previously tried: pravastatin Cardiology previously recommended statin. They declined. Continue to follow and discuss benefit vs risk of additional pharmacotherapy  Neuropathy: Controlled; but with opportunity for optimization; current regimen: amitriptyline 100 mg QPM (just reduced today, it took a few weeks for the medication to come from the pharmacy); gabapentin 200 mg QAM, 100 mg midday, 200 mg QPM Follow for impact of amitriptyline dose reduction.  Consider gabapentin dose reduction vs continued amitriptyline reduction moving forward.   S/P Bronchitis: Improving but not at baseline; current regimen: Advair 100/50 mcg BID, but they have not picked up from the pharmacy; albuterol HFA, albuterol nebulizer;    Patient Goals/Self-Care Activities patient will:  - take medications as prescribed as evidenced by patient report and record review      Consent to CCM Services: Ms. Hartung was given information about Chronic Care Management services including:  CCM service includes personalized support from designated clinical staff supervised by her physician, including individualized plan of care and coordination with other care providers 24/7 contact phone numbers for assistance for urgent and routine care needs. Service will only be billed when office clinical staff spend 20 minutes or more in a month to coordinate care. Only one practitioner may furnish and bill the service in a calendar month. The patient may stop CCM services at any time (effective at the end of  the month)  by phone call to the office staff. The patient will be responsible for cost sharing (co-pay) of up to 20% of the service fee (after annual deductible is met).  Patient agreed to services and verbal consent obtained.   Plan: Telephone follow up appointment with care management team member scheduled for:  8 weeks  Catie Darnelle Maffucci, PharmD, [place], CPP Clinical Pharmacist [facility] at [facility] [phone]   Please call the care guide team at [phone] if you need to cancel or reschedule your appointment.   Patient verbalizes understanding of instructions provided today and agrees to view in [place].

## 2020-12-23 ENCOUNTER — Telehealth: Payer: Self-pay

## 2020-12-23 NOTE — Chronic Care Management (AMB) (Signed)
  Chronic Care Management   Note  12/23/2020 Name: Sally-Ann Cutbirth MRN: [id number] DOB: 08/12/1928  Onetha Mae Lukasiewicz is a 87 y.o. year old female who is a primary care patient of Allegra Grana, FNP. Joline Mae Beever is currently enrolled in care management services. An additional referral for RN CM  was placed.   Follow up plan: Telephone appointment with care management team member scheduled for:01/11/2021   Penne Lash, RMA Care Guide, Embedded Care Coordination [facility]  [place], [place] [address] Direct Dial: [phone] [email or web address]: [email or web address]

## 2020-12-25 ENCOUNTER — Encounter: Payer: Self-pay | Admitting: Family

## 2020-12-25 NOTE — Assessment & Plan Note (Signed)
She is able to stay hydrated and eating if in yogurt, applesauce. Compliant with medications. Upcoming appointment with Dr Christella Hartigan, will follow.

## 2020-12-25 NOTE — Assessment & Plan Note (Signed)
Normocytic. Pending iron studies, stool cards. H/o CKD. Will monitor.  Lab Results  Component Value Date   HGB 10.1 (L) 12/19/2020

## 2020-12-25 NOTE — Assessment & Plan Note (Signed)
Labile. Hyperkalemic and cardiology, Cadence Furth, advised to hold lisinopril  2.5 until repeat lab. She appears volume overloaded with BNP, and cardiology also advised to increase lasix 10mg  to QD from QOD which had been taking. Continue ,lopressor 12.5mg  on the AM and 25mg  at the bedtime. BMP in one weeks time.

## 2020-12-25 NOTE — Assessment & Plan Note (Addendum)
No acute respiratory distress. Counseled daughter to remain hypervigilant and if sa02 < 90%, she needs to call 911 or go to ED. She has completed zpak with persistent cough. Cardiology advised lasix 10mg  qd.  Albuterol neb solution and Advair have been sent for patient. Daughter will let me know how she is doing

## 2020-12-25 NOTE — Assessment & Plan Note (Signed)
Unable to assess over telephone visit. Wound supplies prescribed. Skin intact. Referral to home health for home care wound services. Daughter declines referral to wound care outpatient.

## 2020-12-26 ENCOUNTER — Telehealth: Payer: Medicare HMO | Admitting: Physician Assistant

## 2020-12-26 DIAGNOSIS — E1169 Type 2 diabetes mellitus with other specified complication: Secondary | ICD-10-CM | POA: Diagnosis present

## 2020-12-26 DIAGNOSIS — Z7952 Long term (current) use of systemic steroids: Secondary | ICD-10-CM

## 2020-12-26 DIAGNOSIS — Z83438 Family history of other disorder of lipoprotein metabolism and other lipidemia: Secondary | ICD-10-CM

## 2020-12-26 DIAGNOSIS — E875 Hyperkalemia: Secondary | ICD-10-CM | POA: Diagnosis present

## 2020-12-26 DIAGNOSIS — L89153 Pressure ulcer of sacral region, stage 3: Secondary | ICD-10-CM | POA: Diagnosis present

## 2020-12-26 DIAGNOSIS — N1832 Chronic kidney disease, stage 3b: Secondary | ICD-10-CM | POA: Diagnosis present

## 2020-12-26 DIAGNOSIS — Z79899 Other long term (current) drug therapy: Secondary | ICD-10-CM

## 2020-12-26 DIAGNOSIS — R627 Adult failure to thrive: Secondary | ICD-10-CM | POA: Diagnosis present

## 2020-12-26 DIAGNOSIS — E039 Hypothyroidism, unspecified: Secondary | ICD-10-CM | POA: Diagnosis present

## 2020-12-26 DIAGNOSIS — I447 Left bundle-branch block, unspecified: Secondary | ICD-10-CM | POA: Diagnosis present

## 2020-12-26 DIAGNOSIS — I214 Non-ST elevation (NSTEMI) myocardial infarction: Secondary | ICD-10-CM | POA: Diagnosis not present

## 2020-12-26 DIAGNOSIS — J9601 Acute respiratory failure with hypoxia: Secondary | ICD-10-CM | POA: Diagnosis present

## 2020-12-26 DIAGNOSIS — Z9841 Cataract extraction status, right eye: Secondary | ICD-10-CM

## 2020-12-26 DIAGNOSIS — E785 Hyperlipidemia, unspecified: Secondary | ICD-10-CM | POA: Diagnosis present

## 2020-12-26 DIAGNOSIS — R0602 Shortness of breath: Secondary | ICD-10-CM | POA: Diagnosis not present

## 2020-12-26 DIAGNOSIS — N179 Acute kidney failure, unspecified: Secondary | ICD-10-CM | POA: Diagnosis present

## 2020-12-26 DIAGNOSIS — Z9049 Acquired absence of other specified parts of digestive tract: Secondary | ICD-10-CM

## 2020-12-26 DIAGNOSIS — R051 Acute cough: Secondary | ICD-10-CM

## 2020-12-26 DIAGNOSIS — I130 Hypertensive heart and chronic kidney disease with heart failure and stage 1 through stage 4 chronic kidney disease, or unspecified chronic kidney disease: Secondary | ICD-10-CM | POA: Diagnosis present

## 2020-12-26 DIAGNOSIS — E440 Moderate protein-calorie malnutrition: Secondary | ICD-10-CM | POA: Diagnosis present

## 2020-12-26 DIAGNOSIS — Z90710 Acquired absence of both cervix and uterus: Secondary | ICD-10-CM

## 2020-12-26 DIAGNOSIS — J189 Pneumonia, unspecified organism: Secondary | ICD-10-CM | POA: Diagnosis present

## 2020-12-26 DIAGNOSIS — R296 Repeated falls: Secondary | ICD-10-CM | POA: Diagnosis present

## 2020-12-26 DIAGNOSIS — Z66 Do not resuscitate: Secondary | ICD-10-CM | POA: Diagnosis present

## 2020-12-26 DIAGNOSIS — Z79890 Hormone replacement therapy (postmenopausal): Secondary | ICD-10-CM

## 2020-12-26 DIAGNOSIS — E1122 Type 2 diabetes mellitus with diabetic chronic kidney disease: Secondary | ICD-10-CM | POA: Diagnosis present

## 2020-12-26 DIAGNOSIS — R54 Age-related physical debility: Secondary | ICD-10-CM | POA: Diagnosis present

## 2020-12-26 DIAGNOSIS — Z888 Allergy status to other drugs, medicaments and biological substances status: Secondary | ICD-10-CM

## 2020-12-26 DIAGNOSIS — E1142 Type 2 diabetes mellitus with diabetic polyneuropathy: Secondary | ICD-10-CM | POA: Diagnosis present

## 2020-12-26 DIAGNOSIS — I442 Atrioventricular block, complete: Secondary | ICD-10-CM | POA: Diagnosis present

## 2020-12-26 DIAGNOSIS — Z794 Long term (current) use of insulin: Secondary | ICD-10-CM

## 2020-12-26 DIAGNOSIS — R1310 Dysphagia, unspecified: Secondary | ICD-10-CM | POA: Diagnosis present

## 2020-12-26 DIAGNOSIS — Z8261 Family history of arthritis: Secondary | ICD-10-CM

## 2020-12-26 DIAGNOSIS — I951 Orthostatic hypotension: Secondary | ICD-10-CM | POA: Diagnosis present

## 2020-12-26 DIAGNOSIS — Z833 Family history of diabetes mellitus: Secondary | ICD-10-CM

## 2020-12-26 DIAGNOSIS — Z818 Family history of other mental and behavioral disorders: Secondary | ICD-10-CM

## 2020-12-26 DIAGNOSIS — E1165 Type 2 diabetes mellitus with hyperglycemia: Secondary | ICD-10-CM | POA: Diagnosis present

## 2020-12-26 DIAGNOSIS — I5033 Acute on chronic diastolic (congestive) heart failure: Secondary | ICD-10-CM | POA: Diagnosis present

## 2020-12-26 DIAGNOSIS — Z9842 Cataract extraction status, left eye: Secondary | ICD-10-CM

## 2020-12-26 DIAGNOSIS — D631 Anemia in chronic kidney disease: Secondary | ICD-10-CM | POA: Diagnosis present

## 2020-12-26 DIAGNOSIS — Z950 Presence of cardiac pacemaker: Secondary | ICD-10-CM

## 2020-12-26 DIAGNOSIS — Z8249 Family history of ischemic heart disease and other diseases of the circulatory system: Secondary | ICD-10-CM

## 2020-12-26 DIAGNOSIS — Z20822 Contact with and (suspected) exposure to covid-19: Secondary | ICD-10-CM | POA: Diagnosis present

## 2020-12-26 DIAGNOSIS — Z6821 Body mass index (BMI) 21.0-21.9, adult: Secondary | ICD-10-CM

## 2020-12-26 DIAGNOSIS — K219 Gastro-esophageal reflux disease without esophagitis: Secondary | ICD-10-CM | POA: Diagnosis present

## 2020-12-26 DIAGNOSIS — I25119 Atherosclerotic heart disease of native coronary artery with unspecified angina pectoris: Secondary | ICD-10-CM | POA: Diagnosis present

## 2020-12-26 MED ORDER — DOXYCYCLINE HYCLATE 100 MG PO CAPS: 100.0000 mg | ORAL_CAPSULE | Freq: Two times a day (BID) | ORAL | 0 refills | Status: DC

## 2020-12-26 MED ORDER — PREDNISONE 50 MG PO TABS: 50.0000 mg | ORAL_TABLET | Freq: Every day | ORAL | 0 refills | Status: AC

## 2020-12-26 NOTE — Telephone Encounter (Signed)
Pt daughter called in stating that Pt is not feeling good and Pt has a bad cough. Pt daughter stated that Pt was prescribed a non steroid medication. Pt daughter stated that the Non Steroid medication didn't help Pt. Pt daughter stated that Pt/Pt daughter did a virtual visit with provider and provider prescribed Pt with a steroid medication. Pt daughter stated that Pt is still not getting any better. Pt daughter stated that she had reach back out to provider and they are requesting for Pt to do a chest x-ray. Pt daughter was wondering if Pt can do chest x-ray on the same day of lab xray. Pt daughter is requesting callback

## 2020-12-26 NOTE — ED Triage Notes (Signed)
Pt to triage via w/c with no distress noted; pt c/o left sided CP x 4 days, nonradiating with no accomp symptoms

## 2020-12-26 NOTE — Progress Notes (Signed)
Amy Calhoun, labell are scheduled for a virtual visit with your provider today.    Just as we do with appointments in the office, we must obtain your consent to participate.  Your consent will be active for this visit and any virtual visit you may have with one of our providers in the next 365 days.    If you have a MyChart account, I can also send a copy of this consent to you electronically.  All virtual visits are billed to your insurance company just like a traditional visit in the office.  As this is a virtual visit, video technology does not allow for your provider to perform a traditional examination.  This may limit your provider's ability to fully assess your condition.  If your provider identifies any concerns that need to be evaluated in person or the need to arrange testing such as labs, EKG, etc, we will make arrangements to do so.    Although advances in technology are sophisticated, we cannot ensure that it will always work on either your end or our end.  If the connection with a video visit is poor, we may have to switch to a telephone visit.  With either a video or telephone visit, we are not always able to ensure that we have a secure connection.   I need to obtain your verbal consent now.   Are you willing to proceed with your visit today?   Amy Calhoun has provided verbal consent on 12/26/2020 for a virtual visit (video or telephone).   Amy Calhoun, [place] 12/26/2020  8:37 AM   Date:  12/26/2020   ID:  Amy Calhoun, DOB 28-Oct-1928, MRN [id number]  Patient Location: Home Provider Location: Home Office   Participants: Patient and Provider for Visit and Wrap up  Method of visit: Video  Location of Patient: Home Location of Provider: Home Office Consent was obtain for visit over the video. Services rendered by provider: Visit was performed via video  A video enabled telemedicine application was used and I verified that I am speaking with the correct person using  two identifiers.  PCP:  Burnard Hawthorne, FNP   Chief Complaint:  cough  History of Present Illness:    Amy Calhoun is a 87 y.o. female with history as stated below. Presents video telehealth for an acute care visit  Daughter is present on video and provides the history.   Pt has had a cough for the last 3-4 weeks. This started after being exposed to a family member with similar symptoms. She was seen a few weeks ago by her PCP and started on an abx 2 weeks ago. This did not improve her chest congestion or cough. Last week she was seen again and had an xray completed which showed bronchitis. Patient was prescribed nebulizer treatments at that time and has not had any improvement of her symptoms.   BP 154/72, HR 74, O2 91-92% on RA. (This is baseline for her).   Past Medical, Surgical, Social History, Allergies, and Medications have been Reviewed.  Past Medical History:  Diagnosis Date   Allergy    Ambulates with cane    Anemia    At high risk for falls    Cataract    removed both eyes   Chronic kidney disease 08/07/2016   Chronic renal failure, stage III   Depression    Esophageal dysphagia    Gastric ulcer    GERD (gastroesophageal reflux disease)    Hyperlipidemia  Hypertension    Hypothyroidism    IDDM (insulin dependent diabetes mellitus)    Ischemic colitis (HCC)    Neuromuscular disorder (HCC)    neuropathy   Osteoarthritis    Ulcer of esophagus     Current Meds  Medication Sig   doxycycline (VIBRAMYCIN) 100 MG capsule Take 1 capsule (100 mg total) by mouth 2 (two) times daily for 7 days.   predniSONE (DELTASONE) 50 MG tablet Take 1 tablet (50 mg total) by mouth daily for 5 days.     Allergies:   Pravachol [pravastatin]   ROS See HPI for history of present illness.  Physical Exam Vitals and nursing note reviewed.  Constitutional:      Appearance: She is well-developed.  HENT:     Head: Normocephalic and atraumatic.  Cardiovascular:      Rate and Rhythm: Normal rate.     Comments: Per daughters VS report Musculoskeletal:     Cervical back: Neck supple.  Neurological:     Mental Status: She is alert.            MDM: Discussed that VV is not the preferred method for this encounter and that pt needs to have an in person visit for repeat CXR given duration of sxs and increased age. Daughter states she does not want to take pt to the ED because "it's a morgue". I advised that I will send rx for abx and prednisone however the patient will need to be seen in person by PCP or UC. Daughter is agreeable to do so.   There are no diagnoses linked to this encounter.   Time:   Today, I have spent 20 minutes with the patient with telehealth technology discussing the above problems, reviewing the chart, previous notes, medications and orders.    Tests Ordered: No orders of the defined types were placed in this encounter.   Medication Changes: Meds ordered this encounter  Medications   predniSONE (DELTASONE) 50 MG tablet    Sig: Take 1 tablet (50 mg total) by mouth daily for 5 days.    Dispense:  5 tablet    Refill:  0    Order Specific Question:   Supervising Provider    Answer:   Hyacinth Meeker, BRIAN [[id number]]   doxycycline (VIBRAMYCIN) 100 MG capsule    Sig: Take 1 capsule (100 mg total) by mouth 2 (two) times daily for 7 days.    Dispense:  14 capsule    Refill:  0    Order Specific Question:   Supervising Provider    Answer:   Eber Hong [[id number]]     Disposition:  Follow up  Signed, Karrie Meres, PA-C  12/26/2020 8:37 AM

## 2020-12-26 NOTE — ED Triage Notes (Signed)
EMS brings pt in from home for c/o CP x 4 days; dx bronchitis recently, currently taking prednisone & antibiotics; pacemaker placed 6wks ago

## 2020-12-26 NOTE — Patient Instructions (Signed)
Amy Calhoun, thank you for joining [employer], PA-C for today's virtual visit.  While this provider is not your primary care provider (PCP), if your PCP is located in our provider database this encounter information will be shared with them immediately following your visit.  Consent: (Patient) Amy Calhoun provided verbal consent for this virtual visit at the beginning of the encounter.  Current Medications:  Current Outpatient Medications:    doxycycline (VIBRAMYCIN) 100 MG capsule, Take 1 capsule (100 mg total) by mouth 2 (two) times daily for 7 days., Disp: 14 capsule, Rfl: 0   predniSONE (DELTASONE) 50 MG tablet, Take 1 tablet (50 mg total) by mouth daily for 5 days., Disp: 5 tablet, Rfl: 0   Acetaminophen-Codeine 300-30 MG tablet, Take 1 tablet by mouth every 12 (twelve) hours as needed for pain (moderate to severe pain)., Disp: 60 tablet, Rfl: 0   albuterol (PROVENTIL) (2.5 MG/3ML) 0.083% nebulizer solution, Take 3 mLs (2.5 mg total) by nebulization every 6 (six) hours as needed for wheezing or shortness of breath., Disp: 150 mL, Rfl: 1   albuterol (VENTOLIN HFA) 108 (90 Base) MCG/ACT inhaler, Inhale 2 puffs into the lungs every 6 (six) hours as needed for wheezing or shortness of breath., Disp: 1 each, Rfl: 2   amitriptyline (ELAVIL) 100 MG tablet, Take 1 tablet (100 mg total) by mouth at bedtime., Disp: 90 tablet, Rfl: 1   ascorbic acid (VITAMIN C) 500 MG tablet, Take 500 mg by mouth daily., Disp: , Rfl:    Cholecalciferol (VITAMIN D) 2000 units tablet, Take 2,000 Units by mouth daily. , Disp: , Rfl:    Continuous Blood Gluc Sensor (FREESTYLE LIBRE 14 DAY SENSOR) MISC, 1 Package by Does not apply route every 14 (fourteen) days., Disp: 6 each, Rfl: 3   Cyanocobalamin (DODEX IJ), Inject as directed every 30 (thirty) days., Disp: , Rfl:    docusate sodium (COLACE) 100 MG capsule, Take 1 capsule (100 mg total) by mouth 2 (two) times daily., Disp: 10 capsule, Rfl: 0    ezetimibe (ZETIA) 10 MG tablet, Take 1 tablet (10 mg total) by mouth daily., Disp: 90 tablet, Rfl: 3   ferrous sulfate 324 MG TBEC, Take 324 mg by mouth every other day., Disp: , Rfl:    fluticasone-salmeterol (ADVAIR) 100-50 MCG/ACT AEPB, Inhale 1 puff into the lungs 2 (two) times daily., Disp: 1 each, Rfl: 3   furosemide (LASIX) 20 MG tablet, Take 10 mg by mouth daily., Disp: , Rfl:    gabapentin (NEURONTIN) 100 MG capsule, Take 2 capsules (200 mg total) by mouth 3 (three) times daily., Disp: 540 capsule, Rfl: 3   glucagon 1 MG injection, Inject 1 mg into the muscle once as needed for up to 1 dose. Hypoglycemia If she is not tolerating PO., Disp: 2 each, Rfl: 3   Insulin Glargine (LANTUS SOLOSTAR [place]), Inject 14 Units into the skin at bedtime., Disp: , Rfl:    insulin lispro (HUMALOG KWIKPEN) 100 UNIT/ML KwikPen, Inject 8 Units into the skin 3 (three) times daily. Max Daily 30 units, Disp: 30 mL, Rfl: 3   Insulin Pen Needle 32G X 4 MM MISC, 1 Device by Does not apply route in the morning, at noon, in the evening, and at bedtime., Disp: 150 each, Rfl: 11   levothyroxine (SYNTHROID) 150 MCG tablet, TAKE 1 TABLET DAILY, Disp: 90 tablet, Rfl: 3   lisinopril (ZESTRIL) 2.5 MG tablet, Take 2.5 mg by mouth every other day. (Patient not taking: Reported  on 12/22/2020), Disp: , Rfl:    metoprolol tartrate (LOPRESSOR) 25 MG tablet, Take 25 mg by mouth. 1/2 tablet in AM and 1 tablet in the evening, Disp: , Rfl:    Misc. Devices (BARIATRIC ROLLATOR) MISC, Use as needed, Disp: 1 each, Rfl: 0   Multiple Vitamins-Minerals (PRESERVISION AREDS PO), Take 1 tablet by mouth daily., Disp: , Rfl:    omeprazole (PRILOSEC) 40 MG capsule, TAKE 1 CAPSULE TWICE A DAY, Disp: 180 capsule, Rfl: 3   pyridOXINE (VITAMIN B-6) 100 MG tablet, Take 100 mg by mouth daily., Disp: , Rfl:    Medications ordered in this encounter:  Meds ordered this encounter  Medications   predniSONE (DELTASONE) 50 MG tablet    Sig: Take 1 tablet (50  mg total) by mouth daily for 5 days.    Dispense:  5 tablet    Refill:  0    Order Specific Question:   Supervising Provider    Answer:   Hyacinth Meeker, BRIAN [[id number]]   doxycycline (VIBRAMYCIN) 100 MG capsule    Sig: Take 1 capsule (100 mg total) by mouth 2 (two) times daily for 7 days.    Dispense:  14 capsule    Refill:  0    Order Specific Question:   Supervising Provider    Answer:   Hyacinth Meeker, BRIAN [[id number]]     *If you need refills on other medications prior to your next appointment, please contact your pharmacy*  Follow-Up: Call back or seek an in-person evaluation if the symptoms worsen or if the condition fails to improve as anticipated.  Other Instructions Administer antibiotics and prednisone as prescribed   The patient will need to be seen in person in the next 1-2 days for a physical exam and she really should have a repeat chest xray completed.    If you have been instructed to have an in-person evaluation today at a local Urgent Care facility, please use the link below. It will take you to a list of all of our available [facility], including address, phone number and hours of operation. Please do not delay care.  [facility]  If you or a family member do not have a primary care provider, use the link below to schedule a visit and establish care. When you choose a [facility] primary care physician or advanced practice provider, you gain a long-term partner in health. Find a Primary Care Provider  Learn more about [facility]'s in-office and virtual care options: [facility] - Get Care Now

## 2020-12-27 ENCOUNTER — Encounter: Payer: Self-pay | Admitting: Emergency Medicine

## 2020-12-27 ENCOUNTER — Other Ambulatory Visit: Payer: Self-pay

## 2020-12-27 ENCOUNTER — Inpatient Hospital Stay: Payer: Medicare HMO

## 2020-12-27 ENCOUNTER — Emergency Department: Payer: Medicare HMO

## 2020-12-27 ENCOUNTER — Inpatient Hospital Stay
Admission: EM | Admit: 2020-12-27 | Discharge: 2020-12-30 | DRG: 280 | Disposition: A | Discharge status: Hospice | Payer: Medicare HMO | Attending: Internal Medicine | Admitting: Internal Medicine

## 2020-12-27 ENCOUNTER — Inpatient Hospital Stay (HOSPITAL_COMMUNITY)
Admit: 2020-12-27 | Discharge: 2020-12-27 | Disposition: A | Discharge status: Home / Self Care | Payer: Medicare HMO | Attending: Internal Medicine | Admitting: Internal Medicine

## 2020-12-27 DIAGNOSIS — I130 Hypertensive heart and chronic kidney disease with heart failure and stage 1 through stage 4 chronic kidney disease, or unspecified chronic kidney disease: Secondary | ICD-10-CM | POA: Diagnosis present

## 2020-12-27 DIAGNOSIS — E785 Hyperlipidemia, unspecified: Secondary | ICD-10-CM | POA: Diagnosis not present

## 2020-12-27 DIAGNOSIS — N1832 Chronic kidney disease, stage 3b: Secondary | ICD-10-CM | POA: Diagnosis present

## 2020-12-27 DIAGNOSIS — E1129 Type 2 diabetes mellitus with other diabetic kidney complication: Secondary | ICD-10-CM | POA: Diagnosis present

## 2020-12-27 DIAGNOSIS — N179 Acute kidney failure, unspecified: Secondary | ICD-10-CM | POA: Diagnosis not present

## 2020-12-27 DIAGNOSIS — I442 Atrioventricular block, complete: Secondary | ICD-10-CM | POA: Diagnosis not present

## 2020-12-27 DIAGNOSIS — I5030 Unspecified diastolic (congestive) heart failure: Secondary | ICD-10-CM | POA: Diagnosis not present

## 2020-12-27 DIAGNOSIS — N1831 Chronic kidney disease, stage 3a: Secondary | ICD-10-CM | POA: Diagnosis not present

## 2020-12-27 DIAGNOSIS — E1165 Type 2 diabetes mellitus with hyperglycemia: Secondary | ICD-10-CM

## 2020-12-27 DIAGNOSIS — R0602 Shortness of breath: Secondary | ICD-10-CM

## 2020-12-27 DIAGNOSIS — E1169 Type 2 diabetes mellitus with other specified complication: Secondary | ICD-10-CM | POA: Diagnosis not present

## 2020-12-27 DIAGNOSIS — I5032 Chronic diastolic (congestive) heart failure: Secondary | ICD-10-CM

## 2020-12-27 DIAGNOSIS — Z9842 Cataract extraction status, left eye: Secondary | ICD-10-CM | POA: Diagnosis not present

## 2020-12-27 DIAGNOSIS — Z7189 Other specified counseling: Secondary | ICD-10-CM | POA: Diagnosis not present

## 2020-12-27 DIAGNOSIS — Z950 Presence of cardiac pacemaker: Secondary | ICD-10-CM

## 2020-12-27 DIAGNOSIS — I214 Non-ST elevation (NSTEMI) myocardial infarction: Secondary | ICD-10-CM | POA: Diagnosis not present

## 2020-12-27 DIAGNOSIS — I10 Essential (primary) hypertension: Secondary | ICD-10-CM | POA: Diagnosis present

## 2020-12-27 DIAGNOSIS — N183 Chronic kidney disease, stage 3 unspecified: Secondary | ICD-10-CM | POA: Diagnosis present

## 2020-12-27 DIAGNOSIS — I5031 Acute diastolic (congestive) heart failure: Secondary | ICD-10-CM | POA: Diagnosis not present

## 2020-12-27 DIAGNOSIS — D649 Anemia, unspecified: Secondary | ICD-10-CM | POA: Diagnosis present

## 2020-12-27 DIAGNOSIS — Z66 Do not resuscitate: Secondary | ICD-10-CM | POA: Diagnosis present

## 2020-12-27 DIAGNOSIS — L8990 Pressure ulcer of unspecified site, unspecified stage: Secondary | ICD-10-CM | POA: Insufficient documentation

## 2020-12-27 DIAGNOSIS — J189 Pneumonia, unspecified organism: Secondary | ICD-10-CM | POA: Diagnosis present

## 2020-12-27 DIAGNOSIS — E039 Hypothyroidism, unspecified: Secondary | ICD-10-CM | POA: Diagnosis present

## 2020-12-27 DIAGNOSIS — Z888 Allergy status to other drugs, medicaments and biological substances status: Secondary | ICD-10-CM | POA: Diagnosis not present

## 2020-12-27 DIAGNOSIS — G4700 Insomnia, unspecified: Secondary | ICD-10-CM

## 2020-12-27 DIAGNOSIS — E440 Moderate protein-calorie malnutrition: Secondary | ICD-10-CM | POA: Diagnosis present

## 2020-12-27 DIAGNOSIS — E1142 Type 2 diabetes mellitus with diabetic polyneuropathy: Secondary | ICD-10-CM | POA: Diagnosis present

## 2020-12-27 DIAGNOSIS — Z20822 Contact with and (suspected) exposure to covid-19: Secondary | ICD-10-CM | POA: Diagnosis present

## 2020-12-27 DIAGNOSIS — I25118 Atherosclerotic heart disease of native coronary artery with other forms of angina pectoris: Secondary | ICD-10-CM | POA: Diagnosis present

## 2020-12-27 DIAGNOSIS — I5033 Acute on chronic diastolic (congestive) heart failure: Secondary | ICD-10-CM | POA: Diagnosis present

## 2020-12-27 DIAGNOSIS — Z9841 Cataract extraction status, right eye: Secondary | ICD-10-CM | POA: Diagnosis not present

## 2020-12-27 DIAGNOSIS — I447 Left bundle-branch block, unspecified: Secondary | ICD-10-CM | POA: Diagnosis present

## 2020-12-27 DIAGNOSIS — M1612 Unilateral primary osteoarthritis, left hip: Secondary | ICD-10-CM

## 2020-12-27 DIAGNOSIS — L89153 Pressure ulcer of sacral region, stage 3: Secondary | ICD-10-CM | POA: Diagnosis present

## 2020-12-27 DIAGNOSIS — D631 Anemia in chronic kidney disease: Secondary | ICD-10-CM | POA: Diagnosis present

## 2020-12-27 DIAGNOSIS — E1122 Type 2 diabetes mellitus with diabetic chronic kidney disease: Secondary | ICD-10-CM | POA: Diagnosis present

## 2020-12-27 DIAGNOSIS — J9601 Acute respiratory failure with hypoxia: Secondary | ICD-10-CM

## 2020-12-27 DIAGNOSIS — I2510 Atherosclerotic heart disease of native coronary artery without angina pectoris: Secondary | ICD-10-CM | POA: Diagnosis present

## 2020-12-27 DIAGNOSIS — Z794 Long term (current) use of insulin: Secondary | ICD-10-CM | POA: Diagnosis not present

## 2020-12-27 DIAGNOSIS — I509 Heart failure, unspecified: Secondary | ICD-10-CM

## 2020-12-27 LAB — CBC
HCT: 29.7 % — ABNORMAL LOW (ref 36.0–46.0)
HCT: 31.4 % — ABNORMAL LOW (ref 36.0–46.0)
Hemoglobin: 10.1 g/dL — ABNORMAL LOW (ref 12.0–15.0)
Hemoglobin: 9.6 g/dL — ABNORMAL LOW (ref 12.0–15.0)
MCH: 29.4 pg (ref 26.0–34.0)
MCH: 29.8 pg (ref 26.0–34.0)
MCHC: 32.2 g/dL (ref 30.0–36.0)
MCHC: 32.3 g/dL (ref 30.0–36.0)
MCV: 91.3 fL (ref 80.0–100.0)
MCV: 92.2 fL (ref 80.0–100.0)
Platelets: 308 10*3/uL (ref 150–400)
Platelets: 339 10*3/uL (ref 150–400)
RBC: 3.22 MIL/uL — ABNORMAL LOW (ref 3.87–5.11)
RBC: 3.44 MIL/uL — ABNORMAL LOW (ref 3.87–5.11)
RDW: 13.3 % (ref 11.5–15.5)
RDW: 13.4 % (ref 11.5–15.5)
WBC: 11.2 10*3/uL — ABNORMAL HIGH (ref 4.0–10.5)
WBC: 11.2 10*3/uL — ABNORMAL HIGH (ref 4.0–10.5)
nRBC: 0 % (ref 0.0–0.2)
nRBC: 0 % (ref 0.0–0.2)

## 2020-12-27 LAB — HEPARIN LEVEL (UNFRACTIONATED)
Heparin Unfractionated: 0.25 IU/mL — ABNORMAL LOW (ref 0.30–0.70)
Heparin Unfractionated: 0.16 IU/mL — ABNORMAL LOW (ref 0.30–0.70)

## 2020-12-27 LAB — RESP PANEL BY RT-PCR (FLU A&B, COVID) ARPGX2
Influenza A by PCR: NEGATIVE
Influenza B by PCR: NEGATIVE
SARS Coronavirus 2 by RT PCR: NEGATIVE

## 2020-12-27 LAB — BASIC METABOLIC PANEL
Anion gap: 9 (ref 5–15)
Anion gap: 9 (ref 5–15)
BUN: 45 mg/dL — ABNORMAL HIGH (ref 8–23)
BUN: 46 mg/dL — ABNORMAL HIGH (ref 8–23)
CO2: 22 mmol/L (ref 22–32)
CO2: 25 mmol/L (ref 22–32)
Calcium: 8.5 mg/dL — ABNORMAL LOW (ref 8.9–10.3)
Calcium: 8.7 mg/dL — ABNORMAL LOW (ref 8.9–10.3)
Chloride: 100 mmol/L (ref 98–111)
Chloride: 98 mmol/L (ref 98–111)
Creatinine, Ser: 1.31 mg/dL — ABNORMAL HIGH (ref 0.44–1.00)
Creatinine, Ser: 1.22 mg/dL — ABNORMAL HIGH (ref 0.44–1.00)
GFR, Estimated: 39 mL/min — ABNORMAL LOW (ref >60)
GFR, Estimated: 42 mL/min — ABNORMAL LOW (ref >60)
Glucose, Bld: 424 mg/dL — ABNORMAL HIGH (ref 70–99)
Glucose, Bld: 417 mg/dL — ABNORMAL HIGH (ref 70–99)
Potassium: 4.9 mmol/L (ref 3.5–5.1)
Potassium: 4.7 mmol/L (ref 3.5–5.1)
Sodium: 131 mmol/L — ABNORMAL LOW (ref 135–145)
Sodium: 132 mmol/L — ABNORMAL LOW (ref 135–145)

## 2020-12-27 LAB — PROTIME-INR
INR: 1 (ref 0.8–1.2)
Prothrombin Time: 13.2 seconds (ref 11.4–15.2)

## 2020-12-27 LAB — CBG MONITORING, ED
Glucose-Capillary: 421 mg/dL — ABNORMAL HIGH (ref 70–99)
Glucose-Capillary: 144 mg/dL — ABNORMAL HIGH (ref 70–99)
Glucose-Capillary: 83 mg/dL (ref 70–99)

## 2020-12-27 LAB — BRAIN NATRIURETIC PEPTIDE: B Natriuretic Peptide: 1034.4 pg/mL — ABNORMAL HIGH (ref 0.0–100.0)

## 2020-12-27 LAB — GLUCOSE, CAPILLARY: Glucose-Capillary: 109 mg/dL — ABNORMAL HIGH (ref 70–99)

## 2020-12-27 LAB — TROPONIN I (HIGH SENSITIVITY)
Troponin I (High Sensitivity): 124 ng/L (ref <18)
Troponin I (High Sensitivity): 803 ng/L (ref <18)

## 2020-12-27 LAB — APTT: aPTT: 33 seconds (ref 24–36)

## 2020-12-27 MED ORDER — ONDANSETRON HCL 4 MG/2ML IJ SOLN
4.0000 mg | Freq: Four times a day (QID) | INTRAMUSCULAR | Status: DC | PRN
  Administered 2020-12-28: 19:00:00 4 mg via INTRAVENOUS
  Filled 2020-12-27: qty 2

## 2020-12-27 MED ORDER — HEPARIN BOLUS VIA INFUSION
1000.0000 [IU] | Freq: Once | INTRAVENOUS | Status: AC
  Administered 2020-12-27: 1000 [IU] via INTRAVENOUS
  Filled 2020-12-27: qty 1000

## 2020-12-27 MED ORDER — NITROGLYCERIN 0.4 MG SL SUBL
0.4000 mg | SUBLINGUAL_TABLET | SUBLINGUAL | Status: DC | PRN
  Administered 2020-12-27 – 2020-12-30 (×4): 0.4 mg via SUBLINGUAL
  Filled 2020-12-27 (×3): qty 1

## 2020-12-27 MED ORDER — SODIUM CHLORIDE 0.9 % IV SOLN
500.0000 mg | INTRAVENOUS | Status: AC
  Administered 2020-12-27 – 2020-12-29 (×3): 500 mg via INTRAVENOUS
  Filled 2020-12-27: qty 500
  Filled 2020-12-27 (×2): qty 5

## 2020-12-27 MED ORDER — ASPIRIN EC 81 MG PO TBEC
81.0000 mg | DELAYED_RELEASE_TABLET | Freq: Every day | ORAL | Status: DC
  Administered 2020-12-28 – 2020-12-30 (×3): 81 mg via ORAL
  Filled 2020-12-27 (×3): qty 1

## 2020-12-27 MED ORDER — FUROSEMIDE 10 MG/ML IJ SOLN
40.0000 mg | Freq: Two times a day (BID) | INTRAMUSCULAR | Status: DC
  Administered 2020-12-27 – 2020-12-28 (×2): 40 mg via INTRAVENOUS
  Filled 2020-12-27 (×2): qty 4

## 2020-12-27 MED ORDER — FUROSEMIDE 10 MG/ML IJ SOLN
40.0000 mg | Freq: Once | INTRAMUSCULAR | Status: AC
  Administered 2020-12-27: 40 mg via INTRAVENOUS
  Filled 2020-12-27: qty 4

## 2020-12-27 MED ORDER — METOPROLOL TARTRATE 25 MG PO TABS
12.5000 mg | ORAL_TABLET | Freq: Two times a day (BID) | ORAL | Status: DC
  Administered 2020-12-27 – 2020-12-28 (×4): 12.5 mg via ORAL
  Filled 2020-12-27 (×4): qty 1

## 2020-12-27 MED ORDER — INSULIN GLARGINE-YFGN 100 UNIT/ML ~~LOC~~ SOLN
7.0000 [IU] | Freq: Every day | SUBCUTANEOUS | Status: DC
Start: 2020-12-27 — End: 2020-12-29
  Administered 2020-12-27 – 2020-12-28 (×2): 7 [IU] via SUBCUTANEOUS
  Filled 2020-12-27 (×3): qty 0.07

## 2020-12-27 MED ORDER — HEPARIN BOLUS VIA INFUSION
3600.0000 [IU] | Freq: Once | INTRAVENOUS | Status: AC
  Administered 2020-12-27: 3600 [IU] via INTRAVENOUS
  Filled 2020-12-27: qty 3600

## 2020-12-27 MED ORDER — INSULIN ASPART 100 UNIT/ML IJ SOLN
0.0000 [IU] | Freq: Three times a day (TID) | INTRAMUSCULAR | Status: DC
  Administered 2020-12-27: 15 [IU] via SUBCUTANEOUS
  Administered 2020-12-27: 2 [IU] via SUBCUTANEOUS
  Administered 2020-12-28: 13:00:00 3 [IU] via SUBCUTANEOUS
  Administered 2020-12-28: 16:00:00 2 [IU] via SUBCUTANEOUS
  Administered 2020-12-28: 10:00:00 3 [IU] via SUBCUTANEOUS
  Administered 2020-12-29: 2 [IU] via SUBCUTANEOUS
  Administered 2020-12-29: 8 [IU] via SUBCUTANEOUS
  Administered 2020-12-29 – 2020-12-30 (×2): 5 [IU] via SUBCUTANEOUS
  Administered 2020-12-30: 11 [IU] via SUBCUTANEOUS
  Filled 2020-12-27 (×10): qty 1

## 2020-12-27 MED ORDER — ACETAMINOPHEN 325 MG PO TABS
650.0000 mg | ORAL_TABLET | ORAL | Status: DC | PRN
  Administered 2020-12-27 – 2020-12-29 (×4): 650 mg via ORAL
  Filled 2020-12-27 (×4): qty 2

## 2020-12-27 MED ORDER — HEPARIN (PORCINE) 25000 UT/250ML-% IV SOLN
1100.0000 [IU]/h | INTRAVENOUS | Status: AC
  Administered 2020-12-27: 04:00:00 750 [IU]/h via INTRAVENOUS
  Administered 2020-12-28: 03:00:00 1050 [IU]/h via INTRAVENOUS
  Filled 2020-12-27 (×2): qty 250

## 2020-12-27 MED ORDER — SODIUM CHLORIDE 0.9 % IV SOLN
2.0000 g | INTRAVENOUS | Status: DC
  Administered 2020-12-27 – 2020-12-29 (×3): 2 g via INTRAVENOUS
  Filled 2020-12-27: qty 2
  Filled 2020-12-27 (×3): qty 20

## 2020-12-27 MED ORDER — ASPIRIN 81 MG PO CHEW
324.0000 mg | CHEWABLE_TABLET | Freq: Once | ORAL | Status: AC
Start: 2020-12-27 — End: 2020-12-27
  Administered 2020-12-27: 324 mg via ORAL
  Filled 2020-12-27: qty 4

## 2020-12-27 MED ORDER — BENZONATATE 100 MG PO CAPS
200.0000 mg | ORAL_CAPSULE | Freq: Three times a day (TID) | ORAL | Status: DC | PRN
  Administered 2020-12-27: 200 mg via ORAL
  Filled 2020-12-27: qty 2

## 2020-12-27 MED ORDER — INSULIN ASPART 100 UNIT/ML IJ SOLN: 0.0000 [IU] | Freq: Every day | INTRAMUSCULAR | Status: DC

## 2020-12-27 NOTE — H&P (Signed)
History and Physical    Amy Calhoun EHU:[id number] DOB: April 22, 1928 DOA: 12/27/2020  PCP: Allegra Grana, FNP   Patient coming from: home  I have personally briefly reviewed patient's relevant medical records in [facility]  Chief Complaint: chest pain  HPI: Amy Calhoun is a 87 y.o. 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  Patient reports for the past couple weeks she had a cough but without fever or chills and then developed chest pain in the past few days.  The pain is of moderate intensity nonradiating and is constant.  No aggravating or alleviating factors except for exertion.  Denies nausea, vomiting, diaphoresis and palpitations  ED course: Vitals within normal limits Blood work: Troponin [id number], BNP 1034 WBC 11,000 hemoglobin 9.6 Glucose 424, creatinine 1.31  EKG, personally viewed and interpreted: Sinus at 36 with LBBB  Chest x-ray diffuse interstitial coarsening may represent mild edema.  Atypical pneumonia not excluded  ED provider spoke with cardiologist on-call who recommended heparin infusion, aspirin and Lasix Hospitalist consulted for admission.  T wave inversion V3 V4 V5   Review of Systems: As per HPI otherwise all other systems on review of systems negative.   Assessment/Plan    NSTEMI (non-ST elevated myocardial infarction) (HCC) -Heparin infusion - Aspirin, metoprolol, atorvastatin - Cardiology consult    Acute on chronic diastolic CHF (congestive heart failure) (HCC) - IV Lasix - Metoprolol - Daily weights with intake and output monitoring - Metoprolol    AKI (acute kidney injury) (HCC) - Monitor renal function and avoid nephrotoxins    Anemia - Hemoglobin at 9.6 slightly below baseline - Continue to monitor    Hyperglycemia due to  type 2 diabetes mellitus (HCC) - Sliding scale insulin coverage    Pacemaker - Pacemaker check   DVT prophylaxis: Heparin infusion Code Status: full code  Family Communication:  none  Disposition Plan: Back to previous home environment Consults called: Cardiology consult Status:At the time of admission, it appears that the appropriate admission status for this patient is INPATIENT. This is judged to be reasonable and necessary in order to provide the required intensity of service to ensure the patient's safety given the presenting symptoms, physical exam findings, and initial radiographic and laboratory data in the context of their  Comorbid conditions.   Patient requires inpatient status due to high intensity of service, high risk for further deterioration and high frequency of surveillance required.   I certify that at the point of admission it is my clinical judgment that the patient will require inpatient hospital care spanning beyond 2 midnights     Physical Exam: Vitals:   12/27/20 0225 12/27/20 0230 12/27/20 0330 12/27/20 0430  BP:  (!) 148/65 135/62 (!) 167/71  Pulse:  76 76 78  Resp:  (!) 22 18 19   Temp:      TempSrc:      SpO2: (!) 87% 93% 94% 96%  Weight:      Height:       Constitutional: Alert, oriented x 3 . Not in any apparent distress HEENT:      Head: Normocephalic and atraumatic.         Eyes: PERLA, EOMI, Conjunctivae are normal. Sclera is non-icteric.       Mouth/Throat: Mucous membranes are moist.  Neck: Supple with no signs of meningismus. Cardiovascular: Regular rate and rhythm. No murmurs, gallops, or rubs. 2+ symmetrical distal pulses are present . No JVD. No  LE edema Respiratory: Respiratory effort normal .Lungs sounds clear bilaterally. No wheezes, crackles, or rhonchi.  Gastrointestinal: Soft, non tender, non distended. Positive bowel sounds.  Genitourinary: No CVA tenderness. Musculoskeletal: Nontender with normal range of motion in all  extremities. No cyanosis, or erythema of extremities. Neurologic:  Face is symmetric. Moving all extremities. No gross focal neurologic deficits . Skin: Skin is warm, dry.  No rash or ulcers Psychiatric: Mood and affect are appropriate     Past Medical History:  Diagnosis Date   Allergy    Ambulates with cane    Anemia    At high risk for falls    Cataract    removed both eyes   Chronic kidney disease 08/07/2016   Chronic renal failure, stage III   Depression    Esophageal dysphagia    Gastric ulcer    GERD (gastroesophageal reflux disease)    Hyperlipidemia    Hypertension    Hypothyroidism    IDDM (insulin dependent diabetes mellitus)    Ischemic colitis ([place])    Neuromuscular disorder ([place])    neuropathy   Osteoarthritis    Ulcer of esophagus     Past Surgical History:  Procedure Laterality Date   ABDOMINAL HYSTERECTOMY     CATARACT EXTRACTION, BILATERAL     CHOLECYSTECTOMY     COLONOSCOPY  2010   ESOPHAGEAL DILATION  08/07/2016   usually a couple times a year, last time 07/30/16   GALLBLADDER SURGERY     LEFT HEART CATH AND CORONARY ANGIOGRAPHY N/A 10/11/2020   Procedure: LEFT HEART CATH AND CORONARY ANGIOGRAPHY;  Surgeon: Nelva Bush, MD;  Location: [facility];  Service: Cardiovascular;  Laterality: N/A;   LUMBAR LAMINECTOMY/DECOMPRESSION MICRODISCECTOMY N/A 08/10/2016   Procedure: BILATERAL HEMILAMINECTOMY LUMBAR THREE-FOUR,LUMBAR FOUR-FIVE,LEFT LUMBAR FIVE-SACRAL ONE HEMILAMINECTOMY AND DECOMPRESSION;  Surgeon: Ashok Pall, MD;  Location: [facility];  Service: Neurosurgery;  Laterality: N/A;   PACEMAKER IMPLANT N/A 10/12/2020   Procedure: PACEMAKER IMPLANT;  Surgeon: Vickie Epley, MD;  Location: [facility];  Service: Cardiovascular;  Laterality: N/A;   TEMPORARY PACEMAKER N/A 10/11/2020   Procedure: TEMPORARY PACEMAKER;  Surgeon: Nelva Bush, MD;  Location: [facility];  Service: Cardiovascular;  Laterality: N/A;    THYROIDECTOMY     UPPER GASTROINTESTINAL ENDOSCOPY       reports that she has never smoked. She has never used smokeless tobacco. She reports that she does not drink alcohol and does not use drugs.  Allergies  Allergen Reactions   Pravachol [Pravastatin] Other (See Comments)    States she refused due to side effects    Family History  Problem Relation Age of Onset   Diabetes Mother    Arthritis Mother    Hyperlipidemia Mother    Mental illness Mother    Heart disease Father    Mental illness Sister    Arthritis Maternal Grandmother    Arthritis Maternal Grandfather    Colon cancer Neg Hx    Stomach cancer Neg Hx    Esophageal cancer Neg Hx    Rectal cancer Neg Hx    Colon polyps Neg Hx       Prior to Admission medications   Medication Sig Start Date End Date Taking? Authorizing Provider  Acetaminophen-Codeine 300-30 MG tablet Take 1 tablet by mouth every 12 (twelve) hours as  needed for pain (moderate to severe pain). 11/30/20   Burnard Hawthorne, FNP  albuterol (PROVENTIL) (2.5 MG/3ML) 0.083% nebulizer solution Take 3 mLs (2.5 mg total) by nebulization every 6 (six) hours as needed for wheezing or shortness of breath. 12/21/20   Burnard Hawthorne, FNP  albuterol (VENTOLIN HFA) 108 (90 Base) MCG/ACT inhaler Inhale 2 puffs into the lungs every 6 (six) hours as needed for wheezing or shortness of breath. 12/21/20   Burnard Hawthorne, FNP  amitriptyline (ELAVIL) 100 MG tablet Take 1 tablet (100 mg total) by mouth at bedtime. 12/07/20   Burnard Hawthorne, FNP  ascorbic acid (VITAMIN C) 500 MG tablet Take 500 mg by mouth daily.    [provider]  Cholecalciferol (VITAMIN D) 2000 units tablet Take 2,000 Units by mouth daily.     [provider]  Continuous Blood Gluc Sensor (FREESTYLE LIBRE 14 DAY SENSOR) MISC 1 Package by Does not apply route every 14 (fourteen) days. 09/03/19   Shamleffer, Melanie Crazier, MD  Cyanocobalamin (DODEX IJ) Inject as directed every 30  (thirty) days.    [provider]  docusate sodium (COLACE) 100 MG capsule Take 1 capsule (100 mg total) by mouth 2 (two) times daily. 03/31/16   Theodoro Grist, MD  doxycycline (VIBRAMYCIN) 100 MG capsule Take 1 capsule (100 mg total) by mouth 2 (two) times daily for 7 days. 12/26/20 01/02/21  Couture, Cortni S, PA-C  ezetimibe (ZETIA) 10 MG tablet Take 1 tablet (10 mg total) by mouth daily. 06/29/20   Burnard Hawthorne, FNP  ferrous sulfate 324 MG TBEC Take 324 mg by mouth every other day.    [provider]  fluticasone-salmeterol (ADVAIR) 100-50 MCG/ACT AEPB Inhale 1 puff into the lungs 2 (two) times daily. 12/22/20   Burnard Hawthorne, FNP  furosemide (LASIX) 20 MG tablet Take 10 mg by mouth daily.    [provider]  gabapentin (NEURONTIN) 100 MG capsule Take 2 capsules (200 mg total) by mouth 3 (three) times daily. 10/17/20   Jennye Boroughs, MD  glucagon 1 MG injection Inject 1 mg into the muscle once as needed for up to 1 dose. Hypoglycemia If she is not tolerating PO. 12/07/20   Arnett, Yvetta Coder, FNP  Insulin Glargine (LANTUS SOLOSTAR ) Inject 14 Units into the skin at bedtime.    [provider]  insulin lispro (HUMALOG KWIKPEN) 100 UNIT/ML KwikPen Inject 8 Units into the skin 3 (three) times daily. Max Daily 30 units 09/21/20   Shamleffer, Melanie Crazier, MD  Insulin Pen Needle 32G X 4 MM MISC 1 Device by Does not apply route in the morning, at noon, in the evening, and at bedtime. 11/13/19   Shamleffer, Melanie Crazier, MD  levothyroxine (SYNTHROID) 150 MCG tablet TAKE 1 TABLET DAILY 01/28/20   Burnard Hawthorne, FNP  lisinopril (ZESTRIL) 2.5 MG tablet Take 2.5 mg by mouth every other day. Patient not taking: Reported on 12/22/2020    [provider]  metoprolol tartrate (LOPRESSOR) 25 MG tablet Take 25 mg by mouth. 1/2 tablet in AM and 1 tablet in the evening    [provider]  Misc. Devices (BARIATRIC ROLLATOR) MISC Use as needed  07/31/19   Burnard Hawthorne, FNP  Multiple Vitamins-Minerals (PRESERVISION AREDS PO) Take 1 tablet by mouth daily.    [provider]  omeprazole (PRILOSEC) 40 MG capsule TAKE 1 CAPSULE TWICE A DAY 07/25/20   Milus Banister, MD  predniSONE (DELTASONE) 50 MG tablet  Take 1 tablet (50 mg total) by mouth daily for 5 days. 12/26/20 12/31/20  Couture, Cortni S, PA-C  pyridOXINE (VITAMIN B-6) 100 MG tablet Take 100 mg by mouth daily.    [provider]      Labs on Admission: I have personally reviewed following labs and imaging studies  CBC: Recent Labs  Lab 12/27/20 0014  WBC 11.2*  HGB 9.6*  HCT 29.7*  MCV 92.2  PLT [id number]   Basic Metabolic Panel: Recent Labs  Lab 12/27/20 0014  NA 131*  K 4.9  CL 100  CO2 22  GLUCOSE 424*  BUN 45*  CREATININE 1.31*  CALCIUM 8.5*   GFR: Estimated Creatinine Clearance: 24.6 mL/min (A) (by C-G formula based on SCr of 1.31 mg/dL (H)). Liver Function Tests: No results for input(s): AST, ALT, ALKPHOS, BILITOT, PROT, ALBUMIN in the last 168 hours. No results for input(s): LIPASE, AMYLASE in the last 168 hours. No results for input(s): AMMONIA in the last 168 hours. Coagulation Profile: Recent Labs  Lab 12/27/20 0358  INR 1.0   Cardiac Enzymes: No results for input(s): CKTOTAL, CKMB, CKMBINDEX, TROPONINI in the last 168 hours. BNP (last 3 results) No results for input(s): PROBNP in the last 8760 hours. HbA1C: No results for input(s): HGBA1C in the last 72 hours. CBG: No results for input(s): GLUCAP in the last 168 hours. Lipid Profile: No results for input(s): CHOL, HDL, LDLCALC, TRIG, CHOLHDL, LDLDIRECT in the last 72 hours. Thyroid Function Tests: No results for input(s): TSH, T4TOTAL, FREET4, T3FREE, THYROIDAB in the last 72 hours. Anemia Panel: No results for input(s): VITAMINB12, FOLATE, FERRITIN, TIBC, IRON, RETICCTPCT in the last 72 hours. Urine analysis:    Component Value Date/Time   COLORURINE YELLOW (A)  11/08/2020 1011   APPEARANCEUR HAZY (A) 11/08/2020 1011   LABSPEC 1.009 11/08/2020 1011   PHURINE 5.0 11/08/2020 [place] 11/08/2020 [place] 10/14/2019 1426   HGBUR NEGATIVE 11/08/2020 [place] 11/08/2020 1011   BILIRUBINUR neg 09/04/2018 0955   KETONESUR NEGATIVE 11/08/2020 1011   PROTEINUR NEGATIVE 11/08/2020 1011   UROBILINOGEN 0.2 10/14/2019 1426   NITRITE POSITIVE (A) 11/08/2020 1011   LEUKOCYTESUR LARGE (A) 11/08/2020 1011    Radiological Exams on Admission: DG Chest 2 View  Result Date: 12/27/2020 CLINICAL DATA:  Chest pain. EXAM: CHEST - 2 VIEW COMPARISON:  Chest radiograph dated 12/19/2020. FINDINGS: Diffuse interstitial coarsening, progressed since the prior radiograph and may represent mild edema. Atypical pneumonia is not excluded. Clinical correlation is recommended. There is eventration of the right hemidiaphragm. No pleural effusion pneumothorax. The cardiac silhouette is within limits. Atherosclerotic calcification of the aorta. Left pectoral pacemaker device. Degenerative changes of the spine. No acute osseous pathology. IMPRESSION: Diffuse interstitial coarsening may represent mild edema. Atypical pneumonia is not excluded. Electronically Signed   By: Anner Crete M.D.   On: 12/27/2020 00:39       Athena Masse MD [facility]   12/27/2020, 4:54 AM

## 2020-12-27 NOTE — Progress Notes (Signed)
ANTICOAGULATION CONSULT NOTE  Pharmacy Consult for heparin infusion Indication: ACS/STEMI  Patient Measurements: Heparin Dosing Weight: 60.8 kg  Labs: Recent Labs    12/27/20 0014 12/27/20 0242 12/27/20 0358 12/27/20 0703 12/27/20 1219  HGB 9.6*  --   --  10.1*  --   HCT 29.7*  --   --  31.4*  --   PLT 308  --   --  339  --   APTT  --   --  33  --   --   LABPROT  --   --  13.2  --   --   INR  --   --  1.0  --   --   HEPARINUNFRC  --   --   --   --  0.25*  CREATININE 1.31*  --   --  1.22*  --   TROPONINIHS 124* 803*  --   --   --      Estimated Creatinine Clearance: 26.5 mL/min (A) (by C-G formula based on SCr of 1.22 mg/dL (H)).   Medical History: Past Medical History:  Diagnosis Date   Allergy    Ambulates with cane    Anemia    At high risk for falls    Cataract    removed both eyes   Chronic kidney disease 08/07/2016   Chronic renal failure, stage III   Depression    Esophageal dysphagia    Gastric ulcer    GERD (gastroesophageal reflux disease)    Hyperlipidemia    Hypertension    Hypothyroidism    IDDM (insulin dependent diabetes mellitus)    Ischemic colitis (HCC)    Neuromuscular disorder (HCC)    neuropathy   Osteoarthritis    Ulcer of esophagus    Assessment: Pt is 87 yo female w/ recent pacemaker (~ 6 wks) presenting to ED c/o CP x 4 days, found with elevated Troponin I, trending up.  Goal of Therapy:  Heparin level 0.3-0.7 units/ml Monitor platelets by anticoagulation protocol: Yes   Plan:  --Heparin level is subtherapeutic --Heparin 1000 unit IV bolus and increase heparin infusion rate to 850 units/hr --Re-check HL 8 hours after rate change --Daily CBC per protocol --Per chart review, plan for IV heparin x 48 hours per cardiology. No plan for invasive intervention at this point  Tressie Ellis  12/27/2020 12:46 PM

## 2020-12-27 NOTE — ED Notes (Signed)
External catheter removed for transport. New wound dressing applied to sacral area with date marked. New brief I place due to incontinence when coughing. Rectal temp checked due to warmth of pts skin. MD messaged with rectal temp and request for cough medication.

## 2020-12-27 NOTE — ED Provider Notes (Addendum)
[facility]  ____________________________________________   Event Date/Time   First MD Initiated Contact with Patient 12/27/20 [phone]     (approximate)  I have reviewed the triage vital signs and the nursing notes.   HISTORY  Chief Complaint Chest Pain    HPI Amy Calhoun is a 87 y.o. 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  Patient and her daughter note that she has had a cough for several weeks.  Recently prescribed antibiotics for the cough.  She has had chest pain over the last 4 days.  Patient has somewhat difficult time characterizing the pain but describes it as sharp and in the center of her chest.  Does get worse when she walks around and is associated with dyspnea.  Denies radiation associated nausea vomiting or diaphoresis.  Denies chest pain currently.  Patient initially tells me that the pain is been constant over the 4 days but her daughter says that she complains about it at night and that they brought her in today because it was not going away.         Past Medical History:  Diagnosis Date   Allergy    Ambulates with cane    Anemia    At high risk for falls    Cataract    removed both eyes   Chronic kidney disease 08/07/2016   Chronic renal failure, stage III   Depression    Esophageal dysphagia    Gastric ulcer    GERD (gastroesophageal reflux disease)    Hyperlipidemia    Hypertension    Hypothyroidism    IDDM (insulin dependent diabetes mellitus)    Ischemic colitis (HCC)    Neuromuscular disorder (HCC)    neuropathy   Osteoarthritis    Ulcer of esophagus     Patient Active Problem List   Diagnosis Date Noted   NSTEMI (non-ST elevated myocardial infarction) (HCC) 12/27/2020   Acute CHF (congestive heart failure) (HCC) 12/27/2020   Pacemaker 12/27/2020   Bronchitis 12/21/2020    Pressure ulcer 12/21/2020   Polypharmacy 12/07/2020   Coronary artery disease involving native coronary artery of native heart without angina pectoris 11/18/2020   Acute on chronic diastolic CHF (congestive heart failure) (HCC) 10/15/2020   Malnutrition of moderate degree 10/12/2020   Heart block AV complete (HCC)    Symptomatic bradycardia 10/10/2020   Syncope 10/10/2020   Redness of skin 06/29/2020   Recurrent falls 06/06/2020   Abnormal gait 06/06/2020   Dizziness 06/06/2020   Right wrist pain 03/18/2020   B12 deficiency 03/11/2020   Hyperglycemia due to type 2 diabetes mellitus (HCC) 11/16/2019   Type 2 diabetes mellitus with stage 3a chronic kidney disease, with long-term current use of insulin (HCC) 11/16/2019   DM type 2 with diabetic peripheral neuropathy (HCC) 11/16/2019   Depression    Weakness of both lower extremities    Fatigue 02/13/2019   Head injury 02/04/2019   Acute hypoxemic respiratory failure due to COVID-19 (HCC) 01/28/2019   Gastroesophageal reflux disease without esophagitis    Insomnia 01/05/2019   Constipation 06/02/2018   Acute pain of left shoulder 05/23/2018   Diarrhea 05/23/2018   Pneumatosis coli 07/29/2017   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   Memory disturbance 03/27/2017   Recurrent urinary tract infection 10/17/2016   Lumbar stenosis with neurogenic claudication 08/10/2016   Acute respiratory failure with hypoxia (HCC) 07/12/2016   Anemia 07/06/2016  AKI (acute kidney injury) (HCC) 04/05/2016   Shortness of breath 03/31/2016   Osteoarthritis of spine with radiculopathy, lumbar region 03/16/2016   History of colitis 03/02/2016   Primary osteoarthritis of left hip 03/02/2016   CKD (chronic kidney disease) stage 3, GFR 30-59 ml/min (HCC) 02/21/2016   DM (diabetes mellitus), type 2 with renal complications (HCC) 01/19/2016   Hyperlipidemia associated with type 2 diabetes mellitus (HCC) 01/19/2016   Hypothyroidism 01/19/2016   Essential hypertension  01/19/2016   Chronic pain 01/19/2016   Esophageal dysmotility 12/21/2015    Past Surgical History:  Procedure Laterality Date   ABDOMINAL HYSTERECTOMY     CATARACT EXTRACTION, BILATERAL     CHOLECYSTECTOMY     COLONOSCOPY  2010   ESOPHAGEAL DILATION  08/07/2016   usually a couple times a year, last time 07/30/16   GALLBLADDER SURGERY     LEFT HEART CATH AND CORONARY ANGIOGRAPHY N/A 10/11/2020   Procedure: LEFT HEART CATH AND CORONARY ANGIOGRAPHY;  Surgeon: Yvonne Kendall, MD;  Location: [facility] INVASIVE CV LAB;  Service: Cardiovascular;  Laterality: N/A;   LUMBAR LAMINECTOMY/DECOMPRESSION MICRODISCECTOMY N/A 08/10/2016   Procedure: BILATERAL HEMILAMINECTOMY LUMBAR THREE-FOUR,LUMBAR FOUR-FIVE,LEFT LUMBAR FIVE-SACRAL ONE HEMILAMINECTOMY AND DECOMPRESSION;  Surgeon: Coletta Memos, MD;  Location: [facility] OR;  Service: Neurosurgery;  Laterality: N/A;   PACEMAKER IMPLANT N/A 10/12/2020   Procedure: PACEMAKER IMPLANT;  Surgeon: Lanier Prude, MD;  Location: [facility] INVASIVE CV LAB;  Service: Cardiovascular;  Laterality: N/A;   TEMPORARY PACEMAKER N/A 10/11/2020   Procedure: TEMPORARY PACEMAKER;  Surgeon: Yvonne Kendall, MD;  Location: [facility] INVASIVE CV LAB;  Service: Cardiovascular;  Laterality: N/A;   THYROIDECTOMY     UPPER GASTROINTESTINAL ENDOSCOPY      Prior to Admission medications   Medication Sig Start Date End Date Taking? Authorizing Provider  Acetaminophen-Codeine 300-30 MG tablet Take 1 tablet by mouth every 12 (twelve) hours as needed for pain (moderate to severe pain). 11/30/20   Allegra Grana, FNP  albuterol (PROVENTIL) (2.5 MG/3ML) 0.083% nebulizer solution Take 3 mLs (2.5 mg total) by nebulization every 6 (six) hours as needed for wheezing or shortness of breath. 12/21/20   Allegra Grana, FNP  albuterol (VENTOLIN HFA) 108 (90 Base) MCG/ACT inhaler Inhale 2 puffs into the lungs every 6 (six) hours as needed for wheezing or shortness of breath. 12/21/20   Allegra Grana, FNP   amitriptyline (ELAVIL) 100 MG tablet Take 1 tablet (100 mg total) by mouth at bedtime. 12/07/20   Allegra Grana, FNP  ascorbic acid (VITAMIN C) 500 MG tablet Take 500 mg by mouth daily.    [provider]  Cholecalciferol (VITAMIN D) 2000 units tablet Take 2,000 Units by mouth daily.     [provider]  Continuous Blood Gluc Sensor (FREESTYLE LIBRE 14 DAY SENSOR) MISC 1 Package by Does not apply route every 14 (fourteen) days. 09/03/19   Shamleffer, Konrad Dolores, MD  Cyanocobalamin (DODEX IJ) Inject as directed every 30 (thirty) days.    [provider]  docusate sodium (COLACE) 100 MG capsule Take 1 capsule (100 mg total) by mouth 2 (two) times daily. 03/31/16   Katharina Caper, MD  doxycycline (VIBRAMYCIN) 100 MG capsule Take 1 capsule (100 mg total) by mouth 2 (two) times daily for 7 days. 12/26/20 01/02/21  Couture, Cortni S, PA-C  ezetimibe (ZETIA) 10 MG tablet Take 1 tablet (10 mg total) by mouth daily. 06/29/20   Allegra Grana, FNP  ferrous sulfate 324 MG TBEC Take 324 mg by  mouth every other day.    [provider]  fluticasone-salmeterol (ADVAIR) 100-50 MCG/ACT AEPB Inhale 1 puff into the lungs 2 (two) times daily. 12/22/20   Burnard Hawthorne, FNP  furosemide (LASIX) 20 MG tablet Take 10 mg by mouth daily.    [provider]  gabapentin (NEURONTIN) 100 MG capsule Take 2 capsules (200 mg total) by mouth 3 (three) times daily. 10/17/20   Jennye Boroughs, MD  glucagon 1 MG injection Inject 1 mg into the muscle once as needed for up to 1 dose. Hypoglycemia If she is not tolerating PO. 12/07/20   Arnett, Yvetta Coder, FNP  Insulin Glargine (LANTUS SOLOSTAR [place]) Inject 14 Units into the skin at bedtime.    [provider]  insulin lispro (HUMALOG KWIKPEN) 100 UNIT/ML KwikPen Inject 8 Units into the skin 3 (three) times daily. Max Daily 30 units 09/21/20   Shamleffer, Melanie Crazier, MD  Insulin Pen Needle 32G X 4 MM MISC 1 Device by Does  not apply route in the morning, at noon, in the evening, and at bedtime. 11/13/19   Shamleffer, Melanie Crazier, MD  levothyroxine (SYNTHROID) 150 MCG tablet TAKE 1 TABLET DAILY 01/28/20   Burnard Hawthorne, FNP  lisinopril (ZESTRIL) 2.5 MG tablet Take 2.5 mg by mouth every other day. Patient not taking: Reported on 12/22/2020    [provider]  metoprolol tartrate (LOPRESSOR) 25 MG tablet Take 25 mg by mouth. 1/2 tablet in AM and 1 tablet in the evening    [provider]  Misc. Devices (BARIATRIC ROLLATOR) MISC Use as needed 07/31/19   Burnard Hawthorne, FNP  Multiple Vitamins-Minerals (PRESERVISION AREDS PO) Take 1 tablet by mouth daily.    [provider]  omeprazole (PRILOSEC) 40 MG capsule TAKE 1 CAPSULE TWICE A DAY 07/25/20   Milus Banister, MD  predniSONE (DELTASONE) 50 MG tablet Take 1 tablet (50 mg total) by mouth daily for 5 days. 12/26/20 12/31/20  Couture, Cortni S, PA-C  pyridOXINE (VITAMIN B-6) 100 MG tablet Take 100 mg by mouth daily.    [provider]    Allergies Pravachol [pravastatin]  Family History  Problem Relation Age of Onset   Diabetes Mother    Arthritis Mother    Hyperlipidemia Mother    Mental illness Mother    Heart disease Father    Mental illness Sister    Arthritis Maternal Grandmother    Arthritis Maternal Grandfather    Colon cancer Neg Hx    Stomach cancer Neg Hx    Esophageal cancer Neg Hx    Rectal cancer Neg Hx    Colon polyps Neg Hx     Social History Social History   Tobacco Use   Smoking status: Never   Smokeless tobacco: Never  Vaping Use   Vaping Use: Never used  Substance Use Topics   Alcohol use: No   Drug use: No    Review of Systems   Review of Systems  Constitutional:  Negative for chills and fever.  Respiratory:  Positive for cough, chest tightness and shortness of breath.   Cardiovascular:  Positive for chest pain. Negative for leg swelling.  Gastrointestinal:  Negative for  abdominal pain, nausea and vomiting.  All other systems reviewed and are negative.  Physical Exam Updated Vital Signs BP (!) 167/71   Pulse 78   Temp 98.2 F (36.8 C) (Oral)   Resp 19   Ht 5\' 4"  (1.626 m)   Wt 60.8 kg  SpO2 96%   BMI 23.00 kg/m   Physical Exam Vitals and nursing note reviewed.  Constitutional:      General: She is not in acute distress.    Appearance: Normal appearance.  HENT:     Head: Normocephalic and atraumatic.  Eyes:     General: No scleral icterus.    Conjunctiva/sclera: Conjunctivae normal.  Pulmonary:     Effort: Pulmonary effort is normal. No respiratory distress.     Breath sounds: No stridor.  Abdominal:     Palpations: Abdomen is soft.  Musculoskeletal:        General: No deformity or signs of injury.     Cervical back: Normal range of motion.     Right lower leg: Edema present.     Left lower leg: Edema present.     Comments: Mild lower extremity edema bilaterally  Skin:    General: Skin is dry.     Coloration: Skin is not jaundiced or pale.  Neurological:     General: No focal deficit present.     Mental Status: She is alert and oriented to person, place, and time. Mental status is at baseline.  Psychiatric:        Mood and Affect: Mood normal.        Behavior: Behavior normal.     LABS (all labs ordered are listed, but only abnormal results are displayed)  Labs Reviewed  CBC - Abnormal; Notable for the following components:      Result Value   WBC 11.2 (*)    RBC 3.22 (*)    Hemoglobin 9.6 (*)    HCT 29.7 (*)    All other components within normal limits  BASIC METABOLIC PANEL - Abnormal; Notable for the following components:   Sodium 131 (*)    Glucose, Bld 424 (*)    BUN 45 (*)    Creatinine, Ser 1.31 (*)    Calcium 8.5 (*)    GFR, Estimated 39 (*)    All other components within normal limits  BRAIN NATRIURETIC PEPTIDE - Abnormal; Notable for the following components:   B Natriuretic Peptide 1,034.4 (*)    All  other components within normal limits  TROPONIN I (HIGH SENSITIVITY) - Abnormal; Notable for the following components:   Troponin I (High Sensitivity) 124 (*)    All other components within normal limits  TROPONIN I (HIGH SENSITIVITY) - Abnormal; Notable for the following components:   Troponin I (High Sensitivity) 803 (*)    All other components within normal limits  RESP PANEL BY RT-PCR (FLU A&B, COVID) ARPGX2  APTT  PROTIME-INR   ____________________________________________  EKG  Left bundle branch block, left axis deviation, there are inverted T waves in V3 V4 V5 which are new compared to prior EKG ____________________________________________  RADIOLOGY I, Madelin Headings, personally viewed and evaluated these images (plain radiographs) as part of my medical decision making, as well as reviewing the written report by the radiologist.  ED MD interpretation: I reviewed the chest x-ray which shows some diffuse interstitial edema versus atypical pneumonia    ____________________________________________   PROCEDURES  Procedure(s) performed (including Critical Care):  .Critical Care Performed by: Rada Hay, MD Authorized by: Rada Hay, MD   Critical care provider statement:    Critical care time (minutes):  30   Critical care was time spent personally by me on the following activities:  Development of treatment plan with patient or surrogate, discussions with consultants, evaluation of patient's response  to treatment, examination of patient, ordering and review of laboratory studies, ordering and review of radiographic studies, ordering and performing treatments and interventions, pulse oximetry, re-evaluation of patient's condition and review of old charts   ____________________________________________   INITIAL IMPRESSION / ASSESSMENT AND PLAN / ED COURSE     The patient is a 87-year-old female presenting with 4 days of chest pain.  Has had cough for  the last several weeks and now new chest pain.  She has no history of prior MI but does have history of high degree AV block status post pacemaker and single-vessel disease on a cath.  Her pain is somewhat atypical and that it is sharp and worse at night but history is somewhat limited because the patient has difficulty describing it.  She does describe what sounds like exertional pain at times.  Currently she is pain-free.  Vital signs within normal limits.  Her chest x-ray does show some increased interstitial markings, question whether this is pulmonary edema versus infection.  He is anemic with a hemoglobin of 9.6 with his overall minimally changed from prior of 10.1.  Heart initial troponin is 100.  On repeat it is 800.  Patient denies active chest pain at the time.  Her EKG shows a left bundle but there are new T wave inversions in V3 through V5.  Does not meet S[place] criteria for STEMI activation.  We will start heparin given aspirin.  She will need admission.  Spoke with Dr.Bensimhon with [facility] who recommends giving her some IV Lasix and agrees with the plan.  We will see her in the morning.      ____________________________________________   FINAL CLINICAL IMPRESSION(S) / ED DIAGNOSES  Final diagnoses:  NSTEMI (non-ST elevated myocardial infarction) [facility])     ED Discharge Orders     None        Note:  This document was prepared using Dragon voice recognition software and may include unintentional dictation errors.    Rada Hay, MD 12/27/20 0421    Rada Hay, MD 12/27/20 [id number]    Rada Hay, MD 12/27/20 [phone]

## 2020-12-27 NOTE — Consult Note (Signed)
Cardiology Consult    Patient ID: Amy Calhoun MRN: [id number], DOB/AGE: [birth year]   Admit date: 12/27/2020 Date of Consult: 12/27/2020  Primary Physician: Burnard Hawthorne, FNP Primary Cardiologist: Nelva Bush, MD Requesting Provider: Chauncey Cruel. Kurtis Bushman, MD  Patient Profile    Amy Calhoun is a 87 y.o. female with a history of CAD, heart block s/p PPM (09/2020), IDDM, peripheral neuropathy, HTN, orthostatic hypotension, HL, hypothyroidism, and GERD, who is being seen today for the evaluation of NSTEMI at the request of Dr. Kurtis Bushman.  Past Medical History   Past Medical History:  Diagnosis Date   Allergy    Ambulates with cane    Anemia    At high risk for falls    Cataract    removed both eyes   Chronic kidney disease 08/07/2016   Chronic renal failure, stage III   Depression    Esophageal dysphagia    Gastric ulcer    GERD (gastroesophageal reflux disease)    Hyperlipidemia    Hypertension    Hypothyroidism    IDDM (insulin dependent diabetes mellitus)    Ischemic colitis ([place])    Neuromuscular disorder (HCC)    neuropathy   Osteoarthritis    Ulcer of esophagus     Past Surgical History:  Procedure Laterality Date   ABDOMINAL HYSTERECTOMY     CATARACT EXTRACTION, BILATERAL     CHOLECYSTECTOMY     COLONOSCOPY  2010   ESOPHAGEAL DILATION  08/07/2016   usually a couple times a year, last time 07/30/16   GALLBLADDER SURGERY     LEFT HEART CATH AND CORONARY ANGIOGRAPHY N/A 10/11/2020   Procedure: LEFT HEART CATH AND CORONARY ANGIOGRAPHY;  Surgeon: Nelva Bush, MD;  Location: [facility];  Service: Cardiovascular;  Laterality: N/A;   LUMBAR LAMINECTOMY/DECOMPRESSION MICRODISCECTOMY N/A 08/10/2016   Procedure: BILATERAL HEMILAMINECTOMY LUMBAR THREE-FOUR,LUMBAR FOUR-FIVE,LEFT LUMBAR FIVE-SACRAL ONE HEMILAMINECTOMY AND DECOMPRESSION;  Surgeon: Ashok Pall, MD;  Location: [facility];  Service: Neurosurgery;  Laterality: N/A;   PACEMAKER IMPLANT N/A  10/12/2020   Procedure: PACEMAKER IMPLANT;  Surgeon: Vickie Epley, MD;  Location: [facility];  Service: Cardiovascular;  Laterality: N/A;   TEMPORARY PACEMAKER N/A 10/11/2020   Procedure: TEMPORARY PACEMAKER;  Surgeon: Nelva Bush, MD;  Location: [facility];  Service: Cardiovascular;  Laterality: N/A;   THYROIDECTOMY     UPPER GASTROINTESTINAL ENDOSCOPY       Allergies  Allergies  Allergen Reactions   Pravachol [Pravastatin] Other (See Comments)    States she refused due to side effects    History of Present Illness    87 y.o. female with a history of CAD, heart block s/p PPM (09/2020), IDDM, peripheral neuropathy, HTN, orthostatic hypotension, HL, hypothyroidism, and GERD.  She was admitted in late Sept 2022, secondary to syncope in the setting of bradycardia.  She was found to have 2:1 AV block in the ED, in the absence of AVN blocking agents.  Echo showed nl EF.  Initially, temp wire placed.  Diagnostic cath revealed severe RCA dzs w/ L  R collaterals w/ rec for med rx.  She underwent PPM.  Hosp course complicated by AKI req IVF and subsequently CHF req diuresis.  She was subsequently d/c'd home but in the setting of recurrent falls and ED visits, she was transferred to rehab.  She was recently seen in f/u on 12/5, @ which time she reported a cough and resp infection for which she was receiving zithromax.  CXR that day  suggested bronchitis.  Labs showed mild hyperkalemia and lisinopril was d/c'd.  Amy Calhoun reports 4 days of constant chest pain that ultimately prompted her daughter to call EMS last night.  However, I was able to obtain further information from her daughter by phone, and it sounds as though Amy Calhoun has been dealing with respiratory symptoms since Thanksgiving (approximately 2 weeks).  She initially had a cough and has been treated with outpatient antibiotics.  For the last several days, she has had chest pain, especially at night, after coughing.   Her daughter would give her Tylenol 3 with resolution of the chest pain after about 20 to 30 minutes.  However, yesterday the chest pain lasted longer and did not resolve with Tylenol 3 or ibuprofen.  Her daughter therefore called EMS.  Amy Calhoun denies sick contacts though her son-in-law was recently sick as well with a viral illness.  She has been short of breath and fairly sedentary.  Her daughter also makes note of a pressure ulcer that developed while Amy Calhoun was at rehab following her previous hospitalization in September.  At this time, Amy Calhoun is without chest pain, having received sublingual nitroglycerin this morning.  She is complaining of shortness of breath and was noted to have significant coughing after trying to eat this morning.  Inpatient Medications     [START ON 12/28/2020] aspirin EC  81 mg Oral Daily   furosemide  40 mg Intravenous Q12H   insulin aspart  0-15 Units Subcutaneous TID WC   insulin aspart  0-5 Units Subcutaneous QHS   insulin glargine-yfgn  7 Units Subcutaneous Daily   metoprolol tartrate  12.5 mg Oral BID    Family History    Family History  Problem Relation Age of Onset   Diabetes Mother    Arthritis Mother    Hyperlipidemia Mother    Mental illness Mother    Heart disease Father    Mental illness Sister    Arthritis Maternal Grandmother    Arthritis Maternal Grandfather    Colon cancer Neg Hx    Stomach cancer Neg Hx    Esophageal cancer Neg Hx    Rectal cancer Neg Hx    Colon polyps Neg Hx    She indicated that her mother is deceased. She indicated that her father is deceased. She indicated that her sister is deceased. She indicated that the status of her maternal grandmother is unknown. She indicated that the status of her maternal grandfather is unknown. She indicated that the status of her neg hx is unknown.   Social History    Social History   Tobacco Use   Smoking status: Never   Smokeless tobacco: Never  Vaping Use   Vaping  Use: Never used  Substance Use Topics   Alcohol use: No   Drug use: No      Review of Systems    General:  No chills, fever, night sweats or weight changes.  Cardiovascular: Atypical chest pain as well as shortness of breath over the last several days noted Dermatological: No rash, lesions/masses Respiratory: Cough for the last 2 weeks noted. Urologic: No hematuria, dysuria Abdominal:   No nausea, vomiting, diarrhea, bright red blood per rectum, melena, or hematemesis Neurologic:  No visual changes, wkns, changes in mental status. All other systems reviewed and are otherwise negative except as noted above.  Physical Exam    Blood pressure (!) 159/71, pulse 81, temperature 98.2 F (36.8 C), temperature source Oral, resp. rate 19,  height 5\' 4"  (1.626 m), weight 60.8 kg, SpO2 94 %.  General: Elderly woman lying in bed. Psych: Normal affect. Neuro: Alert and oriented X 3. Moves all extremities spontaneously. HEENT: Normal  Neck: Supple without bruits or JVD. Lungs: Normal work of breathing.  Diffuse rhonchi and diminished breath sounds at the left lung base noted. Heart: Distant heart sounds, largely obscured by lung sounds.  Regular rate and rhythm without obvious murmurs. Abdomen: Soft, non-tender, non-distended, BS + x 4.  Extremities: No clubbing, cyanosis or edema. DP/PT2+, Radials 2+ and equal bilaterally.  Labs    Cardiac Enzymes Recent Labs  Lab 12/27/20 0014 12/27/20 0242  TROPONINIHS 124* 803*      Lab Results  Component Value Date   WBC 11.2 (H) 12/27/2020   HGB 10.1 (L) 12/27/2020   HCT 31.4 (L) 12/27/2020   MCV 91.3 12/27/2020   PLT 339 12/27/2020    Recent Labs  Lab 12/27/20 0703  NA 132*  K 4.7  CL 98  CO2 25  BUN 46*  CREATININE 1.22*  CALCIUM 8.7*  GLUCOSE 417*   Lab Results  Component Value Date   CHOL 168 11/17/2020   HDL 59 11/17/2020   LDLCALC 92 11/17/2020   TRIG 91 11/17/2020   Lab Results  Component Value Date   DDIMER 0.70  (H) 10/10/2020     Radiology Studies    DG Chest 2 View  Result Date: 12/27/2020 CLINICAL DATA:  Chest pain. EXAM: CHEST - 2 VIEW COMPARISON:  Chest radiograph dated 12/19/2020. FINDINGS: Diffuse interstitial coarsening, progressed since the prior radiograph and may represent mild edema. Atypical pneumonia is not excluded. Clinical correlation is recommended. There is eventration of the right hemidiaphragm. No pleural effusion pneumothorax. The cardiac silhouette is within limits. Atherosclerotic calcification of the aorta. Left pectoral pacemaker device. Degenerative changes of the spine. No acute osseous pathology. IMPRESSION: Diffuse interstitial coarsening may represent mild edema. Atypical pneumonia is not excluded. Electronically Signed   By: 14/05/2020 M.D.   On: 12/27/2020 00:39   DG Chest 2 View  Result Date: 12/20/2020 CLINICAL DATA:  Shortness of breath and cough. Audible crackles in the lungs. EXAM: CHEST - 2 VIEW COMPARISON:  12/07/2020 FINDINGS: Dual lead pacemaker with leads in the region of the right atrium and right ventricle. Bronchial thickening consistent with bronchitis. No consolidation, lobar collapse or effusion. No acute bone finding. IMPRESSION: Bronchitis pattern.  No consolidation or collapse. Electronically Signed   By: 12/09/2020 M.D.   On: 12/20/2020 15:46   DG Chest 2 View  Result Date: 12/08/2020 CLINICAL DATA:  87 year old female with a history of choking EXAM: CHEST - 2 VIEW COMPARISON:  11/05/2020 FINDINGS: Cardiomediastinal silhouette unchanged in size and contour. No evidence of central vascular congestion. No interlobular septal thickening. Unchanged cardiac pacing device with 2 leads in place. Asymmetric elevation the right hemidiaphragm unchanged. No pneumothorax or pleural effusion. Coarsened interstitial markings, with no confluent airspace disease. No acute displaced fracture. Degenerative changes of the spine. Chronic deformity of left-sided ribs  IMPRESSION: Negative for acute cardiopulmonary disease Electronically Signed   By: 11/07/2020 D.O.   On: 12/08/2020 12:04   DG Chest Port 1 View  Result Date: 12/27/2020 CLINICAL DATA:  Shortness of breath, chest pain EXAM: PORTABLE CHEST 1 VIEW COMPARISON:  Radiograph 12/27/2020, chest CT 11/05/2020 FINDINGS: Unchanged cardiomediastinal silhouette with dual chamber pacemaker. Markedly increased interstitial and airspace disease left greater than right. Probable small left pleural effusion. No visible pneumothorax. IMPRESSION: Markedly  increased interstitial and airspace disease, left greater than right, favored to be severe pulmonary edema. Electronically Signed   By: Maurine Simmering M.D.   On: 12/27/2020 11:17    ECG & Cardiac Imaging    Normal sinus rhythm with ventricular pacing.- personally reviewed.  LHC (10/11/2020): Severe single-vessel coronary artery disease with diffuse disease involving relatively small RCA extending from the ostium through the mid/distal vessel of up to 90%.  Mild-moderate disease is also noted in the left coronary artery. Mildly elevated left ventricular filling pressure (LVEDP 15-20 mmHg). Successful placement of 30F temporary transvenous pacing wire via the right internal jugular vein.  TTE (10/11/2020):  1. Left ventricular ejection fraction, by estimation, is 60 to 65%. The  left ventricle has normal function. Left ventricular endocardial border  not optimally defined to evaluate regional wall motion. There is mild left  ventricular hypertrophy. Left  ventricular diastolic parameters are consistent with Grade I diastolic  dysfunction (impaired relaxation). Elevated left atrial pressure.   2. Right ventricular systolic function is normal. The right ventricular  size is normal. Tricuspid regurgitation signal is inadequate for assessing  PA pressure.   3. Left atrial size was mildly dilated.   4. The mitral valve is degenerative. Trivial mitral valve  regurgitation.  No evidence of mitral stenosis. Moderate to severe mitral annular  calcification.   5. The aortic valve is tricuspid. There is mild calcification of the  aortic valve. There is mild thickening of the aortic valve. Aortic valve  regurgitation is mild. Mild to moderate aortic valve  sclerosis/calcification is present, without any evidence  of aortic stenosis.   Assessment & Plan    NSTEMI: Patient presents with several day history of atypical chest pain preceded by what appears to be a respiratory illness with cough and shortness of breath.  EKG is noninterpretable due to ventricular pacing, though it does not meet Sgarbossa criteria.  High-sensitivity troponin I is elevated, increasing from 124-803 this morning.  Catheterization in September showed severe diffuse RCA disease as well as mild-moderate LAD and circumflex disease.  RCA is not well suited to intervention given the extent of disease and its relatively small size.  Question if elevated troponin may represent supply-demand mismatch.  Currently, Amy Calhoun is chest pain-free. -Continue 48 hours of IV heparin. -Consider discharging on dual antiplatelet therapy if no bleeding or need for invasive procedures during this hospitalization. -Repeat echocardiogram pending. -Would favor deferring catheterization at this time given concern for developing aspiration pneumonia as well as known severe RCA disease not well suited to PCI. -Defer statin as patient has previously declined this due to side effects. -Continue low-dose metoprolol.  Shortness of breath and acute respiratory failure with hypoxia: Considerations include recent infection (question viral) versus aspiration pneumonia with significant worsening of left lung opacity on repeat chest radiograph this morning.  An element of heart failure may also be contributing, though Amy Calhoun does not appear edematous on exam.  BNP was noted to be elevated this morning. -Consider  empiric antibiotics given concern for aspiration as well as developing left lung opacity. -Agree with diuresis, as you are doing. -Follow-up echocardiogram. -Consider speech evaluation to assess for aspiration.  Complete heart block: The patient is appropriately atrially sensed and ventricularly paced.  Signed, Nelva Bush, MD 12/27/2020, 11:33 AM  For questions or updates, please contact   Please consult [email or web address] for contact info under [facility].

## 2020-12-27 NOTE — Evaluation (Signed)
Clinical/Bedside Swallow Evaluation Patient Details  Name: Amy Calhoun MRN: [id number] Date of Birth: Dec 23, 1928  Today's Date: 12/27/2020 Time: SLP Start Time (ACUTE ONLY): 52 SLP Stop Time (ACUTE ONLY): 1300 SLP Time Calculation (min) (ACUTE ONLY): 50 min  Past Medical History:  Past Medical History:  Diagnosis Date   Allergy    Ambulates with cane    Anemia    At high risk for falls    Cataract    removed both eyes   Chronic kidney disease 08/07/2016   Chronic renal failure, stage III   Depression    Esophageal dysphagia    Gastric ulcer    GERD (gastroesophageal reflux disease)    Hyperlipidemia    Hypertension    Hypothyroidism    IDDM (insulin dependent diabetes mellitus)    Ischemic colitis ([place])    Neuromuscular disorder ([place])    neuropathy   Osteoarthritis    Ulcer of esophagus    Past Surgical History:  Past Surgical History:  Procedure Laterality Date   ABDOMINAL HYSTERECTOMY     CATARACT EXTRACTION, BILATERAL     CHOLECYSTECTOMY     COLONOSCOPY  2010   ESOPHAGEAL DILATION  08/07/2016   usually a couple times a year, last time 07/30/16   GALLBLADDER SURGERY     LEFT HEART CATH AND CORONARY ANGIOGRAPHY N/A 10/11/2020   Procedure: LEFT HEART CATH AND CORONARY ANGIOGRAPHY;  Surgeon: Nelva Bush, MD;  Location: [facility];  Service: Cardiovascular;  Laterality: N/A;   LUMBAR LAMINECTOMY/DECOMPRESSION MICRODISCECTOMY N/A 08/10/2016   Procedure: BILATERAL HEMILAMINECTOMY LUMBAR THREE-FOUR,LUMBAR FOUR-FIVE,LEFT LUMBAR FIVE-SACRAL ONE HEMILAMINECTOMY AND DECOMPRESSION;  Surgeon: Ashok Pall, MD;  Location: [facility];  Service: Neurosurgery;  Laterality: N/A;   PACEMAKER IMPLANT N/A 10/12/2020   Procedure: PACEMAKER IMPLANT;  Surgeon: Vickie Epley, MD;  Location: [facility];  Service: Cardiovascular;  Laterality: N/A;   TEMPORARY PACEMAKER N/A 10/11/2020   Procedure: TEMPORARY PACEMAKER;  Surgeon: Nelva Bush, MD;  Location: [facility];  Service: Cardiovascular;  Laterality: N/A;   THYROIDECTOMY     UPPER GASTROINTESTINAL ENDOSCOPY     HPI:  Pt is a 87 y.o. 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  Patient reports for the past couple weeks she had a cough but without fever or chills and then developed chest pain in the past few days.  The pain is of moderate intensity nonradiating and is constant.  No aggravating or alleviating factors except for exertion.  Denies nausea, vomiting, diaphoresis and palpitations.  Pt admitted for NSTEMI w/ Acute on chronic diastolic CHF and AKI.   Previous HEAD CT 10/2020: "Global  parenchymal volume loss. Periventricular white matter hypodensities  consistent with sequela of chronic microvascular ischemic disease.".  CXR: Markedly increased interstitial and airspace disease, left greater  than right, favored to be severe pulmonary edema.    Assessment / Plan / Recommendation  Clinical Impression  Pt appears to present w/ pharyngeal phase dysphagia w/ delayed, overt clinical s/s of aspiration noted w/ thin liquids; suspect neuromuscular deficits w/ impact from both medical and cognitive issues affecting her overall  swallowing safety and engagement during po tasks -- this increases risk for aspiration and pulmonary decline. Pt required min+ verbal/visual cues during bolus presentation, follow through w/ tasks, and self-feeding support.  Pt consumed trials of single ice chips, Nectar liquids via Cup, purees, and soft solids w/ no immediate, overt clinical s/s of aspiration noted during trials. However w/ tirals of thin liquids, delayed coughing was noted during and after evaluation. Pt has baseline congested coughing d/t declined pulmonary status, however, this coughing appeared related to the  swallowing. Oral phase was c/b grossly adequate bolus management and oral clearing of all boluses given. She required min increased Time for mastication and A-P transfer w/ increased textured, solid foods -- unsure if related to overall weakness, and decreased attention/awareness. Oral clearing achieved w/ all trials. OM exam was functional/adequate w/ no unilateral weakness was noted. Speech clear w/ low volume.     Recommend dysphagia level 3 diet(cut meats, moistened) w/ Nectar consistency liquids; aspiration precautions; Pills Whole vs Crushed in puree for safety; Tray setup at meals w/ feeding support and supervision at meals as needed, reduce Distractions during meals. NSG/MD updated. SLP Visit Diagnosis: Dysphagia, oropharyngeal phase (R13.12)    Aspiration Risk  Mild aspiration risk;Moderate aspiration risk;Risk for inadequate nutrition/hydration    Diet Recommendation   dysphagia level 3 diet(cut meats, moistened) w/ Nectar consistency liquids; aspiration precautions. Tray setup at meals w/ feeding support and supervision at meals as needed, reduce Distractions during meals.  Medication Administration: Whole meds with puree (vs need to Crush)    Other  Recommendations Recommended Consults:  (Dietician; Palliative Care consult for GOC) Oral Care Recommendations: Oral care BID;Oral care before and after PO;Staff/trained caregiver to provide oral care Other Recommendations: Order thickener from pharmacy;Have oral suction available;Prohibited food (jello, ice cream, thin soups);Remove water pitcher    Recommendations for follow up therapy are one component of a multi-disciplinary discharge planning process, led by the attending physician.  Recommendations may be updated based on patient status, additional functional criteria and insurance authorization.  Follow up Recommendations Skilled nursing-short term rehab (<3 hours/day)      Assistance Recommended at Discharge Intermittent  Supervision/Assistance  Functional Status Assessment Patient has had a recent decline in their functional status and demonstrates the ability to make significant improvements in function in a reasonable and predictable amount of time. (TBD)  Frequency and Duration min 2x/week  2 weeks       Prognosis Prognosis for Safe Diet Advancement: Fair Barriers to Reach Goals: Cognitive deficits;Time post onset;Severity of deficits (advanced age)      Swallow Study   General Date of Onset: 12/26/20 HPI: Pt is a 87 y.o. 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  Patient reports for the past couple weeks she had a cough but without fever or chills and then developed chest pain in the past few days.  The pain is of moderate intensity nonradiating and is constant.  No aggravating or alleviating factors except for exertion.  Denies nausea, vomiting, diaphoresis and palpitations.  Pt admitted for NSTEMI w/ Acute on chronic diastolic CHF and AKI.  Previous HEAD CT 10/2020: "Global  parenchymal volume loss. Periventricular white matter hypodensities  consistent with sequela of chronic microvascular ischemic disease.".  CXR: Markedly increased interstitial and airspace disease, left greater  than right, favored to be severe pulmonary edema. Type of Study: Bedside Swallow Evaluation Previous Swallow Assessment: none per chart Diet Prior to this Study: Regular;Thin liquids Temperature Spikes Noted: No (wbc 11.2) Respiratory  Status: Nasal cannula (4L) History of Recent Intubation: No Behavior/Cognition: Alert;Cooperative;Pleasant mood;Distractible;Requires cueing Oral Cavity Assessment: Within Functional Limits Oral Care Completed by SLP: Yes Oral Cavity - Dentition: Missing dentition Vision: Functional for  self-feeding Self-Feeding Abilities: Able to feed self;Needs assist;Needs set up Patient Positioning: Upright in bed (needed positioning) Baseline Vocal Quality: Low vocal intensity Volitional Cough: Congested (Fair) Volitional Swallow: Able to elicit    Oral/Motor/Sensory Function Overall Oral Motor/Sensory Function: Within functional limits   Ice Chips Ice chips: Within functional limits Presentation: Spoon (fed; 4 trials)   Thin Liquid Thin Liquid: Impaired Presentation: Cup;Self Fed (8 trials) Oral Phase Impairments:  (none) Oral Phase Functional Implications:  (none) Pharyngeal  Phase Impairments: Cough - Delayed (x1/8; then again post eval upon leaving the room) Other Comments: appears suspicious and of increased risk    Nectar Thick Nectar Thick Liquid: Within functional limits Presentation: Cup;Self Fed;Spoon (~2-3 ozs)   Honey Thick Honey Thick Liquid: Not tested   Puree Puree: Within functional limits Presentation: Self Fed;Spoon (supported; 4 ozs)   Solid     Solid: Within functional limits (grossly -- took her time) Presentation: Self Fed;Spoon (8 trials)         Orinda Kenner, MS, CCC-SLP Speech Language Pathologist Rehab Services [phone] [facility] 12/27/2020,4:02 PM

## 2020-12-27 NOTE — ED Notes (Signed)
Echo being performed at the bedside 

## 2020-12-27 NOTE — ED Notes (Signed)
Report received from Matthew, RN  

## 2020-12-27 NOTE — Progress Notes (Addendum)
Patient ID: Amy Calhoun, female   DOB: November 23, 1928, 87 y.o.   MRN: [id number] Is a no charge note as patient was admitted this AM.  Patient seen and examined.  H&P reviewed Alexzandra Tahara Ruffini is a 87 y.o. 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  Patient reports for the past couple weeks she had a cough but without fever or chills and then developed chest pain in the past few days.  The pain is of moderate intensity nonradiating and is constant.  No aggravating or alleviating factors except for exertion.  Denies nausea, vomiting, diaphoresis and palpitations. Troponin [id number], BNP 1034  Calm Rhonchorus bs, no wheezing Reg s1/s2  Soft benign  +generalized edema b/l  A/P Continue lasix Obtain cxr, as worried for pna. Will start iv abx  if evidence of pna. Cardiology consulted. Spoke to daughter and patient about CODE STATUS, they both wanted DNR instead of full code.  Orders placed.

## 2020-12-27 NOTE — Progress Notes (Signed)
Inpatient Diabetes Program Recommendations  AACE/ADA: New Consensus Statement on Inpatient Glycemic Control (2015)  Target Ranges:  Prepandial:   less than 140 mg/dL      Peak postprandial:   less than 180 mg/dL (1-2 hours)      Critically ill patients:  140 - 180 mg/dL    Latest Reference Range & Units 12/27/20 07:17  Glucose-Capillary 70 - 99 mg/dL 629 (H)  15 units Novolog     Admit with: NSTEMI/ Acute on chronic diastolic CHF/ Hyperglycemia  History: DM, CHF  Home DM Meds: Freestyle Libre CGM       Humalog 8 units TID       Humalog 1 unit for every 60 mg/dl above target CBG of 528       Lantus 14 units QHS  Current Orders: Novolog Moderate Correction Scale/ SSI (0-15 units) TID AC + HS     Endocrinologist: Dr. Lonzo Cloud with [facility]--last seen 12/12/2020--No changes made to home insulin regimen at that visit    MD- Please consider:  1. Start Semglee 14 units Daily (home dose)  2. Reduce Novolog SSi to the 0-9 unit scale--May need to increase frequency to Q4 hours if pt remains NPO today  Once eating, pt will likely require Novolog Meal Coverage as well     --Will follow patient during hospitalization--  Ambrose Finland RN, MSN, CDE Diabetes Coordinator Inpatient Glycemic Control Team Team Pager: [phone] (8a-5p)

## 2020-12-27 NOTE — Progress Notes (Signed)
ANTICOAGULATION CONSULT NOTE  Pharmacy Consult for heparin infusion Indication: ACS/STEMI  Allergies  Allergen Reactions   Pravachol [Pravastatin] Other (See Comments)    States she refused due to side effects    Patient Measurements: Height: 5\' 4"  (162.6 cm) Weight: 60.8 kg (134 lb) IBW/kg (Calculated) : 54.7 Heparin Dosing Weight: 60.8 kg  Vital Signs: Temp: 98.2 F (36.8 C) (12/13 0002) Temp Source: Oral (12/13 0002) BP: 135/62 (12/13 0330) Pulse Rate: 76 (12/13 0330)  Labs: Recent Labs    12/27/20 0014 12/27/20 0242  HGB 9.6*  --   HCT 29.7*  --   PLT 308  --   CREATININE 1.31*  --   TROPONINIHS 124* 803*    Estimated Creatinine Clearance: 24.6 mL/min (A) (by C-G formula based on SCr of 1.31 mg/dL (H)).   Medical History: Past Medical History:  Diagnosis Date   Allergy    Ambulates with cane    Anemia    At high risk for falls    Cataract    removed both eyes   Chronic kidney disease 08/07/2016   Chronic renal failure, stage III   Depression    Esophageal dysphagia    Gastric ulcer    GERD (gastroesophageal reflux disease)    Hyperlipidemia    Hypertension    Hypothyroidism    IDDM (insulin dependent diabetes mellitus)    Ischemic colitis (HCC)    Neuromuscular disorder (HCC)    neuropathy   Osteoarthritis    Ulcer of esophagus    Assessment: Pt is 87 yo female w/ recent pacemaker (~ 6 wks) presenting to ED c/o CP x 4 days, found with elevated Troponin I, trending up.  Goal of Therapy:  Heparin level 0.3-0.7 units/ml Monitor platelets by anticoagulation protocol: Yes   Plan:  Bolus 3600 units x 1 Start heparin infusion at 750 units/hr Check HL in 8 hr after start of infusion CBC daily while on heparin.  95, PharmD, [facility] 12/27/2020 3:58 AM

## 2020-12-27 NOTE — ED Notes (Signed)
Patient is requesting something for a cough, will message admitting MD

## 2020-12-27 NOTE — Telephone Encounter (Signed)
Noted-currently admitted.

## 2020-12-28 ENCOUNTER — Other Ambulatory Visit: Payer: Medicare HMO

## 2020-12-28 ENCOUNTER — Other Ambulatory Visit: Payer: Self-pay

## 2020-12-28 DIAGNOSIS — I5032 Chronic diastolic (congestive) heart failure: Secondary | ICD-10-CM

## 2020-12-28 DIAGNOSIS — R0602 Shortness of breath: Secondary | ICD-10-CM

## 2020-12-28 DIAGNOSIS — L8990 Pressure ulcer of unspecified site, unspecified stage: Secondary | ICD-10-CM | POA: Insufficient documentation

## 2020-12-28 DIAGNOSIS — I5030 Unspecified diastolic (congestive) heart failure: Secondary | ICD-10-CM

## 2020-12-28 DIAGNOSIS — Z7189 Other specified counseling: Secondary | ICD-10-CM

## 2020-12-28 LAB — GLUCOSE, CAPILLARY
Glucose-Capillary: 192 mg/dL — ABNORMAL HIGH (ref 70–99)
Glucose-Capillary: 176 mg/dL — ABNORMAL HIGH (ref 70–99)
Glucose-Capillary: 135 mg/dL — ABNORMAL HIGH (ref 70–99)
Glucose-Capillary: 261 mg/dL — ABNORMAL HIGH (ref 70–99)

## 2020-12-28 LAB — ECHOCARDIOGRAM COMPLETE
AR max vel: 1 cm2
AV Area VTI: 1.02 cm2
AV Area mean vel: 1.11 cm2
AV Mean grad: 6 mmHg
AV Peak grad: 11.8 mmHg
Ao pk vel: 1.72 m/s
Area-P 1/2: 4.6 cm2
Height: 64 in
P 1/2 time: 231 msec
S' Lateral: 3.3 cm
Weight: 2144 oz

## 2020-12-28 LAB — BASIC METABOLIC PANEL
Anion gap: 10 (ref 5–15)
BUN: 40 mg/dL — ABNORMAL HIGH (ref 8–23)
CO2: 29 mmol/L (ref 22–32)
Calcium: 8.6 mg/dL — ABNORMAL LOW (ref 8.9–10.3)
Chloride: 97 mmol/L — ABNORMAL LOW (ref 98–111)
Creatinine, Ser: 1 mg/dL (ref 0.44–1.00)
GFR, Estimated: 54 mL/min — ABNORMAL LOW (ref >60)
Glucose, Bld: 186 mg/dL — ABNORMAL HIGH (ref 70–99)
Potassium: 3.8 mmol/L (ref 3.5–5.1)
Sodium: 136 mmol/L (ref 135–145)

## 2020-12-28 LAB — CBC
HCT: 32.9 % — ABNORMAL LOW (ref 36.0–46.0)
Hemoglobin: 10.8 g/dL — ABNORMAL LOW (ref 12.0–15.0)
MCH: 29.3 pg (ref 26.0–34.0)
MCHC: 32.8 g/dL (ref 30.0–36.0)
MCV: 89.4 fL (ref 80.0–100.0)
Platelets: 350 10*3/uL (ref 150–400)
RBC: 3.68 MIL/uL — ABNORMAL LOW (ref 3.87–5.11)
RDW: 13.3 % (ref 11.5–15.5)
WBC: 16.4 10*3/uL — ABNORMAL HIGH (ref 4.0–10.5)
nRBC: 0 % (ref 0.0–0.2)

## 2020-12-28 LAB — PROCALCITONIN: Procalcitonin: <0.1 ng/mL

## 2020-12-28 LAB — TROPONIN I (HIGH SENSITIVITY)
Troponin I (High Sensitivity): 622 ng/L (ref <18)
Troponin I (High Sensitivity): 574 ng/L (ref <18)

## 2020-12-28 LAB — HEPARIN LEVEL (UNFRACTIONATED)
Heparin Unfractionated: 0.3 IU/mL (ref 0.30–0.70)
Heparin Unfractionated: 0.33 IU/mL (ref 0.30–0.70)

## 2020-12-28 MED ORDER — EZETIMIBE 10 MG PO TABS
10.0000 mg | ORAL_TABLET | Freq: Every day | ORAL | Status: DC
  Administered 2020-12-28 – 2020-12-30 (×3): 10 mg via ORAL
  Filled 2020-12-28 (×3): qty 1

## 2020-12-28 MED ORDER — MOMETASONE FURO-FORMOTEROL FUM 100-5 MCG/ACT IN AERO
2.0000 | INHALATION_SPRAY | Freq: Two times a day (BID) | RESPIRATORY_TRACT | Status: DC
  Administered 2020-12-28 – 2020-12-30 (×5): 2 via RESPIRATORY_TRACT
  Filled 2020-12-28: qty 8.8

## 2020-12-28 MED ORDER — FUROSEMIDE 20 MG PO TABS
20.0000 mg | ORAL_TABLET | Freq: Every day | ORAL | Status: DC
  Administered 2020-12-29 – 2020-12-30 (×2): 20 mg via ORAL
  Filled 2020-12-28 (×2): qty 1

## 2020-12-28 MED ORDER — ACETAMINOPHEN-CODEINE #3 300-30 MG PO TABS
1.0000 | ORAL_TABLET | Freq: Three times a day (TID) | ORAL | Status: DC | PRN
  Administered 2020-12-28: 1 via ORAL
  Filled 2020-12-28: qty 1

## 2020-12-28 MED ORDER — BENZONATATE 100 MG PO CAPS
100.0000 mg | ORAL_CAPSULE | Freq: Three times a day (TID) | ORAL | Status: DC
  Administered 2020-12-28 – 2020-12-30 (×7): 100 mg via ORAL
  Filled 2020-12-28 (×7): qty 1

## 2020-12-28 MED ORDER — VITAMIN D 25 MCG (1000 UNIT) PO TABS
2000.0000 [IU] | ORAL_TABLET | Freq: Every day | ORAL | Status: DC
  Administered 2020-12-28 – 2020-12-30 (×3): 2000 [IU] via ORAL
  Filled 2020-12-28 (×3): qty 2

## 2020-12-28 MED ORDER — NEPRO/CARBSTEADY PO LIQD
237.0000 mL | Freq: Three times a day (TID) | ORAL | Status: DC
  Administered 2020-12-29 – 2020-12-30 (×5): 237 mL via ORAL

## 2020-12-28 MED ORDER — GUAIFENESIN 100 MG/5ML PO LIQD
10.0000 mL | ORAL | Status: DC | PRN
  Administered 2020-12-30: 10 mL via ORAL
  Filled 2020-12-28 (×4): qty 10

## 2020-12-28 MED ORDER — ASCORBIC ACID 500 MG PO TABS
500.0000 mg | ORAL_TABLET | Freq: Every day | ORAL | Status: DC
  Administered 2020-12-28 – 2020-12-30 (×3): 500 mg via ORAL
  Filled 2020-12-28 (×3): qty 1

## 2020-12-28 MED ORDER — ADULT MULTIVITAMIN W/MINERALS CH
1.0000 | ORAL_TABLET | Freq: Every day | ORAL | Status: DC
  Administered 2020-12-28 – 2020-12-30 (×3): 1 via ORAL
  Filled 2020-12-28 (×3): qty 1

## 2020-12-28 MED ORDER — ISOSORBIDE MONONITRATE ER 30 MG PO TB24
15.0000 mg | ORAL_TABLET | Freq: Every day | ORAL | Status: DC
  Administered 2020-12-28: 11:00:00 15 mg via ORAL
  Filled 2020-12-28: qty 1

## 2020-12-28 MED ORDER — GUAIFENESIN 100 MG/5ML PO LIQD: 5.0000 mL | ORAL | Status: DC | PRN

## 2020-12-28 MED ORDER — DOCUSATE SODIUM 100 MG PO CAPS
100.0000 mg | ORAL_CAPSULE | Freq: Two times a day (BID) | ORAL | Status: DC
  Administered 2020-12-28 – 2020-12-30 (×5): 100 mg via ORAL
  Filled 2020-12-28 (×5): qty 1

## 2020-12-28 MED ORDER — HEPARIN BOLUS VIA INFUSION
1800.0000 [IU] | Freq: Once | INTRAVENOUS | Status: AC
  Administered 2020-12-28: 02:00:00 1800 [IU] via INTRAVENOUS
  Filled 2020-12-28: qty 1800

## 2020-12-28 MED ORDER — FERROUS SULFATE 325 (65 FE) MG PO TABS
325.0000 mg | ORAL_TABLET | Freq: Every day | ORAL | Status: DC
  Administered 2020-12-28 – 2020-12-30 (×3): 325 mg via ORAL
  Filled 2020-12-28 (×3): qty 1

## 2020-12-28 NOTE — Progress Notes (Signed)
Initial Nutrition Assessment  DOCUMENTATION CODES:   Non-severe (moderate) malnutrition in context of chronic illness  INTERVENTION:   -Nepro Shake po TID, each supplement provides 425 kcal and 19 grams protein  -MVI with minerals daily -Magic cup TID with meals, each supplement provides 290 kcal and 9 grams of protein   NUTRITION DIAGNOSIS:   Moderate Malnutrition related to chronic illness (CHF) as evidenced by mild fat depletion, moderate fat depletion, mild muscle depletion, moderate muscle depletion.  GOAL:   Patient will meet greater than or equal to 90% of their needs  MONITOR:   PO intake, Supplement acceptance, Diet advancement, Labs, Weight trends, Skin, I & O's  REASON FOR ASSESSMENT:   Low Braden    ASSESSMENT:   Amy Calhoun is a 87 y.o. 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  Patient reports for the past couple weeks she had a cough but without fever or chills and then developed chest pain in the past few days.  The pain is of moderate intensity nonradiating and is constant.  No aggravating or alleviating factors except for exertion.  Denies nausea, vomiting, diaphoresis and palpitations  Pt admitted with NSTEMI and CHF.   12/13- s/p BSE- advanced to dysphagia 3 diet with thin liquids  Reviewed I/O's: -1.1 L x 24 hours  UOP: 1.4 L x 24 hours  Spoke with pt at bedside. Pt is very HOH and heard better on her left side. She shares that her appetite is poor currently and PTA. Noted pt did not attempt lunch tray- per pt, it was too cold for her to eat.    Pt endorses wt loss, stated she has lost "a lot" of weight, however, unable to quantify amount or time frame of weight loss. Reviewed wt hx; pt has experienced a 4.6% wt loss over the past 3 months. While this is  not significant for time frame, it is concerning given pt's advanced age and multiple co-morbidities.   Palliative care following; plan family meeting tomorrow. Pt with esophageal stricture and has had trouble swallowing as she is overdue to dilation.   Discussed importance of good meal and supplement intake. Pt amenable to supplements.   Medications reviewed and include vitamin C, vitamin D 3, and lasix  Lab Results  Component Value Date   HGBA1C 9.1 (A) 12/12/2020   PTA DM medications are 14 units insulin glargine daily and 8 units insulin lispro TID.   Labs reviewed: CBGS: 109-192 (inpatient orders for glycemic control are 0-15 units insulin aspart TID with meals, 0-5 units insulin aspart daily at bedtime, and 7 units insulin glargine-yfgn).    NUTRITION - FOCUSED PHYSICAL EXAM:  Flowsheet Row Most Recent Value  Orbital Region Severe depletion  Upper Arm Region Moderate depletion  Thoracic and Lumbar Region Mild depletion  Buccal Region Moderate depletion  Temple Region Moderate depletion  Clavicle Bone Region Severe depletion  Clavicle and Acromion Bone Region Severe depletion  Dorsal Hand Moderate depletion  Patellar Region No depletion  Anterior Thigh Region No depletion  Posterior Calf Region No depletion  Edema (RD Assessment) Mild  Hair Reviewed  Eyes Reviewed  Mouth Reviewed  Skin Reviewed  Nails Reviewed       Diet Order:   Diet Order  DIET DYS 3 Room service appropriate? Yes with Assist; Fluid consistency: Nectar Thick  Diet effective now                   EDUCATION NEEDS:   Education needs have been addressed  Skin:  Skin Assessment: Skin Integrity Issues: Skin Integrity Issues:: Stage II Stage II: coccyx  Last BM:  Unknown  Height:   Ht Readings from Last 1 Encounters:  12/27/20 5\' 4"  (1.626 m)    Weight:   Wt Readings from Last 1 Encounters:  12/28/20 56.3 kg    Ideal Body Weight:  54.5 kg  BMI:  Body mass index is  21.3 kg/m.  Estimated Nutritional Needs:   Kcal:  1700-1900  Protein:  85-100 grams  Fluid:  > 1.7 L    12/30/20, RD, LDN, CDCES Registered Dietitian II Certified Diabetes Care and Education Specialist Please refer to [facility] for RD and/or RD on-call/weekend/after hours pager

## 2020-12-28 NOTE — Progress Notes (Signed)
° °  Heart Failure Nurse Navigator Note ° °Met briefly with the patient today, her daughter was not at the bedside.  The heart failure educational packet with the patient told her that I would come back when her daughter was present so the 3 of us could talk.  She was in agreement. ° °  RN CHFN °

## 2020-12-28 NOTE — Consult Note (Signed)
Consultation Note Date: 12/28/2020   Patient Name: Amy Calhoun  DOB: August 27, 1928  MRN: [id number]  Age / Sex: 87 y.o., female  PCP: Burnard Hawthorne, FNP Referring Physician: Fritzi Mandes, MD  Reason for Consultation: Establishing goals of care  HPI/Patient Profile: Amy Calhoun is a 87 y.o. 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  Patient reports for the past couple weeks she had a cough but without fever or chills and then developed chest pain in the past few days.  The pain is of moderate intensity nonradiating and is constant.  Clinical Assessment and Goals of Care: Patient is sitting in bed with granddaughter at bedside. Patient currently denies complaint. She is widowed with 6 children.   She states she had been independent. The grand-daughter states family has been kept updated, and some of the children are flying in tonight. She states the overall plan is to get her home.   We discussed the past few months. Discussed that her pacemaker was placed 10/11/20. A couple of weeks later she developed cold like symptoms and took multiple antibiotics that did not help. We discussed her diet. She typically gets her throat dilated every 6 months and is overdue. She states once dilated she can eat and drink anything she wants. Discussed the need for anesthesia to be able to have the procedure. She is able to articulate her cardiac status.   Question of GOC once going home.     SUMMARY OF RECOMMENDATIONS   Family meeting tomorrow at 1:00pm with patient and 3 children.   Prognosis:  Poor overall       Primary Diagnoses: Present on Admission:  NSTEMI (non-ST elevated myocardial infarction) ([place])  AKI  (acute kidney injury) ([place])  Anemia   I have reviewed the medical record, interviewed the patient and family, and examined the patient. The following aspects are pertinent.  Past Medical History:  Diagnosis Date   Allergy    Ambulates with cane    Anemia    At high risk for falls    Cataract    removed both eyes   Chronic kidney disease 08/07/2016   Chronic renal failure, stage III   Depression    Esophageal dysphagia    Gastric ulcer    GERD (gastroesophageal reflux disease)    Hyperlipidemia    Hypertension    Hypothyroidism    IDDM (insulin dependent diabetes mellitus)    Ischemic colitis ([place])    Neuromuscular disorder (HCC)    neuropathy   Osteoarthritis    Ulcer of esophagus    Social History   Socioeconomic History   Marital status: Widowed    Spouse name: Not on file   Number of children: Not on file   Years of education: Not on file   Highest education level: Not on file  Occupational  History   Occupation: retired  Tobacco Use   Smoking status: Never   Smokeless tobacco: Never  Vaping Use   Vaping Use: Never used  Substance and Sexual Activity   Alcohol use: No   Drug use: No   Sexual activity: Not Currently    Partners: Male  Other Topics Concern   Not on file  [place] [facility], independent living.    Provide her with cleaning, one meal per day.    Social Determinants of Health   Financial Resource Strain: Low Risk    Difficulty of Paying Living Expenses: Not hard at all  Food Insecurity: No Food Insecurity   Worried About Charity fundraiser in the Last Year: Never true   [place] in the Last Year: Never true  Transportation Needs: No Transportation Needs   Lack of Transportation (Medical): No   Lack of Transportation (Non-Medical): No  Physical Activity: Sufficiently Active   Days of Exercise per Week: 3 days   Minutes of Exercise per Session: 50 min  Stress: No Stress Concern Present   Feeling  of Stress : Not at all  Social Connections: Unknown   Frequency of Communication with Friends and Family: Not on file   Frequency of Social Gatherings with Friends and Family: More than three times a week   Attends Religious Services: Not on Electrical engineer or Organizations: Not on file   Attends Archivist Meetings: Not on file   Marital Status: Not on file   Family History  Problem Relation Age of Onset   Diabetes Mother    Arthritis Mother    Hyperlipidemia Mother    Mental illness Mother    Heart disease Father    Mental illness Sister    Arthritis Maternal Grandmother    Arthritis Maternal Grandfather    Colon cancer Neg Hx    Stomach cancer Neg Hx    Esophageal cancer Neg Hx    Rectal cancer Neg Hx    Colon polyps Neg Hx    Scheduled Meds:  ascorbic acid  500 mg Oral Daily   aspirin EC  81 mg Oral Daily   benzonatate  100 mg Oral TID   cholecalciferol  2,000 Units Oral Daily   docusate sodium  100 mg Oral BID   ezetimibe  10 mg Oral Daily   ferrous sulfate  325 mg Oral Q breakfast   [START ON 12/29/2020] furosemide  20 mg Oral Daily   insulin aspart  0-15 Units Subcutaneous TID WC   insulin aspart  0-5 Units Subcutaneous QHS   insulin glargine-yfgn  7 Units Subcutaneous Daily   isosorbide mononitrate  15 mg Oral Daily   metoprolol tartrate  12.5 mg Oral BID   mometasone-formoterol  2 puff Inhalation BID   Continuous Infusions:  azithromycin 500 mg (12/28/20 1340)   cefTRIAXone (ROCEPHIN)  IV 2 g (12/28/20 1251)   heparin 1,100 Units/hr (12/28/20 1019)   PRN Meds:.acetaminophen, acetaminophen-codeine, guaiFENesin, nitroGLYCERIN, ondansetron (ZOFRAN) IV Medications Prior to Admission:  Prior to Admission medications   Medication Sig Start Date End Date Taking? Authorizing Provider  Acetaminophen-Codeine 300-30 MG tablet Take 1 tablet by mouth every 12 (twelve) hours as needed for pain (moderate to severe pain). 11/30/20  Yes Burnard Hawthorne, FNP  albuterol (PROVENTIL) (2.5 MG/3ML) 0.083% nebulizer solution Take 3 mLs (2.5 mg total) by nebulization every 6 (six) hours as needed for wheezing  or shortness of breath. 12/21/20  Yes Arnett, Lyn Records, FNP  amitriptyline (ELAVIL) 100 MG tablet Take 1 tablet (100 mg total) by mouth at bedtime. 12/07/20  Yes Allegra Grana, FNP  ascorbic acid (VITAMIN C) 500 MG tablet Take 500 mg by mouth daily.   Yes [provider]  budesonide-formoterol (SYMBICORT) 80-4.5 MCG/ACT inhaler Inhale 2 puffs into the lungs 2 (two) times daily. 12/23/20  Yes [provider]  Cholecalciferol (VITAMIN D) 2000 units tablet Take 2,000 Units by mouth daily.    Yes [provider]  Cyanocobalamin (DODEX IJ) Inject as directed every 30 (thirty) days.   Yes [provider]  doxycycline (VIBRAMYCIN) 100 MG capsule Take 1 capsule (100 mg total) by mouth 2 (two) times daily for 7 days. 12/26/20 01/02/21 Yes Couture, Cortni S, PA-C  ezetimibe (ZETIA) 10 MG tablet Take 1 tablet (10 mg total) by mouth daily. 06/29/20  Yes Allegra Grana, FNP  ferrous sulfate 324 MG TBEC Take 324 mg by mouth daily with breakfast.   Yes [provider]  furosemide (LASIX) 20 MG tablet Take 10 mg by mouth daily.   Yes [provider]  gabapentin (NEURONTIN) 100 MG capsule Take 2 capsules (200 mg total) by mouth 3 (three) times daily. Patient taking differently: Take 100-200 mg by mouth 3 (three) times daily. 10/17/20  Yes Lurene Shadow, MD  Insulin Glargine (LANTUS SOLOSTAR ) Inject 14 Units into the skin at bedtime.   Yes [provider]  insulin lispro (HUMALOG KWIKPEN) 100 UNIT/ML KwikPen Inject 8 Units into the skin 3 (three) times daily. Max Daily 30 units 09/21/20  Yes Shamleffer, Konrad Dolores, MD  levothyroxine (SYNTHROID) 150 MCG tablet TAKE 1 TABLET DAILY Patient taking differently: Take 150 mcg by mouth daily before breakfast. 01/28/20  Yes Arnett, Lyn Records, FNP  metoprolol tartrate (LOPRESSOR) 25 MG tablet Take 25 mg by mouth. 1/2 tablet in AM and 1 tablet in the evening   Yes [provider]  Multiple Vitamins-Minerals (PRESERVISION AREDS PO) Take 1 tablet by mouth daily.   Yes [provider]  omeprazole (PRILOSEC) 40 MG capsule TAKE 1 CAPSULE TWICE A DAY Patient taking differently: Take 40 mg by mouth 2 (two) times daily. 07/25/20  Yes Rachael Fee, MD  predniSONE (DELTASONE) 50 MG tablet Take 1 tablet (50 mg total) by mouth daily for 5 days. 12/26/20 12/31/20 Yes Couture, Cortni S, PA-C  albuterol (VENTOLIN HFA) 108 (90 Base) MCG/ACT inhaler Inhale 2 puffs into the lungs every 6 (six) hours as needed for wheezing or shortness of breath. 12/21/20   Allegra Grana, FNP  Continuous Blood Gluc Sensor (FREESTYLE LIBRE 14 DAY SENSOR) MISC 1 Package by Does not apply route every 14 (fourteen) days. 09/03/19   Shamleffer, Konrad Dolores, MD  docusate sodium (COLACE) 100 MG capsule Take 1 capsule (100 mg total) by mouth 2 (two) times daily. 03/31/16   Katharina Caper, MD  fluticasone-salmeterol (ADVAIR) 100-50 MCG/ACT AEPB Inhale 1 puff into the lungs 2 (two) times daily. Patient not taking: Reported on 12/27/2020 12/22/20   Allegra Grana, FNP  glucagon 1 MG injection Inject 1 mg into the muscle once as needed for up to 1 dose. Hypoglycemia If she is not tolerating PO. 12/07/20   Arnett, Lyn Records, FNP  Insulin Pen Needle 32G X 4 MM MISC 1 Device by Does not apply route in the morning, at noon, in the evening, and at bedtime. 11/13/19   Shamleffer, Konrad Dolores,  MD  lisinopril (ZESTRIL) 2.5 MG tablet Take 2.5 mg by mouth every other day. Patient not taking: Reported on 12/22/2020    [provider]  [place]. Devices (BARIATRIC ROLLATOR) MISC Use as needed 07/31/19   Burnard Hawthorne, FNP  pyridOXINE (VITAMIN B-6) 100 MG tablet Take 100 mg by mouth daily. Patient not taking: Reported on 12/27/2020    [provider]   Allergies  Allergen Reactions   Pravachol [Pravastatin] Other (See Comments)    States she refused due to side effects   Review of Systems  All other systems reviewed and are negative.  Physical Exam Pulmonary:     Effort: Pulmonary effort is normal.  Neurological:     Mental Status: She is alert.    Vital Signs: BP (!) 150/58 (BP Location: Right Arm)    Pulse 87    Temp 98.3 F (36.8 C) (Oral)    Resp 20    Ht 5\' 4"  (1.626 m)    Wt 56.3 kg    SpO2 100%    BMI 21.30 kg/m  Pain Scale: 0-10   Pain Score: 0-No pain   SpO2: SpO2: 100 % O2 Device:SpO2: 100 % O2 Flow Rate: .O2 Flow Rate (L/min): 2 L/min  IO: Intake/output summary:  Intake/Output Summary (Last 24 hours) at 12/28/2020 1557 Last data filed at 12/28/2020 0700 Gross per 24 hour  Intake 240 ml  Output 1350 ml  Net -1110 ml    LBM:   Baseline Weight: Weight: 60.8 kg Most recent weight: Weight: 56.3 kg       Time In: 3:30 Time Out: 4:00 Time Total: 30 min Greater than 50%  of this time was spent counseling and coordinating care related to the above assessment and plan.  Signed by: Asencion Gowda, NP   Please contact Palliative Medicine Team phone at [phone] for questions and concerns.  For individual provider: See Shea Evans

## 2020-12-28 NOTE — Progress Notes (Signed)
Progress Note  Patient Name: Amy Calhoun Date of Encounter: 12/28/2020  [facility] Cardiologist: Nelva Bush, MD   Subjective   Denies chest pain or shortness of breath.  Has occasional cough.  Inpatient Medications    Scheduled Meds:  ascorbic acid  500 mg Oral Daily   aspirin EC  81 mg Oral Daily   benzonatate  100 mg Oral TID   cholecalciferol  2,000 Units Oral Daily   docusate sodium  100 mg Oral BID   ezetimibe  10 mg Oral Daily   ferrous sulfate  325 mg Oral Q breakfast   furosemide  40 mg Intravenous Q12H   insulin aspart  0-15 Units Subcutaneous TID WC   insulin aspart  0-5 Units Subcutaneous QHS   insulin glargine-yfgn  7 Units Subcutaneous Daily   isosorbide mononitrate  15 mg Oral Daily   metoprolol tartrate  12.5 mg Oral BID   mometasone-formoterol  2 puff Inhalation BID   Continuous Infusions:  azithromycin 500 mg (12/28/20 1340)   cefTRIAXone (ROCEPHIN)  IV 2 g (12/28/20 1251)   heparin 1,100 Units/hr (12/28/20 1019)   PRN Meds: acetaminophen, acetaminophen-codeine, guaiFENesin, nitroGLYCERIN, ondansetron (ZOFRAN) IV   Vital Signs    Vitals:   12/27/20 2341 12/28/20 0505 12/28/20 0508 12/28/20 1100  BP: (!) 151/51  (!) 177/66 (!) 150/58  Pulse: 89  85 87  Resp: 18  20 20   Temp: 98.9 F (37.2 C)  98.8 F (37.1 C) 98.3 F (36.8 C)  TempSrc:   Oral Oral  SpO2: 100%  96% 100%  Weight:  56.3 kg    Height:        Intake/Output Summary (Last 24 hours) at 12/28/2020 1456 Last data filed at 12/28/2020 0700 Gross per 24 hour  Intake 240 ml  Output 1350 ml  Net -1110 ml   Last 3 Weights 12/28/2020 12/27/2020 12/19/2020  Weight (lbs) 124 lb 1.9 oz 134 lb 134 lb  Weight (kg) 56.3 kg 60.782 kg 60.782 kg      Telemetry    A sensed V paced rhythm- Personally Reviewed  ECG     - Personally Reviewed  Physical Exam   GEN: No acute distress.   Neck: No JVD Cardiac: Distant heart sounds, regular, Respiratory: Clear to  auscultation bilaterally. GI: Soft, nontender, non-distended  MS: No edema; No deformity. Neuro:  Nonfocal  Psych: Normal affect   Labs    High Sensitivity Troponin:   Recent Labs  Lab 12/27/20 0014 12/27/20 0242 12/28/20 0844 12/28/20 1128  TROPONINIHS 124* 803* 622* 574*     Chemistry Recent Labs  Lab 12/27/20 0014 12/27/20 0703 12/28/20 0844  NA 131* 132* 136  K 4.9 4.7 3.8  CL 100 98 97*  CO2 22 25 29   GLUCOSE 424* 417* 186*  BUN 45* 46* 40*  CREATININE 1.31* 1.22* 1.00  CALCIUM 8.5* 8.7* 8.6*  GFRNONAA 39* 42* 54*  ANIONGAP 9 9 10     Lipids No results for input(s): CHOL, TRIG, HDL, LABVLDL, LDLCALC, CHOLHDL in the last 168 hours.  Hematology Recent Labs  Lab 12/27/20 0014 12/27/20 0703 12/28/20 0844  WBC 11.2* 11.2* 16.4*  RBC 3.22* 3.44* 3.68*  HGB 9.6* 10.1* 10.8*  HCT 29.7* 31.4* 32.9*  MCV 92.2 91.3 89.4  MCH 29.8 29.4 29.3  MCHC 32.3 32.2 32.8  RDW 13.3 13.4 13.3  PLT 308 339 350   Thyroid No results for input(s): TSH, FREET4 in the last 168 hours.  BNP Recent Labs  Lab 12/27/20 0014  BNP 1,034.4*    DDimer No results for input(s): DDIMER in the last 168 hours.   Radiology    DG Chest 2 View  Result Date: 12/27/2020 CLINICAL DATA:  Chest pain. EXAM: CHEST - 2 VIEW COMPARISON:  Chest radiograph dated 12/19/2020. FINDINGS: Diffuse interstitial coarsening, progressed since the prior radiograph and may represent mild edema. Atypical pneumonia is not excluded. Clinical correlation is recommended. There is eventration of the right hemidiaphragm. No pleural effusion pneumothorax. The cardiac silhouette is within limits. Atherosclerotic calcification of the aorta. Left pectoral pacemaker device. Degenerative changes of the spine. No acute osseous pathology. IMPRESSION: Diffuse interstitial coarsening may represent mild edema. Atypical pneumonia is not excluded. Electronically Signed   By: Elgie Collard M.D.   On: 12/27/2020 00:39   DG Chest Port  1 View  Result Date: 12/27/2020 CLINICAL DATA:  Shortness of breath, chest pain EXAM: PORTABLE CHEST 1 VIEW COMPARISON:  Radiograph 12/27/2020, chest CT 11/05/2020 FINDINGS: Unchanged cardiomediastinal silhouette with dual chamber pacemaker. Markedly increased interstitial and airspace disease left greater than right. Probable small left pleural effusion. No visible pneumothorax. IMPRESSION: Markedly increased interstitial and airspace disease, left greater than right, favored to be severe pulmonary edema. Electronically Signed   By: Caprice Renshaw M.D.   On: 12/27/2020 11:17    Cardiac Studies   Echo 12/27/2020 1. Left ventricular ejection fraction, by estimation, is 55 to 60%. The  left ventricle has normal function. The left ventricle has no regional  wall motion abnormalities. There is mild left ventricular hypertrophy.  Left ventricular diastolic parameters  are consistent with Grade II diastolic dysfunction (pseudonormalization).   2. Right ventricular systolic function is normal. The right ventricular  size is normal.   3. The mitral valve is degenerative. Moderate mitral valve regurgitation.  Moderate mitral annular calcification.   4. The aortic valve is tricuspid. Aortic valve regurgitation is mild to  moderate. Aortic valve sclerosis/calcification is present, without any  evidence of aortic stenosis.    Patient Profile     87 y.o. female with history of complete heart block s/p permanent pacemaker, CAD, presenting with chest pain, diagnosed with NSTEMI.  Assessment & Plan    Chest pain NSTEMI -Prior left heart catheter diffuse RCA disease. -No plan for invasive testing or intervention. -Continue heparin drip x48 hours to complete today -Start Imdur 15 mg for antianginal benefit, continue Lopressor  2.  CHB s/p PPM -Placement appears to be functioning normally  3.  HFpEF,  -appears euvolemic. -Resume PTA Lasix 20 mg daily.  Stop IV Lasix.  Total encounter time 35  minutes  Greater than 50% was spent in counseling and coordination of care with the patient    Signed, Debbe Odea, MD  12/28/2020, 2:56 PM

## 2020-12-28 NOTE — Progress Notes (Signed)
[place] [facility] [place] NAME: Amy Calhoun    MR#:  [id number]  DATE OF BIRTH:  [birth year]  SUBJECTIVE:  continues to have coughing spell and read pain from cough. Daughter Amy Calhoun at bedside. Patient asking for cough medicine. No fever.  REVIEW OF SYSTEMS:   Review of Systems  Constitutional:  Negative for chills, fever and weight loss.  HENT:  Negative for ear discharge, ear pain and nosebleeds.   Eyes:  Negative for blurred vision, pain and discharge.  Respiratory:  Positive for cough and shortness of breath. Negative for sputum production, wheezing and stridor.   Cardiovascular:  Positive for chest pain. Negative for palpitations, orthopnea and PND.  Gastrointestinal:  Negative for abdominal pain, diarrhea, nausea and vomiting.  Genitourinary:  Negative for frequency and urgency.  Musculoskeletal:  Negative for back pain and joint pain.  Neurological:  Positive for weakness. Negative for sensory change, speech change and focal weakness.  Psychiatric/Behavioral:  Negative for depression and hallucinations. The patient is not nervous/anxious.   Tolerating Diet:yes Tolerating PT:   DRUG ALLERGIES:   Allergies  Allergen Reactions   Pravachol [Pravastatin] Other (See Comments)    States she refused due to side effects    VITALS:  Blood pressure (!) 141/54, pulse 83, temperature 98.3 F (36.8 C), resp. rate 14, height 5\' 4"  (1.626 m), weight 56.3 kg, SpO2 94 %.  PHYSICAL EXAMINATION:   Physical Exam  GENERAL:  87 y.o.-year-old patient lying in the bed with no acute distress.  HEENT: Head atraumatic, normocephalic. Oropharynx and nasopharynx clear.  LUNGS:decreased  breath sounds bilaterally, no wheezing, rales, rhonchi. No use of accessory muscles of respiration.  CARDIOVASCULAR: S1, S2 normal. No murmurs, rubs, or gallops.  ABDOMEN: Soft, nontender, nondistended. Bowel sounds present. No organomegaly or mass.  EXTREMITIES: No cyanosis,  clubbing  trace edema b/l.    NEUROLOGIC: nonfocal PSYCHIATRIC:  patient is alert  SKIN:  Pressure Injury 12/28/20 Coccyx Mid Stage 2 -  Partial thickness loss of dermis presenting as a shallow open injury with a red, pink wound bed without slough. stage 3, 3cm x2cm pressure ulcer to coccyx. Yellor slough with minimal dard center. No bone exp (Active)  12/28/20 0000  Location: Coccyx  Location Orientation: Mid  Staging: Stage 2 -  Partial thickness loss of dermis presenting as a shallow open injury with a red, pink wound bed without slough.  Wound Description (Comments): stage 3, 3cm x2cm pressure ulcer to coccyx. Yellor slough with minimal dard center. No bone exposed. Cleaned and foam applied  Present on Admission:         LABORATORY PANEL:  CBC Recent Labs  Lab 12/28/20 0844  WBC 16.4*  HGB 10.8*  HCT 32.9*  PLT 350    Chemistries  Recent Labs  Lab 12/28/20 0844  NA 136  K 3.8  CL 97*  CO2 29  GLUCOSE 186*  BUN 40*  CREATININE 1.00  CALCIUM 8.6*   Cardiac Enzymes No results for input(s): TROPONINI in the last 168 hours. RADIOLOGY:  DG Chest 2 View  Result Date: 12/27/2020 CLINICAL DATA:  Chest pain. EXAM: CHEST - 2 VIEW COMPARISON:  Chest radiograph dated 12/19/2020. FINDINGS: Diffuse interstitial coarsening, progressed since the prior radiograph and may represent mild edema. Atypical pneumonia is not excluded. Clinical correlation is recommended. There is eventration of the right hemidiaphragm. No pleural effusion pneumothorax. The cardiac silhouette is within limits. Atherosclerotic calcification of the aorta. Left pectoral pacemaker device. Degenerative  changes of the spine. No acute osseous pathology. IMPRESSION: Diffuse interstitial coarsening may represent mild edema. Atypical pneumonia is not excluded. Electronically Signed   By: Elgie Collard M.D.   On: 12/27/2020 00:39   DG Chest Port 1 View  Result Date: 12/27/2020 CLINICAL DATA:  Shortness of  breath, chest pain EXAM: PORTABLE CHEST 1 VIEW COMPARISON:  Radiograph 12/27/2020, chest CT 11/05/2020 FINDINGS: Unchanged cardiomediastinal silhouette with dual chamber pacemaker. Markedly increased interstitial and airspace disease left greater than right. Probable small left pleural effusion. No visible pneumothorax. IMPRESSION: Markedly increased interstitial and airspace disease, left greater than right, favored to be severe pulmonary edema. Electronically Signed   By: Caprice Renshaw M.D.   On: 12/27/2020 11:17   ECHOCARDIOGRAM COMPLETE  Result Date: 12/28/2020    ECHOCARDIOGRAM REPORT   Patient Name:   Amy Calhoun Date of Exam: 12/27/2020 Medical Rec #:  [id number]         Height:       64.0 in Accession #:    [id number]        Weight:       134.0 lb Date of Birth:  [birth year]          BSA:          1.650 m Patient Age:    87 years          BP:           158/60 mmHg Patient Gender: F                 HR:           89 bpm. Exam Location:  [facility] Procedure: 2D Echo, Cardiac Doppler and Color Doppler Indications:     I50.31 Acute Diastolic Heart Failure  History:         Patient has prior history of Echocardiogram examinations, most                  recent 10/11/2020. CAD; Risk Factors:Hypertension, Dyslipidemia                  and Diabetes. Hypothyroidism.  Sonographer:     Daphine Deutscher RDCS Referring Phys:  [id number] Andris Baumann Diagnosing Phys: Debbe Odea MD IMPRESSIONS  1. Left ventricular ejection fraction, by estimation, is 55 to 60%. The left ventricle has normal function. The left ventricle has no regional wall motion abnormalities. There is mild left ventricular hypertrophy. Left ventricular diastolic parameters are consistent with Grade II diastolic dysfunction (pseudonormalization).  2. Right ventricular systolic function is normal. The right ventricular size is normal.  3. The mitral valve is degenerative. Moderate mitral valve regurgitation. Moderate mitral annular calcification.   4. The aortic valve is tricuspid. Aortic valve regurgitation is mild to moderate. Aortic valve sclerosis/calcification is present, without any evidence of aortic stenosis. FINDINGS  Left Ventricle: Left ventricular ejection fraction, by estimation, is 55 to 60%. The left ventricle has normal function. The left ventricle has no regional wall motion abnormalities. The left ventricular internal cavity size was normal in size. There is  mild left ventricular hypertrophy. Left ventricular diastolic parameters are consistent with Grade II diastolic dysfunction (pseudonormalization). Right Ventricle: The right ventricular size is normal. No increase in right ventricular wall thickness. Right ventricular systolic function is normal. Left Atrium: Left atrial size was normal in size. Right Atrium: Right atrial size was normal in size. Pericardium: There is no evidence of pericardial effusion. Mitral Valve: The mitral valve is degenerative in appearance.  Moderate mitral annular calcification. Moderate mitral valve regurgitation. Tricuspid Valve: The tricuspid valve is normal in structure. Tricuspid valve regurgitation is mild. Aortic Valve: The aortic valve is tricuspid. Aortic valve regurgitation is mild to moderate. Aortic regurgitation PHT measures 231 msec. Aortic valve sclerosis/calcification is present, without any evidence of aortic stenosis. Aortic valve mean gradient measures 6.0 mmHg. Aortic valve peak gradient measures 11.8 mmHg. Aortic valve area, by VTI measures 1.02 cm. Pulmonic Valve: The pulmonic valve was not well visualized. Pulmonic valve regurgitation is not visualized. Aorta: The aortic root and ascending aorta are structurally normal, with no evidence of dilitation. Venous: The inferior vena cava was not well visualized. IAS/Shunts: No atrial level shunt detected by color flow Doppler.  LEFT VENTRICLE PLAX 2D LVIDd:         4.70 cm   Diastology LVIDs:         3.30 cm   LV e' medial:    5.55 cm/s LV PW:          1.10 cm   LV E/e' medial:  26.3 LV IVS:        1.10 cm   LV e' lateral:   5.77 cm/s LVOT diam:     1.70 cm   LV E/e' lateral: 25.3 LV SV:         30 LV SV Index:   18 LVOT Area:     2.27 cm  RIGHT VENTRICLE RV Basal diam:  3.40 cm RV S prime:     11.70 cm/s TAPSE (M-mode): 1.2 cm LEFT ATRIUM             Index        RIGHT ATRIUM          Index LA diam:        3.60 cm 2.18 cm/m   RA Area:     6.24 cm LA Vol (A2C):   27.7 ml 16.79 ml/m  RA Volume:   10.60 ml 6.42 ml/m LA Vol (A4C):   16.8 ml 10.18 ml/m LA Biplane Vol: 23.0 ml 13.94 ml/m  AORTIC VALVE AV Area (Vmax):    1.00 cm AV Area (Vmean):   1.11 cm AV Area (VTI):     1.02 cm AV Vmax:           172.00 cm/s AV Vmean:          112.000 cm/s AV VTI:            0.297 m AV Peak Grad:      11.8 mmHg AV Mean Grad:      6.0 mmHg LVOT Vmax:         75.45 cm/s LVOT Vmean:        54.900 cm/s LVOT VTI:          0.133 m LVOT/AV VTI ratio: 0.45 AI PHT:            231 msec  AORTA Ao Root diam: 3.10 cm Ao Asc diam:  3.40 cm MITRAL VALVE                TRICUSPID VALVE MV Area (PHT): 4.60 cm     TR Peak grad:   39.9 mmHg MV Decel Time: 165 msec     TR Vmax:        316.00 cm/s MV E velocity: 146.00 cm/s MV A velocity: 162.00 cm/s  SHUNTS MV E/A ratio:  0.90         Systemic VTI:  0.13 m  Systemic Diam: 1.70 cm Kate Sable MD Electronically signed by Kate Sable MD Signature Date/Time: 12/28/2020/3:13:23 PM    Final    ASSESSMENT AND PLAN:   Pier Ramos is a 87 y.o. 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  Patient reports for the past couple weeks she had a cough but without fever or chills and then developed chest pain in the past few days.  Shortness of breath and acute respiratory failure with hypoxia -- negative for flu  and COVID -- chest x-ray repeat shows worsening infiltrate left lung pneumonia versus pulmonary edema. -- Given elevated white count and cough with history of bronchitis will treat for pneumonia --Procalcitonin <0.01 -prn cough meds  NSTEMI (non-ST elevated myocardial infarction) (HCC) -Heparin infusion for 48 hours - Aspirin, metoprolol, atorvastatin - Cardiology consult with [facility] appreciated--cont conservative mnx    Acute on chronic diastolic CHF (congestive heart failure) ([place]) - IV Lasix--changed to po lasix by Cards from 12/15 - Metoprolol - Daily weights with intake and output monitoring     AKI (acute kidney injury) ([place]) - Monitor renal function and avoid nephrotoxins -creat back to baseline 1.0     Hyperglycemia due to type 2 diabetes mellitus (HCC) - Sliding scale insulin coverage    s/p  Pacemaker recently for high grade AV block  generalized weakness with deconditioning -- PT to see patient. -- TOC for discharge planning  palliative care consultation. Patient has multiple comorbidities. She is a DNR. Discussed with daughter at bedside she wishes to take patient home with outpatient palliative/hospice   Procedures: Family communication : daughter Amy Calhoun Consults : [facility] cardiology CODE STATUS: DNR DVT Prophylaxis :scd/heparin gtt Level of care: Progressive Status is: Inpatient  Remains inpatient appropriate because: chf,pna        TOTAL TIME TAKING CARE OF THIS PATIENT: 30 minutes.  >50% time spent on counselling and coordination of care  Note: This dictation was prepared with Dragon dictation along with smaller phrase technology. Any transcriptional errors that result from this process are unintentional.  Fritzi Mandes M.D    [facility]   CC: Primary care physician; Burnard Hawthorne, FNP Patient ID: Amy Calhoun, female   DOB: 1928/12/31, 87 y.o.   MRN: [id number]

## 2020-12-28 NOTE — Progress Notes (Signed)
ANTICOAGULATION CONSULT NOTE  Pharmacy Consult for heparin infusion Indication: ACS/STEMI  Patient Measurements: Heparin Dosing Weight: 60.8 kg  Labs: Recent Labs    12/27/20 0014 12/27/20 0242 12/27/20 0358 12/27/20 0703 12/27/20 1219 12/27/20 2257  HGB 9.6*  --   --  10.1*  --   --   HCT 29.7*  --   --  31.4*  --   --   PLT 308  --   --  339  --   --   APTT  --   --  33  --   --   --   LABPROT  --   --  13.2  --   --   --   INR  --   --  1.0  --   --   --   HEPARINUNFRC  --   --   --   --  0.25* 0.16*  CREATININE 1.31*  --   --  1.22*  --   --   TROPONINIHS 124* 803*  --   --   --   --      Estimated Creatinine Clearance: 26.5 mL/min (A) (by C-G formula based on SCr of 1.22 mg/dL (H)).   Medical History: Past Medical History:  Diagnosis Date   Allergy    Ambulates with cane    Anemia    At high risk for falls    Cataract    removed both eyes   Chronic kidney disease 08/07/2016   Chronic renal failure, stage III   Depression    Esophageal dysphagia    Gastric ulcer    GERD (gastroesophageal reflux disease)    Hyperlipidemia    Hypertension    Hypothyroidism    IDDM (insulin dependent diabetes mellitus)    Ischemic colitis (HCC)    Neuromuscular disorder (HCC)    neuropathy   Osteoarthritis    Ulcer of esophagus    Assessment: Pt is 87 yo female w/ recent pacemaker (~ 6 wks) presenting to ED c/o CP x 4 days, found with elevated Troponin I, trending up.  Goal of Therapy:  Heparin level 0.3-0.7 units/ml Monitor platelets by anticoagulation protocol: Yes  12/13 1219 HL 0.25 12/13 2257 HL 0.16   Plan:  --Heparin level is subtherapeutic --Insurance claims handler, not aware of any stoppages or line issues --Heparin 1800 unit IV bolus and increase heparin infusion rate to 850 units/hr --Re-check HL 8 hours after rate change --Daily CBC per protocol --Per chart review, plan for IV heparin x 48 hours per cardiology. No plan for invasive intervention at this  point  Otelia Sergeant, PharmD, MBA 12/28/2020 1:00 AM

## 2020-12-28 NOTE — Progress Notes (Signed)
ANTICOAGULATION CONSULT NOTE  Pharmacy Consult for heparin infusion Indication: ACS/STEMI  Patient Measurements: Heparin Dosing Weight: 60.8 kg  Labs: Recent Labs    12/27/20 0014 12/27/20 0242 12/27/20 0358 12/27/20 0703 12/27/20 1219 12/27/20 2257 12/28/20 0844  HGB 9.6*  --   --  10.1*  --   --  10.8*  HCT 29.7*  --   --  31.4*  --   --  32.9*  PLT 308  --   --  339  --   --  350  APTT  --   --  33  --   --   --   --   LABPROT  --   --  13.2  --   --   --   --   INR  --   --  1.0  --   --   --   --   HEPARINUNFRC  --   --   --   --  0.25* 0.16* 0.30  CREATININE 1.31*  --   --  1.22*  --   --  1.00  TROPONINIHS 124* 803*  --   --   --   --  622*     Estimated Creatinine Clearance: 32.3 mL/min (by C-G formula based on SCr of 1 mg/dL).   Medical History: Past Medical History:  Diagnosis Date   Allergy    Ambulates with cane    Anemia    At high risk for falls    Cataract    removed both eyes   Chronic kidney disease 08/07/2016   Chronic renal failure, stage III   Depression    Esophageal dysphagia    Gastric ulcer    GERD (gastroesophageal reflux disease)    Hyperlipidemia    Hypertension    Hypothyroidism    IDDM (insulin dependent diabetes mellitus)    Ischemic colitis (HCC)    Neuromuscular disorder (HCC)    neuropathy   Osteoarthritis    Ulcer of esophagus    Assessment: Pt is 87 yo female w/ recent pacemaker (~ 6 wks) presenting to ED c/o CP x 4 days, found with elevated Troponin I, trending up.  Goal of Therapy:  Heparin level 0.3-0.7 units/ml Monitor platelets by anticoagulation protocol: Yes  12/13 1219 HL 0.25 12/13 2257 HL 0.16 12/14 0844 HL 0.3    Plan:  Heparin level is therapeutic but at low limit of goal. Will increase heparin infusion to 1100 units/hr. Recheck heparin level in 8 hours. CBC daily while on heparin. Plan for heparin for 48 hours.    Paschal Dopp, PharmD,  12/28/2020 9:55 AM

## 2020-12-28 NOTE — Evaluation (Signed)
Physical Therapy Evaluation Patient Details Name: Amy Calhoun MRN: [id number] DOB: Jan 25, 1928 Today's Date: 12/28/2020  History of Present Illness  Amy Calhoun is a 89yoF who comes to [facility] on 12/26/20 from home with CP.   BNP: 1034, High sensivity cardia troponin 124-823. Pt admitted for NSTEMI. PMH: DM, HTN, CAD, dCHF, PPM 10/11/20. Heparin started 12/13 meeting goal on 12/14.  Clinical Impression  Pt admitted with above diagnosis. Pt currently with functional limitations due to the deficits listed below (see "PT Problem List"). Upon entry, pt in bed, awake and agreeable to participate. The pt is alert, pleasant, interactive, and able to provide some info regarding prior level of function, but not much. HOH is a factor, but I suspect baseline cognitive impairment may also contribute. DTR contacted on phone, reports she'll be here at 1500 and prefers to discuss at that point. Pt moves fairly well to EOB, also able to stand, neither requiring physical assist, but 1 LOB standing 2nd time. Tele box malfunction and lack of socks required attention and time early in session, and sustained tachycardia at 117 BPM is what precluded any further upright activity or AMB. Pt left in bed at end of session, be din cahir position, HOB >50 degrees, room air at 93%, HR in 70s per tele box. Pt says she is comfortable, all needs met. RN in room at exit. Patient's performance this date reveals decreased ability, independence, and tolerance in performing all basic mobility required for performance of activities of daily living. Pt requires additional DME, close physical assistance, and cues for safe participate in mobility. Pt will benefit from skilled PT intervention to increase independence and safety with basic mobility in preparation for discharge to the venue listed below.          Recommendations for follow up therapy are one component of a multi-disciplinary discharge planning process, led by the attending  physician.  Recommendations may be updated based on patient status, additional functional criteria and insurance authorization.  Follow Up Recommendations Skilled nursing-short term rehab (<3 hours/day)    Assistance Recommended at Discharge Intermittent Supervision/Assistance  Functional Status Assessment Patient has had a recent decline in their functional status and demonstrates the ability to make significant improvements in function in a reasonable and predictable amount of time.  Equipment Recommendations  None recommended by PT    Recommendations for Other Services       Precautions / Restrictions Precautions Precautions: Fall Restrictions Weight Bearing Restrictions: No      Mobility  Bed Mobility Overal bed mobility: Needs Assistance Bed Mobility: Supine to Sit;Sit to Supine     Supine to sit: Supervision Sit to supine: Supervision        Transfers Overall transfer level: Needs assistance Equipment used: Quad cane Transfers: Sit to/from Stand Sit to Stand: Supervision;Min guard           General transfer comment: performed twice failure to launch on second attempt, falls into short sitting.    Ambulation/Gait                  Stairs            Wheelchair Mobility    Modified Rankin (Stroke Patients Only)       Balance Overall balance assessment: Mild deficits observed, not formally tested (brief dizziness upon standing, but pt reports to feel close to baseline after standing x90 seconds.)  Pertinent Vitals/Pain Pain Assessment: No/denies pain (CP with deep breathing, improved with breathing treatments per pt)    Home Living Family/patient expects to be discharged to:: Unsure (pt gives conflicting information, medical record not helpful, will connect with DTR later in day)                        Prior Function                       Hand Dominance         Extremity/Trunk Assessment                Communication      Cognition Arousal/Alertness: Awake/alert Behavior During Therapy: WFL for tasks assessed/performed Overall Cognitive Status: History of cognitive impairments - at baseline                                          General Comments      Exercises     Assessment/Plan    PT Assessment Patient needs continued PT services  PT Problem List Decreased strength;Decreased range of motion;Decreased activity tolerance;Decreased balance;Decreased mobility;Decreased coordination;Decreased cognition;Decreased knowledge of use of DME;Decreased safety awareness;Cardiopulmonary status limiting activity;Decreased knowledge of precautions       PT Treatment Interventions DME instruction;Balance training;Gait training;Stair training;Neuromuscular re-education;Functional mobility training;Therapeutic activities;Therapeutic exercise;Patient/family education    PT Goals (Current goals can be found in the Care Plan section)  Acute Rehab PT Goals Patient Stated Goal: wants chest to not hurt PT Goal Formulation: With patient Time For Goal Achievement: 01/11/21 Potential to Achieve Goals: Good    Frequency Min 2X/week   Barriers to discharge        Co-evaluation               AM-PAC PT "6 Clicks" Mobility  Outcome Measure Help needed turning from your back to your side while in a flat bed without using bedrails?: None Help needed moving from lying on your back to sitting on the side of a flat bed without using bedrails?: None Help needed moving to and from a bed to a chair (including a wheelchair)?: A Little Help needed standing up from a chair using your arms (e.g., wheelchair or bedside chair)?: A Little Help needed to walk in hospital room?: A Little Help needed climbing 3-5 steps with a railing? : A Little 6 Click Score: 20    End of Session Equipment Utilized During Treatment: Oxygen Activity  Tolerance: Patient tolerated treatment well;Treatment limited secondary to medical complications (Comment) (sustaining at 117-118 while standing/sitting EOB;) Patient left: in bed;with nursing/sitter in room;with call bell/phone within reach Nurse Communication: Mobility status (tele box not working, no central monitoring) PT Visit Diagnosis: Difficulty in walking, not elsewhere classified (R26.2);Unsteadiness on feet (R26.81);Other abnormalities of gait and mobility (R26.89);Muscle weakness (generalized) (M62.81)    Time: 3086-5784 PT Time Calculation (min) (ACUTE ONLY): 34 min   Charges:   PT Evaluation $PT Eval Moderate Complexity: 1 Mod PT Treatments $Therapeutic Activity: 8-22 mins       2:23 PM, 12/28/20 Etta Grandchild, PT, DPT Physical Therapist - [facility]  [phone] ([place])   [place] 12/28/2020, 2:14 PM

## 2020-12-28 NOTE — Progress Notes (Signed)
Speech Language Pathology Treatment: Dysphagia  Patient Details Name: Amy Calhoun MRN: [id number] DOB: 01/09/1929 Today's Date: 12/28/2020 Time: 2202-5427 SLP Time Calculation (min) (ACUTE ONLY): 45 min  Assessment / Plan / Recommendation Clinical Impression  Pt seen for ongoing assessment of swallowing. She was alert, awake and verbally responsive engaging in conversation w/ family members in room. Pt is on [place] O2 support. Suspect ongoing neuromuscular deficits w/ impact from both medical and cognitive issues affecting her overall  swallowing safety and engagement during po tasks -- this increases risk for aspiration and pulmonary decline. Pt required min+ verbal/visual cues during bolus presentation, but followed through w/ tasks and self-feeding well given verbal cues and support.   Pt and Family explained general aspiration precautions and need for use of Nectar consistency liquids at this time -- time to allow pt to improve medically. Instruction and encouragement given to allow pt hold Cup to feed self as this improves her Safety w/ oral intake. Family agreed; supported pt w/ feeding. Pt was positioned upright by Family; small bites were fed to her allowing her Time to chew and clear mouth b/t bites as instructed. She consumed trials of Nectar liquids via Cup and soft solids w/ NO overt clinical s/s of aspiration noted w/ any consistency; respiratory status remained calm and unlabored, vocal quality clear b/t trials. Oral phase appeared [facility] for bolus management and timely A-P transfer for swallowing; oral clearing achieved w/ all consistencies. NSG denied any deficits in swallowing as well. Educated Family on need to remain on diet at this time as she continues to medically improve and strengthen w/ trials of thin liquids next 1-2 days.    Pt appears at reduced risk for aspriation when following general aspiration precautions and using a Modified diet consistency currently. Recommend dysphagia  level 3 diet(cut meats, moistened) w/ Nectar consistency liquids via Cup -- pt to hold herself to drink; aspiration precautions; Pills Whole vs Crushed in puree for safety; Tray setup at meals w/ feeding support and supervision at meals as needed, reduce Distractions during meals. NSG/MD updated. NSG updated. Precautions posted at bedside.      HPI HPI: Pt is a 87 y.o. 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  Patient reports for the past couple weeks she had a cough but without fever or chills and then developed chest pain in the past few days.  The pain is of moderate intensity nonradiating and is constant.  No aggravating or alleviating factors except for exertion.  Denies nausea, vomiting, diaphoresis and palpitations.  Pt admitted for NSTEMI w/ Acute on chronic diastolic CHF and AKI.  Previous HEAD CT 10/2020: "Global  parenchymal volume loss. Periventricular white matter hypodensities  consistent with sequela of chronic microvascular ischemic disease.".  CXR: Markedly increased interstitial and airspace disease, left greater  than right, favored to be severe pulmonary edema.      SLP Plan  Continue with current plan of care      Recommendations for follow up therapy are one component of a multi-disciplinary discharge planning process, led by the attending physician.  Recommendations may be updated based on patient status, additional functional criteria and insurance authorization.    Recommendations  Diet recommendations: Dysphagia 3 (mechanical soft);Nectar-thick liquid Liquids provided via: Cup;No straw Medication Administration: Whole meds with puree (vs  need to Crush) Supervision: Patient able to self feed;Staff to assist with self feeding;Intermittent supervision to cue for compensatory  strategies Compensations: Minimize environmental distractions;Slow rate;Small sips/bites;Lingual sweep for clearance of pocketing;Follow solids with liquid Postural Changes and/or Swallow Maneuvers: Out of bed for meals;Seated upright 90 degrees;Upright 30-60 min after meal                General recommendations:  (Dietician f/u; Palliative Care consult for [place]) Oral Care Recommendations: Oral care BID;Oral care before and after PO;Staff/trained caregiver to provide oral care Follow Up Recommendations: Skilled nursing-short term rehab (<3 hours/day) (TBD) Assistance recommended at discharge: Intermittent Supervision/Assistance SLP Visit Diagnosis: Dysphagia, oropharyngeal phase (R13.12) Plan: Continue with current plan of care             Orinda Kenner, [place], [place] Pathologist Rehab Services [phone] [facility]  12/28/2020, 4:08 PM

## 2020-12-28 NOTE — Progress Notes (Signed)
ANTICOAGULATION CONSULT NOTE  Pharmacy Consult for heparin infusion Indication: ACS/STEMI  Patient Measurements: Heparin Dosing Weight: 60.8 kg  Labs: Recent Labs    12/27/20 0014 12/27/20 0242 12/27/20 0358 12/27/20 0703 12/27/20 1219 12/27/20 2257 12/28/20 0844 12/28/20 1128 12/28/20 1757  HGB 9.6*  --   --  10.1*  --   --  10.8*  --   --   HCT 29.7*  --   --  31.4*  --   --  32.9*  --   --   PLT 308  --   --  339  --   --  350  --   --   APTT  --   --  33  --   --   --   --   --   --   LABPROT  --   --  13.2  --   --   --   --   --   --   INR  --   --  1.0  --   --   --   --   --   --   HEPARINUNFRC  --   --   --   --    < > 0.16* 0.30  --  0.33  CREATININE 1.31*  --   --  1.22*  --   --  1.00  --   --   TROPONINIHS 124* 803*  --   --   --   --  622* 574*  --    < > = values in this interval not displayed.     Estimated Creatinine Clearance: 32.3 mL/min (by C-G formula based on SCr of 1 mg/dL).   Medical History: Past Medical History:  Diagnosis Date   Allergy    Ambulates with cane    Anemia    At high risk for falls    Cataract    removed both eyes   Chronic kidney disease 08/07/2016   Chronic renal failure, stage III   Depression    Esophageal dysphagia    Gastric ulcer    GERD (gastroesophageal reflux disease)    Hyperlipidemia    Hypertension    Hypothyroidism    IDDM (insulin dependent diabetes mellitus)    Ischemic colitis (HCC)    Neuromuscular disorder (HCC)    neuropathy   Osteoarthritis    Ulcer of esophagus    Assessment: Pt is 87 yo female w/ recent pacemaker (~ 6 wks) presenting to ED c/o CP x 4 days, found with elevated Troponin I, trending up.  Goal of Therapy:  Heparin level 0.3-0.7 units/ml Monitor platelets by anticoagulation protocol: Yes  12/13 1219 HL 0.25 12/13 2257 HL 0.16 12/14 0844 HL 0.3  12/14 1757 HL 0.33   Plan:  Heparin level remains therapeutic but at low limit of goal. Continue heparin infusion at 1100  units/hr. Recheck heparin level with AM labs. CBC daily while on heparin. Plan for heparin for 48 hours.    Sharen Hones, PharmD, BCPS Clinical Pharmacist   12/28/2020 6:48 PM

## 2020-12-28 NOTE — TOC Initial Note (Signed)
Transition of Care [facility]) - Initial/Assessment Note    Patient Details  Name: Amy Calhoun MRN: [id number] Date of Birth: [birth year]  Transition of Care [facility]) CM/SW Contact:    Maree Krabbe, LCSW Phone Number: 12/28/2020, 3:06 PM  Clinical Narrative:   CSW spoke with pt's daughter. Per Pt's daughter after convo with Dr Allena Katz and Palliative, they will give pt until Friday to see progress. At that time it will be home with services versus home with hospice. CSW told pt's daughter she would reconnect with her on Friday.                Expected Discharge Plan:  (to be determined) Barriers to Discharge: Continued Medical Work up   Patient Goals and CMS Choice Patient states their goals for this hospitalization and ongoing recovery are:: unsure at this time   Choice offered to / list presented to : Adult Children  Expected Discharge Plan and Services Expected Discharge Plan:  (to be determined) In-house Referral: Clinical Social Work   Post Acute Care Choice:  (undetermined at this christmas) Living arrangements for the past 2 months: Single Family Home                                      Prior Living Arrangements/Services Living arrangements for the past 2 months: Single Family Home Lives with:: Adult Children Patient language and need for interpreter reviewed:: Yes Do you feel safe going back to the place where you live?: Yes      Need for Family Participation in Patient Care: Yes (Comment) Care giver support system in place?: Yes (comment)   Criminal Activity/Legal Involvement Pertinent to Current Situation/Hospitalization: No - Comment as needed  Activities of Daily Living      Permission Sought/Granted Permission sought to share information with : Family Supports Permission granted to share information with : Yes, Release of Information Signed  Share Information with NAME: [name]     Permission granted to share info w Relationship: daughter/POA      Emotional Assessment Appearance:: Appears stated age Attitude/Demeanor/Rapport: Unable to Assess Affect (typically observed): Unable to Assess Orientation: :  (not noted) Alcohol / Substance Use: Not Applicable Psych Involvement: No (comment)  Admission diagnosis:  SOB (shortness of breath) [R06.02] NSTEMI (non-ST elevated myocardial infarction) (HCC) [I21.4] Patient Active Problem List   Diagnosis Date Noted   Pressure injury of skin 12/28/2020   NSTEMI (non-ST elevated myocardial infarction) (HCC) 12/27/2020   Acute CHF (congestive heart failure) (HCC) 12/27/2020   Pacemaker 12/27/2020   Bronchitis 12/21/2020   Pressure ulcer 12/21/2020   Polypharmacy 12/07/2020   Coronary artery disease involving native coronary artery of native heart without angina pectoris 11/18/2020   Acute on chronic diastolic CHF (congestive heart failure) (HCC) 10/15/2020   Malnutrition of moderate degree 10/12/2020   Heart block AV complete (HCC)    Symptomatic bradycardia 10/10/2020   Syncope 10/10/2020   Redness of skin 06/29/2020   Recurrent falls 06/06/2020   Abnormal gait 06/06/2020   Dizziness 06/06/2020   Right wrist pain 03/18/2020   B12 deficiency 03/11/2020   Hyperglycemia due to type 2 diabetes mellitus (HCC) 11/16/2019   Type 2 diabetes mellitus with stage 3a chronic kidney disease, with long-term current use of insulin (HCC) 11/16/2019   DM type 2 with diabetic peripheral neuropathy (HCC) 11/16/2019   Depression    Weakness of both lower extremities  Fatigue 02/13/2019   Head injury 02/04/2019   Acute hypoxemic respiratory failure due to COVID-19 [facility]) 01/28/2019   Gastroesophageal reflux disease without esophagitis    Insomnia 01/05/2019   Constipation 06/02/2018   Acute pain of left shoulder 05/23/2018   Diarrhea 05/23/2018   Pneumatosis coli 07/29/2017   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   Memory disturbance 03/27/2017   Recurrent urinary tract infection 10/17/2016   Lumbar  stenosis with neurogenic claudication 08/10/2016   Acute respiratory failure with hypoxia (HCC) 07/12/2016   Anemia 07/06/2016   AKI (acute kidney injury) (HCC) 04/05/2016   Shortness of breath 03/31/2016   Osteoarthritis of spine with radiculopathy, lumbar region 03/16/2016   History of colitis 03/02/2016   Primary osteoarthritis of left hip 03/02/2016   CKD (chronic kidney disease) stage 3, GFR 30-59 ml/min (HCC) 02/21/2016   DM (diabetes mellitus), type 2 with renal complications (HCC) 01/19/2016   Hyperlipidemia associated with type 2 diabetes mellitus (HCC) 01/19/2016   Hypothyroidism 01/19/2016   Essential hypertension 01/19/2016   Chronic pain 01/19/2016   Esophageal dysmotility 12/21/2015   PCP:  Allegra Grana, FNP Pharmacy:   [facility] [phone] [place] Rough, [place] - [address] [place] [address] Phone: [phone] Fax: [phone]     Social Determinants of Health (SDOH) Interventions    Readmission Risk Interventions No flowsheet data found.

## 2020-12-28 NOTE — Plan of Care (Signed)
Problem: Education: Goal: Knowledge of General Education information will improve Description: Including pain rating scale, medication(s)/side effects and non-pharmacologic comfort measures Outcome: Progressing   Problem: Health Behavior/Discharge Planning: Goal: Ability to manage health-related needs will improve Outcome: Not Applicable   Problem: Clinical Measurements: Goal: Ability to maintain clinical measurements within normal limits will improve Outcome: Progressing Goal: Will remain free from infection Outcome: Progressing Goal: Diagnostic test results will improve Outcome: Completed/Met Goal: Respiratory complications will improve Outcome: Progressing Goal: Cardiovascular complication will be avoided Outcome: Completed/Met   Problem: Clinical Measurements: Goal: Will remain free from infection Outcome: Progressing   Problem: Clinical Measurements: Goal: Diagnostic test results will improve Outcome: Completed/Met   Problem: Clinical Measurements: Goal: Respiratory complications will improve Outcome: Progressing   Problem: Clinical Measurements: Goal: Cardiovascular complication will be avoided Outcome: Completed/Met   Problem: Activity: Goal: Risk for activity intolerance will decrease Outcome: Progressing   Problem: Nutrition: Goal: Adequate nutrition will be maintained Outcome: Progressing   Problem: Coping: Goal: Level of anxiety will decrease Outcome: Completed/Met   Problem: Elimination: Goal: Will not experience complications related to bowel motility Outcome: Completed/Met Goal: Will not experience complications related to urinary retention Outcome: Completed/Met   Problem: Elimination: Goal: Will not experience complications related to urinary retention Outcome: Completed/Met   Problem: Pain Managment: Goal: General experience of comfort will improve Outcome: Completed/Met   Problem: Skin Integrity: Goal: Risk for impaired skin  integrity will decrease Outcome: Progressing

## 2020-12-29 ENCOUNTER — Telehealth: Payer: Self-pay | Admitting: Family

## 2020-12-29 DIAGNOSIS — N1831 Chronic kidney disease, stage 3a: Secondary | ICD-10-CM

## 2020-12-29 DIAGNOSIS — N179 Acute kidney failure, unspecified: Secondary | ICD-10-CM

## 2020-12-29 DIAGNOSIS — E785 Hyperlipidemia, unspecified: Secondary | ICD-10-CM

## 2020-12-29 DIAGNOSIS — E1169 Type 2 diabetes mellitus with other specified complication: Secondary | ICD-10-CM

## 2020-12-29 DIAGNOSIS — I10 Essential (primary) hypertension: Secondary | ICD-10-CM

## 2020-12-29 DIAGNOSIS — Z950 Presence of cardiac pacemaker: Secondary | ICD-10-CM

## 2020-12-29 DIAGNOSIS — I5033 Acute on chronic diastolic (congestive) heart failure: Secondary | ICD-10-CM

## 2020-12-29 LAB — GLUCOSE, CAPILLARY
Glucose-Capillary: 286 mg/dL — ABNORMAL HIGH (ref 70–99)
Glucose-Capillary: 261 mg/dL — ABNORMAL HIGH (ref 70–99)
Glucose-Capillary: 436 mg/dL — ABNORMAL HIGH (ref 70–99)
Glucose-Capillary: 391 mg/dL — ABNORMAL HIGH (ref 70–99)
Glucose-Capillary: 345 mg/dL — ABNORMAL HIGH (ref 70–99)

## 2020-12-29 LAB — HEPARIN LEVEL (UNFRACTIONATED): Heparin Unfractionated: 0.22 IU/mL — ABNORMAL LOW (ref 0.30–0.70)

## 2020-12-29 LAB — CBC
HCT: 28.7 % — ABNORMAL LOW (ref 36.0–46.0)
Hemoglobin: 9.4 g/dL — ABNORMAL LOW (ref 12.0–15.0)
MCH: 28.9 pg (ref 26.0–34.0)
MCHC: 32.8 g/dL (ref 30.0–36.0)
MCV: 88.3 fL (ref 80.0–100.0)
Platelets: 320 10*3/uL (ref 150–400)
RBC: 3.25 MIL/uL — ABNORMAL LOW (ref 3.87–5.11)
RDW: 13.2 % (ref 11.5–15.5)
WBC: 12.9 10*3/uL — ABNORMAL HIGH (ref 4.0–10.5)
nRBC: 0 % (ref 0.0–0.2)

## 2020-12-29 MED ORDER — LOSARTAN POTASSIUM 25 MG PO TABS
25.0000 mg | ORAL_TABLET | Freq: Every day | ORAL | Status: DC
  Filled 2020-12-29: qty 1

## 2020-12-29 MED ORDER — INSULIN GLARGINE-YFGN 100 UNIT/ML ~~LOC~~ SOLN
10.0000 [IU] | Freq: Every day | SUBCUTANEOUS | Status: DC
  Administered 2020-12-29 – 2020-12-30 (×2): 10 [IU] via SUBCUTANEOUS
  Filled 2020-12-29 (×2): qty 0.1

## 2020-12-29 MED ORDER — LOSARTAN POTASSIUM 50 MG PO TABS
50.0000 mg | ORAL_TABLET | Freq: Every day | ORAL | Status: DC
  Administered 2020-12-29 – 2020-12-30 (×2): 50 mg via ORAL
  Filled 2020-12-29: qty 1

## 2020-12-29 MED ORDER — ISOSORBIDE MONONITRATE ER 30 MG PO TB24
30.0000 mg | ORAL_TABLET | Freq: Every day | ORAL | Status: DC
  Administered 2020-12-29 – 2020-12-30 (×2): 30 mg via ORAL
  Filled 2020-12-29 (×2): qty 1

## 2020-12-29 MED ORDER — METOPROLOL TARTRATE 25 MG PO TABS
25.0000 mg | ORAL_TABLET | Freq: Two times a day (BID) | ORAL | Status: DC
  Administered 2020-12-29 – 2020-12-30 (×3): 25 mg via ORAL
  Filled 2020-12-29 (×3): qty 1

## 2020-12-29 NOTE — Progress Notes (Addendum)
Physical Therapy Treatment Patient Details Name: Amy Calhoun MRN: [id number] DOB: 26-May-1928 Today's Date: 12/29/2020   History of Present Illness Amy Calhoun is a 89yoF who comes to [facility] on 12/26/20 from home with CP.   BNP: 1034, High sensivity cardia troponin 124-823. Pt admitted for NSTEMI. PMH: DM, HTN, CAD, dCHF, PPM 10/11/20. Heparin started 12/13 meeting goal on 12/14.    PT Comments    Pt in room upon entry, O2 donned at 1L/min. 30 degree restriction in place on bed, but pt has slid down par enough to have trunk at closer to 10 degrees. Pt assist to EOB, prepared for AMB. No assist needed in bed mobility, minA for transfers with QC, balance off quite a bit and QC not providing much utility. Pt able to AMB ~11ft but is limited by fatigue as well as SOB, post AMB SpO2 at 85% on room air, after 5 seated min @ 2L/min sats at 94%. HR and BP well controlled today, HR in 70s while up. Pt has more coughing, sounds much more wet than yesterday. Pt left sitting tall in bed at EOS, HOB >50 degrees, bed alarm on. Pt able to sat on room air yesterday, today requires supplemental for adequate sats.      Recommendations for follow up therapy are one component of a multi-disciplinary discharge planning process, led by the attending physician.  Recommendations may be updated based on patient status, additional functional criteria and insurance authorization.  Follow Up Recommendations  Skilled nursing-short term rehab (<3 hours/day)     Assistance Recommended at Discharge Intermittent Supervision/Assistance  Equipment Recommendations  Rolling walker (2 wheels)    Recommendations for Other Services       Precautions / Restrictions Precautions Precautions: Fall Restrictions Weight Bearing Restrictions: No     Mobility  Bed Mobility Overal bed mobility: Modified Independent Bed Mobility: Supine to Sit;Sit to Supine     Supine to sit: Supervision Sit to supine: Supervision         Transfers Overall transfer level: Needs assistance Equipment used: Quad cane Transfers: Sit to/from Stand Sit to Stand: Min guard;Min assist           General transfer comment: LOB each time, slightly more weak today, less coordinated    Ambulation/Gait Ambulation/Gait assistance: Min assist Gait Distance (Feet): 60 Feet Assistive device: Quad cane Gait Pattern/deviations: Staggering right;Staggering left       General Gait Details: Does not use QC in a safe or utilitarian manner, not sequenced with gait cycle, does not use for LOB correction due to limited awareness; reports to be tired after AMB 54ft, noted SpO2 85% on room air;   Stairs             Wheelchair Mobility    Modified Rankin (Stroke Patients Only)       Balance                                            Cognition Arousal/Alertness: Awake/alert Behavior During Therapy: WFL for tasks assessed/performed Overall Cognitive Status: Within Functional Limits for tasks assessed                                          Exercises      General Comments  Pertinent Vitals/Pain Pain Assessment: No/denies pain    Home Living                          Prior Function            PT Goals (current goals can now be found in the care plan section) Acute Rehab PT Goals Patient Stated Goal: wants chest to not hurt PT Goal Formulation: With patient Time For Goal Achievement: 01/11/21 Potential to Achieve Goals: Good Progress towards PT goals: Progressing toward goals    Frequency    Min 2X/week      PT Plan Current plan remains appropriate;Equipment recommendations need to be updated    Co-evaluation              AM-PAC PT "6 Clicks" Mobility   Outcome Measure  Help needed turning from your back to your side while in a flat bed without using bedrails?: None Help needed moving from lying on your back to sitting on the side of a  flat bed without using bedrails?: None Help needed moving to and from a bed to a chair (including a wheelchair)?: A Lot Help needed standing up from a chair using your arms (e.g., wheelchair or bedside chair)?: A Lot Help needed to walk in hospital room?: A Lot Help needed climbing 3-5 steps with a railing? : A Lot 6 Click Score: 16    End of Session Equipment Utilized During Treatment: Oxygen Activity Tolerance: Patient tolerated treatment well;Treatment limited secondary to medical complications (Comment) Patient left: in bed;with nursing/sitter in room;with call bell/phone within reach Nurse Communication: Mobility status (unbsteadiness, desaturation) PT Visit Diagnosis: Difficulty in walking, not elsewhere classified (R26.2);Unsteadiness on feet (R26.81);Other abnormalities of gait and mobility (R26.89);Muscle weakness (generalized) (M62.81)     Time: 5790-3833 PT Time Calculation (min) (ACUTE ONLY): 20 min  Charges:  $Therapeutic Activity: 8-22 mins                    12:02 PM, 12/29/20 Rosamaria Lints, PT, DPT Physical Therapist - [facility]  [phone] (ASCOM)     [name] 12/29/2020, 12:02 PM

## 2020-12-29 NOTE — Progress Notes (Signed)
Progress Note  Patient Name: Amy Calhoun Date of Encounter: 12/29/2020  [facility] Cardiologist: Nelva Bush, MD   Patient Profile     87 y.o. female with PMH notable for CHB s/p PPM, CAD (diffuse RCA disease), HTN, HLD, HFpEF who was admitted to [facility] with ACS/non-STEMI.  Plan for medical management for 48 hours heparin.  Subjective   No more chest pain, still short of breath.  More GERD than anything else.  Inpatient Medications    Scheduled Meds:  ascorbic acid  500 mg Oral Daily   aspirin EC  81 mg Oral Daily   benzonatate  100 mg Oral TID   cholecalciferol  2,000 Units Oral Daily   docusate sodium  100 mg Oral BID   ezetimibe  10 mg Oral Daily   feeding supplement (NEPRO CARB STEADY)  237 mL Oral TID BM   ferrous sulfate  325 mg Oral Q breakfast   furosemide  20 mg Oral Daily   insulin aspart  0-15 Units Subcutaneous TID WC   insulin aspart  0-5 Units Subcutaneous QHS   insulin glargine-yfgn  7 Units Subcutaneous Daily   isosorbide mononitrate  30 mg Oral Daily   losartan  25 mg Oral Daily   metoprolol tartrate  25 mg Oral BID   mometasone-formoterol  2 puff Inhalation BID   multivitamin with minerals  1 tablet Oral Daily   Continuous Infusions:  azithromycin 500 mg (12/28/20 1340)   cefTRIAXone (ROCEPHIN)  IV 2 g (12/28/20 1251)   PRN Meds: acetaminophen, acetaminophen-codeine, guaiFENesin, nitroGLYCERIN, ondansetron (ZOFRAN) IV   Vital Signs    Vitals:   12/28/20 2032 12/29/20 0002 12/29/20 0413 12/29/20 0807  BP: (!) 145/53 (!) 149/60 (!) 157/79 (!) 175/70  Pulse: 95 90 78 82  Resp: _0 Temp: 98.3 F (36.8 C) 98.7 F (37.1 C) 98.5 F (36.9 C) 98.6 F (37 C)  TempSrc: Oral  Oral Oral  SpO2: 97% 96% 96% 94%  Weight:      Height:        Intake/Output Summary (Last 24 hours) at 12/29/2020 0839 Last data filed at 12/29/2020 0700 Gross per 24 hour  Intake 881.1 ml  Output 1200 ml  Net -318.9 ml   Last 3 Weights 12/28/2020  12/27/2020 12/19/2020  Weight (lbs) 124 lb 1.9 oz 134 lb 134 lb  Weight (kg) 56.3 kg 60.782 kg 60.782 kg      Telemetry    V paced rhythm with intermittent PVCs.- Personally Reviewed  ECG    No new EKG- Personally Reviewed  Physical Exam   GEN: No acute distress.  ANO x3.  Thin, frail elderly woman.  Resting in bed, Neck: No JVD, or bruit Cardiac: Distant heart sounds.  RRR, 1-2/6 SEM at RUSB.  No R/G. Respiratory: Diffuse coarse breath sounds with rhonchorous cough.  Nonlabored, good air movement. GI: Soft, nontender, non-distended  MS: No edema; No deformity. Neuro:  Nonfocal  Psych: Normal affect   Labs    High Sensitivity Troponin:   Recent Labs  Lab 12/27/20 0014 12/27/20 0242 12/28/20 0844 12/28/20 1128  TROPONINIHS 124* 803* 622* 574*     Chemistry Recent Labs  Lab 12/27/20 0014 12/27/20 0703 12/28/20 0844  NA 131* 132* 136  K 4.9 4.7 3.8  CL 100 98 97*  CO2 _1 GLUCOSE 424* 417* 186*  BUN 45* 46* 40*  CREATININE 1.31* 1.22* 1.00  CALCIUM 8.5* 8.7* 8.6*  GFRNONAA 39* 42* 54*  ANIONGAP _0 Lipids No results for input(s): CHOL, TRIG, HDL, LABVLDL, LDLCALC, CHOLHDL in the last 168 hours.  Hematology Recent Labs  Lab 12/27/20 0703 12/28/20 0844 12/29/20 0344  WBC 11.2* 16.4* 12.9*  RBC 3.44* 3.68* 3.25*  HGB 10.1* 10.8* 9.4*  HCT 31.4* 32.9* 28.7*  MCV 91.3 89.4 88.3  MCH 29.4 29.3 28.9  MCHC 32.2 32.8 32.8  RDW 13.4 13.3 13.2  PLT 339 350 320   Thyroid No results for input(s): TSH, FREET4 in the last 168 hours.  BNP Recent Labs  Lab 12/27/20 0014  BNP 1,034.4*    DDimer No results for input(s): DDIMER in the last 168 hours.   Radiology    DG Chest Port 1 View  Result Date: 12/27/2020 CLINICAL DATA:  Shortness of breath, chest pain EXAM: PORTABLE CHEST 1 VIEW COMPARISON:  Radiograph 12/27/2020, chest CT 11/05/2020 FINDINGS: Unchanged cardiomediastinal silhouette with dual chamber pacemaker. Markedly increased  interstitial and airspace disease left greater than right. Probable small left pleural effusion. No visible pneumothorax. IMPRESSION: Markedly increased interstitial and airspace disease, left greater than right, favored to be severe pulmonary edema. Electronically Signed   By: Maurine Simmering M.D.   On: 12/27/2020 11:17   ECHOCARDIOGRAM COMPLETE  Result Date: 12/28/2020    ECHOCARDIOGRAM REPORT   Patient Name:   Amy Calhoun Date of Exam: 12/27/2020 Medical Rec #:  [id number]         Height:       64.0 in Accession #:    [id number]        Weight:       134.0 lb Date of Birth:  [birth year]          BSA:          1.650 m Patient Age:    34 years          BP:           158/60 mmHg Patient Gender: F                 HR:           89 bpm. Exam Location:  [facility] Procedure: 2D Echo, Cardiac Doppler and Color Doppler Indications:     Q25.95 Acute Diastolic Heart Failure  History:         Patient has prior history of Echocardiogram examinations, most                  recent 10/11/2020. CAD; Risk Factors:Hypertension, Dyslipidemia                  and Diabetes. Hypothyroidism.  Sonographer:     Cresenciano Lick RDCS Referring Phys:  [id number] Athena Masse Diagnosing Phys: Kate Sable MD IMPRESSIONS  1. Left ventricular ejection fraction, by estimation, is 55 to 60%. The left ventricle has normal function. The left ventricle has no regional wall motion abnormalities. There is mild left ventricular hypertrophy. Left ventricular diastolic parameters are consistent with Grade II diastolic dysfunction (pseudonormalization).  2. Right ventricular systolic function is normal. The right ventricular size is normal.  3. The mitral valve is degenerative. Moderate mitral valve regurgitation. Moderate mitral annular calcification.  4. The aortic valve is tricuspid. Aortic valve regurgitation is mild to moderate. Aortic valve sclerosis/calcification is present, without any evidence of aortic stenosis. FINDINGS  Left Ventricle:  Left ventricular ejection fraction, by estimation, is 55 to 60%. The left ventricle has normal function. The left ventricle has no regional  wall motion abnormalities. The left ventricular internal cavity size was normal in size. There is  mild left ventricular hypertrophy. Left ventricular diastolic parameters are consistent with Grade II diastolic dysfunction (pseudonormalization). Right Ventricle: The right ventricular size is normal. No increase in right ventricular wall thickness. Right ventricular systolic function is normal. Left Atrium: Left atrial size was normal in size. Right Atrium: Right atrial size was normal in size. Pericardium: There is no evidence of pericardial effusion. Mitral Valve: The mitral valve is degenerative in appearance. Moderate mitral annular calcification. Moderate mitral valve regurgitation. Tricuspid Valve: The tricuspid valve is normal in structure. Tricuspid valve regurgitation is mild. Aortic Valve: The aortic valve is tricuspid. Aortic valve regurgitation is mild to moderate. Aortic regurgitation PHT measures 231 msec. Aortic valve sclerosis/calcification is present, without any evidence of aortic stenosis. Aortic valve mean gradient measures 6.0 mmHg. Aortic valve peak gradient measures 11.8 mmHg. Aortic valve area, by VTI measures 1.02 cm. Pulmonic Valve: The pulmonic valve was not well visualized. Pulmonic valve regurgitation is not visualized. Aorta: The aortic root and ascending aorta are structurally normal, with no evidence of dilitation. Venous: The inferior vena cava was not well visualized. IAS/Shunts: No atrial level shunt detected by color flow Doppler.  LEFT VENTRICLE PLAX 2D LVIDd:         4.70 cm   Diastology LVIDs:         3.30 cm   LV e' medial:    5.55 cm/s LV PW:         1.10 cm   LV E/e' medial:  26.3 LV IVS:        1.10 cm   LV e' lateral:   5.77 cm/s LVOT diam:     1.70 cm   LV E/e' lateral: 25.3 LV SV:         30 LV SV Index:   18 LVOT Area:     2.27 cm   RIGHT VENTRICLE RV Basal diam:  3.40 cm RV S prime:     11.70 cm/s TAPSE (M-mode): 1.2 cm LEFT ATRIUM             Index        RIGHT ATRIUM          Index LA diam:        3.60 cm 2.18 cm/m   RA Area:     6.24 cm LA Vol (A2C):   27.7 ml 16.79 ml/m  RA Volume:   10.60 ml 6.42 ml/m LA Vol (A4C):   16.8 ml 10.18 ml/m LA Biplane Vol: 23.0 ml 13.94 ml/m  AORTIC VALVE AV Area (Vmax):    1.00 cm AV Area (Vmean):   1.11 cm AV Area (VTI):     1.02 cm AV Vmax:           172.00 cm/s AV Vmean:          112.000 cm/s AV VTI:            0.297 m AV Peak Grad:      11.8 mmHg AV Mean Grad:      6.0 mmHg LVOT Vmax:         75.45 cm/s LVOT Vmean:        54.900 cm/s LVOT VTI:          0.133 m LVOT/AV VTI ratio: 0.45 AI PHT:            231 msec  AORTA Ao Root diam: 3.10 cm Ao Asc diam:  3.40  cm MITRAL VALVE                TRICUSPID VALVE MV Area (PHT): 4.60 cm     TR Peak grad:   39.9 mmHg MV Decel Time: 165 msec     TR Vmax:        316.00 cm/s MV E velocity: 146.00 cm/s MV A velocity: 162.00 cm/s  SHUNTS MV E/A ratio:  0.90         Systemic VTI:  0.13 m                             Systemic Diam: 1.70 cm Kate Sable MD Electronically signed by Kate Sable MD Signature Date/Time: 12/28/2020/3:13:23 PM    Final     Cardiac Studies   2D Echo 12/27/2020: EF stable at 55 to 60%.  No R WMA.  GRII DD-however left atrial size normal indicating not significant elevated filling pressures.  Normal RVP.Marland Kitchen  Moderate MAC with moderate MR.  AOV sclerosis with no stenosis. ->  No significant change from September 2020  Cardiac Cath 10/11/2020: Severe single-vessel disease with diffuse sequential disease in the RCA.  Medical management.  Temp pacer placed.   Assessment & Plan    Principal Problem:   NSTEMI (non-ST elevated myocardial infarction) ([place]) Active Problems:   Acute on chronic diastolic heart failure (HCC)   DM (diabetes mellitus), type 2 with renal complications (HCC)   Hyperlipidemia associated with type 2  diabetes mellitus (HCC)   Essential hypertension   CKD (chronic kidney disease) stage 3, GFR 30-59 ml/min (HCC)   AKI (acute kidney injury) ([place])   Anemia   Hyperglycemia due to type 2 diabetes mellitus ([place])   Coronary artery disease involving native coronary artery of native heart without angina pectoris   Pacemaker   Pressure injury of skin   Heart failure with preserved ejection fraction ([place])  Non-STEMI with known CAD from cath in September 2022.  Mild troponin elevation in setting of acute on chronic HFpEF (diastolic heart failure) Admitted on 12/27/2020, plan was for 48 hours IV heparin.  Should be complete today.  Would like to see how she does off of heparin with 1 day. Started on low-dose Imdur and low-dose Lopressor.  We will titrate up to 30 mg Imdur and 25 mg twice daily Lopressor.. No plans for invasive evaluation at this point.  RCA was nonsignificant PCI.  Troponin elevation thought to be related to demand ischemia with existing CAD and ongoing illness. Continue aspirin, with ACS presentation, can consider short-term clopidogrel as well.  Defer to primary cardiologist. HLD is on Zetia for lipids.  Not on statin.  Not unreasonable given age. With suspected acute on chronic HFpEF, switched to oral Lasix yesterday. Chronic HFpEF/HTN: Will add low-dose losartan and titrate her blood pressure today. ->  Will need to monitor orthostatic hypotension, blood pressures currently in the 140s to 160 range.  Concern for possible aspiration event/aspiration pneumonia.  Defer to primary team.   S/p PPM: Previously functioning well.  EF has not dropped due to pacemaker.  Palliative care team on board.  Need to discuss goals of care.  The question from the family was whether or not she would benefit from going home on hospice.  Based on her frailty, and what appears to be somewhat failure to thrive, that may not be a bad option.  They asked about treatment of her angina.  I indicated that this  is what the beta-blocker and Imdur is for.  Would not have any plans for invasive evaluation and PCI.  Time spent with patient 25 minutes-family was on the telephone with her granddaughter in the room.  Multiple questions asked and answered..  Time spent in chart 25 minutes.  For questions or updates, please contact [place] Please consult [email or web address] for contact info under        Signed, Glenetta Hew, MD  12/29/2020, 8:39 AM

## 2020-12-29 NOTE — Progress Notes (Addendum)
Daily Progress Note   Patient Name: Amy Calhoun       Date: 12/29/2020 DOB: 06-22-1928  Age: 87 y.o. MRN#: [id number] Attending Physician: Enedina Finner, MD Primary Care Physician: Allegra Grana, FNP Admit Date: 12/27/2020  Reason for Consultation/Follow-up: Establishing goals of care  Subjective: Patient is resting in bed with children and 1 grandchild at bedside. 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 They have been updated and are able to articulate her status and health issues as well as risks and benefit of treatments.   Conversation with patient to discuss all of this information.   We discussed her diagnoses including cardiac issues and dysphagia, prognosis, GOC, EOL wishes disposition and options.  A detailed discussion was had today regarding advanced directives.  Concepts specific to code status, artifical feeding and hydration, IV antibiotics and rehospitalization were discussed.  The difference between an aggressive medical intervention path and a comfort care path was discussed.  Values and goals of care important to patient and family were attempted to be elicited.  During conversations she began complaining of chest pain. Staff and primary MD made aware.   Returned to conversation with patient and family.  Discussed limitations of medical interventions to prolong quality of life in some situations and discussed the concept of human mortality.  Patient states she is afraid, but trusts in [employer]. She is grateful for the support of her family. She requests their thoughts and decision making, and they defer the decision back to her. Being nurses themselves, they discussed multiple scenarios and the benefit of going home with a comfort focused care.  Discussed continuing home medications, and continuing to see her doctors if she chooses. I facilitated conversation, reviewed, clarified, and added information as needed. Patient ultimately states she would like to go home and be with her family and her dog for what time she has left. Patient would like to focus on comfort and dignity. Family is in agreement with this. They confirm DNR status.   Great detail into education of patient and discussion with family on natural trajectory of illness after discharge, and expectations at EOL were discussed.  Various questions, concerns, and scenarios addressed.      Length of Stay: 2  Current Medications: Scheduled Meds:   ascorbic acid  500 mg  Oral Daily   aspirin EC  81 mg Oral Daily   benzonatate  100 mg Oral TID   cholecalciferol  2,000 Units Oral Daily   docusate sodium  100 mg Oral BID   ezetimibe  10 mg Oral Daily   feeding supplement (NEPRO CARB STEADY)  237 mL Oral TID BM   ferrous sulfate  325 mg Oral Q breakfast   furosemide  20 mg Oral Daily   insulin aspart  0-15 Units Subcutaneous TID WC   insulin aspart  0-5 Units Subcutaneous QHS   insulin glargine-yfgn  10 Units Subcutaneous Daily   isosorbide mononitrate  30 mg Oral Daily   losartan  50 mg Oral Daily   metoprolol tartrate  25 mg Oral BID   mometasone-formoterol  2 puff Inhalation BID   multivitamin with minerals  1 tablet Oral Daily    Continuous Infusions:  cefTRIAXone (ROCEPHIN)  IV 2 g (12/29/20 1215)    PRN Meds: acetaminophen, acetaminophen-codeine, guaiFENesin, nitroGLYCERIN, ondansetron (ZOFRAN) IV  Physical Exam Pulmonary:     Effort: Pulmonary effort is normal.     Comments: Congested cough.  Neurological:     Mental Status: She is alert.            Vital Signs: BP (!) 118/52 (BP Location: Left Arm)    Pulse 84    Temp (!) 97.5 F (36.4 C)    Resp 18    Ht 5\' 4"  (1.626 m)    Wt 56.3 kg    SpO2 99%    BMI 21.30 kg/m  SpO2: SpO2: 99 % O2 Device: O2  Device: Nasal Cannula O2 Flow Rate: O2 Flow Rate (L/min): 1.5 L/min  Intake/output summary:  Intake/Output Summary (Last 24 hours) at 12/29/2020 1541 Last data filed at 12/29/2020 1300 Gross per 24 hour  Intake 1121.1 ml  Output 1200 ml  Net -78.9 ml   LBM: Last BM Date: 12/28/20 Baseline Weight: Weight: 60.8 kg Most recent weight: Weight: 56.3 kg         Patient Active Problem List   Diagnosis Date Noted   Pressure injury of skin 12/28/2020   Heart failure with preserved ejection fraction (HCC)    NSTEMI (non-ST elevated myocardial infarction) ([place]) 12/27/2020   Acute on chronic diastolic heart failure ([place]) 12/27/2020   Pacemaker 12/27/2020   Bronchitis 12/21/2020   Pressure ulcer 12/21/2020   Polypharmacy 12/07/2020   Coronary artery disease involving native coronary artery of native heart without angina pectoris 11/18/2020   Acute on chronic diastolic CHF (congestive heart failure) ([place]) 10/15/2020   Malnutrition of moderate degree 10/12/2020   Heart block AV complete (HCC)    Symptomatic bradycardia 10/10/2020   Syncope 10/10/2020   Redness of skin 06/29/2020   Recurrent falls 06/06/2020   Abnormal gait 06/06/2020   Dizziness 06/06/2020   Right wrist pain 03/18/2020   B12 deficiency 03/11/2020   Hyperglycemia due to type 2 diabetes mellitus ([place]) 11/16/2019   Type 2 diabetes mellitus with stage 3a chronic kidney disease, with long-term current use of insulin ([place]) 11/16/2019   DM type 2 with diabetic peripheral neuropathy ([place]) 11/16/2019   Depression    Weakness of both lower extremities    Fatigue 02/13/2019   Head injury 02/04/2019   Acute hypoxemic respiratory failure due to COVID-19 ([place]) 01/28/2019   Gastroesophageal reflux disease without esophagitis    Insomnia 01/05/2019   Constipation 06/02/2018   Acute pain of left shoulder 05/23/2018   Diarrhea 05/23/2018   Pneumatosis coli  07/29/2017   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   Memory disturbance 03/27/2017    Recurrent urinary tract infection 10/17/2016   Lumbar stenosis with neurogenic claudication 08/10/2016   Acute respiratory failure with hypoxia ([place]) 07/12/2016   Anemia 07/06/2016   AKI (acute kidney injury) ([place]) 04/05/2016   SOB (shortness of breath) 03/31/2016   Osteoarthritis of spine with radiculopathy, lumbar region 03/16/2016   History of colitis 03/02/2016   Primary osteoarthritis of left hip 03/02/2016   CKD (chronic kidney disease) stage 3, GFR 30-59 ml/min ([place]) 02/21/2016   DM (diabetes mellitus), type 2 with renal complications ([place]) [id number]   Hyperlipidemia associated with type 2 diabetes mellitus ([place]) 01/19/2016   Hypothyroidism 01/19/2016   Essential hypertension 01/19/2016   Chronic pain 01/19/2016   Esophageal dysmotility 12/21/2015    Palliative Care Assessment & Plan    Recommendations/Plan: Home with [facility] hospice as they would like option of hospice facility if needed.     Code Status:    Code Status Orders  (From admission, onward)           Start     Ordered   12/27/20 1627  Do not attempt resuscitation (DNR)  Continuous       Question Answer Comment  In the event of cardiac or respiratory ARREST Do not call a code blue   In the event of cardiac or respiratory ARREST Do not perform Intubation, CPR, defibrillation or ACLS   In the event of cardiac or respiratory ARREST Use medication by any route, position, wound care, and other measures to relive pain and suffering. May use oxygen, suction and manual treatment of airway obstruction as needed for comfort.   Comments verified by daughter [name] and pt      12/27/20 1626           Code Status History     Date Active Date Inactive Code Status Order ID Comments User Context   12/27/2020 0500 12/27/2020 1626 Full Code [id number]  Athena Masse, MD ED   10/10/2020 1909 10/17/2020 2103 Full Code [id number]  Cox, Sarah Ann, DO ED   01/28/2019 1921 01/29/2019 2227 Full Code [id number]  Fritzi Mandes, MD ED   08/10/2016 1358 08/11/2016 1754 Full Code [id number]  Ashok Pall, MD Inpatient   07/12/2016 1608 07/13/2016 1359 DNR [id number]  Vaughan Basta, MD Inpatient   07/12/2016 1523 07/12/2016 1608 Full Code [id number]  Vaughan Basta, MD ED   03/28/2016 2356 03/31/2016 1619 Full Code [id number]  Hugelmeyer, Ubaldo Glassing, DO Inpatient      Advance Directive Documentation    Flowsheet Row Most Recent Value  Type of Advance Directive Healthcare Power of Attorney  Pre-existing out of facility DNR order (yellow form or pink MOST form) --  "MOST" Form in Place? --       Prognosis:  < 3 months  Discharge Planning: Home with Hospice  Care plan was discussed with Primary MD and charge RN.   Thank you for allowing the Palliative Medicine Team to assist in the care of this patient.   Time In: 2:00 Time Out: 3:20 Total Time 80 min Prolonged Time Billed  yes       Greater than 50%  of this time was spent counseling and coordinating care related to the above assessment and plan.  Asencion Gowda, NP  Please contact Palliative Medicine Team phone at [phone] for questions and concerns.

## 2020-12-29 NOTE — Progress Notes (Signed)
[facility]  -  at [facility]   PATIENT NAME: Amy Calhoun    MR#:  [id number]  DATE OF BIRTH:  Jun 07, 1928  SUBJECTIVE:  laying comfortably. No family at bedside. Already in her nectar thick liquid. Although she doesn't like it. Cough some improved with scheduled cough medicine REVIEW OF SYSTEMS:   Review of Systems  Constitutional:  Negative for chills, fever and weight loss.  HENT:  Negative for ear discharge, ear pain and nosebleeds.   Eyes:  Negative for blurred vision, pain and discharge.  Respiratory:  Positive for cough. Negative for sputum production, wheezing and stridor.   Cardiovascular:  Positive for chest pain. Negative for palpitations, orthopnea and PND.  Gastrointestinal:  Negative for abdominal pain, diarrhea, nausea and vomiting.  Genitourinary:  Negative for frequency and urgency.  Musculoskeletal:  Negative for back pain and joint pain.  Neurological:  Positive for weakness. Negative for sensory change, speech change and focal weakness.  Psychiatric/Behavioral:  Negative for depression and hallucinations. The patient is not nervous/anxious.   Tolerating Diet:yes Tolerating PT:   DRUG ALLERGIES:   Allergies  Allergen Reactions   Pravachol [Pravastatin] Other (See Comments)    States she refused due to side effects    VITALS:  Blood pressure (!) 118/52, pulse 84, temperature (!) 97.5 F (36.4 C), temperature source Oral, resp. rate 18, height 5\' 4"  (1.626 m), weight 56.3 kg, SpO2 99 %.  PHYSICAL EXAMINATION:   Physical Exam  GENERAL:  87 y.o.-year-old patient lying in the bed with no acute distress.  HEENT: Head atraumatic, normocephalic. Oropharynx and nasopharynx clear.  LUNGS:decreased  breath sounds bilaterally, no wheezing, rales, rhonchi. No use of accessory muscles of respiration.  CARDIOVASCULAR: S1, S2 normal. No murmurs, rubs, or gallops.  ABDOMEN: Soft, nontender, nondistended. Bowel sounds present.  EXTREMITIES: No  cyanosis, clubbing  trace edema b/l.    NEUROLOGIC: nonfocal PSYCHIATRIC:  patient is alert  SKIN:  Pressure Injury 12/28/20 Coccyx Mid Stage 2 -  Partial thickness loss of dermis presenting as a shallow open injury with a red, pink wound bed without slough. stage 3, 3cm x2cm pressure ulcer to coccyx. Yellor slough with minimal dard center. No bone exp (Active)  12/28/20 0000  Location: Coccyx  Location Orientation: Mid  Staging: Stage 2 -  Partial thickness loss of dermis presenting as a shallow open injury with a red, pink wound bed without slough.  Wound Description (Comments): stage 3, 3cm x2cm pressure ulcer to coccyx. Yellor slough with minimal dard center. No bone exposed. Cleaned and foam applied  Present on Admission:         LABORATORY PANEL:  CBC Recent Labs  Lab 12/29/20 0344  WBC 12.9*  HGB 9.4*  HCT 28.7*  PLT 320     Chemistries  Recent Labs  Lab 12/28/20 0844  NA 136  K 3.8  CL 97*  CO2 29  GLUCOSE 186*  BUN 40*  CREATININE 1.00  CALCIUM 8.6*    Cardiac Enzymes No results for input(s): TROPONINI in the last 168 hours. RADIOLOGY:  ECHOCARDIOGRAM COMPLETE  Result Date: 12/28/2020    ECHOCARDIOGRAM REPORT   Patient Name:   Amy Calhoun Date of Exam: 12/27/2020 Medical Rec #:  12/29/2020         Height:       64.0 in Accession #:    [id number]        Weight:       134.0 lb Date of Birth:  10/27/1928  BSA:          1.650 m Patient Age:    87 years          BP:           158/60 mmHg Patient Gender: F                 HR:           89 bpm. Exam Location:  [facility] Procedure: 2D Echo, Cardiac Doppler and Color Doppler Indications:     I50.31 Acute Diastolic Heart Failure  History:         Patient has prior history of Echocardiogram examinations, most                  recent 10/11/2020. CAD; Risk Factors:Hypertension, Dyslipidemia                  and Diabetes. Hypothyroidism.  Sonographer:     Daphine Deutscher RDCS Referring Phys:  [id number] Andris Baumann Diagnosing Phys: Debbe Odea MD IMPRESSIONS  1. Left ventricular ejection fraction, by estimation, is 55 to 60%. The left ventricle has normal function. The left ventricle has no regional wall motion abnormalities. There is mild left ventricular hypertrophy. Left ventricular diastolic parameters are consistent with Grade II diastolic dysfunction (pseudonormalization).  2. Right ventricular systolic function is normal. The right ventricular size is normal.  3. The mitral valve is degenerative. Moderate mitral valve regurgitation. Moderate mitral annular calcification.  4. The aortic valve is tricuspid. Aortic valve regurgitation is mild to moderate. Aortic valve sclerosis/calcification is present, without any evidence of aortic stenosis. FINDINGS  Left Ventricle: Left ventricular ejection fraction, by estimation, is 55 to 60%. The left ventricle has normal function. The left ventricle has no regional wall motion abnormalities. The left ventricular internal cavity size was normal in size. There is  mild left ventricular hypertrophy. Left ventricular diastolic parameters are consistent with Grade II diastolic dysfunction (pseudonormalization). Right Ventricle: The right ventricular size is normal. No increase in right ventricular wall thickness. Right ventricular systolic function is normal. Left Atrium: Left atrial size was normal in size. Right Atrium: Right atrial size was normal in size. Pericardium: There is no evidence of pericardial effusion. Mitral Valve: The mitral valve is degenerative in appearance. Moderate mitral annular calcification. Moderate mitral valve regurgitation. Tricuspid Valve: The tricuspid valve is normal in structure. Tricuspid valve regurgitation is mild. Aortic Valve: The aortic valve is tricuspid. Aortic valve regurgitation is mild to moderate. Aortic regurgitation PHT measures 231 msec. Aortic valve sclerosis/calcification is present, without any evidence of aortic stenosis.  Aortic valve mean gradient measures 6.0 mmHg. Aortic valve peak gradient measures 11.8 mmHg. Aortic valve area, by VTI measures 1.02 cm. Pulmonic Valve: The pulmonic valve was not well visualized. Pulmonic valve regurgitation is not visualized. Aorta: The aortic root and ascending aorta are structurally normal, with no evidence of dilitation. Venous: The inferior vena cava was not well visualized. IAS/Shunts: No atrial level shunt detected by color flow Doppler.  LEFT VENTRICLE PLAX 2D LVIDd:         4.70 cm   Diastology LVIDs:         3.30 cm   LV e' medial:    5.55 cm/s LV PW:         1.10 cm   LV E/e' medial:  26.3 LV IVS:        1.10 cm   LV e' lateral:   5.77 cm/s LVOT diam:  1.70 cm   LV E/e' lateral: 25.3 LV SV:         30 LV SV Index:   18 LVOT Area:     2.27 cm  RIGHT VENTRICLE RV Basal diam:  3.40 cm RV S prime:     11.70 cm/s TAPSE (M-mode): 1.2 cm LEFT ATRIUM             Index        RIGHT ATRIUM          Index LA diam:        3.60 cm 2.18 cm/m   RA Area:     6.24 cm LA Vol (A2C):   27.7 ml 16.79 ml/m  RA Volume:   10.60 ml 6.42 ml/m LA Vol (A4C):   16.8 ml 10.18 ml/m LA Biplane Vol: 23.0 ml 13.94 ml/m  AORTIC VALVE AV Area (Vmax):    1.00 cm AV Area (Vmean):   1.11 cm AV Area (VTI):     1.02 cm AV Vmax:           172.00 cm/s AV Vmean:          112.000 cm/s AV VTI:            0.297 m AV Peak Grad:      11.8 mmHg AV Mean Grad:      6.0 mmHg LVOT Vmax:         75.45 cm/s LVOT Vmean:        54.900 cm/s LVOT VTI:          0.133 m LVOT/AV VTI ratio: 0.45 AI PHT:            231 msec  AORTA Ao Root diam: 3.10 cm Ao Asc diam:  3.40 cm MITRAL VALVE                TRICUSPID VALVE MV Area (PHT): 4.60 cm     TR Peak grad:   39.9 mmHg MV Decel Time: 165 msec     TR Vmax:        316.00 cm/s MV E velocity: 146.00 cm/s MV A velocity: 162.00 cm/s  SHUNTS MV E/A ratio:  0.90         Systemic VTI:  0.13 m                             Systemic Diam: 1.70 cm Kate Sable MD Electronically signed by Kate Sable MD Signature Date/Time: 12/28/2020/3:13:23 PM    Final    ASSESSMENT AND PLAN:   Ivelisse Matrice Herro is a 87 y.o. 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  Patient reports for the past couple weeks she had a cough but without fever or chills and then developed chest pain in the past few days.  Shortness of breath and acute respiratory failure with hypoxia -- negative for flu and COVID -- chest x-ray repeat shows worsening infiltrate left lung pneumonia versus pulmonary edema. -- Given elevated white count and cough with history of bronchitis will treat for pneumonia --Procalcitonin <0.01 -prn cough meds  NSTEMI (non-ST elevated myocardial infarction) (HCC) -Heparin infusion for 48 hours--now d/ced - Aspirin, metoprolol, atorvastatin - Cardiology consult with [facility] appreciated--cont conservative mnx    Acute on chronic  diastolic CHF (congestive heart failure) (HCC) - IV Lasix--changed to po lasix by Cards from 12/15 - Metoprolol - Daily weights with intake and output monitoring     AKI (acute kidney injury) ([place]) - Monitor renal function and avoid nephrotoxins -creat back to baseline 1.0     Hyperglycemia due to type 2 diabetes mellitus (HCC) - Sliding scale insulin coverage    s/p  Pacemaker recently for high grade AV block  generalized weakness with deconditioning -- PT to see patient. -- TOC for discharge planning  palliative care consultation. Patient has multiple comorbidities. She is a DNR. Discussed with daughter at bedside she wishes to take patient home with outpatient palliative/hospice palliative care met with patient and family members. Patient will discharged to home with hospice care. Family wants to take her on Friday.  Procedures: Family communication : none  today Consults : [facility] cardiology CODE STATUS: DNR DVT Prophylaxis :scd/ Level of care: Progressive Status is: Inpatient  Remains inpatient appropriate because: chf,pna         TOTAL TIME TAKING CARE OF THIS PATIENT: 30 minutes.  >50% time spent on counselling and coordination of care  Note: This dictation was prepared with Dragon dictation along with smaller phrase technology. Any transcriptional errors that result from this process are unintentional.  Fritzi Mandes M.D    [facility]   CC: Primary care physician; Burnard Hawthorne, FNP Patient ID: Amy Calhoun, female   DOB: 10-24-1928, 87 y.o.   MRN: [id number]

## 2020-12-29 NOTE — Progress Notes (Signed)
ANTICOAGULATION CONSULT NOTE  Pharmacy Consult for heparin infusion Indication: ACS/STEMI  Patient Measurements: Heparin Dosing Weight: 60.8 kg  Labs: Recent Labs    12/27/20 0014 12/27/20 0242 12/27/20 0358 12/27/20 0703 12/27/20 1219 12/28/20 0844 12/28/20 1128 12/28/20 1757 12/29/20 0344  HGB 9.6*  --   --  10.1*  --  10.8*  --   --  9.4*  HCT 29.7*  --   --  31.4*  --  32.9*  --   --  28.7*  PLT 308  --   --  339  --  350  --   --  320  APTT  --   --  33  --   --   --   --   --   --   LABPROT  --   --  13.2  --   --   --   --   --   --   INR  --   --  1.0  --   --   --   --   --   --   HEPARINUNFRC  --   --   --   --    < > 0.30  --  0.33 0.22*  CREATININE 1.31*  --   --  1.22*  --  1.00  --   --   --   TROPONINIHS 124* 803*  --   --   --  622* 574*  --   --    < > = values in this interval not displayed.     Estimated Creatinine Clearance: 32.3 mL/min (by C-G formula based on SCr of 1 mg/dL).   Medical History: Past Medical History:  Diagnosis Date   Allergy    Ambulates with cane    Anemia    At high risk for falls    Cataract    removed both eyes   Chronic kidney disease 08/07/2016   Chronic renal failure, stage III   Depression    Esophageal dysphagia    Gastric ulcer    GERD (gastroesophageal reflux disease)    Hyperlipidemia    Hypertension    Hypothyroidism    IDDM (insulin dependent diabetes mellitus)    Ischemic colitis (HCC)    Neuromuscular disorder (HCC)    neuropathy   Osteoarthritis    Ulcer of esophagus    Assessment: Pt is 87 yo female w/ recent pacemaker (~ 6 wks) presenting to ED c/o CP x 4 days, found with elevated Troponin I, trending up.  Goal of Therapy:  Heparin level 0.3-0.7 units/ml Monitor platelets by anticoagulation protocol: Yes  12/13 1219 HL 0.25 12/13 2257 HL 0.16 12/14 0844 HL 0.3  12/14 1757 HL 0.33 12/15 0344 HL 0.22, subtherapeutic   Plan:  Heparin level subtherapeutic. Plan for heparin for 48  hours. Heparin drip stopped at 0414 Pharmacy to follow up regarding restart / DVT prophylaxis  Otelia Sergeant, PharmD, [facility] 12/29/2020 6:47 AM

## 2020-12-29 NOTE — Progress Notes (Addendum)
[facility]) Hospital Liaison Note  Received request from Transitions of Care Manager [place], LCSW, for hospice services at home after discharge. Chart and patient information under review by [facility] physician. Hospice eligibility pending at this time.  Visited patient at bedside and spoke with daughter [name] to initiate education related to hospice philosophy, services and team approach to care. Patient/family verbalized understanding of information provided. Per discussion, the plan for discharge is on 12.16.22 via private vehicle once DME is delivered.  DME needs discussed. Patient has the following equipment in the home: rollator and cane. Patient/family requests the following equipment for delivery: hospital bed, OBT, oxygen, BSC. Address has been verified and is correct in the chart. is the family contact to arrange time of equipment delivery.  Please send signed and completed DNR home with patient/family. Please provide prescriptions at discharge as needed to ensure ongoing symptom management.  [facility] information and contact numbers given to family. Above information shared with [facility] Manager.  Please do not hesitate to call with any hospice related questions or concerns.  Thank you for the opportunity to participate in this patients care.  Celene Squibb, RN, BSN  [facility] Liaison  [phone]

## 2020-12-29 NOTE — Progress Notes (Signed)
Speech Language Pathology Treatment:    Patient Details Name: Amy Calhoun MRN: [id number] DOB: [birth year] Today's Date: 12/29/2020 Time: [id number] SLP Time Calculation (min) (ACUTE ONLY): 20 min  Assessment / Plan / Recommendation Clinical Impression  Pt seen for trials of thin liquids. Pt alert, pleasant, and cooperative. Confusion noted. Pt repositioned to upright position in bed. Appeared comfortable on 1L/min O2 via [place]. Granddaughter present. Granddaughter stated family meeting with Palliative Care planned for today. Granddaughter endorses pt doing "OK" with current diet, but pt does not enjoy pre-thickened beverages.   Pt given trials of thin liquids via cup sip. Pt able to self feed. Pt continues to present with s/sx concerning for highly suspected pharyngeal dysphagia. Pt with immediate cough on 1/6 single cup sips and delayed cough on 1/6 single cup sips. Clinical presentation appears consistent with initial evaluation.   Discussed current SLP recommendations, diet recommendations, safe swallowing strategies/aspiration precautions, and SLP POC with pt, pt's granddaughter, and Therapist, sports. Pt and granddaughter verbalized understanding/agreement with education provided. Suspect need for reinforcement of content by medical team as pt with cognitive deficits.   Recommend continuation of mech soft diet with nectar-thick liquids and safe swallowing strategies/aspiration precautions as outlined below.  SLP to f/u next date following Palliative Care discussion to determine next steps in SLP POC (e.g. pursuing MBS Study vs continued clinical management).     HPI HPI: Pt is a 87 y.o. 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  Patient reports for the past couple weeks she had a cough but without fever or chills  and then developed chest pain in the past few days.  The pain is of moderate intensity nonradiating and is constant.  No aggravating or alleviating factors except for exertion.  Denies nausea, vomiting, diaphoresis and palpitations.  Pt admitted for NSTEMI w/ Acute on chronic diastolic CHF and AKI.  Previous HEAD CT 10/2020: "Global  parenchymal volume loss. Periventricular white matter hypodensities  consistent with sequela of chronic microvascular ischemic disease.".  CXR: Markedly increased interstitial and airspace disease, left greater  than right, favored to be severe pulmonary edema.      SLP Plan  Continue with current plan of care      Recommendations for follow up therapy are one component of a multi-disciplinary discharge planning process, led by the attending physician.  Recommendations may be updated based on patient status, additional functional criteria and insurance authorization.    Recommendations  Diet recommendations: Dysphagia 3 (mechanical soft);Nectar-thick liquid Liquids provided via: Cup;No straw Medication Administration: Whole meds with puree (vs need to crush) Supervision: Patient able to self feed;Staff to assist with self feeding;Intermittent supervision to cue for compensatory strategies Compensations: Minimize environmental distractions;Slow rate;Small sips/bites;Lingual sweep for clearance of pocketing;Follow solids with liquid Postural Changes and/or Swallow Maneuvers: Out of bed for meals;Seated upright 90 degrees;Upright 30-60 min after meal                General recommendations:  (Dietician f/u; Palliative Consult pending) Oral Care Recommendations: Oral care BID;Oral care before and after PO;Staff/trained caregiver to provide oral care Follow Up Recommendations: Skilled nursing-short term rehab (<3 hours/day) Assistance recommended at discharge: Intermittent Supervision/Assistance SLP Visit Diagnosis: Dysphagia, oropharyngeal phase (R13.12) Plan:  Continue with current plan of care  Clyde Canterbury, M.S., CCC-SLP Speech-Language Pathologist [facility] [phone] (ASCOM)  Woodroe Chen  12/29/2020, 8:45 AM

## 2020-12-29 NOTE — Telephone Encounter (Signed)
Pts daughter called in regards to pt. Pt was hospitalized 12/13 for chest pain. Pt is supposed to be discharged into hospice. The decision regarding hospice will be decided today. Daughter is needing FMLA paperwork filled out and will be dropping it off with the front office.

## 2020-12-30 LAB — GLUCOSE, CAPILLARY
Glucose-Capillary: 313 mg/dL — ABNORMAL HIGH (ref 70–99)
Glucose-Capillary: 245 mg/dL — ABNORMAL HIGH (ref 70–99)

## 2020-12-30 MED ORDER — GUAIFENESIN 100 MG/5ML PO LIQD: 10.0000 mL | ORAL | 0 refills | Status: DC | PRN

## 2020-12-30 MED ORDER — CEFDINIR 300 MG PO CAPS
300.0000 mg | ORAL_CAPSULE | Freq: Two times a day (BID) | ORAL | Status: DC
  Administered 2020-12-30: 300 mg via ORAL
  Filled 2020-12-30 (×2): qty 1

## 2020-12-30 MED ORDER — BENZONATATE 100 MG PO CAPS: 100.0000 mg | ORAL_CAPSULE | Freq: Three times a day (TID) | ORAL | 0 refills | Status: DC

## 2020-12-30 MED ORDER — LOSARTAN POTASSIUM 50 MG PO TABS: 50.0000 mg | ORAL_TABLET | Freq: Every day | ORAL | 0 refills | Status: DC

## 2020-12-30 MED ORDER — CEFDINIR 300 MG PO CAPS: 300.0000 mg | ORAL_CAPSULE | Freq: Two times a day (BID) | ORAL | 0 refills | Status: AC

## 2020-12-30 MED ORDER — NITROGLYCERIN 0.4 MG SL SUBL: 0.4000 mg | SUBLINGUAL_TABLET | SUBLINGUAL | 12 refills | Status: DC | PRN

## 2020-12-30 MED ORDER — ISOSORBIDE MONONITRATE ER 30 MG PO TB24: 30.0000 mg | ORAL_TABLET | Freq: Every day | ORAL | 0 refills | Status: DC

## 2020-12-30 MED ORDER — ACETAMINOPHEN-CODEINE 300-30 MG PO TABS: 1.0000 | ORAL_TABLET | Freq: Two times a day (BID) | ORAL | 0 refills | Status: DC | PRN

## 2020-12-30 MED ORDER — ASPIRIN 81 MG PO TBEC: 81.0000 mg | DELAYED_RELEASE_TABLET | Freq: Every day | ORAL | 11 refills | Status: DC

## 2020-12-30 MED ORDER — FUROSEMIDE 20 MG PO TABS: 20.0000 mg | ORAL_TABLET | Freq: Every day | ORAL | 1 refills | Status: DC

## 2020-12-30 NOTE — Progress Notes (Signed)
Progress Note  Patient Name: Amy Calhoun Date of Encounter: 12/30/2020  Primary Cardiologist: End  Subjective   Continues to note intermittent chest pain. Dyspnea about the same. Planning for discharge with home hospice.   Inpatient Medications    Scheduled Meds:  ascorbic acid  500 mg Oral Daily   aspirin EC  81 mg Oral Daily   benzonatate  100 mg Oral TID   cholecalciferol  2,000 Units Oral Daily   docusate sodium  100 mg Oral BID   ezetimibe  10 mg Oral Daily   feeding supplement (NEPRO CARB STEADY)  237 mL Oral TID BM   ferrous sulfate  325 mg Oral Q breakfast   furosemide  20 mg Oral Daily   insulin aspart  0-15 Units Subcutaneous TID WC   insulin aspart  0-5 Units Subcutaneous QHS   insulin glargine-yfgn  10 Units Subcutaneous Daily   isosorbide mononitrate  30 mg Oral Daily   losartan  50 mg Oral Daily   metoprolol tartrate  25 mg Oral BID   mometasone-formoterol  2 puff Inhalation BID   multivitamin with minerals  1 tablet Oral Daily   Continuous Infusions:  cefTRIAXone (ROCEPHIN)  IV 2 g (12/29/20 1215)   PRN Meds: acetaminophen, acetaminophen-codeine, guaiFENesin, nitroGLYCERIN, ondansetron (ZOFRAN) IV   Vital Signs    Vitals:   12/29/20 1945 12/30/20 0006 12/30/20 0430 12/30/20 0716  BP: (!) 155/64 (!) 158/85 (!) 169/66 (!) 171/66  Pulse: 83 73 78 87  Resp: 18 17 17 17   Temp: 98.3 F (36.8 C) 98.1 F (36.7 C) 98.4 F (36.9 C) 98.1 F (36.7 C)  TempSrc:  Oral Oral   SpO2: 100% 100% 98% 99%  Weight:      Height:        Intake/Output Summary (Last 24 hours) at 12/30/2020 0731 Last data filed at 12/29/2020 1700 Gross per 24 hour  Intake 240 ml  Output 300 ml  Net -60 ml   Filed Weights   12/27/20 0004 12/28/20 0505  Weight: 60.8 kg 56.3 kg    Telemetry    V-paced with intermittent PVCs - Personally Reviewed  ECG    No new tracings - Personally Reviewed  Physical Exam   GEN: No acute distress. Elderly and frail appearing.   Neck: No JVD. Cardiac: RRR, I/VI systolic murmur RUSB, no rubs, or gallops. Chest pain is reproducible to palpation on exam. Respiratory: Coarse rhonchorous breath sounds bilaterally.  GI: Soft, nontender, non-distended.   MS: No edema; No deformity. Neuro:  Alert and oriented x 3; Nonfocal.  Psych: Normal affect.  Labs    Chemistry Recent Labs  Lab 12/27/20 0014 12/27/20 0703 12/28/20 0844  NA 131* 132* 136  K 4.9 4.7 3.8  CL 100 98 97*  CO2 22 25 29   GLUCOSE 424* 417* 186*  BUN 45* 46* 40*  CREATININE 1.31* 1.22* 1.00  CALCIUM 8.5* 8.7* 8.6*  GFRNONAA 39* 42* 54*  ANIONGAP 9 9 10      Hematology Recent Labs  Lab 12/27/20 0703 12/28/20 0844 12/29/20 0344  WBC 11.2* 16.4* 12.9*  RBC 3.44* 3.68* 3.25*  HGB 10.1* 10.8* 9.4*  HCT 31.4* 32.9* 28.7*  MCV 91.3 89.4 88.3  MCH 29.4 29.3 28.9  MCHC 32.2 32.8 32.8  RDW 13.4 13.3 13.2  PLT 339 350 320    Cardiac EnzymesNo results for input(s): TROPONINI in the last 168 hours. No results for input(s): TROPIPOC in the last 168 hours.   BNP Recent  Labs  Lab 12/27/20 0014  BNP 1,034.4*     DDimer No results for input(s): DDIMER in the last 168 hours.   Radiology    No results found.  Cardiac Studies   2D echo 12/27/2020: 1. Left ventricular ejection fraction, by estimation, is 55 to 60%. The  left ventricle has normal function. The left ventricle has no regional  wall motion abnormalities. There is mild left ventricular hypertrophy.  Left ventricular diastolic parameters  are consistent with Grade II diastolic dysfunction (pseudonormalization).   2. Right ventricular systolic function is normal. The right ventricular  size is normal.   3. The mitral valve is degenerative. Moderate mitral valve regurgitation.  Moderate mitral annular calcification.   4. The aortic valve is tricuspid. Aortic valve regurgitation is mild to  moderate. Aortic valve sclerosis/calcification is present, without any  evidence of  aortic stenosis. __________  [facility] 09/2020: Conclusions: Severe single-vessel coronary artery disease with diffuse disease involving relatively small RCA extending from the ostium through the mid/distal vessel of up to 90%.  Mild-moderate disease is also noted in the left coronary artery. Mildly elevated left ventricular filling pressure (LVEDP 15-20 mmHg). Successful placement of 27F temporary transvenous pacing wire via the right internal jugular vein.   Recommendations: Recommend medical therapy of relatively small and diffusely diseased RCA, which already fills via faint left-right collaterals to the PDA. Secondary prevention of coronary artery disease. STAT portable chest radiograph when patient arrives in ICU for evaluation of temporary transvenous pacemaker placement. EP consultation with plans for permanent pacemaker tomorrow morning with Dr. Quentin Ore.  Patient Profile     87 y.o. female with history of CAD with diffuse RCA disease medically managed by [facility] in 09/2020, CHM s/p PPM, HFpEF, HTN, HLD, Covid in 01/2018 with 30 pounds unintentional weight loss throughout the Covid pandemic, DM2 with peripheral neuropathy, anemia, orthostatic hypotension, hypothyroidism , and GERD who we are seeing for NSTEMI, medically managed.   Assessment & Plan    1. CAD with NSTEMI: -Continues to note intermittent chest pain, reproducible to palpation -Cannot exclude some degree of demand ischemia in the context of acute on chronic HFpEF -Troponin peaked at 803 -Medical management recommended given advanced age and comorbid conditions and with patient/family preference for hospice home at discharge -Has completed IV heparin -No plans for invasive ischemic evaluation -Imdur -Lopressor -ASA -Could use short term Plavix, though with plans to discharge to hospice home, this may not be indicated  -Zetia, given advanced age and goals of care, no statin is appropriate   2. Suspected acute on chronic  HFpEF: -Appears euvolemic and well compensated -Will need some degree of permissive hypertension given orthostatic hypotension -Has been transitioned from IV Lasix to oral  3. CHB: -Status post PPM  Dispo: -Discharging to home hospice       For questions or updates, please contact [place] Please consult [email or web address] for contact info under Cardiology/STEMI.    Signed, Christell Faith, PA-C [place] Pager: [phone] 12/30/2020, 7:31 AM

## 2020-12-30 NOTE — Discharge Summary (Addendum)
[place] [facility] NAME: Roger Fasnacht    MR#:  [id number]  DATE OF BIRTH:  01-Aug-1928  DATE OF ADMISSION:  12/27/2020 ADMITTING PHYSICIAN: Athena Masse, MD  DATE OF DISCHARGE: 12/30/2020  PRIMARY CARE PHYSICIAN: Burnard Hawthorne, FNP    ADMISSION DIAGNOSIS:  SOB (shortness of breath) [R06.02] NSTEMI (non-ST elevated myocardial infarction) ([place]) [I21.4]  DISCHARGE DIAGNOSIS:  NSTEMI--medical mnx shortness of breath with acute hypoxic respiratory failure due to congestive heart failure acute on chronic pneumonia  SECONDARY DIAGNOSIS:   Past Medical History:  Diagnosis Date   Allergy    Ambulates with cane    Anemia    At high risk for falls    Cataract    removed both eyes   Chronic kidney disease 08/07/2016   Chronic renal failure, stage III   Depression    Esophageal dysphagia    Gastric ulcer    GERD (gastroesophageal reflux disease)    Hyperlipidemia    Hypertension    Hypothyroidism    IDDM (insulin dependent diabetes mellitus)    Ischemic colitis ([place])    Neuromuscular disorder ([place])    neuropathy   Osteoarthritis    Ulcer of esophagus     HOSPITAL COURSE:   Rechel Arina Torry is a 87 y.o. 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  Patient reports for the past couple weeks she had a cough but without fever or chills and then developed chest pain in the past few days.   Shortness of breath and acute respiratory failure with hypoxia -- negative for flu and COVID --sats were in the upper 80's -- chest x-ray repeat shows worsening infiltrate left lung pneumonia versus pulmonary edema. -- Given elevated white count and cough with history of bronchitis will treat for pneumonia --Procalcitonin <0.01 -prn  cough meds --change to po abxs (total 7 days)   NSTEMI (non-ST elevated myocardial infarction) (HCC) -Heparin infusion for 48 hours--now d/ced - Aspirin, metoprolol, atorvastatin - Cardiology consult with [facility] appreciated--cont conservative mnx --cont imdur,asa,prn nitro, losartan and bb    Acute on chronic diastolic CHF (congestive heart failure) ([place]) - IV Lasix--changed to po lasix by Cards from 12/15 - Metoprolol - Daily weights with intake and output monitoring     AKI (acute kidney injury) ([place]) - Monitor renal function and avoid nephrotoxins -creat back to baseline 1.0   Hyperglycemia due to type 2 diabetes mellitus (HCC) - Sliding scale insulin coverage    s/p Pacemaker recently for high grade AV block   generalized weakness with deconditioning -- PT to see patient. -- TOC for discharge planning   palliative care consultation. Patient has multiple comorbidities. She is a DNR. Discussed with daughter at bedside she wishes to take patient home with outpatient palliative/hospice palliative care met with patient and family members. Patient will discharged to home with hospice care.  Discussed with patient's daughter [name] discharge plan today. She is in agreement.   Procedures:none Family communication : dter emma Consults : [facility] cardiology CODE STATUS: DNR DVT Prophylaxis :scd/ Level of care: Progressive Status is: Inpatient      CONSULTS OBTAINED:  Treatment Team:  Nelva Bush, MD  DRUG ALLERGIES:   Allergies  Allergen Reactions   Pravachol [Pravastatin] Other (See Comments)  States she refused due to side effects    DISCHARGE MEDICATIONS:   Allergies as of 12/30/2020       Reactions   Pravachol [pravastatin] Other (See Comments)   States she refused due to side effects        Medication List     STOP taking these medications    doxycycline 100 MG capsule Commonly known as: VIBRAMYCIN   fluticasone-salmeterol 100-50 MCG/ACT  Aepb Commonly known as: ADVAIR   lisinopril 2.5 MG tablet Commonly known as: ZESTRIL   pyridOXINE 100 MG tablet Commonly known as: VITAMIN B-6       TAKE these medications    Acetaminophen-Codeine 300-30 MG tablet Take 1 tablet by mouth every 12 (twelve) hours as needed for pain (moderate to severe pain).   albuterol (2.5 MG/3ML) 0.083% nebulizer solution Commonly known as: PROVENTIL Take 3 mLs (2.5 mg total) by nebulization every 6 (six) hours as needed for wheezing or shortness of breath.   albuterol 108 (90 Base) MCG/ACT inhaler Commonly known as: VENTOLIN HFA Inhale 2 puffs into the lungs every 6 (six) hours as needed for wheezing or shortness of breath.   amitriptyline 100 MG tablet Commonly known as: ELAVIL Take 1 tablet (100 mg total) by mouth at bedtime.   ascorbic acid 500 MG tablet Commonly known as: VITAMIN C Take 500 mg by mouth daily.   aspirin 81 MG EC tablet Take 1 tablet (81 mg total) by mouth daily. Swallow whole. Start taking on: December 31, 2020   Bariatric Rollator Misc Use as needed   benzonatate 100 MG capsule Commonly known as: TESSALON Take 1 capsule (100 mg total) by mouth 3 (three) times daily.   budesonide-formoterol 80-4.5 MCG/ACT inhaler Commonly known as: SYMBICORT Inhale 2 puffs into the lungs 2 (two) times daily.   cefdinir 300 MG capsule Commonly known as: OMNICEF Take 1 capsule (300 mg total) by mouth every 12 (twelve) hours for 4 days.   docusate sodium 100 MG capsule Commonly known as: COLACE Take 1 capsule (100 mg total) by mouth 2 (two) times daily.   DODEX IJ Inject as directed every 30 (thirty) days.   ezetimibe 10 MG tablet Commonly known as: ZETIA Take 1 tablet (10 mg total) by mouth daily.   ferrous sulfate 324 MG Tbec Take 324 mg by mouth daily with breakfast.   FreeStyle Libre 14 Day Sensor Misc 1 Package by Does not apply route every 14 (fourteen) days.   furosemide 20 MG tablet Commonly known as:  LASIX Take 1 tablet (20 mg total) by mouth daily. What changed: how much to take   gabapentin 100 MG capsule Commonly known as: NEURONTIN Take 2 capsules (200 mg total) by mouth 3 (three) times daily. What changed: how much to take   glucagon 1 MG injection Inject 1 mg into the muscle once as needed for up to 1 dose. Hypoglycemia If she is not tolerating PO.   guaiFENesin 100 MG/5ML liquid Commonly known as: ROBITUSSIN Take 10 mLs by mouth every 4 (four) hours as needed for cough or to loosen phlegm.   insulin lispro 100 UNIT/ML KwikPen Commonly known as: HumaLOG KwikPen Inject 8 Units into the skin 3 (three) times daily. Max Daily 30 units   Insulin Pen Needle 32G X 4 MM Misc 1 Device by Does not apply route in the morning, at noon, in the evening, and at bedtime.   isosorbide mononitrate 30 MG 24 hr tablet Commonly known as: IMDUR Take 1 tablet (30  mg total) by mouth daily. Start taking on: December 31, 2020   LANTUS SOLOSTAR [place] Inject 14 Units into the skin at bedtime.   levothyroxine 150 MCG tablet Commonly known as: SYNTHROID TAKE 1 TABLET DAILY What changed: when to take this   losartan 50 MG tablet Commonly known as: COZAAR Take 1 tablet (50 mg total) by mouth daily. Start taking on: December 31, 2020   metoprolol tartrate 25 MG tablet Commonly known as: LOPRESSOR Take 25 mg by mouth. 1/2 tablet in AM and 1 tablet in the evening   nitroGLYCERIN 0.4 MG SL tablet Commonly known as: NITROSTAT Place 1 tablet (0.4 mg total) under the tongue every 5 (five) minutes x 3 doses as needed for chest pain.   omeprazole 40 MG capsule Commonly known as: PRILOSEC TAKE 1 CAPSULE TWICE A DAY   predniSONE 50 MG tablet Commonly known as: DELTASONE Take 1 tablet (50 mg total) by mouth daily for 5 days.   PRESERVISION AREDS PO Take 1 tablet by mouth daily.   Vitamin D 50 MCG (2000 UT) tablet Take 2,000 Units by mouth daily.               Discharge Care  Instructions  (From admission, onward)           Start     Ordered   12/30/20 0000  Discharge wound care:       Comments: Foam padding   12/30/20 1102            If you experience worsening of your admission symptoms, develop shortness of breath, life threatening emergency, suicidal or homicidal thoughts you must seek medical attention immediately by calling 911 or calling your MD immediately  if symptoms less severe.  You Must read complete instructions/literature along with all the possible adverse reactions/side effects for all the Medicines you take and that have been prescribed to you. Take any new Medicines after you have completely understood and accept all the possible adverse reactions/side effects.   Please note  You were cared for by a hospitalist during your hospital stay. If you have any questions about your discharge medications or the care you received while you were in the hospital after you are discharged, you can call the unit and asked to speak with the hospitalist on call if the hospitalist that took care of you is not available. Once you are discharged, your primary care physician will handle any further medical issues. Please note that NO REFILLS for any discharge medications will be authorized once you are discharged, as it is imperative that you return to your primary care physician (or establish a relationship with a primary care physician if you do not have one) for your aftercare needs so that they can reassess your need for medications and monitor your lab values. Today   SUBJECTIVE  resting comfortably. No family at bedside. Denies any other complaints.   VITAL SIGNS:  Blood pressure (!) 171/66, pulse 87, temperature 98.1 F (36.7 C), resp. rate 17, height _0  (1.626 m), weight 56.3 kg, SpO2 99 %.  I/O:   Intake/Output Summary (Last 24 hours) at 12/30/2020 1325 Last data filed at 12/30/2020 1202 Gross per 24 hour  Intake 100 ml  Output 750 ml   Net -650 ml    PHYSICAL EXAMINATION:  GENERAL:  87 y.o.-year-old patient lying in the bed with no acute distress.  HEENT: Head atraumatic, normocephalic. LUNGS:decreased  breath sounds bilaterally, no wheezing, rales, rhonchi. No use of accessory  muscles of respiration.  CARDIOVASCULAR: S1, S2 normal. No murmurs, rubs, or gallops.  ABDOMEN: Soft, nontender, nondistended. Bowel sounds present.  EXTREMITIES: No cyanosis, clubbing  trace edema b/l.    NEUROLOGIC: nonfocal PSYCHIATRIC:  patient is alert  SKIN:  Pressure Injury 12/28/20 Coccyx Mid Stage 2 -  Partial thickness loss of dermis presenting as a shallow open injury with a red, pink wound bed without slough. stage 3, 3cm x2cm pressure ulcer to coccyx. [place]ell[place] slough with minimal dard center. No bone exp (Active)  12/28/20 0000  Location: Coccyx  Location Orientation: Mid  Staging: Stage 2 -  Partial thickness loss of dermis presenting as a shallow open injury with a red, pink wound bed without slough.  Wound Description (Comments): stage 3, 3cm x2cm pressure ulcer to coccyx. [place]ell[place] slough with minimal dard center. No bone exposed. Cleaned and foam applied  Present on Admission:           DATA REVIEW:   CBC  Recent Labs  Lab 12/29/20 0344  WBC 12.9*  HGB 9.4*  HCT 28.7*  PLT 320    Chemistries  Recent Labs  Lab 12/28/20 0844  NA 136  K 3.8  CL 97*  CO2 29  GLUCOSE 186*  BUN 40*  CREATININE 1.00  CALCIUM 8.6*    Microbiology Results   Recent Results (from the past 240 hour(s))  Resp Panel by RT-PCR (Flu A&B, Covid) Nasopharyngeal Swab     Status: None   Collection Time: 12/27/20  3:58 AM   Specimen: Nasopharyngeal Swab; Nasopharyngeal(NP) swabs in vial transport medium  Result Value Ref Range Status   SARS Coronavirus 2 by RT PCR NEGATIVE NEGATIVE Final    Comment: (NOTE) SARS-CoV-2 target nucleic acids are NOT DETECTED.  The SARS-CoV-2 RNA is generally detectable in upper respiratory specimens  during the acute phase of infection. The lowest concentration of SARS-CoV-2 viral copies this assay can detect is 138 copies/mL. A negative result does not preclude SARS-Cov-2 infection and should not be used as the sole basis for treatment or other patient management decisions. A negative result may occur with  improper specimen collection/handling, submission of specimen other than nasopharyngeal swab, presence of viral mutation(s) within the areas targeted by this assay, and inadequate number of viral copies(<138 copies/mL). A negative result must be combined with clinical observations, patient history, and epidemiological information. The expected result is Negative.  Fact Sheet for Patients:  EntrepreneurPulse.com.au  Fact Sheet for Healthcare Providers:  IncredibleEmployment.be  This test is no t yet approved or cleared by the [place] FDA and  has been authorized for detection and/or diagnosis of SARS-CoV-2 by FDA under an Emergency Use Authorization (EUA). This EUA will remain  in effect (meaning this test can be used) for the duration of the COVID-19 declaration under Section 564(b)(1) of the Act, 21 U.S.C.section 360bbb-3(b)(1), unless the authorization is terminated  or revoked sooner.       Influenza A by PCR NEGATIVE NEGATIVE Final   Influenza B by PCR NEGATIVE NEGATIVE Final    Comment: (NOTE) The Xpert Xpress SARS-CoV-2/FLU/RSV plus assay is intended as an aid in the diagnosis of influenza from Nasopharyngeal swab specimens and should not be used as a sole basis for treatment. Nasal washings and aspirates are unacceptable for Xpert Xpress SARS-CoV-2/FLU/RSV testing.  Fact Sheet for Patients: EntrepreneurPulse.com.au  Fact Sheet for Healthcare Providers: IncredibleEmployment.be  This test is not yet approved or cleared by the [place] FDA and has been authorized for detection  and/or diagnosis of SARS-CoV-2 by FDA under an Emergency Use Authorization (EUA). This EUA will remain in effect (meaning this test can be used) for the duration of the COVID-19 declaration under Section 564(b)(1) of the Act, 21 U.S.C. section 360bbb-3(b)(1), unless the authorization is terminated or revoked.  Performed at [facility], [address], [place], [place] [address]     RADIOLOGY:  No results found.   CODE STATUS:     Code Status Orders  (From admission, onward)           Start     Ordered   12/27/20 1627  Do not attempt resuscitation (DNR)  Continuous       Question Answer Comment  In the event of cardiac or respiratory ARREST Do not call a code blue   In the event of cardiac or respiratory ARREST Do not perform Intubation, CPR, defibrillation or ACLS   In the event of cardiac or respiratory ARREST Use medication by any route, position, wound care, and other measures to relive pain and suffering. May use oxygen, suction and manual treatment of airway obstruction as needed for comfort.   Comments verified by daughter [name] and pt      12/27/20 1626           Code Status History     Date Active Date Inactive Code Status Order ID Comments User Context   12/27/2020 0500 12/27/2020 1626 Full Code [id number]  Athena Masse, MD ED   10/10/2020 1909 10/17/2020 2103 Full Code [id number]  Cox, [place], DO ED   01/28/2019 1921 01/29/2019 2227 Full Code [id number]  Fritzi Mandes, MD ED   08/10/2016 1358 08/11/2016 1754 Full Code [id number]  Ashok Pall, MD Inpatient   07/12/2016 1608 07/13/2016 1359 DNR [id number]  Vaughan Basta, MD Inpatient   07/12/2016 1523 07/12/2016 1608 Full Code [id number]  Vaughan Basta, MD ED   03/28/2016 2356 03/31/2016 1619 Full Code [id number]  Hugelmeyer, Ubaldo Glassing, DO Inpatient      Advance Directive Documentation    Flowsheet Row Most Recent Value  Type of Advance Directive Healthcare Power of Attorney  Pre-existing  out of facility DNR order (yellow form or pink MOST form) --  "MOST" Form in Place? --        TOTAL TIME TAKING CARE OF THIS PATIENT: 40 minutes.    Fritzi Mandes M.D  [facility]    CC: Primary care physician; Burnard Hawthorne, FNP

## 2020-12-30 NOTE — Telephone Encounter (Signed)
They are anxiously awaiting a a response. I did let Amy Calhoun know that you were out of office & would have a response Monday.

## 2020-12-30 NOTE — Telephone Encounter (Signed)
Amy Calhoun (referral intake Production designer, theatre/television/film) from [facility] stated that Pt was discharge from hospice today. Amy Calhoun stated that Pt family would like to know if NP Arnett would agree to service hospice attending for Pt. Amy Calhoun stated that Pt family would like an answer by today if possible. Amy Calhoun is requesting callback at [phone]

## 2020-12-30 NOTE — Care Management Important Message (Signed)
Important Message  Patient Details  Name: Amy Calhoun MRN: [id number] Date of Birth: [birth year]   Medicare Important Message Given:  Other (see comment)  Disposition to discharge with hospice services.  Medicare IM withheld at this time.    [name] 12/30/2020, 8:59 AM

## 2020-12-30 NOTE — Progress Notes (Signed)
[facility]) Hospital Liaison Note  Plan is for patient to D/C today via private vehicle per family request. DME to be delivered between 10a-12p today.  Please send signed and completed DNR with patient/family upon discharge. Please provide prescriptions at discharge as needed to ensure ongoing symptom management and a transport packet.   [facility] information and contact numbers given to family and above information shared with TOC.    Please call with any questions/concerns.    Thank you for the opportunity to participate in this patient's care   Odette Fraction, MSW [facility] Liaison  [phone]

## 2020-12-30 NOTE — Discharge Instructions (Signed)
Use your oxygen and nebulizer as before. °

## 2020-12-31 ENCOUNTER — Other Ambulatory Visit: Payer: Self-pay | Admitting: Family

## 2020-12-31 MED ORDER — INSULIN LISPRO (1 UNIT DIAL) 100 UNIT/ML (KWIKPEN): 8.0000 [IU] | PEN_INJECTOR | Freq: Three times a day (TID) | SUBCUTANEOUS | 3 refills | Status: DC

## 2020-12-31 NOTE — Telephone Encounter (Signed)
I spoke with Amy Calhoun this morning and recommended that hospice be her mother's attending.  Please call kristy and let her know  Amy Calhoun asked when medication prescriptions would transfer over and who provider would be assigned to her. Can you ask kristy?   Amy Calhoun asked for humalog refill which I have provided.

## 2021-01-02 ENCOUNTER — Other Ambulatory Visit: Payer: Self-pay

## 2021-01-02 ENCOUNTER — Telehealth: Payer: Self-pay | Admitting: Family

## 2021-01-02 ENCOUNTER — Telehealth: Payer: Self-pay

## 2021-01-02 MED ORDER — INSULIN LISPRO (1 UNIT DIAL) 100 UNIT/ML (KWIKPEN): 8.0000 [IU] | PEN_INJECTOR | Freq: Three times a day (TID) | SUBCUTANEOUS | 3 refills | Status: DC

## 2021-01-02 NOTE — Telephone Encounter (Signed)
LM for Fuller Mandril to please call back.

## 2021-01-02 NOTE — Telephone Encounter (Signed)
I called and spoke with patient's daughter & granddaughter. Granddaughter, Angelica Chessman will call [employer] & have them fax to [place].

## 2021-01-02 NOTE — Telephone Encounter (Signed)
Transition Care Management Unsuccessful Follow-up Telephone Call  Date of discharge and from where:  12/30/20-[facility]  Attempts:  1st Attempt  Reason for unsuccessful TCM follow-up call:  Unable to leave message. Will follow.

## 2021-01-02 NOTE — Telephone Encounter (Signed)
Claris Che has spoken to [place] via [employer] in regards to this. The attending physician will take care of meds while under her care.

## 2021-01-02 NOTE — Telephone Encounter (Signed)
Please call emma  I dont have FMLA form for her daughter here  Did she leave it with front desk? Can you look into this?

## 2021-01-03 ENCOUNTER — Ambulatory Visit: Payer: Medicare HMO | Admitting: Gastroenterology

## 2021-01-03 NOTE — Telephone Encounter (Signed)
Transition Care Management Unsuccessful Follow-up Telephone Call  Date of discharge and from where:  12/30/20-[facility]  Attempts:  2nd Attempt  Reason for unsuccessful TCM follow-up call:  No TCM scheduled at this time. Reports having hospice. TCM does not qualify with hospice. TCM attempts closed.

## 2021-01-11 ENCOUNTER — Ambulatory Visit (INDEPENDENT_AMBULATORY_CARE_PROVIDER_SITE_OTHER)

## 2021-01-11 ENCOUNTER — Ambulatory Visit: Payer: Medicare HMO | Admitting: *Deleted

## 2021-01-11 DIAGNOSIS — I10 Essential (primary) hypertension: Secondary | ICD-10-CM

## 2021-01-11 DIAGNOSIS — E1129 Type 2 diabetes mellitus with other diabetic kidney complication: Secondary | ICD-10-CM

## 2021-01-11 DIAGNOSIS — I442 Atrioventricular block, complete: Secondary | ICD-10-CM

## 2021-01-11 LAB — CUP PACEART REMOTE DEVICE CHECK
Battery Remaining Longevity: 71 mo
Battery Remaining Percentage: 95.5 %
Battery Voltage: 2.99 V
Brady Statistic AP VP Percent: 13 %
Brady Statistic AP VS Percent: 1 %
Brady Statistic AS VP Percent: 87 %
Brady Statistic AS VS Percent: 1 %
Brady Statistic RA Percent Paced: 13 %
Brady Statistic RV Percent Paced: 99 %
Date Time Interrogation Session: 20221228020014
Implantable Lead Implant Date: 20220928
Implantable Lead Implant Date: 20220928
Implantable Lead Location: 753859
Implantable Lead Location: 753860
Implantable Pulse Generator Implant Date: 20220928
Lead Channel Impedance Value: 440 Ohm
Lead Channel Impedance Value: 490 Ohm
Lead Channel Pacing Threshold Amplitude: 0.75 V
Lead Channel Pacing Threshold Amplitude: 1 V
Lead Channel Pacing Threshold Pulse Width: 0.4 ms
Lead Channel Pacing Threshold Pulse Width: 0.4 ms
Lead Channel Sensing Intrinsic Amplitude: 2.1 mV
Lead Channel Sensing Intrinsic Amplitude: 3.8 mV
Lead Channel Setting Pacing Amplitude: 3.5 V
Lead Channel Setting Pacing Amplitude: 3.5 V
Lead Channel Setting Pacing Pulse Width: 0.4 ms
Lead Channel Setting Sensing Sensitivity: 4 mV
Pulse Gen Model: 2272
Pulse Gen Serial Number: 3942268

## 2021-01-11 NOTE — Chronic Care Management (AMB) (Addendum)
°  Care Management   Follow Up Note   01/11/2021 Name: Amy Calhoun MRN: [id number] DOB: 1928-06-17   Referred by: Allegra Grana, FNP Reason for referral : Chronic Care Management (Case Closure)   Successful outreach to daughter.  HIPAA verified with daughter.   Confirmed with daughter, patient is now home with  home with Hospice care after recent discharge from Hospital.  Encouraged daughter to contact CCM service if future needs arise and status change.  Follow Up Plan: No further follow up required: as patient is home with Hospice services  Rhae Lerner RN, MSN RN Care Management Coordinator [facility] [phone] [email or web address]

## 2021-01-11 NOTE — Progress Notes (Signed)
No that's all. Thank you

## 2021-01-12 NOTE — Progress Notes (Deleted)
°   Patient ID: Amy Calhoun, female    DOB: 04-Apr-1928, 87 y.o.   MRN: [id number]  HPI  Ms Mamone is a 87 y/o female with a history of  Echo report from 12/27/20 reviewed and showed an EF of 55-60% along with mild LVH, moderate MR and mild/moderate AR.   LHC done 10/11/20 and showed: Severe single-vessel coronary artery disease with diffuse disease involving relatively small RCA extending from the ostium through the mid/distal vessel of up to 90%.  Mild-moderate disease is also noted in the left coronary artery. Mildly elevated left ventricular filling pressure (LVEDP 15-20 mmHg). Successful placement of 68F temporary transvenous pacing wire via the right internal jugular vein.  Admitted 12/27/20 due to chest pain and shortness of breath. Negative for flu/covid. Given antibiotics for pneumonia. Found to have NSTEMI. Cardiology and palliative care consults obtained. Initially given IV lasix with transition to oral diuretics. PT consult done. Discharged after 3 days with home hospice services.   She presents today for her initial visit with a chief complaint of   Review of Systems    Physical Exam  Assessment & Plan:  1: Chronic heart failure with preserved ejection fraction with structural changes (LVH)- - NYHA class - saw cardiology Fransico Michael) 12/19/20 - pacemaker present - BNP 12/27/20 was 1034.4  2: HTN with CKD- - BP - had video visit with PCP (Arnett) 12/21/20 - BMP 12/28/20 reviewed and showed sodium 136, potassium 3.8, creatinine 1.00 & GFR 54  3: Type 2 DM- - A1c 12/12/20 was 9.1%

## 2021-01-13 ENCOUNTER — Telehealth: Payer: Self-pay | Admitting: Family

## 2021-01-13 ENCOUNTER — Ambulatory Visit: Payer: Medicare HMO | Admitting: Family

## 2021-01-13 NOTE — Telephone Encounter (Signed)
Patient did not show for her Heart Failure Clinic appointment on 01/13/21. Will attempt to reschedule.

## 2021-01-17 NOTE — Progress Notes (Signed)
Electrophysiology Office Follow up Visit Note:    Date:  01/18/2021   ID:  Amy Calhoun, DOB 1928/02/05, MRN [id number]  PCP:  Burnard Hawthorne, [facility] Cardiologist:  Nelva Bush, MD  [facility] Electrophysiologist:  Vickie Epley, MD    Interval History:    Amy Calhoun is a 88 y.o. female who presents for a follow up visit. She had a pacemaker implanted 10/12/2020 for complete heart block. Device interrogation since implant has showed stable device function but with 25 PMT episodes.  She is with her daughter today.  She lives with her daughter now after recent discharge from the hospital.  She is also an outpatient hospice.  She gets out and about, goes out to eat, went on a trip to the mountains to ride trains recently.  No trouble with the pacemaker site.      Past Medical History:  Diagnosis Date   Allergy    Ambulates with cane    Anemia    At high risk for falls    Cataract    removed both eyes   Chronic kidney disease 08/07/2016   Chronic renal failure, stage III   Depression    Esophageal dysphagia    Gastric ulcer    GERD (gastroesophageal reflux disease)    Hyperlipidemia    Hypertension    Hypothyroidism    IDDM (insulin dependent diabetes mellitus)    Ischemic colitis ([employer])    Neuromuscular disorder ([place])    neuropathy   Osteoarthritis    Ulcer of esophagus     Past Surgical History:  Procedure Laterality Date   ABDOMINAL HYSTERECTOMY     CATARACT EXTRACTION, BILATERAL     CHOLECYSTECTOMY     COLONOSCOPY  2010   ESOPHAGEAL DILATION  08/07/2016   usually a couple times a year, last time 07/30/16   GALLBLADDER SURGERY     LEFT HEART CATH AND CORONARY ANGIOGRAPHY N/A 10/11/2020   Procedure: LEFT HEART CATH AND CORONARY ANGIOGRAPHY;  Surgeon: Nelva Bush, MD;  Location: [place] [facility];  Service: Cardiovascular;  Laterality: N/A;   LUMBAR LAMINECTOMY/DECOMPRESSION MICRODISCECTOMY N/A 08/10/2016   Procedure:  BILATERAL HEMILAMINECTOMY LUMBAR THREE-FOUR,LUMBAR FOUR-FIVE,LEFT LUMBAR FIVE-SACRAL ONE HEMILAMINECTOMY AND DECOMPRESSION;  Surgeon: Ashok Pall, MD;  Location: [place];  Service: Neurosurgery;  Laterality: N/A;   PACEMAKER IMPLANT N/A 10/12/2020   Procedure: PACEMAKER IMPLANT;  Surgeon: Vickie Epley, MD;  Location: [place] [facility];  Service: Cardiovascular;  Laterality: N/A;   TEMPORARY PACEMAKER N/A 10/11/2020   Procedure: TEMPORARY PACEMAKER;  Surgeon: Nelva Bush, MD;  Location: [place] [facility];  Service: Cardiovascular;  Laterality: N/A;   THYROIDECTOMY     UPPER GASTROINTESTINAL ENDOSCOPY      Current Medications: No outpatient medications have been marked as taking for the 01/18/21 encounter (Office Visit) with Vickie Epley, MD.     Allergies:   Pravachol [pravastatin]   Social History   Socioeconomic History   Marital status: Widowed    Spouse name: Not on file   Number of children: Not on file   Years of education: Not on file   Highest education level: Not on file  Occupational History   Occupation: retired  Tobacco Use   Smoking status: Never   Smokeless tobacco: Never  Vaping Use   Vaping Use: Never used  Substance and Sexual Activity   Alcohol use: No   Drug use: No   Sexual activity: Not Currently    Partners:  Male  Other Topics Concern   Not on file  [facility], independent living.    Provide her with cleaning, one meal per day.    Social Determinants of Health   Financial Resource Strain: Low Risk    Difficulty of Paying Living Expenses: Not hard at all  Food Insecurity: No Food Insecurity   Worried About Charity fundraiser in the Last Year: Never true   [place] in the Last Year: Never true  Transportation Needs: No Transportation Needs   Lack of Transportation (Medical): No   Lack of Transportation (Non-Medical): No  Physical Activity: Sufficiently Active   Days of Exercise  per Week: 3 days   Minutes of Exercise per Session: 50 min  Stress: No Stress Concern Present   Feeling of Stress : Not at all  Social Connections: Unknown   Frequency of Communication with Friends and Family: Not on file   Frequency of Social Gatherings with Friends and Family: More than three times a week   Attends Religious Services: Not on Electrical engineer or Organizations: Not on file   Attends Archivist Meetings: Not on file   Marital Status: Not on file     Family History: The patient's family history includes Arthritis in her maternal grandfather, maternal grandmother, and mother; Diabetes in her mother; Heart disease in her father; Hyperlipidemia in her mother; Mental illness in her mother and sister. There is no history of Colon cancer, Stomach cancer, Esophageal cancer, Rectal cancer, or Colon polyps.  ROS:   Please see the history of present illness.    All other systems reviewed and are negative.  EKGs/Labs/Other Studies Reviewed:    The following studies were reviewed today:  January 18, 2021 in clinic device interrogation personally reviewed Lead parameter stable PMT episodes identified.  Extended the PVARP to 400 ms to cover these.  Turned on rate response of AV delays.  Decreased lead outputs to reflect chronicity.  EKG:  The ekg ordered today demonstrates atrial sensing, ventricular pacing  Recent Labs: 10/17/2020: Magnesium 1.9 11/05/2020: TSH 5.251 11/17/2020: ALT 13 12/27/2020: B Natriuretic Peptide 1,034.4 12/28/2020: BUN 40; Creatinine, Ser 1.00; Potassium 3.8; Sodium 136 12/29/2020: Hemoglobin 9.4; Platelets 320  Recent Lipid Panel    Component Value Date/Time   CHOL 168 11/17/2020 1427   TRIG 91 11/17/2020 1427   HDL 59 11/17/2020 1427   CHOLHDL 2.8 11/17/2020 1427   CHOLHDL 2 10/14/2019 1425   VLDL 17.8 10/14/2019 1425   LDLCALC 92 11/17/2020 1427    Physical Exam:    VS:  BP (!) 122/52 (BP Location: Left Arm, Patient  Position: Sitting, Cuff Size: Normal)    Pulse 62    Ht 5\' 4"  (1.626 m)    Wt 126 lb (57.2 kg)    SpO2 93%    BMI 21.63 kg/m     Wt Readings from Last 3 Encounters:  01/18/21 126 lb (57.2 kg)  12/28/20 124 lb 1.9 oz (56.3 kg)  12/19/20 134 lb (60.8 kg)     GEN:  Well nourished, well developed in no acute distress.  Elderly. HEENT: Normal NECK: No JVD; No carotid bruits LYMPHATICS: No lymphadenopathy CARDIAC: RRR, no murmurs, rubs, gallops.  Pacemaker pocket well-healed RESPIRATORY:  Clear to auscultation without rales, wheezing or rhonchi  ABDOMEN: Soft, non-tender, non-distended MUSCULOSKELETAL:  No edema; No deformity  SKIN: Warm and dry NEUROLOGIC:  Alert and oriented x  3 PSYCHIATRIC: Flat affect        ASSESSMENT:    1. Heart block AV complete ([place])   2. Cardiac pacemaker in situ    PLAN:    In order of problems listed above:  #Complete heart block #Pacemaker in situ Pacemaker dependent.  Pacemaker site healed well.  Lead parameters are all stable.  Reprogrammed PVARP to prevent PMT episodes. Continue remote monitoring  Follow-up in 9 months or sooner as needed.  APP okay.      Medication Adjustments/Labs and Tests Ordered: Current medicines are reviewed at length with the patient today.  Concerns regarding medicines are outlined above.  No orders of the defined types were placed in this encounter.  No orders of the defined types were placed in this encounter.    Signed, Lars Mage, MD, [facility], [facility] 01/18/2021 11:32 AM    Electrophysiology [facility]

## 2021-01-18 ENCOUNTER — Telehealth: Payer: Self-pay | Admitting: *Deleted

## 2021-01-18 ENCOUNTER — Other Ambulatory Visit: Payer: Self-pay

## 2021-01-18 ENCOUNTER — Encounter: Payer: Self-pay | Admitting: Cardiology

## 2021-01-18 ENCOUNTER — Ambulatory Visit (INDEPENDENT_AMBULATORY_CARE_PROVIDER_SITE_OTHER): Admitting: Cardiology

## 2021-01-18 VITALS — BP 122/52 | HR 62 | Ht 64.0 in | Wt 126.0 lb

## 2021-01-18 DIAGNOSIS — E785 Hyperlipidemia, unspecified: Secondary | ICD-10-CM

## 2021-01-18 DIAGNOSIS — Z950 Presence of cardiac pacemaker: Secondary | ICD-10-CM | POA: Diagnosis not present

## 2021-01-18 DIAGNOSIS — I442 Atrioventricular block, complete: Secondary | ICD-10-CM

## 2021-01-18 DIAGNOSIS — E1169 Type 2 diabetes mellitus with other specified complication: Secondary | ICD-10-CM

## 2021-01-18 NOTE — Telephone Encounter (Signed)
Patient had a visit today with Dr. Lalla Brothers. Her daughter is asking about getting lipid or any other labs needed set up prior to visit in February for Dr. Okey Dupre.

## 2021-01-18 NOTE — Patient Instructions (Addendum)
Your physician recommends that you continue on your current medications as directed. Please refer to the Current Medication list given to you today. *If you need a refill on your cardiac medications before your next appointment, please call your pharmacy*  Lab Work: None. If you have labs (blood work) drawn today and your tests are completely normal, you will receive your results only by: MyChart Message (if you have MyChart) OR A paper copy in the mail If you have any lab test that is abnormal or we need to change your treatment, we will call you to review the results.  Testing/Procedures: None.  Follow-Up: At [facility], you and your health needs are our priority.  As part of our continuing mission to provide you with exceptional heart care, we have created designated Provider Care Teams.  These Care Teams include your primary Cardiologist (physician) and Advanced Practice Providers (APPs -  Physician Assistants and Nurse Practitioners) who all work together to provide you with the care you need, when you need it.  Your physician wants you to follow-up in: 9 months with Steffanie Dunn or one of the following Advanced Practice Providers on your designated Care Team:   Ward Givens, NP Eula Listen PA Cadence Fransico Michael PA    You will receive a reminder letter in the mail two months in advance. If you don't receive a letter, please call our office to schedule the follow-up appointment.  Remote monitoring is used to monitor your Pacemaker from home. This monitoring reduces the number of office visits required to check your device to one time per year. It allows [place] to keep an eye on the functioning of your device to ensure it is working properly. You are scheduled for a device check from home on 04/12/21. You may send your transmission at any time that day. If you have a wireless device, the transmission will be sent automatically. After your physician reviews your transmission, you will receive a  postcard with your next transmission date.  We recommend signing up for the patient portal called "MyChart".  Sign up information is provided on this After Visit Summary.  MyChart is used to connect with patients for Virtual Visits (Telemedicine).  Patients are able to view lab/test results, encounter notes, upcoming appointments, etc.  Non-urgent messages can be sent to your provider as well.   To learn more about what you can do with MyChart, go to [email or web address].    Any Other Special Instructions Will Be Listed Below (If Applicable).

## 2021-01-18 NOTE — Telephone Encounter (Signed)
Called and spoke with pt's daughter.  Verified that pt does need fasting lipid panel prior to f/u with Dr. Okey Dupre. Notified that lab appt already in place for 02/14/20.  Confirmed appt with Dr. Okey Dupre 02/16/21 at 4:20. Daughter could not view on MyChart.  Notified dtr that pt has hospital follow up 02/03/21 with Cadence Fransico Michael, PA-C.  Daughter asks to cancel hospital follow up on 02/03/21.  Dtr states pt has hospice and does not want to bring to multiple appointments.  Advised that we may have labs drawn at hospital f/u and daughter refused further, does not want hospital follow up and asks to keep appt with Dr. Okey Dupre 02/16/21. Dtr also confirmed she will bring pt for fasting lab on 1/30.  Lab orders placed and linked to appt.  Dtr has no further questions at this time.

## 2021-01-20 ENCOUNTER — Ambulatory Visit: Payer: Medicare HMO | Admitting: Family

## 2021-01-23 NOTE — Progress Notes (Signed)
Remote pacemaker transmission.   

## 2021-01-24 NOTE — Progress Notes (Deleted)
Name: Amy Calhoun  Age/ Sex: 88 y.o., female   MRN/ DOB: [id number], 1928/08/06     PCP: Burnard Hawthorne, FNP   Reason for Endocrinology Evaluation: Type 2 Diabetes Mellitus  Initial Endocrine Consultative Visit: 04/15/2018    PATIENT IDENTIFIER: [name] is a 88 y.o. female with a past medical history of T2DM, Dyslipidemia, Hypothyroidism , heart block S/P pacemaker and HTN . The patient has followed with Endocrinology clinic since 04/15/2018 for consultative assistance with management of her diabetes.   Patient used to be cared for by her other daughter in [place] but daughter had passed due to a motorcycle accident and Amy Calhoun brought mother to [facility] in 2017.   DIABETIC HISTORY:  Amy Calhoun was diagnosed with DM at age 26. She has been on insulin since her diagnosis.She was briefly on glipizide but this was stopped in March, 2020. Pt did not recall any other oral glycemic agents. Her hemoglobin A1c has ranged from 6.5 % in 2018, peaking at 8.9%  In 2018.  Daughter would like to explore putting mother on glycemic agents, she is concerned about having MDI regimen as mother gets older and worried about future dexterity.   On her initial visit to our clinic, she was on Humulin-N BID, Humulin-R per SS and Tradjenta.  We stopped Tradjenta, started Trulicity in March, [id number], with reduction in insulin doses.   In 09/2018 Replaced Humulin-N with Lantus   SUBJECTIVE:   During the last visit (12/12/2020): A1c 8.6% no changes     Today (01/24/2021): Amy Calhoun is here with daughter Amy Calhoun for a follow up on diabetes .  She checks her blood sugars multiple times a day through the freestyle libre. The patient has not hypoglycemic episodes since the last clinic visit.    She was hospitalized 10/10/2020 for heart block requiring pace maker , her course complicated by AKI  Saw cardiology 11/17/2020 She continues with dysphagia, has hx of multiple esophageal dilatations - saw PCP  11/2020  Presented to the ED 10/22 for a mechanical fall   She has been home for the past 2 weeks     HOME DIABETES REGIMEN:  Lantus 14 units daily  Humalog 8 units TID QAC CF: Novolog ( BG- 130/60)   CONTINUOUS GLUCOSE MONITORING RECORD INTERPRETATION    Dates of Recording: 11/15-11/28/2022  Sensor description:Freestyle Libre  Results statistics:   CGM use % of time 87  Average and SD 224/36.1  Time in range  34%  % Time Above 180 33  % Time above 250 33  % Time Below target 0     Glycemic patterns summary: Hyperglycemia during the day and night  Hypoglycemic episodes occurred during the day  Overnight periods: trends down     HISTORY:  Past Medical History:  Past Medical History:  Diagnosis Date   Allergy    Ambulates with cane    Anemia    At high risk for falls    Cataract    removed both eyes   Chronic kidney disease 08/07/2016   Chronic renal failure, stage III   Depression    Esophageal dysphagia    Gastric ulcer    GERD (gastroesophageal reflux disease)    Hyperlipidemia    Hypertension    Hypothyroidism    IDDM (insulin dependent diabetes mellitus)    Ischemic colitis ([place])    Neuromuscular disorder ([facility])    neuropathy   Osteoarthritis    Ulcer of esophagus    Past  Surgical History:  Past Surgical History:  Procedure Laterality Date   ABDOMINAL HYSTERECTOMY     CATARACT EXTRACTION, BILATERAL     CHOLECYSTECTOMY     COLONOSCOPY  2010   ESOPHAGEAL DILATION  08/07/2016   usually a couple times a year, last time 07/30/16   GALLBLADDER SURGERY     LEFT HEART CATH AND CORONARY ANGIOGRAPHY N/A 10/11/2020   Procedure: LEFT HEART CATH AND CORONARY ANGIOGRAPHY;  Surgeon: Nelva Bush, MD;  Location: [place] [facility];  Service: Cardiovascular;  Laterality: N/A;   LUMBAR LAMINECTOMY/DECOMPRESSION MICRODISCECTOMY N/A 08/10/2016   Procedure: BILATERAL HEMILAMINECTOMY LUMBAR THREE-FOUR,LUMBAR FOUR-FIVE,LEFT LUMBAR FIVE-SACRAL ONE  HEMILAMINECTOMY AND DECOMPRESSION;  Surgeon: Ashok Pall, MD;  Location: [place];  Service: Neurosurgery;  Laterality: N/A;   PACEMAKER IMPLANT N/A 10/12/2020   Procedure: PACEMAKER IMPLANT;  Surgeon: Vickie Epley, MD;  Location: [place] [facility];  Service: Cardiovascular;  Laterality: N/A;   TEMPORARY PACEMAKER N/A 10/11/2020   Procedure: TEMPORARY PACEMAKER;  Surgeon: Nelva Bush, MD;  Location: [place] [facility];  Service: Cardiovascular;  Laterality: N/A;   THYROIDECTOMY     UPPER GASTROINTESTINAL ENDOSCOPY     Social History:  reports that she has never smoked. She has never used smokeless tobacco. She reports that she does not drink alcohol and does not use drugs. Family History:  Family History  Problem Relation Age of Onset   Diabetes Mother    Arthritis Mother    Hyperlipidemia Mother    Mental illness Mother    Heart disease Father    Mental illness Sister    Arthritis Maternal Grandmother    Arthritis Maternal Grandfather    Colon cancer Neg Hx    Stomach cancer Neg Hx    Esophageal cancer Neg Hx    Rectal cancer Neg Hx    Colon polyps Neg Hx      HOME MEDICATIONS: Allergies as of 01/25/2021       Reactions   Pravachol [pravastatin] Other (See Comments)   States she refused due to side effects        Medication List        Accurate as of January 24, 2021  1:40 PM. If you have any questions, ask your nurse or doctor.          Acetaminophen-Codeine 300-30 MG tablet Take 1 tablet by mouth every 12 (twelve) hours as needed for pain (moderate to severe pain).   albuterol (2.5 MG/3ML) 0.083% nebulizer solution Commonly known as: PROVENTIL Take 3 mLs (2.5 mg total) by nebulization every 6 (six) hours as needed for wheezing or shortness of breath.   albuterol 108 (90 Base) MCG/ACT inhaler Commonly known as: VENTOLIN HFA Inhale 2 puffs into the lungs every 6 (six) hours as needed for wheezing or shortness of breath.   amitriptyline 100 MG  tablet Commonly known as: ELAVIL Take 1 tablet (100 mg total) by mouth at bedtime.   ascorbic acid 500 MG tablet Commonly known as: VITAMIN C Take 500 mg by mouth daily.   aspirin 81 MG EC tablet Take 1 tablet (81 mg total) by mouth daily. Swallow whole.   Bariatric Rollator Misc Use as needed   benzonatate 100 MG capsule Commonly known as: TESSALON Take 1 capsule (100 mg total) by mouth 3 (three) times daily.   budesonide-formoterol 80-4.5 MCG/ACT inhaler Commonly known as: SYMBICORT Inhale 2 puffs into the lungs 2 (two) times daily.   docusate sodium 100 MG capsule Commonly known as: COLACE Take 1  capsule (100 mg total) by mouth 2 (two) times daily.   DODEX IJ Inject as directed every 30 (thirty) days.   ezetimibe 10 MG tablet Commonly known as: ZETIA Take 1 tablet (10 mg total) by mouth daily.   ferrous sulfate 324 MG Tbec Take 324 mg by mouth daily with breakfast.   FreeStyle Libre 14 Day Sensor Misc 1 Package by Does not apply route every 14 (fourteen) days.   furosemide 20 MG tablet Commonly known as: LASIX TAKE 1 TABLET BY MOUTH EVERY DAY   gabapentin 100 MG capsule Commonly known as: NEURONTIN Take 2 capsules (200 mg total) by mouth 3 (three) times daily. What changed: how much to take   glucagon 1 MG injection Inject 1 mg into the muscle once as needed for up to 1 dose. Hypoglycemia If she is not tolerating PO.   guaiFENesin 100 MG/5ML liquid Commonly known as: ROBITUSSIN Take 10 mLs by mouth every 4 (four) hours as needed for cough or to loosen phlegm.   HYDROcodone-acetaminophen 10-325 MG tablet Commonly known as: NORCO Take 1 tablet by mouth every 6 (six) hours as needed.   insulin lispro 100 UNIT/ML KwikPen Commonly known as: HumaLOG KwikPen Inject 8 Units into the skin 3 (three) times daily. Max Daily 30 units   Insulin Pen Needle 32G X 4 MM Misc 1 Device by Does not apply route in the morning, at noon, in the evening, and at bedtime.    isosorbide mononitrate 30 MG 24 hr tablet Commonly known as: IMDUR Take 1 tablet (30 mg total) by mouth daily.   LANTUS SOLOSTAR [place] Inject 14 Units into the skin at bedtime.   levothyroxine 150 MCG tablet Commonly known as: SYNTHROID TAKE 1 TABLET DAILY What changed: when to take this   losartan 50 MG tablet Commonly known as: COZAAR Take 1 tablet (50 mg total) by mouth daily.   metoprolol tartrate 25 MG tablet Commonly known as: LOPRESSOR Take 25 mg by mouth. 1/2 tablet in AM and 1 tablet in the evening   nitroGLYCERIN 0.4 MG SL tablet Commonly known as: NITROSTAT Place 1 tablet (0.4 mg total) under the tongue every 5 (five) minutes x 3 doses as needed for chest pain.   omeprazole 40 MG capsule Commonly known as: PRILOSEC TAKE 1 CAPSULE TWICE A DAY   PRESERVISION AREDS PO Take 1 tablet by mouth daily.   Vitamin D 50 MCG (2000 UT) tablet Take 2,000 Units by mouth daily.   Wixela Inhub 100-50 MCG/ACT Aepb Generic drug: fluticasone-salmeterol Inhale 1 puff into the lungs 2 (two) times daily.         OBJECTIVE:   Vital Signs: There were no vitals taken for this visit.  Wt Readings from Last 3 Encounters:  01/18/21 126 lb (57.2 kg)  12/28/20 124 lb 1.9 oz (56.3 kg)  12/19/20 134 lb (60.8 kg)     Exam: General: Pt appears well and is in NAD  Lungs: Clear with good BS bilat   Heart: RRR   Extremities: No pretibial edema.   DM FOOT EXAM 09/21/2020 The skin of the feet is intact without sores or ulcerations. The pedal pulses are decreased. The sensation is absent  to a screening 5.07, 10 gram monofilament bilaterally    DATA REVIEWED:  Lab Results  Component Value Date   HGBA1C 9.1 (A) 12/12/2020   HGBA1C 9.4 (A) 09/21/2020   HGBA1C 8.6 (A) 03/16/2020   Lab Results  Component Value Date   MICROALBUR 1.8 03/09/2020  LDLCALC 92 11/17/2020   CREATININE 1.00 12/28/2020   Lab Results  Component Value Date   MICRALBCREAT 4.5 03/09/2020     Lab  Results  Component Value Date   CHOL 168 11/17/2020   HDL 59 11/17/2020   LDLCALC 92 11/17/2020   TRIG 91 11/17/2020   CHOLHDL 2.8 11/17/2020        ASSESSMENT / PLAN / RECOMMENDATIONS:   1) Insulin-Dependent, Poorly controlled, With CKD III and neuropathic complications - Most recent A1c of 9.1 %. Goal A1c < 8.0%.    - In review of her CGM download she has been noted with hyperglycemia during the day and night for the past 2 weeks  - Pt is currently living with her daughter , daughter is nervous about hypoglycemia so she tends to not give her prandial insulin with a late supper, she also adjusts basal insulin based on bedtime BG's or how much food she is going to eat etc  - We again discussed not adjusting basal insulin based on consumed meals, I have asked her not to adjust basal insulin up and down on daily basis as this will affect glucose excursions  - We again discussed HUmalog is the prandial insulin and that could be reduced if she is going to eat less food then usual, Unfortunately there was an incident where the pt was given prandial insulin but the pt had a severe case of dysphagia and was unable to finish food which results in hypoglycemia the rest of that day, its ok to wait on the Humalog until the pt has completed her meal  - I have encouraged her to use the correction scale based on pre-meal glucose data rather then post meal  - She is on much higher dose of lantus then previously prescribed, I don;t see any evidence of hypoglycemia over night, so will continue for now  - At her advanced age the goal is to prevent hypoglycemia as well as severe hyperglycemia   MEDICATIONS:  Continue  Lantus 14 Units QHS Continue  Humalog 8 units with each meal  CF: Novolog ( BG- 130/60)  EDUCATION / INSTRUCTIONS: BG monitoring instructions: Patient is instructed to check her blood sugars 4 times a day, before meals and bedtime. Call [place] [facility] if: BG persistently < 70   I reviewed the Rule of 15 for the treatment of hypoglycemia in detail with the patient. Literature supplied.    F/U in 4 months    Signed electronically by: Lyndle Herrlich, MD  [facility] Endocrinology  [facility] [address] [place], [place] [address] Phone: [phone] FAX: [phone]   CC: Allegra Grana, FNP [address] [place] [address] Phone: [phone]  Fax: [phone]  Return to Endocrinology clinic as below: Future Appointments  Date Time Provider Department Center  01/25/2021  2:20 PM Daniell Paradise, Konrad Dolores, MD [facility] None  02/08/2021  9:45 AM [facility] PHARMACIST [facility]  02/13/2021  9:00 AM [facility] NURSE [facility]  02/15/2021  1:30 PM Allegra Grana, FNP [facility]  02/16/2021  4:20 PM End, Cristal Deer, MD [facility]  04/12/2021  7:00 AM [facility] DEVICE REMOTES [facility]  04/13/2021  1:00 PM Nnamdi Dacus, Konrad Dolores, MD [facility] None  07/12/2021  7:00 AM [facility] DEVICE REMOTES [facility]  10/11/2021  7:00 AM [facility] DEVICE REMOTES [facility]  01/10/2022  7:00 AM [facility] DEVICE REMOTES [facility]  04/11/2022  7:00 AM [facility] DEVICE REMOTES [facility]

## 2021-01-25 ENCOUNTER — Ambulatory Visit: Payer: Medicare HMO | Admitting: Internal Medicine

## 2021-01-26 ENCOUNTER — Other Ambulatory Visit: Payer: Self-pay

## 2021-01-26 ENCOUNTER — Other Ambulatory Visit: Payer: Self-pay | Admitting: Family

## 2021-01-26 ENCOUNTER — Ambulatory Visit (INDEPENDENT_AMBULATORY_CARE_PROVIDER_SITE_OTHER): Admitting: Internal Medicine

## 2021-01-26 ENCOUNTER — Encounter: Payer: Self-pay | Admitting: Internal Medicine

## 2021-01-26 ENCOUNTER — Telehealth: Payer: Self-pay | Admitting: Internal Medicine

## 2021-01-26 VITALS — BP 128/68 | HR 60 | Ht 64.0 in | Wt 129.0 lb

## 2021-01-26 DIAGNOSIS — N1831 Chronic kidney disease, stage 3a: Secondary | ICD-10-CM

## 2021-01-26 DIAGNOSIS — E1122 Type 2 diabetes mellitus with diabetic chronic kidney disease: Secondary | ICD-10-CM | POA: Diagnosis not present

## 2021-01-26 DIAGNOSIS — E039 Hypothyroidism, unspecified: Secondary | ICD-10-CM

## 2021-01-26 DIAGNOSIS — Z794 Long term (current) use of insulin: Secondary | ICD-10-CM

## 2021-01-26 DIAGNOSIS — E1142 Type 2 diabetes mellitus with diabetic polyneuropathy: Secondary | ICD-10-CM

## 2021-01-26 DIAGNOSIS — E1165 Type 2 diabetes mellitus with hyperglycemia: Secondary | ICD-10-CM | POA: Diagnosis not present

## 2021-01-26 LAB — TSH: TSH: 6.73 u[IU]/mL — ABNORMAL HIGH (ref 0.35–5.50)

## 2021-01-26 NOTE — Progress Notes (Signed)
Name: Amy Calhoun  Age/ Sex: 88 y.o., female   MRN/ DOB: [id number], 1928/09/03     PCP: Burnard Hawthorne, FNP   Reason for Endocrinology Evaluation: Type 2 Diabetes Mellitus  Initial Endocrine Consultative Visit: 04/15/2018    PATIENT IDENTIFIER: Amy Calhoun is a 88 y.o. female with a past medical history of T2DM, Dyslipidemia, Hypothyroidism , heart block S/P pacemaker and HTN . The patient has followed with Endocrinology clinic since 04/15/2018 for consultative assistance with management of her diabetes.   Patient used to be cared for by her other daughter in [place] but daughter had passed due to a motorcycle accident and [name] brought mother to [facility] in 2017.   DIABETIC HISTORY:  Amy Calhoun was diagnosed with DM at age 7. She has been on insulin since her diagnosis.She was briefly on glipizide but this was stopped in March, 2020. Pt did not recall any other oral glycemic agents. Her hemoglobin A1c has ranged from 6.5 % in 2018, peaking at 8.9%  In 2018.  Daughter would like to explore putting mother on glycemic agents, she is concerned about having MDI regimen as mother gets older and worried about future dexterity.   On her initial visit to our clinic, she was on Humulin-N BID, Humulin-R per SS and Tradjenta.  We stopped Tradjenta, started Trulicity in March, [id number], with reduction in insulin doses.   In 09/2018 Replaced Humulin-N with Lantus   SUBJECTIVE:   During the last visit (12/12/2020): A1c 8.6% no changes     Today (01/26/2021): Amy Calhoun is here with daughter [name] for a follow up on diabetes .  She checks her blood sugars multiple times a day through the freestyle libre. The patient has not had hypoglycemic episodes since the last clinic visit.    She was hospitalized 10/10/2020 for heart block requiring pace maker , her course complicated by AKI   She had another admission for NSTEMI 12/2020  She is now under hospice care, Alvester Chou, NP does home  visits for her and per daughter she wants me to check her TSH and adjust levothyroxine if needed     HOME DIABETES REGIMEN:  Lantus 14 units daily  Humalog 8 units TID QAC CF: Novolog ( BG- 130/60)   CONTINUOUS GLUCOSE MONITORING RECORD INTERPRETATION    Dates of Recording: 12/30-1/12/2021  Sensor description:Freestyle Libre  Results statistics:   CGM use % of time 80  Average and SD 247/39.9  Time in range  29%  % Time Above 180 21  % Time above 250 48  % Time Below target 1     Glycemic patterns summary: Hyperglycemia during the day , BG's optimal at night  Hypoglycemic episodes occurred during the day  Overnight periods: trends down     HISTORY:  Past Medical History:  Past Medical History:  Diagnosis Date   Allergy    Ambulates with cane    Anemia    At high risk for falls    Cataract    removed both eyes   Chronic kidney disease 08/07/2016   Chronic renal failure, stage III   Depression    Esophageal dysphagia    Gastric ulcer    GERD (gastroesophageal reflux disease)    Hyperlipidemia    Hypertension    Hypothyroidism    IDDM (insulin dependent diabetes mellitus)    Ischemic colitis ([place])    Neuromuscular disorder (HCC)    neuropathy   Osteoarthritis    Ulcer of esophagus  Past Surgical History:  Past Surgical History:  Procedure Laterality Date   ABDOMINAL HYSTERECTOMY     CATARACT EXTRACTION, BILATERAL     CHOLECYSTECTOMY     COLONOSCOPY  2010   ESOPHAGEAL DILATION  08/07/2016   usually a couple times a year, last time 07/30/16   GALLBLADDER SURGERY     LEFT HEART CATH AND CORONARY ANGIOGRAPHY N/A 10/11/2020   Procedure: LEFT HEART CATH AND CORONARY ANGIOGRAPHY;  Surgeon: Nelva Bush, MD;  Location: [facility];  Service: Cardiovascular;  Laterality: N/A;   LUMBAR LAMINECTOMY/DECOMPRESSION MICRODISCECTOMY N/A 08/10/2016   Procedure: BILATERAL HEMILAMINECTOMY LUMBAR THREE-FOUR,LUMBAR FOUR-FIVE,LEFT LUMBAR FIVE-SACRAL ONE  HEMILAMINECTOMY AND DECOMPRESSION;  Surgeon: Ashok Pall, MD;  Location: [facility];  Service: Neurosurgery;  Laterality: N/A;   PACEMAKER IMPLANT N/A 10/12/2020   Procedure: PACEMAKER IMPLANT;  Surgeon: Vickie Epley, MD;  Location: [facility];  Service: Cardiovascular;  Laterality: N/A;   TEMPORARY PACEMAKER N/A 10/11/2020   Procedure: TEMPORARY PACEMAKER;  Surgeon: Nelva Bush, MD;  Location: [facility];  Service: Cardiovascular;  Laterality: N/A;   THYROIDECTOMY     UPPER GASTROINTESTINAL ENDOSCOPY     Social History:  reports that she has never smoked. She has never used smokeless tobacco. She reports that she does not drink alcohol and does not use drugs. Family History:  Family History  Problem Relation Age of Onset   Diabetes Mother    Arthritis Mother    Hyperlipidemia Mother    Mental illness Mother    Heart disease Father    Mental illness Sister    Arthritis Maternal Grandmother    Arthritis Maternal Grandfather    Colon cancer Neg Hx    Stomach cancer Neg Hx    Esophageal cancer Neg Hx    Rectal cancer Neg Hx    Colon polyps Neg Hx      HOME MEDICATIONS: Allergies as of 01/26/2021       Reactions   Pravachol [pravastatin] Other (See Comments)   States she refused due to side effects        Medication List        Accurate as of January 26, 2021 10:52 AM. If you have any questions, ask your nurse or doctor.          Acetaminophen-Codeine 300-30 MG tablet Take 1 tablet by mouth every 12 (twelve) hours as needed for pain (moderate to severe pain).   albuterol (2.5 MG/3ML) 0.083% nebulizer solution Commonly known as: PROVENTIL Take 3 mLs (2.5 mg total) by nebulization every 6 (six) hours as needed for wheezing or shortness of breath.   albuterol 108 (90 Base) MCG/ACT inhaler Commonly known as: VENTOLIN HFA Inhale 2 puffs into the lungs every 6 (six) hours as needed for wheezing or shortness of breath.   amitriptyline 100 MG  tablet Commonly known as: ELAVIL Take 1 tablet (100 mg total) by mouth at bedtime.   ascorbic acid 500 MG tablet Commonly known as: VITAMIN C Take 500 mg by mouth daily.   aspirin 81 MG EC tablet Take 1 tablet (81 mg total) by mouth daily. Swallow whole.   Bariatric Rollator Misc Use as needed   benzonatate 100 MG capsule Commonly known as: TESSALON Take 1 capsule (100 mg total) by mouth 3 (three) times daily.   budesonide-formoterol 80-4.5 MCG/ACT inhaler Commonly known as: SYMBICORT Inhale 2 puffs into the lungs 2 (two) times daily.   docusate sodium 100 MG capsule Commonly known as: COLACE Take 1  capsule (100 mg total) by mouth 2 (two) times daily.   DODEX IJ Inject as directed every 30 (thirty) days.   ezetimibe 10 MG tablet Commonly known as: ZETIA Take 1 tablet (10 mg total) by mouth daily.   ferrous sulfate 324 MG Tbec Take 324 mg by mouth daily with breakfast.   FreeStyle Libre 14 Day Sensor Misc 1 Package by Does not apply route every 14 (fourteen) days.   furosemide 20 MG tablet Commonly known as: LASIX TAKE 1 TABLET BY MOUTH EVERY DAY   gabapentin 100 MG capsule Commonly known as: NEURONTIN Take 2 capsules (200 mg total) by mouth 3 (three) times daily. What changed: how much to take   glucagon 1 MG injection Inject 1 mg into the muscle once as needed for up to 1 dose. Hypoglycemia If she is not tolerating PO.   guaiFENesin 100 MG/5ML liquid Commonly known as: ROBITUSSIN Take 10 mLs by mouth every 4 (four) hours as needed for cough or to loosen phlegm.   HYDROcodone-acetaminophen 10-325 MG tablet Commonly known as: NORCO Take 1 tablet by mouth every 6 (six) hours as needed.   insulin lispro 100 UNIT/ML KwikPen Commonly known as: HumaLOG KwikPen Inject 8 Units into the skin 3 (three) times daily. Max Daily 30 units   Insulin Pen Needle 32G X 4 MM Misc 1 Device by Does not apply route in the morning, at noon, in the evening, and at bedtime.    isosorbide mononitrate 30 MG 24 hr tablet Commonly known as: IMDUR Take 1 tablet (30 mg total) by mouth daily.   LANTUS SOLOSTAR [place] Inject 14 Units into the skin at bedtime.   levothyroxine 150 MCG tablet Commonly known as: SYNTHROID TAKE 1 TABLET DAILY What changed: when to take this   losartan 50 MG tablet Commonly known as: COZAAR Take 1 tablet (50 mg total) by mouth daily.   metoprolol tartrate 25 MG tablet Commonly known as: LOPRESSOR Take 25 mg by mouth. 1/2 tablet in AM and 1 tablet in the evening   nitroGLYCERIN 0.4 MG SL tablet Commonly known as: NITROSTAT Place 1 tablet (0.4 mg total) under the tongue every 5 (five) minutes x 3 doses as needed for chest pain.   omeprazole 40 MG capsule Commonly known as: PRILOSEC TAKE 1 CAPSULE TWICE A DAY   PRESERVISION AREDS PO Take 1 tablet by mouth daily.   prochlorperazine 10 MG tablet Commonly known as: COMPAZINE Take 10 mg by mouth every 4 (four) hours as needed.   Thick-It Powd Generic drug: STARCH-MALTO DEXTRIN See admin instructions.   Vitamin D 50 MCG (2000 UT) tablet Take 2,000 Units by mouth daily.   Wixela Inhub 100-50 MCG/ACT Aepb Generic drug: fluticasone-salmeterol Inhale 1 puff into the lungs 2 (two) times daily.         OBJECTIVE:   Vital Signs: BP 128/68 (BP Location: Left Arm, Patient Position: Sitting, Cuff Size: Small)    Pulse 60    Ht 5\' 4"  (1.626 m)    Wt 129 lb (58.5 kg)    SpO2 97%    BMI 22.14 kg/m   Wt Readings from Last 3 Encounters:  01/26/21 129 lb (58.5 kg)  01/18/21 126 lb (57.2 kg)  12/28/20 124 lb 1.9 oz (56.3 kg)     Exam: General: Pt appears well and is in NAD  Lungs: Clear with good BS bilat   Heart: RRR   Extremities: No pretibial edema.   DM FOOT EXAM 09/21/2020 The skin of the  feet is intact without sores or ulcerations. The pedal pulses are decreased. The sensation is absent  to a screening 5.07, 10 gram monofilament bilaterally    DATA REVIEWED:  Lab  Results  Component Value Date   HGBA1C 9.1 (A) 12/12/2020   HGBA1C 9.4 (A) 09/21/2020   HGBA1C 8.6 (A) 03/16/2020   Lab Results  Component Value Date   MICROALBUR 1.8 03/09/2020   LDLCALC 92 11/17/2020   CREATININE 1.00 12/28/2020   Lab Results  Component Value Date   MICRALBCREAT 4.5 03/09/2020     Lab Results  Component Value Date   CHOL 168 11/17/2020   HDL 59 11/17/2020   LDLCALC 92 11/17/2020   TRIG 91 11/17/2020   CHOLHDL 2.8 11/17/2020       Latest Reference Range & Units 01/26/21 11:26  TSH 0.35 - 5.50 uIU/mL 6.73 (H)  (H): Data is abnormally high  ASSESSMENT / PLAN / RECOMMENDATIONS:   1) Insulin-Dependent, Poorly controlled, With CKD III and neuropathic complications - Most recent A1c of 9.1 %. Goal A1c < 8.0%.    - In review of her CGM download she has been noted with hyperglycemia during the day , hypoglycemia resolves overnight. - Pt is currently living with her daughter , daughter continues to be  nervous about hypoglycemia  -I have again advised her not to hold basal insulin if bedtime BG is normal, but if she is concerned she may give 10 units instead of 14 units but she should not hold basal insulin altogether because this will result in severe hyperglycemia the following day.  -Due to recurrent dysphagia, she has been advised to give prandial insulin after the meal to make sure patient has swallowed all food. -I have asked her to use Premeal glucose to determine any additional Humalog doses needed based on the scale but not to use postprandial glucose reading for that as that will increase the risk of hypoglycemia.  If daughter was unable to determine Premeal glucose reading she can just give 8 units of Humalog with that meal.    MEDICATIONS:  Continue  Lantus 14 Units QHS Continue  Humalog 8 units with each meal  CF: Novolog ( BG- 130/60)  EDUCATION / INSTRUCTIONS: BG monitoring instructions: Patient is instructed to check her blood sugars 4 times  a day, before meals and bedtime. Call [facility] if: BG persistently < 70  I reviewed the Rule of 15 for the treatment of hypoglycemia in detail with the patient. Literature supplied.   2. Hypothyroidism:   -TSH elevated, will increase levothyroxine as below -We will recheck in 8 weeks  Medication Levothyroxine 150 mcg, TWO tabs on Sundays and 1 tab the rest of the week     F/U in 6 months    Signed electronically by: Mack Guise, MD  [facility]  [facility] [address] [place],  [address] Phone: [phone] FAX: [phone]   CC: Burnard Hawthorne, FNP [address] [place] [address] Phone: [phone]  Fax: [phone]  Return to Endocrinology clinic as below: Future Appointments  Date Time Provider [place]  02/08/2021  9:45 AM [facility] CCM PHARMACIST [facility]  02/13/2021  9:00 AM [facility] NURSE [facility]  02/15/2021  1:30 PM Burnard Hawthorne, FNP [facility]  02/16/2021  4:20 PM End, Harrell Gave, MD [facility]  04/12/2021  7:00 AM [facility] DEVICE REMOTES [facility]  04/13/2021  1:00 PM Reva Pinkley, Melanie Crazier, MD [facility]  07/12/2021  7:00 AM [facility] DEVICE REMOTES [facility]  10/11/2021  7:00 AM [facility] DEVICE REMOTES [facility]  01/10/2022  7:00 AM [facility] DEVICE REMOTES [facility]  04/11/2022  7:00 AM [facility] DEVICE REMOTES [facility]

## 2021-01-26 NOTE — Telephone Encounter (Signed)
Please cancel patient's appointment for March but keep the one  in July   Thanks

## 2021-01-26 NOTE — Patient Instructions (Addendum)
°-   Continue  Lantus 14 Units bedtime  - Continue Humalog 8 units with each meal  - Humalog correctional insulin:  Use the scale below to help guide you Blood sugar before meal Number of units to inject  Less than 190 0 unit  191 -  250 1 units  251 -  310 2 units  311 -  370 3 units  371 -  430 4 units  431 -  490 5 units  491 -  550 6 units        HOW TO TREAT LOW BLOOD SUGARS (Blood sugar LESS THAN 70 MG/DL) Please follow the RULE OF 15 for the treatment of hypoglycemia treatment (when your (blood sugars are less than 70 mg/dL)   STEP 1: Take 15 grams of carbohydrates when your blood sugar is low, which includes:  3-4 GLUCOSE TABS  OR 3-4 OZ OF JUICE OR REGULAR SODA OR ONE TUBE OF GLUCOSE GEL    STEP 2: RECHECK blood sugar in 15 MINUTES STEP 3: If your blood sugar is still low at the 15 minute recheck --> then, go back to STEP 1 and treat AGAIN with another 15 grams of carbohydrates.

## 2021-01-27 ENCOUNTER — Encounter: Payer: Self-pay | Admitting: Internal Medicine

## 2021-01-27 ENCOUNTER — Other Ambulatory Visit: Payer: Self-pay | Admitting: Family

## 2021-01-27 MED ORDER — LEVOTHYROXINE SODIUM 150 MCG PO TABS: 150.0000 ug | ORAL_TABLET | ORAL | 3 refills | Status: DC

## 2021-01-27 NOTE — Telephone Encounter (Signed)
My chart message has been sent and vm left.

## 2021-01-27 NOTE — Telephone Encounter (Signed)
Done

## 2021-01-27 NOTE — Telephone Encounter (Signed)
[name] (Patient's daughter) requests to be called at ph# [phone] re: Patient's TSH Lab result that was sent to Patient's MyChart. Emma requests to discuss the above result.

## 2021-01-27 NOTE — Telephone Encounter (Signed)
Vm left for patient daughter to give a call back

## 2021-02-03 ENCOUNTER — Ambulatory Visit: Payer: Medicare HMO | Admitting: Medical

## 2021-02-08 ENCOUNTER — Telehealth: Payer: Medicare HMO

## 2021-02-12 ENCOUNTER — Encounter: Payer: Self-pay | Admitting: Internal Medicine

## 2021-02-12 NOTE — Progress Notes (Signed)
Follow-up Outpatient Visit Date: 02/16/2021  Primary Care Provider: No primary care provider on file. No primary provider on file.  Chief Complaint: Follow-up coronary artery disease and heart block  HPI:  [name] is a 88 y.o. female with history of single-vessel CAD with diffuse disease of up to 90% affecting relatively small RCA, complete heart block status post dual chamber pacemaker (09/2020), hypertension, hyperlipidemia, diabetes mellitus complicated by peripheral neuropathy, orthostatic hypotension, hypothyroidism, and GERD, who presents for follow-up of coronary artery disease and heart block.  She initially presented with syncope and upper back pain in 09/2020 and was found to have intermittent complete heart block with ventricular pauses of up to 6 seconds.  She underwent diagnostic catheterization (severe RCA disease with recommendation for medical management) and temporary transvenous pacemaker placement.  Permanent pacemaker was placed the next day.  She was seen for follow-up in early December by Terrilee Croak, [place], at which time [name] complained of generalized weakness/malaise and dizziness.  She had self-discontinued some of her medications.  No medication changes were made, though consideration of statin therapy was recommended.  She was hospitalized the following week with 4 days of constant chest pain and was found to have an elevated troponin consistent with NSTEMI.  There was also concern for possible pulmonary infection (including aspiration pneumonitis).  Medical therapy was recommended, given her age, frailty, and challenging coronary anatomy seen on cath in September.  [name] saw Dr. Lalla Brothers earlier this month and reported feeling fairly well, though she was now enrolled in outpatient hospice.  Device interrogation revealed 25 episodes of PMT; reprogramming was performed to lower risk for future PMT.  Today, [name] and her daughter, who accompanies her today, note  ongoing fatigue though she may be a little bit more energetic.  [name] and her daughter are concerned that medications could be contributing.  [name] is no longer using supplemental oxygen, though her daughter would like to keep this on hand just in case.  She has not had any chest pain, shortness of breath, palpitations, lightheadedness, or edema.  --------------------------------------------------------------------------------------------------  Past Medical History:  Diagnosis Date   Allergy    Ambulates with cane    Anemia    At high risk for falls    Cataract    removed both eyes   Chronic kidney disease 08/07/2016   Chronic renal failure, stage III   Coronary artery disease    Severe, diffuse RCA disease by cath (09/2020).  NSTEMI (12/2020) - medical therapy recommended   Depression    Esophageal dysphagia    Gastric ulcer    GERD (gastroesophageal reflux disease)    Hyperlipidemia    Hypertension    Hypothyroidism    IDDM (insulin dependent diabetes mellitus)    Ischemic colitis (HCC)    Neuromuscular disorder (HCC)    neuropathy   Osteoarthritis    Ulcer of esophagus    Past Surgical History:  Procedure Laterality Date   ABDOMINAL HYSTERECTOMY     CATARACT EXTRACTION, BILATERAL     CHOLECYSTECTOMY     COLONOSCOPY  2010   ESOPHAGEAL DILATION  08/07/2016   usually a couple times a year, last time 07/30/16   GALLBLADDER SURGERY     LEFT HEART CATH AND CORONARY ANGIOGRAPHY N/A 10/11/2020   Procedure: LEFT HEART CATH AND CORONARY ANGIOGRAPHY;  Surgeon: Yvonne Kendall, MD;  Location: [facility];  Service: Cardiovascular;  Laterality: N/A;   LUMBAR LAMINECTOMY/DECOMPRESSION MICRODISCECTOMY N/A 08/10/2016   Procedure:  BILATERAL HEMILAMINECTOMY LUMBAR THREE-FOUR,LUMBAR FOUR-FIVE,LEFT LUMBAR FIVE-SACRAL ONE HEMILAMINECTOMY AND DECOMPRESSION;  Surgeon: Ashok Pall, MD;  Location: [place];  Service: Neurosurgery;  Laterality: N/A;   PACEMAKER IMPLANT N/A 10/12/2020    Procedure: PACEMAKER IMPLANT;  Surgeon: Vickie Epley, MD;  Location: [place] [facility];  Service: Cardiovascular;  Laterality: N/A;   TEMPORARY PACEMAKER N/A 10/11/2020   Procedure: TEMPORARY PACEMAKER;  Surgeon: Nelva Bush, MD;  Location: [place] [facility];  Service: Cardiovascular;  Laterality: N/A;   THYROIDECTOMY     UPPER GASTROINTESTINAL ENDOSCOPY       Recent CV Pertinent Labs: Lab Results  Component Value Date   CHOL 134 02/13/2021   HDL 47 02/13/2021   LDLCALC 71 02/13/2021   TRIG 82 02/13/2021   CHOLHDL 2.9 02/13/2021   CHOLHDL 2 10/14/2019   INR 1.0 12/27/2020   BNP 1,034.4 (H) 12/27/2020   K 3.8 12/28/2020   MG 1.9 10/17/2020   BUN 40 (H) 12/28/2020   BUN 16 11/17/2020   CREATININE 1.00 12/28/2020    Past medical and surgical history were reviewed and updated in EPIC.  Current Meds  Medication Sig   Acetaminophen-Codeine 300-30 MG tablet Take 1 tablet by mouth every 12 (twelve) hours as needed for pain (moderate to severe pain).   albuterol (PROVENTIL) (2.5 MG/3ML) 0.083% nebulizer solution Take 3 mLs (2.5 mg total) by nebulization every 6 (six) hours as needed for wheezing or shortness of breath.   albuterol (VENTOLIN HFA) 108 (90 Base) MCG/ACT inhaler Inhale 2 puffs into the lungs every 6 (six) hours as needed for wheezing or shortness of breath.   amitriptyline (ELAVIL) 100 MG tablet Take 1 tablet (100 mg total) by mouth at bedtime.   ascorbic acid (VITAMIN C) 500 MG tablet Take 500 mg by mouth daily.   aspirin EC 81 MG EC tablet Take 1 tablet (81 mg total) by mouth daily. Swallow whole.   benzonatate (TESSALON) 100 MG capsule Take 1 capsule (100 mg total) by mouth 3 (three) times daily.   budesonide-formoterol (SYMBICORT) 80-4.5 MCG/ACT inhaler Inhale 2 puffs into the lungs 2 (two) times daily.   Cholecalciferol (VITAMIN D) 2000 units tablet Take 2,000 Units by mouth daily.    Continuous Blood Gluc Sensor (FREESTYLE LIBRE 14 DAY SENSOR) MISC  1 Package by Does not apply route every 14 (fourteen) days.   Cyanocobalamin (DODEX IJ) Inject as directed every 30 (thirty) days.   docusate sodium (COLACE) 100 MG capsule Take 1 capsule (100 mg total) by mouth 2 (two) times daily.   ezetimibe (ZETIA) 10 MG tablet Take 1 tablet (10 mg total) by mouth daily.   ferrous sulfate 324 MG TBEC Take 324 mg by mouth daily with breakfast.   furosemide (LASIX) 20 MG tablet TAKE 1 TABLET BY MOUTH EVERY DAY   gabapentin (NEURONTIN) 100 MG capsule Take 2 capsules (200 mg total) by mouth 3 (three) times daily.   glucagon 1 MG injection Inject 1 mg into the muscle once as needed for up to 1 dose. Hypoglycemia If she is not tolerating PO.   guaiFENesin (ROBITUSSIN) 100 MG/5ML liquid Take 10 mLs by mouth every 4 (four) hours as needed for cough or to loosen phlegm.   HYDROcodone-acetaminophen (NORCO) 10-325 MG tablet Take 1 tablet by mouth every 6 (six) hours as needed.   Insulin Glargine (LANTUS SOLOSTAR [place]) Inject 14 Units into the skin at bedtime.   insulin lispro (HUMALOG KWIKPEN) 100 UNIT/ML KwikPen Inject 8 Units into the skin 3 (three)  times daily. Max Daily 30 units   Insulin Pen Needle 32G X 4 MM MISC 1 Device by Does not apply route in the morning, at noon, in the evening, and at bedtime.   isosorbide mononitrate (IMDUR) 30 MG 24 hr tablet Take 1 tablet (30 mg total) by mouth daily.   levothyroxine (SYNTHROID) 150 MCG tablet Take 1 tablet (150 mcg total) by mouth as directed. 2 tabs on Sundays and 1 tablet the rest of the week   losartan (COZAAR) 50 MG tablet Take 1 tablet (50 mg total) by mouth daily.   metoprolol tartrate (LOPRESSOR) 25 MG tablet Take 25 mg by mouth. 1/2 tablet in AM and 1 tablet in the evening   Misc. Devices (BARIATRIC ROLLATOR) MISC Use as needed   Multiple Vitamins-Minerals (PRESERVISION AREDS PO) Take 1 tablet by mouth daily.   nitroGLYCERIN (NITROSTAT) 0.4 MG SL tablet Place 1 tablet (0.4 mg total) under the tongue every 5 (five)  minutes x 3 doses as needed for chest pain.   omeprazole (PRILOSEC) 40 MG capsule TAKE 1 CAPSULE TWICE A DAY   prochlorperazine (COMPAZINE) 10 MG tablet Take 10 mg by mouth every 4 (four) hours as needed.   THICK-IT POWD See admin instructions.   WIXELA INHUB 100-50 MCG/ACT AEPB Inhale 1 puff into the lungs 2 (two) times daily.    Allergies: Pravachol [pravastatin]  Social History   Tobacco Use   Smoking status: Never   Smokeless tobacco: Never  Vaping Use   Vaping Use: Never used  Substance Use Topics   Alcohol use: No   Drug use: No    Family History  Problem Relation Age of Onset   Diabetes Mother    Arthritis Mother    Hyperlipidemia Mother    Mental illness Mother    Heart disease Father    Mental illness Sister    Arthritis Maternal Grandmother    Arthritis Maternal Grandfather    Colon cancer Neg Hx    Stomach cancer Neg Hx    Esophageal cancer Neg Hx    Rectal cancer Neg Hx    Colon polyps Neg Hx     Review of Systems: A 12-system review of systems was performed and was negative except as noted in the HPI.  --------------------------------------------------------------------------------------------------  Physical Exam: BP 126/60 (BP Location: Right Arm, Patient Position: Sitting, Cuff Size: Normal)    Pulse 80    Ht 5\' 4"  (1.626 m)    Wt 132 lb (59.9 kg)    BMI 22.66 kg/m   General:  NAD. Neck: No JVD or HJR. Lungs: Clear to auscultation bilaterally without wheezes or crackles. Heart: Regular rate and rhythm with 2/6 systolic murmur. Abdomen: Soft, nontender, nondistended. Extremities: Trace pretibial edema bilaterally.  Lab Results  Component Value Date   WBC 12.9 (H) 12/29/2020   HGB 9.4 (L) 12/29/2020   HCT 28.7 (L) 12/29/2020   MCV 88.3 12/29/2020   PLT 320 12/29/2020    Lab Results  Component Value Date   NA 136 12/28/2020   K 3.8 12/28/2020   CL 97 (L) 12/28/2020   CO2 29 12/28/2020   BUN 40 (H) 12/28/2020   CREATININE 1.00  12/28/2020   GLUCOSE 186 (H) 12/28/2020   ALT 13 11/17/2020    Lab Results  Component Value Date   CHOL 134 02/13/2021   HDL 47 02/13/2021   LDLCALC 71 02/13/2021   TRIG 82 02/13/2021   CHOLHDL 2.9 02/13/2021    --------------------------------------------------------------------------------------------------  ASSESSMENT AND PLAN: Coronary  artery disease with stable angina: [name] has not had any further chest pain since her hospitalization last month with NSTEMI, which was medically managed.  Given her advanced age and lack of symptoms as well as ongoing hospice care, we have agreed to defer any further interventions.  Continue antianginal therapy with low-dose isosorbide mononitrate.  Due to some fatigue, we will discontinue metoprolol.  We discussed the risks and benefits of more aggressive lipid therapy, including the role of statins.  [name] and her daughter are concerned about potential side effects and limited benefit of statins given [name]'s age and comorbidities.  Given that her LDL was just slightly above goal at 71 on check last week, we will continue with ezetimibe monotherapy as well as secondary prevention with aspirin 81 mg daily.  Complete heart block: No further syncope noted.  Continue ongoing follow-up with Dr. Quentin Ore and the device clinic.  Chronic HFpEF: [name] appears euvolemic and no longer requires supplemental oxygen.  We will continue with low-dose furosemide.  Nelva Bush, MD 02/16/2021 4:35 PM

## 2021-02-13 ENCOUNTER — Other Ambulatory Visit: Payer: Self-pay

## 2021-02-13 ENCOUNTER — Other Ambulatory Visit (INDEPENDENT_AMBULATORY_CARE_PROVIDER_SITE_OTHER)

## 2021-02-13 DIAGNOSIS — E1169 Type 2 diabetes mellitus with other specified complication: Secondary | ICD-10-CM

## 2021-02-13 DIAGNOSIS — E785 Hyperlipidemia, unspecified: Secondary | ICD-10-CM | POA: Diagnosis not present

## 2021-02-14 LAB — LIPID PANEL
Chol/HDL Ratio: 2.9 ratio (ref 0.0–4.4)
Cholesterol, Total: 134 mg/dL (ref 100–199)
HDL: 47 mg/dL (ref >39)
LDL Chol Calc (NIH): 71 mg/dL (ref 0–99)
Triglycerides: 82 mg/dL (ref 0–149)
VLDL Cholesterol Cal: 16 mg/dL (ref 5–40)

## 2021-02-15 ENCOUNTER — Ambulatory Visit: Payer: Medicare HMO | Admitting: Family

## 2021-02-16 ENCOUNTER — Encounter: Payer: Self-pay | Admitting: Internal Medicine

## 2021-02-16 ENCOUNTER — Ambulatory Visit (INDEPENDENT_AMBULATORY_CARE_PROVIDER_SITE_OTHER): Admitting: Internal Medicine

## 2021-02-16 ENCOUNTER — Other Ambulatory Visit: Payer: Self-pay

## 2021-02-16 VITALS — BP 126/60 | HR 80 | Ht 64.0 in | Wt 132.0 lb

## 2021-02-16 DIAGNOSIS — I442 Atrioventricular block, complete: Secondary | ICD-10-CM | POA: Diagnosis not present

## 2021-02-16 DIAGNOSIS — I25118 Atherosclerotic heart disease of native coronary artery with other forms of angina pectoris: Secondary | ICD-10-CM | POA: Diagnosis not present

## 2021-02-16 DIAGNOSIS — Z950 Presence of cardiac pacemaker: Secondary | ICD-10-CM

## 2021-02-16 DIAGNOSIS — I5032 Chronic diastolic (congestive) heart failure: Secondary | ICD-10-CM

## 2021-02-16 NOTE — Patient Instructions (Signed)
Medication Instructions:  ? ?Your physician has recommended you make the following change in your medication:  ? ?STOP Metoprolol  ? ?*If you need a refill on your cardiac medications before your next appointment, please call your pharmacy* ? ? ?Lab Work: ? ?None ordered ? ?Testing/Procedures: ? ?None ordered ? ? ?Follow-Up: ?At [facility], you and your health needs are our priority.  As part of our continuing mission to provide you with exceptional heart care, we have created designated Provider Care Teams.  These Care Teams include your primary Cardiologist (physician) and Advanced Practice Providers (APPs -  Physician Assistants and Nurse Practitioners) who all work together to provide you with the care you need, when you need it. ? ?We recommend signing up for the patient portal called "MyChart".  Sign up information is provided on this After Visit Summary.  MyChart is used to connect with patients for Virtual Visits (Telemedicine).  Patients are able to view lab/test results, encounter notes, upcoming appointments, etc.  Non-urgent messages can be sent to your provider as well.   ?To learn more about what you can do with MyChart, go to [email or web address].   ? ?Your next appointment:   ?3 month(s) ? ?The format for your next appointment:   ?In Person ? ?Provider:   ?You may see Christopher End, MD or one of the following Advanced Practice Providers on your designated Care Team:   ?Christopher Berge, NP ?Ryan Dunn, PA-C ?Cadence Furth, PA-C ?

## 2021-02-17 ENCOUNTER — Other Ambulatory Visit: Payer: Self-pay | Admitting: Family

## 2021-02-17 DIAGNOSIS — I442 Atrioventricular block, complete: Secondary | ICD-10-CM

## 2021-02-17 DIAGNOSIS — I10 Essential (primary) hypertension: Secondary | ICD-10-CM

## 2021-02-17 NOTE — Telephone Encounter (Signed)
See pharmacy NOTE.

## 2021-02-18 ENCOUNTER — Encounter: Payer: Self-pay | Admitting: Internal Medicine

## 2021-02-24 ENCOUNTER — Other Ambulatory Visit: Payer: Self-pay | Admitting: Family

## 2021-02-24 DIAGNOSIS — I442 Atrioventricular block, complete: Secondary | ICD-10-CM

## 2021-02-24 DIAGNOSIS — I10 Essential (primary) hypertension: Secondary | ICD-10-CM

## 2021-03-12 ENCOUNTER — Other Ambulatory Visit: Payer: Self-pay | Admitting: Family

## 2021-03-27 ENCOUNTER — Other Ambulatory Visit: Payer: Medicare Other

## 2021-03-28 ENCOUNTER — Other Ambulatory Visit (INDEPENDENT_AMBULATORY_CARE_PROVIDER_SITE_OTHER)

## 2021-03-28 ENCOUNTER — Other Ambulatory Visit: Payer: Self-pay

## 2021-03-28 DIAGNOSIS — E039 Hypothyroidism, unspecified: Secondary | ICD-10-CM

## 2021-03-28 LAB — TSH: TSH: 3.98 u[IU]/mL (ref 0.35–5.50)

## 2021-04-12 ENCOUNTER — Other Ambulatory Visit: Payer: Self-pay

## 2021-04-12 ENCOUNTER — Encounter: Payer: Self-pay | Admitting: Internal Medicine

## 2021-04-12 ENCOUNTER — Ambulatory Visit (INDEPENDENT_AMBULATORY_CARE_PROVIDER_SITE_OTHER): Payer: Medicare HMO

## 2021-04-12 DIAGNOSIS — I442 Atrioventricular block, complete: Secondary | ICD-10-CM

## 2021-04-12 LAB — CUP PACEART REMOTE DEVICE CHECK
Battery Remaining Longevity: 119 mo
Battery Remaining Percentage: 95.5 %
Battery Voltage: 3.01 V
Brady Statistic AP VP Percent: 4 %
Brady Statistic AP VS Percent: 1 %
Brady Statistic AS VP Percent: 96 %
Brady Statistic AS VS Percent: 1 %
Brady Statistic RA Percent Paced: 3.9 %
Brady Statistic RV Percent Paced: 99 %
Date Time Interrogation Session: 20230329020016
Implantable Lead Implant Date: 20220928
Implantable Lead Implant Date: 20220928
Implantable Lead Location: 753859
Implantable Lead Location: 753860
Implantable Pulse Generator Implant Date: 20220928
Lead Channel Impedance Value: 460 Ohm
Lead Channel Impedance Value: 550 Ohm
Lead Channel Pacing Threshold Amplitude: 0.75 V
Lead Channel Pacing Threshold Amplitude: 1.125 V
Lead Channel Pacing Threshold Pulse Width: 0.4 ms
Lead Channel Pacing Threshold Pulse Width: 0.4 ms
Lead Channel Sensing Intrinsic Amplitude: 2.3 mV
Lead Channel Sensing Intrinsic Amplitude: 3.8 mV
Lead Channel Setting Pacing Amplitude: 1.375
Lead Channel Setting Pacing Amplitude: 1.75 V
Lead Channel Setting Pacing Pulse Width: 0.4 ms
Lead Channel Setting Sensing Sensitivity: 4 mV
Pulse Gen Model: 2272
Pulse Gen Serial Number: 3942268

## 2021-04-12 MED ORDER — LANTUS SOLOSTAR 100 UNIT/ML ~~LOC~~ SOPN: 14.0000 [IU] | PEN_INJECTOR | Freq: Every day | SUBCUTANEOUS | 2 refills | Status: DC

## 2021-04-13 ENCOUNTER — Ambulatory Visit: Payer: Medicare HMO | Admitting: Internal Medicine

## 2021-04-14 ENCOUNTER — Telehealth (INDEPENDENT_AMBULATORY_CARE_PROVIDER_SITE_OTHER): Admitting: Internal Medicine

## 2021-04-14 ENCOUNTER — Encounter: Payer: Self-pay | Admitting: Internal Medicine

## 2021-04-14 VITALS — BP 150/75 | HR 72

## 2021-04-14 DIAGNOSIS — E1122 Type 2 diabetes mellitus with diabetic chronic kidney disease: Secondary | ICD-10-CM

## 2021-04-14 DIAGNOSIS — E1142 Type 2 diabetes mellitus with diabetic polyneuropathy: Secondary | ICD-10-CM

## 2021-04-14 DIAGNOSIS — E1165 Type 2 diabetes mellitus with hyperglycemia: Secondary | ICD-10-CM

## 2021-04-14 DIAGNOSIS — Z794 Long term (current) use of insulin: Secondary | ICD-10-CM

## 2021-04-14 DIAGNOSIS — E039 Hypothyroidism, unspecified: Secondary | ICD-10-CM | POA: Diagnosis not present

## 2021-04-14 DIAGNOSIS — N1831 Chronic kidney disease, stage 3a: Secondary | ICD-10-CM

## 2021-04-14 MED ORDER — INSULIN LISPRO (1 UNIT DIAL) 100 UNIT/ML (KWIKPEN): 10.0000 [IU] | PEN_INJECTOR | Freq: Three times a day (TID) | SUBCUTANEOUS | 3 refills | Status: DC

## 2021-04-14 MED ORDER — LEVOTHYROXINE SODIUM 150 MCG PO TABS: 150.0000 ug | ORAL_TABLET | Freq: Every day | ORAL | 3 refills | Status: DC

## 2021-04-14 NOTE — Progress Notes (Signed)
Virtual Visit via Video Note ? ?I connected with Amy Calhoun on 04/14/21 at 3 pm ,by a video enabled telemedicine application and verified that I am speaking with the correct person using two identifiers. ?  ?I discussed the limitations of evaluation and management by telemedicine and the availability of in person appointments. The patient expressed understanding and agreed to proceed. ? ? ?-Location of the patient : Home ?-Location of the provider : Office ?-The names of all persons participating in the telemedicine service : Pt , daughter and myself  ? ? ? ? ? ? ? ? ?Name: Amy Calhoun  ?Age/ Sex: 88 y.o., female   ?MRN/ DOB: [id number], 1928/10/29    ? ?PCP: No primary care provider on file.   ?Reason for Endocrinology Evaluation: Type 2 Diabetes Mellitus  ?Initial Endocrine Consultative Visit: 04/15/2018  ? ? ?PATIENT IDENTIFIER: Amy Calhoun is a 88 y.o. female with a past medical history of T2DM, Dyslipidemia, Hypothyroidism , heart block S/P pacemaker and HTN . The patient has followed with Endocrinology clinic since 04/15/2018 for consultative assistance with management of her diabetes. ? ? ?Patient used to be cared for by her other daughter in [place] but daughter had passed due to a motorcycle accident and Amy Calhoun brought mother to [facility] in 2017. ? ? ?DIABETIC HISTORY:  ?[name] was diagnosed with DM at age 68. She has been on insulin since her diagnosis.She was briefly on glipizide but this was stopped in March, 2020. Pt did not recall any other oral glycemic agents. Her hemoglobin A1c has ranged from 6.5 % in 2018, peaking at 8.9%  In 2018. ? ?Daughter would like to explore putting mother on glycemic agents, she is concerned about having MDI regimen as mother gets older and worried about future dexterity.  ? ?On her initial visit to our clinic, she was on Humulin-N BID, Humulin-R per SS and Tradjenta. ? ?We stopped Tradjenta, started Trulicity in March, [id number], with reduction in insulin doses.   ? ?In 09/2018 Replaced Humulin-N with Lantus  ? ?SUBJECTIVE:  ? ?During the last visit (01/27/2020): A1c 8.6% no changes ? ? ? ? ?Today (04/14/2021): [name] is here with daughter Amy Calhoun for a follow up on diabetes .  She checks her blood sugars multiple times a day through the freestyle libre. The patient has not had hypoglycemic episodes since the last clinic visit. ? ?She continues to be under hospice care due to weight loss ? ? ? ?Her new PCP Alvester Chou, NP does home visits once a month ?She was seen by cardiology in February, 2023 ? ?Daughter tells me fasting BG's are typically between 110-140 mg/DL, she continues to hold insulin if BG is" normal" for example  ? ?Patient supposed to be on 2 tablets of levothyroxine on Saturdays and 1 tablet the rest of the week but today she tells me she did that for 2 weeks and now patient is on 1 tablet daily  ? ?HOME DIABETES REGIMEN:  ?Lantus 14 units daily  ?Humalog 8 units TID QAC ?CF: Novolog ( BG- 130/60) ?Levothyroxine 150 mcg daily ? ? ?CONTINUOUS GLUCOSE MONITORING RECORD INTERPRETATION  and able to download ? ? ? ?HISTORY:  ?Past Medical History:  ?Past Medical History:  ?Diagnosis Date  ? Allergy   ? Ambulates with cane   ? Anemia   ? At high risk for falls   ? Cataract   ? removed both eyes  ? Chronic kidney disease 08/07/2016  ? Chronic  renal failure, stage III  ? Coronary artery disease   ? Severe, diffuse RCA disease by cath (09/2020).  NSTEMI (12/2020) - medical therapy recommended  ? Depression   ? Esophageal dysphagia   ? Gastric ulcer   ? GERD (gastroesophageal reflux disease)   ? Hyperlipidemia   ? Hypertension   ? Hypothyroidism   ? IDDM (insulin dependent diabetes mellitus)   ? Ischemic colitis ([place])   ? Neuromuscular disorder ([place])   ? neuropathy  ? Osteoarthritis   ? Ulcer of esophagus   ? ?Past Surgical History:  ?Past Surgical History:  ?Procedure Laterality Date  ? ABDOMINAL HYSTERECTOMY    ? CATARACT EXTRACTION, BILATERAL    ? CHOLECYSTECTOMY    ?  COLONOSCOPY  2010  ? ESOPHAGEAL DILATION  08/07/2016  ? usually a couple times a year, last time 07/30/16  ? GALLBLADDER SURGERY    ? LEFT HEART CATH AND CORONARY ANGIOGRAPHY N/A 10/11/2020  ? Procedure: LEFT HEART CATH AND CORONARY ANGIOGRAPHY;  Surgeon: Nelva Bush, MD;  Location: [place] [facility];  Service: Cardiovascular;  Laterality: N/A;  ? LUMBAR LAMINECTOMY/DECOMPRESSION MICRODISCECTOMY N/A 08/10/2016  ? Procedure: BILATERAL HEMILAMINECTOMY LUMBAR THREE-FOUR,LUMBAR FOUR-FIVE,LEFT LUMBAR FIVE-SACRAL ONE HEMILAMINECTOMY AND DECOMPRESSION;  Surgeon: Ashok Pall, MD;  Location: [place];  Service: Neurosurgery;  Laterality: N/A;  ? PACEMAKER IMPLANT N/A 10/12/2020  ? Procedure: PACEMAKER IMPLANT;  Surgeon: Vickie Epley, MD;  Location: [place] [facility];  Service: Cardiovascular;  Laterality: N/A;  ? TEMPORARY PACEMAKER N/A 10/11/2020  ? Procedure: TEMPORARY PACEMAKER;  Surgeon: Nelva Bush, MD;  Location: [place] [facility];  Service: Cardiovascular;  Laterality: N/A;  ? THYROIDECTOMY    ? UPPER GASTROINTESTINAL ENDOSCOPY    ? ?Social History:  reports that she has never smoked. She has never used smokeless tobacco. She reports that she does not drink alcohol and does not use drugs. ?Family History:  ?Family History  ?Problem Relation Age of Onset  ? Diabetes Mother   ? Arthritis Mother   ? Hyperlipidemia Mother   ? Mental illness Mother   ? Heart disease Father   ? Mental illness Sister   ? Arthritis Maternal Grandmother   ? Arthritis Maternal Grandfather   ? Colon cancer Neg Hx   ? Stomach cancer Neg Hx   ? Esophageal cancer Neg Hx   ? Rectal cancer Neg Hx   ? Colon polyps Neg Hx   ? ? ? ?HOME MEDICATIONS: ?Allergies as of 04/14/2021   ? ?   Reactions  ? Pravachol [pravastatin] Other (See Comments)  ? States she refused due to side effects  ? ?  ? ?  ?Medication List  ?  ? ?  ? Accurate as of April 14, 2021 10:30 AM. If you have any questions, ask your nurse or doctor.  ?  ?  ? ?   ? ?Acetaminophen-Codeine 300-30 MG tablet ?Take 1 tablet by mouth every 12 (twelve) hours as needed for pain (moderate to severe pain). ?  ?albuterol (2.5 MG/3ML) 0.083% nebulizer solution ?Commonly known as: PROVENTIL ?Take 3 mLs (2.5 mg total) by nebulization every 6 (six) hours as needed for wheezing or shortness of breath. ?  ?albuterol 108 (90 Base) MCG/ACT inhaler ?Commonly known as: VENTOLIN HFA ?Inhale 2 puffs into the lungs every 6 (six) hours as needed for wheezing or shortness of breath. ?  ?amitriptyline 100 MG tablet ?Commonly known as: ELAVIL ?Take 1 tablet (100 mg total) by mouth at bedtime. ?  ?ascorbic acid 500  MG tablet ?Commonly known as: VITAMIN C ?Take 500 mg by mouth daily. ?  ?aspirin 81 MG EC tablet ?Take 1 tablet (81 mg total) by mouth daily. Swallow whole. ?  ?Bariatric Rollator Misc ?Use as needed ?  ?benzonatate 100 MG capsule ?Commonly known as: TESSALON ?Take 1 capsule (100 mg total) by mouth 3 (three) times daily. ?  ?budesonide-formoterol 80-4.5 MCG/ACT inhaler ?Commonly known as: SYMBICORT ?Inhale 2 puffs into the lungs 2 (two) times daily. ?  ?docusate sodium 100 MG capsule ?Commonly known as: COLACE ?Take 1 capsule (100 mg total) by mouth 2 (two) times daily. ?  ?DODEX IJ ?Inject as directed every 30 (thirty) days. ?  ?ezetimibe 10 MG tablet ?Commonly known as: ZETIA ?Take 1 tablet (10 mg total) by mouth daily. ?  ?ferrous sulfate 324 MG [place] ?Take 324 mg by mouth daily with breakfast. ?  ?FreeStyle Libre 14 Day Sensor Misc ?1 Package by Does not apply route every 14 (fourteen) days. ?  ?furosemide 20 MG tablet ?Commonly known as: LASIX ?TAKE 1/2 TABLET BY MOUTH DAILY ?  ?gabapentin 100 MG capsule ?Commonly known as: NEURONTIN ?Take 2 capsules (200 mg total) by mouth 3 (three) times daily. ?  ?glucagon 1 MG injection ?Inject 1 mg into the muscle once as needed for up to 1 dose. Hypoglycemia If she is not tolerating PO. ?  ?guaiFENesin 100 MG/5ML liquid ?Commonly known as:  ROBITUSSIN ?Take 10 mLs by mouth every 4 (four) hours as needed for cough or to loosen phlegm. ?  ?HYDROcodone-acetaminophen 10-325 MG tablet ?Commonly known as: NORCO ?Take 1 tablet by mouth every 6 (six) hours as needed. ?

## 2021-04-25 NOTE — Progress Notes (Signed)
Remote pacemaker transmission.   

## 2021-05-09 ENCOUNTER — Telehealth: Payer: Self-pay | Admitting: Internal Medicine

## 2021-05-09 NOTE — Telephone Encounter (Signed)
?*  STAT* If patient is at the pharmacy, call can be transferred to refill team. ? ? ?1. Which medications need to be refilled? (please list name of each medication and dose if known) isosorbide mononitrate (IMDUR) 30 MG 24 hr tablet and losartan (COZAAR) 50 MG tablet ? ?2. Which pharmacy/location (including street and city if local pharmacy) is medication to be sent to? [id number] [place], [place] - [address] ? ?3. Do they need a 30 day or 90 day supply?  ? ? ?Patient's PCP not refilling anymore, Lynn with [facility] is calling to get these medications refilled by Dr. Okey Dupre. She states that they are covered for 14 days under hospice care.  ?

## 2021-05-10 MED ORDER — ISOSORBIDE MONONITRATE ER 30 MG PO TB24: 30.0000 mg | ORAL_TABLET | Freq: Every day | ORAL | 0 refills | Status: DC

## 2021-05-10 MED ORDER — LOSARTAN POTASSIUM 50 MG PO TABS
50.0000 mg | ORAL_TABLET | Freq: Every day | ORAL | 0 refills | Status: DC
Start: 2021-05-10 — End: 2021-05-12

## 2021-05-10 NOTE — Telephone Encounter (Signed)
Please advise if ok to refill requested medication per Hospice. ?Last filled by Enedina Finner, MD ?

## 2021-05-12 ENCOUNTER — Telehealth: Payer: Self-pay | Admitting: Cardiology

## 2021-05-12 DIAGNOSIS — I442 Atrioventricular block, complete: Secondary | ICD-10-CM

## 2021-05-12 DIAGNOSIS — I10 Essential (primary) hypertension: Secondary | ICD-10-CM

## 2021-05-12 DIAGNOSIS — E785 Hyperlipidemia, unspecified: Secondary | ICD-10-CM

## 2021-05-12 MED ORDER — ISOSORBIDE MONONITRATE ER 30 MG PO TB24: 30.0000 mg | ORAL_TABLET | Freq: Every day | ORAL | 0 refills | Status: DC

## 2021-05-12 MED ORDER — FUROSEMIDE 20 MG PO TABS: 10.0000 mg | ORAL_TABLET | Freq: Every day | ORAL | 0 refills | Status: DC

## 2021-05-12 MED ORDER — EZETIMIBE 10 MG PO TABS: 10.0000 mg | ORAL_TABLET | Freq: Every day | ORAL | 0 refills | Status: DC

## 2021-05-12 MED ORDER — LOSARTAN POTASSIUM 50 MG PO TABS: 50.0000 mg | ORAL_TABLET | Freq: Every day | ORAL | 0 refills | Status: DC

## 2021-05-12 NOTE — Telephone Encounter (Signed)
?*  STAT* If patient is at the pharmacy, call can be transferred to refill team. ? ? ?1. Which medications need to be refilled? (please list name of each medication and dose if known) ezetimibe (ZETIA) 10 MG tablet ? ?furosemide (LASIX) 20 MG tablet ? ?isosorbide mononitrate (IMDUR) 30 MG 24 hr tablet ? ?losartan (COZAAR) 50 MG tablet ? ?2. Which pharmacy/location (including street and city if local pharmacy) is medication to be sent to? [id number] [place], [place] - [address] ? ?3. Do they need a 30 day or 90 day supply? 90 ? ?

## 2021-05-12 NOTE — Telephone Encounter (Signed)
Requested Prescriptions  ? ?Signed Prescriptions Disp Refills  ? losartan (COZAAR) 50 MG tablet 90 tablet 0  ?  Sig: Take 1 tablet (50 mg total) by mouth daily.  ?  Authorizing Provider: END, CHRISTOPHER  ?  Ordering User: NEWCOMER MCCLAIN, Anessia Oakland L  ? isosorbide mononitrate (IMDUR) 30 MG 24 hr tablet 90 tablet 0  ?  Sig: Take 1 tablet (30 mg total) by mouth daily.  ?  Authorizing Provider: END, CHRISTOPHER  ?  Ordering User: NEWCOMER MCCLAIN, Briel Gallicchio L  ? ezetimibe (ZETIA) 10 MG tablet 90 tablet 0  ?  Sig: Take 1 tablet (10 mg total) by mouth daily.  ?  Authorizing Provider: END, CHRISTOPHER  ?  Ordering User: NEWCOMER MCCLAIN, Marshal Schrecengost L  ? furosemide (LASIX) 20 MG tablet 45 tablet 0  ?  Sig: Take 0.5 tablets (10 mg total) by mouth daily.  ?  Authorizing Provider: END, CHRISTOPHER  ?  Ordering User: NEWCOMER MCCLAIN, Dimitrious Micciche L  ? ? ?

## 2021-05-23 ENCOUNTER — Encounter
Admission: EM | Disposition: A | Discharge status: Home / Self Care | Payer: Self-pay | Source: Home / Self Care | Attending: Emergency Medicine

## 2021-05-23 ENCOUNTER — Emergency Department: Payer: Medicare Other | Admitting: Certified Registered Nurse Anesthetist

## 2021-05-23 ENCOUNTER — Emergency Department: Payer: Medicare Other

## 2021-05-23 ENCOUNTER — Other Ambulatory Visit: Payer: Self-pay

## 2021-05-23 ENCOUNTER — Inpatient Hospital Stay
Admission: EM | Admit: 2021-05-23 | Discharge: 2021-05-29 | DRG: 177 | Disposition: A | Discharge status: Hospice | Payer: Medicare Other | Attending: Osteopathic Medicine | Admitting: Osteopathic Medicine

## 2021-05-23 ENCOUNTER — Ambulatory Visit (EMERGENCY_DEPARTMENT_HOSPITAL)
Admission: EM | Admit: 2021-05-23 | Discharge: 2021-05-23 | Disposition: A | Discharge status: Home / Self Care | Payer: Medicare Other | Source: Home / Self Care | Attending: Emergency Medicine | Admitting: Emergency Medicine

## 2021-05-23 DIAGNOSIS — E1122 Type 2 diabetes mellitus with diabetic chronic kidney disease: Secondary | ICD-10-CM | POA: Diagnosis present

## 2021-05-23 DIAGNOSIS — T18108A Unspecified foreign body in esophagus causing other injury, initial encounter: Secondary | ICD-10-CM

## 2021-05-23 DIAGNOSIS — R1310 Dysphagia, unspecified: Secondary | ICD-10-CM | POA: Diagnosis present

## 2021-05-23 DIAGNOSIS — K222 Esophageal obstruction: Secondary | ICD-10-CM | POA: Diagnosis present

## 2021-05-23 DIAGNOSIS — I252 Old myocardial infarction: Secondary | ICD-10-CM

## 2021-05-23 DIAGNOSIS — J449 Chronic obstructive pulmonary disease, unspecified: Secondary | ICD-10-CM | POA: Diagnosis present

## 2021-05-23 DIAGNOSIS — J9621 Acute and chronic respiratory failure with hypoxia: Secondary | ICD-10-CM | POA: Diagnosis present

## 2021-05-23 DIAGNOSIS — E870 Hyperosmolality and hypernatremia: Secondary | ICD-10-CM | POA: Diagnosis present

## 2021-05-23 DIAGNOSIS — I129 Hypertensive chronic kidney disease with stage 1 through stage 4 chronic kidney disease, or unspecified chronic kidney disease: Secondary | ICD-10-CM | POA: Diagnosis present

## 2021-05-23 DIAGNOSIS — I2510 Atherosclerotic heart disease of native coronary artery without angina pectoris: Secondary | ICD-10-CM | POA: Insufficient documentation

## 2021-05-23 DIAGNOSIS — K219 Gastro-esophageal reflux disease without esophagitis: Secondary | ICD-10-CM | POA: Diagnosis present

## 2021-05-23 DIAGNOSIS — I447 Left bundle-branch block, unspecified: Secondary | ICD-10-CM | POA: Diagnosis present

## 2021-05-23 DIAGNOSIS — E039 Hypothyroidism, unspecified: Secondary | ICD-10-CM | POA: Diagnosis present

## 2021-05-23 DIAGNOSIS — Z66 Do not resuscitate: Secondary | ICD-10-CM | POA: Diagnosis present

## 2021-05-23 DIAGNOSIS — N183 Chronic kidney disease, stage 3 unspecified: Secondary | ICD-10-CM | POA: Insufficient documentation

## 2021-05-23 DIAGNOSIS — K224 Dyskinesia of esophagus: Secondary | ICD-10-CM | POA: Diagnosis present

## 2021-05-23 DIAGNOSIS — Z20822 Contact with and (suspected) exposure to covid-19: Secondary | ICD-10-CM | POA: Diagnosis present

## 2021-05-23 DIAGNOSIS — I509 Heart failure, unspecified: Secondary | ICD-10-CM | POA: Insufficient documentation

## 2021-05-23 DIAGNOSIS — F32A Depression, unspecified: Secondary | ICD-10-CM | POA: Diagnosis present

## 2021-05-23 DIAGNOSIS — Z9841 Cataract extraction status, right eye: Secondary | ICD-10-CM

## 2021-05-23 DIAGNOSIS — I25810 Atherosclerosis of coronary artery bypass graft(s) without angina pectoris: Secondary | ICD-10-CM | POA: Diagnosis not present

## 2021-05-23 DIAGNOSIS — E1065 Type 1 diabetes mellitus with hyperglycemia: Secondary | ICD-10-CM | POA: Insufficient documentation

## 2021-05-23 DIAGNOSIS — R5381 Other malaise: Secondary | ICD-10-CM | POA: Diagnosis present

## 2021-05-23 DIAGNOSIS — Z8249 Family history of ischemic heart disease and other diseases of the circulatory system: Secondary | ICD-10-CM

## 2021-05-23 DIAGNOSIS — I5032 Chronic diastolic (congestive) heart failure: Secondary | ICD-10-CM | POA: Diagnosis present

## 2021-05-23 DIAGNOSIS — Z833 Family history of diabetes mellitus: Secondary | ICD-10-CM

## 2021-05-23 DIAGNOSIS — R935 Abnormal findings on diagnostic imaging of other abdominal regions, including retroperitoneum: Secondary | ICD-10-CM | POA: Diagnosis not present

## 2021-05-23 DIAGNOSIS — E1165 Type 2 diabetes mellitus with hyperglycemia: Secondary | ICD-10-CM | POA: Diagnosis present

## 2021-05-23 DIAGNOSIS — I10 Essential (primary) hypertension: Secondary | ICD-10-CM | POA: Diagnosis present

## 2021-05-23 DIAGNOSIS — Z794 Long term (current) use of insulin: Secondary | ICD-10-CM | POA: Insufficient documentation

## 2021-05-23 DIAGNOSIS — I130 Hypertensive heart and chronic kidney disease with heart failure and stage 1 through stage 4 chronic kidney disease, or unspecified chronic kidney disease: Secondary | ICD-10-CM | POA: Insufficient documentation

## 2021-05-23 DIAGNOSIS — E1142 Type 2 diabetes mellitus with diabetic polyneuropathy: Secondary | ICD-10-CM | POA: Diagnosis present

## 2021-05-23 DIAGNOSIS — R339 Retention of urine, unspecified: Secondary | ICD-10-CM | POA: Diagnosis present

## 2021-05-23 DIAGNOSIS — N189 Chronic kidney disease, unspecified: Secondary | ICD-10-CM

## 2021-05-23 DIAGNOSIS — N1831 Chronic kidney disease, stage 3a: Secondary | ICD-10-CM | POA: Diagnosis present

## 2021-05-23 DIAGNOSIS — Z888 Allergy status to other drugs, medicaments and biological substances status: Secondary | ICD-10-CM | POA: Diagnosis not present

## 2021-05-23 DIAGNOSIS — J690 Pneumonitis due to inhalation of food and vomit: Secondary | ICD-10-CM | POA: Diagnosis present

## 2021-05-23 DIAGNOSIS — R739 Hyperglycemia, unspecified: Secondary | ICD-10-CM

## 2021-05-23 DIAGNOSIS — Z9842 Cataract extraction status, left eye: Secondary | ICD-10-CM

## 2021-05-23 DIAGNOSIS — Z950 Presence of cardiac pacemaker: Secondary | ICD-10-CM

## 2021-05-23 DIAGNOSIS — E1022 Type 1 diabetes mellitus with diabetic chronic kidney disease: Secondary | ICD-10-CM | POA: Insufficient documentation

## 2021-05-23 DIAGNOSIS — E785 Hyperlipidemia, unspecified: Secondary | ICD-10-CM | POA: Diagnosis present

## 2021-05-23 DIAGNOSIS — Z90710 Acquired absence of both cervix and uterus: Secondary | ICD-10-CM

## 2021-05-23 DIAGNOSIS — N179 Acute kidney failure, unspecified: Secondary | ICD-10-CM | POA: Diagnosis present

## 2021-05-23 DIAGNOSIS — Z9981 Dependence on supplemental oxygen: Secondary | ICD-10-CM | POA: Diagnosis not present

## 2021-05-23 DIAGNOSIS — Z8261 Family history of arthritis: Secondary | ICD-10-CM

## 2021-05-23 DIAGNOSIS — Z83438 Family history of other disorder of lipoprotein metabolism and other lipidemia: Secondary | ICD-10-CM

## 2021-05-23 DIAGNOSIS — Z9049 Acquired absence of other specified parts of digestive tract: Secondary | ICD-10-CM

## 2021-05-23 DIAGNOSIS — Z818 Family history of other mental and behavioral disorders: Secondary | ICD-10-CM

## 2021-05-23 HISTORY — PX: ESOPHAGOGASTRODUODENOSCOPY (EGD) WITH PROPOFOL: SHX5813

## 2021-05-23 LAB — CBC WITH DIFFERENTIAL/PLATELET
Abs Immature Granulocytes: 0 10*3/uL (ref 0.00–0.07)
Basophils Absolute: 0 10*3/uL (ref 0.0–0.1)
Basophils Relative: 0 %
Eosinophils Absolute: 0 10*3/uL (ref 0.0–0.5)
Eosinophils Relative: 0 %
HCT: 34.7 % — ABNORMAL LOW (ref 36.0–46.0)
Hemoglobin: 10.9 g/dL — ABNORMAL LOW (ref 12.0–15.0)
Immature Granulocytes: 0 %
Lymphocytes Relative: 12 %
Lymphs Abs: 0.5 10*3/uL — ABNORMAL LOW (ref 0.7–4.0)
MCH: 28.2 pg (ref 26.0–34.0)
MCHC: 31.4 g/dL (ref 30.0–36.0)
MCV: 89.9 fL (ref 80.0–100.0)
Monocytes Absolute: 0.4 10*3/uL (ref 0.1–1.0)
Monocytes Relative: 11 %
Neutro Abs: 3.1 10*3/uL (ref 1.7–7.7)
Neutrophils Relative %: 77 %
Platelets: 161 10*3/uL (ref 150–400)
RBC: 3.86 MIL/uL — ABNORMAL LOW (ref 3.87–5.11)
RDW: 13.3 % (ref 11.5–15.5)
WBC: 4 10*3/uL (ref 4.0–10.5)
nRBC: 0 % (ref 0.0–0.2)

## 2021-05-23 LAB — COMPREHENSIVE METABOLIC PANEL
ALT: 17 U/L (ref 0–44)
AST: 27 U/L (ref 15–41)
Albumin: 4.3 g/dL (ref 3.5–5.0)
Alkaline Phosphatase: 72 U/L (ref 38–126)
Anion gap: 10 (ref 5–15)
BUN: 44 mg/dL — ABNORMAL HIGH (ref 8–23)
CO2: 25 mmol/L (ref 22–32)
Calcium: 8.8 mg/dL — ABNORMAL LOW (ref 8.9–10.3)
Chloride: 101 mmol/L (ref 98–111)
Creatinine, Ser: 1.38 mg/dL — ABNORMAL HIGH (ref 0.44–1.00)
GFR, Estimated: 36 mL/min — ABNORMAL LOW (ref >60)
Glucose, Bld: 354 mg/dL — ABNORMAL HIGH (ref 70–99)
Potassium: 5 mmol/L (ref 3.5–5.1)
Sodium: 136 mmol/L (ref 135–145)
Total Bilirubin: 0.8 mg/dL (ref 0.3–1.2)
Total Protein: 8.2 g/dL — ABNORMAL HIGH (ref 6.5–8.1)

## 2021-05-23 LAB — CBG MONITORING, ED
Glucose-Capillary: 296 mg/dL — ABNORMAL HIGH (ref 70–99)
Glucose-Capillary: 309 mg/dL — ABNORMAL HIGH (ref 70–99)
Glucose-Capillary: 352 mg/dL — ABNORMAL HIGH (ref 70–99)
Glucose-Capillary: 373 mg/dL — ABNORMAL HIGH (ref 70–99)

## 2021-05-23 LAB — CBC
HCT: 39.2 % (ref 36.0–46.0)
Hemoglobin: 12.5 g/dL (ref 12.0–15.0)
MCH: 28.2 pg (ref 26.0–34.0)
MCHC: 31.9 g/dL (ref 30.0–36.0)
MCV: 88.5 fL (ref 80.0–100.0)
Platelets: 224 10*3/uL (ref 150–400)
RBC: 4.43 MIL/uL (ref 3.87–5.11)
RDW: 13.2 % (ref 11.5–15.5)
WBC: 8.6 10*3/uL (ref 4.0–10.5)
nRBC: 0 % (ref 0.0–0.2)

## 2021-05-23 LAB — LIPASE, BLOOD: Lipase: 24 U/L (ref 11–51)

## 2021-05-23 LAB — BASIC METABOLIC PANEL
Anion gap: 8 (ref 5–15)
BUN: 49 mg/dL — ABNORMAL HIGH (ref 8–23)
CO2: 23 mmol/L (ref 22–32)
Calcium: 7.8 mg/dL — ABNORMAL LOW (ref 8.9–10.3)
Chloride: 106 mmol/L (ref 98–111)
Creatinine, Ser: 1.56 mg/dL — ABNORMAL HIGH (ref 0.44–1.00)
GFR, Estimated: 31 mL/min — ABNORMAL LOW (ref >60)
Glucose, Bld: 280 mg/dL — ABNORMAL HIGH (ref 70–99)
Potassium: 3.8 mmol/L (ref 3.5–5.1)
Sodium: 137 mmol/L (ref 135–145)

## 2021-05-23 SURGERY — ESOPHAGOGASTRODUODENOSCOPY (EGD) WITH PROPOFOL
Anesthesia: General

## 2021-05-23 SURGERY — EGD (ESOPHAGOGASTRODUODENOSCOPY)
Anesthesia: General

## 2021-05-23 MED ORDER — INSULIN REGULAR HUMAN 100 UNIT/ML IJ SOLN
7.0000 [IU] | Freq: Once | INTRAMUSCULAR | Status: AC
  Administered 2021-05-23: 7 [IU] via SUBCUTANEOUS

## 2021-05-23 MED ORDER — LOSARTAN POTASSIUM 50 MG PO TABS: 50.0000 mg | ORAL_TABLET | Freq: Every day | ORAL | Status: DC

## 2021-05-23 MED ORDER — IPRATROPIUM-ALBUTEROL 0.5-2.5 (3) MG/3ML IN SOLN
3.0000 mL | Freq: Four times a day (QID) | RESPIRATORY_TRACT | Status: DC
Start: 2021-05-23 — End: 2021-05-24
  Administered 2021-05-23 – 2021-05-24 (×2): 3 mL via RESPIRATORY_TRACT
  Filled 2021-05-23 (×2): qty 3

## 2021-05-23 MED ORDER — ASPIRIN EC 81 MG PO TBEC
81.0000 mg | DELAYED_RELEASE_TABLET | Freq: Every day | ORAL | Status: DC
  Administered 2021-05-24 – 2021-05-29 (×6): 81 mg via ORAL
  Filled 2021-05-23 (×6): qty 1

## 2021-05-23 MED ORDER — CEFTRIAXONE SODIUM 2 G IJ SOLR
2.0000 g | Freq: Once | INTRAMUSCULAR | Status: AC
Start: 2021-05-23 — End: 2021-05-23
  Administered 2021-05-23: 2 g via INTRAVENOUS
  Filled 2021-05-23: qty 20

## 2021-05-23 MED ORDER — ACETAMINOPHEN 325 MG PO TABS
650.0000 mg | ORAL_TABLET | Freq: Four times a day (QID) | ORAL | Status: DC | PRN
  Administered 2021-05-24: 650 mg via ORAL
  Filled 2021-05-23: qty 2

## 2021-05-23 MED ORDER — PROPOFOL 10 MG/ML IV BOLUS
INTRAVENOUS | Status: DC | PRN
  Administered 2021-05-23: 30 mg via INTRAVENOUS
  Administered 2021-05-23: 20 mg via INTRAVENOUS

## 2021-05-23 MED ORDER — EZETIMIBE 10 MG PO TABS
10.0000 mg | ORAL_TABLET | Freq: Every day | ORAL | Status: DC
  Administered 2021-05-24 – 2021-05-29 (×6): 10 mg via ORAL
  Filled 2021-05-23 (×6): qty 1

## 2021-05-23 MED ORDER — ONDANSETRON HCL 4 MG/2ML IJ SOLN
4.0000 mg | Freq: Four times a day (QID) | INTRAMUSCULAR | Status: DC | PRN
  Administered 2021-05-24: 4 mg via INTRAVENOUS
  Filled 2021-05-23: qty 2

## 2021-05-23 MED ORDER — LIDOCAINE HCL (CARDIAC) PF 100 MG/5ML IV SOSY
PREFILLED_SYRINGE | INTRAVENOUS | Status: DC | PRN
Start: 2021-05-23 — End: 2021-05-23
  Administered 2021-05-23: 80 mg via INTRAVENOUS

## 2021-05-23 MED ORDER — GLUCAGON HCL RDNA (DIAGNOSTIC) 1 MG IJ SOLR
1.0000 mg | Freq: Once | INTRAMUSCULAR | Status: AC
Start: 2021-05-23 — End: 2021-05-23
  Administered 2021-05-23: 1 mg via INTRAVENOUS
  Filled 2021-05-23: qty 1

## 2021-05-23 MED ORDER — LEVOTHYROXINE SODIUM 50 MCG PO TABS
150.0000 ug | ORAL_TABLET | Freq: Every day | ORAL | Status: DC
  Administered 2021-05-24 – 2021-05-29 (×6): 150 ug via ORAL
  Filled 2021-05-23 (×6): qty 1

## 2021-05-23 MED ORDER — SODIUM CHLORIDE 0.9 % IV SOLN: INTRAVENOUS | Status: DC

## 2021-05-23 MED ORDER — ALBUTEROL SULFATE (2.5 MG/3ML) 0.083% IN NEBU
2.5000 mg | INHALATION_SOLUTION | Freq: Once | RESPIRATORY_TRACT | Status: AC
  Administered 2021-05-23: 2.5 mg via RESPIRATORY_TRACT
  Filled 2021-05-23: qty 3

## 2021-05-23 MED ORDER — SODIUM CHLORIDE 0.9 % IV SOLN: 500.0000 mg | INTRAVENOUS | Status: DC

## 2021-05-23 MED ORDER — FENTANYL CITRATE (PF) 100 MCG/2ML IJ SOLN
INTRAMUSCULAR | Status: AC
Start: 2021-05-23 — End: ?
  Filled 2021-05-23: qty 2

## 2021-05-23 MED ORDER — PANTOPRAZOLE SODIUM 40 MG PO TBEC
40.0000 mg | DELAYED_RELEASE_TABLET | Freq: Every day | ORAL | Status: DC
  Administered 2021-05-24 – 2021-05-29 (×6): 40 mg via ORAL
  Filled 2021-05-23 (×7): qty 1

## 2021-05-23 MED ORDER — ISOSORBIDE MONONITRATE ER 30 MG PO TB24
30.0000 mg | ORAL_TABLET | Freq: Every day | ORAL | Status: DC
  Administered 2021-05-24 – 2021-05-29 (×6): 30 mg via ORAL
  Filled 2021-05-23 (×6): qty 1

## 2021-05-23 MED ORDER — FUROSEMIDE 20 MG PO TABS
10.0000 mg | ORAL_TABLET | Freq: Every day | ORAL | Status: DC
  Filled 2021-05-23: qty 0.5

## 2021-05-23 MED ORDER — METRONIDAZOLE 500 MG/100ML IV SOLN
500.0000 mg | Freq: Once | INTRAVENOUS | Status: AC
Start: 2021-05-23 — End: 2021-05-23
  Administered 2021-05-23: 500 mg via INTRAVENOUS
  Filled 2021-05-23: qty 100

## 2021-05-23 MED ORDER — TRAZODONE HCL 50 MG PO TABS
25.0000 mg | ORAL_TABLET | Freq: Every evening | ORAL | Status: DC | PRN
  Administered 2021-05-26 (×2): 25 mg via ORAL
  Filled 2021-05-23 (×2): qty 1

## 2021-05-23 MED ORDER — AZITHROMYCIN 500 MG IV SOLR
500.0000 mg | Freq: Once | INTRAVENOUS | Status: AC
  Administered 2021-05-23: 500 mg via INTRAVENOUS
  Filled 2021-05-23: qty 5

## 2021-05-23 MED ORDER — MAGNESIUM HYDROXIDE 400 MG/5ML PO SUSP
30.0000 mL | Freq: Every day | ORAL | Status: DC | PRN
  Administered 2021-05-27: 30 mL via ORAL
  Filled 2021-05-23: qty 30

## 2021-05-23 MED ORDER — ACETAMINOPHEN 650 MG RE SUPP: 650.0000 mg | Freq: Four times a day (QID) | RECTAL | Status: DC | PRN

## 2021-05-23 MED ORDER — GABAPENTIN 100 MG PO CAPS
200.0000 mg | ORAL_CAPSULE | Freq: Three times a day (TID) | ORAL | Status: DC
  Administered 2021-05-23 – 2021-05-29 (×18): 200 mg via ORAL
  Filled 2021-05-23 (×18): qty 2

## 2021-05-23 MED ORDER — ONDANSETRON HCL 4 MG PO TABS: 4.0000 mg | ORAL_TABLET | Freq: Four times a day (QID) | ORAL | Status: DC | PRN

## 2021-05-23 MED ORDER — ONDANSETRON HCL 4 MG/2ML IJ SOLN
INTRAMUSCULAR | Status: DC | PRN
  Administered 2021-05-23: 4 mg via INTRAVENOUS

## 2021-05-23 MED ORDER — INSULIN ASPART 100 UNIT/ML IJ SOLN
10.0000 [IU] | Freq: Once | INTRAMUSCULAR | Status: AC
Start: 2021-05-23 — End: 2021-05-23
  Administered 2021-05-23: 10 [IU] via SUBCUTANEOUS
  Filled 2021-05-23: qty 1

## 2021-05-23 MED ORDER — ASCORBIC ACID 500 MG PO TABS
500.0000 mg | ORAL_TABLET | Freq: Every day | ORAL | Status: DC
  Administered 2021-05-25 – 2021-05-29 (×5): 500 mg via ORAL
  Filled 2021-05-23 (×5): qty 1

## 2021-05-23 MED ORDER — SUCCINYLCHOLINE CHLORIDE 200 MG/10ML IV SOSY
PREFILLED_SYRINGE | INTRAVENOUS | Status: AC
  Filled 2021-05-23: qty 10

## 2021-05-23 MED ORDER — INSULIN ASPART 100 UNIT/ML IJ SOLN
INTRAMUSCULAR | Status: AC
  Filled 2021-05-23: qty 1

## 2021-05-23 MED ORDER — INSULIN GLARGINE-YFGN 100 UNIT/ML ~~LOC~~ SOLN
14.0000 [IU] | Freq: Every day | SUBCUTANEOUS | Status: DC
Start: 2021-05-23 — End: 2021-05-24
  Filled 2021-05-23 (×3): qty 0.14

## 2021-05-23 MED ORDER — SODIUM CHLORIDE 0.9 % IV SOLN: 2.0000 g | INTRAVENOUS | Status: DC

## 2021-05-23 MED ORDER — SODIUM CHLORIDE 0.9 % IV BOLUS: 500.0000 mL | Freq: Once | INTRAVENOUS | Status: DC

## 2021-05-23 MED ORDER — PROCHLORPERAZINE MALEATE 10 MG PO TABS
10.0000 mg | ORAL_TABLET | ORAL | Status: DC | PRN
  Filled 2021-05-23: qty 1

## 2021-05-23 MED ORDER — OCUVITE-LUTEIN PO CAPS
1.0000 | ORAL_CAPSULE | Freq: Every day | ORAL | Status: DC
  Administered 2021-05-25 – 2021-05-29 (×5): 1 via ORAL
  Filled 2021-05-23 (×6): qty 1

## 2021-05-23 MED ORDER — GUAIFENESIN ER 600 MG PO TB12
600.0000 mg | ORAL_TABLET | Freq: Two times a day (BID) | ORAL | Status: DC
  Administered 2021-05-24 – 2021-05-29 (×11): 600 mg via ORAL
  Filled 2021-05-23 (×12): qty 1

## 2021-05-23 MED ORDER — HYDROCODONE-ACETAMINOPHEN 10-325 MG PO TABS
1.0000 | ORAL_TABLET | Freq: Four times a day (QID) | ORAL | Status: DC | PRN
  Administered 2021-05-24 – 2021-05-29 (×5): 1 via ORAL
  Filled 2021-05-23 (×5): qty 1

## 2021-05-23 MED ORDER — ENOXAPARIN SODIUM 30 MG/0.3ML IJ SOSY
30.0000 mg | PREFILLED_SYRINGE | INTRAMUSCULAR | Status: DC
  Administered 2021-05-23 – 2021-05-29 (×7): 30 mg via SUBCUTANEOUS
  Filled 2021-05-23 (×7): qty 0.3

## 2021-05-23 MED ORDER — VITAMIN D 25 MCG (1000 UNIT) PO TABS
2000.0000 [IU] | ORAL_TABLET | Freq: Every day | ORAL | Status: DC
  Administered 2021-05-25 – 2021-05-29 (×5): 2000 [IU] via ORAL
  Filled 2021-05-23 (×5): qty 2

## 2021-05-23 MED ORDER — FERROUS SULFATE 325 (65 FE) MG PO TABS
325.0000 mg | ORAL_TABLET | Freq: Every day | ORAL | Status: DC
  Administered 2021-05-25 – 2021-05-29 (×5): 325 mg via ORAL
  Filled 2021-05-23 (×5): qty 1

## 2021-05-23 MED ORDER — HYDROCOD POLI-CHLORPHE POLI ER 10-8 MG/5ML PO SUER: 5.0000 mL | Freq: Two times a day (BID) | ORAL | Status: DC | PRN

## 2021-05-23 MED ORDER — MORPHINE SULFATE (PF) 4 MG/ML IV SOLN
4.0000 mg | Freq: Once | INTRAVENOUS | Status: AC
  Administered 2021-05-23: 2 mg via INTRAVENOUS
  Filled 2021-05-23: qty 1

## 2021-05-23 MED ORDER — ALBUTEROL SULFATE HFA 108 (90 BASE) MCG/ACT IN AERS: 2.0000 | INHALATION_SPRAY | Freq: Four times a day (QID) | RESPIRATORY_TRACT | Status: DC | PRN

## 2021-05-23 MED ORDER — NITROGLYCERIN 0.4 MG SL SUBL: 0.4000 mg | SUBLINGUAL_TABLET | SUBLINGUAL | Status: DC | PRN

## 2021-05-23 MED ORDER — ALBUTEROL SULFATE (2.5 MG/3ML) 0.083% IN NEBU: 2.5000 mg | INHALATION_SOLUTION | Freq: Four times a day (QID) | RESPIRATORY_TRACT | Status: DC | PRN

## 2021-05-23 MED ORDER — BENZONATATE 100 MG PO CAPS
100.0000 mg | ORAL_CAPSULE | Freq: Three times a day (TID) | ORAL | Status: DC
  Administered 2021-05-24 – 2021-05-29 (×15): 100 mg via ORAL
  Filled 2021-05-23 (×17): qty 1

## 2021-05-23 NOTE — ED Notes (Signed)
Patient reporting back pain. Dr. Scotty Court notified. Order received for IV morphine.  ?

## 2021-05-23 NOTE — Assessment & Plan Note (Addendum)
?   Basal coverage + SSI ?

## 2021-05-23 NOTE — Consult Note (Signed)
?  ?Wyline MoodKiran Avory Mimbs , MD ?[address], [address], [place], [place], [address] ?[address], [address], [place], [place], [address] ?Phone: [phone]  ?Fax: [phone] ? Consultation ? ?Referring Provider:     Dr Cyril LoosenKinner  ?Primary Care Physician:  Marletta LorBarr, Julie, NP ?Primary Gastroenterologist:      Dr Christella HartiganJacobs     ?Reason for Consultation:    Pill stuck and cant swallow  ? ?Date of Admission:  05/23/2021 ?Date of Consultation:  05/23/2021 ?       ? HPI:   ?Amy Calhoun is a 88 y.o. female follows up with Dr. Christella HartiganJacobs and had an upper endoscopy Nedra HaiLee in 10/04/2020 for dysphagia and appeared to have a Schatzki's ring that was dilated to 20 mm Presented to the emergency room earlier today with nausea and inability to swallow after she took her pills and feels like they got stuck in esophagus.  Had elevated sugars and a bit of AKI and has been treated with insulin and fluids.  I have been consulted for an upper endoscopy and the plan is after endoscopy to discharge. ? ?She states that she has been unable to swallow anything for the past 3 days.  Not the best historian.  Denies any chest pain.  States this has happened in the past. ?Past Medical History:  ?Diagnosis Date  ? Allergy   ? Ambulates with cane   ? Anemia   ? At high risk for falls   ? Cataract   ? removed both eyes  ? Chronic kidney disease 08/07/2016  ? Chronic renal failure, stage III  ? Coronary artery disease   ? Severe, diffuse RCA disease by cath (09/2020).  NSTEMI (12/2020) - medical therapy recommended  ? Depression   ? Esophageal dysphagia   ? Gastric ulcer   ? GERD (gastroesophageal reflux disease)   ? Hyperlipidemia   ? Hypertension   ? Hypothyroidism   ? IDDM (insulin dependent diabetes mellitus)   ? Ischemic colitis (HCC)   ? Neuromuscular disorder (HCC)   ? neuropathy  ? Osteoarthritis   ? Ulcer of esophagus   ? ? ?Past Surgical History:  ?Procedure Laterality Date  ? ABDOMINAL HYSTERECTOMY    ? CATARACT EXTRACTION, BILATERAL    ? CHOLECYSTECTOMY    ?  COLONOSCOPY  2010  ? ESOPHAGEAL DILATION  08/07/2016  ? usually a couple times a year, last time 07/30/16  ? GALLBLADDER SURGERY    ? LEFT HEART CATH AND CORONARY ANGIOGRAPHY N/A 10/11/2020  ? Procedure: LEFT HEART CATH AND CORONARY ANGIOGRAPHY;  Surgeon: Yvonne KendallEnd, Christopher, MD;  Location: [facility] INVASIVE CV LAB;  Service: Cardiovascular;  Laterality: N/A;  ? LUMBAR LAMINECTOMY/DECOMPRESSION MICRODISCECTOMY N/A 08/10/2016  ? Procedure: BILATERAL HEMILAMINECTOMY LUMBAR THREE-FOUR,LUMBAR FOUR-FIVE,LEFT LUMBAR FIVE-SACRAL ONE HEMILAMINECTOMY AND DECOMPRESSION;  Surgeon: Coletta Memosabbell, Kyle, MD;  Location: [facility] OR;  Service: Neurosurgery;  Laterality: N/A;  ? PACEMAKER IMPLANT N/A 10/12/2020  ? Procedure: PACEMAKER IMPLANT;  Surgeon: Lanier PrudeLambert, Cameron T, MD;  [facility];  Service: Cardiovascular;  Laterality: N/A;  ? TEMPORARY PACEMAKER N/A 10/11/2020  ? Procedure: TEMPORARY PACEMAKER;  Surgeon: Yvonne KendallEnd, Christopher, MD;  Location: [facility] INVASIVE CV LAB;  Service: Cardiovascular;  Laterality: N/A;  ? THYROIDECTOMY    ? UPPER GASTROINTESTINAL ENDOSCOPY    ? ? ?Prior to Admission medications   ?Medication Sig Start Date End Date Taking? Authorizing Provider  ?Acetaminophen-Codeine 300-30 MG tablet Take 1 tablet by mouth every 12 (twelve) hours as needed for pain (moderate to severe pain).  12/30/20   Fritzi Mandes, MD  ?albuterol (PROVENTIL) (2.5 MG/3ML) 0.083% nebulizer solution Take 3 mLs (2.5 mg total) by nebulization every 6 (six) hours as needed for wheezing or shortness of breath. 12/21/20   Burnard Hawthorne, FNP  ?albuterol (VENTOLIN HFA) 108 (90 Base) MCG/ACT inhaler Inhale 2 puffs into the lungs every 6 (six) hours as needed for wheezing or shortness of breath. 12/21/20   Burnard Hawthorne, FNP  ?amitriptyline (ELAVIL) 100 MG tablet Take 1 tablet (100 mg total) by mouth at bedtime. ?Patient taking differently: Take 75 mg by mouth at bedtime. 12/07/20   Burnard Hawthorne, FNP  ?ascorbic acid (VITAMIN C) 500 MG tablet  Take 500 mg by mouth daily.    [provider]  ?aspirin EC 81 MG EC tablet Take 1 tablet (81 mg total) by mouth daily. Swallow whole. 12/31/20   Fritzi Mandes, MD  ?benzonatate (TESSALON) 100 MG capsule Take 1 capsule (100 mg total) by mouth 3 (three) times daily. 12/30/20   Fritzi Mandes, MD  ?budesonide-formoterol [facility]) 80-4.5 MCG/ACT inhaler Inhale 2 puffs into the lungs 2 (two) times daily. 12/23/20   [provider]  ?Cholecalciferol (VITAMIN D) 2000 units tablet Take 2,000 Units by mouth daily.     [provider]  ?Continuous Blood Gluc Sensor (FREESTYLE LIBRE 14 DAY SENSOR) MISC 1 Package by Does not apply route every 14 (fourteen) days. 09/03/19   Shamleffer, Melanie Crazier, MD  ?Cyanocobalamin (DODEX IJ) Inject as directed every 30 (thirty) days.    [provider]  ?docusate sodium (COLACE) 100 MG capsule Take 1 capsule (100 mg total) by mouth 2 (two) times daily. ?Patient not taking: Reported on 04/14/2021 03/31/16   Theodoro Grist, MD  ?ezetimibe (ZETIA) 10 MG tablet Take 1 tablet (10 mg total) by mouth daily. 05/12/21   End, Harrell Gave, MD  ?ferrous sulfate 324 MG TBEC Take 324 mg by mouth daily with breakfast.    [provider]  ?furosemide (LASIX) 20 MG tablet Take 0.5 tablets (10 mg total) by mouth daily. 05/12/21   End, Harrell Gave, MD  ?gabapentin (NEURONTIN) 100 MG capsule Take 2 capsules (200 mg total) by mouth 3 (three) times daily. 10/17/20   Jennye Boroughs, MD  ?glucagon 1 MG injection Inject 1 mg into the muscle once as needed for up to 1 dose. Hypoglycemia If she is not tolerating PO. 12/07/20   Burnard Hawthorne, FNP  ?guaiFENesin (ROBITUSSIN) 100 MG/5ML liquid Take 10 mLs by mouth every 4 (four) hours as needed for cough or to loosen phlegm. 12/30/20   Fritzi Mandes, MD  ?HYDROcodone-acetaminophen [facility]) 10-325 MG tablet Take 1 tablet by mouth every 6 (six) hours as needed. 01/11/21   [provider]  ?insulin glargine (LANTUS SOLOSTAR)  100 UNIT/ML Solostar Pen Inject 14 Units into the skin at bedtime. 04/12/21   Shamleffer, Melanie Crazier, MD  ?insulin lispro (HUMALOG KWIKPEN) 100 UNIT/ML KwikPen Inject 10 Units into the skin 3 (three) times daily. Max Daily 30 units 04/14/21   Shamleffer, Melanie Crazier, MD  ?Insulin Pen Needle 32G X 4 MM MISC 1 Device by Does not apply route in the morning, at noon, in the evening, and at bedtime. 11/13/19   Shamleffer, Melanie Crazier, MD  ?isosorbide mononitrate (IMDUR) 30 MG 24 hr tablet Take 1 tablet (30 mg total) by mouth daily. 05/12/21 08/10/21  End, Harrell Gave, MD  ?levothyroxine (SYNTHROID) 150 MCG tablet Take 1 tablet (150 mcg total) by mouth daily before breakfast. 2 tabs on Sundays  and 1 tablet the rest of the week 04/14/21   Shamleffer, Melanie Crazier, MD  ?losartan (COZAAR) 50 MG tablet Take 1 tablet (50 mg total) by mouth daily. 05/12/21 08/10/21  End, Harrell Gave, MD  ?Solomon. Devices (BARIATRIC ROLLATOR) MISC Use as needed 07/31/19   Burnard Hawthorne, FNP  ?Multiple Vitamins-Minerals (PRESERVISION AREDS PO) Take 1 tablet by mouth daily.    [provider]  ?nitroGLYCERIN (NITROSTAT) 0.4 MG SL tablet Place 1 tablet (0.4 mg total) under the tongue every 5 (five) minutes x 3 doses as needed for chest pain. 12/30/20   Fritzi Mandes, MD  ?omeprazole (PRILOSEC) 40 MG capsule TAKE 1 CAPSULE TWICE A DAY 07/25/20   Milus Banister, MD  ?prochlorperazine (COMPAZINE) 10 MG tablet Take 10 mg by mouth every 4 (four) hours as needed. 01/24/21   [provider]  ?THICK-IT POWD See admin instructions. 01/24/21   [provider]  ?Grant Ruts INHUB 100-50 MCG/ACT AEPB Inhale 1 puff into the lungs 2 (two) times daily. 01/18/21   [provider]  ? ? ?Family History  ?Problem Relation Age of Onset  ? Diabetes Mother   ? Arthritis Mother   ? Hyperlipidemia Mother   ? Mental illness Mother   ? Heart disease Father   ? Mental illness Sister   ? Arthritis Maternal Grandmother   ? Arthritis  Maternal Grandfather   ? Colon cancer Neg Hx   ? Stomach cancer Neg Hx   ? Esophageal cancer Neg Hx   ? Rectal cancer Neg Hx   ? Colon polyps Neg Hx   ?  ? ?Social History  ? ?Tobacco Use  ? Smoking status: N

## 2021-05-23 NOTE — ED Provider Notes (Signed)
? ?[facility] ?Provider Note ? ? ? Event Date/Time  ? First MD Initiated Contact with Patient 05/23/21 1025   ?  (approximate) ? ? ?History  ? ?Nausea and Emesis ? ? ?HPI ? ?Amy Calhoun is a 88 y.o. female with a history of CKD, CAD, hypertension, hyperlipidemia, diabetes, esophageal dysmotility who presents with nausea and esophageal foreign body.  Patient reports yesterday she took her pills and she thinks they got stuck in her esophagus because since that time she has not been able to tolerate solids or liquids.  She just throws them back up again this immediately.  She reports she has had to have her esophagus dilated several times in the past. ?  ? ? ?Physical Exam  ? ?Triage Vital Signs: ?ED Triage Vitals  ?Enc Vitals Group  ?   BP 05/23/21 0938 (!) 151/69  ?   Pulse Rate 05/23/21 0938 82  ?   Resp 05/23/21 0938 16  ?   Temp 05/23/21 0938 97.9 ?F (36.6 ?C)  ?   Temp Source 05/23/21 0938 Oral  ?   SpO2 05/23/21 0938 98 %  ?   Weight 05/23/21 0940 57.2 kg (126 lb)  ?   Height 05/23/21 0940 1.626 m (5\' 4" )  ?   Head Circumference --   ?   Peak Flow --   ?   Pain Score --   ?   Pain Loc --   ?   Pain Edu? --   ?   Excl. in [place]? --   ? ? ?Most recent vital signs: ?Vitals:  ? 05/23/21 0938  ?BP: (!) 151/69  ?Pulse: 82  ?Resp: 16  ?Temp: 97.9 ?F (36.6 ?C)  ?SpO2: 98%  ? ? ? ?General: Awake, no distress.  ?CV:  Good peripheral perfusion.  ?Resp:  Normal effort.  ?Abd:  No distention.  ?Other:   ? ? ?ED Results / Procedures / Treatments  ? ?Labs ?(all labs ordered are listed, but only abnormal results are displayed) ?Labs Reviewed  ?COMPREHENSIVE METABOLIC PANEL - Abnormal; Notable for the following components:  ?    Result Value  ? Glucose, Bld 354 (*)   ? BUN 44 (*)   ? Creatinine, Ser 1.38 (*)   ? Calcium 8.8 (*)   ? Total Protein 8.2 (*)   ? GFR, Estimated 36 (*)   ? All other components within normal limits  ?CBG MONITORING, ED - Abnormal; Notable for the following components:  ?  Glucose-Capillary 352 (*)   ? All other components within normal limits  ?LIPASE, BLOOD  ?CBC  ?URINALYSIS, ROUTINE W REFLEX MICROSCOPIC  ? ? ? ?EKG ? ? ? ? ?RADIOLOGY ? ? ? ? ?PROCEDURES: ? ?Critical Care performed:  ? ?Procedures ? ? ?MEDICATIONS ORDERED IN ED: ?Medications  ?sodium chloride 0.9 % bolus 500 mL (has no administration in time range)  ?glucagon (human recombinant) (GLUCAGEN) injection 1 mg (1 mg Intravenous Given 05/23/21 1039)  ?insulin aspart (novoLOG) injection 10 Units (10 Units Subcutaneous Given 05/23/21 1037)  ? ? ? ?IMPRESSION / MDM / ASSESSMENT AND PLAN / ED COURSE  ?I reviewed the triage vital signs and the nursing notes. ? ?Patient presents with acute illness/injury that poses a threat to bodily function ? ?Patient presents with nausea vomiting as above. ? ?Suspicious for esophageal foreign body, differential includes gastritis, viral illness. ? ?Reviewed patient's outpatient records, last doctor visit was with endocrinology on 331 for management of her diabetes which is  generally well controlled ? ?She is unable to tolerate solids or liquids.  She has not been taking her insulin because she has not been eating or drinking so her glucose is elevated here.  At 354.  Anion gap is normal.  CO2 is normal.  We will give bolus of IV fluids as well as 10 units of subcu insulin. ? ?Patient CBC is normal ? ?I consulted and discussed with Dr. Vicente Males of GI, he will take the patient to endoscopy ? ? ? ? ?  ? ? ?FINAL CLINICAL IMPRESSION(S) / ED DIAGNOSES  ? ?Final diagnoses:  ?Foreign body in esophagus, initial encounter  ?Hyperglycemia  ? ? ? ?Rx / DC Orders  ? ?ED Discharge Orders   ? ? None  ? ?  ? ? ? ?Note:  This document was prepared using Dragon voice recognition software and may include unintentional dictation errors. ?  ?Lavonia Drafts, MD ?05/23/21 1102 ? ?

## 2021-05-23 NOTE — Assessment & Plan Note (Addendum)
?   PPI continued ?

## 2021-05-23 NOTE — Progress Notes (Signed)
PHARMACIST - PHYSICIAN COMMUNICATION ? ?CONCERNING:  Enoxaparin (Lovenox) for DVT Prophylaxis  ? ? ?RECOMMENDATION: ?Patient was prescribed enoxaparin 40mg  q24 hours for VTE prophylaxis.  ? Weights  ? 05/23/21 1854  ?Weight: 60.8 kg (134 lb)  ? ? ?Body mass index is 23 kg/m?. ? ?Estimated Creatinine Clearance: 20.7 mL/min (A) (by C-G formula based on SCr of 1.56 mg/dL (H)). ? ? ?Patient is candidate for enoxaparin 30mg  every 24 hours based on CrCl <70ml/min or Weight <45kg ? ?DESCRIPTION: ?Pharmacy has adjusted enoxaparin dose per [facility] policy. ? ?Patient is now receiving enoxaparin 30 mg every 24 hours  ? ?31m ?05/23/2021 ?10:04 PM  ?

## 2021-05-23 NOTE — Progress Notes (Signed)
Went over discharge instructions with daughter. After discharge she will put patient on oxygen at home tonight. Clear liquids today. ?Resume insulin after taking liquid meal. Patient alert and oriented and no shortness of breath.  ?

## 2021-05-23 NOTE — Assessment & Plan Note (Addendum)
Resume home meds ?

## 2021-05-23 NOTE — Op Note (Signed)
[facility] ?Gastroenterology ?Patient Name: Amy Calhoun ?Procedure Date: 05/23/2021 11:47 AM ?MRN: [id number] ?Account #: [id number] ?Date of Birth: 29-Jan-1928 ?Admit Type: Outpatient ?Age: 88 ?Room: [facility] ENDO ROOM 2 ?Gender: Female ?Note Status: Finalized ?Instrument Name: Upper-Endoscope [id number] ?Procedure:             Upper GI endoscopy ?Indications:           Dysphagia ?Providers:             Jonathon Bellows MD, MD ?Referring MD:          Forest Gleason Md, MD (Referring MD) ?Medicines:             Monitored Anesthesia Care ?Complications:         No immediate complications. ?Procedure:             Pre-Anesthesia Assessment: ?                       - Prior to the procedure, a History and Physical was  ?                       performed, and patient medications, allergies and  ?                       sensitivities were reviewed. The patient's tolerance  ?                       of previous anesthesia was reviewed. ?                       - The risks and benefits of the procedure and the  ?                       sedation options and risks were discussed with the  ?                       patient. All questions were answered and informed  ?                       consent was obtained. ?                       - ASA Grade Assessment: III - A patient with severe  ?                       systemic disease. ?                       After obtaining informed consent, the endoscope was  ?                       passed under direct vision. Throughout the procedure,  ?                       the patient's blood pressure, pulse, and oxygen  ?                       saturations were monitored continuously. The Endoscope  ?                       was introduced through  the mouth, and advanced to the  ?                       third part of duodenum. The upper GI endoscopy was  ?                       accomplished with ease. The patient tolerated the  ?                       procedure poorly due to the patient's respiratory  ?                        instability (hypoxia). ?Findings: ?     Fluid was found in the lower third of the esophagus. ?     Excessive fluid was found in the entire examined stomach. ?     The duodenal bulb was normal. ?Impression:            - Fluid in the lower third of the esophagus. ?                       - Excessive gastric fluid. ?                       - Normal duodenal bulb. ?                       - No specimens collected. ?Recommendation:        - Discharge patient to home (with escort). ?                       - Clear liquid diet for 1 day. ?                       - Continue present medications. ?                       - keep head end of the bed elevated ?                       No food bolus causing osbtruction seen , the stomach  ?                       was full of liquid and solid food ?Procedure Code(s):     --- Professional --- ?                       [phone], Esophagogastroduodenoscopy, flexible,  ?                       transoral; diagnostic, including collection of  ?                       specimen(s) by brushing or washing, when performed  ?                       (separate procedure) ?Diagnosis Code(s):     --- Professional --- ?                       R13.10, Dysphagia, unspecified ?CPT copyright 2019 [employer] Association. All rights  reserved. ?The codes documented in this report are preliminary and upon coder review may  ?be revised to meet current compliance requirements. ?Jonathon Bellows, MD ?Jonathon Bellows MD, MD ?05/23/2021 12:43:40 PM ?This report has been signed electronically. ?Number of Addenda: 0 ?Note Initiated On: 05/23/2021 11:47 AM ?Estimated Blood Loss:  Estimated blood loss: none. ?     [facility] ?

## 2021-05-23 NOTE — Assessment & Plan Note (Addendum)
?   AKI superimposed on stage IIIa chronic kidney disease. ?? IV normal saline. ??  follow BMP. ??  hold off nephrotoxins as able ?

## 2021-05-23 NOTE — ED Notes (Signed)
ENDO given report at this time. Per ENDO they are on way for pt. ?

## 2021-05-23 NOTE — H&P (Signed)
?  ?  ?[place] ? ? ?PATIENT NAME: Amy Calhoun   ? ?MR#:  [id number] ? ?DATE OF BIRTH:  12-28-1928 ? ?DATE OF ADMISSION:  05/23/2021 ? ?PRIMARY CARE PHYSICIAN: Alvester Chou, NP  ? ?Patient is coming from: Home ? ?REQUESTING/REFERRING PHYSICIAN: Brenton Grills, MD ? ?CHIEF COMPLAINT:  ? ?Chief Complaint  ?Patient presents with  ? Shortness of Breath  ? ? ?HISTORY OF PRESENT ILLNESS:  ?Amy Calhoun is a 88 y.o. Caucasian female with medical history significant for stage IIIa chronic kidney disease, type 2 diabetes mellitus, chronic respiratory failure on home O2 at 1 and half liters per minute, coronary artery disease, depression, GERD, hypertension, dyslipidemia and hypothyroidism, presented to emergency room with acute onset of worsening dyspnea with associated chest congestion throughout the day today and cough productive of clear sputum with associated mild wheezing.  Any fever or chills.  The patient had significant dysphagia and was seen in the ED this morning with inability to swallow and persistent vomiting after trying to eat or drink anything.  She was taken to endoscopy suite for EGD and was found to have an esophageal stricture that was dilated by Dr. Vicente Males. ? ?The procedure went uneventfully and the patient recovered in the PACU and was discharged home.  Since she went home however she experienced worsening dyspnea and cough as well as reported having chills.  She also stated that she still had nausea and vomiting before her worsening dyspnea and cough.  She is usually on 1-1/2 L of O2 by nasal cannula and her O2 requirement increased to 45 L at home to maintain an adequate pulse oximetry and was later on down to 2 to 3 L/min here.  No chest pain or palpitations.  No dysuria, oliguria or hematuria or flank pain.  No headache or dizziness or blurred vision.  No paresthesias or focal muscle weakness. ? ? ?ED Course: When she came to the ER, BP was 151/69 and later 162/59 with otherwise normal vital  signs.  Labs revealed a BUN of 49 creatinine 1.56 compared to 40/1.39 earlier this morning and 40/1 on 12/28/2020.  Calcium was 7.8 and blood glucose was 280.  CBC showed hemoglobin of 10.9 hematocrit 34.7 compared to 12.5 and 39.2 earlier today, 9.4/28.7 on 12/29/2020. ?EKG as reviewed by me : EKG showed sinus rhythm with a rate of 92 and left bundle branch block. ?Imaging: Two-view chest x-ray showed extensive airspace disease throughout the left lung concerning for pneumonia. ? ?The patient was given 2 g of IV Rocephin 500 mg of IV Zithromax, 4 mg of IV morphine sulfate and nebulized albuterol.  She will be admitted to a medical telemetry bed for further evaluation and management. ?PAST MEDICAL HISTORY:  ? ?Past Medical History:  ?Diagnosis Date  ? Allergy   ? Ambulates with cane   ? Anemia   ? At high risk for falls   ? Cataract   ? removed both eyes  ? Chronic kidney disease 08/07/2016  ? Chronic renal failure, stage III  ? Coronary artery disease   ? Severe, diffuse RCA disease by cath (09/2020).  NSTEMI (12/2020) - medical therapy recommended  ? Depression   ? Esophageal dysphagia   ? Gastric ulcer   ? GERD (gastroesophageal reflux disease)   ? Hyperlipidemia   ? Hypertension   ? Hypothyroidism   ? IDDM (insulin dependent diabetes mellitus)   ? Ischemic colitis ([place])   ? Neuromuscular disorder ([place])   ? neuropathy  ?  Osteoarthritis   ? Ulcer of esophagus   ? ? ?PAST SURGICAL HISTORY:  ? ?Past Surgical History:  ?Procedure Laterality Date  ? ABDOMINAL HYSTERECTOMY    ? CATARACT EXTRACTION, BILATERAL    ? CHOLECYSTECTOMY    ? COLONOSCOPY  2010  ? ESOPHAGEAL DILATION  08/07/2016  ? usually a couple times a year, last time 07/30/16  ? GALLBLADDER SURGERY    ? LEFT HEART CATH AND CORONARY ANGIOGRAPHY N/A 10/11/2020  ? Procedure: LEFT HEART CATH AND CORONARY ANGIOGRAPHY;  Surgeon: Nelva Bush, MD;  Location: [facility];  Service: Cardiovascular;  Laterality: N/A;  ? LUMBAR LAMINECTOMY/DECOMPRESSION  MICRODISCECTOMY N/A 08/10/2016  ? Procedure: BILATERAL HEMILAMINECTOMY LUMBAR THREE-FOUR,LUMBAR FOUR-FIVE,LEFT LUMBAR FIVE-SACRAL ONE HEMILAMINECTOMY AND DECOMPRESSION;  Surgeon: Ashok Pall, MD;  Location: [facility];  Service: Neurosurgery;  Laterality: N/A;  ? PACEMAKER IMPLANT N/A 10/12/2020  ? Procedure: PACEMAKER IMPLANT;  Surgeon: Vickie Epley, MD;  Location: [facility];  Service: Cardiovascular;  Laterality: N/A;  ? TEMPORARY PACEMAKER N/A 10/11/2020  ? Procedure: TEMPORARY PACEMAKER;  Surgeon: Nelva Bush, MD;  Location: [facility];  Service: Cardiovascular;  Laterality: N/A;  ? THYROIDECTOMY    ? UPPER GASTROINTESTINAL ENDOSCOPY    ? ? ?SOCIAL HISTORY:  ? ?Social History  ? ?Tobacco Use  ? Smoking status: Never  ? Smokeless tobacco: Never  ?Substance Use Topics  ? Alcohol use: No  ? ? ?FAMILY HISTORY:  ? ?Family History  ?Problem Relation Age of Onset  ? Diabetes Mother   ? Arthritis Mother   ? Hyperlipidemia Mother   ? Mental illness Mother   ? Heart disease Father   ? Mental illness Sister   ? Arthritis Maternal Grandmother   ? Arthritis Maternal Grandfather   ? Colon cancer Neg Hx   ? Stomach cancer Neg Hx   ? Esophageal cancer Neg Hx   ? Rectal cancer Neg Hx   ? Colon polyps Neg Hx   ? ? ?DRUG ALLERGIES:  ? ?Allergies  ?Allergen Reactions  ? Pravachol [Pravastatin] Other (See Comments)  ?  States she refused due to side effects  ? ? ?REVIEW OF SYSTEMS:  ? ?ROS ?As per history of present illness. All pertinent systems were reviewed above. Constitutional, HEENT, cardiovascular, respiratory, GI, GU, musculoskeletal, neuro, psychiatric, endocrine, integumentary and hematologic systems were reviewed and are otherwise negative/unremarkable except for positive findings mentioned above in the HPI. ? ? ?MEDICATIONS AT HOME:  ? ?Prior to Admission medications   ?Medication Sig Start Date End Date Taking? Authorizing Provider  ?Acetaminophen-Codeine 300-30 MG tablet Take 1 tablet by mouth  every 12 (twelve) hours as needed for pain (moderate to severe pain). 12/30/20   Fritzi Mandes, MD  ?albuterol (PROVENTIL) (2.5 MG/3ML) 0.083% nebulizer solution Take 3 mLs (2.5 mg total) by nebulization every 6 (six) hours as needed for wheezing or shortness of breath. 12/21/20   Burnard Hawthorne, FNP  ?albuterol (VENTOLIN HFA) 108 (90 Base) MCG/ACT inhaler Inhale 2 puffs into the lungs every 6 (six) hours as needed for wheezing or shortness of breath. 12/21/20   Burnard Hawthorne, FNP  ?amitriptyline (ELAVIL) 100 MG tablet Take 1 tablet (100 mg total) by mouth at bedtime. ?Patient taking differently: Take 75 mg by mouth at bedtime. 12/07/20   Burnard Hawthorne, FNP  ?ascorbic acid (VITAMIN C) 500 MG tablet Take 500 mg by mouth daily.    [provider]  ?aspirin EC 81 MG EC tablet Take 1 tablet (81  mg total) by mouth daily. Swallow whole. 12/31/20   Fritzi Mandes, MD  ?benzonatate (TESSALON) 100 MG capsule Take 1 capsule (100 mg total) by mouth 3 (three) times daily. 12/30/20   Fritzi Mandes, MD  ?budesonide-formoterol [facility]) 80-4.5 MCG/ACT inhaler Inhale 2 puffs into the lungs 2 (two) times daily. 12/23/20   [provider]  ?Cholecalciferol (VITAMIN D) 2000 units tablet Take 2,000 Units by mouth daily.     [provider]  ?Continuous Blood Gluc Sensor (FREESTYLE LIBRE 14 DAY SENSOR) MISC 1 Package by Does not apply route every 14 (fourteen) days. 09/03/19   Shamleffer, Melanie Crazier, MD  ?Cyanocobalamin (DODEX IJ) Inject as directed every 30 (thirty) days.    [provider]  ?docusate sodium (COLACE) 100 MG capsule Take 1 capsule (100 mg total) by mouth 2 (two) times daily. ?Patient not taking: Reported on 04/14/2021 03/31/16   Theodoro Grist, MD  ?ezetimibe (ZETIA) 10 MG tablet Take 1 tablet (10 mg total) by mouth daily. 05/12/21   End, Harrell Gave, MD  ?ferrous sulfate 324 MG TBEC Take 324 mg by mouth daily with breakfast.    [provider]  ?furosemide (LASIX) 20  MG tablet Take 0.5 tablets (10 mg total) by mouth daily. 05/12/21   End, Harrell Gave, MD  ?gabapentin (NEURONTIN) 100 MG capsule Take 2 capsules (200 mg total) by mouth 3 (three) times daily. 10/17/20   [place]

## 2021-05-23 NOTE — ED Triage Notes (Addendum)
Pt states that she is nauseated and has been vomiting for the past 3 days, pt reports that she has a hx of esophageal strictures with "stuff" getting stuck in it. Daughter states that it started yesterday am and states that it started after she administered her pills and states that she thinks that her pills are stuck ?

## 2021-05-23 NOTE — ED Notes (Signed)
Head of bed kept at 90degress. Pt passed PO intake of plain water, however, when given a pill noticed patient started coughing and clearing airway. Hold off on other medications. Provider placing oreders for ST referral for this pt  ?

## 2021-05-23 NOTE — Assessment & Plan Note (Addendum)
Continue supplemental oxygen and recommend Pulmonology follow-up. 

## 2021-05-23 NOTE — Anesthesia Postprocedure Evaluation (Signed)
Anesthesia Post Note ? ?Patient: Amy Calhoun ? ?Procedure(s) Performed: ESOPHAGOGASTRODUODENOSCOPY (EGD) WITH PROPOFOL ? ?Patient location during evaluation: PACU ?Anesthesia Type: General ?Level of consciousness: awake and alert ?Pain management: pain level controlled ?Vital Signs Assessment: post-procedure vital signs reviewed and stable ?Respiratory status: spontaneous breathing, nonlabored ventilation, respiratory function stable and patient connected to nasal cannula oxygen ?Cardiovascular status: blood pressure returned to baseline and stable ?Postop Assessment: no apparent nausea or vomiting ?Anesthetic complications: no ? ? ?No notable events documented. ? ? ?Last Vitals:  ?Vitals:  ? 05/23/21 1315 05/23/21 1319  ?BP:  (!) 91/39  ?Pulse: 81 81  ?Resp: 20 (!) 21  ?Temp:    ?SpO2: 93% 92%  ?  ?Last Pain:  ?Vitals:  ? 05/23/21 1249  ?TempSrc: Temporal  ?PainSc: 0-No pain  ? ? ?  ?  ?  ?  ?  ?  ? ?Amy Calhoun ? ? ? ? ?

## 2021-05-23 NOTE — Assessment & Plan Note (Addendum)
-    continue Synthroid. 

## 2021-05-23 NOTE — ED Notes (Signed)
Physician at bedside.

## 2021-05-23 NOTE — ED Provider Notes (Signed)
? ?[facility] ?Provider Note ? ? ? Event Date/Time  ? First MD Initiated Contact with Patient 05/23/21 2056   ?  (approximate) ? ? ?History  ? ?Shortness of Breath ? ? ?HPI ? ?Amy Calhoun is a 88 y.o. female with a history of diabetes, hypertension, CKD, chronic lung disease with oxygen use at home 1 to 1.5 L nasal cannula who comes ED complaining of worsening shortness of breath and congested cough throughout the day today.  She was seen in the ED this morning due to inability to swallow and persistent vomiting after trying to eat or drink.  She was taken to endoscopy and found to have esophageal stricture which was dilated. ? ?Outside records from gastroenterology including EGD procedure note reviewed. ? ?The procedure was uneventful and patient recovered in PACU and was discharged home.  However, since being home she has had worsening shortness of breath, dyspnea on exertion and cough.  Also reports having chills. ?  ? ? ?Physical Exam  ? ?Triage Vital Signs: ?ED Triage Vitals  ?Enc Vitals Group  ?   BP 05/23/21 1850 (!) 110/49  ?   Pulse Rate 05/23/21 1850 91  ?   Resp 05/23/21 1850 20  ?   Temp 05/23/21 1850 98.4 ?F (36.9 ?C)  ?   Temp Source 05/23/21 1850 Oral  ?   SpO2 05/23/21 1850 94 %  ?   Weight 05/23/21 1854 134 lb (60.8 kg)  ?   Height 05/23/21 1854 5\' 4"  (1.626 m)  ?   Head Circumference --   ?   Peak Flow --   ?   Pain Score 05/23/21 1854 0  ?   Pain Loc --   ?   Pain Edu? --   ?   Excl. in GC? --   ? ? ?Most recent vital signs: ?Vitals:  ? 05/23/21 1850 05/23/21 2030  ?BP: (!) 110/49 (!) 122/47  ?Pulse: 91 87  ?Resp: 20 19  ?Temp: 98.4 ?F (36.9 ?C)   ?SpO2: 94% 100%  ? ? ? ?General: Awake, no distress.  ?CV:  Good peripheral perfusion.  ?Resp:  Normal effort.  Bilateral lower lung rhonchi, left greater than right ?Abd:  No distention.  Soft and nontender ?Other:  No lower extremity edema or calf swelling or erythema or tenderness. ? ? ?ED Results / Procedures / Treatments   ? ?Labs ?(all labs ordered are listed, but only abnormal results are displayed) ?Labs Reviewed  ?BASIC METABOLIC PANEL  ?CBC WITH DIFFERENTIAL/PLATELET  ? ? ? ?EKG ? ?Interpreted by me ?Sinus rhythm rate of 92, left axis, left bundle branch block.  No acute ischemic changes. ? ? ?RADIOLOGY ?Chest x-ray viewed and interpreted by me, shows extensive airspace opacity on the left lung consistent with pneumonia.  Radiology report reviewed ? ? ? ?PROCEDURES: ? ?Critical Care performed: No ? ?Procedures ? ? ?MEDICATIONS ORDERED IN ED: ?Medications  ?cefTRIAXone (ROCEPHIN) 2 g in sodium chloride 0.9 % 100 mL IVPB (2 g Intravenous New Bag/Given 05/23/21 2109)  ?azithromycin (ZITHROMAX) 500 mg in sodium chloride 0.9 % 250 mL IVPB (has no administration in time range)  ?metroNIDAZOLE (FLAGYL) IVPB 500 mg (has no administration in time range)  ?albuterol (PROVENTIL) (2.5 MG/3ML) 0.083% nebulizer solution 2.5 mg (2.5 mg Nebulization Given 05/23/21 2021)  ?morphine (PF) 4 MG/ML injection 4 mg (2 mg Intravenous Given 05/23/21 2054)  ? ? ? ?IMPRESSION / MDM / ASSESSMENT AND PLAN / ED COURSE  ?I reviewed  the triage vital signs and the nursing notes. ?             ?               ? ?Differential diagnosis includes, but is not limited to, aspiration pneumonia, pleural effusion, pulmonary edema, pneumothorax, COPD exacerbation ? ? ?Patient presents with acute on chronic respiratory failure, now requiring about 4 L nasal cannula up from her baseline of 1.5 L.  Lung exam is diffusely rhonchorous and chest x-ray confirms a large infiltrate consistent with pneumonia.  I suspect aspiration pneumonia due to her recent esophageal stricture treatment.  We will give Rocephin, azithromycin, Flagyl.  Case discussed with hospitalist for further management.  Patient is not septic. ?  ? ? ?FINAL CLINICAL IMPRESSION(S) / ED DIAGNOSES  ? ?Final diagnoses:  ?Aspiration pneumonia of left lower lobe, unspecified aspiration pneumonia type (HCC)  ?Acute on  chronic respiratory failure with hypoxia (HCC)  ? ? ? ?Rx / DC Orders  ? ?ED Discharge Orders   ? ? None  ? ?  ? ? ? ?Note:  This document was prepared using Dragon voice recognition software and may include unintentional dictation errors. ?  ?Sharman Cheek, MD ?05/23/21 2110 ? ?

## 2021-05-23 NOTE — Anesthesia Preprocedure Evaluation (Signed)
Anesthesia Evaluation  ?Patient identified by MRN, date of birth, ID band ?Patient awake ? ? ? ?Reviewed: ?Allergy & Precautions, H&P , NPO status , Patient's Chart, lab work & pertinent test results, reviewed documented beta blocker date and time  ? ?Airway ?Mallampati: II ? ? ?Neck ROM: full ? ? ? Dental ? ?(+) Poor Dentition ?  ?Pulmonary ?neg pulmonary ROS,  ?  ?Pulmonary exam normal ? ? ? ? ? ? ? Cardiovascular ?Exercise Tolerance: Poor ?hypertension, On Medications ?+ angina with exertion + CAD, + Past MI and +CHF  ?Normal cardiovascular exam+ dysrhythmias + pacemaker  ?Rhythm:regular Rate:Normal ? ? ?  ?Neuro/Psych ?PSYCHIATRIC DISORDERS Depression  Neuromuscular disease   ? GI/Hepatic ?Neg liver ROS, PUD, GERD  Medicated,  ?Endo/Other  ?diabetes, Poorly Controlled, Type 1, Insulin DependentHypothyroidism  ? Renal/GU ?Renal disease  ?negative genitourinary ?  ?Musculoskeletal ? ? Abdominal ?  ?Peds ? Hematology ? ?(+) Blood dyscrasia, anemia ,   ?Anesthesia Other Findings ?Past Medical History: ?No date: Allergy ?No date: Ambulates with cane ?No date: Anemia ?No date: At high risk for falls ?No date: Cataract ?    Comment:  removed both eyes ?08/07/2016: Chronic kidney disease ?    Comment:  Chronic renal failure, stage III ?No date: Coronary artery disease ?    Comment:  Severe, diffuse RCA disease by cath (09/2020).  NSTEMI  ?             (12/2020) - medical therapy recommended ?No date: Depression ?No date: Esophageal dysphagia ?No date: Gastric ulcer ?No date: GERD (gastroesophageal reflux disease) ?No date: Hyperlipidemia ?No date: Hypertension ?No date: Hypothyroidism ?No date: IDDM (insulin dependent diabetes mellitus) ?No date: Ischemic colitis ([place]) ?No date: Neuromuscular disorder ([place]) ?    Comment:  neuropathy ?No date: Osteoarthritis ?No date: Ulcer of esophagus ?Past Surgical History: ?No date: ABDOMINAL HYSTERECTOMY ?No date: CATARACT EXTRACTION, BILATERAL ?No  date: CHOLECYSTECTOMY ?2010: COLONOSCOPY ?08/07/2016: ESOPHAGEAL DILATION ?    Comment:  usually a couple times a year, last time 07/30/16 ?No date: GALLBLADDER SURGERY ?10/11/2020: LEFT HEART CATH AND CORONARY ANGIOGRAPHY; N/A ?    Comment:  Procedure: LEFT HEART CATH AND CORONARY ANGIOGRAPHY;   ?             Surgeon: Nelva Bush, MD;  Location: [facility]  ?             [facility];  Service: Cardiovascular;  Laterality: N/A; ?08/10/2016: LUMBAR LAMINECTOMY/DECOMPRESSION MICRODISCECTOMY; N/A ?    Comment:  Procedure: BILATERAL HEMILAMINECTOMY LUMBAR  ?             THREE-FOUR,LUMBAR FOUR-FIVE,LEFT LUMBAR FIVE-SACRAL ONE  ?             HEMILAMINECTOMY AND DECOMPRESSION;  Surgeon: Christella Noa,  ?             Marylyn Ishihara, MD;  Location: [facility];  Service: Neurosurgery;   ?             Laterality: N/A; ?10/12/2020: PACEMAKER IMPLANT; N/A ?    Comment:  Procedure: PACEMAKER IMPLANT;  Surgeon: Lars Mage ?             T, MD;  Location: [facility];  Service:  ?             Cardiovascular;  Laterality: N/A; ?10/11/2020: TEMPORARY PACEMAKER; N/A ?    Comment:  Procedure: TEMPORARY PACEMAKER;  Surgeon: End,  ?  Harrell Gave, MD;  Location: [facility];   ?             Service: Cardiovascular;  Laterality: N/A; ?No date: THYROIDECTOMY ?No date: UPPER GASTROINTESTINAL ENDOSCOPY ?BMI   ? Body Mass Index: 23.00 kg/m?  ?  ? Reproductive/Obstetrics ?negative OB ROS ? ?  ? ? ? ? ? ? ? ? ? ? ? ? ? ?  ?  ? ? ? ? ? ? ? ? ?Anesthesia Physical ?Anesthesia Plan ? ?ASA: 4 and emergent ? ?Anesthesia Plan: General  ? ?Post-op Pain Management:   ? ?Induction:  ? ?PONV Risk Score and Plan: 4 or greater ? ?Airway Management Planned:  ? ?Additional Equipment:  ? ?Intra-op Plan:  ? ?Post-operative Plan:  ? ?Informed Consent: I have reviewed the patients History and Physical, chart, labs and discussed the procedure including the risks, benefits and alternatives for the proposed anesthesia with the patient or authorized  representative who has indicated his/her understanding and acceptance.  ? ? ? ?Dental Advisory Given ? ?Plan Discussed with: CRNA ? ?Anesthesia Plan Comments:   ? ? ? ? ? ? ?Anesthesia Quick Evaluation ? ?

## 2021-05-23 NOTE — ED Triage Notes (Signed)
Pt from home for SOB. Pt had endoscopy this AM for dysphagia evaluation and is concerned she may have aspirated during the procedure. Pt reports wearing 2-3L chronically, at home reports saturation of 80% on her 3L. Bumped to 5 by EMS prior to arrival.  ? ?115/46 ?HR 93 ?RR 25 ?96% 5L ?

## 2021-05-23 NOTE — Assessment & Plan Note (Addendum)
?   IV Rocephin and Zithromax transitioned to po amoxicciln-clavulanate  ?? mucolytic's as well as bronchodilator therapy. ?? Esophagram 05/29/2021: Demonstrating no tracheal aspiration, positive esophageal spasm and severe stricture at distal esophagus. ?? d/w family, but long-term prognosis is poor, it is likely that there will be recurrent aspiration issues with inherent complications.  Lengthy discussion with daughter at bedside regarding esophageal stricture and subsequent retention/accumulation of p.o. food/drink in esophagus predisposing to aspiration, complicated by esophageal spasm ? ?

## 2021-05-23 NOTE — Anesthesia Procedure Notes (Signed)
Date/Time: 05/23/2021 12:33 PM ?Performed by: Malva Cogan, CRNA ?Pre-anesthesia Checklist: Patient identified, Emergency Drugs available, Suction available, Patient being monitored and Timeout performed ?Patient Re-evaluated:Patient Re-evaluated prior to induction ?Oxygen Delivery Method: Nasal cannula ?Induction Type: IV induction ?Placement Confirmation: CO2 detector and positive ETCO2 ? ? ? ? ?

## 2021-05-23 NOTE — Assessment & Plan Note (Addendum)
?   as needed sublingual nitroglycerin and Zetia. ?

## 2021-05-23 NOTE — Progress Notes (Signed)
Hearing aid placed back in left ear post Endo. ?

## 2021-05-23 NOTE — Transfer of Care (Signed)
Immediate Anesthesia Transfer of Care Note ? ?Patient: Amy Calhoun ? ?Procedure(s) Performed: ESOPHAGOGASTRODUODENOSCOPY (EGD) WITH PROPOFOL ? ?Patient Location: PACU ? ?Anesthesia Type:General ? ?Level of Consciousness: awake ? ?Airway & Oxygen Therapy: Patient Spontanous Breathing and Patient connected to nasal cannula oxygen ? ?Post-op Assessment: Report given to RN and Post -op Vital signs reviewed and stable ? ?Post vital signs: Reviewed and stable ? ?Last Vitals:  ?Vitals Value Taken Time  ?BP 145/56 05/23/21 1249  ?Temp 36 ?C 05/23/21 1249  ?Pulse 87 05/23/21 1249  ?Resp 21 05/23/21 1249  ?SpO2 100 % 05/23/21 1249  ? ? ?Last Pain:  ?Vitals:  ? 05/23/21 1249  ?TempSrc: Temporal  ?PainSc:   ?   ? ?  ? ?Complications: No notable events documented. ?

## 2021-05-24 ENCOUNTER — Ambulatory Visit: Payer: Medicare Other | Admitting: Internal Medicine

## 2021-05-24 ENCOUNTER — Ambulatory Visit: Payer: Medicare Other | Admitting: Physician Assistant

## 2021-05-24 ENCOUNTER — Encounter: Payer: Self-pay | Admitting: Gastroenterology

## 2021-05-24 DIAGNOSIS — J9621 Acute and chronic respiratory failure with hypoxia: Secondary | ICD-10-CM

## 2021-05-24 DIAGNOSIS — J690 Pneumonitis due to inhalation of food and vomit: Secondary | ICD-10-CM | POA: Diagnosis not present

## 2021-05-24 DIAGNOSIS — E1165 Type 2 diabetes mellitus with hyperglycemia: Secondary | ICD-10-CM

## 2021-05-24 DIAGNOSIS — Z794 Long term (current) use of insulin: Secondary | ICD-10-CM

## 2021-05-24 DIAGNOSIS — E039 Hypothyroidism, unspecified: Secondary | ICD-10-CM | POA: Diagnosis not present

## 2021-05-24 DIAGNOSIS — N189 Chronic kidney disease, unspecified: Secondary | ICD-10-CM

## 2021-05-24 DIAGNOSIS — N179 Acute kidney failure, unspecified: Secondary | ICD-10-CM | POA: Diagnosis not present

## 2021-05-24 DIAGNOSIS — K219 Gastro-esophageal reflux disease without esophagitis: Secondary | ICD-10-CM

## 2021-05-24 DIAGNOSIS — I25810 Atherosclerosis of coronary artery bypass graft(s) without angina pectoris: Secondary | ICD-10-CM

## 2021-05-24 LAB — RESP PANEL BY RT-PCR (FLU A&B, COVID) ARPGX2
Influenza A by PCR: NEGATIVE
Influenza B by PCR: NEGATIVE
SARS Coronavirus 2 by RT PCR: NEGATIVE

## 2021-05-24 LAB — BASIC METABOLIC PANEL
Anion gap: 7 (ref 5–15)
BUN: 47 mg/dL — ABNORMAL HIGH (ref 8–23)
CO2: 21 mmol/L — ABNORMAL LOW (ref 22–32)
Calcium: 6.9 mg/dL — ABNORMAL LOW (ref 8.9–10.3)
Chloride: 111 mmol/L (ref 98–111)
Creatinine, Ser: 1.63 mg/dL — ABNORMAL HIGH (ref 0.44–1.00)
GFR, Estimated: 30 mL/min — ABNORMAL LOW (ref >60)
Glucose, Bld: 381 mg/dL — ABNORMAL HIGH (ref 70–99)
Potassium: 3.9 mmol/L (ref 3.5–5.1)
Sodium: 139 mmol/L (ref 135–145)

## 2021-05-24 LAB — CBC
HCT: 30.9 % — ABNORMAL LOW (ref 36.0–46.0)
Hemoglobin: 9.6 g/dL — ABNORMAL LOW (ref 12.0–15.0)
MCH: 28.4 pg (ref 26.0–34.0)
MCHC: 31.1 g/dL (ref 30.0–36.0)
MCV: 91.4 fL (ref 80.0–100.0)
Platelets: 150 10*3/uL (ref 150–400)
RBC: 3.38 MIL/uL — ABNORMAL LOW (ref 3.87–5.11)
RDW: 13.3 % (ref 11.5–15.5)
WBC: 8.3 10*3/uL (ref 4.0–10.5)
nRBC: 0 % (ref 0.0–0.2)

## 2021-05-24 LAB — GLUCOSE, CAPILLARY
Glucose-Capillary: 437 mg/dL — ABNORMAL HIGH (ref 70–99)
Glucose-Capillary: 301 mg/dL — ABNORMAL HIGH (ref 70–99)

## 2021-05-24 LAB — CBG MONITORING, ED: Glucose-Capillary: 466 mg/dL — ABNORMAL HIGH (ref 70–99)

## 2021-05-24 MED ORDER — INSULIN ASPART 100 UNIT/ML IJ SOLN
10.0000 [IU] | Freq: Once | INTRAMUSCULAR | Status: AC
Start: 2021-05-24 — End: 2021-05-24
  Administered 2021-05-24: 10 [IU] via SUBCUTANEOUS

## 2021-05-24 MED ORDER — INSULIN ASPART 100 UNIT/ML IJ SOLN
0.0000 [IU] | Freq: Three times a day (TID) | INTRAMUSCULAR | Status: DC
  Administered 2021-05-25: 7 [IU] via SUBCUTANEOUS
  Administered 2021-05-25: 9 [IU] via SUBCUTANEOUS
  Administered 2021-05-25: 7 [IU] via SUBCUTANEOUS
  Administered 2021-05-26 (×2): 2 [IU] via SUBCUTANEOUS
  Administered 2021-05-26: 7 [IU] via SUBCUTANEOUS
  Administered 2021-05-27: 5 [IU] via SUBCUTANEOUS
  Administered 2021-05-27: 1 [IU] via SUBCUTANEOUS
  Administered 2021-05-27: 5 [IU] via SUBCUTANEOUS
  Administered 2021-05-28 (×2): 1 [IU] via SUBCUTANEOUS
  Administered 2021-05-29: 3 [IU] via SUBCUTANEOUS
  Filled 2021-05-24 (×13): qty 1

## 2021-05-24 MED ORDER — IPRATROPIUM-ALBUTEROL 0.5-2.5 (3) MG/3ML IN SOLN
3.0000 mL | RESPIRATORY_TRACT | Status: DC | PRN
  Administered 2021-05-26 – 2021-05-27 (×2): 3 mL via RESPIRATORY_TRACT
  Filled 2021-05-24 (×2): qty 3

## 2021-05-24 MED ORDER — SODIUM CHLORIDE 0.9 % IV SOLN
3.0000 g | Freq: Two times a day (BID) | INTRAVENOUS | Status: DC
  Administered 2021-05-24 – 2021-05-26 (×5): 3 g via INTRAVENOUS
  Filled 2021-05-24: qty 3
  Filled 2021-05-24 (×2): qty 8
  Filled 2021-05-24: qty 3
  Filled 2021-05-24 (×2): qty 8

## 2021-05-24 MED ORDER — SENNOSIDES-DOCUSATE SODIUM 8.6-50 MG PO TABS
1.0000 | ORAL_TABLET | Freq: Every evening | ORAL | Status: DC | PRN
Start: 2021-05-24 — End: 2021-05-30

## 2021-05-24 MED ORDER — METOPROLOL TARTRATE 5 MG/5ML IV SOLN: 5.0000 mg | INTRAVENOUS | Status: DC | PRN

## 2021-05-24 MED ORDER — INSULIN GLARGINE-YFGN 100 UNIT/ML ~~LOC~~ SOLN
14.0000 [IU] | Freq: Every day | SUBCUTANEOUS | Status: DC
Start: 2021-05-24 — End: 2021-05-25
  Administered 2021-05-24: 14 [IU] via SUBCUTANEOUS
  Filled 2021-05-24 (×2): qty 0.14

## 2021-05-24 MED ORDER — ACETAMINOPHEN 325 MG PO TABS
650.0000 mg | ORAL_TABLET | Freq: Four times a day (QID) | ORAL | Status: DC | PRN
  Administered 2021-05-25 – 2021-05-26 (×3): 650 mg via ORAL
  Filled 2021-05-24 (×3): qty 2

## 2021-05-24 MED ORDER — INSULIN GLARGINE-YFGN 100 UNIT/ML ~~LOC~~ SOLN: 14.0000 [IU] | Freq: Every day | SUBCUTANEOUS | Status: DC

## 2021-05-24 MED ORDER — INSULIN ASPART 100 UNIT/ML IJ SOLN
12.0000 [IU] | Freq: Once | INTRAMUSCULAR | Status: AC
  Administered 2021-05-24: 12 [IU] via SUBCUTANEOUS
  Filled 2021-05-24: qty 1

## 2021-05-24 MED ORDER — SODIUM CHLORIDE 0.9 % IV SOLN: INTRAVENOUS | Status: DC

## 2021-05-24 MED ORDER — DM-GUAIFENESIN ER 30-600 MG PO TB12: 1.0000 | ORAL_TABLET | Freq: Two times a day (BID) | ORAL | Status: DC | PRN

## 2021-05-24 MED ORDER — HYDRALAZINE HCL 20 MG/ML IJ SOLN: 10.0000 mg | INTRAMUSCULAR | Status: DC | PRN

## 2021-05-24 MED ORDER — IPRATROPIUM-ALBUTEROL 0.5-2.5 (3) MG/3ML IN SOLN
3.0000 mL | Freq: Four times a day (QID) | RESPIRATORY_TRACT | Status: DC
  Administered 2021-05-24 – 2021-05-26 (×7): 3 mL via RESPIRATORY_TRACT
  Filled 2021-05-24 (×6): qty 3

## 2021-05-24 NOTE — Progress Notes (Signed)
[facility])   ?    ?Ms. Lifsey is a previous patient that just revoked hospice services on 5.8 and daughter contacted [facility] stating she wanted hospice again once patient D/C. Discussed with daughter [name] and she wants full scope of treatment right now but does want to return home with services. [facility] to continue to follow through dispo and MSW to discuss patient with assigned attending MD in the AM as patient recently transferred to the floor from ED and had not been assigned AP.  ? ?At this time, patient is not under review for services and is not active with [facility].  ? ?Please call with any questions/concerns.  ?  ?Thank you for the opportunity to participate in this patient's care. ? ?Daphene Calamity, MSW ?[place]  ?[phone] ? ?

## 2021-05-24 NOTE — ED Notes (Signed)
RN to bedside to introduce self to pt. Pt is resting.  ?

## 2021-05-24 NOTE — Consult Note (Signed)
Pharmacy Antibiotic Note ? ?Amy Calhoun is a 88 y.o. female admitted on 05/23/2021 with  aspiration pneumonia .  Pharmacy has been consulted for unasyn dosing. ? ?5/9: Patient received azithromycin, ceftriaxone and metronidazole IV x1 in the ED. ? ?Plan: ?On Day 2 of antibiotic therapy. Start Unasyn 3gm IV every 12 hours for total 7 days of therapy. ? ?Height: 5\' 4"  (162.6 cm) ?Weight: 60.8 kg (134 lb) ?IBW/kg (Calculated) : 54.7 ? ?Temp (24hrs), Avg:97.8 ?F (36.6 ?C), Min:96.8 ?F (36 ?C), Max:98.4 ?F (36.9 ?C) ? ?Recent Labs  ?Lab 05/23/21 ?0950 05/23/21 ?1945 05/24/21 ?07/24/21  ?WBC 8.6 4.0 8.3  ?CREATININE 1.38* 1.56* 1.63*  ?  ?Estimated Creatinine Clearance: 19.8 mL/min (A) (by C-G formula based on SCr of 1.63 mg/dL (H)).   ? ?Allergies  ?Allergen Reactions  ? Pravachol [Pravastatin] Other (See Comments)  ?  States she refused due to side effects  ? ? ?Antimicrobials this admission: ?5/9 Flagyl, Azithromycin, Ceftriaxone x1 in ED ?5/10 Unasyn  >>  ? ?Dose adjustments this admission: ? ? ?Microbiology results: ?None ordered. MD notified. Stop date set for 6 days ? ?Thank you for allowing pharmacy to be a part of this patient?s care. ? ?7/9 ?05/24/2021 9:11 AM ? ?

## 2021-05-24 NOTE — Progress Notes (Signed)
Inpatient Diabetes Program Recommendations ? ?AACE/ADA: New Consensus Statement on Inpatient Glycemic Control (2015) ? ?Target Ranges:  Prepandial:   less than 140 mg/dL ?     Peak postprandial:   less than 180 mg/dL (1-2 hours) ?     Critically ill patients:  140 - 180 mg/dL  ? ?Lab Results  ?Component Value Date  ? GLUCAP 309 (H) 05/23/2021  ? HGBA1C 9.1 (A) 12/12/2020  ? ? ?Review of Glycemic Control ? Latest Reference Range & Units 05/23/21 10:01 05/23/21 12:00 05/23/21 13:15 05/23/21 23:37  ?Glucose-Capillary 70 - 99 mg/dL 352 (H) 373 (H) 296 (H) 309 (H)  ?(H): Data is abnormally high ? ?Diabetes history: DM2 ?Outpatient Diabetes medications: Lantus 14 units qhs, Humalog 10 units tid meal coverage ?Current orders for Inpatient glycemic control: Semglee 14 units, Novolog 0-9 units tid correction ? ?Inpatient Diabetes Program Recommendations:   ?Patient currently in the ED. ?According to [facility], patient did not receive Semglee due to didn't receive to administer. Updated Dr. Reesa Chew. Requested to give today as soon as possible. ?May need portion of meal coverage added if eating well. ?Will follow during hospitalization. ? ?Thank you, ?Nani Gasser Olinda Nola, RN, MSN, CDE  ?Diabetes Coordinator ?Inpatient Glycemic Control Team ?Team Pager [phone] (8am-5pm) ?05/24/2021 12:14 PM ? ? ? ?

## 2021-05-24 NOTE — OR Nursing (Signed)
PT admitted to the hospital last pm for inability to maintain oxgenation. ?

## 2021-05-24 NOTE — Addendum Note (Signed)
Addendum  created 05/24/21 0904 by Malva Cogan, CRNA  ? Intraprocedure Event edited  ?  ?

## 2021-05-24 NOTE — Progress Notes (Deleted)
05/24/2021 Amy Calhoun [id number] Sep 08, 1928  Referring provider: No ref. provider found Primary GI doctor: {acdocs:[id number]}  ASSESSMENT AND PLAN:   There are no diagnoses linked to this encounter.   History of Present Illness:  88 y.o. female  with a past medical history of *** and others listed below, returns to clinic today for evaluation of ***.   Current Medications:    Current Outpatient Medications (Endocrine & Metabolic):    glucagon 1 MG injection, Inject 1 mg into the muscle once as needed for up to 1 dose. Hypoglycemia If she is not tolerating PO.   insulin glargine (LANTUS SOLOSTAR) 100 UNIT/ML Solostar Pen, Inject 14 Units into the skin at bedtime.   insulin lispro (HUMALOG KWIKPEN) 100 UNIT/ML KwikPen, Inject 10 Units into the skin 3 (three) times daily. Max Daily 30 units   levothyroxine (SYNTHROID) 150 MCG tablet, Take 1 tablet (150 mcg total) by mouth daily before breakfast. 2 tabs on Sundays and 1 tablet the rest of the week  Facility-Administered Medications Ordered in Other Visits (Endocrine & Metabolic):    insulin glargine-yfgn (SEMGLEE) injection 14 Units   levothyroxine (SYNTHROID) tablet 150 mcg   Current Outpatient Medications (Cardiovascular):    ezetimibe (ZETIA) 10 MG tablet, Take 1 tablet (10 mg total) by mouth daily.   furosemide (LASIX) 20 MG tablet, Take 0.5 tablets (10 mg total) by mouth daily.   isosorbide mononitrate (IMDUR) 30 MG 24 hr tablet, Take 1 tablet (30 mg total) by mouth daily.   losartan (COZAAR) 50 MG tablet, Take 1 tablet (50 mg total) by mouth daily.   nitroGLYCERIN (NITROSTAT) 0.4 MG SL tablet, Place 1 tablet (0.4 mg total) under the tongue every 5 (five) minutes x 3 doses as needed for chest pain.  Facility-Administered Medications Ordered in Other Visits (Cardiovascular):    ezetimibe (ZETIA) tablet 10 mg   furosemide (LASIX) tablet 10 mg   isosorbide mononitrate (IMDUR) 24 hr tablet 30 mg   losartan (COZAAR) tablet  50 mg   nitroGLYCERIN (NITROSTAT) SL tablet 0.4 mg   Current Outpatient Medications (Respiratory):    albuterol (PROVENTIL) (2.5 MG/3ML) 0.083% nebulizer solution, Take 3 mLs (2.5 mg total) by nebulization every 6 (six) hours as needed for wheezing or shortness of breath.   albuterol (VENTOLIN HFA) 108 (90 Base) MCG/ACT inhaler, Inhale 2 puffs into the lungs every 6 (six) hours as needed for wheezing or shortness of breath.   benzonatate (TESSALON) 100 MG capsule, Take 1 capsule (100 mg total) by mouth 3 (three) times daily. (Patient not taking: Reported on 05/23/2021)   budesonide-formoterol (SYMBICORT) 80-4.5 MCG/ACT inhaler, Inhale 2 puffs into the lungs 2 (two) times daily.   guaiFENesin (ROBITUSSIN) 100 MG/5ML liquid, Take 10 mLs by mouth every 4 (four) hours as needed for cough or to loosen phlegm.   WIXELA INHUB 100-50 MCG/ACT AEPB, Inhale 1 puff into the lungs 2 (two) times daily.  Facility-Administered Medications Ordered in Other Visits (Respiratory):    albuterol (PROVENTIL) (2.5 MG/3ML) 0.083% nebulizer solution 2.5 mg   benzonatate (TESSALON) capsule 100 mg   chlorpheniramine-HYDROcodone 10-8 MG/5ML suspension 5 mL   guaiFENesin (MUCINEX) 12 hr tablet 600 mg   ipratropium-albuterol (DUONEB) 0.5-2.5 (3) MG/3ML nebulizer solution 3 mL   Current Outpatient Medications (Analgesics):    Acetaminophen-Codeine 300-30 MG tablet, Take 1 tablet by mouth every 12 (twelve) hours as needed for pain (moderate to severe pain). (Patient not taking: Reported on 05/23/2021)   aspirin EC 81 MG EC tablet, Take  1 tablet (81 mg total) by mouth daily. Swallow whole.   HYDROcodone-acetaminophen (NORCO) 10-325 MG tablet, Take 1 tablet by mouth every 6 (six) hours as needed.  Facility-Administered Medications Ordered in Other Visits (Analgesics):    acetaminophen (TYLENOL) tablet 650 mg **OR** acetaminophen (TYLENOL) suppository 650 mg   aspirin EC tablet 81 mg   HYDROcodone-acetaminophen (NORCO) 10-325  MG per tablet 1 tablet   Current Outpatient Medications (Hematological):    Cyanocobalamin (DODEX IJ), Inject as directed every 30 (thirty) days.   ferrous sulfate 324 MG TBEC, Take 324 mg by mouth daily with breakfast.  Facility-Administered Medications Ordered in Other Visits (Hematological):    enoxaparin (LOVENOX) injection 30 mg   ferrous sulfate tablet 325 mg   Current Outpatient Medications (Other):    amitriptyline (ELAVIL) 100 MG tablet, Take 1 tablet (100 mg total) by mouth at bedtime. (Patient taking differently: Take 75 mg by mouth at bedtime.)   ascorbic acid (VITAMIN C) 500 MG tablet, Take 500 mg by mouth daily.   Cholecalciferol (VITAMIN D) 2000 units tablet, Take 2,000 Units by mouth daily.    Continuous Blood Gluc Sensor (FREESTYLE LIBRE 14 DAY SENSOR) MISC, 1 Package by Does not apply route every 14 (fourteen) days.   docusate sodium (COLACE) 100 MG capsule, Take 1 capsule (100 mg total) by mouth 2 (two) times daily. (Patient not taking: Reported on 04/14/2021)   gabapentin (NEURONTIN) 100 MG capsule, Take 2 capsules (200 mg total) by mouth 3 (three) times daily.   Insulin Pen Needle 32G X 4 MM MISC, 1 Device by Does not apply route in the morning, at noon, in the evening, and at bedtime.   Misc. Devices (BARIATRIC ROLLATOR) MISC, Use as needed   Multiple Vitamins-Minerals (PRESERVISION AREDS PO), Take 1 tablet by mouth daily.   omeprazole (PRILOSEC) 40 MG capsule, TAKE 1 CAPSULE TWICE A DAY   prochlorperazine (COMPAZINE) 10 MG tablet, Take 10 mg by mouth every 4 (four) hours as needed.   THICK-IT POWD, See admin instructions.  Facility-Administered Medications Ordered in Other Visits (Other):    0.9 %  sodium chloride infusion   ascorbic acid (VITAMIN C) tablet 500 mg   azithromycin (ZITHROMAX) 500 mg in sodium chloride 0.9 % 250 mL IVPB   cefTRIAXone (ROCEPHIN) 2 g in sodium chloride 0.9 % 100 mL IVPB   cholecalciferol (VITAMIN D3) tablet 2,000 Units   gabapentin  (NEURONTIN) capsule 200 mg   magnesium hydroxide (MILK OF MAGNESIA) suspension 30 mL   multivitamin-lutein (OCUVITE-LUTEIN) capsule 1 capsule   ondansetron (ZOFRAN) tablet 4 mg **OR** ondansetron (ZOFRAN) injection 4 mg   pantoprazole (PROTONIX) EC tablet 40 mg   prochlorperazine (COMPAZINE) tablet 10 mg   traZODone (DESYREL) tablet 25 mg No current facility-administered medications for this visit.  Surgical History:  She  has a past surgical history that includes Gallbladder surgery; Abdominal hysterectomy; Thyroidectomy; Esophageal dilation (08/07/2016); Lumbar laminectomy/decompression microdiscectomy (N/A, 08/10/2016); Colonoscopy (2010); Cataract extraction, bilateral; Upper gastrointestinal endoscopy; Cholecystectomy; LEFT HEART CATH AND CORONARY ANGIOGRAPHY (N/A, 10/11/2020); TEMPORARY PACEMAKER (N/A, 10/11/2020); and PACEMAKER IMPLANT (N/A, 10/12/2020). Family History:  Her family history includes Arthritis in her maternal grandfather, maternal grandmother, and mother; Diabetes in her mother; Heart disease in her father; Hyperlipidemia in her mother; Mental illness in her mother and sister. Social History:   reports that she has never smoked. She has never used smokeless tobacco. She reports that she does not drink alcohol and does not use drugs.  Current Medications, Allergies, Past Medical History, Past  Surgical History, Family History and Social History were reviewed in [facility] electronic medical record.  Physical Exam: There were no vitals taken for this visit. General:   Pleasant, well developed female in no acute distress Heart : Regular rate and rhythm; no murmurs Pulm: Clear anteriorly; no wheezing Abdomen:  {BlankSingle:19197::"Distended","Ridged","Soft"}, {BlankSingle:19197::"Flat","Obese","Non-distended"} AB, {BlankSingle:19197::"Absent","Hyperactive, tinkling","Hypoactive","Sluggish","Active"} bowel sounds. {actendernessAB:27319} tenderness {anatomy; site abdomen:5010}.  {BlankMultiple:19196::"Without guarding","With guarding","Without rebound","With rebound"}, No organomegaly appreciated. Extremities:  {With/without:5700}  edema. Neurologic:  Alert and  oriented x4;  No focal deficits.  Psych:  Cooperative. Normal mood and affect.   Doree Albeemanda R Murry Khiev, PA-C 05/24/21

## 2021-05-24 NOTE — Evaluation (Signed)
Clinical/Bedside Swallow Evaluation ?Patient Details  ?Name: Amy Calhoun ?MRN: [id number] ?Date of Birth: 1928-11-18 ? ?Today's Date: 05/24/2021 ?Time: SLP Start Time (ACUTE ONLY): 0850 SLP Stop Time (ACUTE ONLY): 0950 ?SLP Time Calculation (min) (ACUTE ONLY): 60 min ? ?Past Medical History:  ?Past Medical History:  ?Diagnosis Date  ? Allergy   ? Ambulates with cane   ? Anemia   ? At high risk for falls   ? Cataract   ? removed both eyes  ? Chronic kidney disease 08/07/2016  ? Chronic renal failure, stage III  ? Coronary artery disease   ? Severe, diffuse RCA disease by cath (09/2020).  NSTEMI (12/2020) - medical therapy recommended  ? Depression   ? Esophageal dysphagia   ? Gastric ulcer   ? GERD (gastroesophageal reflux disease)   ? Hyperlipidemia   ? Hypertension   ? Hypothyroidism   ? IDDM (insulin dependent diabetes mellitus)   ? Ischemic colitis (HCC)   ? Neuromuscular disorder (HCC)   ? neuropathy  ? Osteoarthritis   ? Ulcer of esophagus   ? ?Past Surgical History:  ?Past Surgical History:  ?Procedure Laterality Date  ? ABDOMINAL HYSTERECTOMY    ? CATARACT EXTRACTION, BILATERAL    ? CHOLECYSTECTOMY    ? COLONOSCOPY  2010  ? ESOPHAGEAL DILATION  08/07/2016  ? usually a couple times a year, last time 07/30/16  ? ESOPHAGOGASTRODUODENOSCOPY (EGD) WITH PROPOFOL N/A 05/23/2021  ? Procedure: ESOPHAGOGASTRODUODENOSCOPY (EGD) WITH PROPOFOL;  Surgeon: [name], MD;  Location: [facility] ENDOSCOPY;  Service: Gastroenterology;  Laterality: N/A;  ? GALLBLADDER SURGERY    ? LEFT HEART CATH AND CORONARY ANGIOGRAPHY N/A 10/11/2020  ? Procedure: LEFT HEART CATH AND CORONARY ANGIOGRAPHY;  Surgeon: Yvonne Kendall, MD;  Location: [facility] INVASIVE CV LAB;  Service: Cardiovascular;  Laterality: N/A;  ? LUMBAR LAMINECTOMY/DECOMPRESSION MICRODISCECTOMY N/A 08/10/2016  ? Procedure: BILATERAL HEMILAMINECTOMY LUMBAR THREE-FOUR,LUMBAR FOUR-FIVE,LEFT LUMBAR FIVE-SACRAL ONE HEMILAMINECTOMY AND DECOMPRESSION;  Surgeon: Coletta Memos, MD;   Location: [facility] OR;  Service: Neurosurgery;  Laterality: N/A;  ? PACEMAKER IMPLANT N/A 10/12/2020  ? Procedure: PACEMAKER IMPLANT;  Surgeon: Lanier Prude, MD;  Location: [facility] INVASIVE CV LAB;  Service: Cardiovascular;  Laterality: N/A;  ? TEMPORARY PACEMAKER N/A 10/11/2020  ? Procedure: TEMPORARY PACEMAKER;  Surgeon: Yvonne Kendall, MD;  Location: [facility] INVASIVE CV LAB;  Service: Cardiovascular;  Laterality: N/A;  ? THYROIDECTOMY    ? UPPER GASTROINTESTINAL ENDOSCOPY    ? ?HPI:  ?Pt is a 88 y.o. Caucasian female with medical history significant for stage IIIa chronic kidney disease, type 2 diabetes mellitus, chronic respiratory failure on home O2 at 1 and half liters per minute, coronary artery disease, depression, Esophageal phase dysphagia, GERD, hypertension, dyslipidemia and hypothyroidism, presented to emergency room with acute onset of worsening dyspnea with associated chest congestion throughout the day and cough productive of clear sputum with associated mild wheezing.  No fever or chills.  The patient has significant Esophageal phase dysphagia and was seen in the ED with inability to swallow and persistent vomiting after trying to eat or drink anything.  She was taken to Endoscopy suite for EGD and was found to have an Esophageal Stricture that was dilated by Dr. Tobi Bastos.   The procedure went uneventfully and the patient recovered in the PACU and was discharged home.  Once she returned home however, she experienced worsening dyspnea and cough as well as reported having chills.  She also stated that she still had nausea and vomiting before her worsening dyspnea and cough.  She is usually on 1-1/2 L of O2 by nasal cannula and her O2 requirement increased to 4-5L at home to maintain an adequate pulse oximetry and was later on down to 2 to 3 L/min here.  No chest pain or palpitations.  No dysuria, oliguria or hematuria or flank pain.  No headache or dizziness or blurred vision.  No paresthesias or focal muscle  weakness.    ?A previous HEAD CT 10/2020: "Global  parenchymal volume loss. Periventricular white matter hypodensities  consistent with sequela of chronic microvascular ischemic disease.". Unsure of pt's Baseline Cognitive status; Dtr monitors pt's care at home.     ?CXR: Extensive airspace disease throughout the left lung concerning for  pneumonia -- similar CXR presentation 12/2020. ?  ?  ?Assessment / Plan / Recommendation  ?Clinical Impression ? Pt seen by this service in 12/2020. Both pt and Daughter preferred to remain on Nectar consistency liquids at that time for discharge to be conservative re: swallowing and reduce risk for aspiration, choking. Per Dtr's report, they have continued the Nectar liquids since then and are content to continue such to reduce risks, per Dtr. Pt does have a Baseline of Esophageal phase Dysphagia and had Dilation by GI/EGD yesterday. Unsure of Baseline Cognitive status; advanced age. She is currently on 1-3L O2 support. Dentures+. ? ?Pt appears to present w/ adequate oropharyngeal phase swallowing function w/ trials given; no thin liquids given in setting of overt s/s of aspiration noted w/ thin liquids during previous admit -- suspected then neuromuscular deficits w/ impact from both medical and cognitive issues affecting her overall  swallowing safety and engagement during po tasks. Pt appears at increases risk for aspiration and pulmonary decline in setting of Chronic Esophageal phase Dysphagia, medical dxs, advanced age. These risks can be reduced when following a modified diet for easier Esophageal clearing of oral intake and following aspiration precautions including use of Nectar consistency liquids per Dtr's request.  ? ?Pt required min+ verbal/visual cues during bolus presentation, follow through w/ tasks, and self-feeding support. Min impulsive drinking behavior noted requiring Supervision. Pt consumed trials of single ice chips, Nectar liquids via Cup, purees, and soft  solids w/ no immediate, overt clinical s/s of aspiration noted during trials. O2 sats remained mid 90s. Oral phase was c/b grossly adequate bolus management and oral clearing of all boluses given. She required min increased Time for mastication and A-P transfer w/ increased textured, solid foods -- unsure if related to overall weakness, and decreased attention/awareness. Oral clearing achieved w/ all trials. OM exam was functional/adequate w/ no unilateral weakness was noted. Speech clear w/ low volume.  OF NOTE: pt endorsed discomfort in Mid-Sternum area post several trials -- Rest Break given b/f continuing oral intake. This occurred x2 episodes. Suspect related to recent EGD, Dilation. Strongly recommended Rest Breaks during ANY oral intake/meals to allow for Esophageal phase clearing, and f/u w/ GI for management.       ? ?Recommend dysphagia level 2 diet(minced foods, moistened) w/ Nectar consistency liquids as preferred; aspiration precautions; GERD/REFLUX precautions. Pills Crushed in puree for safety and clearing; Tray setup at meals w/ feeding support and supervision at meals as needed, reduce Distractions during meals. NSG/MD updated. ?SLP Visit Diagnosis: Dysphagia, pharyngoesophageal phase (R13.14) (baseline Esophageal phase dysmotility) ?   ?Aspiration Risk ? Mild aspiration risk;Risk for inadequate nutrition/hydration (from an Esophageal and pharyngeal phase standpoint; reduced following precautions, strategies)  ?  ?Diet Recommendation   dysphagia level 2 diet(minced foods, moistened) w/ Nectar  consistency liquids as preferred; aspiration precautions; GERD/REFLUX precautions. Tray setup at meals w/ feeding support and supervision at meals as needed, reduce Distractions during meals.  ? ?Medication Administration: Crushed with puree (for safer swallowing)  ?  ?Other  Recommendations Recommended Consults: Consider GI evaluation;Consider esophageal assessment (ongoing management and tx) ?Oral Care  Recommendations: Oral care BID;Oral care before and after PO;Staff/trained caregiver to provide oral care (Denture care) ?Other Recommendations:  (n/a)   ? ?Recommendations for follow up therapy are one component of

## 2021-05-24 NOTE — Progress Notes (Signed)
?PROGRESS NOTE ? ? ? ?Amy Calhoun  OIB:[id number] DOB: 07-Sep-1928 DOA: 05/23/2021 ?PCP: Marletta Lor, NP  ? ?Brief Narrative:  ?88 year old with history of CKD stage IIIa, DM2, chronic hypoxia on 1 L nasal cannula at home, CAD, depression, GERD, HTN, HLD, hypothyroidism admitted for shortness of breath and cough.  Patient had an endoscopy yesterday for esophageal stricture dilation.  Postprocedure patient started having nausea vomiting with significant hypoxia, chest x-ray showed concerns of aspiration pneumonia.  Initially started on IV Rocephin and azithromycin. ? ? ?Assessment & Plan: ? Principal Problem: ?  Aspiration pneumonia (HCC) ?Active Problems: ?  Acute kidney injury superimposed on chronic kidney disease (HCC) ?  Acute on chronic respiratory failure with hypoxia (HCC) ?  Uncontrolled type 2 diabetes mellitus with hyperglycemia, with long-term current use of insulin (HCC) ?  Acquired hypothyroidism ?  Essential hypertension ?  GERD (gastroesophageal reflux disease) ?  Coronary artery disease without angina pectoris ?  ? ? ?Assessment and Plan: ?* Aspiration pneumonia (HCC) ?-Likely postprocedure from nausea vomiting ?- Change antibiotics to IV Unasyn ?- As needed bronchodilators, I-S/flutter valve ? ?D2 diet. Seen by S&S ? ?Acute kidney injury superimposed on chronic kidney disease (HCC) ?-Baseline creatinine 1.0.  Admission creatinine 1.56.  Continue gentle hydration.  . ? ?Acute on chronic respiratory failure with hypoxia (HCC) ?-Supplemental oxygen as needed ? 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  Semglee 14 units at bedtime ?-May need to adjust gabapentin due to AKI ? ?Acquired hypothyroidism ?-Synthroid ? ?Coronary artery disease without angina pectoris ?Chronic diastolic CHF, grade 2 DD ?-Currently chest pain-free.  Continue aspirin, Imdur,  Zetia ?-Alcohol and discontinue losartan and Lasix ? ?GERD (gastroesophageal reflux disease) ?-PPI daily ? ?Essential hypertension ?-Continue Imdur, IV ? ? ? ? ? ?DVT prophylaxis: Lovenox ?Code Status: DNR ?Family Communication:  Daughter at bedside ? ?Status is: Inpatient ?Remains inpatient appropriate because: Feels ok, has been having trouble with swallowing. ON IV Abx, bronchodilator. Not at baseline.  ? ? ? ?Subjective: ?Feeling ok, still has exertional SOB ? ? ?Examination: ?Constitutional: Not in acute distress. 2L [place]. Elderly frail.  ?Respiratory: diminished BS on the left sided especially at the bases.  ?Cardiovascular: Normal sinus rhythm, no rubs ?Abdomen: Nontender nondistended good bowel sounds ?Musculoskeletal: No edema noted ?Skin: No rashes seen ?Neurologic: CN 2-12 grossly intact.  And nonfocal ?Psychiatric: Normal judgment and insight. Alert and oriented x 3. Normal mood.  ? ? ? ?Objective: ?Vitals:  ? 05/24/21 0700 05/24/21 0730 05/24/21 0800 05/24/21 0830  ?BP: (!) 110/49 (!) 117/55 (!) 121/54 (!) 113/44  ?Pulse: 81 85 84 81  ?Resp: 16 18 17 15   ?Temp:      ?TempSrc:      ?SpO2: 95% 95% 97% 97%  ?Weight:      ?Height:      ? ? ?Intake/Output Summary (Last 24 hours) at 05/24/2021 0838 ?Last data filed at 05/23/2021 2346 ?Gross per 24 hour  ?Intake 453.03 ml  ?Output --  ?Net 453.03 ml  ? ?Filed Weights  ? 05/23/21 1854  ?Weight: 60.8 kg  ? ? ? ?Data Reviewed:  ? ?CBC: ?Recent Labs  ?Lab 05/23/21 ?0950 05/23/21 ?1945 05/24/21 ?9450  ?WBC 8.6 4.0 8.3  ?NEUTROABS  --  3.1  --   ?HGB 12.5 10.9* 9.6*  ?HCT 39.2 34.7* 30.9*  ?MCV 88.5 89.9 91.4  ?PLT 224 161  150  ? ?Basic Metabolic Panel: ?Recent Labs  ?Lab 05/23/21 ?0950 05/23/21 ?1945 05/24/21 ?[id number]  ?NA 136 137 139  ?K 5.0 3.8 3.9  ?CL 101 106 111  ?CO2 25 23 21*  ?GLUCOSE 354* 280* 381*  ?BUN 44* 49* 47*  ?CREATININE 1.38* 1.56* 1.63*  ?CALCIUM 8.8* 7.8* 6.9*  ? ?GFR: ?Estimated Creatinine Clearance: 19.8 mL/min (A) (by C-G formula based on SCr of 1.63  mg/dL (H)). ?Liver Function Tests: ?Recent Labs  ?Lab 05/23/21 ?0950  ?AST 27  ?ALT 17  ?ALKPHOS 72  ?BILITOT 0.8  ?PROT 8.2*  ?ALBUMIN 4.3  ? ?Recent Labs  ?Lab 05/23/21 ?0950  ?LIPASE 24  ? ?No results for input(s): AMMONIA in the last 168 hours. ?Coagulation Profile: ?No results for input(s): INR, PROTIME in the last 168 hours. ?Cardiac Enzymes: ?No results for input(s): CKTOTAL, CKMB, CKMBINDEX, TROPONINI in the last 168 hours. ?BNP (last 3 results) ?No results for input(s): PROBNP in the last 8760 hours. ?HbA1C: ?No results for input(s): HGBA1C in the last 72 hours. ?CBG: ?Recent Labs  ?Lab 05/23/21 ?1001 05/23/21 ?1200 05/23/21 ?1315 05/23/21 ?2337  ?GLUCAP 352* 373* 296* 309*  ? ?Lipid Profile: ?No results for input(s): CHOL, HDL, LDLCALC, TRIG, CHOLHDL, LDLDIRECT in the last 72 hours. ?Thyroid Function Tests: ?No results for input(s): TSH, T4TOTAL, FREET4, T3FREE, THYROIDAB in the last 72 hours. ?Anemia Panel: ?No results for input(s): VITAMINB12, FOLATE, FERRITIN, TIBC, IRON, RETICCTPCT in the last 72 hours. ?Sepsis Labs: ?No results for input(s): PROCALCITON, LATICACIDVEN in the last 168 hours. ? ?Recent Results (from the past 240 hour(s))  ?Resp Panel by RT-PCR (Flu A&B, Covid) Nasopharyngeal Swab     Status: None  ? Collection Time: 05/23/21 11:33 PM  ? Specimen: Nasopharyngeal Swab; Nasopharyngeal(NP) swabs in vial transport medium  ?Result Value Ref Range Status  ? SARS Coronavirus 2 by RT PCR NEGATIVE NEGATIVE Final  ?  Comment: (NOTE) ?SARS-CoV-2 target nucleic acids are NOT DETECTED. ? ?The SARS-CoV-2 RNA is generally detectable in upper respiratory ?specimens during the acute phase of infection. The lowest ?concentration of SARS-CoV-2 viral copies this assay can detect is ?138 copies/mL. A negative result does not preclude SARS-Cov-2 ?infection and should not be used as the sole basis for treatment or ?other patient management decisions. A negative result may occur with  ?improper specimen  collection/handling, submission of specimen other ?than nasopharyngeal swab, presence of viral mutation(s) within the ?areas targeted by this assay, and inadequate number of viral ?copies(<138 copies/mL). A negative result must be combined with ?clinical observations, patient history, and epidemiological ?information. The expected result is Negative. ? ?Fact Sheet for Patients:  ?EntrepreneurPulse.com.au ? ?Fact Sheet for Healthcare Providers:  ?IncredibleEmployment.be ? ?This test is no t yet approved or cleared by the [place] FDA and  ?has been authorized for detection and/or diagnosis of SARS-CoV-2 by ?FDA under an Emergency Use Authorization (EUA). This EUA will remain  ?in effect (meaning this test can be used) for the duration of the ?COVID-19 declaration under Section 564(b)(1) of the Act, 21 ?U.S.C.section 360bbb-3(b)(1), unless the authorization is terminated  ?or revoked sooner.  ? ? ?  ? Influenza A by PCR NEGATIVE NEGATIVE Final  ? Influenza B by PCR NEGATIVE NEGATIVE Final  ?  Comment: (NOTE) ?The Xpert Xpress SARS-CoV-2/FLU/RSV plus assay is intended as an aid ?in the diagnosis of influenza from Nasopharyngeal swab specimens and ?should not be used as a sole basis for treatment. Nasal washings and ?aspirates are unacceptable for Xpert Xpress SARS-CoV-2/FLU/RSV ?  testing. ? ?Fact Sheet for Patients: ?EntrepreneurPulse.com.au ? ?Fact Sheet for Healthcare Providers: ?IncredibleEmployment.be ? ?This test is not yet approved or cleared by the [place] FDA and ?has been authorized for detection and/or diagnosis of SARS-CoV-2 by ?FDA under an Emergency Use Authorization (EUA). This EUA will remain ?in effect (meaning this test can be used) for the duration of the ?COVID-19 declaration under Section 564(b)(1) of the Act, 21 U.S.C. ?section 360bbb-3(b)(1), unless the authorization is terminated or ?revoked. ? ?Performed at [facility], [place], ?[place] [address] ?  ?  ? ? ? ? ? ?Radiology Studies: ?DG Chest 2 View ? ?Result Date: 05/23/2021 ?CLINICAL DATA:  Shortness of breath, cough EXAM: CHEST - 2 VIEW COMPAR

## 2021-05-25 DIAGNOSIS — N179 Acute kidney failure, unspecified: Secondary | ICD-10-CM | POA: Diagnosis not present

## 2021-05-25 DIAGNOSIS — E039 Hypothyroidism, unspecified: Secondary | ICD-10-CM | POA: Diagnosis not present

## 2021-05-25 DIAGNOSIS — J9621 Acute and chronic respiratory failure with hypoxia: Secondary | ICD-10-CM | POA: Diagnosis not present

## 2021-05-25 DIAGNOSIS — J690 Pneumonitis due to inhalation of food and vomit: Secondary | ICD-10-CM | POA: Diagnosis not present

## 2021-05-25 LAB — CBC
HCT: 32.8 % — ABNORMAL LOW (ref 36.0–46.0)
Hemoglobin: 10.2 g/dL — ABNORMAL LOW (ref 12.0–15.0)
MCH: 28.4 pg (ref 26.0–34.0)
MCHC: 31.1 g/dL (ref 30.0–36.0)
MCV: 91.4 fL (ref 80.0–100.0)
Platelets: 146 10*3/uL — ABNORMAL LOW (ref 150–400)
RBC: 3.59 MIL/uL — ABNORMAL LOW (ref 3.87–5.11)
RDW: 13.5 % (ref 11.5–15.5)
WBC: 10.9 10*3/uL — ABNORMAL HIGH (ref 4.0–10.5)
nRBC: 0 % (ref 0.0–0.2)

## 2021-05-25 LAB — MAGNESIUM: Magnesium: 2.2 mg/dL (ref 1.7–2.4)

## 2021-05-25 LAB — BASIC METABOLIC PANEL
Anion gap: 7 (ref 5–15)
BUN: 60 mg/dL — ABNORMAL HIGH (ref 8–23)
CO2: 22 mmol/L (ref 22–32)
Calcium: 7.5 mg/dL — ABNORMAL LOW (ref 8.9–10.3)
Chloride: 106 mmol/L (ref 98–111)
Creatinine, Ser: 1.8 mg/dL — ABNORMAL HIGH (ref 0.44–1.00)
GFR, Estimated: 26 mL/min — ABNORMAL LOW (ref >60)
Glucose, Bld: 405 mg/dL — ABNORMAL HIGH (ref 70–99)
Potassium: 4.2 mmol/L (ref 3.5–5.1)
Sodium: 135 mmol/L (ref 135–145)

## 2021-05-25 LAB — GLUCOSE, CAPILLARY
Glucose-Capillary: 362 mg/dL — ABNORMAL HIGH (ref 70–99)
Glucose-Capillary: 311 mg/dL — ABNORMAL HIGH (ref 70–99)
Glucose-Capillary: 310 mg/dL — ABNORMAL HIGH (ref 70–99)
Glucose-Capillary: 275 mg/dL — ABNORMAL HIGH (ref 70–99)

## 2021-05-25 MED ORDER — SODIUM CHLORIDE 0.9 % IV SOLN: INTRAVENOUS | Status: AC

## 2021-05-25 MED ORDER — INSULIN GLARGINE-YFGN 100 UNIT/ML ~~LOC~~ SOLN
17.0000 [IU] | Freq: Every day | SUBCUTANEOUS | Status: DC
  Administered 2021-05-25 – 2021-05-28 (×4): 17 [IU] via SUBCUTANEOUS
  Filled 2021-05-25 (×5): qty 0.17

## 2021-05-25 MED ORDER — INSULIN ASPART 100 UNIT/ML IJ SOLN
5.0000 [IU] | Freq: Three times a day (TID) | INTRAMUSCULAR | Status: DC
  Administered 2021-05-25 – 2021-05-29 (×5): 5 [IU] via SUBCUTANEOUS
  Filled 2021-05-25 (×9): qty 1

## 2021-05-25 NOTE — Progress Notes (Signed)
[facility]) Hospital Liaison Note ?  ?Received request from Transitions of Care Manager, Shelton Silvas, for hospice services at home after discharge. Chart and patient information under review by [facility] physician. Hospice eligibility approved. ?  ?Spoke with daughter/Emmato initiate education related to hospice philosophy, services, and team approach to care. [name]  verbalized understanding of information given. Per discussion, the plan is for patient to discharge home via private vehvile once cleared to DC.  ?  ?DME needs discussed. Patient has the following equipment in the home: ?rollator ?cane ?hospital bed ?OBT ?oxygen at 2L via [place] ?BSC ?Patient requests the following equipment for delivery: ?N/A ? ?Address verified and is correct in the chart.  ?  ?Please send signed and completed DNR home with patient/family. Please provide prescriptions at discharge as needed to ensure ongoing symptom management.  ?  ?[facility] information and contact numbers given to family & above information shared with TOC. ?  ?Please call with any questions/concerns.  ?  ?Thank you for the opportunity to participate in this patient's care. ?  ?Daphene Calamity, MSW ?[place]  ?[phone] ? ?

## 2021-05-25 NOTE — Progress Notes (Signed)
Mobility Specialist - Progress Note ? ? 05/25/21 1400  ?Mobility  ?Activity Transferred from chair to bed  ?Level of Assistance Standby assist, set-up cues, supervision of patient - no hands on  ?Assistive Device Front wheel walker  ?Distance Ambulated (ft) 2 ft  ?Activity Response Tolerated well  ?$Mobility charge 1 Mobility  ? ? ? ?Transferred chair-bed. Alarm set, needs in reach.  ? ? ?Filiberto Pinks ?Mobility Specialist ?05/25/21, 2:03 PM ? ? ? ? ?

## 2021-05-25 NOTE — Progress Notes (Addendum)
?PROGRESS NOTE ? ? ? ?Amy Calhoun  [id number] DOB: 10-01-1928 DOA: 05/23/2021 ?PCP: Alvester Chou, NP  ? ?Brief Narrative:  ?88 year old with history of CKD stage IIIa, DM2, chronic hypoxia on 1 L nasal cannula at home, CAD, depression, GERD, HTN, HLD, hypothyroidism admitted for shortness of breath and cough.  Patient had an endoscopy yesterday for esophageal stricture dilation.  Postprocedure patient started having nausea vomiting with significant hypoxia, chest x-ray showed concerns of aspiration pneumonia.  Initially started on IV Rocephin and azithromycin.  Thereafter antibiotics were switched to Unasyn, she was seen by speech and swallow therapy who recommended dysphagia 2 diet. ? ? ?Assessment & Plan: ? Principal Problem: ?  Aspiration pneumonia ([place]) ?Active Problems: ?  Acute kidney injury superimposed on chronic kidney disease ([place]) ?  Acute on chronic respiratory failure with hypoxia (HCC) ?  Uncontrolled type 2 diabetes mellitus with hyperglycemia, with long-term current use of insulin ([place]) ?  Acquired hypothyroidism ?  Essential hypertension ?  GERD (gastroesophageal reflux disease) ?  Coronary artery disease without angina pectoris ?  ? ? ?Assessment and Plan: ?* Aspiration pneumonia ([place]) ?-Likely postprocedure from nausea vomiting ?- Change antibiotics to IV Unasyn ?- As needed bronchodilators, I-S/flutter valve ? ?D2 diet. Seen by S&S ? ?Acute kidney injury superimposed on chronic kidney disease ([place]) ?-Baseline creatinine 1.0.  Admission creatinine 1.56.  Creatinine still trending upwards, today 1.8.  Continue IV fluids, monitor urine output.  Off-and-on she has had some urinary retention requiring bladder scan and straight in and out cath.. ? ?Acute on chronic respiratory failure with hypoxia (HCC) ?-Supplemental oxygen as needed ? 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  Will increase Semglee to 17 units at bedtime, add NovoLog 5 units 3 times daily premeals.  Continue sliding scale and Accu-Cheks. ? ?Acquired hypothyroidism ?-Synthroid ? ?Coronary artery disease without angina pectoris ?Chronic diastolic CHF, grade 2 DD ?-Currently chest pain-free.  Continue aspirin, Imdur, Zetia ?-Alcohol and discontinue losartan and Lasix ? ?GERD (gastroesophageal reflux disease) ?-PPI daily ? ?Essential hypertension ?-Continue Imdur, IV ? ?TOC working with family and hospice team for safe disposition planning ?Overall due to her chronic issues and debility she would benefit from continuing outpatient with hospice care. ? ?DVT prophylaxis: Lovenox ?Code Status: DNR ?Family Communication:  Daughter at bedside ? ?Status is: Inpatient ?Remains inpatient appropriate because: Maintain hospital stay due to rising creatinine, requires IV fluids due to poor oral intake.  In the meantime aggressive blood glucose control ? ? ? ?Subjective: ?Patient feels a lot better, sitting up in the chair.  Daughter is at bedside.  Seems slightly better today compared to yesterday. ? ?Examination: ?Constitutional: Not in acute distress. 2L [place]. Elderly frail.  ?Respiratory: Right lower base rhonchi, left side is clear to auscultation ?Cardiovascular: Normal sinus rhythm, no rubs ?Abdomen: Nontender nondistended good bowel sounds ?Musculoskeletal: No edema noted ?Skin: No rashes seen ?Neurologic: CN 2-12 grossly intact.  And nonfocal ?Psychiatric: Normal judgment and insight. Alert and oriented x 3. Normal mood.  ? ? ? ?Objective: ?Vitals:  ? 05/24/21 2011 05/24/21 2328 05/25/21 0215 05/25/21 0424  ?BP:  (!) 118/50  (!) 118/53  ?Pulse:  90  99  ?Resp:  19  18  ?Temp:  98.4 ?F (36.9 ?C)  99.3 ?F (37.4 ?C)  ?TempSrc:  Oral    ?SpO2: 96% 97% 92% 98%  ?Weight:      ?  Height:      ? ? ?Intake/Output Summary (Last 24 hours) at 05/25/2021 0736 ?Last data filed at 05/25/2021 0353 ?Gross per 24 hour  ?Intake  1324.94 ml  ?Output 650 ml  ?Net 674.94 ml  ? ?Filed Weights  ? 05/23/21 1854  ?Weight: 60.8 kg  ? ? ? ?Data Reviewed:  ? ?CBC: ?Recent Labs  ?Lab 05/23/21 ?0950 05/23/21 ?1945 05/24/21 ?[id number] 05/25/21 ?0407  ?WBC 8.6 4.0 8.3 10.9*  ?NEUTROABS  --  3.1  --   --   ?HGB 12.5 10.9* 9.6* 10.2*  ?HCT 39.2 34.7* 30.9* 32.8*  ?MCV 88.5 89.9 91.4 91.4  ?PLT 224 161 150 146*  ? ?Basic Metabolic Panel: ?Recent Labs  ?Lab 05/23/21 ?0950 05/23/21 ?1945 05/24/21 ?[id number] 05/25/21 ?0407  ?NA 136 137 139 135  ?K 5.0 3.8 3.9 4.2  ?CL 101 106 111 106  ?CO2 25 23 21* 22  ?GLUCOSE 354* 280* 381* 405*  ?BUN 44* 49* 47* 60*  ?CREATININE 1.38* 1.56* 1.63* 1.80*  ?CALCIUM 8.8* 7.8* 6.9* 7.5*  ?MG  --   --   --  2.2  ? ?GFR: ?Estimated Creatinine Clearance: 17.9 mL/min (A) (by C-G formula based on SCr of 1.8 mg/dL (H)). ?Liver Function Tests: ?Recent Labs  ?Lab 05/23/21 ?0950  ?AST 27  ?ALT 17  ?ALKPHOS 72  ?BILITOT 0.8  ?PROT 8.2*  ?ALBUMIN 4.3  ? ?Recent Labs  ?Lab 05/23/21 ?0950  ?LIPASE 24  ? ?No results for input(s): AMMONIA in the last 168 hours. ?Coagulation Profile: ?No results for input(s): INR, PROTIME in the last 168 hours. ?Cardiac Enzymes: ?No results for input(s): CKTOTAL, CKMB, CKMBINDEX, TROPONINI in the last 168 hours. ?BNP (last 3 results) ?No results for input(s): PROBNP in the last 8760 hours. ?HbA1C: ?No results for input(s): HGBA1C in the last 72 hours. ?CBG: ?Recent Labs  ?Lab 05/23/21 ?1315 05/23/21 ?2337 05/24/21 ?1228 05/24/21 ?1632 05/24/21 ?2117  ?GLUCAP 296* 309* 466* 437* 301*  ? ?Lipid Profile: ?No results for input(s): CHOL, HDL, LDLCALC, TRIG, CHOLHDL, LDLDIRECT in the last 72 hours. ?Thyroid Function Tests: ?No results for input(s): TSH, T4TOTAL, FREET4, T3FREE, THYROIDAB in the last 72 hours. ?Anemia Panel: ?No results for input(s): VITAMINB12, FOLATE, FERRITIN, TIBC, IRON, RETICCTPCT in the last 72 hours. ?Sepsis Labs: ?No results for input(s): PROCALCITON, LATICACIDVEN in the last 168 hours. ? ?Recent  Results (from the past 240 hour(s))  ?Resp Panel by RT-PCR (Flu A&B, Covid) Nasopharyngeal Swab     Status: None  ? Collection Time: 05/23/21 11:33 PM  ? Specimen: Nasopharyngeal Swab; Nasopharyngeal(NP) swabs in vial transport medium  ?Result Value Ref Range Status  ? SARS Coronavirus 2 by RT PCR NEGATIVE NEGATIVE Final  ?  Comment: (NOTE) ?SARS-CoV-2 target nucleic acids are NOT DETECTED. ? ?The SARS-CoV-2 RNA is generally detectable in upper respiratory ?specimens during the acute phase of infection. The lowest ?concentration of SARS-CoV-2 viral copies this assay can detect is ?138 copies/mL. A negative result does not preclude SARS-Cov-2 ?infection and should not be used as the sole basis for treatment or ?other patient management decisions. A negative result may occur with  ?improper specimen collection/handling, submission of specimen other ?than nasopharyngeal swab, presence of viral mutation(s) within the ?areas targeted by this assay, and inadequate number of viral ?copies(<138 copies/mL). A negative result must be combined with ?clinical observations, patient history, and epidemiological ?information. The expected result is Negative. ? ?Fact Sheet for Patients:  ?EntrepreneurPulse.com.au ? ?Fact Sheet for Healthcare Providers:  ?IncredibleEmployment.be ? ?This test  is no t yet approved or cleared by the [place] FDA and  ?has been authorized for detection and/or diagnosis of SARS-CoV-2 by ?FDA under an Emergency Use Authorization (EUA). This EUA will remain  ?in effect (meaning this test can be used) for the duration of the ?COVID-19 declaration under Section 564(b)(1) of the Act, 21 ?U.S.C.section 360bbb-3(b)(1), unless the authorization is terminated  ?or revoked sooner.  ? ? ?  ? Influenza A by PCR NEGATIVE NEGATIVE Final  ? Influenza B by PCR NEGATIVE NEGATIVE Final  ?  Comment: (NOTE) ?The Xpert Xpress SARS-CoV-2/FLU/RSV plus assay is intended as an aid ?in the  diagnosis of influenza from Nasopharyngeal swab specimens and ?should not be used as a sole basis for treatment. Nasal washings and ?aspirates are unacceptable for Xpert Xpress SARS-CoV-2/FLU/RSV ?testing. ? ?F

## 2021-05-25 NOTE — Evaluation (Signed)
Physical Therapy Evaluation ?Patient Details ?Name: Amy Calhoun ?MRN: [id number] ?DOB: 07/30/1928 ?Today's Date: 05/25/2021 ? ?History of Present Illness ? 88 year old with history of CKD stage IIIa, DM2, chronic hypoxia on 1 L nasal cannula at home, CAD, depression, GERD, HTN, HLD, hypothyroidism admitted for shortness of breath and cough.  Recent endoscopy for esophageal stricture dilation, postprocedure patient started having nausea vomiting with significant hypoxia, chest x-ray showed concerns of aspiration pneumonia.  ?Clinical Impression ? Pt pleasantly confused and eager to get up and work with PT. She did have fatigue and drop in O2 during the effort, but on 2L did quickly recover back to the 90s with only minimal c/o of transient fatigue and no overt SOB/DOE.  She did need AD and had one LOB during static standing but remained safe and relatively confident during bouts of ambulation.  Pt needs supervision for safety and awareness but with good relative functional mobility; O2/activity tolerance appears to be her biggest limiter  ?   ? ?Recommendations for follow up therapy are one component of a multi-disciplinary discharge planning process, led by the attending physician.  Recommendations may be updated based on patient status, additional functional criteria and insurance authorization. ? ?Follow Up Recommendations Home health PT ? ?  ?Assistance Recommended at Discharge Frequent or constant Supervision/Assistance  ?Patient can return home with the following ? A little help with bathing/dressing/bathroom;Direct supervision/assist for medications management;Assistance with cooking/housework;Direct supervision/assist for financial management;Assist for transportation ? ?  ?Equipment Recommendations None recommended by PT, RW if she does not have one?  ?Recommendations for Other Services ?    ?  ?Functional Status Assessment Patient has had a recent decline in their functional status and demonstrates the  ability to make significant improvements in function in a reasonable and predictable amount of time.  ? ?  ?Precautions / Restrictions Precautions ?Precautions: Fall ?Restrictions ?Weight Bearing Restrictions: No  ? ?  ? ?Mobility ? Bed Mobility ?Overal bed mobility: Modified Independent ?  ?  ?  ?  ?  ?  ?General bed mobility comments: Pt able to get to sitting EOB w/o assist, minimal rail use ?  ? ?Transfers ?Overall transfer level: Modified independent ?Equipment used: Rolling walker (2 wheels) ?  ?  ?  ?  ?  ?  ?  ?General transfer comment: Pt was able to rise from standard height bed w/o direct assist, UEs needed to rise and to maintain balance once standing ?  ? ?Ambulation/Gait ?Ambulation/Gait assistance: Min guard ?Gait Distance (Feet): 60 Feet ?Assistive device: Rolling walker (2 wheels) ?  ?  ?  ?  ?General Gait Details: 40 ft, then after rest break 60 ft.  Definite need of AD and plenty of directional cuing but pt able to maintain a surprisingly consistent cadence with seemingly good confidence.  on 2L O2 sats dropped to mid 80s but bounced back to the mid 90s relatively quickly after seated rest breaks ? ?Stairs ?  ?  ?  ?  ?  ? ?Wheelchair Mobility ?  ? ?Modified Rankin (Stroke Patients Only) ?  ? ?  ? ?Balance Overall balance assessment: Needs assistance ?Sitting-balance support: Bilateral upper extremity supported ?Sitting balance-Leahy Scale: Good ?  ?  ?Standing balance support: Bilateral upper extremity supported ?Standing balance-Leahy Scale: Fair ?Standing balance comment: Pt did have one near fall at EOB after having stood ~30 seconds, min assist to maintain ?  ?  ?  ?  ?  ?  ?  ?  ?  ?  ?  ?   ? ? ? ?  Pertinent Vitals/Pain Pain Assessment ?Pain Assessment: No/denies pain  ? ? ?Home Living Family/patient expects to be discharged to:: Private residence ?  ?  ?  ?  ?  ?  ?  ?  ?Home Equipment: Cane - single Barista (2 wheels) ?Additional Comments: pt reports she lives with children,  prior doc variously agrees with this (with some documentation about ALF?)  ?  ?Prior Function Prior Level of Function : Patient poor historian/Family not available ?  ?  ?  ?  ?  ?  ?Mobility Comments: reports she is able to ambulate with SPC, pt has been able to ambulate with walker and or cane on prior hospitalizations ?  ?  ? ? ?Hand Dominance  ?   ? ?  ?Extremity/Trunk Assessment  ? Upper Extremity Assessment ?Upper Extremity Assessment: Generalized weakness ?  ? ?Lower Extremity Assessment ?Lower Extremity Assessment: Generalized weakness ?  ? ?   ?Communication  ? Communication: No difficulties  ?Cognition Arousal/Alertness: Awake/alert ?Behavior During Therapy: [facility] for tasks assessed/performed ?Overall Cognitive Status: History of cognitive impairments - at baseline ?  ?  ?  ?  ?  ?  ?  ?  ?  ?  ?  ?  ?  ?  ?  ?  ?  ?  ?  ? ?  ?General Comments   ? ?  ?Exercises    ? ?Assessment/Plan  ?  ?PT Assessment Patient needs continued PT services  ?PT Problem List Decreased strength;Decreased range of motion;Decreased activity tolerance;Decreased balance;Decreased mobility;Decreased cognition;Decreased knowledge of use of DME;Decreased safety awareness;Cardiopulmonary status limiting activity ? ?   ?  ?PT Treatment Interventions DME instruction;Gait training;Stair training;Functional mobility training;Therapeutic activities;Therapeutic exercise;Balance training;Cognitive remediation;Patient/family education   ? ?PT Goals (Current goals can be found in the Care Plan section)  ?Acute Rehab PT Goals ?Patient Stated Goal: none stated ?PT Goal Formulation: With patient ?Time For Goal Achievement: 06/08/21 ?Potential to Achieve Goals: Good ? ?  ?Frequency Min 2X/week ?  ? ? ?Co-evaluation   ?  ?  ?  ?  ? ? ?  ?AM-PAC PT "6 Clicks" Mobility  ?Outcome Measure Help needed turning from your back to your side while in a flat bed without using bedrails?: None ?Help needed moving from lying on your back to sitting on the side of a  flat bed without using bedrails?: None ?Help needed moving to and from a bed to a chair (including a wheelchair)?: A Little ?Help needed standing up from a chair using your arms (e.g., wheelchair or bedside chair)?: A Little ?Help needed to walk in hospital room?: A Little ?Help needed climbing 3-5 steps with a railing? : A Lot ?6 Click Score: 19 ? ?  ?End of Session Equipment Utilized During Treatment: Gait belt ?Activity Tolerance: Patient tolerated treatment well ?Patient left: with call bell/phone within reach;with chair alarm set ?Nurse Communication: Mobility status ?PT Visit Diagnosis: Muscle weakness (generalized) (M62.81);Difficulty in walking, not elsewhere classified (R26.2);Unsteadiness on feet (R26.81) ?  ? ?Time: [id number] ?PT Time Calculation (min) (ACUTE ONLY): 29 min ? ? ?Charges:   PT Evaluation ?$PT Eval Low Complexity: 1 Low ?PT Treatments ?$Gait Training: 8-22 mins ?  ?   ? ? ?Kreg Shropshire, DPT ?05/25/2021, 7:12 PM ? ?

## 2021-05-25 NOTE — Progress Notes (Signed)
Mobility Specialist - Progress Note ? ? 05/25/21 1000  ?Mobility  ?Activity Ambulated with assistance in room;Transferred from bed to chair  ?Level of Assistance Standby assist, set-up cues, supervision of patient - no hands on  ?Assistive Device Front wheel walker  ?Distance Ambulated (ft) 5 ft  ?Activity Response Tolerated well  ?$Mobility charge 1 Mobility  ? ? ? ?Pre-mobility: 95 HR, 88-90% SpO2 ?During mobility: 100 HR, 80% SpO2 ?Post-mobility: 96 HR, 91% SpO2 ? ? ?Pt lying in bed for linen change upon arrival, utilizing 2L. Pt's daughter and NT at bedside. Daughter reports pt using SPC at baseline and having difficulty standing in recent days leading up to admission. Pt sat EOB with HHA and participated in LE therex prior to OOB activity. CGA to stand with cueing for upright posture. Pt took several lateral steps along bedside with supervision and vc for navigating with RW before transferring to recliner. VC for hand placement to sit. SOB---inconsistent with PLB technique. O2 desat to 80% with extensive recovery but no s/s of distress. O2 increased to 3L to improve sats > 90%. Pt left in chair with alarm set, needs in reach. RN notified.  ? ? ?Filiberto Pinks ?Mobility Specialist ?05/25/21, 10:54 AM ? ?

## 2021-05-26 ENCOUNTER — Inpatient Hospital Stay: Payer: Medicare Other

## 2021-05-26 DIAGNOSIS — N179 Acute kidney failure, unspecified: Secondary | ICD-10-CM | POA: Diagnosis not present

## 2021-05-26 DIAGNOSIS — J9621 Acute and chronic respiratory failure with hypoxia: Secondary | ICD-10-CM | POA: Diagnosis not present

## 2021-05-26 DIAGNOSIS — J690 Pneumonitis due to inhalation of food and vomit: Secondary | ICD-10-CM | POA: Diagnosis not present

## 2021-05-26 DIAGNOSIS — E039 Hypothyroidism, unspecified: Secondary | ICD-10-CM | POA: Diagnosis not present

## 2021-05-26 LAB — URINALYSIS, COMPLETE (UACMP) WITH MICROSCOPIC
Bacteria, UA: NONE SEEN
Bilirubin Urine: NEGATIVE
Glucose, UA: NEGATIVE mg/dL
Hgb urine dipstick: NEGATIVE
Ketones, ur: NEGATIVE mg/dL
Leukocytes,Ua: NEGATIVE
Nitrite: NEGATIVE
Protein, ur: 30 mg/dL — AB
Specific Gravity, Urine: 1.019 (ref 1.005–1.030)
pH: 5 (ref 5.0–8.0)

## 2021-05-26 LAB — CBC
HCT: 29.9 % — ABNORMAL LOW (ref 36.0–46.0)
Hemoglobin: 9.6 g/dL — ABNORMAL LOW (ref 12.0–15.0)
MCH: 28.4 pg (ref 26.0–34.0)
MCHC: 32.1 g/dL (ref 30.0–36.0)
MCV: 88.5 fL (ref 80.0–100.0)
Platelets: 151 10*3/uL (ref 150–400)
RBC: 3.38 MIL/uL — ABNORMAL LOW (ref 3.87–5.11)
RDW: 13.4 % (ref 11.5–15.5)
WBC: 10.7 10*3/uL — ABNORMAL HIGH (ref 4.0–10.5)
nRBC: 0 % (ref 0.0–0.2)

## 2021-05-26 LAB — BASIC METABOLIC PANEL
Anion gap: 8 (ref 5–15)
BUN: 56 mg/dL — ABNORMAL HIGH (ref 8–23)
CO2: 21 mmol/L — ABNORMAL LOW (ref 22–32)
Calcium: 7.7 mg/dL — ABNORMAL LOW (ref 8.9–10.3)
Chloride: 106 mmol/L (ref 98–111)
Creatinine, Ser: 1.52 mg/dL — ABNORMAL HIGH (ref 0.44–1.00)
GFR, Estimated: 32 mL/min — ABNORMAL LOW (ref >60)
Glucose, Bld: 287 mg/dL — ABNORMAL HIGH (ref 70–99)
Potassium: 4.2 mmol/L (ref 3.5–5.1)
Sodium: 135 mmol/L (ref 135–145)

## 2021-05-26 LAB — GLUCOSE, CAPILLARY
Glucose-Capillary: 307 mg/dL — ABNORMAL HIGH (ref 70–99)
Glucose-Capillary: 190 mg/dL — ABNORMAL HIGH (ref 70–99)
Glucose-Capillary: 185 mg/dL — ABNORMAL HIGH (ref 70–99)
Glucose-Capillary: 177 mg/dL — ABNORMAL HIGH (ref 70–99)

## 2021-05-26 LAB — MAGNESIUM: Magnesium: 2.2 mg/dL (ref 1.7–2.4)

## 2021-05-26 MED ORDER — FUROSEMIDE 10 MG/ML IJ SOLN
40.0000 mg | Freq: Once | INTRAMUSCULAR | Status: AC
  Administered 2021-05-26: 40 mg via INTRAVENOUS
  Filled 2021-05-26: qty 4

## 2021-05-26 MED ORDER — PIPERACILLIN-TAZOBACTAM 3.375 G IVPB
3.3750 g | Freq: Three times a day (TID) | INTRAVENOUS | Status: DC
  Administered 2021-05-26 – 2021-05-29 (×9): 3.375 g via INTRAVENOUS
  Filled 2021-05-26 (×9): qty 50

## 2021-05-26 NOTE — Progress Notes (Signed)
Patient with increased WOB and increased oxygen requirement to 5 L via [place] humidified oxygen.  Education given to patient to limit mouth breathing to use nasal canula oxygen.  Patient remains 92-93%on 5 L.  MD notified and new orders given.  Will continue to montior ?

## 2021-05-26 NOTE — Care Management Important Message (Signed)
Important Message ? ?Patient Details  ?Name: Amy Calhoun ?MRN: [id number] ?Date of Birth: 1928/01/23 ? ? ?Medicare Important Message Given:  Other (see comment) ? ?Current disposition to discharge with hospice services.  Medicare IM withheld at this time out of respect for patient and family. ? ? ?[name] ?05/26/2021, 9:28 AM ?

## 2021-05-26 NOTE — TOC Initial Note (Signed)
Transition of Care (TOC) - Initial/Assessment Note  ? ? ?Patient Details  ?Name: Amy Calhoun ?MRN: [id number] ?Date of Birth: 15-Jun-1928 ? ?Transition of Care (TOC) CM/SW Contact:    ?Chapman Fitch, RN ?Phone Number: ?05/26/2021, 10:09 AM ? ?Clinical Narrative:                 ?Per TOC hand off anticipated discharge home with hospice services ? ?Confirmed with Watt Climes from [employer] that patient will return home with daughter with hospice services.  Patient has all needed DME, and daughter will transport via car at discharge  ? ?Expected Discharge Plan: Home w Hospice Care ?Barriers to Discharge: Continued Medical Work up ? ? ?Patient Goals and CMS Choice ?  ?  ?  ? ?Expected Discharge Plan and Services ?Expected Discharge Plan: Home w Hospice Care ?  ?  ?  ?  ?                ?  ?  ?  ?  ?  ?  ?  ?  ?  ?  ? ?Prior Living Arrangements/Services ?  ?  ?  ?       ?  ?  ?  ?  ? ?Activities of Daily Living ?Home Assistive Devices/Equipment: Dan Humphreys (specify type), Cane (specify quad or straight), Wheelchair, [employer], Hearing aid ?ADL Screening (condition at time of admission) ?Patient's cognitive ability adequate to safely complete daily activities?: Yes ?Is the patient deaf or have difficulty hearing?: No ?Does the patient have difficulty seeing, even when wearing glasses/contacts?: No ?Does the patient have difficulty concentrating, remembering, or making decisions?: No ?Patient able to express need for assistance with ADLs?: Yes ?Does the patient have difficulty dressing or bathing?: No ?Independently performs ADLs?: No ?Communication: Independent ?Dressing (OT): Needs assistance ?Is this a change from baseline?: Change from baseline, expected to last <3days ?Grooming: Independent ?Feeding: Independent ?Bathing: Needs assistance ?Is this a change from baseline?: Pre-admission baseline ?Toileting: Needs assistance ?Is this a change from baseline?: Pre-admission baseline ?In/Out Bed: Needs  assistance ?Is this a change from baseline?: Pre-admission baseline ?Walks in Home: Needs assistance ?Is this a change from baseline?: Pre-admission baseline ?Does the patient have difficulty walking or climbing stairs?: Yes ?Weakness of Legs: Both ?Weakness of Arms/Hands: None ? ?Permission Sought/Granted ?  ?  ?   ?   ?   ?   ? ?Emotional Assessment ?  ?  ?  ?  ?Alcohol / Substance Use: Not Applicable ?Psych Involvement: No (comment) ? ?Admission diagnosis:  Aspiration pneumonia (HCC) [J69.0] ?Acute on chronic respiratory failure with hypoxia (HCC) [J96.21] ?Aspiration pneumonia of left lower lobe, unspecified aspiration pneumonia type (HCC) [J69.0] ?Patient Active Problem List  ? Diagnosis Date Noted  ? Aspiration pneumonia (HCC) 05/23/2021  ? Acute kidney injury superimposed on chronic kidney disease (HCC) 05/23/2021  ? Acute on chronic respiratory failure with hypoxia (HCC) 05/23/2021  ? Uncontrolled type 2 diabetes mellitus with hyperglycemia, with long-term current use of insulin (HCC) 05/23/2021  ? Coronary artery disease without angina pectoris 05/23/2021  ? Acquired hypothyroidism 01/26/2021  ? Pressure injury of skin 12/28/2020  ? Chronic heart failure with preserved ejection fraction (HFpEF) (HCC)   ? NSTEMI (non-ST elevated myocardial infarction) (HCC) 12/27/2020  ? Acute on chronic diastolic heart failure (HCC) 12/27/2020  ? Pacemaker 12/27/2020  ? Bronchitis 12/21/2020  ? Pressure ulcer 12/21/2020  ? Polypharmacy 12/07/2020  ? Coronary artery disease of native artery of native heart with stable angina  pectoris ([place]) 11/18/2020  ? Acute on chronic diastolic CHF (congestive heart failure) ([place]) 10/15/2020  ? Malnutrition of moderate degree 10/12/2020  ? Heart block AV complete ([place])   ? Symptomatic bradycardia 10/10/2020  ? Syncope 10/10/2020  ? Redness of skin 06/29/2020  ? Recurrent falls 06/06/2020  ? Abnormal gait 06/06/2020  ? Dizziness 06/06/2020  ? Right wrist pain 03/18/2020  ? B12 deficiency  03/11/2020  ? Hyperglycemia due to type 2 diabetes mellitus ([place]) 11/16/2019  ? Type 2 diabetes mellitus with stage 3a chronic kidney disease, with long-term current use of insulin ([place]) 11/16/2019  ? DM type 2 with diabetic peripheral neuropathy ([place]) 11/16/2019  ? Depression   ? Weakness of both lower extremities   ? Fatigue 02/13/2019  ? Head injury 02/04/2019  ? Acute hypoxemic respiratory failure due to COVID-19 [facility]) 01/28/2019  ? GERD (gastroesophageal reflux disease)   ? Insomnia 01/05/2019  ? Constipation 06/02/2018  ? Acute pain of left shoulder 05/23/2018  ? Diarrhea 05/23/2018  ? Pneumatosis coli 07/29/2017  ? 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  ? Memory disturbance 03/27/2017  ? Recurrent urinary tract infection 10/17/2016  ? Lumbar stenosis with neurogenic claudication 08/10/2016  ? Acute respiratory failure with hypoxia ([place]) 07/12/2016  ? Anemia 07/06/2016  ? AKI (acute kidney injury) ([place]) 04/05/2016  ? SOB (shortness of breath) 03/31/2016  ? Osteoarthritis of spine with radiculopathy, lumbar region 03/16/2016  ? History of colitis 03/02/2016  ? Primary osteoarthritis of left hip 03/02/2016  ? CKD (chronic kidney disease) stage 3, GFR 30-59 ml/min (HCC) 02/21/2016  ? DM (diabetes mellitus), type 2 with renal complications ([place]) [id number]  ? Hyperlipidemia associated with type 2 diabetes mellitus ([place]) 01/19/2016  ? Hypothyroidism 01/19/2016  ? Essential hypertension 01/19/2016  ? Chronic pain 01/19/2016  ? Esophageal dysmotility 12/21/2015  ? ?PCP:  Alvester Chou, NP ?Pharmacy:   ?[id number] [address] ?[place] ?[place] [address] ?Phone: [phone] Fax: [phone] ? ?[facility] [id number] [address] ?[place] ?[place] [address] ?Phone: [phone] Fax: [phone] ? ? ? ? ?Social Determinants of Health (SDOH) Interventions ?  ? ?Readmission Risk Interventions ?   ? View : No data to display.  ?  ?  ?  ? ? ? ?

## 2021-05-26 NOTE — Progress Notes (Signed)
?PROGRESS NOTE ? ? ? ?Amy Calhoun  [id number] DOB: Nov 27, 1928 DOA: 05/23/2021 ?PCP: Alvester Chou, NP  ? ?Brief Narrative:  ?88 year old with history of CKD stage IIIa, DM2, chronic hypoxia on 1 L nasal cannula at home, CAD, depression, GERD, HTN, HLD, hypothyroidism admitted for shortness of breath and cough.  Patient had an endoscopy yesterday for esophageal stricture dilation.  Postprocedure patient started having nausea vomiting with significant hypoxia, chest x-ray showed concerns of aspiration pneumonia.  Initially started on IV Rocephin and azithromycin.  Thereafter antibiotics were switched to Unasyn, she was seen by speech and swallow therapy who recommended dysphagia 2 diet.  But developed worsening of pneumonia therefore she had to be made n.p.o. due to concerns of recurrent aspiration. ? ? ?Assessment & Plan: ? Principal Problem: ?  Aspiration pneumonia ([place]) ?Active Problems: ?  Acute kidney injury superimposed on chronic kidney disease ([place]) ?  Acute on chronic respiratory failure with hypoxia (HCC) ?  Uncontrolled type 2 diabetes mellitus with hyperglycemia, with long-term current use of insulin ([place]) ?  Acquired hypothyroidism ?  Essential hypertension ?  GERD (gastroesophageal reflux disease) ?  Coronary artery disease without angina pectoris ?  ? ? ?Assessment and Plan: ?* Aspiration pneumonia ([place]) ?- Currently there is concerns of recurrent aspiration.  CT chest now shows multifocal pneumonia.  Continue IV Zosyn, bronchodilators.  We will give 1 dose of IV Lasix to keep net balance negative.  She is made NPO.  May require esophagram ?-UA-negative ? ?Esophageal narrowing ?- CT shows debris/food in esophagus with narrowing.  Discussed with GI, continue monitoring for now.  She has been made NPO. ? ?Acute kidney injury superimposed on chronic kidney disease ([place]) ?-Baseline creatinine 1.0.  Creatinine remains around 1.5.  We will have to give her 1 dose of IV Lasix, stop IV fluids. ? ?Acute on  chronic respiratory failure with hypoxia (HCC) ?-Supplemental oxygen as needed ? ?Uncontrolled type 2 diabetes mellitus with hyperglycemia, with long-term current use of insulin ([place]) ?Peripheral neuropathy secondary to DM2 ?- Blood glucose remains uncontrolled.  Semglee to 17 units at bedtime, NovoLog 5 units 3 times daily premeals.  Continue sliding scale and Accu-Cheks. ? ?Acquired hypothyroidism ?-Synthroid ? ?Coronary artery disease without angina pectoris ?Chronic diastolic CHF, grade 2 DD ?-Currently chest pain-free.  Continue aspirin, Imdur, Zetia ?-Alcohol and discontinue losartan and Lasix ? ?GERD (gastroesophageal reflux disease) ?-PPI daily ? ?Essential hypertension ?-Continue Imdur, IV ? ?Patient and family discussed care with hospice, they will resume services upon discharge ? ?DVT prophylaxis: Lovenox ?Code Status: DNR ?Family Communication: Daughter has been updated ? ?Status is: Inpatient ?Remains inpatient appropriate because: Maintain hospital stay due to worsening pneumonia requiring aggressive antibiotics. ? ? ?Subjective: ?Overnight patient was febrile.  Slightly more dyspneic appearing today. ? ?Examination: ?Constitutional: Elderly frail.  Chronically ill-appearing, 5 L nasal cannula ?Respiratory: Bilateral rhonchi ?Cardiovascular: Normal sinus rhythm, no rubs ?Abdomen: Nontender nondistended good bowel sounds ?Musculoskeletal: No edema noted ?Skin: No rashes seen ?Neurologic: CN 2-12 grossly intact.  And nonfocal ?Psychiatric: Normal judgment and insight. Alert and oriented x 3. Normal mood. ? ?Objective: ?Vitals:  ? 05/26/21 0406 05/26/21 0725 05/26/21 [id number] 05/26/21 [id number]  ?BP: (!) 144/61 (!) 155/71    ?Pulse: 91 94 (!) 103 99  ?Resp: 19 18  18   ?Temp: (!) 100.8 ?F (38.2 ?C) (!) 100.5 ?F (38.1 ?C)    ?TempSrc:      ?SpO2: 95% 93% 92% 93%  ?Weight:      ?Height:      ? ? ?  Intake/Output Summary (Last 24 hours) at 05/26/2021 1246 ?Last data filed at 05/26/2021 1009 ?Gross per 24 hour  ?Intake  1633.46 ml  ?Output 300 ml  ?Net 1333.46 ml  ? ?Filed Weights  ? 05/23/21 1854  ?Weight: 60.8 kg  ? ? ? ?Data Reviewed:  ? ?CBC: ?Recent Labs  ?Lab 05/23/21 ?0950 05/23/21 ?1945 05/24/21 ?[id number] 05/25/21 ?0407 05/26/21 ?[id number]  ?WBC 8.6 4.0 8.3 10.9* 10.7*  ?NEUTROABS  --  3.1  --   --   --   ?HGB 12.5 10.9* 9.6* 10.2* 9.6*  ?HCT 39.2 34.7* 30.9* 32.8* 29.9*  ?MCV 88.5 89.9 91.4 91.4 88.5  ?PLT 224 161 150 146* 151  ? ?Basic Metabolic Panel: ?Recent Labs  ?Lab 05/23/21 ?0950 05/23/21 ?1945 05/24/21 ?[id number] 05/25/21 ?0407 05/26/21 ?[id number]  ?NA 136 137 139 135 135  ?K 5.0 3.8 3.9 4.2 4.2  ?CL 101 106 111 106 106  ?CO2 25 23 21* 22 21*  ?GLUCOSE 354* 280* 381* 405* 287*  ?BUN 44* 49* 47* 60* 56*  ?CREATININE 1.38* 1.56* 1.63* 1.80* 1.52*  ?CALCIUM 8.8* 7.8* 6.9* 7.5* 7.7*  ?MG  --   --   --  2.2 2.2  ? ?GFR: ?Estimated Creatinine Clearance: 21.2 mL/min (A) (by C-G formula based on SCr of 1.52 mg/dL (H)). ?Liver Function Tests: ?Recent Labs  ?Lab 05/23/21 ?0950  ?AST 27  ?ALT 17  ?ALKPHOS 72  ?BILITOT 0.8  ?PROT 8.2*  ?ALBUMIN 4.3  ? ?Recent Labs  ?Lab 05/23/21 ?0950  ?LIPASE 24  ? ?No results for input(s): AMMONIA in the last 168 hours. ?Coagulation Profile: ?No results for input(s): INR, PROTIME in the last 168 hours. ?Cardiac Enzymes: ?No results for input(s): CKTOTAL, CKMB, CKMBINDEX, TROPONINI in the last 168 hours. ?BNP (last 3 results) ?No results for input(s): PROBNP in the last 8760 hours. ?HbA1C: ?No results for input(s): HGBA1C in the last 72 hours. ?CBG: ?Recent Labs  ?Lab 05/25/21 ?1139 05/25/21 ?1703 05/25/21 ?2112 05/26/21 ?[id number] 05/26/21 ?1148  ?GLUCAP 311* 310* 275* 307* 190*  ? ?Lipid Profile: ?No results for input(s): CHOL, HDL, LDLCALC, TRIG, CHOLHDL, LDLDIRECT in the last 72 hours. ?Thyroid Function Tests: ?No results for input(s): TSH, T4TOTAL, FREET4, T3FREE, THYROIDAB in the last 72 hours. ?Anemia Panel: ?No results for input(s): VITAMINB12, FOLATE, FERRITIN, TIBC, IRON, RETICCTPCT in the last 72  hours. ?Sepsis Labs: ?No results for input(s): PROCALCITON, LATICACIDVEN in the last 168 hours. ? ?Recent Results (from the past 240 hour(s))  ?Resp Panel by RT-PCR (Flu A&B, Covid) Nasopharyngeal Swab     Status: None  ? Collection Time: 05/23/21 11:33 PM  ? Specimen: Nasopharyngeal Swab; Nasopharyngeal(NP) swabs in vial transport medium  ?Result Value Ref Range Status  ? SARS Coronavirus 2 by RT PCR NEGATIVE NEGATIVE Final  ?  Comment: (NOTE) ?SARS-CoV-2 target nucleic acids are NOT DETECTED. ? ?The SARS-CoV-2 RNA is generally detectable in upper respiratory ?specimens during the acute phase of infection. The lowest ?concentration of SARS-CoV-2 viral copies this assay can detect is ?138 copies/mL. A negative result does not preclude SARS-Cov-2 ?infection and should not be used as the sole basis for treatment or ?other patient management decisions. A negative result may occur with  ?improper specimen collection/handling, submission of specimen other ?than nasopharyngeal swab, presence of viral mutation(s) within the ?areas targeted by this assay, and inadequate number of viral ?copies(<138 copies/mL). A negative result must be combined with ?clinical observations, patient history, and epidemiological ?information. The expected result is Negative. ? ?Fact Sheet for Patients:  ?  EntrepreneurPulse.com.au ? ?Fact Sheet for Healthcare Providers:  ?IncredibleEmployment.be ? ?This test is no t yet approved or cleared by the [place] FDA and  ?has been authorized for detection and/or diagnosis of SARS-CoV-2 by ?FDA under an Emergency Use Authorization (EUA). This EUA will remain  ?in effect (meaning this test can be used) for the duration of the ?COVID-19 declaration under Section 564(b)(1) of the Act, 21 ?U.S.C.section 360bbb-3(b)(1), unless the authorization is terminated  ?or revoked sooner.  ? ? ?  ? Influenza A by PCR NEGATIVE NEGATIVE Final  ? Influenza B by PCR NEGATIVE  NEGATIVE Final  ?  Comment: (NOTE) ?The Xpert Xpress SARS-CoV-2/FLU/RSV plus assay is intended as an aid ?in the diagnosis of influenza from Nasopharyngeal swab specimens and ?should not be used as a sole basis for trea

## 2021-05-26 NOTE — Progress Notes (Signed)
Physical Therapy Treatment ?Patient Details ?Name: Amy Calhoun ?MRN: [id number] ?DOB: 1928/10/21 ?Today's Date: 05/26/2021 ? ? ?History of Present Illness 88 year old with history of CKD stage IIIa, DM2, chronic hypoxia on 1 L nasal cannula at home, CAD, depression, GERD, HTN, HLD, hypothyroidism admitted for shortness of breath and cough.  Recent endoscopy for esophageal stricture dilation, postprocedure patient started having nausea vomiting with significant hypoxia, chest x-ray showed concerns of aspiration pneumonia. ? ?  ?PT Comments  ? ? Pt seen for PT tx with pt's daughter [name]) present throughout. Pt is able to complete bed mobility without assistance, min assist for STS without AD. Pt is able to ambulate into hallway with RW & min assist, appearing to have RLE during gait. Pt encouraged to sit up in recliner as much as possible. ? ?Pt received & left on 5L/min via nasal cannula ?Pt increased to 6L/min via nasal cannula during gait ?Lowest SPO2 87% after gait but increases to >/= 90% with cuing for pursed lip breathing ?  ?Recommendations for follow up therapy are one component of a multi-disciplinary discharge planning process, led by the attending physician.  Recommendations may be updated based on patient status, additional functional criteria and insurance authorization. ? ?Follow Up Recommendations ? Home health PT ?  ?  ?Assistance Recommended at Discharge Frequent or constant Supervision/Assistance  ?Patient can return home with the following A little help with bathing/dressing/bathroom;Direct supervision/assist for medications management;Assistance with cooking/housework;Direct supervision/assist for financial management;Assist for transportation ?  ?Equipment Recommendations ?    ?  ?Recommendations for Other Services   ? ? ?  ?Precautions / Restrictions Precautions ?Precautions: Fall ?Restrictions ?Weight Bearing Restrictions: No  ?  ? ?Mobility ? Bed Mobility ?Overal bed mobility: Modified  Independent ?  ?  ?  ?  ?  ?  ?General bed mobility comments: supine>sit with HOB slightly elevated, bed rails PRN ?  ? ?Transfers ?Overall transfer level: Modified independent ?Equipment used: Rolling walker (2 wheels), None ?  ?  ?  ?  ?  ?  ?  ?General transfer comment: CGA when transferring STS without AD ?  ? ?Ambulation/Gait ?Ambulation/Gait assistance: Min assist ?Gait Distance (Feet): 85 Feet ?Assistive device: Rolling walker (2 wheels) ?Gait Pattern/deviations: Decreased step length - left, Decreased step length - right, Decreased dorsiflexion - right ?Gait velocity: decreased ?  ?  ?General Gait Details: Appears to have RLE weakness during gait but improves as pt with less buckling noted in RLE as gait progresses ? ? ?Stairs ?  ?  ?  ?  ?  ? ? ?Wheelchair Mobility ?  ? ?Modified Rankin (Stroke Patients Only) ?  ? ? ?  ?Balance Overall balance assessment: Needs assistance ?Sitting-balance support: Bilateral upper extremity supported ?Sitting balance-Leahy Scale: Good ?  ?  ?Standing balance support: No upper extremity supported, During functional activity ?Standing balance-Leahy Scale: Poor ?Standing balance comment: Pt requires min assist for standing to change into new brief with assistance. ?  ?  ?  ?  ?  ?  ?  ?  ?  ?  ?  ?  ? ?  ?Cognition Arousal/Alertness: Awake/alert ?Behavior During Therapy: [facility] for tasks assessed/performed ?Overall Cognitive Status: History of cognitive impairments - at baseline ?  ?  ?  ?  ?  ?  ?  ?  ?  ?  ?  ?  ?  ?  ?  ?  ?General Comments: appears HOH ?  ?  ? ?  ?Exercises   ? ?  ?  General Comments   ?  ?  ? ?Pertinent Vitals/Pain Pain Assessment ?Pain Assessment: No/denies pain  ? ? ?Home Living   ?  ?  ?  ?  ?  ?  ?  ?  ?  ?   ?  ?Prior Function    ?  ?  ?   ? ?PT Goals (current goals can now be found in the care plan section) Acute Rehab PT Goals ?Patient Stated Goal: none stated ?PT Goal Formulation: With patient ?Time For Goal Achievement: 06/08/21 ?Potential to Achieve  Goals: Good ?Progress towards PT goals: Progressing toward goals ? ?  ?Frequency ? ? ? Min 2X/week ? ? ? ?  ?PT Plan Current plan remains appropriate  ? ? ?Co-evaluation   ?  ?  ?  ?  ? ?  ?AM-PAC PT "6 Clicks" Mobility   ?Outcome Measure ? Help needed turning from your back to your side while in a flat bed without using bedrails?: None ?Help needed moving from lying on your back to sitting on the side of a flat bed without using bedrails?: None ?Help needed moving to and from a bed to a chair (including a wheelchair)?: A Little ?Help needed standing up from a chair using your arms (e.g., wheelchair or bedside chair)?: A Little ?Help needed to walk in hospital room?: A Little ?Help needed climbing 3-5 steps with a railing? : A Lot ?6 Click Score: 19 ? ?  ?End of Session Equipment Utilized During Treatment: Oxygen ?Activity Tolerance: Patient tolerated treatment well ?Patient left: in chair;with call bell/phone within reach;with family/visitor present ?Nurse Communication: Mobility status ?PT Visit Diagnosis: Muscle weakness (generalized) (M62.81);Difficulty in walking, not elsewhere classified (R26.2);Unsteadiness on feet (R26.81) ?  ? ? ?Time: [id number] ?PT Time Calculation (min) (ACUTE ONLY): 23 min ? ?Charges:  $Therapeutic Activity: 23-37 mins          ?          ? ?Lavone Nian, PT, DPT ?05/26/21, 2:59 PM ? ? ? ?Waunita Schooner ?05/26/2021, 2:58 PM ? ?

## 2021-05-26 NOTE — Progress Notes (Signed)
Mobility Specialist - Progress Note ? ? 05/26/21 1600  ?Mobility  ?Activity Ambulated with assistance in room;Transferred from chair to bed  ?Level of Assistance Standby assist, set-up cues, supervision of patient - no hands on  ?Assistive Device None  ?Distance Ambulated (ft) 2 ft  ?Activity Response Tolerated well  ?$Mobility charge 1 Mobility  ? ? ? ?Pt transferred chair-bed with HHA. Impulsive. Does voice back pain. O2 mid 80s on 3.5L with transfer, increased to 91% once supine. Alarm set, needs in reach. RN notified.  ? ? ?Kathee Delton ?Mobility Specialist ?05/26/21, 4:57 PM ? ? ? ? ?

## 2021-05-26 NOTE — Progress Notes (Signed)
[place]) Hospital Liaison Note  ?  ?[place] liaison continues to follow peripherally for any needs pending discharge disposition.  ?  ?Please do not hesitate to reach out for any hospice related questions or concerns.  ?  ?Thank you for the opportunity to participate in this patient's care. ?  ?Daphene Calamity, MSW ?[place]  ?[phone] ?

## 2021-05-27 DIAGNOSIS — I10 Essential (primary) hypertension: Secondary | ICD-10-CM

## 2021-05-27 LAB — CBC
HCT: 31.8 % — ABNORMAL LOW (ref 36.0–46.0)
Hemoglobin: 10.1 g/dL — ABNORMAL LOW (ref 12.0–15.0)
MCH: 28.3 pg (ref 26.0–34.0)
MCHC: 31.8 g/dL (ref 30.0–36.0)
MCV: 89.1 fL (ref 80.0–100.0)
Platelets: 168 10*3/uL (ref 150–400)
RBC: 3.57 MIL/uL — ABNORMAL LOW (ref 3.87–5.11)
RDW: 13.5 % (ref 11.5–15.5)
WBC: 9 10*3/uL (ref 4.0–10.5)
nRBC: 0 % (ref 0.0–0.2)

## 2021-05-27 LAB — GLUCOSE, CAPILLARY
Glucose-Capillary: 288 mg/dL — ABNORMAL HIGH (ref 70–99)
Glucose-Capillary: 254 mg/dL — ABNORMAL HIGH (ref 70–99)
Glucose-Capillary: 145 mg/dL — ABNORMAL HIGH (ref 70–99)
Glucose-Capillary: 143 mg/dL — ABNORMAL HIGH (ref 70–99)

## 2021-05-27 LAB — BASIC METABOLIC PANEL
Anion gap: 11 (ref 5–15)
BUN: 52 mg/dL — ABNORMAL HIGH (ref 8–23)
CO2: 23 mmol/L (ref 22–32)
Calcium: 8 mg/dL — ABNORMAL LOW (ref 8.9–10.3)
Chloride: 107 mmol/L (ref 98–111)
Creatinine, Ser: 1.56 mg/dL — ABNORMAL HIGH (ref 0.44–1.00)
GFR, Estimated: 31 mL/min — ABNORMAL LOW (ref >60)
Glucose, Bld: 303 mg/dL — ABNORMAL HIGH (ref 70–99)
Potassium: 4.1 mmol/L (ref 3.5–5.1)
Sodium: 141 mmol/L (ref 135–145)

## 2021-05-27 LAB — MAGNESIUM: Magnesium: 2.5 mg/dL — ABNORMAL HIGH (ref 1.7–2.4)

## 2021-05-27 MED ORDER — AMITRIPTYLINE HCL 75 MG PO TABS
75.0000 mg | ORAL_TABLET | Freq: Every day | ORAL | Status: DC
  Administered 2021-05-27 – 2021-05-29 (×3): 75 mg via ORAL
  Filled 2021-05-27 (×3): qty 1

## 2021-05-27 MED ORDER — HALOPERIDOL LACTATE 5 MG/ML IJ SOLN
2.5000 mg | Freq: Once | INTRAMUSCULAR | Status: AC
  Administered 2021-05-27: 2.5 mg via INTRAVENOUS
  Filled 2021-05-27: qty 1

## 2021-05-27 NOTE — Progress Notes (Signed)
?PROGRESS NOTE ? ? ? ?[place]  [id number] DOB: 1928/06/24  ?DOA: 05/23/2021 ?Date of Service: 05/27/21 ?PCP: Alvester Chou, NP ? ? ?Brief Narrative / Hospital Course:  ?To ED 05/23/21 initially in AM for dysphagia w/ N/V, underwent EGD which demonstrated fluid in lower third of esophagus, excessive gastric fluid, no specimens collected, patient tolerated the procedure poorly due to respiratory instability with hypoxia however recovered in PACU and was discharged home.  Since being home, experience worsening shortness of breath, dyspnea on exertion, cough and presented to the ED again in the evening 05/23/2021.Chest x-ray showed extensive airspace disease throughout left lung, concerning for aspiration pneumonia.  Treated with Rocephin, Zithromax, nebulized albuterol.  Admitted for treatment of aspiration pneumonia and AKI on stage IIIa CKD, acute on chronic respiratory failure with hypoxia. 05/10: Antibiotics were changed to Unasyn, seen by speech therapy, started back on diet, however worsening respiratory status 05/11 and made n.p.o. again due to concerns of recurrent aspiration. 05/12 CT chest concerning for multifocal pneumonia, bilateral lung opacities left greater than right, concern for mediastinal adenopathy, esophagus possibly dilated/filled with food debris, obstruction could not be excluded.  Spoke with GI today, she is high risk for another EGD/anesthesia, recommended esophagram in another 24 to 48 hours, agree that she should be n.p.o. however family insists that she is tolerating medications crushed in pudding, discussed directly with daughter regarding risk of aspiration even if she is tolerating it now, daughter believes that this risk is acceptable ? ?Consultants:  ?GI ? ?Procedures: ?EGD 05/23/2021 (prior to admission)  ? ? ? ?Subjective: ?Patient reports no complaints today. History obtained from family -history of recurrent aspirations, she is on hospice at home for end-stage heart disease  and COPD.  ? ? ? ? ?ASSESSMENT & PLAN: ?  ?Principal Problem: ?  Aspiration pneumonia ([place]) ?Active Problems: ?  Essential hypertension ?  GERD (gastroesophageal reflux disease) ?  Acquired hypothyroidism ?  Acute kidney injury superimposed on chronic kidney disease ([place]) ?  Acute on chronic respiratory failure with hypoxia (HCC) ?  Uncontrolled type 2 diabetes mellitus with hyperglycemia, with long-term current use of insulin ([place]) ?  Coronary artery disease without angina pectoris ? ? ?Aspiration pneumonia ([place]) ?IV Rocephin and Zithromax. ?mucolytic's as well as bronchodilator therapy. ? follow blood cultures. ?patient n.p.o. except medications and obtain speech therapy consult -  family daughter who is HCPOA) reports that she is tolerating medications crushed in pudding, discussed directly with daughter 05/27/21 regarding risk of aspiration even if she is tolerating it now, daughter believes that this risk is acceptable even if it causes or hastens death  ? ? ?Acquired hypothyroidism ?continue Synthroid. ? ?Acute kidney injury superimposed on chronic kidney disease ([place]) ?AKI superimposed on stage IIIa chronic kidney disease. ?IV normal saline. ? follow BMP. ? hold off nephrotoxins as able ? ?Acute on chronic respiratory failure with hypoxia (HCC) ?Supplemental O2  ? ?Uncontrolled type 2 diabetes mellitus with hyperglycemia, with long-term current use of insulin ([place]) ?Basal coverage + SSI ? ?Essential hypertension ?holding off Cozaar given acute kidney injury ?prn IV labetalol. ? ?Coronary artery disease without angina pectoris ?as needed sublingual nitroglycerin and Zetia. ? ?GERD (gastroesophageal reflux disease) ?PPI continued ? ? ? ?DVT prophylaxis: lovenox ?Code Status: DNR ?Family Communication: Granddaughter is at bedside this morning, spoke with daughter on the phone this morning with granddaughter, again this afternoon ?Disposition Plan / TOC needs: Pt has recommended HH PT ?Barriers to discharge /  significant pending items: esophogram tomorrow  per GI recommendations.  Pending those results, may need to discuss with family how we are going to provide nutrition, would patient have wanted invasive feeding tubes, realistic that these devices are also not reliable at preventing aspiration ? ? ? ? ? ? ? ? ? ? ? ? ?Objective: ?Vitals:  ? 05/26/21 2014 05/27/21 0135 05/27/21 0800 05/27/21 1200  ?BP: 140/78  (!) 145/75 137/80  ?Pulse: 90     ?Resp: 18  18   ?Temp: 98.3 ?F (36.8 ?C)  98.4 ?F (36.9 ?C) 98.6 ?F (37 ?C)  ?TempSrc: Oral  Oral Oral  ?SpO2:  94% 95% 95%  ?Weight:      ?Height:      ? ? ?Intake/Output Summary (Last 24 hours) at 05/27/2021 1515 ?Last data filed at 05/26/2021 1558 ?Gross per 24 hour  ?Intake 120.63 ml  ?Output 500 ml  ?Net -379.37 ml  ? ?Filed Weights  ? 05/23/21 1854  ?Weight: 60.8 kg  ? ? ?Examination:  ?Constitutional:  ?VS as above ?General Appearance: alert, well-developed, NAD ?Ears, Nose, Mouth, Throat: ?Normal appearance, dry MM ?Neck: ?No masses, trachea midline ?Respiratory: ?Normal respiratory effort ?Cardiovascular: ?S1/S2 normal, no murmur/rub/gallop auscultated ?No lower extremity edema ?Gastrointestinal: ?Nontender, no masses ?Musculoskeletal:  ?No clubbing/cyanosis of digits ?Neurological: ?No cranial nerve deficit on limited exam ? ? ? ? ? ? ? ?Scheduled Medications:  ? amitriptyline  75 mg Oral QHS  ? ascorbic acid  500 mg Oral Daily  ? aspirin EC  81 mg Oral Daily  ? benzonatate  100 mg Oral TID  ? cholecalciferol  2,000 Units Oral Daily  ? enoxaparin (LOVENOX) injection  30 mg Subcutaneous Q24H  ? ezetimibe  10 mg Oral Daily  ? ferrous sulfate  325 mg Oral Q breakfast  ? gabapentin  200 mg Oral TID  ? guaiFENesin  600 mg Oral BID  ? insulin aspart  0-9 Units Subcutaneous TID WC  ? insulin aspart  5 Units Subcutaneous TID WC  ? insulin glargine-yfgn  17 Units Subcutaneous Daily  ? isosorbide mononitrate  30 mg Oral Daily  ? levothyroxine  150 mcg Oral Q0600  ?  multivitamin-lutein  1 capsule Oral Daily  ? pantoprazole  40 mg Oral Daily  ? ? ?Continuous Infusions: ? piperacillin-tazobactam (ZOSYN)  IV 3.375 g (05/27/21 1500)  ? ? ?PRN Medications:  ?acetaminophen, dextromethorphan-guaiFENesin, hydrALAZINE, HYDROcodone-acetaminophen, ipratropium-albuterol, magnesium hydroxide, metoprolol tartrate, nitroGLYCERIN, ondansetron **OR** ondansetron (ZOFRAN) IV, prochlorperazine, senna-docusate ? ?Antimicrobials:  ?Anti-infectives (From admission, onward)  ? ? Start     Dose/Rate Route Frequency Ordered Stop  ? 05/26/21 1430  piperacillin-tazobactam (ZOSYN) IVPB 3.375 g       ? 3.375 g ?12.5 mL/hr over 240 Minutes Intravenous Every 8 hours 05/26/21 1333    ? 05/24/21 2200  cefTRIAXone (ROCEPHIN) 2 g in sodium chloride 0.9 % 100 mL IVPB  Status:  Discontinued       ? 2 g ?200 mL/hr over 30 Minutes Intravenous Every 24 hours 05/23/21 2154 05/24/21 0840  ? 05/24/21 2200  azithromycin (ZITHROMAX) 500 mg in sodium chloride 0.9 % 250 mL IVPB  Status:  Discontinued       ? 500 mg ?250 mL/hr over 60 Minutes Intravenous Every 24 hours 05/23/21 2154 05/24/21 0840  ? 05/24/21 1200  Ampicillin-Sulbactam (UNASYN) 3 g in sodium chloride 0.9 % 100 mL IVPB  Status:  Discontinued       ? 3 g ?200 mL/hr over 30 Minutes Intravenous Every 12 hours 05/24/21 0910 05/26/21  1333  ? 05/23/21 2100  metroNIDAZOLE (FLAGYL) IVPB 500 mg       ? 500 mg ?100 mL/hr over 60 Minutes Intravenous  Once 05/23/21 2055 05/23/21 2346  ? 05/23/21 2045  cefTRIAXone (ROCEPHIN) 2 g in sodium chloride 0.9 % 100 mL IVPB       ? 2 g ?200 mL/hr over 30 Minutes Intravenous  Once 05/23/21 2039 05/23/21 2148  ? 05/23/21 2045  azithromycin (ZITHROMAX) 500 mg in sodium chloride 0.9 % 250 mL IVPB       ? 500 mg ?250 mL/hr over 60 Minutes Intravenous  Once 05/23/21 2039 05/23/21 2308  ? ?  ? ? ?Data Reviewed: I have personally reviewed following labs and imaging studies ? ?CBC: ?Recent Labs  ?Lab 05/23/21 ?1945 05/24/21 ?[id number]  05/25/21 ?0407 05/26/21 ?[id number] 05/27/21 ?[id number]  ?WBC 4.0 8.3 10.9* 10.7* 9.0  ?NEUTROABS 3.1  --   --   --   --   ?HGB 10.9* 9.6* 10.2* 9.6* 10.1*  ?HCT 34.7* 30.9* 32.8* 29.9* 31.8*  ?MCV 89.9 91.4 91.4 88.5 89.1  ?PLT 161 150 146*

## 2021-05-27 NOTE — Progress Notes (Signed)
Patient crying out for help and some water. Observed patient wheezing, so sat her up again (she keeps laying on her left side) and reminded her to close her mouth/deep breath through her nose. Called RT to adminster a PRN neb treatment. O2 at 94% on 5L-[place]. ?

## 2021-05-27 NOTE — Progress Notes (Signed)
Patient's POA - daughter [name] - requested for patient to have her Elavil at 2100. Informed POA the attending Physician has not ordered it for her. Paged Hospitalist who denied the request and for the medication to be looked at by patient's attending tomorrow. Left VM with granddaughter Chipper Oman (per POA's request to call her first with any changes) and spoke with POA and informed her of the decision. ?

## 2021-05-27 NOTE — Progress Notes (Signed)
Mobility Specialist - Progress Note ? ? 05/27/21 1400  ?Mobility  ?Activity Refused mobility  ? ? ? ?2nd attempt this date. Pt requested to return later this date, but pt now sleeping in recliner. Attempted to awake pt with voice/touch for return to bed, pt slightly lifts head but does not open eyes or further respond. Will attempt another date/time.  ? ? ?Amy Calhoun ?Mobility Specialist ?05/27/21, 2:13 PM ? ? ? ? ?

## 2021-05-27 NOTE — Progress Notes (Signed)
Upon entering room, observed patient eating from a bag of chips left within reach by a family member. Removed the bag, sat patient up and re-educated regarding her NPO status. Patient is confused and doesn't understand why she can't eat/drink. ?

## 2021-05-27 NOTE — Progress Notes (Signed)
Patient stated her breathing is improving after her RT treatment. ?

## 2021-05-28 DIAGNOSIS — J690 Pneumonitis due to inhalation of food and vomit: Principal | ICD-10-CM

## 2021-05-28 DIAGNOSIS — R935 Abnormal findings on diagnostic imaging of other abdominal regions, including retroperitoneum: Secondary | ICD-10-CM

## 2021-05-28 LAB — BASIC METABOLIC PANEL
Anion gap: 7 (ref 5–15)
BUN: 51 mg/dL — ABNORMAL HIGH (ref 8–23)
CO2: 26 mmol/L (ref 22–32)
Calcium: 8 mg/dL — ABNORMAL LOW (ref 8.9–10.3)
Chloride: 112 mmol/L — ABNORMAL HIGH (ref 98–111)
Creatinine, Ser: 1.48 mg/dL — ABNORMAL HIGH (ref 0.44–1.00)
GFR, Estimated: 33 mL/min — ABNORMAL LOW (ref >60)
Glucose, Bld: 142 mg/dL — ABNORMAL HIGH (ref 70–99)
Potassium: 3.6 mmol/L (ref 3.5–5.1)
Sodium: 145 mmol/L (ref 135–145)

## 2021-05-28 LAB — CBC
HCT: 32.4 % — ABNORMAL LOW (ref 36.0–46.0)
Hemoglobin: 10.1 g/dL — ABNORMAL LOW (ref 12.0–15.0)
MCH: 28.5 pg (ref 26.0–34.0)
MCHC: 31.2 g/dL (ref 30.0–36.0)
MCV: 91.3 fL (ref 80.0–100.0)
Platelets: 204 10*3/uL (ref 150–400)
RBC: 3.55 MIL/uL — ABNORMAL LOW (ref 3.87–5.11)
RDW: 13.7 % (ref 11.5–15.5)
WBC: 9 10*3/uL (ref 4.0–10.5)
nRBC: 0 % (ref 0.0–0.2)

## 2021-05-28 LAB — GLUCOSE, CAPILLARY
Glucose-Capillary: 140 mg/dL — ABNORMAL HIGH (ref 70–99)
Glucose-Capillary: 148 mg/dL — ABNORMAL HIGH (ref 70–99)
Glucose-Capillary: 92 mg/dL (ref 70–99)
Glucose-Capillary: 74 mg/dL (ref 70–99)

## 2021-05-28 LAB — MAGNESIUM: Magnesium: 2.8 mg/dL — ABNORMAL HIGH (ref 1.7–2.4)

## 2021-05-28 NOTE — Progress Notes (Signed)
?PROGRESS NOTE ? ? ? ?Amy Calhoun  WJX:[id number] DOB: 04/21/1928  ?DOA: 05/23/2021 ?Date of Service: 05/28/21 ?PCP: Marletta LorBarr, Julie, NP ? ? ?Brief Narrative / Hospital Course:  ?To ED 05/23/21 initially in AM for dysphagia w/ N/V, underwent EGD which demonstrated fluid in lower third of esophagus, excessive gastric fluid, no specimens collected, patient tolerated the procedure poorly due to respiratory instability with hypoxia however recovered in PACU and was discharged home.  Since being home, experience worsening shortness of breath, dyspnea on exertion, cough and presented to the ED again in the evening 05/23/2021.Chest x-ray showed extensive airspace disease throughout left lung, concerning for aspiration pneumonia.  Treated with Rocephin, Zithromax, nebulized albuterol.  Admitted for treatment of aspiration pneumonia and AKI on stage IIIa CKD, acute on chronic respiratory failure with hypoxia. 05/10: Antibiotics were changed to Unasyn, seen by speech therapy, started back on diet, however worsening respiratory status 05/11 and made n.p.o. again due to concerns of recurrent aspiration. 05/12 CT chest concerning for multifocal pneumonia, bilateral lung opacities left greater than right, concern for mediastinal adenopathy, esophagus possibly dilated/filled with food debris, obstruction could not be excluded.  Spoke with GI today, she is high risk for another EGD/anesthesia, recommended esophagram in another 24 to 48 hours, agree that she should be n.p.o. however family insists that she is tolerating medications crushed in pudding, discussed directly with daughter regarding risk of aspiration even if she is tolerating it now, daughter believes that this risk is acceptable. Needing to wait until tomorrow 05/29/21 for esophogram given non-urgent and no radiologist in-house over the weekend.  ? ?Consultants:  ?GI ? ?Procedures: ?EGD 05/23/2021 (prior to admission)  ? ? ? ?Subjective: ?Patient reports no complaints  today. History obtained from RN and patient, no family at bedside.  RN reports patient has been pleasant, alert, no choking episodes, confusion at baseline.  ? ? ? ? ?ASSESSMENT & PLAN: ?  ?Principal Problem: ?  Aspiration pneumonia (HCC) ?Active Problems: ?  Acute kidney injury superimposed on chronic kidney disease (HCC) ?  Acute on chronic respiratory failure with hypoxia (HCC) ?  Uncontrolled type 2 diabetes mellitus with hyperglycemia, with long-term current use of insulin (HCC) ?  Acquired hypothyroidism ?  Essential hypertension ?  GERD (gastroesophageal reflux disease) ?  Coronary artery disease without angina pectoris ? ? ?Aspiration pneumonia (HCC) ?IV Rocephin and Zithromax. ?mucolytic's as well as bronchodilator therapy. ? follow blood cultures. ?patient n.p.o. except medications and obtain speech therapy consult -  family daughter who is HCPOA) reports that she is tolerating medications crushed in pudding, discussed directly with daughter 05/27/21 regarding risk of aspiration even if she is tolerating it now, daughter believes that this risk is acceptable even if it causes or hastens death  ?Needing to wait until tomorrow 05/29/21 for esophogram given non-urgent and no radiologist in-house over the weekend. Pending this test and will d/w GI. Pt's aspiration risk unlikely to be amenable to procedural intervention such as dilatation, NG/PEG tube. Will d/w family once esophogram gives us more information, but long-term prognosis is poor, it is likely that there weill be recurrent aspiration issues with inherent complications  ? ? ?Acquired hypothyroidism ?continue Synthroid. ? ?Acute kidney injury superimposed on chronic kidney disease (HCC) ?AKI superimposed on stage IIIa chronic kidney disease. ?IV normal saline. ? follow BMP. ? hold off nephrotoxins as able ? ?Acute on chronic respiratory failure with hypoxia (HCC) ?Supplemental O2  ? ?Uncontrolled type 2 diabetes mellitus with hyperglycemia, with  long-term current use  of insulin ([place]) ?Basal coverage + SSI ? ?Essential hypertension ?holding off Cozaar given acute kidney injury ?prn IV labetalol. ? ?Coronary artery disease without angina pectoris ?as needed sublingual nitroglycerin and Zetia. ? ?GERD (gastroesophageal reflux disease) ?PPI continued ? ? ? ?DVT prophylaxis: lovenox ?Code Status: DNR ?Family Communication: none at this time will update tomorrow after esophogram / pending any other developments  ?Disposition Plan / TOC needs: Pt has recommended HH PT ?Barriers to discharge / significant pending items: esophogram tomorrow per GI recommendations.  Pending those results, may need to discuss with family how we are going to provide nutrition, would patient have wanted invasive feeding tubes, realistic that these devices are also not reliable at preventing aspiration ? ? ? ? ? ? ? ? ? ? ? ? ?Objective: ?Vitals:  ? 05/27/21 1529 05/27/21 1948 05/28/21 0507 05/28/21 [id number]  ?BP: (!) 117/57 (!) 118/54 (!) 146/67 (!) 149/67  ?Pulse: 79 85 87 93  ?Resp: 18 20 20 18   ?Temp: (!) 97.4 ?F (36.3 ?C) 98.5 ?F (36.9 ?C) 98.3 ?F (36.8 ?C) 99.4 ?F (37.4 ?C)  ?TempSrc: Oral Oral Oral   ?SpO2: 100% 99% 100% 95%  ?Weight:      ?Height:      ? ? ?Intake/Output Summary (Last 24 hours) at 05/28/2021 1335 ?Last data filed at 05/28/2021 0548 ?Gross per 24 hour  ?Intake 279.37 ml  ?Output 0 ml  ?Net 279.37 ml  ? ?Filed Weights  ? 05/23/21 1854  ?Weight: 60.8 kg  ? ? ?Examination:  ?Constitutional:  ?VS as above ?General Appearance: alert, well-developed, NAD ?Ears, Nose, Mouth, Throat: ?Normal appearance, dry MM ?Neck: ?No masses, trachea midline ?Respiratory: ?Normal respiratory effort ?Cardiovascular: ?S1/S2 normal, no murmur/rub/gallop auscultated ?No lower extremity edema ?Gastrointestinal: ?Nontender, no masses ?Musculoskeletal:  ?No clubbing/cyanosis of digits ?Neurological: ?No cranial nerve deficit on limited exam ? ? ? ? ? ? ? ?Scheduled Medications:  ? amitriptyline  75  mg Oral QHS  ? ascorbic acid  500 mg Oral Daily  ? aspirin EC  81 mg Oral Daily  ? benzonatate  100 mg Oral TID  ? cholecalciferol  2,000 Units Oral Daily  ? enoxaparin (LOVENOX) injection  30 mg Subcutaneous Q24H  ? ezetimibe  10 mg Oral Daily  ? ferrous sulfate  325 mg Oral Q breakfast  ? gabapentin  200 mg Oral TID  ? guaiFENesin  600 mg Oral BID  ? insulin aspart  0-9 Units Subcutaneous TID WC  ? insulin aspart  5 Units Subcutaneous TID WC  ? insulin glargine-yfgn  17 Units Subcutaneous Daily  ? isosorbide mononitrate  30 mg Oral Daily  ? levothyroxine  150 mcg Oral Q0600  ? multivitamin-lutein  1 capsule Oral Daily  ? pantoprazole  40 mg Oral Daily  ? ? ?Continuous Infusions: ? piperacillin-tazobactam (ZOSYN)  IV 3.375 g (05/28/21 0548)  ? ? ?PRN Medications:  ?acetaminophen, dextromethorphan-guaiFENesin, hydrALAZINE, HYDROcodone-acetaminophen, ipratropium-albuterol, magnesium hydroxide, metoprolol tartrate, nitroGLYCERIN, ondansetron **OR** ondansetron (ZOFRAN) IV, prochlorperazine, senna-docusate ? ?Antimicrobials:  ?Anti-infectives (From admission, onward)  ? ? Start     Dose/Rate Route Frequency Ordered Stop  ? 05/26/21 1430  piperacillin-tazobactam (ZOSYN) IVPB 3.375 g       ? 3.375 g ?12.5 mL/hr over 240 Minutes Intravenous Every 8 hours 05/26/21 1333    ? 05/24/21 2200  cefTRIAXone (ROCEPHIN) 2 g in sodium chloride 0.9 % 100 mL IVPB  Status:  Discontinued       ? 2 g ?200 mL/hr over 30 Minutes Intravenous  Every 24 hours 05/23/21 2154 05/24/21 0840  ? 05/24/21 2200  azithromycin (ZITHROMAX) 500 mg in sodium chloride 0.9 % 250 mL IVPB  Status:  Discontinued       ? 500 mg ?250 mL/hr over 60 Minutes Intravenous Every 24 hours 05/23/21 2154 05/24/21 0840  ? 05/24/21 1200  Ampicillin-Sulbactam (UNASYN) 3 g in sodium chloride 0.9 % 100 mL IVPB  Status:  Discontinued       ? 3 g ?200 mL/hr over 30 Minutes Intravenous Every 12 hours 05/24/21 0910 05/26/21 1333  ? 05/23/21 2100  metroNIDAZOLE (FLAGYL) IVPB 500 mg        ? 500 mg ?100 mL/hr over 60 Minutes Intravenous  Once 05/23/21 2055 05/23/21 2346  ? 05/23/21 2045  cefTRIAXone (ROCEPHIN) 2 g in sodium chloride 0.9 % 100 mL IVPB       ? 2 g ?200 mL/hr over 30 Minutes

## 2021-05-28 NOTE — Progress Notes (Signed)
Mobility Specialist - Progress Note ? ? ? 05/28/21 1100  ?Mobility  ?Activity Stood at bedside;Transferred from bed to chair;Dangled on edge of bed;Ambulated with assistance in room  ?Level of Assistance Minimal assist, patient does 75% or more  ?Assistive Device None  ?Distance Ambulated (ft) 5 ft  ?Activity Response Tolerated well  ?$Mobility charge 1 Mobility  ? ? ?During mobility: 87% -90% SpO2 ? ? ?Pt supine upon arrival using 3.5L. Completes bed mobility MinA and STS with MInA. Ambulates 69ft to complete B-C CGA no AD and descends into chair MinA. Voices no complaints and is left with chair alarm set and granddaughter at bedside. ? ?Merrily Brittle ?Mobility Specialist ?05/28/21, 11:39 AM ? ? ?

## 2021-05-28 NOTE — Consult Note (Signed)
?  ?[name] , MD ?[address], [place], [place], [address] ?[address], [place], [place], [address] ?Phone: [phone]  ?Fax: [phone] ? Consultation ? ?Referring Provider:   Dr. Lyn Hollingshead ?Primary Care Physician:  Marletta Lor, NP ?Primary Gastroenterologist: None         ?Reason for Consultation:     Dysphagia ? ?Date of Admission:  05/23/2021 ?Date of Consultation:  05/28/2021 ?       ? HPI:   ?Amy Calhoun is a 88 y.o. female whom I had seen5/09/2021 when she presented to the emergency room with dysphagia.  I performed an upper endoscopy and there was no obstruction seen but a lot of food noted in the stomach which was regurgitating into the esophagus and all the way up to the upper esophageal sphincter.  She subsequently was sent home and readmitted with diagnosis of aspiration pneumonia.  She tolerated the procedure very poorly due to hypoxia and the procedure had to be done very quickly and terminated before I could take multiple pictures or aspirate any more fluid.  It was felt at that time that there was no benefit in trying to repeat the procedure immediately in the acute setting as there was no obstruction.   ? ?A CT scan was done for hypoxia which showed fluid-filled debris within the esophagus and obstruction could not be excluded.  The admitting team discussed with me and I explained that with the new diagnosis of bilateral pneumonia, considering her age and how she reacted to anesthesia on initial presentation it would be very high risk to perform any endoscopy evaluation and the utility of the same would be minimal as we had seen a day prior,  there was no obstruction and it was a stomach filled with a lot of food and I suggest to keep her n.p.o.  Swallowing assessment was performed and there was concern for aspiration due to contents regurgitating from the stomach.  I was contacted yet again last evening to decide if she would benefit from an endoscopy. ? ?Presently  she states she is doing.  Denies any swallowing.  Denies any nausea vomiting.  Mobility assessment today showed that during mobility her oxygen levels dropped to 87 to 90%. ? ? ?05/28/2021 hemoglobin 10.1 g stable white cell count 9 g.  Creatinine was initially elevated to 1.8 and today back to baseline at 1.48. ? ?Past Medical History:  ?Diagnosis Date  ? Allergy   ? Ambulates with cane   ? Anemia   ? At high risk for falls   ? Cataract   ? removed both eyes  ? Chronic kidney disease 08/07/2016  ? Chronic renal failure, stage III  ? Coronary artery disease   ? Severe, diffuse RCA disease by cath (09/2020).  NSTEMI (12/2020) - medical therapy recommended  ? Depression   ? Esophageal dysphagia   ? Gastric ulcer   ? GERD (gastroesophageal reflux disease)   ? Hyperlipidemia   ? Hypertension   ? Hypothyroidism   ? IDDM (insulin dependent diabetes mellitus)   ? Ischemic colitis (HCC)   ? Neuromuscular disorder (HCC)   ? neuropathy  ? Osteoarthritis   ? Ulcer of esophagus   ? ? ?Past Surgical History:  ?Procedure Laterality Date  ? ABDOMINAL HYSTERECTOMY    ? CATARACT EXTRACTION, BILATERAL    ? CHOLECYSTECTOMY    ? COLONOSCOPY  2010  ? ESOPHAGEAL DILATION  08/07/2016  ? usually a couple times a year, last  time 07/30/16  ? ESOPHAGOGASTRODUODENOSCOPY (EGD) WITH PROPOFOL N/A 05/23/2021  ? Procedure: ESOPHAGOGASTRODUODENOSCOPY (EGD) WITH PROPOFOL;  Surgeon: Jonathon Bellows, MD;  Location: [facility] ENDOSCOPY;  Service: Gastroenterology;  Laterality: N/A;  ? GALLBLADDER SURGERY    ? LEFT HEART CATH AND CORONARY ANGIOGRAPHY N/A 10/11/2020  ? Procedure: LEFT HEART CATH AND CORONARY ANGIOGRAPHY;  Surgeon: Nelva Bush, MD;  Location: [facility];  Service: Cardiovascular;  Laterality: N/A;  ? LUMBAR LAMINECTOMY/DECOMPRESSION MICRODISCECTOMY N/A 08/10/2016  ? Procedure: BILATERAL HEMILAMINECTOMY LUMBAR THREE-FOUR,LUMBAR FOUR-FIVE,LEFT LUMBAR FIVE-SACRAL ONE HEMILAMINECTOMY AND DECOMPRESSION;  Surgeon: Ashok Pall, MD;  Location: [facility];  Service: Neurosurgery;  Laterality: N/A;  ? PACEMAKER IMPLANT N/A 10/12/2020  ? Procedure: PACEMAKER IMPLANT;  Surgeon: Vickie Epley, MD;  Location: [facility];  Service: Cardiovascular;  Laterality: N/A;  ? TEMPORARY PACEMAKER N/A 10/11/2020  ? Procedure: TEMPORARY PACEMAKER;  Surgeon: Nelva Bush, MD;  Location: [facility];  Service: Cardiovascular;  Laterality: N/A;  ? THYROIDECTOMY    ? UPPER GASTROINTESTINAL ENDOSCOPY    ? ? ?Prior to Admission medications   ?Medication Sig Start Date End Date Taking? Authorizing Provider  ?albuterol (PROVENTIL) (2.5 MG/3ML) 0.083% nebulizer solution Take 3 mLs (2.5 mg total) by nebulization every 6 (six) hours as needed for wheezing or shortness of breath. 12/21/20  Yes Burnard Hawthorne, FNP  ?albuterol (VENTOLIN HFA) 108 (90 Base) MCG/ACT inhaler Inhale 2 puffs into the lungs every 6 (six) hours as needed for wheezing or shortness of breath. 12/21/20  Yes Burnard Hawthorne, FNP  ?amitriptyline (ELAVIL) 100 MG tablet Take 1 tablet (100 mg total) by mouth at bedtime. ?Patient taking differently: Take 75 mg by mouth at bedtime. 12/07/20  Yes Burnard Hawthorne, FNP  ?ascorbic acid (VITAMIN C) 500 MG tablet Take 500 mg by mouth daily.   Yes [provider]  ?aspirin EC 81 MG EC tablet Take 1 tablet (81 mg total) by mouth daily. Swallow whole. 12/31/20  Yes Fritzi Mandes, MD  ?budesonide-formoterol [facility]) 80-4.5 MCG/ACT inhaler Inhale 2 puffs into the lungs 2 (two) times daily. 12/23/20  Yes [provider]  ?Cholecalciferol (VITAMIN D) 2000 units tablet Take 2,000 Units by mouth daily.    Yes [provider]  ?Cyanocobalamin (DODEX IJ) Inject as directed every 30 (thirty) days.   Yes [provider]  ?ezetimibe (ZETIA) 10 MG tablet Take 1 tablet (10 mg total) by mouth daily. 05/12/21  Yes End, Harrell Gave, MD  ?ferrous sulfate 324 MG TBEC Take 324 mg by mouth daily with breakfast.   Yes [provider]  ?furosemide (LASIX) 20 MG tablet Take 0.5 tablets (10 mg total) by mouth daily. 05/12/21  Yes End, Harrell Gave, MD  ?gabapentin (NEURONTIN) 100 MG capsule Take 2 capsules (200 mg total) by mouth 3 (three) times daily. 10/17/20  Yes Jennye Boroughs, MD  ?guaiFENesin (ROBITUSSIN) 100 MG/5ML liquid Take 10 mLs by mouth every 4 (four) hours as needed for cough or to loosen phlegm. 12/30/20  Yes Fritzi Mandes, MD  ?HYDROcodone-acetaminophen (NORCO) 10-325 MG tablet Take 1 tablet by mouth every 6 (six) hours as needed. 01/11/21  Yes [provider]  ?insulin glargine (LANTUS SOLOSTAR) 100 UNIT/ML Solostar Pen Inject 14 Units into the skin at bedtime. 04/12/21  Yes Shamleffer, Melanie Crazier, MD  ?insulin lispro (HUMALOG KWIKPEN) 100 UNIT/ML KwikPen Inject 10 Units into the skin 3 (three) times daily. Max Daily 30 units 04/14/21  Yes Shamleffer, Melanie Crazier, MD  ?isosorbide mononitrate (IMDUR) 30  MG 24 hr tablet Take 1 tablet (30 mg total) by mouth daily. 05/12/21 08/10/21 Yes End, Harrell Gave, MD  ?levothyroxine (SYNTHROID) 150 MCG tablet Take 1 tablet (150 mcg total) by mouth daily before breakfast. 2 tabs on Sundays and 1 tablet the rest of the week 04/14/21  Yes Shamleffer, Melanie Crazier, MD  ?losartan (COZAAR) 50 MG tablet Take 1 tablet (50 mg total) by mouth daily. 05/12/21 08/10/21 Yes End, Harrell Gave, MD  ?Multiple Vitamins-Minerals (PRESERVISION AREDS PO) Take 1 tablet by mouth daily.   Yes [provider]  ?omeprazole (PRILOSEC) 40 MG capsule TAKE 1 CAPSULE TWICE A DAY 07/25/20  Yes Milus Banister, MD  ?prochlorperazine (COMPAZINE) 10 MG tablet Take 10 mg by mouth every 4 (four) hours as needed. 01/24/21  Yes [provider]  ?THICK-IT POWD See admin instructions. 01/24/21  Yes [provider]  ?Grant Ruts INHUB 100-50 MCG/ACT AEPB Inhale 1 puff into the lungs 2 (two) times daily. 01/18/21  Yes [provider]  ?Acetaminophen-Codeine 300-30 MG tablet Take 1 tablet by mouth  every 12 (twelve) hours as needed for pain (moderate to severe pain). ?Patient not taking: Reported on 05/23/2021 12/30/20   Fritzi Mandes, MD  ?benzonatate (TESSALON) 100 MG capsule Take 1 capsule (100 m

## 2021-05-28 NOTE — Plan of Care (Signed)
Patient AAOx3, moderate back pain this morning. Daughter at bedside, says she looks and sounds better today. Plan for increased mobility and pain control. Bed is in lowest position, call light within reach. Will continue to monitor. ?

## 2021-05-28 NOTE — Progress Notes (Signed)
Garment/textile technologist Liaison Note ? ?Follow up referral for hospice at home upon discharge from the ED.  Communicated with Dr. Lyn Hollingshead who states patient has no plans to discharge today.  Per GI recommendations, patient needs esophogram which won't be scheduled until tomorrow.  Discharge plans pending test results and GI input. ? ?Will continue to follow through final disposition. ?Thank you for allowing participation in this patient's care. ? ?Renea Ee, RN ?[facility] ?[phone] ?

## 2021-05-29 ENCOUNTER — Inpatient Hospital Stay: Payer: Medicare Other

## 2021-05-29 DIAGNOSIS — I10 Essential (primary) hypertension: Secondary | ICD-10-CM

## 2021-05-29 DIAGNOSIS — E1165 Type 2 diabetes mellitus with hyperglycemia: Secondary | ICD-10-CM

## 2021-05-29 DIAGNOSIS — J9621 Acute and chronic respiratory failure with hypoxia: Secondary | ICD-10-CM

## 2021-05-29 DIAGNOSIS — K219 Gastro-esophageal reflux disease without esophagitis: Secondary | ICD-10-CM

## 2021-05-29 DIAGNOSIS — Z794 Long term (current) use of insulin: Secondary | ICD-10-CM

## 2021-05-29 DIAGNOSIS — E039 Hypothyroidism, unspecified: Secondary | ICD-10-CM

## 2021-05-29 DIAGNOSIS — N179 Acute kidney failure, unspecified: Secondary | ICD-10-CM

## 2021-05-29 DIAGNOSIS — E870 Hyperosmolality and hypernatremia: Secondary | ICD-10-CM

## 2021-05-29 DIAGNOSIS — I25810 Atherosclerosis of coronary artery bypass graft(s) without angina pectoris: Secondary | ICD-10-CM

## 2021-05-29 DIAGNOSIS — N189 Chronic kidney disease, unspecified: Secondary | ICD-10-CM

## 2021-05-29 LAB — CBC
HCT: 32.3 % — ABNORMAL LOW (ref 36.0–46.0)
Hemoglobin: 10 g/dL — ABNORMAL LOW (ref 12.0–15.0)
MCH: 27.9 pg (ref 26.0–34.0)
MCHC: 31 g/dL (ref 30.0–36.0)
MCV: 90.2 fL (ref 80.0–100.0)
Platelets: 216 10*3/uL (ref 150–400)
RBC: 3.58 MIL/uL — ABNORMAL LOW (ref 3.87–5.11)
RDW: 13.6 % (ref 11.5–15.5)
WBC: 9.1 10*3/uL (ref 4.0–10.5)
nRBC: 0 % (ref 0.0–0.2)

## 2021-05-29 LAB — BASIC METABOLIC PANEL
Anion gap: 10 (ref 5–15)
BUN: 35 mg/dL — ABNORMAL HIGH (ref 8–23)
CO2: 25 mmol/L (ref 22–32)
Calcium: 8.1 mg/dL — ABNORMAL LOW (ref 8.9–10.3)
Chloride: 115 mmol/L — ABNORMAL HIGH (ref 98–111)
Creatinine, Ser: 1.22 mg/dL — ABNORMAL HIGH (ref 0.44–1.00)
GFR, Estimated: 42 mL/min — ABNORMAL LOW (ref >60)
Glucose, Bld: 68 mg/dL — ABNORMAL LOW (ref 70–99)
Potassium: 3.4 mmol/L — ABNORMAL LOW (ref 3.5–5.1)
Sodium: 150 mmol/L — ABNORMAL HIGH (ref 135–145)

## 2021-05-29 LAB — GLUCOSE, CAPILLARY
Glucose-Capillary: 70 mg/dL (ref 70–99)
Glucose-Capillary: 95 mg/dL (ref 70–99)
Glucose-Capillary: 215 mg/dL — ABNORMAL HIGH (ref 70–99)
Glucose-Capillary: 194 mg/dL — ABNORMAL HIGH (ref 70–99)

## 2021-05-29 LAB — MAGNESIUM: Magnesium: 2.5 mg/dL — ABNORMAL HIGH (ref 1.7–2.4)

## 2021-05-29 LAB — SODIUM: Sodium: 148 mmol/L — ABNORMAL HIGH (ref 135–145)

## 2021-05-29 MED ORDER — SODIUM CHLORIDE 0.45 % IV SOLN: Freq: Once | INTRAVENOUS | Status: AC

## 2021-05-29 MED ORDER — AMOXICILLIN-POT CLAVULANATE 500-125 MG PO TABS: 1.0000 | ORAL_TABLET | Freq: Two times a day (BID) | ORAL | 0 refills | Status: AC

## 2021-05-29 MED ORDER — LANTUS SOLOSTAR 100 UNIT/ML ~~LOC~~ SOPN
16.0000 [IU] | PEN_INJECTOR | Freq: Every day | SUBCUTANEOUS | 2 refills | Status: DC
Start: 2021-05-29 — End: 2021-08-17

## 2021-05-29 MED ORDER — IOHEXOL 300 MG/ML  SOLN
150.0000 mL | Freq: Once | INTRAMUSCULAR | Status: AC | PRN
  Administered 2021-05-29: 75 mL via ORAL

## 2021-05-29 MED ORDER — BACLOFEN 5 MG PO TABS: 2.5000 mg | ORAL_TABLET | Freq: Two times a day (BID) | ORAL | 0 refills | Status: AC | PRN

## 2021-05-29 MED ORDER — LOSARTAN POTASSIUM 50 MG PO TABS: 25.0000 mg | ORAL_TABLET | Freq: Every day | ORAL | 0 refills | Status: DC

## 2021-05-29 NOTE — TOC Transition Note (Signed)
Transition of Care (TOC) - CM/SW Discharge Note ? ? ?Patient Details  ?Name: Amy Calhoun ?MRN: [id number] ?Date of Birth: [birth year] ? ?Transition of Care (TOC) CM/SW Contact:  ?Candie Chroman, LCSW ?Phone Number: ?05/29/2021, 3:56 PM ? ? ?Clinical Narrative: Patient has orders to discharge home today. EMS transport has been arranged and she is 4th on the list. Daughter is at the home waiting on her. Confirmed address on facesheet is correct. No further concerns. CSW signing off.   ? ?Final next level of care: [place] ?Barriers to Discharge: Barriers Resolved ? ? ?Patient Goals and CMS Choice ?  ?  ?  ? ?Discharge Placement ?  ?           ?  ?Patient to be transferred to facility by: EMS ?Name of family member notified: Yvetta Coder ?Patient and family notified of of transfer: 05/29/21 ? ?Discharge Plan and Services ?  ?  ?           ?  ?  ?  ?  ?  ?  ?  ?  ?  ?  ? ?Social Determinants of Health (SDOH) Interventions ?  ? ? ?Readmission Risk Interventions ?   ? View : No data to display.  ?  ?  ?  ? ? ? ? ? ?

## 2021-05-29 NOTE — Progress Notes (Signed)
Physical Therapy Treatment ?Patient Details ?Name: Amy Calhoun ?MRN: [id number] ?DOB: 12/22/1928 ?Today's Date: 05/29/2021 ? ? ?History of Present Illness 88 year old with history of CKD stage IIIa, DM2, chronic hypoxia on 1 L nasal cannula at home, CAD, depression, GERD, HTN, HLD, hypothyroidism admitted for shortness of breath and cough.  Recent endoscopy for esophageal stricture dilation, postprocedure patient started having nausea vomiting with significant hypoxia, chest x-ray showed concerns of aspiration pneumonia. ? ?  ?PT Comments  ? ? Pt seen for PT tx with pt agreeable. Pt is able to complete supine>sit with mod I with HOB elevated & bed rails & STS with supervision with RW. Pt is able to ambulate increased distances with RW & supervision with decreased gait speed overall but improving endurance. Pt engages in BLE strengthening exercises with encouragement. PT educates pt on pursed lip breathing to help alleviate feelings of SOB. Will continue to follow pt acutely to address balance, endurance, strengthening, and gait with LRAD. ? ?   ?Recommendations for follow up therapy are one component of a multi-disciplinary discharge planning process, led by the attending physician.  Recommendations may be updated based on patient status, additional functional criteria and insurance authorization. ? ?Follow Up Recommendations ? Home health PT ?  ?  ?Assistance Recommended at Discharge Frequent or constant Supervision/Assistance  ?Patient can return home with the following A little help with bathing/dressing/bathroom;Direct supervision/assist for medications management;Assistance with cooking/housework;Direct supervision/assist for financial management;Assist for transportation;A little help with walking and/or transfers ?  ?Equipment Recommendations ? None recommended by PT  ?  ?Recommendations for Other Services   ? ? ?  ?Precautions / Restrictions Precautions ?Precautions: Fall ?Restrictions ?Weight Bearing  Restrictions: No  ?  ? ?Mobility ? Bed Mobility ?Overal bed mobility: Modified Independent ?  ?  ?  ?  ?  ?  ?General bed mobility comments: supine>sit with HOB elevated & use of bed rails without physical assist ?  ? ?Transfers ?Overall transfer level: Needs assistance ?Equipment used: Rolling walker (2 wheels) ?  ?  ?  ?  ?  ?  ?  ?General transfer comment: STS with RW with supervision ?  ? ?Ambulation/Gait ?Ambulation/Gait assistance: Supervision ?Gait Distance (Feet): 135 Feet ?Assistive device: Rolling walker (2 wheels) ?Gait Pattern/deviations: Decreased step length - left, Decreased step length - right, Decreased dorsiflexion - right ?Gait velocity: decreased ?  ?  ?General Gait Details: slightly L trendelenburg gait ? ? ?Stairs ?  ?  ?  ?  ?  ? ? ?Wheelchair Mobility ?  ? ?Modified Rankin (Stroke Patients Only) ?  ? ? ?  ?Balance Overall balance assessment: Needs assistance ?Sitting-balance support: Feet supported ?Sitting balance-Leahy Scale: Good ?Sitting balance - Comments: supervision static sitting ?  ?Standing balance support: Bilateral upper extremity supported, During functional activity ?Standing balance-Leahy Scale: Fair ?Standing balance comment: supervision gait with RW ?  ?  ?  ?  ?  ?  ?  ?  ?  ?  ?  ?  ? ?  ?Cognition Arousal/Alertness: Awake/alert ?Behavior During Therapy: [facility] for tasks assessed/performed ?Overall Cognitive Status: History of cognitive impairments - at baseline ?  ?  ?  ?  ?  ?  ?  ?  ?  ?  ?  ?  ?  ?  ?  ?  ?General Comments: appears HOH ?  ?  ? ?  ?Exercises General Exercises - Lower Extremity ?Long Arc Quad: AROM, Strengthening, Both, 10 reps, Seated ?Hip ABduction/ADduction: AROM, Strengthening,  Seated, Both, 10 reps (hip adduction pillow squeezes) ?Hip Flexion/Marching: AROM, Strengthening, Both, 10 reps, Seated ?Other Exercises ?Other Exercises: 5x STS from recliner with mod<>max assist without BUE support with focus on BLE strengthening ? ?  ?General Comments General  comments (skin integrity, edema, etc.): pt on 4L/min via nasal cannula throughout session with SpO2 >/= 90% ?  ?  ? ?Pertinent Vitals/Pain Pain Assessment ?Pain Assessment: No/denies pain  ? ? ?Home Living   ?  ?  ?  ?  ?  ?  ?  ?  ?  ?   ?  ?Prior Function    ?  ?  ?   ? ?PT Goals (current goals can now be found in the care plan section) Acute Rehab PT Goals ?Patient Stated Goal: none stated ?PT Goal Formulation: With patient ?Time For Goal Achievement: 06/08/21 ?Potential to Achieve Goals: Good ?Progress towards PT goals: Progressing toward goals ? ?  ?Frequency ? ? ? Min 2X/week ? ? ? ?  ?PT Plan Current plan remains appropriate  ? ? ?Co-evaluation   ?  ?  ?  ?  ? ?  ?AM-PAC PT "6 Clicks" Mobility   ?Outcome Measure ? Help needed turning from your back to your side while in a flat bed without using bedrails?: None ?Help needed moving from lying on your back to sitting on the side of a flat bed without using bedrails?: None ?Help needed moving to and from a bed to a chair (including a wheelchair)?: A Little ?Help needed standing up from a chair using your arms (e.g., wheelchair or bedside chair)?: A Little ?Help needed to walk in hospital room?: A Little ?Help needed climbing 3-5 steps with a railing? : A Lot ?6 Click Score: 19 ? ?  ?End of Session Equipment Utilized During Treatment: Oxygen;Gait belt ?Activity Tolerance: Patient tolerated treatment well;Patient limited by fatigue ?Patient left: in chair;with chair alarm set;with call bell/phone within reach ?Nurse Communication: Mobility status ?PT Visit Diagnosis: Muscle weakness (generalized) (M62.81);Difficulty in walking, not elsewhere classified (R26.2);Unsteadiness on feet (R26.81) ?  ? ? ?Time: [id number] ?PT Time Calculation (min) (ACUTE ONLY): 23 min ? ?Charges:  $Therapeutic Activity: 23-37 mins          ?          ? ?Lavone Nian, PT, DPT ?05/29/21, 3:34 PM ? ? ? ?Amy Calhoun ?05/29/2021, 3:32 PM ? ?

## 2021-05-29 NOTE — Progress Notes (Signed)
Inpatient Diabetes Program Recommendations ? ?AACE/ADA: New Consensus Statement on Inpatient Glycemic Control (2015) ? ?Target Ranges:  Prepandial:   less than 140 mg/dL ?     Peak postprandial:   less than 180 mg/dL (1-2 hours) ?     Critically ill patients:  140 - 180 mg/dL  ? ?Lab Results  ?Component Value Date  ? GLUCAP 95 05/29/2021  ? HGBA1C 9.1 (A) 12/12/2020  ? ? ?Review of Glycemic Control ? Latest Reference Range & Units 05/28/21 08:06 05/28/21 11:31 05/28/21 16:24 05/28/21 23:29 05/29/21 07:48 05/29/21 11:37  ?Glucose-Capillary 70 - 99 mg/dL 169 (H) 678 (H) 92 74 70 95  ?(H): Data is abnormally high ? ?Diabetes history: DM2 ?Outpatient Diabetes medications: Lantus 14 units qhs, Humalog 10 units tid meal coverage ?Current orders for Inpatient glycemic control: Semglee 17 units,  Novolog 5 units tid meal coverage, Novolog 0-9 units tid correction ? ?Inpatient Diabetes Program Recommendations:   ?Patient is currently NPO. ?-D/C Novolog meal coverage ?-Decrease Semglee to 6 units qd (0.1 unit/kg x 60.8 kg) or D/C and restart if needed ?Secure chat sent to Dr. Lyn Hollingshead. ? ?Thank you, ?Billy Fischer Meilyn Heindl, RN, MSN, CDE  ?Diabetes Coordinator ?Inpatient Glycemic Control Team ?Team Pager [phone] (8am-5pm) ?05/29/2021 12:06 PM ? ? ? ? ?

## 2021-05-29 NOTE — Progress Notes (Signed)
[facility] Liaison note: ? ?Follow up visit to new referral for hospice services at home. ?Writer spoke with patient and her daughter  [name], who was at bedside.  ?Patient does have DME in place. ?Plan is for discharge home this afternoon. After some discussion with [name], it was decided that a safe discharge plan was for patient to transport home via non-emergent transport. ?[name] also requested to have some nectar thick liquids sent home. Writer spoke with SLP Jerilynn Som, who agreed to provide these. ? ?TOC Morrie Sheldon made aware of transport needs. ? ?Please have signed out of facility DNR in place at discharge.  ?Thank you. ? ?Dayna Barker BSN, RN, [facility] ?Hospital Liaison ?Civil engineer, contracting ?[phone] ? ?

## 2021-05-29 NOTE — Progress Notes (Signed)
Mobility Specialist - Progress Note ? ? 05/29/21 1400  ?Mobility  ?Activity Ambulated with assistance in room;Transferred from bed to chair  ?Level of Assistance Standby assist, set-up cues, supervision of patient - no hands on  ?Assistive Device Front wheel walker  ?Distance Ambulated (ft) 50 ft  ?Activity Response Tolerated well  ?$Mobility charge 1 Mobility  ? ? ? ?Pre-mobility: 89 HR, 97% SpO2 ?During mobility: 92 HR, 90% SpO2 ?Post-mobility: 90 HR, 94% SpO2 ? ? ?Pt lying in bed upon arrival, utilizing 4L. Pt ambulated in room with supervision. Does voice dizziness throughout activity, BP checked in sitting 144/65. SOB. Seated rest break taken. Pt ambulated back to bed with alarm set, needs in reach. Daughter at bedside.  ? ? ?Filiberto Pinks ?Mobility Specialist ?05/29/21, 2:19 PM ? ?

## 2021-05-29 NOTE — Progress Notes (Signed)
Speech Language Pathology Treatment: Dysphagia  ?Patient Details ?Name: Amy Calhoun ?MRN: [id number] ?DOB: June 16, 1928 ?Today's Date: 05/29/2021 ?Time: 3976-7341 ?SLP Time Calculation (min) (ACUTE ONLY): 40 min ? ?Assessment / Plan / Recommendation ?Clinical Impression ? Pt seen for education on general aspiration precautions including Small sips, Slowly w/ Nectar liquids Via Cup; ongoing assessment of swallowing in setting of pt's Baseline Esophageal phase dysmotility. Noted GI's notes indicating Esophageal phase Dysmotility. With such, pt is at increased risk for aspiration of REFLUXED material as noted in initial SLP eval.   ?During this session, pt stated she "knew" drinking Small sips was "better than" drinking Larger sips to reduce chance for choking/coughing. Pt practiced Small sips w/ light verbal cues to drink Slowly w/ this Clinician consuming ~4 ozs w/ No overt clinical s/s of aspiration noted. Clear vocal quality b/t sips. Pt fed self w/ setup support.  ?Recommend continue a dysphagia level 2 diet(minced foods, moistened) w/ Nectar consistency liquids as preferred by Dtr/pt; general aspiration precautions; GERD/REFLUX precautions. Pills Crushed in puree for safety and clearing; Tray setup at meals w/ feeding support and supervision at meals as needed, reduce Distractions during meals. Supplies of Nectar liquids provided; handouts on ordering information. NSG/MD updated. ? ? ?  ?HPI HPI: Pt is a 88 y.o. Caucasian female with medical history significant for stage IIIa chronic kidney disease, type 2 diabetes mellitus, chronic respiratory failure on home O2 at 1 and half liters per minute, coronary artery disease, depression, Esophageal phase dysphagia, GERD, hypertension, dyslipidemia and hypothyroidism, presented to emergency room with acute onset of worsening dyspnea with associated chest congestion throughout the day and cough productive of clear sputum with associated mild wheezing.  No fever or  chills.  The patient has significant Esophageal phase dysphagia and was seen in the ED with inability to swallow and persistent vomiting after trying to eat or drink anything.  She was taken to Endoscopy suite for EGD and was found to have an Esophageal Stricture that was dilated by Dr. Vicente Males.   The procedure went uneventfully and the patient recovered in the PACU and was discharged home.  Once she returned home however, she experienced worsening dyspnea and cough as well as reported having chills.  She also stated that she still had nausea and vomiting before her worsening dyspnea and cough.  She is usually on 1-1/2 L of O2 by nasal cannula and her O2 requirement increased to 4-5L at home to maintain an adequate pulse oximetry and was later on down to 2 to 3 L/min here.  No chest pain or palpitations.  No dysuria, oliguria or hematuria or flank pain.  No headache or dizziness or blurred vision.  No paresthesias or focal muscle weakness.   A previous HEAD CT 10/2020: "Global  parenchymal volume loss. Periventricular white matter hypodensities  consistent with sequela of chronic microvascular ischemic disease.". Unsure of pt's Baseline Cognitive status; Dtr monitors pt's care at home.    CXR: Extensive airspace disease throughout the left lung concerning for  pneumonia -- similar CXR presentation 12/2020. ?  ?   ?SLP Plan ? All goals met (supplies given) ? ?  ?  ?Recommendations for follow up therapy are one component of a multi-disciplinary discharge planning process, led by the attending physician.  Recommendations may be updated based on patient status, additional functional criteria and insurance authorization. ?  ? ?Recommendations  ?Diet recommendations:  (TBD -- recommended dysphagia level 2 w/ Nectar liquids) ?Liquids provided via: Cup;No straw ?Medication Administration: Whole  meds with puree (vs Crushed) ?Supervision: Patient able to self feed;Intermittent supervision to cue for compensatory strategies  (baseline Esophageal phase dysmotility) ?Compensations: Minimize environmental distractions;Slow rate;Small sips/bites ?Postural Changes and/or Swallow Maneuvers: Out of bed for meals;Seated upright 90 degrees;Upright 30-60 min after meal (GERD precautions; baseline Esophageal phase dysmotility)  ?   ?    ?   ? ? ? ? General recommendations:  (Dietician f/u as needed; ST f/u w/ HH as needed; Palliative Care services) ?Oral Care Recommendations: Oral care BID;Oral care before and after PO;Staff/trained caregiver to provide oral care (denture care) ?Follow Up Recommendations: Follow physician's recommendations for discharge plan and follow up therapies (baseline Esophageal phase dysmotility) ?Assistance recommended at discharge: Frequent or constant Supervision/Assistance (baseline Esophageal phase dysmotility) ?SLP Visit Diagnosis: Dysphagia, pharyngoesophageal phase (R13.14) (baseline Esophageal phase dysmotility) ?Plan: All goals met (supplies given) ? ? ? ? ?  ?  ? ? ? ? ? ?Orinda Kenner, MS, CCC-SLP ?Speech Language Pathologist ?Rehab Services; [place] ?[phone] (ascom) ?Sheketa Ende ? ?05/29/2021, 1:56 PM ?

## 2021-05-29 NOTE — Assessment & Plan Note (Addendum)
?   Appears IV fluids have been off, patient is n.p.o. except small portions with medications, discussed with daughter certainly option to keep another night for correction of this and close monitoring (trending down prior to discharge)  ?? In shared decision making after discussion of risks versus benefits, daughter opts for small dose IV fluids here and recheck sodium levels prior to discharge home, still planning for discharge home as long as they are trending down.  She says should be able to follow-up to get blood drawn in the next couple of days with PCP or with hospice, I said that would have to be up to them I am not able to order outpatient labs ?

## 2021-05-29 NOTE — Care Management Important Message (Signed)
Important Message ? ?Patient Details  ?Name: [name] ?MRN: [id number] ?Date of Birth: Dec 14, 1928 ? ? ?Medicare Important Message Given:  Other (see comment) ? ?Current disposition to discharge with hospice services.  Medicare IM withheld at this time out of respect for patient and family. ? ? ?[name] ?05/29/2021, 8:18 AM ?

## 2021-05-29 NOTE — Discharge Summary (Signed)
Physician Discharge Summary  ?Amy Calhoun [id number] DOB: Nov 17, 1928 DOA: 05/23/2021 ? ?PCP: Alvester Chou, NP ? ?Admit date: 05/23/2021 ?Discharge date: 05/29/2021 ? ?Admitted From: home (w/ home hospice) ?Disposition:  home (w/ home hospice) ? ?Recommendations for Outpatient Follow-up:  ?Follow up with PCP / hospice provider in 1-2 weeks ?Please obtain labs/tests: BMP tomorrow (Tuesday 05/30/21) or day after (Wednesday 05/31/21) to monitor sodium levels which were elevated on morning of discharge most likely d/t dehydration, trending down prior to discharge  ?Please follow w/ GI to address esophageal stricture and options for treatment.  ?Please follow up on the following pending results: none ? ?Home Health: per hospice  ?Equipment/Devices: per hospice ? ?Discharge Condition: good  ?CODE STATUS: DNR  ?Diet recommendation:  ?Diet Orders (From admission, onward)  ? ?  Start     Ordered  ? 05/29/21 0000  Diet - low sodium heart healthy       ? 05/29/21 1531  ? 05/26/21 1554  Diet NPO time specified Except for: Sips with Meds  Diet effective now       ?Comments: Can give meds with pudding.  ?Question:  Except for  Answer:  [name] with Meds  ? 05/26/21 1554  ? ?  ?  ? ?  ? ? ?Brief Narrative / Hospital Course:  ?To ED 05/23/21 initially in AM for dysphagia w/ N/V, underwent EGD which demonstrated fluid in lower third of esophagus, excessive gastric fluid, no specimens collected, patient tolerated the procedure poorly due to respiratory instability with hypoxia however recovered in PACU and was discharged home, experienced worsening shortness of breath, dyspnea on exertion, cough and presented to the ED again in the evening 05/23/2021. ?Chest x-ray showed extensive airspace disease throughout left lung, concerning for aspiration pneumonia.  Treated with Rocephin, Zithromax, nebulized albuterol.  Admitted for treatment of aspiration pneumonia and AKI on stage IIIa CKD, acute on chronic respiratory failure with hypoxia.   ?05/10: Antibiotics were changed to Unasyn, seen by speech therapy, started back on diet,  ?worsening respiratory status 05/11 and made n.p.o. again due to concerns of recurrent aspiration.  ?05/12 CT chest concerning for multifocal pneumonia, bilateral lung opacities left greater than right, concern for mediastinal adenopathy, esophagus possibly dilated/filled with food debris, obstruction could not be excluded.  Spoke with GI, she is high risk for another EGD/anesthesia, recommended esophagram in another 24 to 48 hours, agree that she should be n.p.o. however family insists that she is tolerating medications crushed in pudding, discussed directly with daughter regarding risk of aspiration even if she is tolerating it now, daughter believes that this risk is acceptable.  ?Needing to wait until 05/29/21 for esophogram given non-urgent and no radiologist in-house over the weekend. Esophogram showed lower esophageal stricture, esophageal spasm, no tracheal aspiration.  Lengthy discussion with daughter at bedside regarding esophageal stricture and subsequent retention/accumulation of p.o. food/drink in esophagus predisposing to aspiration, complicated by esophageal spasm. Hypernatremic in the AM likely d/t dehydration, daughter was offered additional night of monitoring and IV fluids to correct the sodium, however she feels comfortable to take patient home w/ close follow-up and in shared decision making this was felt to be reasonable as long as some IV fluids were given and sodium was trending down after this. Daughter is reluctant for IV fluids given hx CHF. Trending down at time of discharge.  ?  ?Consultants:  ?GI ?  ?Procedures: ?EGD 05/23/2021 (prior to admission)  ?  ? ? ? ? ?Discharge Diagnoses: ?Principal Problem: ?  Aspiration pneumonia ([place]) ?Active Problems: ?  Acute kidney injury superimposed on chronic kidney disease ([place]) ?  Acute on chronic respiratory failure with hypoxia ([facility]) ?  Uncontrolled type 2  diabetes mellitus with hyperglycemia, with long-term current use of insulin ([place]) ?  Acquired hypothyroidism ?  Essential hypertension ?  GERD (gastroesophageal reflux disease) ?  Coronary artery disease without angina pectoris ?  Hypernatremia ? ? ? ?Assessment & Plan: ?Aspiration pneumonia ([place]) ?IV Rocephin and Zithromax transitioned to po amoxicciln-clavulanate  ?mucolytic's as well as bronchodilator therapy. ?Esophagram 05/29/2021: Demonstrating no tracheal aspiration, positive esophageal spasm and severe stricture at distal esophagus. ?d/w family, but long-term prognosis is poor, it is likely that there will be recurrent aspiration issues with inherent complications.  Lengthy discussion with daughter at bedside regarding esophageal stricture and subsequent retention/accumulation of p.o. food/drink in esophagus predisposing to aspiration, complicated by esophageal spasm ? ? ?Acquired hypothyroidism ?continue Synthroid. ? ?Acute kidney injury superimposed on chronic kidney disease ([place]) ?AKI superimposed on stage IIIa chronic kidney disease. ?IV normal saline. ? follow BMP. ? hold off nephrotoxins as able ? ?Acute on chronic respiratory failure with hypoxia (H[facility]) ?Supplemental O2  ? ?Uncontrolled type 2 diabetes mellitus with hyperglycemia, with long-term current use of insulin ([place]) ?Basal coverage + SSI ? ?Essential hypertension ?Resume home meds  ? ?Coronary artery disease without angina pectoris ?as needed sublingual nitroglycerin and Zetia. ? ?GERD (gastroesophageal reflux disease) ?PPI continued ? ?Hypernatremia ?Appears IV fluids have been off, patient is n.p.o. except small portions with medications, discussed with daughter certainly option to keep another night for correction of this and close monitoring (trending down prior to discharge)  ?In shared decision making after discussion of risks versus benefits, daughter opts for small dose IV fluids here and recheck sodium levels prior to discharge home,  still planning for discharge home as long as they are trending down.  She says should be able to follow-up to get blood drawn in the next couple of days with PCP or with hospice, I said that would have to be up to them I am not able to order outpatient labs ? ? ? ? ? ?Discharge Instructions ? ?Discharge Instructions   ? ? Diet - low sodium heart healthy   Complete by: As directed ?  ? Discharge instructions   Complete by: As directed ?  ? Please recheck sodium levels tomorrow or Wednesday ?Please administer small frequent portions of food to avoid accumulation in the esophagus and help reduce risk of aspiration.  ?Please follow up w/ Dr Ardis Hughs (GI) regarding questions about dilatation of the esophagus, feeding tube placement, etc.  ? Increase activity slowly   Complete by: As directed ?  ? ?  ? ?Allergies as of 05/29/2021   ? ?   Reactions  ? Pravachol [pravastatin] Other (See Comments)  ? States she refused due to side effects  ? ?  ? ?  ?Medication List  ?  ? ?STOP taking these medications   ? ?Acetaminophen-Codeine 300-30 MG tablet ?  ?benzonatate 100 MG capsule ?Commonly known as: TESSALON ?  ?docusate sodium 100 MG capsule ?Commonly known as: COLACE ?  ?insulin lispro 100 UNIT/ML KwikPen ?Commonly known as: HumaLOG KwikPen ?  ? ?  ? ?TAKE these medications   ? ?albuterol (2.5 MG/3ML) 0.083% nebulizer solution ?Commonly known as: PROVENTIL ?Take 3 mLs (2.5 mg total) by nebulization every 6 (six) hours as needed for wheezing or shortness of breath. ?  ?albuterol 108 (90 Base) MCG/ACT inhaler ?  Commonly known as: VENTOLIN HFA ?Inhale 2 puffs into the lungs every 6 (six) hours as needed for wheezing or shortness of breath. ?  ?amitriptyline 100 MG tablet ?Commonly known as: ELAVIL ?Take 1 tablet (100 mg total) by mouth at bedtime. ?What changed: how much to take ?  ?amoxicillin-clavulanate 500-125 MG tablet ?Commonly known as: AUGMENTIN ?Take 1 tablet (500 mg total) by mouth 2 (two) times daily for 5 doses. ?   ?ascorbic acid 500 MG tablet ?Commonly known as: VITAMIN C ?Take 500 mg by mouth daily. ?  ?aspirin 81 MG EC tablet ?Take 1 tablet (81 mg total) by mouth daily. Swallow whole. ?  ?Baclofen 5 MG Tabs ?Take 2.5-5 m

## 2021-05-29 NOTE — Progress Notes (Signed)
Daughter, Starla Link notified that EMS had come and would be bringing patient to her home. Acknowledged. Windy Carina, RN 10:09 PM 05/29/2021  ?

## 2021-05-29 NOTE — Progress Notes (Signed)
EMS to transport patient home. Vital signs taken. Patient cleaned up from BM. All patient belongings given to EMS including pt blanket. No noted IV, dayshift d/c'd.  ?

## 2021-06-07 ENCOUNTER — Encounter: Payer: Self-pay | Admitting: Internal Medicine

## 2021-06-08 ENCOUNTER — Telehealth: Payer: Self-pay | Admitting: Skilled Nursing Facility1

## 2021-06-08 NOTE — Telephone Encounter (Signed)
Reason for visit: Advice Only   RD asked to consult on Katlyne Sebest[employer] by [employer]  Dietitian spoke with [name] pts daughter via telephone offering specific advice to help her care for her mother with the goal of no further weight loss and weight gain as well as appropriate preparation of her mothers meals.   Per SLP in hospital: minced foods (Dysphagia II) with nectar thickened liquids  SIT UPRIGHT DURING MEALS  Calories estimated between 1200-1500 for weight gain to 135-140 pounds according to weights seen in Epic   Possible high calorie supplements based on tolerance inclusive of blood sugar numbers: -very high calorie boost  -[employer] glucose support 1.2 -Thrive  -magic cup  [email or web address].com/product/magic-cup-frozen-desserts/   Meal timing:  -offer something with calories whether a meal or a supplement every 3 hours   -add cheese, butter, oil, mayonnaise, yogurt, sour cream, or alfredo to foods to increase calories (mix well, no seperation of liquid and solid  -reduce plain water focus on consuming items with calories   -add her favorite sauce or spice to increase palatability of minced foods  -keep in mind the proper size of minced food fits in between fork prongs  -remove all liquid from food when mincing (no separation of solid mass and liquid in one bite)  -bananas are a good idea  -make oatmeal with high calorie supplement  -stop giving your mother regular ice cream as it is not nectar thickened -thicken all liquids to nectar consistency  -protein only supplements are not appropriate for your mother she needs high calorie meal replacement supplements   For Emma's comfort Dietitian Sent: [email or web address]_[email or web address]  All information presented sent via e-mail to [facility] e-mail

## 2021-06-29 ENCOUNTER — Telehealth: Payer: Self-pay

## 2021-06-29 NOTE — Telephone Encounter (Signed)
Patient daughter Melina Modena) called in wanting to know why nobody called her about the MI her mother had back in 12/2020. The daughter stated someone called her today to tell her we had no connection with the monitor so she plugged it back in and a transmission was sent. She still has questions about what was seen on the transmission.

## 2021-06-29 NOTE — Telephone Encounter (Signed)
Long conversation with daughter regarding patient's hospice status and need for facility care next week to give daughter respite relief. Daughter does not want to take patients remote monitor to facility. Advised daughter that patient's device would store information that would be downloaded once patient returns home in 5 days. Daughter states patient is a DNR. Daughter also had questions related to patients PPM and what will happen as patient nears end of life. Advised daughter that the device will not extend any type of suffering or extend the dying process. Daughter very appreciative of information.

## 2021-07-12 ENCOUNTER — Ambulatory Visit (INDEPENDENT_AMBULATORY_CARE_PROVIDER_SITE_OTHER)

## 2021-07-12 DIAGNOSIS — I442 Atrioventricular block, complete: Secondary | ICD-10-CM | POA: Diagnosis not present

## 2021-07-13 LAB — CUP PACEART REMOTE DEVICE CHECK
Battery Remaining Longevity: 115 mo
Battery Remaining Percentage: 93 %
Battery Voltage: 3.01 V
Brady Statistic AP VP Percent: 2.3 %
Brady Statistic AP VS Percent: 1 %
Brady Statistic AS VP Percent: 98 %
Brady Statistic AS VS Percent: 1 %
Brady Statistic RA Percent Paced: 2.2 %
Brady Statistic RV Percent Paced: 99 %
Date Time Interrogation Session: 20230628020014
Implantable Lead Implant Date: 20220928
Implantable Lead Implant Date: 20220928
Implantable Lead Location: 753859
Implantable Lead Location: 753860
Implantable Pulse Generator Implant Date: 20220928
Lead Channel Impedance Value: 400 Ohm
Lead Channel Impedance Value: 560 Ohm
Lead Channel Pacing Threshold Amplitude: 0.75 V
Lead Channel Pacing Threshold Amplitude: 1 V
Lead Channel Pacing Threshold Pulse Width: 0.4 ms
Lead Channel Pacing Threshold Pulse Width: 0.4 ms
Lead Channel Sensing Intrinsic Amplitude: 1.3 mV
Lead Channel Sensing Intrinsic Amplitude: 12 mV
Lead Channel Setting Pacing Amplitude: 1.25 V
Lead Channel Setting Pacing Amplitude: 1.75 V
Lead Channel Setting Pacing Pulse Width: 0.4 ms
Lead Channel Setting Sensing Sensitivity: 4 mV
Pulse Gen Model: 2272
Pulse Gen Serial Number: 3942268

## 2021-07-20 ENCOUNTER — Other Ambulatory Visit: Payer: Self-pay | Admitting: Internal Medicine

## 2021-07-20 DIAGNOSIS — I442 Atrioventricular block, complete: Secondary | ICD-10-CM

## 2021-07-20 DIAGNOSIS — I10 Essential (primary) hypertension: Secondary | ICD-10-CM

## 2021-07-26 ENCOUNTER — Encounter: Payer: Self-pay | Admitting: Internal Medicine

## 2021-07-26 ENCOUNTER — Ambulatory Visit (INDEPENDENT_AMBULATORY_CARE_PROVIDER_SITE_OTHER): Admitting: Internal Medicine

## 2021-07-26 VITALS — BP 130/76 | HR 60 | Ht 64.0 in | Wt 133.0 lb

## 2021-07-26 DIAGNOSIS — E1142 Type 2 diabetes mellitus with diabetic polyneuropathy: Secondary | ICD-10-CM

## 2021-07-26 DIAGNOSIS — N1831 Chronic kidney disease, stage 3a: Secondary | ICD-10-CM

## 2021-07-26 DIAGNOSIS — Z794 Long term (current) use of insulin: Secondary | ICD-10-CM

## 2021-07-26 DIAGNOSIS — E1165 Type 2 diabetes mellitus with hyperglycemia: Secondary | ICD-10-CM | POA: Diagnosis not present

## 2021-07-26 DIAGNOSIS — E039 Hypothyroidism, unspecified: Secondary | ICD-10-CM | POA: Diagnosis not present

## 2021-07-26 DIAGNOSIS — E1122 Type 2 diabetes mellitus with diabetic chronic kidney disease: Secondary | ICD-10-CM | POA: Diagnosis not present

## 2021-07-26 LAB — POCT GLYCOSYLATED HEMOGLOBIN (HGB A1C): Hemoglobin A1C: 8.6 % — AB (ref 4.0–5.6)

## 2021-07-26 LAB — TSH: TSH: 5.18 u[IU]/mL (ref 0.35–5.50)

## 2021-07-26 NOTE — Patient Instructions (Addendum)
-   Continue Lantus 14 Units bedtime  -Take Humalog 10-12 units with each meal  - Bedtime Humalog between 4-5 units if sugar over 250           HOW TO TREAT LOW BLOOD SUGARS (Blood sugar LESS THAN 70 MG/DL) Please follow the RULE OF 15 for the treatment of hypoglycemia treatment (when your (blood sugars are less than 70 mg/dL)   STEP 1: Take 15 grams of carbohydrates when your blood sugar is low, which includes:  3-4 GLUCOSE TABS  OR 3-4 OZ OF JUICE OR REGULAR SODA OR ONE TUBE OF GLUCOSE GEL    STEP 2: RECHECK blood sugar in 15 MINUTES STEP 3: If your blood sugar is still low at the 15 minute recheck --> then, go back to STEP 1 and treat AGAIN with another 15 grams of carbohydrates.

## 2021-07-26 NOTE — Progress Notes (Signed)
Name: Amy Calhoun  Age/ Sex: 88 y.o., female   MRN/ DOB: [id number], 09-06-1928     PCP: Alvester Chou, NP   Reason for Endocrinology Evaluation: Type 2 Diabetes Mellitus  Initial Endocrine Consultative Visit: 04/15/2018    PATIENT IDENTIFIER: Amy Calhoun is a 88 y.o. female with a past medical history of T2DM, Dyslipidemia, Hypothyroidism , heart block S/P pacemaker and HTN . The patient has followed with Endocrinology clinic since 04/15/2018 for consultative assistance with management of her diabetes.   Patient used to be cared for by her other daughter in [place] but daughter had passed due to a motorcycle accident and Amy Calhoun brought mother to [facility] in 2017.   DIABETIC HISTORY:  Amy Calhoun was diagnosed with DM at age 67. She has been on insulin since her diagnosis.She was briefly on glipizide but this was stopped in March, 2020. Pt did not recall any other oral glycemic agents. Her hemoglobin A1c has ranged from 6.5 % in 2018, peaking at 8.9%  In 2018.  Daughter would like to explore putting mother on glycemic agents, she is concerned about having MDI regimen as mother gets older and worried about future dexterity.   On her initial visit to our clinic, she was on Humulin-N BID, Humulin-R per SS and Tradjenta.  We stopped Tradjenta, started Trulicity in March, [id number], with reduction in insulin doses.   In 09/2018 Replaced Humulin-N with Lantus   SUBJECTIVE:   During the last visit (12/12/2020): A1c 8.6% no changes     Today (07/26/2021): Amy Calhoun is here with daughter Amy Calhoun for a follow up on diabetes .  She checks her blood sugars multiple times a day through the freestyle libre. The patient has not had hypoglycemic episodes since the last clinic visit.    She was hospitalized 05/29/2021 for aspiration PNA and dysphagia    She has mild cough  No nausea, vomiting, or diarrhea Continues to be under hospice care    HOME DIABETES REGIMEN:  Lantus 14 units daily   Humalog 8 units TID QAC-daughter gives between 8-10 units CF: Novolog ( BG- 130/60)-not using   CONTINUOUS GLUCOSE MONITORING RECORD INTERPRETATION    Dates of Recording: 6/29-7/12/2021  Sensor description:Freestyle Libre  Results statistics:   CGM use % of time 83  Average and SD 221/30.5  Time in range  32%  % Time Above 180 32  % Time above 250 36  % Time Below target 0     Glycemic patterns summary: Bg's optimal overnight and hyperglycemia noted during the day  Hypoglycemic episodes occurred N/A recently  Overnight periods: trends down     HISTORY:  Past Medical History:  Past Medical History:  Diagnosis Date   Allergy    Ambulates with cane    Anemia    At high risk for falls    Cataract    removed both eyes   Chronic kidney disease 08/07/2016   Chronic renal failure, stage III   Coronary artery disease    Severe, diffuse RCA disease by cath (09/2020).  NSTEMI (12/2020) - medical therapy recommended   Depression    Esophageal dysphagia    Gastric ulcer    GERD (gastroesophageal reflux disease)    Hyperlipidemia    Hypertension    Hypothyroidism    IDDM (insulin dependent diabetes mellitus)    Ischemic colitis ([place])    Neuromuscular disorder (HCC)    neuropathy   Osteoarthritis    Ulcer of esophagus  Past Surgical History:  Past Surgical History:  Procedure Laterality Date   ABDOMINAL HYSTERECTOMY     CATARACT EXTRACTION, BILATERAL     CHOLECYSTECTOMY     COLONOSCOPY  2010   ESOPHAGEAL DILATION  08/07/2016   usually a couple times a year, last time 07/30/16   ESOPHAGOGASTRODUODENOSCOPY (EGD) WITH PROPOFOL N/A 05/23/2021   Procedure: ESOPHAGOGASTRODUODENOSCOPY (EGD) WITH PROPOFOL;  Surgeon: [name], MD;  Location: [facility] ENDOSCOPY;  Service: Gastroenterology;  Laterality: N/A;   GALLBLADDER SURGERY     LEFT HEART CATH AND CORONARY ANGIOGRAPHY N/A 10/11/2020   Procedure: LEFT HEART CATH AND CORONARY ANGIOGRAPHY;  Surgeon: Yvonne Kendall, MD;   Location: [facility] INVASIVE CV LAB;  Service: Cardiovascular;  Laterality: N/A;   LUMBAR LAMINECTOMY/DECOMPRESSION MICRODISCECTOMY N/A 08/10/2016   Procedure: BILATERAL HEMILAMINECTOMY LUMBAR THREE-FOUR,LUMBAR FOUR-FIVE,LEFT LUMBAR FIVE-SACRAL ONE HEMILAMINECTOMY AND DECOMPRESSION;  Surgeon: Coletta Memos, MD;  Location: [facility] OR;  Service: Neurosurgery;  Laterality: N/A;   PACEMAKER IMPLANT N/A 10/12/2020   Procedure: PACEMAKER IMPLANT;  Surgeon: Lanier Prude, MD;  Location: [facility] INVASIVE CV LAB;  Service: Cardiovascular;  Laterality: N/A;   TEMPORARY PACEMAKER N/A 10/11/2020   Procedure: TEMPORARY PACEMAKER;  Surgeon: Yvonne Kendall, MD;  Location: [facility] INVASIVE CV LAB;  Service: Cardiovascular;  Laterality: N/A;   THYROIDECTOMY     UPPER GASTROINTESTINAL ENDOSCOPY     Social History:  reports that she has never smoked. She has never used smokeless tobacco. She reports that she does not drink alcohol and does not use drugs. Family History:  Family History  Problem Relation Age of Onset   Diabetes Mother    Arthritis Mother    Hyperlipidemia Mother    Mental illness Mother    Heart disease Father    Mental illness Sister    Arthritis Maternal Grandmother    Arthritis Maternal Grandfather    Colon cancer Neg Hx    Stomach cancer Neg Hx    Esophageal cancer Neg Hx    Rectal cancer Neg Hx    Colon polyps Neg Hx      HOME MEDICATIONS: Allergies as of 07/26/2021       Reactions   Pravachol [pravastatin] Other (See Comments)   States she refused due to side effects        Medication List        Accurate as of July 26, 2021  3:43 PM. If you have any questions, ask your nurse or doctor.          albuterol (2.5 MG/3ML) 0.083% nebulizer solution Commonly known as: PROVENTIL Take 3 mLs (2.5 mg total) by nebulization every 6 (six) hours as needed for wheezing or shortness of breath.   albuterol 108 (90 Base) MCG/ACT inhaler Commonly known as: VENTOLIN HFA Inhale 2 puffs into  the lungs every 6 (six) hours as needed for wheezing or shortness of breath.   amitriptyline 100 MG tablet Commonly known as: ELAVIL Take 1 tablet (100 mg total) by mouth at bedtime. What changed: how much to take   ascorbic acid 500 MG tablet Commonly known as: VITAMIN C Take 500 mg by mouth daily.   aspirin EC 81 MG tablet Take 1 tablet (81 mg total) by mouth daily. Swallow whole.   Baclofen 5 MG Tabs Take 2.5-5 mg by mouth 2 (two) times daily as needed (spasms).   Bariatric Rollator Misc Use as needed   budesonide-formoterol 80-4.5 MCG/ACT inhaler Commonly known as: SYMBICORT Inhale 2 puffs into the lungs 2 (two) times daily.   DODEX  IJ Inject as directed every 30 (thirty) days.   ezetimibe 10 MG tablet Commonly known as: ZETIA Take 1 tablet (10 mg total) by mouth daily.   ferrous sulfate 324 MG Tbec Take 324 mg by mouth daily with breakfast.   FreeStyle Libre 14 Day Sensor Misc 1 Package by Does not apply route every 14 (fourteen) days.   furosemide 20 MG tablet Commonly known as: LASIX TAKE 1/2 TABLET BY MOUTH DAILY   gabapentin 100 MG capsule Commonly known as: NEURONTIN Take 2 capsules (200 mg total) by mouth 3 (three) times daily.   glucagon 1 MG injection Inject 1 mg into the muscle once as needed for up to 1 dose. Hypoglycemia If she is not tolerating PO.   guaiFENesin 100 MG/5ML liquid Commonly known as: ROBITUSSIN Take 10 mLs by mouth every 4 (four) hours as needed for cough or to loosen phlegm.   HumaLOG 100 UNIT/ML injection Generic drug: insulin lispro Inject 8 Units into the skin 3 (three) times daily before meals.   HYDROcodone-acetaminophen 10-325 MG tablet Commonly known as: NORCO Take 1 tablet by mouth every 6 (six) hours as needed.   Insulin Pen Needle 32G X 4 MM Misc 1 Device by Does not apply route in the morning, at noon, in the evening, and at bedtime.   isosorbide mononitrate 30 MG 24 hr tablet Commonly known as: IMDUR Take 1  tablet (30 mg total) by mouth daily.   Lantus SoloStar 100 UNIT/ML Solostar Pen Generic drug: insulin glargine Inject 16 Units into the skin at bedtime. What changed: how much to take   levothyroxine 150 MCG tablet Commonly known as: SYNTHROID Take 1 tablet (150 mcg total) by mouth daily before breakfast. 2 tabs on Sundays and 1 tablet the rest of the week   losartan 50 MG tablet Commonly known as: COZAAR Take 0.5 tablets (25 mg total) by mouth daily.   nitroGLYCERIN 0.4 MG SL tablet Commonly known as: NITROSTAT Place 1 tablet (0.4 mg total) under the tongue every 5 (five) minutes x 3 doses as needed for chest pain.   omeprazole 40 MG capsule Commonly known as: PRILOSEC TAKE 1 CAPSULE TWICE A DAY   PRESERVISION AREDS PO Take 1 tablet by mouth daily.   prochlorperazine 10 MG tablet Commonly known as: COMPAZINE Take 10 mg by mouth every 4 (four) hours as needed.   Thick-It Powd Generic drug: STARCH-MALTO DEXTRIN See admin instructions.   Vitamin D 50 MCG (2000 UT) tablet Take 2,000 Units by mouth daily.   Wixela Inhub 100-50 MCG/ACT Aepb Generic drug: fluticasone-salmeterol Inhale 1 puff into the lungs 2 (two) times daily.         OBJECTIVE:   Vital Signs: BP 130/76 (BP Location: Left Arm, Patient Position: Sitting, Cuff Size: Small)   Pulse 60   Ht 5\' 4"  (1.626 m)   Wt 133 lb (60.3 kg)   BMI 22.83 kg/m   Wt Readings from Last 3 Encounters:  07/26/21 133 lb (60.3 kg)  05/23/21 134 lb (60.8 kg)  05/23/21 134 lb (60.8 kg)     Exam: General: Pt appears well and is in NAD  Lungs: Clear with good BS bilat   Heart: RRR   Extremities: No pretibial edema.   DM FOOT EXAM 09/21/2020 The skin of the feet is intact without sores or ulcerations. The pedal pulses are decreased. The sensation is absent  to a screening 5.07, 10 gram monofilament bilaterally    DATA REVIEWED:  Lab Results  Component  Value Date   HGBA1C 8.6 (A) 07/26/2021   HGBA1C 9.1 (A)  12/12/2020   HGBA1C 9.4 (A) 09/21/2020   Lab Results  Component Value Date   MICROALBUR 1.8 03/09/2020   LDLCALC 71 02/13/2021   CREATININE 1.22 (H) 05/29/2021   Lab Results  Component Value Date   MICRALBCREAT 4.5 03/09/2020     Lab Results  Component Value Date   CHOL 134 02/13/2021   HDL 47 02/13/2021   LDLCALC 71 02/13/2021   TRIG 82 02/13/2021   CHOLHDL 2.9 02/13/2021       Latest Reference Range & Units 07/26/21 14:08  TSH 0.35 - 5.50 uIU/mL 5.18    ASSESSMENT / PLAN / RECOMMENDATIONS:   1) Insulin-Dependent, Poorly controlled, With CKD III and neuropathic complications - Most recent A1c of 9.1 %. Goal A1c < 8.0%.    - In review of her CGM download she has been noted with hyperglycemia during the day  - Pt is currently living with her daughter  -Due to recurrent dysphagia, she has been advised to give prandial insulin immediately after the meal to make sure patient has swallowed all food. -Daughter is not using correction scale provided and she has come up with her on correction scale that is more simplified for the daughter to follow, and review of her CGM download things have been looking good and I would not make any changes except to the prandial dose of insulin as below    MEDICATIONS:  Continue  Lantus 14 Units QHS Continue  Humalog 10 units if BG  <200, and 12 units if BG >200 mg/dL  Continue to give Humalog 4-5 units at bedtime if BG >250 mg/dL  EDUCATION / INSTRUCTIONS: BG monitoring instructions: Patient is instructed to check her blood sugars 4 times a day, before meals and bedtime. Call [facility] if: BG persistently < 70  I reviewed the Rule of 15 for the treatment of hypoglycemia in detail with the patient. Literature supplied.   2. Hypothyroidism:   -TSH elevated, despite increasing levothyroxine, will increase levothyroxine as below -It is interesting that her TSH has started to increase since the fall  2022  Medication Stop levothyroxine 150 mcg Start levothyroxine 175 mcg daily  Repeat TSH in 2 months    F/U in 6 months    Signed electronically by: Mack Guise, MD  [facility] Endocrinology  [facility] [address] [place], [place] [address] Phone: [phone] FAX: [phone]   CC: Alvester Chou, NP [address] [place] [address] Phone: [phone]  Fax: [phone]  Return to Endocrinology clinic as below: Future Appointments  Date Time Provider [place]  08/17/2021  8:20 AM End, Harrell Gave, MD [facility]  10/11/2021  7:00 AM [facility] DEVICE REMOTES [facility]  10/18/2021  1:40 PM Vickie Epley, MD [facility]  01/10/2022  7:00 AM [facility] DEVICE REMOTES [facility]  01/26/2022  1:20 PM Kevina Piloto, Melanie Crazier, MD [facility] None  04/11/2022  7:00 AM [facility] DEVICE REMOTES [facility]

## 2021-07-27 ENCOUNTER — Encounter: Payer: Self-pay | Admitting: Internal Medicine

## 2021-07-27 ENCOUNTER — Telehealth: Payer: Self-pay | Admitting: Internal Medicine

## 2021-07-27 MED ORDER — LEVOTHYROXINE SODIUM 175 MCG PO TABS: 175.0000 ug | ORAL_TABLET | Freq: Every day | ORAL | 3 refills | Status: DC

## 2021-07-27 NOTE — Telephone Encounter (Signed)
Please let the daughter [name] know that mother's thyroid test shows that she is not on enough levothyroxine   Please ask her to stop levothyroxine 150 and start the next dose up which is levothyroxine 175 mcg daily  Please schedule the patient for lab appointment in 2 months to have it rechecked  Abby Raelyn Mora, MD  [facility] Endocrinology  [facility] [address] [place], [place] [address] Phone: [phone] FAX: [phone]

## 2021-07-28 ENCOUNTER — Other Ambulatory Visit: Payer: Self-pay

## 2021-07-28 MED ORDER — LEVOTHYROXINE SODIUM 175 MCG PO TABS: 175.0000 ug | ORAL_TABLET | Freq: Every day | ORAL | 3 refills | Status: DC

## 2021-07-28 NOTE — Telephone Encounter (Signed)
Patient daughter notified and will pick up new medication

## 2021-08-01 NOTE — Progress Notes (Signed)
Remote pacemaker transmission.   

## 2021-08-16 NOTE — Progress Notes (Unsigned)
Follow-up Outpatient Visit Date: 08/17/2021  Primary Care Provider: Alvester Chou, Amy Calhoun [address] [place] [address]  Chief Complaint: Follow-up coronary artery disease and complete heart block  HPI:  Amy Calhoun is a 88 y.o. female with history of  single-vessel CAD with diffuse disease of up to 90% affecting relatively small RCA, complete heart block status post dual chamber pacemaker (09/2020), hypertension, hyperlipidemia, diabetes mellitus complicated by peripheral neuropathy, orthostatic hypotension, hypothyroidism, and GERD, who presents for follow-up of coronary artery disease and heart block.  I last saw her in 02/2021, at which time her energy was gradually improving.  She was hospitalized in May with aspiration pneumonia following EGD.  She was discharged home with hospice.  Today, Amy Calhoun reports that she has actually been doing fairly well.  Her daughter, who provides most of the history, notes that Amy Calhoun has been getting stronger.  She has even traveled out of town for a concert.  About every 3 weeks, she complains of chest pain that seems to be accompanied by elevated blood pressures and low oxygen readings.  The pain typically resolves after 1 or 2 sublingual nitroglycerin and the hypoxia improves with supplemental oxygen that she has at home through hospice.  She is now taking furosemide 20 mg daily that seems to keep her leg edema well controlled.  She has not had any palpitations or lightheadedness.  She continues to have episodic dysphagia and choking every few weeks.  This does not reliably coincide with the aforementioned chest pain/hypoxia episodes.  --------------------------------------------------------------------------------------------------  Past Medical History:  Diagnosis Date   Allergy    Ambulates with cane    Anemia    At high risk for falls    Cataract    removed both eyes   Chronic kidney disease 08/07/2016   Chronic renal failure, stage III    Coronary artery disease    Severe, diffuse RCA disease by cath (09/2020).  NSTEMI (12/2020) - medical therapy recommended   Depression    Esophageal dysphagia    Gastric ulcer    GERD (gastroesophageal reflux disease)    Hyperlipidemia    Hypertension    Hypothyroidism    IDDM (insulin dependent diabetes mellitus)    Ischemic colitis ([place])    Neuromuscular disorder (HCC)    neuropathy   Osteoarthritis    Ulcer of esophagus    Past Surgical History:  Procedure Laterality Date   ABDOMINAL HYSTERECTOMY     CATARACT EXTRACTION, BILATERAL     CHOLECYSTECTOMY     COLONOSCOPY  2010   ESOPHAGEAL DILATION  08/07/2016   usually a couple times a year, last time 07/30/16   ESOPHAGOGASTRODUODENOSCOPY (EGD) WITH PROPOFOL N/A 05/23/2021   Procedure: ESOPHAGOGASTRODUODENOSCOPY (EGD) WITH PROPOFOL;  Surgeon: Jonathon Bellows, MD;  Location: [facility];  Service: Gastroenterology;  Laterality: N/A;   GALLBLADDER SURGERY     LEFT HEART CATH AND CORONARY ANGIOGRAPHY N/A 10/11/2020   Procedure: LEFT HEART CATH AND CORONARY ANGIOGRAPHY;  Surgeon: Nelva Bush, MD;  Location: [facility];  Service: Cardiovascular;  Laterality: N/A;   LUMBAR LAMINECTOMY/DECOMPRESSION MICRODISCECTOMY N/A 08/10/2016   Procedure: BILATERAL HEMILAMINECTOMY LUMBAR THREE-FOUR,LUMBAR FOUR-FIVE,LEFT LUMBAR FIVE-SACRAL ONE HEMILAMINECTOMY AND DECOMPRESSION;  Surgeon: Ashok Pall, MD;  Location: [facility];  Service: Neurosurgery;  Laterality: N/A;   PACEMAKER IMPLANT N/A 10/12/2020   Procedure: PACEMAKER IMPLANT;  Surgeon: Vickie Epley, MD;  Location: [facility];  Service: Cardiovascular;  Laterality: N/A;   TEMPORARY PACEMAKER N/A 10/11/2020  Procedure: TEMPORARY PACEMAKER;  Surgeon: Yvonne Kendall, MD;  Location: [facility];  Service: Cardiovascular;  Laterality: N/A;   THYROIDECTOMY     UPPER GASTROINTESTINAL ENDOSCOPY      Current Meds  Medication Sig   albuterol (PROVENTIL) (2.5 MG/3ML)  0.083% nebulizer solution Take 3 mLs (2.5 mg total) by nebulization every 6 (six) hours as needed for wheezing or shortness of breath.   albuterol (VENTOLIN HFA) 108 (90 Base) MCG/ACT inhaler Inhale 2 puffs into the lungs every 6 (six) hours as needed for wheezing or shortness of breath.   amitriptyline (ELAVIL) 100 MG tablet Take 1 tablet (100 mg total) by mouth at bedtime.   ascorbic acid (VITAMIN C) 500 MG tablet Take 500 mg by mouth daily.   aspirin EC 81 MG EC tablet Take 1 tablet (81 mg total) by mouth daily. Swallow whole.   baclofen 5 MG TABS Take 2.5-5 mg by mouth 2 (two) times daily as needed (spasms).   budesonide-formoterol (SYMBICORT) 80-4.5 MCG/ACT inhaler Inhale 2 puffs into the lungs 2 (two) times daily.   Cholecalciferol (VITAMIN D) 2000 units tablet Take 2,000 Units by mouth daily.    Continuous Blood Gluc Sensor (FREESTYLE LIBRE 14 DAY SENSOR) MISC 1 Package by Does not apply route every 14 (fourteen) days.   Cyanocobalamin (DODEX IJ) Inject as directed every 30 (thirty) days.   ezetimibe (ZETIA) 10 MG tablet Take 1 tablet (10 mg total) by mouth daily.   ferrous sulfate 324 MG TBEC Take 324 mg by mouth daily with breakfast.   furosemide (LASIX) 20 MG tablet Take 20 mg by mouth daily.   gabapentin (NEURONTIN) 100 MG capsule Take 2 capsules (200 mg total) by mouth 3 (three) times daily.   glucagon 1 MG injection Inject 1 mg into the muscle once as needed for up to 1 dose. Hypoglycemia If she is not tolerating PO.   guaiFENesin (ROBITUSSIN) 100 MG/5ML liquid Take 10 mLs by mouth every 4 (four) hours as needed for cough or to loosen phlegm.   HYDROcodone-acetaminophen (NORCO) 10-325 MG tablet Take 1 tablet by mouth every 6 (six) hours as needed.   insulin glargine (LANTUS SOLOSTAR) 100 UNIT/ML Solostar Pen Inject 16 Units into the skin at bedtime.   insulin lispro (HUMALOG) 100 UNIT/ML injection Inject 10 Units into the skin 3 (three) times daily before meals.   Insulin Pen Needle  32G X 4 MM MISC 1 Device by Does not apply route in the morning, at noon, in the evening, and at bedtime.   isosorbide mononitrate (IMDUR) 30 MG 24 hr tablet Take 1 tablet (30 mg total) by mouth daily.   levothyroxine (SYNTHROID) 175 MCG tablet Take 1 tablet (175 mcg total) by mouth daily.   losartan (COZAAR) 50 MG tablet Take 0.5 tablets (25 mg total) by mouth daily.   Misc. Devices (BARIATRIC ROLLATOR) MISC Use as needed   Multiple Vitamins-Minerals (PRESERVISION AREDS PO) Take 1 tablet by mouth daily.   nitroGLYCERIN (NITROSTAT) 0.4 MG SL tablet Place 1 tablet (0.4 mg total) under the tongue every 5 (five) minutes x 3 doses as needed for chest pain.   omeprazole (PRILOSEC) 40 MG capsule TAKE 1 CAPSULE TWICE A DAY   prochlorperazine (COMPAZINE) 10 MG tablet Take 10 mg by mouth every 4 (four) hours as needed.   THICK-IT POWD See admin instructions.   WIXELA INHUB 100-50 MCG/ACT AEPB Inhale 1 puff into the lungs 2 (two) times daily.    Allergies: Pravachol [pravastatin]  Social History  Tobacco Use   Smoking status: Never   Smokeless tobacco: Never  Vaping Use   Vaping Use: Never used  Substance Use Topics   Alcohol use: No   Drug use: No    Family History  Problem Relation Age of Onset   Diabetes Mother    Arthritis Mother    Hyperlipidemia Mother    Mental illness Mother    Heart disease Father    Mental illness Sister    Arthritis Maternal Grandmother    Arthritis Maternal Grandfather    Colon cancer Neg Hx    Stomach cancer Neg Hx    Esophageal cancer Neg Hx    Rectal cancer Neg Hx    Colon polyps Neg Hx     Review of Systems: A 12-system review of systems was performed and was negative except as noted in the HPI.  --------------------------------------------------------------------------------------------------  Physical Exam: BP (!) 120/56 (BP Location: Left Arm, Patient Position: Sitting, Cuff Size: Normal)   Pulse 76   Ht 5\' 4"  (1.626 m)   Wt 132 lb (59.9  kg)   SpO2 97%   BMI 22.66 kg/m   General:  NAD.  Accompanied by her daughter. Neck: No JVD or HJR. Lungs: Clear to auscultation bilaterally without wheezes or crackles. Heart: Regular rate and rhythm without murmurs, rubs, or gallops. Abdomen: Soft, nontender, nondistended. Extremities: No lower extremity edema.  EKG: Normal sinus rhythm with atrially sensed and ventricularly paced rhythm.  Lab Results  Component Value Date   WBC 9.1 05/29/2021   HGB 10.0 (L) 05/29/2021   HCT 32.3 (L) 05/29/2021   MCV 90.2 05/29/2021   PLT 216 05/29/2021    Lab Results  Component Value Date   NA 148 (H) 05/29/2021   K 3.4 (L) 05/29/2021   CL 115 (H) 05/29/2021   CO2 25 05/29/2021   BUN 35 (H) 05/29/2021   CREATININE 1.22 (H) 05/29/2021   GLUCOSE 68 (L) 05/29/2021   ALT 17 05/23/2021    Lab Results  Component Value Date   CHOL 134 02/13/2021   HDL 47 02/13/2021   LDLCALC 71 02/13/2021   TRIG 82 02/13/2021   CHOLHDL 2.9 02/13/2021    --------------------------------------------------------------------------------------------------  ASSESSMENT AND PLAN: Coronary artery disease with stable angina and hyperlipidemia: Overall, Amy Calhoun is doing fairly well though she does have sporadic chest pain every few weeks.  Given her low normal blood pressure today, I am reluctant to escalate isosorbide mononitrate at this time.  I think it is reasonable to continue with as needed nitroglycerin for symptomatic relief.  Given her advanced age, other comorbidities, and unfavorable coronary anatomy, we will not pursue repeat catheterization or PCI attempt.  We will obtain a CMP and lipid panel through home health at the patient's request.  Complete heart block status post pacemaker: No syncope or lightheadedness reported.  EKG today again shows atrially sensed and ventricularly paced rhythm.  Continue ongoing follow-up through the device clinic.  We will reach out to the device clinic to ensure that  routine device checks are authorized through Medicare given concerns voiced by her daughter.  Dysphagia and aspiration pneumonitis: Recurrent dysphagia remains a problem.  I wonder if some of her transient hypoxia and chest pain could also be related to esophageal problems and aspiration.  I encouraged her to continue being careful with her diet.  Chronic HFpEF: Amy Calhoun appears euvolemic today.  Furosemide was increased to 20 mg daily after her hospitalization in May, which I think is reasonable.  I will arrange for a CMP to be performed through home nursing, per the patient's request.  Follow-up: Return to clinic in 6 months  Nelva Bush, MD 08/17/2021 8:36 AM

## 2021-08-17 ENCOUNTER — Ambulatory Visit (INDEPENDENT_AMBULATORY_CARE_PROVIDER_SITE_OTHER): Admitting: Internal Medicine

## 2021-08-17 ENCOUNTER — Encounter: Payer: Self-pay | Admitting: Internal Medicine

## 2021-08-17 VITALS — BP 120/56 | HR 76 | Ht 64.0 in | Wt 132.0 lb

## 2021-08-17 DIAGNOSIS — E1169 Type 2 diabetes mellitus with other specified complication: Secondary | ICD-10-CM | POA: Diagnosis not present

## 2021-08-17 DIAGNOSIS — I5032 Chronic diastolic (congestive) heart failure: Secondary | ICD-10-CM

## 2021-08-17 DIAGNOSIS — J690 Pneumonitis due to inhalation of food and vomit: Secondary | ICD-10-CM

## 2021-08-17 DIAGNOSIS — R1319 Other dysphagia: Secondary | ICD-10-CM | POA: Diagnosis not present

## 2021-08-17 DIAGNOSIS — I442 Atrioventricular block, complete: Secondary | ICD-10-CM

## 2021-08-17 DIAGNOSIS — I25118 Atherosclerotic heart disease of native coronary artery with other forms of angina pectoris: Secondary | ICD-10-CM | POA: Diagnosis not present

## 2021-08-17 DIAGNOSIS — E785 Hyperlipidemia, unspecified: Secondary | ICD-10-CM

## 2021-08-17 MED ORDER — FUROSEMIDE 20 MG PO TABS: 20.0000 mg | ORAL_TABLET | Freq: Every day | ORAL | 1 refills | Status: AC

## 2021-08-17 NOTE — Patient Instructions (Signed)
Medication Instructions:   Your physician recommends that you continue on your current medications as directed. Please refer to the Current Medication list given to you today.  *If you need a refill on your cardiac medications before your next appointment, please call your pharmacy*   Lab Work:  We have given orders today for Lipid panel and CMET to be drawn by Hospice  Testing/Procedures:  None ordered   Follow-Up: At [facility], you and your health needs are our priority.  As part of our continuing mission to provide you with exceptional heart care, we have created designated Provider Care Teams.  These Care Teams include your primary Cardiologist (physician) and Advanced Practice Providers (APPs -  Physician Assistants and Nurse Practitioners) who all work together to provide you with the care you need, when you need it.  We recommend signing up for the patient portal called "MyChart".  Sign up information is provided on this After Visit Summary.  MyChart is used to connect with patients for Virtual Visits (Telemedicine).  Patients are able to view lab/test results, encounter notes, upcoming appointments, etc.  Non-urgent messages can be sent to your provider as well.   To learn more about what you can do with MyChart, go to [email or web address].    Your next appointment:   6 month(s)  The format for your next appointment:   In Person  Provider:   You may see Yvonne Kendall, MD or one of the following Advanced Practice Providers on your designated Care Team:   Nicolasa Ducking, NP Eula Listen, PA-C Cadence Fransico Michael, [place]  Important Information About Sugar

## 2021-08-18 ENCOUNTER — Telehealth: Payer: Self-pay

## 2021-08-18 NOTE — Telephone Encounter (Signed)
-----   Message from Annia Belt, RN sent at 08/17/2021 11:40 AM EDT ----- Regarding: pt question Good morning,  This pt and her daughter were in to see Dr. Okey Dupre today. The daughter has questions regarding a bill she received for a pacemaker interrogation. Can someone please reach out to her to discuss?   Thank you,  Aundra Millet

## 2021-08-18 NOTE — Telephone Encounter (Signed)
Pt and daughter has billing questions. If someone can give them a call please and thank you.

## 2021-08-18 NOTE — Telephone Encounter (Signed)
LMOVM for pt daughter to give me a call back. Left device clinic number in voicemail.

## 2021-08-22 ENCOUNTER — Other Ambulatory Visit: Payer: Self-pay | Admitting: Internal Medicine

## 2021-08-22 NOTE — Telephone Encounter (Signed)
Please advise if ok to refill Losartan last filled by  [facility] Ordering/Authorizing: Sunnie Nielsen, DO

## 2021-08-25 ENCOUNTER — Telehealth: Payer: Self-pay | Admitting: Internal Medicine

## 2021-08-25 ENCOUNTER — Encounter: Payer: Self-pay | Admitting: Internal Medicine

## 2021-08-29 ENCOUNTER — Telehealth: Payer: Self-pay | Admitting: *Deleted

## 2021-08-29 DIAGNOSIS — E875 Hyperkalemia: Secondary | ICD-10-CM

## 2021-08-29 MED ORDER — LOSARTAN POTASSIUM 25 MG PO TABS: 25.0000 mg | ORAL_TABLET | Freq: Every day | ORAL | 0 refills | Status: DC

## 2021-08-29 NOTE — Telephone Encounter (Signed)
Spoke with pt's daughter, notified of lab results and Dr. Serita Kyle recc.  Daughter voiced understanding.  Pt will decr losartan from 50 mg daily to 25 mg daily. Will cut current tablet in half and new Rx sent to pt's pharmacy.  Daughter will try to make sure pt stays well hydrated and will have repeat BMET drawn by [facility] hospice nurse.  Lab orders faxed to [phone] with confirmation received.  Daughter will reach out to pt's endocrinologist regarding glycemic control.   No further questions at this time.

## 2021-08-29 NOTE — Telephone Encounter (Signed)
-----   Message from Yvonne Kendall, MD sent at 08/29/2021 11:11 AM EDT ----- Please let Ms. Gosselin and her daughter know that I have reviewed recent labs.  Blood sugar was severely elevated.  Potassium and creatinine were also slightly abnormal.  LDL was just above goal at 74.  Given reasonable LDL, history of pravastatin intolerance, and ongoing hospice care, I think it is ok to defer rechallenging her with another statin.  I recommend that we decrease losartan to 25 mg daily to see if that helps her potassium.  She should stay well hydrated and reach out to her PCP (or whoever manages her DM) for further instructions about improving her glycemic control.  We should repeat a BMP in ~2 weeks (this can be drawn through home health if the patient prefers).

## 2021-09-21 ENCOUNTER — Other Ambulatory Visit: Payer: Self-pay | Admitting: Internal Medicine

## 2021-10-11 ENCOUNTER — Ambulatory Visit (INDEPENDENT_AMBULATORY_CARE_PROVIDER_SITE_OTHER)

## 2021-10-11 DIAGNOSIS — I442 Atrioventricular block, complete: Secondary | ICD-10-CM

## 2021-10-13 LAB — CUP PACEART REMOTE DEVICE CHECK
Battery Remaining Longevity: 112 mo
Battery Remaining Percentage: 91 %
Battery Voltage: 3.01 V
Brady Statistic AP VP Percent: 2.1 %
Brady Statistic AP VS Percent: 1 %
Brady Statistic AS VP Percent: 98 %
Brady Statistic AS VS Percent: 1 %
Brady Statistic RA Percent Paced: 1.9 %
Brady Statistic RV Percent Paced: 99 %
Date Time Interrogation Session: 20230927190018
Implantable Lead Implant Date: 20220928
Implantable Lead Implant Date: 20220928
Implantable Lead Location: 753859
Implantable Lead Location: 753860
Implantable Pulse Generator Implant Date: 20220928
Lead Channel Impedance Value: 440 Ohm
Lead Channel Impedance Value: 530 Ohm
Lead Channel Pacing Threshold Amplitude: 0.75 V
Lead Channel Pacing Threshold Amplitude: 0.875 V
Lead Channel Pacing Threshold Pulse Width: 0.4 ms
Lead Channel Pacing Threshold Pulse Width: 0.4 ms
Lead Channel Sensing Intrinsic Amplitude: 1.7 mV
Lead Channel Sensing Intrinsic Amplitude: 12 mV
Lead Channel Setting Pacing Amplitude: 1.125
Lead Channel Setting Pacing Amplitude: 1.75 V
Lead Channel Setting Pacing Pulse Width: 0.4 ms
Lead Channel Setting Sensing Sensitivity: 4 mV
Pulse Gen Model: 2272
Pulse Gen Serial Number: 3942268

## 2021-10-17 ENCOUNTER — Encounter: Payer: Self-pay | Admitting: Cardiology

## 2021-10-17 NOTE — Progress Notes (Unsigned)
Electrophysiology Office Follow up Visit Note:    Date:  10/18/2021   ID:  Amy Calhoun, DOB [birth year], MRN [id number]  PCP:  Alvester Chou, NP  [facility] Cardiologist:  Nelva Bush, MD  [facility] Electrophysiologist:  Vickie Epley, MD    Interval History:    Amy Calhoun is a 88 y.o. female who presents for a follow up visit. They were last seen in clinic January 18, 2021.  She has a history of complete heart block post permanent pacemaker implant October 12, 2020.  She is on outpatient hospice.  She is with family today in clinic.  She is enrolled in hospice care.  They are coming out to her house frequently to assist with care.  She gets out of the house with family assistance on a routine basis.  After today's appointment she is going out to the grocery store.  No problems with the pacemaker.  She will intermittently experience chest pain which she treats with as needed nitroglycerin and oxygen.    Past Medical History:  Diagnosis Date   Allergy    Ambulates with cane    Anemia    At high risk for falls    Cataract    removed both eyes   Chronic kidney disease 08/07/2016   Chronic renal failure, stage III   Coronary artery disease    Severe, diffuse RCA disease by cath (09/2020).  NSTEMI (12/2020) - medical therapy recommended   Depression    Esophageal dysphagia    Gastric ulcer    GERD (gastroesophageal reflux disease)    Hyperlipidemia    Hypertension    Hypothyroidism    IDDM (insulin dependent diabetes mellitus)    Ischemic colitis ([place])    Neuromuscular disorder (HCC)    neuropathy   Osteoarthritis    Ulcer of esophagus     Past Surgical History:  Procedure Laterality Date   ABDOMINAL HYSTERECTOMY     CATARACT EXTRACTION, BILATERAL     CHOLECYSTECTOMY     COLONOSCOPY  2010   ESOPHAGEAL DILATION  08/07/2016   usually a couple times a year, last time 07/30/16   ESOPHAGOGASTRODUODENOSCOPY (EGD) WITH PROPOFOL N/A 05/23/2021    Procedure: ESOPHAGOGASTRODUODENOSCOPY (EGD) WITH PROPOFOL;  Surgeon: Jonathon Bellows, MD;  Location: [place];  Service: Gastroenterology;  Laterality: N/A;   GALLBLADDER SURGERY     LEFT HEART CATH AND CORONARY ANGIOGRAPHY N/A 10/11/2020   Procedure: LEFT HEART CATH AND CORONARY ANGIOGRAPHY;  Surgeon: Nelva Bush, MD;  Location: [place] [facility];  Service: Cardiovascular;  Laterality: N/A;   LUMBAR LAMINECTOMY/DECOMPRESSION MICRODISCECTOMY N/A 08/10/2016   Procedure: BILATERAL HEMILAMINECTOMY LUMBAR THREE-FOUR,LUMBAR FOUR-FIVE,LEFT LUMBAR FIVE-SACRAL ONE HEMILAMINECTOMY AND DECOMPRESSION;  Surgeon: Ashok Pall, MD;  Location: [place];  Service: Neurosurgery;  Laterality: N/A;   PACEMAKER IMPLANT N/A 10/12/2020   Procedure: PACEMAKER IMPLANT;  Surgeon: Vickie Epley, MD;  Location: [place] [facility];  Service: Cardiovascular;  Laterality: N/A;   TEMPORARY PACEMAKER N/A 10/11/2020   Procedure: TEMPORARY PACEMAKER;  Surgeon: Nelva Bush, MD;  Location: [place] [facility];  Service: Cardiovascular;  Laterality: N/A;   THYROIDECTOMY     UPPER GASTROINTESTINAL ENDOSCOPY      Current Medications: Current Meds  Medication Sig   albuterol (PROVENTIL) (2.5 MG/3ML) 0.083% nebulizer solution Take 3 mLs (2.5 mg total) by nebulization every 6 (six) hours as needed for wheezing or shortness of breath.   albuterol (VENTOLIN HFA) 108 (90 Base) MCG/ACT inhaler Inhale 2 puffs into the lungs  every 6 (six) hours as needed for wheezing or shortness of breath.   amitriptyline (ELAVIL) 100 MG tablet Take 1 tablet (100 mg total) by mouth at bedtime.   ascorbic acid (VITAMIN C) 500 MG tablet Take 500 mg by mouth daily.   aspirin EC 81 MG EC tablet Take 1 tablet (81 mg total) by mouth daily. Swallow whole.   baclofen 5 MG TABS Take 2.5-5 mg by mouth 2 (two) times daily as needed (spasms).   budesonide-formoterol (SYMBICORT) 80-4.5 MCG/ACT inhaler Inhale 2 puffs into the lungs 2 (two) times daily.    Cholecalciferol (VITAMIN D) 2000 units tablet Take 2,000 Units by mouth daily.    Continuous Blood Gluc Sensor (FREESTYLE LIBRE 14 DAY SENSOR) MISC 1 Package by Does not apply route every 14 (fourteen) days.   Cyanocobalamin (DODEX IJ) Inject as directed every 30 (thirty) days.   ezetimibe (ZETIA) 10 MG tablet Take 1 tablet (10 mg total) by mouth daily.   ferrous sulfate 324 MG TBEC Take 324 mg by mouth daily with breakfast.   furosemide (LASIX) 20 MG tablet Take 1 tablet (20 mg total) by mouth daily.   gabapentin (NEURONTIN) 100 MG capsule Take 2 capsules (200 mg total) by mouth 3 (three) times daily.   glucagon 1 MG injection Inject 1 mg into the muscle once as needed for up to 1 dose. Hypoglycemia If she is not tolerating PO.   guaiFENesin (ROBITUSSIN) 100 MG/5ML liquid Take 10 mLs by mouth every 4 (four) hours as needed for cough or to loosen phlegm.   HYDROcodone-acetaminophen (NORCO) 10-325 MG tablet Take 1 tablet by mouth every 6 (six) hours as needed.   insulin glargine (LANTUS SOLOSTAR) 100 UNIT/ML Solostar Pen Inject 16 Units into the skin at bedtime.   insulin lispro (HUMALOG) 100 UNIT/ML injection Inject 10 Units into the skin 3 (three) times daily before meals.   Insulin Pen Needle 32G X 4 MM MISC 1 Device by Does not apply route in the morning, at noon, in the evening, and at bedtime.   isosorbide mononitrate (IMDUR) 30 MG 24 hr tablet Take 1 tablet (30 mg total) by mouth daily.   levofloxacin (LEVAQUIN) 750 MG tablet Take 750 mg by mouth daily.   levothyroxine (SYNTHROID) 175 MCG tablet Take 1 tablet (175 mcg total) by mouth daily.   losartan (COZAAR) 25 MG tablet TAKE 1 TABLET BY MOUTH DAILY   Misc. Devices (BARIATRIC ROLLATOR) MISC Use as needed   Multiple Vitamins-Minerals (PRESERVISION AREDS PO) Take 1 tablet by mouth daily.   nitroGLYCERIN (NITROSTAT) 0.4 MG SL tablet Place 1 tablet (0.4 mg total) under the tongue every 5 (five) minutes x 3 doses as needed for chest pain.    omeprazole (PRILOSEC) 40 MG capsule TAKE 1 CAPSULE TWICE A DAY   prochlorperazine (COMPAZINE) 10 MG tablet Take 10 mg by mouth every 4 (four) hours as needed.   THICK-IT POWD See admin instructions.   WIXELA INHUB 100-50 MCG/ACT AEPB Inhale 1 puff into the lungs 2 (two) times daily.     Allergies:   Pravachol [pravastatin]   Social History   Socioeconomic History   Marital status: Widowed    Spouse name: Not on file   Number of children: Not on file   Years of education: Not on file   Highest education level: Not on file  Occupational History   Occupation: retired  Tobacco Use   Smoking status: Never   Smokeless tobacco: Never  Vaping Use   Vaping Use:  Never used  Substance and Sexual Activity   Alcohol use: No   Drug use: No   Sexual activity: Not Currently    Partners: Male  Other Topics Concern   Not on file  Social History Narrative   [facility] senior complex, independent living.    Provide her with cleaning, one meal per day.    Social Determinants of Health   Financial Resource Strain: Low Risk  (12/22/2020)   Overall Financial Resource Strain (CARDIA)    Difficulty of Paying Living Expenses: Not hard at all  Food Insecurity: No Food Insecurity (08/18/2020)   Hunger Vital Sign    Worried About Running Out of Food in the Last Year: Never true    [place] in the Last Year: Never true  Transportation Needs: No Transportation Needs (08/18/2020)   PRAPARE - Hydrologist (Medical): No    Lack of Transportation (Non-Medical): No  Physical Activity: Sufficiently Active (08/18/2020)   Exercise Vital Sign    Days of Exercise per Week: 3 days    Minutes of Exercise per Session: 50 min  Stress: No Stress Concern Present (08/18/2020)   [place]    Feeling of Stress : Not at all  Social Connections: Unknown (08/18/2020)   Social Connection and Isolation Panel [NHANES]     Frequency of Communication with Friends and Family: Not on file    Frequency of Social Gatherings with Friends and Family: More than three times a week    Attends Religious Services: Not on Advertising copywriter or Organizations: Not on file    Attends Archivist Meetings: Not on file    Marital Status: Not on file     Family History: The patient's family history includes Arthritis in her maternal grandfather, maternal grandmother, and mother; Diabetes in her mother; Heart disease in her father; Hyperlipidemia in her mother; Mental illness in her mother and sister. There is no history of Colon cancer, Stomach cancer, Esophageal cancer, Rectal cancer, or Colon polyps.  ROS:   Please see the history of present illness.    All other systems reviewed and are negative.  EKGs/Labs/Other Studies Reviewed:    The following studies were reviewed today:  October 18, 2021 in clinic device interrogation personally reviewed Battery longevity 9.2 years Lead parameters stable Underlying rhythm 36 bpm Ventricular pacing 99% Atrial pacing 1.9%   Recent Labs: 12/27/2020: B Natriuretic Peptide 1,034.4 05/23/2021: ALT 17 05/29/2021: BUN 35; Creatinine, Ser 1.22; Hemoglobin 10.0; Magnesium 2.5; Platelets 216; Potassium 3.4; Sodium 148 07/26/2021: TSH 5.18  Recent Lipid Panel    Component Value Date/Time   CHOL 134 02/13/2021 0911   TRIG 82 02/13/2021 0911   HDL 47 02/13/2021 0911   CHOLHDL 2.9 02/13/2021 0911   CHOLHDL 2 10/14/2019 1425   VLDL 17.8 10/14/2019 1425   LDLCALC 71 02/13/2021 0911    Physical Exam:    VS:  BP (!) 112/56   Pulse 75   Ht 5\' 4"  (1.626 m)   Wt 132 lb (59.9 kg)   SpO2 95%   BMI 22.66 kg/m     Wt Readings from Last 3 Encounters:  10/18/21 132 lb (59.9 kg)  08/17/21 132 lb (59.9 kg)  07/26/21 133 lb (60.3 kg)     GEN:  Well nourished, well developed in no acute distress HEENT: Normal NECK: No JVD; No carotid bruits LYMPHATICS: No  lymphadenopathy  CARDIAC: RRR, no murmurs, rubs, gallops.  Pacemaker pocket well-healed RESPIRATORY:  Clear to auscultation without rales, wheezing or rhonchi  ABDOMEN: Soft, non-tender, non-distended MUSCULOSKELETAL:  No edema; No deformity  SKIN: Warm and dry NEUROLOGIC:  Alert and oriented x 3 PSYCHIATRIC:  Normal affect        ASSESSMENT:    1. Heart block AV complete (HCC)   2. Cardiac pacemaker in situ    PLAN:    In order of problems listed above:  #Complete heart block #Permanent pacemaker in situ Device functioning appropriately.  Continue remote monitoring.  Follow-up 1 year or sooner as needed with APP.     Medication Adjustments/Labs and Tests Ordered: Current medicines are reviewed at length with the patient today.  Concerns regarding medicines are outlined above.  No orders of the defined types were placed in this encounter.  No orders of the defined types were placed in this encounter.    Signed, Lars Mage, MD, [facility], [facility] 10/18/2021 1:25 PM    Electrophysiology [facility]

## 2021-10-18 ENCOUNTER — Ambulatory Visit: Payer: Medicare Other | Attending: Cardiology | Admitting: Cardiology

## 2021-10-18 VITALS — BP 112/56 | HR 75 | Ht 64.0 in | Wt 132.0 lb

## 2021-10-18 DIAGNOSIS — I442 Atrioventricular block, complete: Secondary | ICD-10-CM | POA: Diagnosis not present

## 2021-10-18 DIAGNOSIS — Z950 Presence of cardiac pacemaker: Secondary | ICD-10-CM | POA: Diagnosis not present

## 2021-10-18 NOTE — Patient Instructions (Signed)
Medication Instructions:  none *If you need a refill on your cardiac medications before your next appointment, please call your pharmacy*   Lab Work: none If you have labs (blood work) drawn today and your tests are completely normal, you will receive your results only by: MyChart Message (if you have MyChart) OR A paper copy in the mail If you have any lab test that is abnormal or we need to change your treatment, we will call you to review the results.   Testing/Procedures: none   Follow-Up: At [facility], you and your health needs are our priority.  As part of our continuing mission to provide you with exceptional heart care, we have created designated Provider Care Teams.  These Care Teams include your primary Cardiologist (physician) and Advanced Practice Providers (APPs -  Physician Assistants and Nurse Practitioners) who all work together to provide you with the care you need, when you need it.  We recommend signing up for the patient portal called "MyChart".  Sign up information is provided on this After Visit Summary.  MyChart is used to connect with patients for Virtual Visits (Telemedicine).  Patients are able to view lab/test results, encounter notes, upcoming appointments, etc.  Non-urgent messages can be sent to your provider as well.   To learn more about what you can do with MyChart, go to [email or web address].    Your next appointment:   1 year(s)  The format for your next appointment:   In Person  Provider:   Cameron Lambert, MD    Other Instructions None   Important Information About Sugar       

## 2021-10-19 NOTE — Progress Notes (Signed)
Remote pacemaker transmission.   

## 2021-10-25 ENCOUNTER — Other Ambulatory Visit: Payer: Self-pay | Admitting: Internal Medicine

## 2021-11-23 ENCOUNTER — Encounter: Payer: Self-pay | Admitting: Internal Medicine

## 2022-01-10 ENCOUNTER — Ambulatory Visit (INDEPENDENT_AMBULATORY_CARE_PROVIDER_SITE_OTHER)

## 2022-01-10 DIAGNOSIS — I442 Atrioventricular block, complete: Secondary | ICD-10-CM

## 2022-01-11 ENCOUNTER — Telehealth: Payer: Self-pay | Admitting: Physician Assistant

## 2022-01-11 LAB — CUP PACEART REMOTE DEVICE CHECK
Battery Remaining Longevity: 110 mo
Battery Remaining Percentage: 88 %
Battery Voltage: 3.01 V
Brady Statistic AP VP Percent: 1 %
Brady Statistic AP VS Percent: 1 %
Brady Statistic AS VP Percent: 99 %
Brady Statistic AS VS Percent: 1 %
Brady Statistic RA Percent Paced: 1 %
Brady Statistic RV Percent Paced: 99 %
Date Time Interrogation Session: 20231227020011
Implantable Lead Connection Status: 753985
Implantable Lead Connection Status: 753985
Implantable Lead Implant Date: 20220928
Implantable Lead Implant Date: 20220928
Implantable Lead Location: 753859
Implantable Lead Location: 753860
Implantable Pulse Generator Implant Date: 20220928
Lead Channel Impedance Value: 490 Ohm
Lead Channel Impedance Value: 530 Ohm
Lead Channel Pacing Threshold Amplitude: 0.75 V
Lead Channel Pacing Threshold Amplitude: 0.875 V
Lead Channel Pacing Threshold Pulse Width: 0.4 ms
Lead Channel Pacing Threshold Pulse Width: 0.4 ms
Lead Channel Sensing Intrinsic Amplitude: 2 mV
Lead Channel Sensing Intrinsic Amplitude: 6.4 mV
Lead Channel Setting Pacing Amplitude: 1.125
Lead Channel Setting Pacing Amplitude: 1.75 V
Lead Channel Setting Pacing Pulse Width: 0.4 ms
Lead Channel Setting Sensing Sensitivity: 4 mV
Pulse Gen Model: 2272
Pulse Gen Serial Number: 3942268

## 2022-01-11 NOTE — Telephone Encounter (Signed)
Virtual is fine

## 2022-01-11 NOTE — Telephone Encounter (Signed)
Patient or patient's daughter will need to call and ask insurance if they are still approving virtual appointments. I know several are no longer covering them we were told in a huddle.

## 2022-01-11 NOTE — Telephone Encounter (Signed)
They will actually be coming in as self pay.

## 2022-01-11 NOTE — Telephone Encounter (Signed)
Patients daughter [name] called t ask if the upcoming follow up can be done virtually. Can you help with this?

## 2022-01-18 ENCOUNTER — Telehealth: Payer: Self-pay | Admitting: Dietician

## 2022-01-18 NOTE — Telephone Encounter (Signed)
Reason for visit: Advice Only    RD asked to consult on Amy Calhoun by [employer]   Dietitian spoke with Amy Calhoun pts daughter via telephone  Goal per daughter is to maintain nutritional status.  Per SLP in hospital: minced foods (Dysphagia II) with nectar thickened liquids  SIT UPRIGHT DURING MEALS    Last hospitalized 05/2021 with aspiration pneumonia. Patient is on Hospice.  Patient's daughter Amy Calhoun and granddaughter Amy Calhoun care for patient. Patient is fed when fully alert, sitting upright with dentures in place.  She uses a straw at times. She is given ice cream, Glucerna, other protein shakes and milk which are not nectar this.   Patient coughs with meals and this is worse since getting the flu. Does not tolerate meat as this gets stuck in her esophagus.  Does tolerate canned corned beef and is given beans, eggs, yogurt, and protein shakes to meet her protein needs.  Weight  138 lbs 01/10/22 increased from 133 lbs 10/2021.  Unsure if weight gain is due to body mass or fluid.  Instructed that anything that melts at room temperature is not nector thick.  Magic cup would be an appropriate alternative. Discussed proper thickening using the products that she has and the difference between the thickening types. Discussed proper texture. Discussed products to increase calories. Emma asked what to do in the event that [name] is sick and unable to tolerate eating by mouth.  Discussed priority is hydration with thickened liquids, if she is not tolerating these then family needs to make decisions regarding any further care.  Discussed PEG tube but patient on hospice, daughter does not think she would tolerate or want this and TNA is not appropriate.  Discussed the difficulty of these decisions. Discussed to keep the diet as liberal is needed to maintain nutrition status, weight, and avoid low blood glucose readings.  Recommendations: Nectar thick liquids and very soft/minced/pureed foods. Avoid ice  cream as this is not nectar thick. Thicken all beverages including milk, water, shakes to nectar thick.   Do not use a straw. Sit up right. Small bites and sips. Chin tuck as needed.   Emma to call for further questions. Will mail IDDSI Level 5 MInced & Moist ([place]) Nutrition Therapy from [facility].   Antonieta Iba, RD, LDN, CDCES

## 2022-01-26 ENCOUNTER — Ambulatory Visit: Payer: Medicare Other | Admitting: Internal Medicine

## 2022-01-29 NOTE — Progress Notes (Unsigned)
Name: Amy Calhoun  Age/ Sex: 89 y.o., female   MRN/ DOB: [id number], December 13, 1928     PCP: Marletta Lor, NP   Reason for Endocrinology Evaluation: Type 2 Diabetes Mellitus  Initial Endocrine Consultative Visit: 04/15/2018    PATIENT IDENTIFIER: Amy Calhoun is a 89 y.o. female with a past medical history of T2DM, Dyslipidemia, Hypothyroidism , heart block S/P pacemaker and HTN . The patient has followed with Endocrinology clinic since 04/15/2018 for consultative assistance with management of her diabetes.   Patient used to be cared for by her other daughter in [place] but daughter had passed due to a motorcycle accident and [name] brought mother to [facility] in 2017.   DIABETIC HISTORY:  Amy Calhoun was diagnosed with DM at age 86. She has been on insulin since her diagnosis.She was briefly on glipizide but this was stopped in March, 2020. Pt did not recall any other oral glycemic agents. Her hemoglobin A1c has ranged from 6.5 % in 2018, peaking at 8.9%  In 2018.  Daughter would like to explore putting mother on glycemic agents, she is concerned about having MDI regimen as mother gets older and worried about future dexterity.   On her initial visit to our clinic, she was on Humulin-N BID, Humulin-R per SS and Tradjenta.  We stopped Tradjenta, started Trulicity in March, 2020, with reduction in insulin doses.   In 09/2018 Replaced Humulin-N with Lantus   SUBJECTIVE:   During the last visit (07/26/2021): A1c 9.1%      Today (01/30/2022): Amy Calhoun is here with daughter [name] for a follow up on diabetes .  She checks her blood sugars multiple times a day through the freestyle libre. The patient has not had hypoglycemic episodes since the last clinic visit.  She has influenza A during the holidays , she required Abx and prednisone  She had a follow-up with cardiology 10/2021 Weight stable  Denies constipation or diarrhea She continues with dysphagia and choking  Continues to be under  hospice care   She has left arm aches for the past 2 months, saw a chiropractor with hand numbness   HOME ENDOCRINE  REGIMEN:  Lantus 14 units daily  Humalog 10 -12 units TID QAC Humalog 4-5 units QHS if BG > 250 mg/dL  Levothyroxine 409 mcg daily    CONTINUOUS GLUCOSE MONITORING RECORD INTERPRETATION    Dates of Recording: 1/3-1/16/2024  Sensor description:Freestyle Libre  Results statistics:   CGM use % of time 95  Average and SD 244/31.9  Time in range  25%  % Time Above 180 33  % Time above 250 42  % Time Below target 0     Glycemic patterns summary: Bg's optimal overnight and hyperglycemia noted during the day  Hypoglycemic episodes occurred N/A Overnight periods: trends down      HISTORY:  Past Medical History:  Past Medical History:  Diagnosis Date   Allergy    Ambulates with cane    Anemia    At high risk for falls    Cataract    removed both eyes   Chronic kidney disease 08/07/2016   Chronic renal failure, stage III   Coronary artery disease    Severe, diffuse RCA disease by cath (09/2020).  NSTEMI (12/2020) - medical therapy recommended   Depression    Esophageal dysphagia    Gastric ulcer    GERD (gastroesophageal reflux disease)    Hyperlipidemia    Hypertension    Hypothyroidism  IDDM (insulin dependent diabetes mellitus)    Ischemic colitis ([place])    Neuromuscular disorder ([place])    neuropathy   Osteoarthritis    Ulcer of esophagus    Past Surgical History:  Past Surgical History:  Procedure Laterality Date   ABDOMINAL HYSTERECTOMY     CATARACT EXTRACTION, BILATERAL     CHOLECYSTECTOMY     COLONOSCOPY  2010   ESOPHAGEAL DILATION  08/07/2016   usually a couple times a year, last time 07/30/16   ESOPHAGOGASTRODUODENOSCOPY (EGD) WITH PROPOFOL N/A 05/23/2021   Procedure: ESOPHAGOGASTRODUODENOSCOPY (EGD) WITH PROPOFOL;  Surgeon: Jonathon Bellows, MD;  Location: [facility] ENDOSCOPY;  Service: Gastroenterology;  Laterality: N/A;   GALLBLADDER  SURGERY     LEFT HEART CATH AND CORONARY ANGIOGRAPHY N/A 10/11/2020   Procedure: LEFT HEART CATH AND CORONARY ANGIOGRAPHY;  Surgeon: Nelva Bush, MD;  Location: [place] [facility];  Service: Cardiovascular;  Laterality: N/A;   LUMBAR LAMINECTOMY/DECOMPRESSION MICRODISCECTOMY N/A 08/10/2016   Procedure: BILATERAL HEMILAMINECTOMY LUMBAR THREE-FOUR,LUMBAR FOUR-FIVE,LEFT LUMBAR FIVE-SACRAL ONE HEMILAMINECTOMY AND DECOMPRESSION;  Surgeon: Ashok Pall, MD;  Location: [place];  Service: Neurosurgery;  Laterality: N/A;   PACEMAKER IMPLANT N/A 10/12/2020   Procedure: PACEMAKER IMPLANT;  Surgeon: Vickie Epley, MD;  Location: [place] [facility];  Service: Cardiovascular;  Laterality: N/A;   TEMPORARY PACEMAKER N/A 10/11/2020   Procedure: TEMPORARY PACEMAKER;  Surgeon: Nelva Bush, MD;  Location: [place] [facility];  Service: Cardiovascular;  Laterality: N/A;   THYROIDECTOMY     UPPER GASTROINTESTINAL ENDOSCOPY     Social History:  reports that she has never smoked. She has never used smokeless tobacco. She reports that she does not drink alcohol and does not use drugs. Family History:  Family History  Problem Relation Age of Onset   Diabetes Mother    Arthritis Mother    Hyperlipidemia Mother    Mental illness Mother    Heart disease Father    Mental illness Sister    Arthritis Maternal Grandmother    Arthritis Maternal Grandfather    Colon cancer Neg Hx    Stomach cancer Neg Hx    Esophageal cancer Neg Hx    Rectal cancer Neg Hx    Colon polyps Neg Hx      HOME MEDICATIONS: Allergies as of 01/30/2022       Reactions   Pravachol [pravastatin] Other (See Comments)   States she refused due to side effects        Medication List        Accurate as of January 30, 2022  8:56 AM. If you have any questions, ask your nurse or doctor.          STOP taking these medications    budesonide-formoterol 80-4.5 MCG/ACT inhaler Commonly known as: SYMBICORT Stopped by:  Dorita Sciara, MD   HYDROcodone-acetaminophen 10-325 MG tablet Commonly known as: NORCO Stopped by: Dorita Sciara, MD   levofloxacin 750 MG tablet Commonly known as: LEVAQUIN Stopped by: Dorita Sciara, MD       TAKE these medications    albuterol (2.5 MG/3ML) 0.083% nebulizer solution Commonly known as: PROVENTIL Take 3 mLs (2.5 mg total) by nebulization every 6 (six) hours as needed for wheezing or shortness of breath.   albuterol 108 (90 Base) MCG/ACT inhaler Commonly known as: VENTOLIN HFA Inhale 2 puffs into the lungs every 6 (six) hours as needed for wheezing or shortness of breath.   amitriptyline 100 MG tablet Commonly known as: ELAVIL Take 1 tablet (  100 mg total) by mouth at bedtime.   ascorbic acid 500 MG tablet Commonly known as: VITAMIN C Take 500 mg by mouth daily.   aspirin EC 81 MG tablet Take 1 tablet (81 mg total) by mouth daily. Swallow whole.   Baclofen 5 MG Tabs Take 2.5-5 mg by mouth 2 (two) times daily as needed (spasms).   Bariatric Rollator Misc Use as needed   DODEX IJ Inject as directed every 30 (thirty) days.   ezetimibe 10 MG tablet Commonly known as: ZETIA Take 1 tablet (10 mg total) by mouth daily.   ferrous sulfate 324 MG Tbec Take 324 mg by mouth daily with breakfast.   FreeStyle Libre 14 Day Sensor Misc 1 Package by Does not apply route every 14 (fourteen) days.   furosemide 20 MG tablet Commonly known as: LASIX Take 1 tablet (20 mg total) by mouth daily.   gabapentin 100 MG capsule Commonly known as: NEURONTIN Take 2 capsules (200 mg total) by mouth 3 (three) times daily.   glucagon 1 MG injection Inject 1 mg into the muscle once as needed for up to 1 dose. Hypoglycemia If she is not tolerating PO.   guaiFENesin 100 MG/5ML liquid Commonly known as: ROBITUSSIN Take 10 mLs by mouth every 4 (four) hours as needed for cough or to loosen phlegm.   insulin lispro 100 UNIT/ML injection Commonly known  as: HUMALOG Inject 10 Units into the skin 3 (three) times daily before meals.   Insulin Pen Needle 32G X 4 MM Misc 1 Device by Does not apply route in the morning, at noon, in the evening, and at bedtime.   isosorbide mononitrate 30 MG 24 hr tablet Commonly known as: IMDUR Take 1 tablet (30 mg total) by mouth daily.   Lantus SoloStar 100 UNIT/ML Solostar Pen Generic drug: insulin glargine Inject 14 Units into the skin at bedtime.   levothyroxine 175 MCG tablet Commonly known as: SYNTHROID Take 1 tablet (175 mcg total) by mouth daily.   losartan 25 MG tablet Commonly known as: COZAAR TAKE 1 TABLET BY MOUTH DAILY   nitroGLYCERIN 0.4 MG SL tablet Commonly known as: NITROSTAT Place 1 tablet (0.4 mg total) under the tongue every 5 (five) minutes x 3 doses as needed for chest pain.   omeprazole 40 MG capsule Commonly known as: PRILOSEC TAKE 1 CAPSULE TWICE A DAY   PRESERVISION AREDS PO Take 1 tablet by mouth daily.   prochlorperazine 10 MG tablet Commonly known as: COMPAZINE Take 10 mg by mouth every 4 (four) hours as needed.   Thick-It Powd Generic drug: STARCH-MALTO DEXTRIN See admin instructions.   Vitamin D 50 MCG (2000 UT) tablet Take 2,000 Units by mouth daily.   Wixela Inhub 100-50 MCG/ACT Aepb Generic drug: fluticasone-salmeterol Inhale 1 puff into the lungs 2 (two) times daily.         OBJECTIVE:   Vital Signs: BP 110/68 (BP Location: Left Arm, Patient Position: Sitting, Cuff Size: Small)   Pulse 70   Wt 135 lb 12.8 oz (61.6 kg)   BMI 23.31 kg/m   Wt Readings from Last 3 Encounters:  01/30/22 135 lb 12.8 oz (61.6 kg)  10/18/21 132 lb (59.9 kg)  08/17/21 132 lb (59.9 kg)     Exam: General: Pt appears well and is in NAD in a wheelchair   Lungs: Clear with good BS bilat   Heart: RRR   Extremities: No pretibial edema.        DATA REVIEWED:  Lab Results  Component Value Date   HGBA1C 8.6 (A) 07/26/2021   HGBA1C 9.1 (A) 12/12/2020    HGBA1C 9.4 (A) 09/21/2020    Latest Reference Range & Units 01/30/22 09:15  TSH 0.35 - 5.50 uIU/mL 1.52     Latest Reference Range & Units 05/29/21 04:51  Sodium 135 - 145 mmol/L 150 (H)  Potassium 3.5 - 5.1 mmol/L 3.4 (L)  Chloride 98 - 111 mmol/L 115 (H)  CO2 22 - 32 mmol/L 25  Glucose 70 - 99 mg/dL 68 (L)  BUN 8 - 23 mg/dL 35 (H)  Creatinine 1.61 - 1.00 mg/dL 0.96 (H)  Calcium 8.9 - 10.3 mg/dL 8.1 (L)  Anion gap 5 - 15  10  Magnesium 1.7 - 2.4 mg/dL 2.5 (H)  GFR, Estimated >60 mL/min 42 (L)    ASSESSMENT / PLAN / RECOMMENDATIONS:   1) Insulin-Dependent, Poorly controlled, With CKD III and neuropathic complications - Most recent A1c of 9.2 %. Goal A1c < 8.0%.    - In review of her CGM download she has been noted with hyperglycemia during the day  -Due to recurrent dysphagia, she has been advised to give prandial insulin immediately after the meal to make sure patient has swallowed all food, which limits optimization of glucose control -Will increase Humalog with each meal as the current dose is not sufficient -Patient with insulin sensitivity, high risk for hypoglycemia, another limiting factor in optimizing BG's  MEDICATIONS:  Continue  Lantus 14 Units QHS Increase Humalog 12 units TID Continue to give Humalog 4-5 units at bedtime if BG >250 mg/dL Correction Factor: Humalog (BG-  EDUCATION / INSTRUCTIONS: BG monitoring instructions: Patient is instructed to check her blood sugars 4 times a day, before meals and bedtime. Call [place] [facility] clinic if: BG persistently < 70  I reviewed the Rule of 15 for the treatment of hypoglycemia in detail with the patient. Literature supplied.   2. Hypothyroidism:   -TSH is normal  Medication  Continue levothyroxine 175 mcg daily     F/U in 6 months    Signed electronically by: Lyndle Herrlich, MD  [facility] Endocrinology  [facility] [address] [place] [place], [place]  [address] Phone: [phone] FAX: [phone]   CC: Marletta Lor, NP [address] [place] [address] Phone: [phone]  Fax: [phone]  Return to Endocrinology clinic as below: Future Appointments  Date Time Provider Department Center  02/06/2022  2:30 PM Unk Lightning, [place] [facility]  04/11/2022  7:00 AM [facility] DEVICE REMOTES [id number]

## 2022-01-30 ENCOUNTER — Ambulatory Visit (INDEPENDENT_AMBULATORY_CARE_PROVIDER_SITE_OTHER): Payer: Medicare Other | Admitting: Internal Medicine

## 2022-01-30 ENCOUNTER — Encounter: Payer: Self-pay | Admitting: Internal Medicine

## 2022-01-30 VITALS — BP 110/68 | HR 70 | Wt 135.8 lb

## 2022-01-30 DIAGNOSIS — E039 Hypothyroidism, unspecified: Secondary | ICD-10-CM | POA: Diagnosis not present

## 2022-01-30 DIAGNOSIS — E1165 Type 2 diabetes mellitus with hyperglycemia: Secondary | ICD-10-CM | POA: Diagnosis not present

## 2022-01-30 DIAGNOSIS — Z794 Long term (current) use of insulin: Secondary | ICD-10-CM

## 2022-01-30 LAB — POCT GLYCOSYLATED HEMOGLOBIN (HGB A1C): Hemoglobin A1C: 9.2 % — AB (ref 4.0–5.6)

## 2022-01-30 LAB — TSH: TSH: 1.52 u[IU]/mL (ref 0.35–5.50)

## 2022-01-30 MED ORDER — LEVOTHYROXINE SODIUM 175 MCG PO TABS: 175.0000 ug | ORAL_TABLET | Freq: Every day | ORAL | 3 refills | Status: DC

## 2022-01-30 MED ORDER — LANTUS SOLOSTAR 100 UNIT/ML ~~LOC~~ SOPN: 14.0000 [IU] | PEN_INJECTOR | Freq: Every day | SUBCUTANEOUS | 3 refills | Status: DC

## 2022-01-30 MED ORDER — INSULIN PEN NEEDLE 32G X 4 MM MISC: 1.0000 | Freq: Four times a day (QID) | 3 refills | Status: DC

## 2022-01-30 MED ORDER — INSULIN LISPRO (1 UNIT DIAL) 100 UNIT/ML (KWIKPEN): PEN_INJECTOR | SUBCUTANEOUS | 3 refills | Status: DC

## 2022-01-30 NOTE — Progress Notes (Signed)
Remote pacemaker transmission.   

## 2022-01-30 NOTE — Patient Instructions (Addendum)
-   Continue Lantus 14 Units bedtime  -Take Humalog 12 units with each meal  - Bedtime Humalog between 4-5 units if sugar over 250  - Humalog correctional insulin:  Use the scale below to help guide you Blood sugar before meal Number of units to inject  Less than 190 0 unit  191 -  250 1 units  251 -  310 2 units  311 -  370 3 units  371 -  430 4 units  431 -  490 5 units  491 -  550 6 units          HOW TO TREAT LOW BLOOD SUGARS (Blood sugar LESS THAN 70 MG/DL) Please follow the RULE OF 15 for the treatment of hypoglycemia treatment (when your (blood sugars are less than 70 mg/dL)   STEP 1: Take 15 grams of carbohydrates when your blood sugar is low, which includes:  3-4 GLUCOSE TABS  OR 3-4 OZ OF JUICE OR REGULAR SODA OR ONE TUBE OF GLUCOSE GEL    STEP 2: RECHECK blood sugar in 15 MINUTES STEP 3: If your blood sugar is still low at the 15 minute recheck --> then, go back to STEP 1 and treat AGAIN with another 15 grams of carbohydrates.

## 2022-02-01 ENCOUNTER — Encounter: Payer: Self-pay | Admitting: Internal Medicine

## 2022-02-06 ENCOUNTER — Telehealth (INDEPENDENT_AMBULATORY_CARE_PROVIDER_SITE_OTHER): Payer: Medicare Other | Admitting: Physician Assistant

## 2022-02-06 ENCOUNTER — Telehealth: Payer: Self-pay

## 2022-02-06 ENCOUNTER — Encounter: Payer: Self-pay | Admitting: Physician Assistant

## 2022-02-06 ENCOUNTER — Telehealth: Payer: Self-pay | Admitting: Physician Assistant

## 2022-02-06 DIAGNOSIS — Z515 Encounter for palliative care: Secondary | ICD-10-CM

## 2022-02-06 DIAGNOSIS — R1310 Dysphagia, unspecified: Secondary | ICD-10-CM | POA: Diagnosis not present

## 2022-02-06 DIAGNOSIS — K219 Gastro-esophageal reflux disease without esophagitis: Secondary | ICD-10-CM | POA: Diagnosis not present

## 2022-02-06 MED ORDER — FAMOTIDINE 40 MG PO TABS: 40.0000 mg | ORAL_TABLET | Freq: Two times a day (BID) | ORAL | 5 refills | Status: DC

## 2022-02-06 NOTE — Patient Instructions (Signed)
It was a pleasure to speak with you today.  I have sent in a prescription for Famotidine 40 mg twice daily, every morning and nightly for you.  #60 with 5 refills.  Also you can try crushing up 3 Altoid's prior to every meal to help with spasms.  Please call back if you have any further questions or concerns.  Sincerely, Ellouise Newer, PA-C

## 2022-02-06 NOTE — Telephone Encounter (Signed)
Inbound call from patient daughter states it is fine to have a mychart video visit.   Please advise.

## 2022-02-06 NOTE — Progress Notes (Signed)
Unable to connect via video and visit was accomplished only through telephone verbally with patient's daughter/POA.  This service was provided via telemedicine.  The patient was located at home.  The provider was located at [place].  The patient did consent to this telephone visit and is aware of possible charges through their insurance for this visit.  The other persons participating in this telemedicine service were Amy Calhoun Patients daughter and POA and their role was providing history for the whole visit..  Time spent on call: 15 min   Chief Complaint: Dysphagia  HPI:    Amy Calhoun is a 89 year old female with a past medical history as listed below including EGD, esophageal dysphagia and stricture, previously known to Dr. Ardis Hughs, who is consulted via telemedicine today via telephone only with no video available for a complaint of dysphagia.    05/29/2021 esophagram with no evidence of tracheal aspiration, severe tertiary contractions throughout the esophagus consistent with esophageal spasm, severe focal narrowing of the distal esophagus just proximal to the EG junction concerning for high-grade stricture.    05/23/2021 patient underwent EGD, I cannot see the report.  Per daughter patient was dilated.    The entire visit is conducted with the patient's daughter and POA Amy Calhoun who tells me that her dysphagia has worsened recently, she is on hospice and they will not allow her to have any procedures anymore, but she is wondering if there is anything she can do when the dysphagia worsens.  Apparently will be okay for a week or 2 and then she will go through a period of time where for 3 to 4 days she can really not eat anything.  She is currently on Pantoprazole 40 mg twice a day.  Daughter is just asking if there is anything he can do though given known stricture as well as esophageal spasms.    Denies abdominal pain, fever, chills, nausea or vomiting.  Past Medical History:  Diagnosis  Date   Allergy    Ambulates with cane    Anemia    At high risk for falls    Cataract    removed both eyes   Chronic kidney disease 08/07/2016   Chronic renal failure, stage III   Coronary artery disease    Severe, diffuse RCA disease by cath (09/2020).  NSTEMI (12/2020) - medical therapy recommended   Depression    Esophageal dysphagia    Gastric ulcer    GERD (gastroesophageal reflux disease)    Hyperlipidemia    Hypertension    Hypothyroidism    IDDM (insulin dependent diabetes mellitus)    Ischemic colitis ([place])    Neuromuscular disorder (HCC)    neuropathy   Osteoarthritis    Ulcer of esophagus     Past Surgical History:  Procedure Laterality Date   ABDOMINAL HYSTERECTOMY     CATARACT EXTRACTION, BILATERAL     CHOLECYSTECTOMY     COLONOSCOPY  2010   ESOPHAGEAL DILATION  08/07/2016   usually a couple times a year, last time 07/30/16   ESOPHAGOGASTRODUODENOSCOPY (EGD) WITH PROPOFOL N/A 05/23/2021   Procedure: ESOPHAGOGASTRODUODENOSCOPY (EGD) WITH PROPOFOL;  Surgeon: Jonathon Bellows, MD;  Location: [place];  Service: Gastroenterology;  Laterality: N/A;   GALLBLADDER SURGERY     LEFT HEART CATH AND CORONARY ANGIOGRAPHY N/A 10/11/2020   Procedure: LEFT HEART CATH AND CORONARY ANGIOGRAPHY;  Surgeon: Nelva Bush, MD;  Location: [facility];  Service: Cardiovascular;  Laterality: N/A;   LUMBAR LAMINECTOMY/DECOMPRESSION MICRODISCECTOMY N/A  08/10/2016   Procedure: BILATERAL HEMILAMINECTOMY LUMBAR THREE-FOUR,LUMBAR FOUR-FIVE,LEFT LUMBAR FIVE-SACRAL ONE HEMILAMINECTOMY AND DECOMPRESSION;  Surgeon: Coletta Memos, MD;  Location: [facility];  Service: Neurosurgery;  Laterality: N/A;   PACEMAKER IMPLANT N/A 10/12/2020   Procedure: PACEMAKER IMPLANT;  Surgeon: Lanier Prude, MD;  Location: [facility];  Service: Cardiovascular;  Laterality: N/A;   TEMPORARY PACEMAKER N/A 10/11/2020   Procedure: TEMPORARY PACEMAKER;  Surgeon: Yvonne Kendall, MD;  Location: [facility]  INVASIVE [facility];  Service: Cardiovascular;  Laterality: N/A;   THYROIDECTOMY     UPPER GASTROINTESTINAL ENDOSCOPY      Current Outpatient Medications  Medication Sig Dispense Refill   albuterol (PROVENTIL) (2.5 MG/3ML) 0.083% nebulizer solution Take 3 mLs (2.5 mg total) by nebulization every 6 (six) hours as needed for wheezing or shortness of breath. 150 mL 1   albuterol (VENTOLIN HFA) 108 (90 Base) MCG/ACT inhaler Inhale 2 puffs into the lungs every 6 (six) hours as needed for wheezing or shortness of breath. 1 each 2   amitriptyline (ELAVIL) 100 MG tablet Take 1 tablet (100 mg total) by mouth at bedtime. 90 tablet 1   ascorbic acid (VITAMIN C) 500 MG tablet Take 500 mg by mouth daily.     aspirin EC 81 MG EC tablet Take 1 tablet (81 mg total) by mouth daily. Swallow whole. 30 tablet 11   baclofen 5 MG TABS Take 2.5-5 mg by mouth 2 (two) times daily as needed (spasms). 30 tablet 0   Cholecalciferol (VITAMIN D) 2000 units tablet Take 2,000 Units by mouth daily.      Continuous Blood Gluc Sensor (FREESTYLE LIBRE 14 DAY SENSOR) MISC 1 Package by Does not apply route every 14 (fourteen) days. 6 each 3   Cyanocobalamin (DODEX IJ) Inject as directed every 30 (thirty) days.     ezetimibe (ZETIA) 10 MG tablet Take 1 tablet (10 mg total) by mouth daily. 90 tablet 0   ferrous sulfate 324 MG TBEC Take 324 mg by mouth daily with breakfast.     furosemide (LASIX) 20 MG tablet Take 1 tablet (20 mg total) by mouth daily. 90 tablet 1   gabapentin (NEURONTIN) 100 MG capsule Take 2 capsules (200 mg total) by mouth 3 (three) times daily. 540 capsule 3   glucagon 1 MG injection Inject 1 mg into the muscle once as needed for up to 1 dose. Hypoglycemia If she is not tolerating PO. 2 each 3   guaiFENesin (ROBITUSSIN) 100 MG/5ML liquid Take 10 mLs by mouth every 4 (four) hours as needed for cough or to loosen phlegm. (Patient not taking: Reported on 01/30/2022) 120 mL 0   insulin glargine (LANTUS SOLOSTAR) 100  UNIT/ML Solostar Pen Inject 14 Units into the skin at bedtime. 15 mL 3   insulin lispro (HUMALOG KWIKPEN) 100 UNIT/ML KwikPen Max daily 45 units 45 mL 3   Insulin Pen Needle 32G X 4 MM MISC 1 Device by Does not apply route in the morning, at noon, in the evening, and at bedtime. 400 each 3   isosorbide mononitrate (IMDUR) 30 MG 24 hr tablet Take 1 tablet (30 mg total) by mouth daily. 90 tablet 0   levothyroxine (SYNTHROID) 175 MCG tablet Take 1 tablet (175 mcg total) by mouth daily. 90 tablet 3   losartan (COZAAR) 25 MG tablet TAKE 1 TABLET BY MOUTH DAILY 30 tablet 3   Misc. Devices (BARIATRIC ROLLATOR) MISC Use as needed 1 each 0   Multiple Vitamins-Minerals (PRESERVISION AREDS PO) Take 1 tablet  by mouth daily.     nitroGLYCERIN (NITROSTAT) 0.4 MG SL tablet Place 1 tablet (0.4 mg total) under the tongue every 5 (five) minutes x 3 doses as needed for chest pain. 20 tablet 12   omeprazole (PRILOSEC) 40 MG capsule TAKE 1 CAPSULE TWICE A DAY 180 capsule 3   prochlorperazine (COMPAZINE) 10 MG tablet Take 10 mg by mouth every 4 (four) hours as needed.     THICK-IT POWD See admin instructions.     WIXELA INHUB 100-50 MCG/ACT AEPB Inhale 1 puff into the lungs 2 (two) times daily.     No current facility-administered medications for this visit.    Allergies as of 02/06/2022 - Review Complete 01/30/2022  Allergen Reaction Noted   Pravachol [pravastatin] Other (See Comments) 12/21/2015    Family History  Problem Relation Age of Onset   Diabetes Mother    Arthritis Mother    Hyperlipidemia Mother    Mental illness Mother    Heart disease Father    Mental illness Sister    Arthritis Maternal Grandmother    Arthritis Maternal Grandfather    Colon cancer Neg Hx    Stomach cancer Neg Hx    Esophageal cancer Neg Hx    Rectal cancer Neg Hx    Colon polyps Neg Hx     Social History   Socioeconomic History   Marital status: Widowed    Spouse name: Not on file   Number of children: Not on  file   Years of education: Not on file   Highest education level: Not on file  Occupational History   Occupation: retired  Tobacco Use   Smoking status: Never   Smokeless tobacco: Never  Vaping Use   Vaping Use: Never used  Substance and Sexual Activity   Alcohol use: No   Drug use: No   Sexual activity: Not Currently    Partners: Male  Other Topics Concern   Not on file  Social History Narrative   [facility] senior complex, independent living.    Provide her with cleaning, one meal per day.    Social Determinants of Health   Financial Resource Strain: Low Risk  (12/22/2020)   Overall Financial Resource Strain (CARDIA)    Difficulty of Paying Living Expenses: Not hard at all  Food Insecurity: No Food Insecurity (08/18/2020)   Hunger Vital Sign    Worried About Running Out of Food in the Last Year: Never true    Ran Out of Food in the Last Year: Never true  Transportation Needs: No Transportation Needs (08/18/2020)   PRAPARE - Administrator, Civil Service (Medical): No    Lack of Transportation (Non-Medical): No  Physical Activity: Sufficiently Active (08/18/2020)   Exercise Vital Sign    Days of Exercise per Week: 3 days    Minutes of Exercise per Session: 50 min  Stress: No Stress Concern Present (08/18/2020)   [employer] of Occupational Health - Occupational Stress Questionnaire    Feeling of Stress : Not at all  Social Connections: Unknown (08/18/2020)   Social Connection and Isolation Panel [NHANES]    Frequency of Communication with Friends and Family: Not on file    Frequency of Social Gatherings with Friends and Family: More than three times a week    Attends Religious Services: Not on file    Active Member of Clubs or Organizations: Not on file    Attends Banker Meetings: Not on file    Marital Status:  Not on file  Intimate Partner Violence: Not At Risk (08/18/2020)   Humiliation, Afraid, Rape, and Kick questionnaire    Fear of Current or  Ex-Partner: No    Emotionally Abused: No    Physically Abused: No    Sexually Abused: No    Review of Systems:    Constitutional: No fever or chills   Physical Exam:  Vital signs: There were no vitals taken for this visit. Telemedicine visit  RELEVANT LABS AND IMAGING: CBC    Component Value Date/Time   WBC 9.1 05/29/2021 0451   RBC 3.58 (L) 05/29/2021 0451   HGB 10.0 (L) 05/29/2021 0451   HCT 32.3 (L) 05/29/2021 0451   PLT 216 05/29/2021 0451   MCV 90.2 05/29/2021 0451   MCH 27.9 05/29/2021 0451   MCHC 31.0 05/29/2021 0451   RDW 13.6 05/29/2021 0451   LYMPHSABS 0.5 (L) 05/23/2021 1945   MONOABS 0.4 05/23/2021 1945   EOSABS 0.0 05/23/2021 1945   BASOSABS 0.0 05/23/2021 1945    CMP     Component Value Date/Time   NA 148 (H) 05/29/2021 1453   NA 141 11/17/2020 1427   K 3.4 (L) 05/29/2021 0451   CL 115 (H) 05/29/2021 0451   CO2 25 05/29/2021 0451   GLUCOSE 68 (L) 05/29/2021 0451   BUN 35 (H) 05/29/2021 0451   BUN 16 11/17/2020 1427   CREATININE 1.22 (H) 05/29/2021 0451   CALCIUM 8.1 (L) 05/29/2021 0451   PROT 8.2 (H) 05/23/2021 0950   PROT 6.5 11/17/2020 1427   ALBUMIN 4.3 05/23/2021 0950   ALBUMIN 4.2 11/17/2020 1427   AST 27 05/23/2021 0950   ALT 17 05/23/2021 0950   ALKPHOS 72 05/23/2021 0950   BILITOT 0.8 05/23/2021 0950   BILITOT 0.3 11/17/2020 1427   GFRNONAA 42 (L) 05/29/2021 0451   GFRAA 56 (L) 02/11/2019 1111    Assessment: 1.  Dysphagia: Esophageal spasm seen on upper GI study in May 2023, EGD after that with dilation of the stricture, symptoms come and go; likely esophageal spasm +/- stricture 2.  GERD  Plan: 1.  Discussed with the patient's daughter that there is not a whole lot to do about the stricture since we cannot put her to sleep, there are no other noninvasive procedures to get this dilated.  At this point would recommend that she likely decrease diet to just liquids. 2.  In regards to esophageal spasm patient could try Altoids.   Discussed with daughter that she can crush up 3 Altoids give her mom prior to every meal to see if this helps at all. 3.  Will also add Famotidine 40 mg twice a day for cover of any underlying reflux symptoms that are ongoing regardless of Pantoprazole.  Prescribed #60 with 5 refills. 4.  Patient's daughter was thankful for the call. 5.  Note sent to Dr. Havery Moros in lieu of Dr. Ardis Hughs absence.  Ellouise Newer, PA-C [facility] 02/06/2022, 2:49 PM  Cc: Alvester Chou, NP

## 2022-02-06 NOTE — Telephone Encounter (Signed)
Provider and Judie Petit, CMA were notified.

## 2022-02-06 NOTE — Telephone Encounter (Signed)
[name], mehta are scheduled for a virtual visit with your provider today.    Just as we do with appointments in the office, we must obtain your consent to participate.  Your consent will be active for this visit and any virtual visit you may have with one of our providers in the next 365 days.    If you have a MyChart account, I can also send a copy of this consent to you electronically.  All virtual visits are billed to your insurance company just like a traditional visit in the office.  As this is a virtual visit, video technology does not allow for your provider to perform a traditional examination.  This may limit your provider's ability to fully assess your condition.  If your provider identifies any concerns that need to be evaluated in person or the need to arrange testing such as labs, EKG, etc, we will make arrangements to do so.    Although advances in technology are sophisticated, we cannot ensure that it will always work on either your end or our end.  If the connection with a video visit is poor, we may have to switch to a telephone visit.  With either a video or telephone visit, we are not always able to ensure that we have a secure connection.   I need to obtain your verbal consent now.   Are you willing to proceed with your visit today?   Amy Calhoun has provided verbal consent on 02/06/2022 for a virtual visit (video or telephone).   Lavone Nian [place], [place] 02/06/2022  3:13 PM

## 2022-02-06 NOTE — Telephone Encounter (Signed)
-----  Message from Levin Erp, [place] sent at 02/06/2022  1:09 PM EST ----- Regarding: URGENT Saw on my schedule OV scheduled as virtual TODAY at 2:30 , I was never asked about this- looks like it went through heather?  Can you find out some more details, also looks like it is a phone call only?  We dont do that, have to have video capabilities so we can see her.  Unfortunately may need to be rescheduled  Thanks-JLL

## 2022-02-06 NOTE — Telephone Encounter (Signed)
Left patient's POA/daughter Amy Calhoun) a detailed vm letting her know that we needed more information regarding pt's symptoms. Also I mentioned that unfortunately we would not be able to perform a telephone call only appt and the appt would need to be changed to a MyChart video visit if they are available. I asked that [name] call back as soon as she can.

## 2022-02-07 NOTE — Progress Notes (Signed)
Agree with assessment and plan as outlined.  Without EGD no good way to help with the stricture / dysphagia.  Understand she is on hospice and they are not pursuing procedures.  Can try peppermint altoids to treat for esophageal spasm and see if that helps, although unclear how much that will help in the setting of stricture.  Will await her course.

## 2022-03-02 ENCOUNTER — Other Ambulatory Visit: Payer: Self-pay | Admitting: Internal Medicine

## 2022-03-02 DIAGNOSIS — E785 Hyperlipidemia, unspecified: Secondary | ICD-10-CM

## 2022-03-02 NOTE — Telephone Encounter (Signed)
Good Morning, could you schedule this patient a 6 month follow up visit? The patient was last seen by Dr. Saunders Revel on 08-17-21. Thank you so much.

## 2022-03-02 NOTE — Telephone Encounter (Signed)
LVM to schedule appt

## 2022-04-03 NOTE — Telephone Encounter (Signed)
No further evaluation required

## 2022-04-11 ENCOUNTER — Ambulatory Visit (INDEPENDENT_AMBULATORY_CARE_PROVIDER_SITE_OTHER)

## 2022-04-11 DIAGNOSIS — I442 Atrioventricular block, complete: Secondary | ICD-10-CM | POA: Diagnosis not present

## 2022-04-11 LAB — CUP PACEART REMOTE DEVICE CHECK
Battery Remaining Longevity: 106 mo
Battery Remaining Percentage: 86 %
Battery Voltage: 3.01 V
Brady Statistic AP VP Percent: 1 %
Brady Statistic AP VS Percent: 1 %
Brady Statistic AS VP Percent: 99 %
Brady Statistic AS VS Percent: 1 %
Brady Statistic RA Percent Paced: 1 %
Brady Statistic RV Percent Paced: 99 %
Date Time Interrogation Session: 20240327020015
Implantable Lead Connection Status: 753985
Implantable Lead Connection Status: 753985
Implantable Lead Implant Date: 20220928
Implantable Lead Implant Date: 20220928
Implantable Lead Location: 753859
Implantable Lead Location: 753860
Implantable Pulse Generator Implant Date: 20220928
Lead Channel Impedance Value: 510 Ohm
Lead Channel Impedance Value: 540 Ohm
Lead Channel Pacing Threshold Amplitude: 0.75 V
Lead Channel Pacing Threshold Amplitude: 1.125 V
Lead Channel Pacing Threshold Pulse Width: 0.4 ms
Lead Channel Pacing Threshold Pulse Width: 0.4 ms
Lead Channel Sensing Intrinsic Amplitude: 2.2 mV
Lead Channel Sensing Intrinsic Amplitude: 6.4 mV
Lead Channel Setting Pacing Amplitude: 1.375
Lead Channel Setting Pacing Amplitude: 1.75 V
Lead Channel Setting Pacing Pulse Width: 0.4 ms
Lead Channel Setting Sensing Sensitivity: 4 mV
Pulse Gen Model: 2272
Pulse Gen Serial Number: 3942268

## 2022-05-10 ENCOUNTER — Ambulatory Visit: Payer: Medicare Other | Attending: Internal Medicine | Admitting: Internal Medicine

## 2022-05-10 ENCOUNTER — Encounter: Payer: Self-pay | Admitting: Internal Medicine

## 2022-05-10 VITALS — BP 130/52 | HR 84 | Ht 65.0 in | Wt 136.1 lb

## 2022-05-10 DIAGNOSIS — E1169 Type 2 diabetes mellitus with other specified complication: Secondary | ICD-10-CM | POA: Diagnosis not present

## 2022-05-10 DIAGNOSIS — I25118 Atherosclerotic heart disease of native coronary artery with other forms of angina pectoris: Secondary | ICD-10-CM

## 2022-05-10 DIAGNOSIS — I442 Atrioventricular block, complete: Secondary | ICD-10-CM | POA: Diagnosis not present

## 2022-05-10 DIAGNOSIS — I10 Essential (primary) hypertension: Secondary | ICD-10-CM

## 2022-05-10 DIAGNOSIS — E785 Hyperlipidemia, unspecified: Secondary | ICD-10-CM

## 2022-05-10 NOTE — Patient Instructions (Signed)
Medication Instructions:  Your Physician recommend you continue on your current medication as directed.   *If you need a refill on your cardiac medications before your next appointment, please call your pharmacy*   Lab Work: No labs ordered today.  If you have labs (blood work) drawn today and your tests are completely normal, you will receive your results only by: MyChart Message (if you have MyChart) OR A paper copy in the mail If you have any lab test that is abnormal or we need to change your treatment, we will call you to review the results.   Testing/Procedures: No testing ordered today.    Follow-Up: At [facility], you and your health needs are our priority.  As part of our continuing mission to provide you with exceptional heart care, we have created designated Provider Care Teams.  These Care Teams include your primary Cardiologist (physician) and Advanced Practice Providers (APPs -  Physician Assistants and Nurse Practitioners) who all work together to provide you with the care you need, when you need it.  We recommend signing up for the patient portal called "MyChart".  Sign up information is provided on this After Visit Summary.  MyChart is used to connect with patients for Virtual Visits (Telemedicine).  Patients are able to view lab/test results, encounter notes, upcoming appointments, etc.  Non-urgent messages can be sent to your provider as well.   To learn more about what you can do with MyChart, go to [email or web address].    Your next appointment:   6 month(s)  Provider:   You may see Christopher End, MD or one of the following Advanced Practice Providers on your designated Care Team:   Christopher Berge, NP Ryan Dunn, PA-C Cadence Furth, PA-C Sheri Hammock, NP     

## 2022-05-10 NOTE — Progress Notes (Signed)
Follow-up Outpatient Visit Date: 05/10/2022  Primary Care Provider: Marletta Lor, NP [address] [place] [address]  Chief Complaint: Follow-up CAD and heart block  HPI:  Amy Calhoun is a 89 y.o. female with history of single-vessel CAD with diffuse disease of up to 90% affecting relatively small RCA, complete heart block status post dual chamber pacemaker (09/2020), hypertension, hyperlipidemia, diabetes mellitus complicated by peripheral neuropathy, orthostatic hypotension, hypothyroidism, and GERD, who presents for follow-up of coronary artery disease and heart block.  Today, Amy Calhoun is again accompanied by her daughter, who provides most of the history.  Amy Calhoun remains enrolled in hospice.  She has occasional episodes of chest pain, for which she has received NTG on a few occasions.  Her activity is fairly limited.  She has not had any significant dyspnea, palpitations, or edema.  --------------------------------------------------------------------------------------------------  Past Medical History:  Diagnosis Date   Allergy    Ambulates with cane    Anemia    At high risk for falls    Cataract    removed both eyes   Chronic kidney disease 08/07/2016   Chronic renal failure, stage III   Coronary artery disease    Severe, diffuse RCA disease by cath (09/2020).  NSTEMI (12/2020) - medical therapy recommended   Depression    Esophageal dysphagia    Gastric ulcer    GERD (gastroesophageal reflux disease)    Hyperlipidemia    Hypertension    Hypothyroidism    IDDM (insulin dependent diabetes mellitus)    Ischemic colitis (HCC)    Neuromuscular disorder (HCC)    neuropathy   Osteoarthritis    Ulcer of esophagus    Past Surgical History:  Procedure Laterality Date   ABDOMINAL HYSTERECTOMY     CATARACT EXTRACTION, BILATERAL     CHOLECYSTECTOMY     COLONOSCOPY  2010   ESOPHAGEAL DILATION  08/07/2016   usually a couple times a year, last time 07/30/16    ESOPHAGOGASTRODUODENOSCOPY (EGD) WITH PROPOFOL N/A 05/23/2021   Procedure: ESOPHAGOGASTRODUODENOSCOPY (EGD) WITH PROPOFOL;  Surgeon: [name], MD;  Location: [facility] ENDOSCOPY;  Service: Gastroenterology;  Laterality: N/A;   GALLBLADDER SURGERY     LEFT HEART CATH AND CORONARY ANGIOGRAPHY N/A 10/11/2020   Procedure: LEFT HEART CATH AND CORONARY ANGIOGRAPHY;  Surgeon: Yvonne Kendall, MD;  Location: [facility] INVASIVE CV LAB;  Service: Cardiovascular;  Laterality: N/A;   LUMBAR LAMINECTOMY/DECOMPRESSION MICRODISCECTOMY N/A 08/10/2016   Procedure: BILATERAL HEMILAMINECTOMY LUMBAR THREE-FOUR,LUMBAR FOUR-FIVE,LEFT LUMBAR FIVE-SACRAL ONE HEMILAMINECTOMY AND DECOMPRESSION;  Surgeon: Coletta Memos, MD;  Location: [facility] OR;  Service: Neurosurgery;  Laterality: N/A;   PACEMAKER IMPLANT N/A 10/12/2020   Procedure: PACEMAKER IMPLANT;  Surgeon: Lanier Prude, MD;  Location: [facility] INVASIVE CV LAB;  Service: Cardiovascular;  Laterality: N/A;   TEMPORARY PACEMAKER N/A 10/11/2020   Procedure: TEMPORARY PACEMAKER;  Surgeon: Yvonne Kendall, MD;  Location: [facility] INVASIVE CV LAB;  Service: Cardiovascular;  Laterality: N/A;   THYROIDECTOMY     UPPER GASTROINTESTINAL ENDOSCOPY      Current Meds  Medication Sig   albuterol (PROVENTIL) (2.5 MG/3ML) 0.083% nebulizer solution Take 3 mLs (2.5 mg total) by nebulization every 6 (six) hours as needed for wheezing or shortness of breath.   albuterol (VENTOLIN HFA) 108 (90 Base) MCG/ACT inhaler Inhale 2 puffs into the lungs every 6 (six) hours as needed for wheezing or shortness of breath.   amitriptyline (ELAVIL) 100 MG tablet Take 1 tablet (100 mg total) by mouth at bedtime.   ascorbic acid (VITAMIN  C) 500 MG tablet Take 500 mg by mouth daily.   aspirin EC 81 MG EC tablet Take 1 tablet (81 mg total) by mouth daily. Swallow whole.   Cholecalciferol (VITAMIN D) 2000 units tablet Take 2,000 Units by mouth daily.    Continuous Blood Gluc Sensor (FREESTYLE LIBRE 14 DAY SENSOR) MISC 1  Package by Does not apply route every 14 (fourteen) days.   Cyanocobalamin (DODEX IJ) Inject as directed every 30 (thirty) days.   ezetimibe (ZETIA) 10 MG tablet TAKE ONE TABLET BY MOUTH DAILY   famotidine (PEPCID) 40 MG tablet Take 1 tablet (40 mg total) by mouth 2 (two) times daily. Take one tablet upon waking in the morning and the other before going to bed.   ferrous sulfate 324 MG TBEC Take 324 mg by mouth daily with breakfast.   furosemide (LASIX) 20 MG tablet Take 1 tablet (20 mg total) by mouth daily.   gabapentin (NEURONTIN) 100 MG capsule Take 2 capsules (200 mg total) by mouth 3 (three) times daily.   glucagon 1 MG injection Inject 1 mg into the muscle once as needed for up to 1 dose. Hypoglycemia If she is not tolerating PO.   insulin glargine (LANTUS SOLOSTAR) 100 UNIT/ML Solostar Pen Inject 14 Units into the skin at bedtime.   insulin lispro (HUMALOG KWIKPEN) 100 UNIT/ML KwikPen Max daily 45 units (Patient taking differently: 12 Units in the morning, at noon, and at bedtime. Sliding scale prn)   Insulin Pen Needle 32G X 4 MM MISC 1 Device by Does not apply route in the morning, at noon, in the evening, and at bedtime.   isosorbide mononitrate (IMDUR) 30 MG 24 hr tablet Take 1 tablet (30 mg total) by mouth daily.   levothyroxine (SYNTHROID) 175 MCG tablet Take 1 tablet (175 mcg total) by mouth daily.   losartan (COZAAR) 25 MG tablet TAKE 1 TABLET BY MOUTH DAILY   Misc. Devices (BARIATRIC ROLLATOR) MISC Use as needed   Multiple Vitamins-Minerals (PRESERVISION AREDS PO) Take 1 tablet by mouth daily.   nitroGLYCERIN (NITROSTAT) 0.4 MG SL tablet Place 1 tablet (0.4 mg total) under the tongue every 5 (five) minutes x 3 doses as needed for chest pain.   omeprazole (PRILOSEC) 40 MG capsule TAKE 1 CAPSULE TWICE A DAY   prochlorperazine (COMPAZINE) 10 MG tablet Take 10 mg by mouth every 4 (four) hours as needed.   THICK-IT POWD See admin instructions.   WIXELA INHUB 100-50 MCG/ACT AEPB Inhale  1 puff into the lungs 2 (two) times daily.    Allergies: Pravachol [pravastatin]  Social History   Tobacco Use   Smoking status: Never   Smokeless tobacco: Never  Vaping Use   Vaping Use: Never used  Substance Use Topics   Alcohol use: No   Drug use: No    Family History  Problem Relation Age of Onset   Diabetes Mother    Arthritis Mother    Hyperlipidemia Mother    Mental illness Mother    Heart disease Father    Mental illness Sister    Arthritis Maternal Grandmother    Arthritis Maternal Grandfather    Colon cancer Neg Hx    Stomach cancer Neg Hx    Esophageal cancer Neg Hx    Rectal cancer Neg Hx    Colon polyps Neg Hx     Review of Systems: A 12-system review of systems was performed and was negative except as noted in the HPI.  --------------------------------------------------------------------------------------------------  Physical Exam: BP Marland Kitchen)  130/52 (BP Location: Left Arm, Patient Position: Sitting, Cuff Size: Normal)   Pulse 84   Ht 5\' 5"  (1.651 m)   Wt 136 lb 2 oz (61.7 kg)   BMI 22.65 kg/m   General:  NAD.  Seated in a wheelchair. Neck: No JVD or HJR. Lungs: Clear to auscultation bilaterally without wheezes or crackles. Heart: Regular rate and rhythm with 2/6 systolic murmur. Abdomen: Soft, nontender, nondistended. Extremities: No lower extremity edema.  EKG:  Normal sinus rhythm with atrial sensed, ventricularly paced rhythm.  Lab Results  Component Value Date   WBC 9.1 05/29/2021   HGB 10.0 (L) 05/29/2021   HCT 32.3 (L) 05/29/2021   MCV 90.2 05/29/2021   PLT 216 05/29/2021    Lab Results  Component Value Date   NA 148 (H) 05/29/2021   K 3.4 (L) 05/29/2021   CL 115 (H) 05/29/2021   CO2 25 05/29/2021   BUN 35 (H) 05/29/2021   CREATININE 1.22 (H) 05/29/2021   GLUCOSE 68 (L) 05/29/2021   ALT 17 05/23/2021    Lab Results  Component Value Date   CHOL 134 02/13/2021   HDL 47 02/13/2021   LDLCALC 71 02/13/2021   TRIG 82  02/13/2021   CHOLHDL 2.9 02/13/2021    --------------------------------------------------------------------------------------------------  ASSESSMENT AND PLAN: Coronary artery disease with stable angina and hyperlipidemia associated with type 2 diabetes mellitus: Severe single-vessel CAD previously noted by cath.  Amy Calhoun has occasional episodes of chest pain.  Given that her RCA disease not ammenable to PCI and her advanced age with ongoing hospice care, we have agreed to defer further testing.  We will continue Imdur and as needed SL NTG for pain control.  If she has severe persistent chest pain, use of morphine managed by hospice would not be unreasonable.  Continue ezetimibe for now, given history of statin intolerance and ongoing hospice care.  Complete heart block: PPM functioning appropriately.  Continue ongoing follow-up through the device clinic.  Follow-up: Return to clinic in 6 months.  Yvonne Kendall, MD 05/10/2022 2:59 PM

## 2022-05-12 ENCOUNTER — Encounter: Payer: Self-pay | Admitting: Internal Medicine

## 2022-05-21 NOTE — Progress Notes (Signed)
Remote pacemaker transmission.   

## 2022-06-26 ENCOUNTER — Emergency Department

## 2022-06-26 ENCOUNTER — Encounter: Payer: Self-pay | Admitting: Emergency Medicine

## 2022-06-26 ENCOUNTER — Other Ambulatory Visit: Payer: Self-pay

## 2022-06-26 ENCOUNTER — Emergency Department
Admission: EM | Admit: 2022-06-26 | Discharge: 2022-06-26 | Disposition: A | Discharge status: Home / Self Care | Attending: Emergency Medicine | Admitting: Emergency Medicine

## 2022-06-26 DIAGNOSIS — W01198A Fall on same level from slipping, tripping and stumbling with subsequent striking against other object, initial encounter: Secondary | ICD-10-CM | POA: Insufficient documentation

## 2022-06-26 DIAGNOSIS — N189 Chronic kidney disease, unspecified: Secondary | ICD-10-CM | POA: Insufficient documentation

## 2022-06-26 DIAGNOSIS — I129 Hypertensive chronic kidney disease with stage 1 through stage 4 chronic kidney disease, or unspecified chronic kidney disease: Secondary | ICD-10-CM | POA: Diagnosis not present

## 2022-06-26 DIAGNOSIS — E1122 Type 2 diabetes mellitus with diabetic chronic kidney disease: Secondary | ICD-10-CM | POA: Diagnosis not present

## 2022-06-26 DIAGNOSIS — S0990XA Unspecified injury of head, initial encounter: Secondary | ICD-10-CM | POA: Diagnosis present

## 2022-06-26 DIAGNOSIS — W19XXXA Unspecified fall, initial encounter: Secondary | ICD-10-CM

## 2022-06-26 DIAGNOSIS — Y9301 Activity, walking, marching and hiking: Secondary | ICD-10-CM | POA: Insufficient documentation

## 2022-06-26 DIAGNOSIS — F0390 Unspecified dementia without behavioral disturbance: Secondary | ICD-10-CM | POA: Insufficient documentation

## 2022-06-26 LAB — CBC
HCT: 36.4 % (ref 36.0–46.0)
Hemoglobin: 11.5 g/dL — ABNORMAL LOW (ref 12.0–15.0)
MCH: 29.5 pg (ref 26.0–34.0)
MCHC: 31.6 g/dL (ref 30.0–36.0)
MCV: 93.3 fL (ref 80.0–100.0)
Platelets: 243 10*3/uL (ref 150–400)
RBC: 3.9 MIL/uL (ref 3.87–5.11)
RDW: 12.6 % (ref 11.5–15.5)
WBC: 6.5 10*3/uL (ref 4.0–10.5)
nRBC: 0 % (ref 0.0–0.2)

## 2022-06-26 LAB — BASIC METABOLIC PANEL
Anion gap: 12 (ref 5–15)
BUN: 28 mg/dL — ABNORMAL HIGH (ref 8–23)
CO2: 25 mmol/L (ref 22–32)
Calcium: 8.1 mg/dL — ABNORMAL LOW (ref 8.9–10.3)
Chloride: 96 mmol/L — ABNORMAL LOW (ref 98–111)
Creatinine, Ser: 1.42 mg/dL — ABNORMAL HIGH (ref 0.44–1.00)
GFR, Estimated: 35 mL/min — ABNORMAL LOW (ref >60)
Glucose, Bld: 235 mg/dL — ABNORMAL HIGH (ref 70–99)
Potassium: 4 mmol/L (ref 3.5–5.1)
Sodium: 133 mmol/L — ABNORMAL LOW (ref 135–145)

## 2022-06-26 NOTE — ED Notes (Signed)
Patient lying on stretcher at this time. Patient eyes are open. Resp even and unlabored. Skin PWD. Nad noted at this time

## 2022-06-26 NOTE — ED Notes (Signed)
EMS called to transport pt back home at 645p med necessity and demographic sheet here on A side, will need to have d/c instructions from nurse when EMS arrives

## 2022-06-26 NOTE — ED Notes (Signed)
Dr. Larinda Buttery at bedside for evaluation.

## 2022-06-26 NOTE — ED Provider Notes (Signed)
[facility] Provider Note    Event Date/Time   First MD Initiated Contact with Patient 06/26/22 1535     (approximate)   History   Chief Complaint Fall and Head Injury   HPI  Amy Calhoun is a 89 y.o. female with past medical history of hypertension, diabetes, CKD, and cognitive decline who presents to the ED following fall.  Patient's daughter states that she was pushing patient in her walker earlier this morning when they ran into the curb, causing patient to fall out of the walker.  She struck her head on the ground but did not lose consciousness and does not take any blood thinners.  Family became concerned when patient was slower to respond than usual.  They now state that she seems to have returned to her baseline and is acting normally.  Patient currently complains of left arm pain, which family states is chronic.  She denies any headache or neck pain.     Physical Exam   Triage Vital Signs: ED Triage Vitals  Enc Vitals Group     BP 06/26/22 1530 (!) 139/57     Pulse Rate 06/26/22 1530 71     Resp 06/26/22 1530 10     Temp 06/26/22 1545 97.9 F (36.6 C)     Temp Source 06/26/22 1545 Oral     SpO2 06/26/22 1530 98 %     Weight 06/26/22 1531 145 lb (65.8 kg)     Height 06/26/22 1531 5\' 5"  (1.651 m)     Head Circumference --      Peak Flow --      Pain Score --      Pain Loc --      Pain Edu? --      Excl. in GC? --     Most recent vital signs: Vitals:   06/26/22 1630 06/26/22 1700  BP: (!) 138/54 138/60  Pulse: 74 75  Resp: 14 18  Temp:    SpO2: 100% 98%    Constitutional: Awake and alert. Eyes: Conjunctivae are normal. Head: Atraumatic. Nose: No congestion/rhinnorhea. Mouth/Throat: Mucous membranes are moist.  Neck: No midline cervical spine tenderness to palpation. Cardiovascular: Normal rate, regular rhythm. Grossly normal heart sounds.  2+ radial pulses bilaterally. Respiratory: Normal respiratory effort.  No  retractions. Lungs CTAB.  No chest wall tenderness to palpation. Gastrointestinal: Soft and nontender. No distention. Musculoskeletal: No lower extremity tenderness nor edema.  No upper extremity bony tenderness to palpation, range of motion intact to left upper extremity without pain. Neurologic:  Normal speech and language. No gross focal neurologic deficits are appreciated.    ED Results / Procedures / Treatments   Labs (all labs ordered are listed, but only abnormal results are displayed) Labs Reviewed  CBC - Abnormal; Notable for the following components:      Result Value   Hemoglobin 11.5 (*)    All other components within normal limits  BASIC METABOLIC PANEL - Abnormal; Notable for the following components:   Sodium 133 (*)    Chloride 96 (*)    Glucose, Bld 235 (*)    BUN 28 (*)    Creatinine, Ser 1.42 (*)    Calcium 8.1 (*)    GFR, Estimated 35 (*)    All other components within normal limits     EKG  ED ECG REPORT I, Chesley Noon, the attending physician, personally viewed and interpreted this ECG.   Date: 06/26/2022  EKG Time: 15:35  Rate: 72  Rhythm: normal sinus rhythm  Axis: LAD  Intervals:left bundle branch block  ST&T Change: None  RADIOLOGY CT head reviewed and interpreted by me with no hemorrhage or midline shift.  PROCEDURES:  Critical Care performed: No  Procedures   MEDICATIONS ORDERED IN ED: Medications - No data to display   IMPRESSION / MDM / ASSESSMENT AND PLAN / ED COURSE  I reviewed the triage vital signs and the nursing notes.                              89 y.o. female with past medical history of hypertension, diabetes, CKD, and cognitive decline who presents to the ED following fall with subsequent decreased responsiveness, now back to her baseline.  Patient's presentation is most consistent with acute presentation with potential threat to life or bodily function.  Differential diagnosis includes, but is not limited to,  intracranial injury, cervical spine injury, arm injury.  Patient well-appearing and in no acute distress, vital signs are unremarkable.  She is awake and alert with no focal neurologic deficits.  She complains of pain in her left arm but this is a chronic complaint per family.  No evidence of bony injury to her left arm.  We will check CT head and cervical spine, basic labs are reassuring with no significant anemia, leukocytosis, lecture abnormality, or AKI.  EKG shows no evidence of arrhythmia or ischemia.  CT head and cervical spine are negative for acute traumatic injury, patient does have infiltrates concerning for pulmonary edema on CT cervical spine but patient does not have any difficulty breathing and family does not desire further intervention given her hospice status.  Patient appropriate for discharge home, will continue home hospice care.  Patient and family agree with plan.      FINAL CLINICAL IMPRESSION(S) / ED DIAGNOSES   Final diagnoses:  Fall, initial encounter  Injury of head, initial encounter     Rx / DC Orders   ED Discharge Orders     None        Note:  This document was prepared using Dragon voice recognition software and may include unintentional dictation errors.   Chesley Noon, MD 06/26/22 1755

## 2022-06-26 NOTE — ED Triage Notes (Addendum)
Pt via [facility] from home. Pt was walking outside and had a mechanical fall outside and hit her head on the doormat. Family states she was unresponsive for 45 mins, when pt woke back up family reports she is more altered and sleepy. Pt does have a hx of dementia. Pupils are slow to react. Denies any hematoma or laceration. Denies blood thinners. Denies NV. EMS report 138/74, 286 CBG, 97.8 temp, 70 HR paced, 98% on RA. Pt is a Hospice pt, pt is responsive to voice.

## 2022-07-11 LAB — CUP PACEART REMOTE DEVICE CHECK
Battery Remaining Longevity: 101 mo
Battery Remaining Percentage: 83 %
Battery Voltage: 3.01 V
Brady Statistic AP VP Percent: 1 %
Brady Statistic AP VS Percent: 1 %
Brady Statistic AS VP Percent: 99 %
Brady Statistic AS VS Percent: 1 %
Brady Statistic RA Percent Paced: 1 %
Brady Statistic RV Percent Paced: 99 %
Date Time Interrogation Session: 20240626020014
Implantable Lead Connection Status: 753985
Implantable Lead Connection Status: 753985
Implantable Lead Implant Date: 20220928
Implantable Lead Implant Date: 20220928
Implantable Lead Location: 753859
Implantable Lead Location: 753860
Implantable Pulse Generator Implant Date: 20220928
Lead Channel Impedance Value: 410 Ohm
Lead Channel Impedance Value: 590 Ohm
Lead Channel Pacing Threshold Amplitude: 0.75 V
Lead Channel Pacing Threshold Amplitude: 1.125 V
Lead Channel Pacing Threshold Pulse Width: 0.4 ms
Lead Channel Pacing Threshold Pulse Width: 0.4 ms
Lead Channel Sensing Intrinsic Amplitude: 11.8 mV
Lead Channel Sensing Intrinsic Amplitude: 2.1 mV
Lead Channel Setting Pacing Amplitude: 1.375
Lead Channel Setting Pacing Amplitude: 1.75 V
Lead Channel Setting Pacing Pulse Width: 0.4 ms
Lead Channel Setting Sensing Sensitivity: 4 mV
Pulse Gen Model: 2272
Pulse Gen Serial Number: 3942268

## 2022-07-12 ENCOUNTER — Ambulatory Visit (INDEPENDENT_AMBULATORY_CARE_PROVIDER_SITE_OTHER)

## 2022-07-12 DIAGNOSIS — I442 Atrioventricular block, complete: Secondary | ICD-10-CM

## 2022-07-31 NOTE — Progress Notes (Signed)
 Remote pacemaker transmission.   

## 2022-08-08 ENCOUNTER — Ambulatory Visit: Admitting: Internal Medicine

## 2022-08-08 ENCOUNTER — Encounter: Payer: Self-pay | Admitting: Internal Medicine

## 2022-08-08 VITALS — BP 128/74 | HR 76 | Ht 65.0 in | Wt 137.0 lb

## 2022-08-08 DIAGNOSIS — Z794 Long term (current) use of insulin: Secondary | ICD-10-CM

## 2022-08-08 DIAGNOSIS — E039 Hypothyroidism, unspecified: Secondary | ICD-10-CM | POA: Diagnosis not present

## 2022-08-08 DIAGNOSIS — E1165 Type 2 diabetes mellitus with hyperglycemia: Secondary | ICD-10-CM | POA: Diagnosis not present

## 2022-08-08 LAB — POCT GLYCOSYLATED HEMOGLOBIN (HGB A1C): Hemoglobin A1C: 8.9 % — AB (ref 4.0–5.6)

## 2022-08-08 NOTE — Progress Notes (Unsigned)
Name: Amy Calhoun  Age/ Sex: 89 y.o., female   MRN/ DOB: [id number], 03-07-1928     PCP: Marletta Lor, NP   Reason for Endocrinology Evaluation: Type 2 Diabetes Mellitus  Initial Endocrine Consultative Visit: 04/15/2018    PATIENT IDENTIFIER: Amy Calhoun is a 89 y.o. female with a past medical history of T2DM, Dyslipidemia, Hypothyroidism , heart block S/P pacemaker and HTN . The patient has followed with Endocrinology clinic since 04/15/2018 for consultative assistance with management of her diabetes.   Patient used to be cared for by her other daughter in [place] but daughter had passed due to a motorcycle accident and Amy Calhoun brought mother to [facility] in 2017.   DIABETIC HISTORY:  [name] was diagnosed with DM at age 30. She has been on insulin since her diagnosis.She was briefly on glipizide but this was stopped in March, 2020. Pt did not recall any other oral glycemic agents. Her hemoglobin A1c has ranged from 6.5 % in 2018, peaking at 8.9%  In 2018.  Daughter would like to explore putting mother on glycemic agents, she is concerned about having MDI regimen as mother gets older and worried about future dexterity.   On her initial visit to our clinic, she was on Humulin-N BID, Humulin-R per SS and Tradjenta.  We stopped Tradjenta, started Trulicity in March, 2020, with reduction in insulin doses.   In 09/2018 Replaced Humulin-N with Lantus   SUBJECTIVE:   During the last visit (01/30/2022): A1c 9.2%      Today (08/08/2022): [name] is here with daughter Amy Calhoun for a follow up on diabetes .  She checks her blood sugars multiple times a day through the freestyle libre. The patient has not had hypoglycemic episodes since the last clinic visit.  She had a recent fall in June 2024 She follows with cardiology for AV heart block She continues to follow-up with GI for dysphagia  Denies constipation or diarrhea She continues with dysphagia and choking  Continues to be under  hospice care  She is on seroquel for confusion    HOME ENDOCRINE  REGIMEN:  Lantus 14 units daily  Humalog 12 units TID QAC Humalog 4-5 units QHS if BG > 250 mg/dL  Levothyroxine 416 mcg daily    CONTINUOUS GLUCOSE MONITORING RECORD INTERPRETATION    Dates of Recording: 7/11-7/24/2024  Sensor description:Freestyle Libre  Results statistics:   CGM use % of time 95  Average and SD 231/26.4  Time in range 25%  % Time Above 180 40  % Time above 250 35  % Time Below target 0     Glycemic patterns summary: Bg's optimal overnight and hyperglycemia noted during the day  Hypoglycemic episodes occurred N/A Overnight periods: trends down      HISTORY:  Past Medical History:  Past Medical History:  Diagnosis Date   Allergy    Ambulates with cane    Anemia    At high risk for falls    Cataract    removed both eyes   Chronic kidney disease 08/07/2016   Chronic renal failure, stage III   Coronary artery disease    Severe, diffuse RCA disease by cath (09/2020).  NSTEMI (12/2020) - medical therapy recommended   Depression    Esophageal dysphagia    Gastric ulcer    GERD (gastroesophageal reflux disease)    Hyperlipidemia    Hypertension    Hypothyroidism    IDDM (insulin dependent diabetes mellitus)    Ischemic colitis (HCC)  Neuromuscular disorder (HCC)    neuropathy   Osteoarthritis    Ulcer of esophagus    Past Surgical History:  Past Surgical History:  Procedure Laterality Date   ABDOMINAL HYSTERECTOMY     CATARACT EXTRACTION, BILATERAL     CHOLECYSTECTOMY     COLONOSCOPY  2010   ESOPHAGEAL DILATION  08/07/2016   usually a couple times a year, last time 07/30/16   ESOPHAGOGASTRODUODENOSCOPY (EGD) WITH PROPOFOL N/A 05/23/2021   Procedure: ESOPHAGOGASTRODUODENOSCOPY (EGD) WITH PROPOFOL;  Surgeon: [name], MD;  Location: [facility] ENDOSCOPY;  Service: Gastroenterology;  Laterality: N/A;   GALLBLADDER SURGERY     LEFT HEART CATH AND CORONARY ANGIOGRAPHY N/A  10/11/2020   Procedure: LEFT HEART CATH AND CORONARY ANGIOGRAPHY;  Surgeon: Yvonne Kendall, MD;  Location: [facility] INVASIVE CV LAB;  Service: Cardiovascular;  Laterality: N/A;   LUMBAR LAMINECTOMY/DECOMPRESSION MICRODISCECTOMY N/A 08/10/2016   Procedure: BILATERAL HEMILAMINECTOMY LUMBAR THREE-FOUR,LUMBAR FOUR-FIVE,LEFT LUMBAR FIVE-SACRAL ONE HEMILAMINECTOMY AND DECOMPRESSION;  Surgeon: Coletta Memos, MD;  Location: [facility] OR;  Service: Neurosurgery;  Laterality: N/A;   PACEMAKER IMPLANT N/A 10/12/2020   Procedure: PACEMAKER IMPLANT;  Surgeon: Lanier Prude, MD;  Location: [facility] INVASIVE CV LAB;  Service: Cardiovascular;  Laterality: N/A;   TEMPORARY PACEMAKER N/A 10/11/2020   Procedure: TEMPORARY PACEMAKER;  Surgeon: Yvonne Kendall, MD;  Location: [facility] INVASIVE CV LAB;  Service: Cardiovascular;  Laterality: N/A;   THYROIDECTOMY     UPPER GASTROINTESTINAL ENDOSCOPY     Social History:  reports that she has never smoked. She has never used smokeless tobacco. She reports that she does not drink alcohol and does not use drugs. Family History:  Family History  Problem Relation Age of Onset   Diabetes Mother    Arthritis Mother    Hyperlipidemia Mother    Mental illness Mother    Heart disease Father    Mental illness Sister    Arthritis Maternal Grandmother    Arthritis Maternal Grandfather    Colon cancer Neg Hx    Stomach cancer Neg Hx    Esophageal cancer Neg Hx    Rectal cancer Neg Hx    Colon polyps Neg Hx      HOME MEDICATIONS: Allergies as of 08/08/2022       Reactions   Pravachol [pravastatin] Other (See Comments)   States she refused due to side effects        Medication List        Accurate as of August 08, 2022 12:38 PM. If you have any questions, ask your nurse or doctor.          albuterol (2.5 MG/3ML) 0.083% nebulizer solution Commonly known as: PROVENTIL Take 3 mLs (2.5 mg total) by nebulization every 6 (six) hours as needed for wheezing or shortness of  breath.   albuterol 108 (90 Base) MCG/ACT inhaler Commonly known as: VENTOLIN HFA Inhale 2 puffs into the lungs every 6 (six) hours as needed for wheezing or shortness of breath.   amitriptyline 100 MG tablet Commonly known as: ELAVIL Take 1 tablet (100 mg total) by mouth at bedtime.   ascorbic acid 500 MG tablet Commonly known as: VITAMIN C Take 500 mg by mouth daily.   aspirin EC 81 MG tablet Take 1 tablet (81 mg total) by mouth daily. Swallow whole.   Bariatric Rollator Misc Use as needed   DODEX IJ Inject as directed every 30 (thirty) days.   ezetimibe 10 MG tablet Commonly known as: ZETIA TAKE ONE TABLET BY MOUTH DAILY  famotidine 40 MG tablet Commonly known as: PEPCID Take 1 tablet (40 mg total) by mouth 2 (two) times daily. Take one tablet upon waking in the morning and the other before going to bed.   ferrous sulfate 324 MG Tbec Take 324 mg by mouth daily with breakfast.   FreeStyle Libre 14 Day Sensor Misc 1 Package by Does not apply route every 14 (fourteen) days.   furosemide 20 MG tablet Commonly known as: LASIX Take 1 tablet (20 mg total) by mouth daily.   gabapentin 100 MG capsule Commonly known as: NEURONTIN Take 2 capsules (200 mg total) by mouth 3 (three) times daily.   glucagon 1 MG injection Inject 1 mg into the muscle once as needed for up to 1 dose. Hypoglycemia If she is not tolerating PO.   guaiFENesin 100 MG/5ML liquid Commonly known as: ROBITUSSIN Take 10 mLs by mouth every 4 (four) hours as needed for cough or to loosen phlegm.   insulin lispro 100 UNIT/ML KwikPen Commonly known as: HumaLOG KwikPen Max daily 45 units What changed:  how much to take when to take this additional instructions   Insulin Pen Needle 32G X 4 MM Misc 1 Device by Does not apply route in the morning, at noon, in the evening, and at bedtime.   isosorbide mononitrate 30 MG 24 hr tablet Commonly known as: IMDUR Take 1 tablet (30 mg total) by mouth  daily.   Lantus SoloStar 100 UNIT/ML Solostar Pen Generic drug: insulin glargine Inject 14 Units into the skin at bedtime.   levothyroxine 175 MCG tablet Commonly known as: SYNTHROID Take 1 tablet (175 mcg total) by mouth daily.   losartan 25 MG tablet Commonly known as: COZAAR TAKE 1 TABLET BY MOUTH DAILY   nitroGLYCERIN 0.4 MG SL tablet Commonly known as: NITROSTAT Place 1 tablet (0.4 mg total) under the tongue every 5 (five) minutes x 3 doses as needed for chest pain.   omeprazole 40 MG capsule Commonly known as: PRILOSEC TAKE 1 CAPSULE TWICE A DAY   PRESERVISION AREDS PO Take 1 tablet by mouth daily.   prochlorperazine 10 MG tablet Commonly known as: COMPAZINE Take 10 mg by mouth every 4 (four) hours as needed.   Thick-It Powd Generic drug: STARCH-MALTO DEXTRIN See admin instructions.   Vitamin D 50 MCG (2000 UT) tablet Take 2,000 Units by mouth daily.   Wixela Inhub 100-50 MCG/ACT Aepb Generic drug: fluticasone-salmeterol Inhale 1 puff into the lungs 2 (two) times daily.         OBJECTIVE:   Vital Signs: There were no vitals taken for this visit.  Wt Readings from Last 3 Encounters:  06/26/22 145 lb (65.8 kg)  05/10/22 136 lb 2 oz (61.7 kg)  01/30/22 135 lb 12.8 oz (61.6 kg)     Exam: General: Pt appears well and is in NAD in a wheelchair   Lungs: Clear with good BS bilat   Heart: RRR   Extremities: No pretibial edema.   Dm Foot Exam 08/09/2022  The skin of the feet shows left medial heel ulceration with no infection nor discharge The pedal pulses are 2+ on right and 2+ on left. The sensation is absent to a screening 5.07, 10 gram monofilament bilaterally     DATA REVIEWED:  Lab Results  Component Value Date   HGBA1C 9.2 (A) 01/30/2022   HGBA1C 8.6 (A) 07/26/2021   HGBA1C 9.1 (A) 12/12/2020    Latest Reference Range & Units 01/30/22 09:15  TSH 0.35 - 5.50  uIU/mL 1.52    Latest Reference Range & Units 06/26/22 15:40  Sodium 135 -  145 mmol/L 133 (L)  Potassium 3.5 - 5.1 mmol/L 4.0  Chloride 98 - 111 mmol/L 96 (L)  CO2 22 - 32 mmol/L 25  Glucose 70 - 99 mg/dL 355 (H)  BUN 8 - 23 mg/dL 28 (H)  Creatinine 7.32 - 1.00 mg/dL 2.02 (H)  Calcium 8.9 - 10.3 mg/dL 8.1 (L)  Anion gap 5 - 15  12  GFR, Estimated >60 mL/min 35 (L)  (L): Data is abnormally low (H): Data is abnormally high  ASSESSMENT / PLAN / RECOMMENDATIONS:   1) Insulin-Dependent, Poorly controlled, With CKD III and neuropathic complications - Most recent A1c of 8.9  %. Goal A1c < 8.0%.   -A1c remains above goal -But I do believe with the current circumstances this is the best as we can get her glucose to be controlled -Her overnight BG's are optimal which indicate that she is on the right dose of Lantus -Daughter is a caregiver and has been managing her insulin intake -The patient receives her prandial insulin post meals, as she tends to have dysphagia with choking and at times vomiting -No changes at this time    MEDICATIONS:  Continue  Lantus 14 Units QHS Continue Humalog 12 units TID Continue to give Humalog 4-5 units at bedtime if BG >250 mg/dL Correction Factor: Humalog (BG-130/60)  EDUCATION / INSTRUCTIONS: BG monitoring instructions: Patient is instructed to check her blood sugars 4 times a day, before meals and bedtime. Call [facility] if: BG persistently < 70  I reviewed the Rule of 15 for the treatment of hypoglycemia in detail with the patient. Literature supplied.   2. Hypothyroidism:   -TSH remains within normal range  Medication  Continue levothyroxine 175 mcg daily     F/U in 6 months    Signed electronically by: Lyndle Herrlich, MD  [facility] End[facility]  [facility] [address] [place], [place] [address] Phone: [phone] FAX: [phone]   CC: Marletta Lor, NP [address] [place] [address] Phone: [phone]  Fax: [phone]  Return to  Endocrinology clinic as below: Future Appointments  Date Time Provider Department Center  08/08/2022  2:40 PM Lainey Nelson, Konrad Dolores, MD [facility] None  10/11/2022  7:05 AM [facility] DEVICE REMOTES [facility]  11/14/2022  3:40 PM End, Cristal Deer, MD [facility] None  01/10/2023  7:05 AM [facility] DEVICE REMOTES [facility]  04/11/2023  7:05 AM [facility] DEVICE REMOTES [facility]  07/11/2023  7:05 AM [facility] DEVICE REMOTES [facility]

## 2022-08-08 NOTE — Patient Instructions (Addendum)
-   Continue Lantus 14 Units bedtime  -Continue  Humalog 12 units with each meal  -Bedtime Humalog between 4-5 units if sugar over 250  -Humalog correctional insulin:  Use the scale below to help guide you Blood sugar before meal Number of units to inject  Less than 190 0 unit  191 -  250 1 units  251 -  310 2 units  311 -  370 3 units  371 -  430 4 units  431 -  490 5 units  491 -  550 6 units          HOW TO TREAT LOW BLOOD SUGARS (Blood sugar LESS THAN 70 MG/DL) Please follow the RULE OF 15 for the treatment of hypoglycemia treatment (when your (blood sugars are less than 70 mg/dL)   STEP 1: Take 15 grams of carbohydrates when your blood sugar is low, which includes:  3-4 GLUCOSE TABS  OR 3-4 OZ OF JUICE OR REGULAR SODA OR ONE TUBE OF GLUCOSE GEL    STEP 2: RECHECK blood sugar in 15 MINUTES STEP 3: If your blood sugar is still low at the 15 minute recheck --> then, go back to STEP 1 and treat AGAIN with another 15 grams of carbohydrates.

## 2022-08-09 ENCOUNTER — Encounter: Payer: Self-pay | Admitting: Internal Medicine

## 2022-08-09 LAB — TSH: TSH: 1.35 u[IU]/mL (ref 0.35–5.50)

## 2022-08-09 MED ORDER — LEVOTHYROXINE SODIUM 175 MCG PO TABS: 175.0000 ug | ORAL_TABLET | Freq: Every day | ORAL | 3 refills | Status: DC

## 2022-08-14 ENCOUNTER — Telehealth: Payer: Self-pay

## 2022-08-17 NOTE — Telephone Encounter (Signed)
error 

## 2022-09-03 ENCOUNTER — Telehealth: Payer: Self-pay

## 2022-09-05 ENCOUNTER — Telehealth: Payer: Self-pay

## 2022-09-05 NOTE — Telephone Encounter (Signed)
Patient daughter called to advise that patient has passed away and would like to thank you for taking care of her mom.

## 2022-09-06 NOTE — Telephone Encounter (Signed)
Daughter called stating her mother passed away on 2022/09/09 and no one called her to follow-up and she wants to know what she needs to do with patient's pacemaker equipment.

## 2022-09-07 NOTE — Telephone Encounter (Signed)
I spoke with the patient daughter and a gave her my deepest condolences. However the daughter was upset because the patient died on 10-07-2022 and we did not notify her that the patient passed away. I explained to her that we would not know if the patient passed away unless the patient had an electrical event with her heart. The pacemaker do not pick up the pumping function and it is not a life alert. She informed me that the patient died of Covid. I told her the monitor would not pick up covid. Covid was a respiratory problem not an heart problem. So the monitor will not let us know the patient passed away. I gave her the number to [facility] tech support to order a return kit for the monitor.

## 2022-09-16 NOTE — Telephone Encounter (Signed)
Pt relative called stating she passed away yesterday Sep 13, 2022. I gave the scheduler the number to [facility] tech support so they can get a return kit for the monitor. I canceled all upcoming remotes. I took her out of [place] and marked her deceased in [place] as well.

## 2022-09-16 DEATH — deceased

## 2022-10-23 IMAGING — CR DG CHEST 2V
2 series · 2 of 2 positions shown · non-contrast
Comparison: Chest radiograph dated 12/19/2020.

CLINICAL DATA: Chest pain.

EXAM:
CHEST - 2 VIEW

[chest lat]
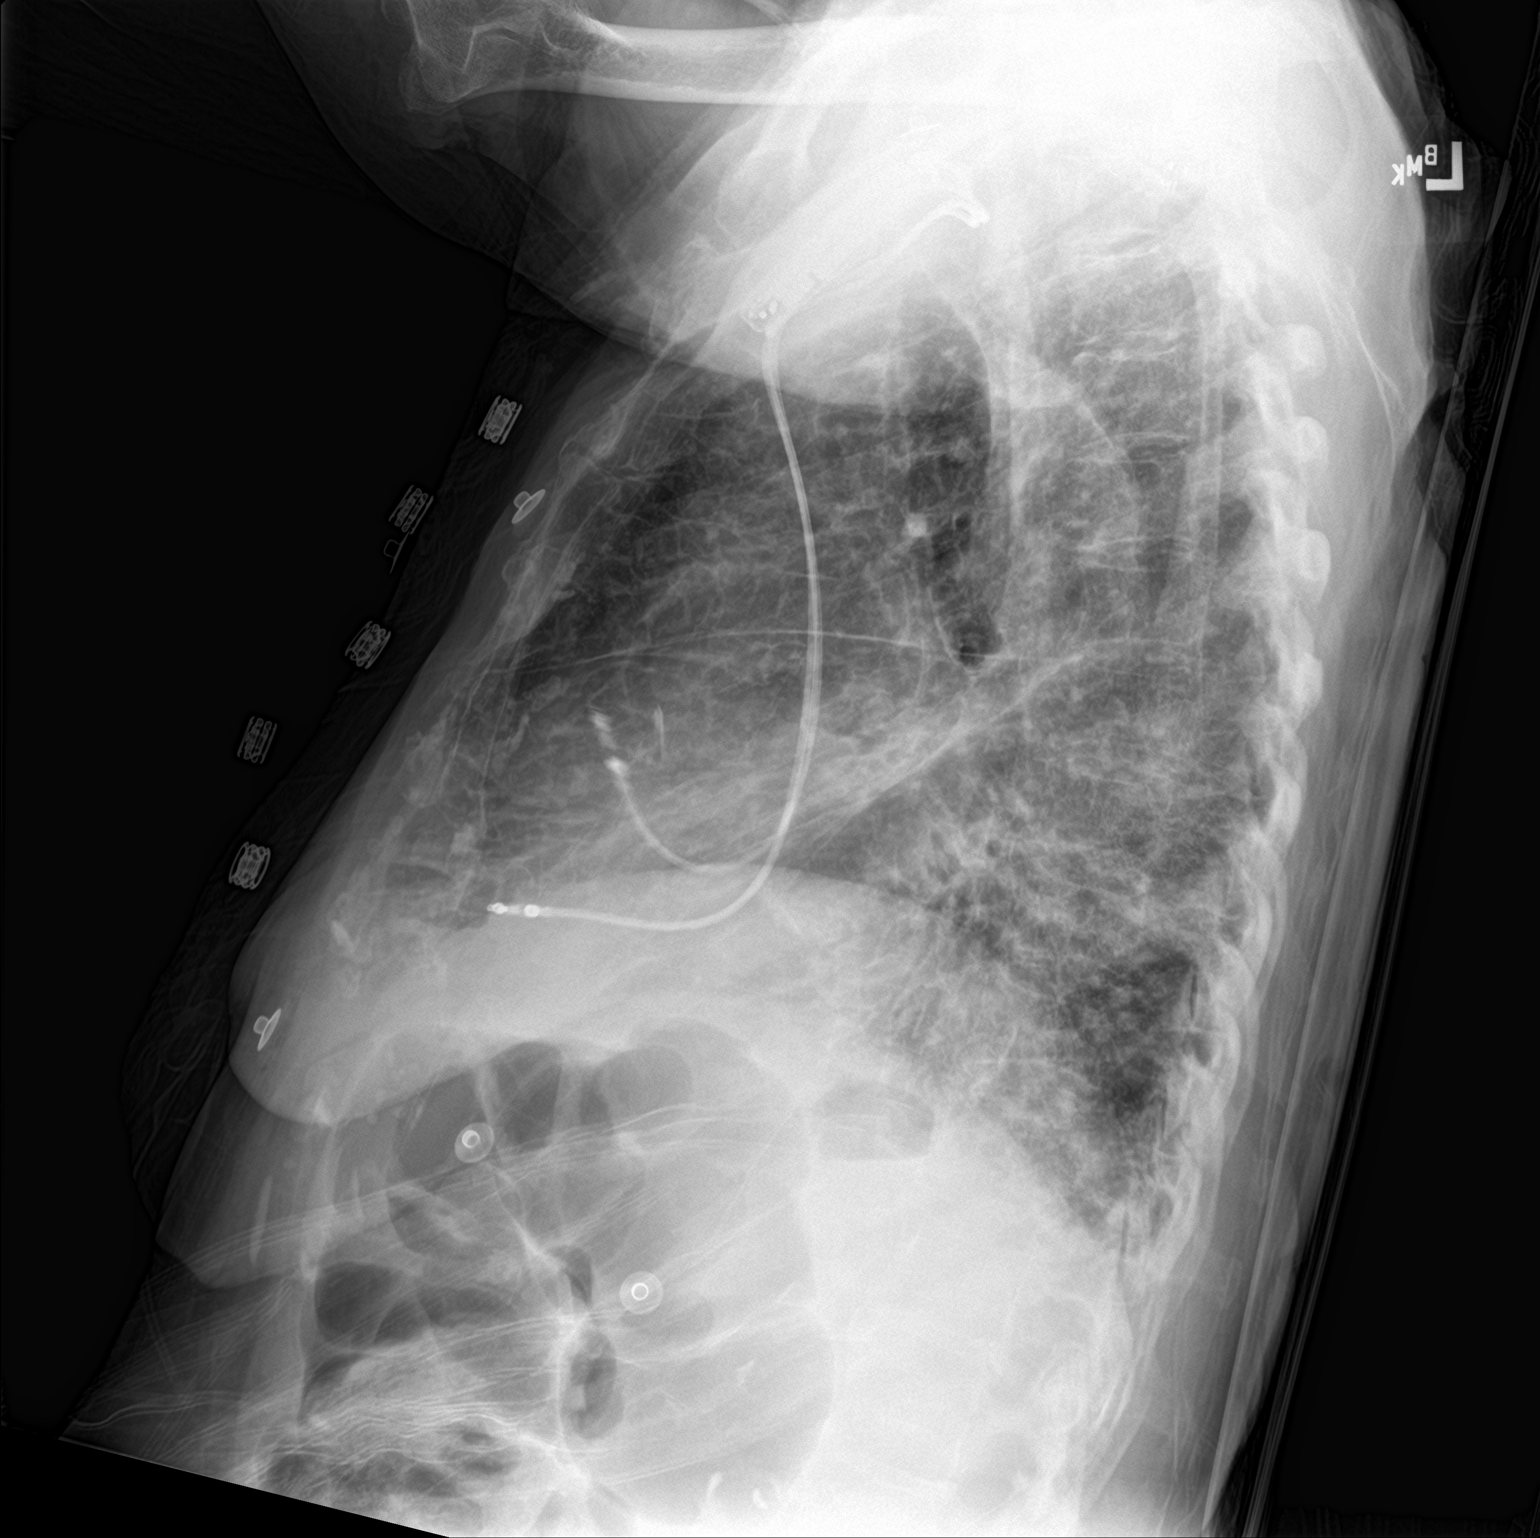

[chest ap]
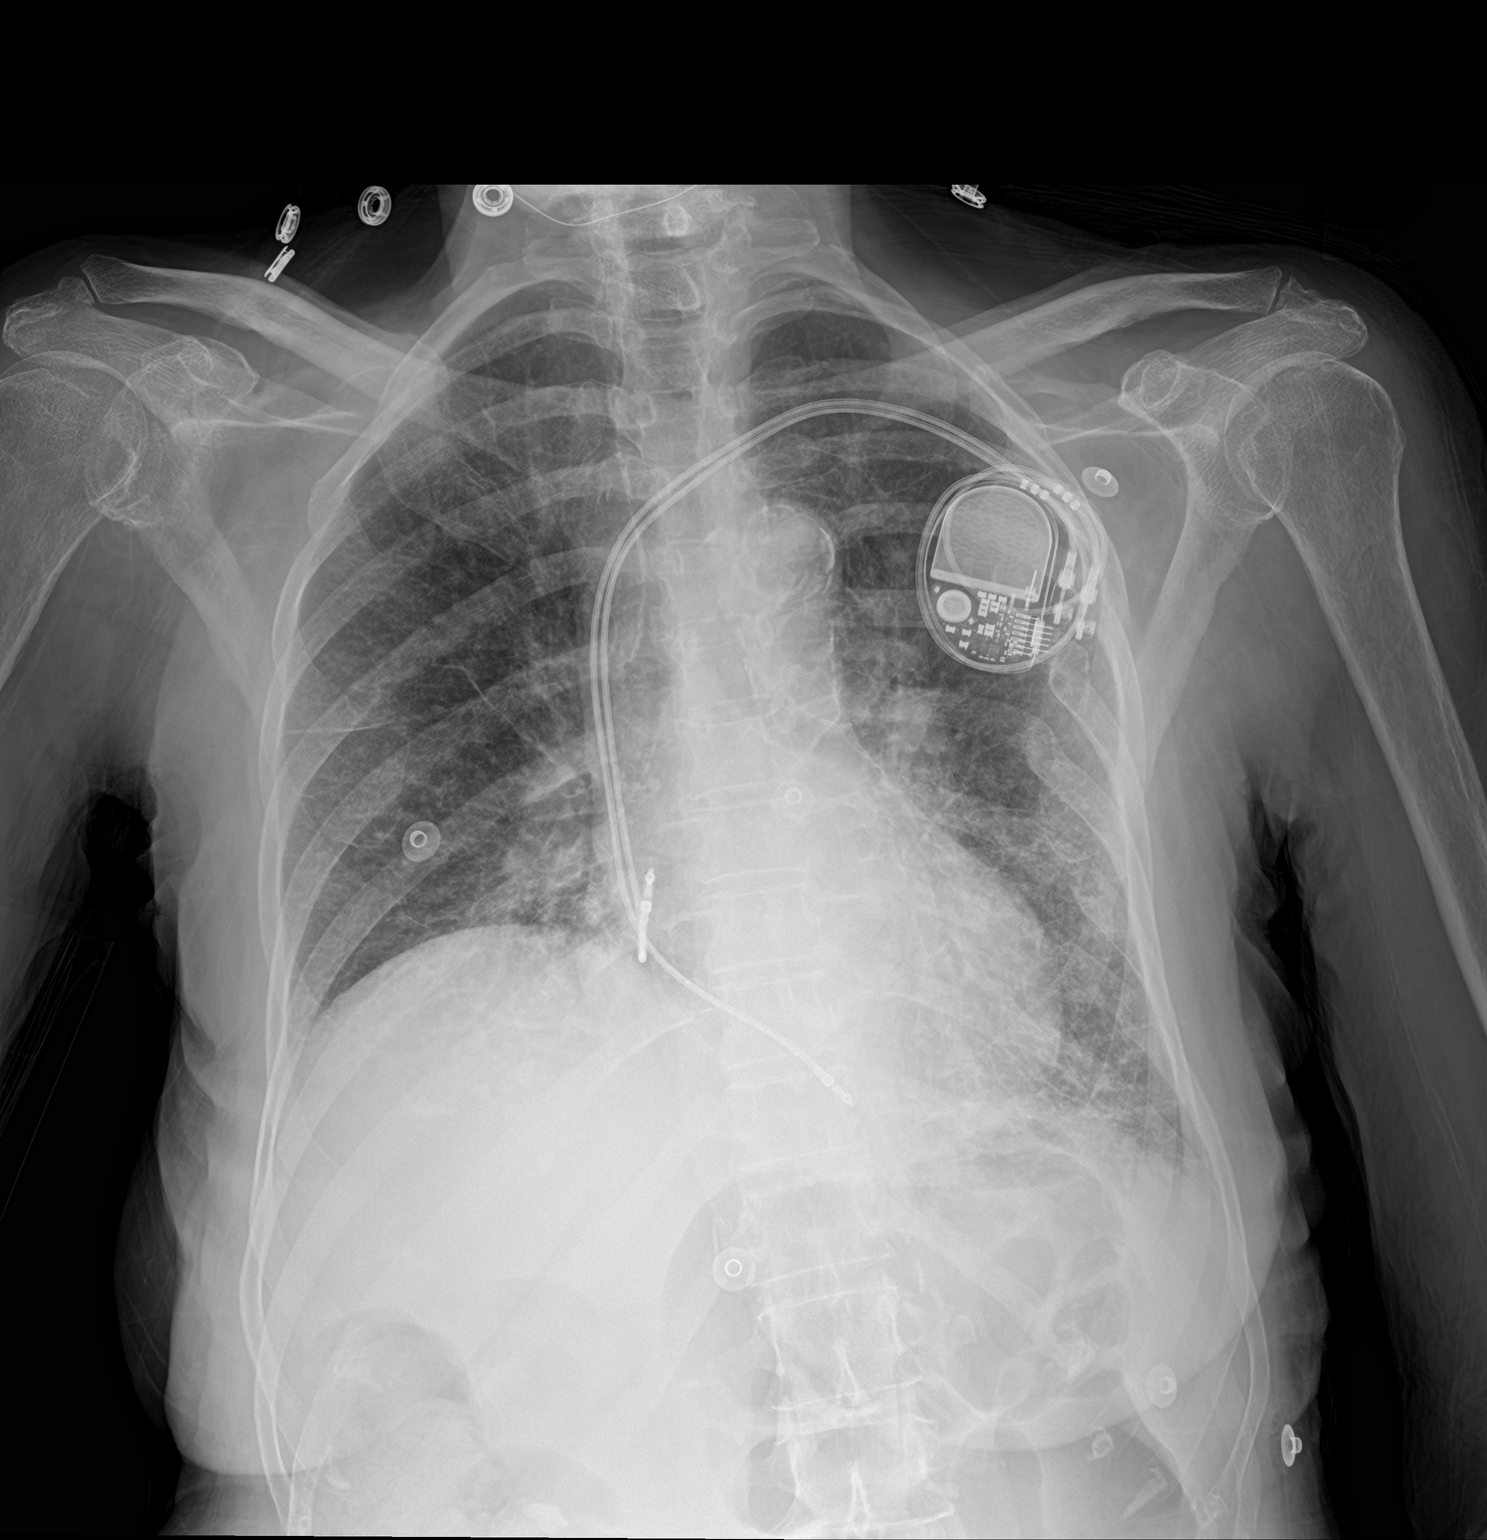

[2 of 2 positions shown; findings below may reference images not displayed]

FINDINGS: Diffuse interstitial coarsening, progressed since the prior
radiograph and may represent mild edema. Atypical pneumonia is not
excluded. Clinical correlation is recommended. There is eventration
of the right hemidiaphragm. No pleural effusion pneumothorax. The
cardiac silhouette is within limits. Atherosclerotic calcification
of the aorta. Left pectoral pacemaker device. Degenerative changes
of the spine. No acute osseous pathology.
IMPRESSION: Diffuse interstitial coarsening may represent mild edema. Atypical
pneumonia is not excluded.

## 2022-10-23 IMAGING — DX DG CHEST 1V PORT
1 series · 1 of 1 positions shown · non-contrast
Comparison: Radiograph 12/27/2020, chest CT 11/05/2020

CLINICAL DATA: Shortness of breath, chest pain

EXAM:
PORTABLE CHEST 1 VIEW

[chest ap]
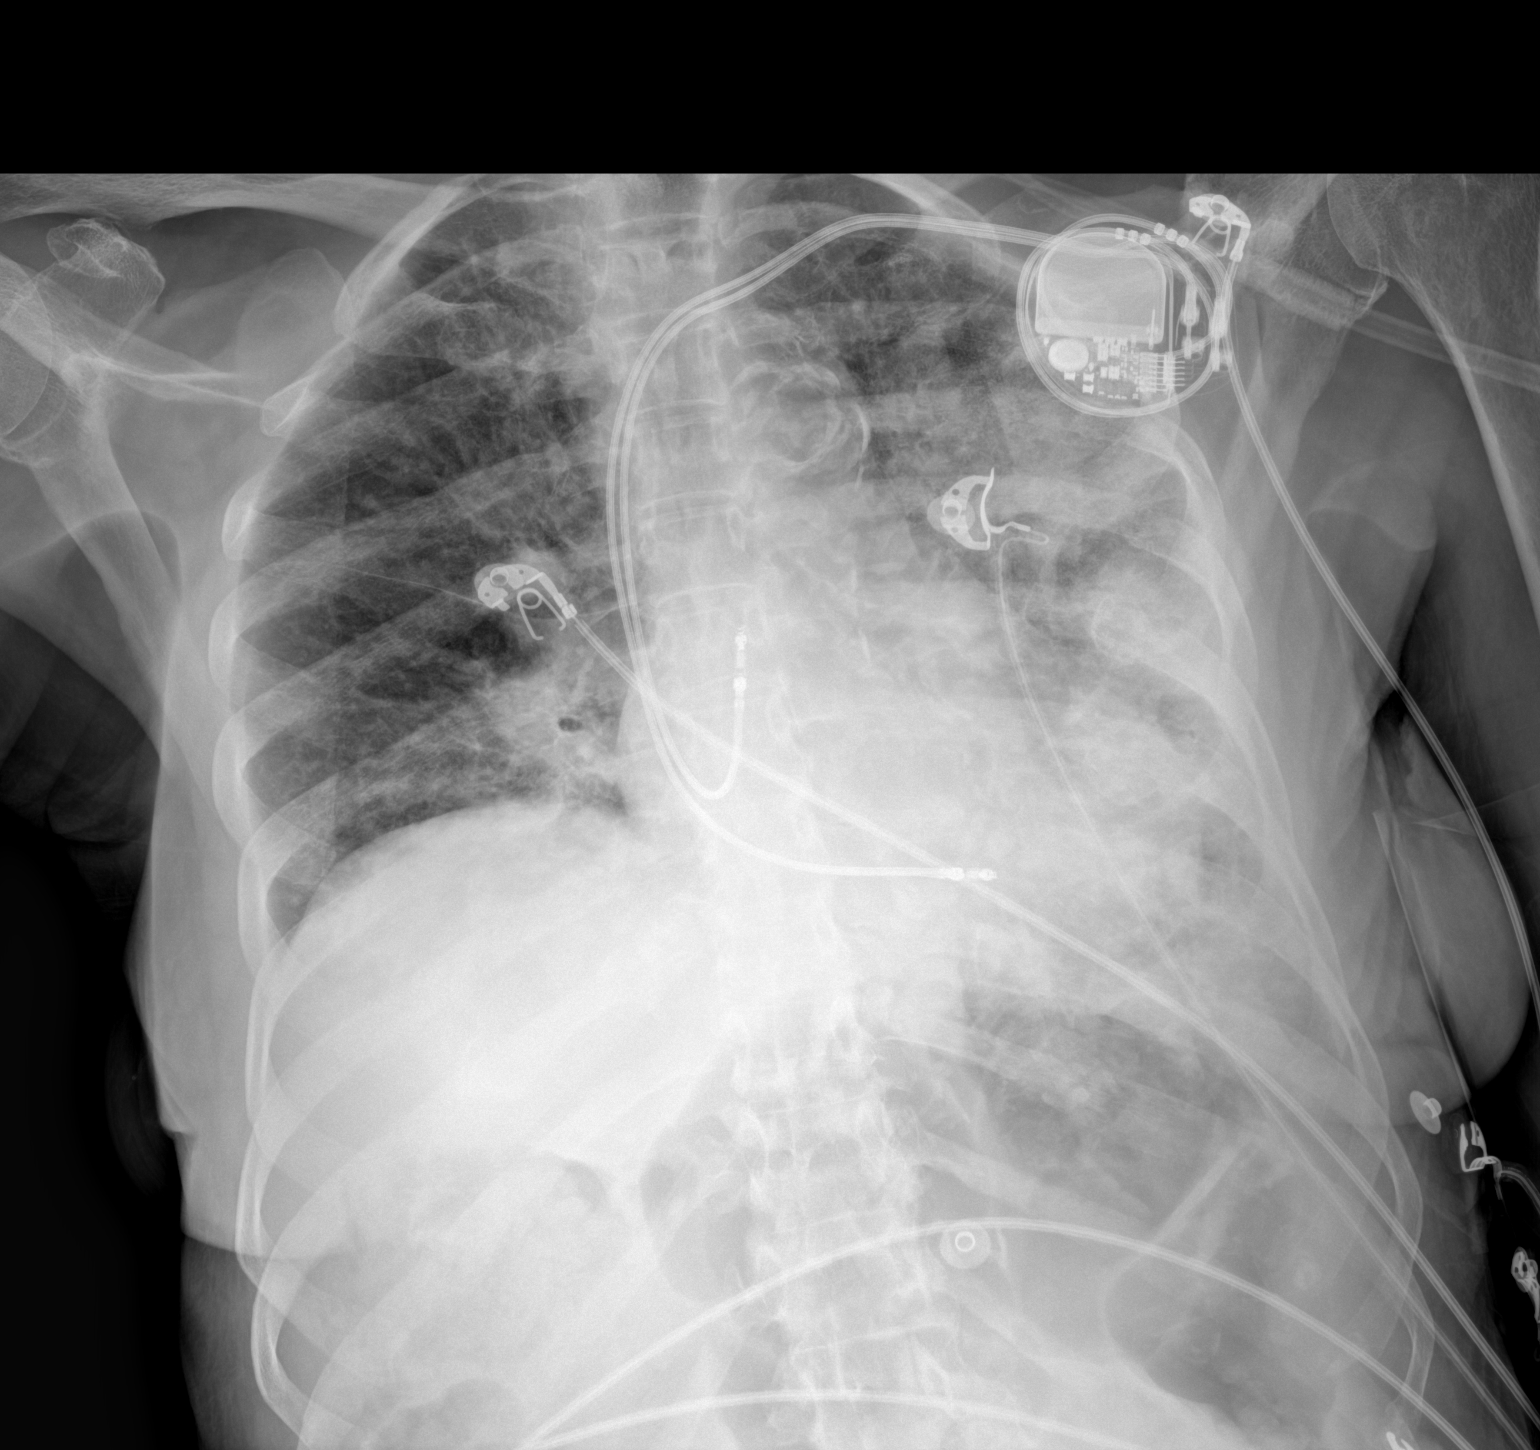

[1 of 1 positions shown; findings below may reference images not displayed]

FINDINGS: Unchanged cardiomediastinal silhouette with dual chamber pacemaker.
Markedly increased interstitial and airspace disease left greater
than right. Probable small left pleural effusion. No visible
pneumothorax.
IMPRESSION: Markedly increased interstitial and airspace disease, left greater
than right, favored to be severe pulmonary edema.

## 2022-11-14 ENCOUNTER — Ambulatory Visit: Payer: Medicare Other | Admitting: Internal Medicine

## 2023-02-11 ENCOUNTER — Telehealth: Payer: Medicare Other | Admitting: Internal Medicine
